# Patient Record
Sex: Female | Born: 1945 | Race: White | Hispanic: No | Smoking: Never smoker
Health system: Southern US, Community
[De-identification: ages and names within clinical notes are randomized; demographics above are authoritative.]

## PROBLEM LIST (undated history)

## (undated) DIAGNOSIS — I10 Essential (primary) hypertension: Secondary | ICD-10-CM

## (undated) DIAGNOSIS — E7800 Pure hypercholesterolemia, unspecified: Secondary | ICD-10-CM

## (undated) DIAGNOSIS — J189 Pneumonia, unspecified organism: Secondary | ICD-10-CM

## (undated) DIAGNOSIS — K219 Gastro-esophageal reflux disease without esophagitis: Secondary | ICD-10-CM

## (undated) DIAGNOSIS — R351 Nocturia: Secondary | ICD-10-CM

## (undated) DIAGNOSIS — M1990 Unspecified osteoarthritis, unspecified site: Secondary | ICD-10-CM

## (undated) DIAGNOSIS — G479 Sleep disorder, unspecified: Secondary | ICD-10-CM

## (undated) DIAGNOSIS — F419 Anxiety disorder, unspecified: Secondary | ICD-10-CM

## (undated) DIAGNOSIS — R0600 Dyspnea, unspecified: Secondary | ICD-10-CM

## (undated) DIAGNOSIS — J45909 Unspecified asthma, uncomplicated: Secondary | ICD-10-CM

## (undated) DIAGNOSIS — K649 Unspecified hemorrhoids: Secondary | ICD-10-CM

## (undated) DIAGNOSIS — E119 Type 2 diabetes mellitus without complications: Secondary | ICD-10-CM

## (undated) DIAGNOSIS — M2550 Pain in unspecified joint: Secondary | ICD-10-CM

## (undated) DIAGNOSIS — C801 Malignant (primary) neoplasm, unspecified: Secondary | ICD-10-CM

## (undated) DIAGNOSIS — D649 Anemia, unspecified: Secondary | ICD-10-CM

## (undated) DIAGNOSIS — E039 Hypothyroidism, unspecified: Secondary | ICD-10-CM

## (undated) HISTORY — PX: BACK SURGERY: SHX140

## (undated) HISTORY — PX: THYROIDECTOMY, PARTIAL: SHX18

## (undated) HISTORY — PX: ABDOMINAL HYSTERECTOMY: SHX81

## (undated) HISTORY — PX: PARS PLANA REPAIR OF RETINAL DEATACHMENT: SHX2165

## (undated) HISTORY — PX: CHOLECYSTECTOMY: SHX55

## (undated) HISTORY — PX: EYE SURGERY: SHX253

---

## 1998-11-07 ENCOUNTER — Other Ambulatory Visit: Admission: RE | Admit: 1998-11-07 | Discharge: 1998-11-07 | Payer: Self-pay | Admitting: Obstetrics & Gynecology

## 1999-10-07 ENCOUNTER — Encounter: Payer: Self-pay | Admitting: Obstetrics and Gynecology

## 1999-10-07 ENCOUNTER — Encounter: Admission: RE | Admit: 1999-10-07 | Discharge: 1999-10-07 | Payer: Self-pay | Admitting: Obstetrics and Gynecology

## 1999-10-31 ENCOUNTER — Other Ambulatory Visit: Admission: RE | Admit: 1999-10-31 | Discharge: 1999-10-31 | Payer: Self-pay | Admitting: Obstetrics and Gynecology

## 2000-10-08 ENCOUNTER — Encounter: Payer: Self-pay | Admitting: Obstetrics and Gynecology

## 2000-10-08 ENCOUNTER — Encounter: Admission: RE | Admit: 2000-10-08 | Discharge: 2000-10-08 | Payer: Self-pay | Admitting: Obstetrics and Gynecology

## 2000-11-23 ENCOUNTER — Other Ambulatory Visit: Admission: RE | Admit: 2000-11-23 | Discharge: 2000-11-23 | Payer: Self-pay | Admitting: Obstetrics and Gynecology

## 2000-12-29 ENCOUNTER — Encounter: Payer: Self-pay | Admitting: Neurosurgery

## 2000-12-29 ENCOUNTER — Encounter: Admission: RE | Admit: 2000-12-29 | Discharge: 2000-12-29 | Payer: Self-pay | Admitting: Neurosurgery

## 2001-06-13 ENCOUNTER — Emergency Department (HOSPITAL_COMMUNITY): Admission: EM | Admit: 2001-06-13 | Discharge: 2001-06-13 | Payer: Self-pay | Admitting: Emergency Medicine

## 2001-06-13 ENCOUNTER — Encounter: Payer: Self-pay | Admitting: Emergency Medicine

## 2001-09-01 ENCOUNTER — Ambulatory Visit (HOSPITAL_COMMUNITY): Admission: RE | Admit: 2001-09-01 | Discharge: 2001-09-01 | Payer: Self-pay | Admitting: *Deleted

## 2001-09-01 ENCOUNTER — Encounter (INDEPENDENT_AMBULATORY_CARE_PROVIDER_SITE_OTHER): Payer: Self-pay | Admitting: *Deleted

## 2001-10-11 ENCOUNTER — Encounter: Admission: RE | Admit: 2001-10-11 | Discharge: 2001-10-11 | Payer: Self-pay | Admitting: Obstetrics and Gynecology

## 2001-10-11 ENCOUNTER — Encounter: Payer: Self-pay | Admitting: Obstetrics and Gynecology

## 2001-11-24 ENCOUNTER — Other Ambulatory Visit: Admission: RE | Admit: 2001-11-24 | Discharge: 2001-11-24 | Payer: Self-pay | Admitting: Obstetrics and Gynecology

## 2002-07-14 HISTORY — PX: CERVICAL FUSION: SHX112

## 2002-10-18 ENCOUNTER — Encounter: Payer: Self-pay | Admitting: Obstetrics and Gynecology

## 2002-10-18 ENCOUNTER — Encounter: Admission: RE | Admit: 2002-10-18 | Discharge: 2002-10-18 | Payer: Self-pay | Admitting: Obstetrics and Gynecology

## 2002-11-04 ENCOUNTER — Encounter: Payer: Self-pay | Admitting: Neurosurgery

## 2002-11-07 ENCOUNTER — Inpatient Hospital Stay (HOSPITAL_COMMUNITY): Admission: RE | Admit: 2002-11-07 | Discharge: 2002-11-08 | Payer: Self-pay | Admitting: Neurosurgery

## 2002-11-07 ENCOUNTER — Encounter: Payer: Self-pay | Admitting: Neurosurgery

## 2003-11-13 ENCOUNTER — Encounter: Admission: RE | Admit: 2003-11-13 | Discharge: 2003-11-13 | Payer: Self-pay | Admitting: Obstetrics and Gynecology

## 2003-11-15 ENCOUNTER — Encounter: Admission: RE | Admit: 2003-11-15 | Discharge: 2003-11-15 | Payer: Self-pay | Admitting: Obstetrics and Gynecology

## 2003-11-17 ENCOUNTER — Observation Stay (HOSPITAL_COMMUNITY): Admission: RE | Admit: 2003-11-17 | Discharge: 2003-11-18 | Payer: Self-pay | Admitting: Specialist

## 2004-12-06 ENCOUNTER — Encounter: Admission: RE | Admit: 2004-12-06 | Discharge: 2004-12-06 | Payer: Self-pay | Admitting: Obstetrics and Gynecology

## 2005-04-19 ENCOUNTER — Emergency Department (HOSPITAL_COMMUNITY): Admission: EM | Admit: 2005-04-19 | Discharge: 2005-04-19 | Payer: Self-pay | Admitting: Emergency Medicine

## 2005-09-23 ENCOUNTER — Encounter: Admission: RE | Admit: 2005-09-23 | Discharge: 2005-09-23 | Payer: Self-pay | Admitting: Occupational Medicine

## 2005-10-02 ENCOUNTER — Encounter: Admission: RE | Admit: 2005-10-02 | Discharge: 2005-12-31 | Payer: Self-pay | Admitting: *Deleted

## 2005-12-19 ENCOUNTER — Encounter: Admission: RE | Admit: 2005-12-19 | Discharge: 2005-12-19 | Payer: Self-pay | Admitting: Obstetrics and Gynecology

## 2006-02-19 ENCOUNTER — Encounter: Admission: RE | Admit: 2006-02-19 | Discharge: 2006-02-19 | Payer: Self-pay | Admitting: Family Medicine

## 2006-04-23 ENCOUNTER — Encounter: Admission: RE | Admit: 2006-04-23 | Discharge: 2006-07-22 | Payer: Self-pay | Admitting: Family Medicine

## 2006-12-28 ENCOUNTER — Encounter: Admission: RE | Admit: 2006-12-28 | Discharge: 2006-12-28 | Payer: Self-pay | Admitting: Obstetrics and Gynecology

## 2007-11-19 ENCOUNTER — Encounter: Admission: RE | Admit: 2007-11-19 | Discharge: 2007-11-19 | Payer: Self-pay | Admitting: Gastroenterology

## 2007-12-30 ENCOUNTER — Encounter: Admission: RE | Admit: 2007-12-30 | Discharge: 2007-12-30 | Payer: Self-pay | Admitting: Family Medicine

## 2008-01-31 ENCOUNTER — Emergency Department (HOSPITAL_COMMUNITY): Admission: EM | Admit: 2008-01-31 | Discharge: 2008-01-31 | Payer: Self-pay | Admitting: Emergency Medicine

## 2008-05-05 ENCOUNTER — Encounter: Admission: RE | Admit: 2008-05-05 | Discharge: 2008-05-05 | Payer: Self-pay | Admitting: Cardiology

## 2008-05-12 ENCOUNTER — Ambulatory Visit (HOSPITAL_COMMUNITY): Admission: RE | Admit: 2008-05-12 | Discharge: 2008-05-12 | Payer: Self-pay | Admitting: Cardiology

## 2009-01-01 ENCOUNTER — Encounter: Admission: RE | Admit: 2009-01-01 | Discharge: 2009-01-01 | Payer: Self-pay | Admitting: Family Medicine

## 2010-01-02 ENCOUNTER — Encounter: Admission: RE | Admit: 2010-01-02 | Discharge: 2010-01-02 | Payer: Self-pay | Admitting: Family Medicine

## 2010-01-09 ENCOUNTER — Encounter: Admission: RE | Admit: 2010-01-09 | Discharge: 2010-01-09 | Payer: Self-pay | Admitting: Family Medicine

## 2010-08-25 ENCOUNTER — Emergency Department (HOSPITAL_COMMUNITY): Payer: No Typology Code available for payment source

## 2010-08-25 ENCOUNTER — Emergency Department (HOSPITAL_COMMUNITY)
Admission: EM | Admit: 2010-08-25 | Discharge: 2010-08-25 | Disposition: A | Discharge status: Home / Self Care | Payer: No Typology Code available for payment source | Attending: Emergency Medicine | Admitting: Emergency Medicine

## 2010-08-25 DIAGNOSIS — E039 Hypothyroidism, unspecified: Secondary | ICD-10-CM | POA: Insufficient documentation

## 2010-08-25 DIAGNOSIS — R071 Chest pain on breathing: Secondary | ICD-10-CM | POA: Insufficient documentation

## 2010-08-25 DIAGNOSIS — F329 Major depressive disorder, single episode, unspecified: Secondary | ICD-10-CM | POA: Insufficient documentation

## 2010-08-25 DIAGNOSIS — Z79899 Other long term (current) drug therapy: Secondary | ICD-10-CM | POA: Insufficient documentation

## 2010-08-25 DIAGNOSIS — F3289 Other specified depressive episodes: Secondary | ICD-10-CM | POA: Insufficient documentation

## 2010-08-25 DIAGNOSIS — M79609 Pain in unspecified limb: Secondary | ICD-10-CM | POA: Insufficient documentation

## 2010-08-25 DIAGNOSIS — S20219A Contusion of unspecified front wall of thorax, initial encounter: Secondary | ICD-10-CM | POA: Insufficient documentation

## 2010-08-25 DIAGNOSIS — S5010XA Contusion of unspecified forearm, initial encounter: Secondary | ICD-10-CM | POA: Insufficient documentation

## 2010-08-25 DIAGNOSIS — Z9889 Other specified postprocedural states: Secondary | ICD-10-CM | POA: Insufficient documentation

## 2010-08-25 DIAGNOSIS — Y929 Unspecified place or not applicable: Secondary | ICD-10-CM | POA: Insufficient documentation

## 2010-11-26 NOTE — Cardiovascular Report (Signed)
NAMEESSANCE, GATTI            ACCOUNT NO.:  [id number]   MEDICAL RECORD NO.:  [id number]          PATIENT TYPE:  OIB   LOCATION:  2899                         FACILITY:  [facility]   PHYSICIAN:  Jake Bathe, MD      DATE OF BIRTH:  Jun 05, 1946   DATE OF PROCEDURE:  05/12/2008  DATE OF DISCHARGE:  05/12/2008                            CARDIAC CATHETERIZATION   PROCEDURES:  1. Left heart catheterization.  2. Selective coronary angiography.  3. Left ventriculogram.  4. Aortogram.  5. Right heart catheterization with cardiac outputs and saturations.   INDICATIONS:  A 65 year old female with diabetes, hyperlipidemia, and  hypothyroidism who complained of new onset shortness of breath.  After  one flight of stairs, she was ready to drop.  No description of chest  pain.  She had a prior IV DYE allergy with the retinal angiogram.  She  also has an early family history of coronary artery disease.  Prior to  procedure, prednisone was administered 4 doses prior as well as IV  Benadryl.  She displayed no evidence of allergic reaction.   PROCEDURE IN DETAIL:  Informed consent was obtained.  Risk and benefits  were explained to the patient at length including stroke, heart attack,  death, bleeding, and renal impairment.  She was placed on the  catheterization table and prepped in a sterile fashion.  Lidocaine 1%  was used for local anesthesia.  The femoral head was visualized under  fluoroscopic guidance.  Using the modified Seldinger technique, a 7-  French sheath was placed in the right femoral vein and a 5-French sheath  was placed in the right femoral artery.  A Swan balloon-tipped catheter  was then used to perform a right heart catheterization in sequential  order of chambers in the right heart as well as out to the pulmonary  wedge position.  Saturations were drawn in the pulmonary artery as well  as femoral artery simultaneously.  She also had a saturation drawn in  the right atrium.   Cardiac outputs were obtained using both the Fick  method and the thermodilution method.  Following this procedure, the  Swan catheter was removed.  The 5-French sheath in the right femoral  artery was flushed and #4 left catheter was then used to selectively  cannulate the left main artery.  Multiple views with hand injection of  Omnipaque were obtained.  This catheter was then exchanged for a Judkins  left #4 catheter 5-French, which was used to selectively cannulate the  right coronary artery.  Multiple views with hand injection were  obtained.  This catheter was then exchanged for an angled pigtail, which  was used to cross into the left ventricle.  Hemodynamics were obtained.  A left ventriculogram utilizing 30 mL of contrast in the RAO position  was obtained.  This catheter was then brought back across the aortic  valve and hemodynamics were obtained.  This catheter was then brought to  the level of L1 and a nonselective aortogram was performed with careful  attention paid to the renal arteries.  A 30 mL of dye was used for this.  Following the procedure, both sheaths were removed.  Manual compression  was held.  The patient was hemodynamically stable and tolerated the  procedure well.  Findings were discussed with the patient at length.   FINDINGS:  1. Left main artery:  Left main artery long branch and no evidence of      any coronary artery disease.  This artery branches into the left      anterior descending artery and the circumflex artery.  2. Left anterior descending artery:  There are 3 diagonal branches.      The first two are small in caliber and the third branch is a large      caliber vessel.  The LAD then continues to wrap around the apex.      There is no angiographically significant coronary artery disease      present.  3. Left circumflex artery:  The circumflex artery has 2 obtuse      marginal branches and covers a small territory of the lateral wall.      There  is no angiographically significant coronary artery disease.  4. Right coronary artery:  This is a very large caliber vessel,      dominant giving rise to the PDA.  There are 2 large acute marginal      branches which encompass much of the lateral wall territory.  There      is no angiographically significant coronary artery disease present.   LEFT VENTRICULOGRAM:  There are no wall motion abnormalities present.  The left ventricle is hyperdynamic with an ejection fraction of  approximately 70%.  There is no mitral regurgitation noted.   AORTOGRAM:  Aortogram demonstrated no evidence of abdominal aortic  aneurysm and no evidence of renal artery stenosis.  This was  nonselective.   HEMODYNAMICS:  Left ventricular pressure 154/90 with a left ventricular  end-diastolic pressure of 21 mmHg.  Aortic pressure was 154/90 with a  mean of 120.   RIGHT HEART CATHETERIZATION:  1. Right atrial pressure was 10/6 with a mean of 6 mmHg.  2. Right ventricular pressure - 35/4 with end-diastolic pressure of 7      mmHg.  3. Pulmonary artery 32/14 with a mean pressure of 24 mmHg.  4. Pulmonary wedge pressure - 16/15 with a mean of 12 mmHg.  5. Cardiac outputs - thermodilution method was average to 7 L/min.      Fick method calculated to 8.6 L/min.  Cardiac indices were 4.0 and      4.7 respectively.   SATURATIONS:  1. Right atrial saturation 76%.  2. Pulmonary artery saturation 69%.  3. Aortic saturation 93%.  No evidence of any step-up.   IMPRESSION:  1. No angiographically significant coronary artery disease.  2. Normal left ventricular ejection fraction, hyperdynamic with      ejection fraction of 70% with no wall motion abnormalities.  3. Elevated left ventricular end-diastolic pressure of 21 mmHg.  4. Normal right-sided pressures as described above.  No evidence of      pulmonary hypertension.  5. No evidence of renal artery stenosis or abdominal aortic aneurysm.   PLAN:  With her elevated  left ventricular end-diastolic pressure, we  will treat her for diastolic dysfunction.  I will place her on low-dose  hydrochlorothiazide 12.5 mg once a day.  I may also add either  metoprolol or calcium channel blocker at clinic visit in 2 weeks.  A  possible cause for her dyspnea certainly may be diastolic dysfunction.  A further pulmonary workup may also be necessary.  Once again, no  evidence of pulmonary hypertension.      Jake Bathe, MD  Electronically Signed    MCS/MEDQ  D:  05/12/2008  T:  05/12/2008  Job:  [id number]   cc:   Juluis Rainier, M.D.

## 2010-11-29 NOTE — H&P (Signed)
NAME:  [name], [name] NO.:  [id number]   MEDICAL RECORD NO.:  [id number]                  PATIENT TYPE:   LOCATION:                                       FACILITY:   PHYSICIAN:  Jene Every, M.D.                 DATE OF BIRTH:   DATE OF ADMISSION:  11/17/2003  DATE OF DISCHARGE:                                HISTORY & PHYSICAL   CHIEF COMPLAINT:  Right lower extremity pain.   HISTORY:  The patient is a 65 year old female with a history of low back and  right lower extremity pain and numbness.  The patient was initially seen by  Dr. Idell Pickles and then sent to Dr. Shelle Iron for evaluation.  The patient notes her  pain is worse with activity and is reduced with laying and sitting.  The  patient did have a MRI done approximately a year ago which showed lateral  recess stenosis secondary to ligamentum flavum hypertrophy and bulging disk  at L4-5.  A repeat MRI obtained does show multi-factorial stenosis at L4-5,  describing this as moderate in nature with more narrowing on the right than  the left with disk material extending into the right foramen.  It is felt at  this point due to the progression of the patient's symptoms that she would  benefit from a lumbar decompression at the L4-5 level.  The risks and  benefits of surgery were discussed with the patient, she wishes to proceed.   PAST MEDICAL HISTORY:  1. Hypothyroidism.  2. Hypercholesterolemia.  3. Panic attacks.   CURRENT MEDICATIONS:  1. Levoxyl 50 mcg one p.o. daily.  2. Zocor 40 mg one p.o. daily.  3. Zoloft 50 mg one p.o. daily.  4. Alprazolam 2 mg one p.o. daily.   ALLERGIES:  MORPHINE causes nausea.   PAST SURGICAL HISTORY:  1. Hysterectomy.  2. Partial thyroidectomy.  3. Cervical fusion in 2004.  4. Laparoscopic cholecystectomy in 1985.   SOCIAL HISTORY:  The patient is married.  She does not smoke or drink  alcohol.  Her husband will be her caregiver following surgery.   PRIMARY CARE PHYSICIAN:  Dellis Anes. Idell Pickles, M.D.   FAMILY HISTORY:  Significant for coronary artery disease, hypertension,  diabetes, breast cancer.   REVIEW OF SYSTEMS:  GENERAL:  The patient denies any fever, chills, night  sweats, or bleeding tendencies.  CNS:  No blurred or double vision,  seizures, headache, or paralysis.  RESPIRATORY:  The patient does note  dyspnea on exertion, however, no shortness of breath or chest pain is noted.  GENITOURINARY:  No dysuria, hematuria, or discharge.  GASTROINTESTINAL:  No  nausea, vomiting, diarrhea, constipation, melena, or bloody stools.  MUSCULOSKELETAL:  Pertinent in HPI.   PHYSICAL EXAMINATION:  VITAL SIGNS:  Pulse is 76, respiratory rate is 12,  blood pressure 105/76.  GENERAL:  This is a well-developed, well-nourished 65 year old female in  mild distress.  HEENT:  Normocephalic, atraumatic.  Pupils equal, round, reactive to light.  EOMs intact.  NECK:  Supple, no lymphadenopathy.  CHEST:  Clear to auscultation bilaterally.  No wheezes, rhonchi, or rales.  BREASTS:  Not examined, not pertinent to HPI.  GENITOURINARY:  Not examined, not pertinent to HPI.  HEART:  Regular rate and rhythm without murmurs, rubs, or gallops.  ABDOMEN:  Soft, nontender, nondistended, bowel sounds x4.  SKIN:  No rashes or lesions are noted.  EXTREMITIES:  The patient is tender to palpation along the lumbosacral  junction.  She does have a positive straight leg raise on the right.  EHL is  5/-5 on the right.   IMPRESSION:  Spinal stenosis at L4-5.   PLAN:  The patient will be admitted to [facility] to undergo a  lumbar decompression at the L4-5 level.     Roma Schanz, P.A.                   Jene Every, M.D.    CS/MEDQ  D:  11/10/2003  T:  11/10/2003  Job:  [id number]

## 2010-11-29 NOTE — Op Note (Signed)
NAME:  [name], [name]                      ACCOUNT NO.:  [id number]   MEDICAL RECORD NO.:  [id number]                   PATIENT TYPE:  AMB   LOCATION:  DAY                                  FACILITY:  [facility]   PHYSICIAN:  Jene Every, M.D.                 DATE OF BIRTH:  Sep 15, 1945   DATE OF PROCEDURE:  11/17/2003  DATE OF DISCHARGE:                                 OPERATIVE REPORT   PREOPERATIVE DIAGNOSES:  Spinal stenosis, lateral recess stenosis L4-5  bilaterally.   POSTOPERATIVE DIAGNOSES:  Spinal stenosis, lateral recess stenosis L4-5  bilaterally.   PROCEDURE:  Bilateral recess decompression, foraminotomies at L5,  microdiskectomy L4-5.   ANESTHESIA:  General.   ASSISTANT:  Roma Schanz, P.A.   BRIEF HISTORY:  This is a 65 year old with bilateral lower extremity  radicular pain and MRI indicating lateral recess stenosis and a central disk  protrusion at L4-5. The patient had predominantly right lower extremity  radicular pain and then developed left lower extremity radicular pain. Due  to the bilateral symptomatology she was indicated for bilateral  decompression and microdiskectomy.  It was felt that for the need of  microdiskectomy and the bilateral decompression that bilateral  hemilaminotomies to preserve the intraspinous ligament would be optimal to  prevent instability. She had a mild scoliosis noted as well.  The risks and  benefits were discussed including bleeding, infection, injury to vascular  structures, CSF leakage, epidural fibrosis, adjacent segment disease, need  for fusion in the future, recurrent stenosis, etc.   TECHNIQUE:  The patient in the supine position after the induction of  adequate general anesthesia and 1 g of Kefzol, the lumbar region was prepped  and draped in the usual sterile fashion. She was placed on the [facility]  frame, all bony prominences well padded.  An 18 gauge spinal needle was  utilized to localize the L4-5 interspace  and confirmed with x-ray. An  incision was made from the spinous process of L4 to L5, subcutaneous tissue  was dissected, electrocautery utilized to achieve hemostasis. The  dorsolumbar fascia was identified and divided in line with the skin  incision, paraspinous muscle elevated from the lamina of 4 and 5.  The  McCullough retractor was placed, a second confirmatory radiograph obtained  with Penfield 4's in the intralaminar space just below the disk space.  First hemilaminotomies were performed starting on the right.  The  hemilaminotomy at the caudad edge of 4 with a 2 and a 3 mm Kerrison enlarged  with a 4 mm Kerrison, continuing cephalad to above the disk space laterally  to detach the insertion of the ligamentum flavum cephalad.  Went out  laterally to the medial border of the pedicle.  I then detached the  ligamentum flavum from the cephalad edge of 5.  Foraminotomy of L5 was  then  performed with a 2 and a 3 mm Kerrison. The ligamentum flavum was then  removed from the interspace.  Next, there was lateral recess stenosis that  was fairly significant.  The contralateral side was decompressed as well.  In a similar fashion, I  performed the hemilaminotomy in the caudad edge of  4 to above the disk space preserving the pars and we did this bilaterally.  Detached the ligamentum flavum from the cephalad edge of 5, performed  foraminotomies of 5, hemilaminotomy of the cephalad edge of 5.  I  decompressed the lateral recess, came down to the medial border of the  pedicle.  Removed the ligamentum flavum from the interspace meticulously  preserving the neural elements at all times.  I then examined just lateral  to the thecal sac, the disk bilaterally. There was a __________ right disk  protrusion that was in on the right.  We gently mobilized the thecal sac  medially with a Penfield 4.  I performed the annulotomy with a #15 blade and  a copious portion of the disk material was removed from  the disk space.  The  thecal sac was released after each pass.  Diskectomy was performed. There  was a hardened disk noted central and paracentral to the right with  osteophytes.  Following this diskectomy on the right, we decompressed fully  the nerve root. I then examined the left side and there was a disk  protrusion there noted as well, annulotomy was performed and copious portion  of disk material removed from the disk space piecemeal with a straight  pituitary and a micropituitary.  The nerve hook was utilized to mobilize the  disk and this was removed with the pituitary.  Full diskectomy of the  herniated material was performed. Bipolar electrocautery was utilized to  achieve hemostasis.  Examined again on the right side protecting the thecal  sac with a Penfield 4, I entered it with a small pituitary, there was no  residual disk herniation noted. The hockey stick edge placed cephalad to the  disk space on the right and on the left and found to be widely patent. The  foramina of 4 and 5 were widely patent as well. There was no evidence of  active bleeding. The patient was copiously irrigated, the thecal sac was  well decompressed. This was a lateral recess in the L5 nerve roots.  Next,  the wound was copiously irrigated, the disk space copiously irrigated. The  McCullough retractor was removed, paraspinous muscles inspected with no  evidence of active bleeding.  The dorsolumbar fascia  was reapproximated bilaterally with #1 Vicryl interrupted figure-of-eight  sutures.  The subcutaneous tissue was reapproximated with 2-0 Vicryl simple  sutures, the skin was reapproximated with 4-0 subcuticular Prolene, wound  was reinforced with Steri-Strips, sterile dressing applied, placed supine on  the hospital bed, extubated without difficulty and transported to the  recovery room in satisfactory condition.   The patient tolerated the procedure well with no complications. Assistant Roma Schanz, P.A.  Blood loss minimal.                                               Jene Every, M.D.    Cordelia Pen  D:  11/17/2003  T:  11/18/2003  Job:  [id number]

## 2010-11-29 NOTE — Op Note (Signed)
NAMEJANAYSIA, [name]                      ACCOUNT NO.:  [id number]   MEDICAL RECORD NO.:  [id number]                   PATIENT TYPE:  INP   LOCATION:  2899                                 FACILITY:  [facility]   PHYSICIAN:  Clydene Fake, M.D.               DATE OF BIRTH:  16-Mar-1946   DATE OF PROCEDURE:  11/07/2002  DATE OF DISCHARGE:                                 OPERATIVE REPORT   PREOPERATIVE DIAGNOSIS:  Spinal stenosis and herniated nucleus pulposus at  C4-5, C5-6, and C6-7, with left-sided radiculopathy.   POSTOPERATIVE DIAGNOSIS:  Spinal stenosis and herniated nucleus pulposus at  C4-5, C5-6, and C6-7, with left-sided radiculopathy.   PROCEDURE:  Anterior cervical decompression, diskectomy, and fusion, C4-5,  C5-6, and C6-7, with LifeNet allograft bone and Tether anterior cervical  plate.   SURGEON:  Clydene Fake, M.D.   ASSISTANT:  Danae Orleans. Venetia Maxon, M.D.   ANESTHESIA:  General endotracheal tube anesthesia.   ESTIMATED BLOOD LOSS:  Minimal.   BLOOD REPLACED:  None.   DRAINS:  None.   COMPLICATIONS:  None.   INDICATION FOR PROCEDURE:  The patient is a 65 year old woman who has had on  and off neck problems over the years but over the last couple of months she  has had more and more neck pain, radiating down the left side of the  shoulder down the left arm into the thumb and middle fingers associated with  numbness and weakness in that arm.  MRI was done showing severe spondylosis,  bone spurs, disk herniation, and foraminal narrowing at 4-5, 5-6, 6-7, worse  on the left, especially at 5-6, 6-7, and she was brought in for  decompression and fusion.   DESCRIPTION OF PROCEDURE:  The patient was brought into the operating room  and general anesthesia was induced.  The patient was placed in Holter  traction of 10 pounds and prepped and draped in a sterile fashion.  The site  of incision was then injected with 10 mL of 1% lidocaine with epinephrine.  An  incision was then made from the midline to the anterior border of the  sternocleidomastoid muscle on the left side of the neck.  The incision was  taken down to the platysma and hemostasis obtained with Bovie cauterization.  After the incision was incised with the Bovie, blunt dissection was taken  through the anterior cervical fascia to the anterior cervical spine.  A  needle was placed in the interspace and an x-ray was obtained showing that  this was the 5-6 interspace.  This disk space was incised with a 15 blade,  partial diskectomy performed with pituitary rongeurs.  The longus colli  muscle was then reflected laterally on each side from C4 through C7, and a  self-retaining retractor was then placed at 4-5 and 5-6.  The disk space was  incised with a 15 blade and diskectomy performed with pituitary rongeurs and  osteophytes removed with Leksell rongeurs.  Distraction pins were then  placed in C4 and C6 and the interspace was distracted.  A microscope was  brought in at this point for microdissection and using a high-speed drill,  the pituitary rongeurs, 1 and 2 mm Kerrison punches, we continued the  diskectomy, removing the disk, cartilaginous end plate, and posterior  osteophytes, and performing bilateral foraminotomies.  The posterior  longitudinal ligament was removed and we opened up the foramen bilaterally  at both 4-5 and 5-6.  Left-sided 5-6 had significant stenosis.  When we were  finished, we had good decompression of the roots bilaterally at each level.  We made sure we had removed the cartilaginous end plates.  The height of the  vertebral bodies was measured with LifeNet trials.  We then used the rasp to  rough up the end plates, and we tapped in a 5 mm bone into the C5-6 space  and a 6 mm bone into the 4-5 space.  We checked posterior to the bone graft,  and there was plenty of room between bone and dura on both sides at both  levels.  The wound was irrigated with  antibiotic solution.  Distraction pin  was removed from C4 and placed into C7.  We moved our retractor down to be  centered over the 6-7 level, incised the 6-7 disk space, removing  osteophytes and did a partial diskectomy with pituitary rongeurs and  distracted the interspace at 6-7.  We then removed osteophytes and performed  diskectomy with pituitary rongeurs, curettes, and 2 mm Kerrison punches and  the high-speed drill to remove the cartilaginous end plates.  We removed  posterior osteophytes and did bilateral foraminotomies, and we found one  more pressure on the left side but we got good decompression in the roots  bilaterally along with the central canal.  When we were finished, we had  good decompression there.  This was measured for a 6 mm bone graft.  A 6 mm  LifeNet bone graft was then tapped into place, countersunk a few  millimeters, but there was room between the bone graft and dura.  We checked  that with the nerve hook.  We removed all the distraction pins and removed  the weight from the distraction.  Hemostasis in bone holes from the  distraction pins was obtained with Gelfoam and thrombin.  The wound was  irrigated with antibiotic solution, and we had good hemostasis.  A Tether  anterior cervical plate was then placed over the anterior cervical spine  with two screws placed into C4, two into C7, and then one into C5 and C6.  They were final-tightened and a lateral C-spine x-ray was obtained showing  good position of all plates and screws and bone grafts, good alignment of  the spine.  Retractors were all removed and hemostasis obtained with Gelfoam  and thrombin, which was then irrigated out and with bipolar cauterization we  had great hemostasis, and the platysma was closed with 3-0 Vicryl  interrupted suture and the subcutaneous tissue was closed with the same and  the skin closed with Benzoin and Steri-Strips.  A dressing was placed.  The patient was placed into a  soft cervical collar and transferred to the  recovery room in stable condition after being awoken from anesthesia.  Clydene Fake, M.D.    JRH/MEDQ  D:  11/07/2002  T:  11/07/2002  Job:  [id number]

## 2010-12-16 ENCOUNTER — Other Ambulatory Visit: Payer: Self-pay | Admitting: Family Medicine

## 2010-12-16 DIAGNOSIS — Z1231 Encounter for screening mammogram for malignant neoplasm of breast: Secondary | ICD-10-CM

## 2011-01-07 ENCOUNTER — Ambulatory Visit
Admission: RE | Admit: 2011-01-07 | Discharge: 2011-01-07 | Disposition: A | Discharge status: Home / Self Care | Payer: Medicare Other | Source: Ambulatory Visit | Attending: Family Medicine | Admitting: Family Medicine

## 2011-01-07 DIAGNOSIS — Z1231 Encounter for screening mammogram for malignant neoplasm of breast: Secondary | ICD-10-CM

## 2011-01-28 ENCOUNTER — Emergency Department (HOSPITAL_COMMUNITY): Payer: Medicare Other

## 2011-01-28 ENCOUNTER — Emergency Department (HOSPITAL_COMMUNITY)
Admission: EM | Admit: 2011-01-28 | Discharge: 2011-01-28 | Disposition: A | Discharge status: Home / Self Care | Payer: Medicare Other | Attending: Emergency Medicine | Admitting: Emergency Medicine

## 2011-01-28 DIAGNOSIS — R0789 Other chest pain: Secondary | ICD-10-CM | POA: Insufficient documentation

## 2011-01-28 DIAGNOSIS — R51 Headache: Secondary | ICD-10-CM | POA: Insufficient documentation

## 2011-01-28 DIAGNOSIS — R110 Nausea: Secondary | ICD-10-CM | POA: Insufficient documentation

## 2011-01-28 DIAGNOSIS — I10 Essential (primary) hypertension: Secondary | ICD-10-CM | POA: Insufficient documentation

## 2011-01-28 DIAGNOSIS — R0602 Shortness of breath: Secondary | ICD-10-CM | POA: Insufficient documentation

## 2011-01-28 DIAGNOSIS — F3289 Other specified depressive episodes: Secondary | ICD-10-CM | POA: Insufficient documentation

## 2011-01-28 DIAGNOSIS — E119 Type 2 diabetes mellitus without complications: Secondary | ICD-10-CM | POA: Insufficient documentation

## 2011-01-28 DIAGNOSIS — R0989 Other specified symptoms and signs involving the circulatory and respiratory systems: Secondary | ICD-10-CM | POA: Insufficient documentation

## 2011-01-28 DIAGNOSIS — R0609 Other forms of dyspnea: Secondary | ICD-10-CM | POA: Insufficient documentation

## 2011-01-28 DIAGNOSIS — F329 Major depressive disorder, single episode, unspecified: Secondary | ICD-10-CM | POA: Insufficient documentation

## 2011-01-28 LAB — CBC
MCH: 32 pg (ref 26.0–34.0)
MCHC: 33.4 g/dL (ref 30.0–36.0)
MCV: 95.7 fL (ref 78.0–100.0)
Platelets: 208 10*3/uL (ref 150–400)
RBC: 3.94 MIL/uL (ref 3.87–5.11)

## 2011-01-28 LAB — COMPREHENSIVE METABOLIC PANEL
AST: 25 U/L (ref 0–37)
CO2: 29 mEq/L (ref 19–32)
Calcium: 9.6 mg/dL (ref 8.4–10.5)
Creatinine, Ser: 0.74 mg/dL (ref 0.50–1.10)
GFR calc Af Amer: >60 mL/min (ref >60)
GFR calc non Af Amer: >60 mL/min (ref >60)
Glucose, Bld: 100 mg/dL — ABNORMAL HIGH (ref 70–99)
Total Protein: 7.4 g/dL (ref 6.0–8.3)

## 2011-01-28 LAB — BASIC METABOLIC PANEL
BUN: 14 mg/dL (ref 6–23)
CO2: 25 mEq/L (ref 19–32)
Calcium: 9.7 mg/dL (ref 8.4–10.5)
Chloride: 101 mEq/L (ref 96–112)
Creatinine, Ser: 0.72 mg/dL (ref 0.50–1.10)
Glucose, Bld: 98 mg/dL (ref 70–99)

## 2011-01-28 LAB — URINALYSIS, ROUTINE W REFLEX MICROSCOPIC
Nitrite: NEGATIVE
Protein, ur: NEGATIVE mg/dL
Specific Gravity, Urine: 1.01 (ref 1.005–1.030)
Urobilinogen, UA: 0.2 mg/dL (ref 0.0–1.0)

## 2011-01-28 LAB — DIFFERENTIAL
Eosinophils Absolute: 0.1 10*3/uL (ref 0.0–0.7)
Lymphs Abs: 2.1 10*3/uL (ref 0.7–4.0)
Monocytes Absolute: 0.4 10*3/uL (ref 0.1–1.0)
Monocytes Relative: 5 % (ref 3–12)
Neutrophils Relative %: 68 % (ref 43–77)

## 2011-01-30 LAB — CBC

## 2011-02-01 ENCOUNTER — Emergency Department (HOSPITAL_COMMUNITY)
Admission: EM | Admit: 2011-02-01 | Discharge: 2011-02-01 | Disposition: A | Discharge status: Home / Self Care | Payer: Medicare Other | Attending: Emergency Medicine | Admitting: Emergency Medicine

## 2011-02-01 DIAGNOSIS — F29 Unspecified psychosis not due to a substance or known physiological condition: Secondary | ICD-10-CM | POA: Insufficient documentation

## 2011-02-01 DIAGNOSIS — Z79899 Other long term (current) drug therapy: Secondary | ICD-10-CM | POA: Insufficient documentation

## 2011-02-01 DIAGNOSIS — F411 Generalized anxiety disorder: Secondary | ICD-10-CM | POA: Insufficient documentation

## 2011-02-01 LAB — URINALYSIS, ROUTINE W REFLEX MICROSCOPIC
Nitrite: NEGATIVE
Specific Gravity, Urine: 1.007 (ref 1.005–1.030)
Urobilinogen, UA: 0.2 mg/dL (ref 0.0–1.0)
pH: 6 (ref 5.0–8.0)

## 2011-02-01 LAB — RAPID URINE DRUG SCREEN, HOSP PERFORMED
Cocaine: NOT DETECTED
Opiates: NOT DETECTED
Tetrahydrocannabinol: NOT DETECTED

## 2011-02-01 LAB — BASIC METABOLIC PANEL
CO2: 28 mEq/L (ref 19–32)
Calcium: 10.3 mg/dL (ref 8.4–10.5)
Chloride: 97 mEq/L (ref 96–112)
Potassium: 3.7 mEq/L (ref 3.5–5.1)
Sodium: 137 mEq/L (ref 135–145)

## 2011-02-01 LAB — DIFFERENTIAL
Basophils Absolute: 0 10*3/uL (ref 0.0–0.1)
Basophils Relative: 0 % (ref 0–1)
Neutro Abs: 8.2 10*3/uL — ABNORMAL HIGH (ref 1.7–7.7)
Neutrophils Relative %: 86 % — ABNORMAL HIGH (ref 43–77)

## 2011-02-01 LAB — CBC
Hemoglobin: 12.9 g/dL (ref 12.0–15.0)
MCHC: 33 g/dL (ref 30.0–36.0)
RBC: 4.09 MIL/uL (ref 3.87–5.11)
WBC: 9.6 10*3/uL (ref 4.0–10.5)

## 2011-02-01 LAB — ETHANOL: Alcohol, Ethyl (B): <11 mg/dL (ref 0–11)

## 2011-04-14 LAB — POCT I-STAT 3, VENOUS BLOOD GAS (G3P V)
Acid-base deficit: 4 — ABNORMAL HIGH
Bicarbonate: 17.2 — ABNORMAL LOW
O2 Saturation: 76
TCO2: 18
pCO2, Ven: 34.5 — ABNORMAL LOW
pH, Ven: 7.294

## 2011-04-14 LAB — POCT I-STAT 3, ART BLOOD GAS (G3+)
Acid-base deficit: 3 — ABNORMAL HIGH
Bicarbonate: 20.3
O2 Saturation: 93
pO2, Arterial: 65 — ABNORMAL LOW

## 2011-04-14 LAB — GLUCOSE, CAPILLARY: Glucose-Capillary: 153 — ABNORMAL HIGH

## 2011-08-22 ENCOUNTER — Other Ambulatory Visit: Payer: Self-pay | Admitting: Internal Medicine

## 2011-08-22 DIAGNOSIS — E0590 Thyrotoxicosis, unspecified without thyrotoxic crisis or storm: Secondary | ICD-10-CM

## 2011-08-22 DIAGNOSIS — E041 Nontoxic single thyroid nodule: Secondary | ICD-10-CM

## 2011-09-03 ENCOUNTER — Encounter (HOSPITAL_COMMUNITY)
Admission: RE | Admit: 2011-09-03 | Discharge: 2011-09-03 | Disposition: A | Discharge status: Home / Self Care | Payer: Medicare Other | Source: Ambulatory Visit | Attending: Internal Medicine | Admitting: Internal Medicine

## 2011-09-03 DIAGNOSIS — E0590 Thyrotoxicosis, unspecified without thyrotoxic crisis or storm: Secondary | ICD-10-CM | POA: Insufficient documentation

## 2011-09-03 DIAGNOSIS — E041 Nontoxic single thyroid nodule: Secondary | ICD-10-CM | POA: Insufficient documentation

## 2011-09-03 MED ORDER — SODIUM IODIDE I 131 CAPSULE
10.2000 | Freq: Once | INTRAVENOUS | Status: AC | PRN
  Administered 2011-09-03: 10.2 via ORAL

## 2011-09-04 ENCOUNTER — Encounter (HOSPITAL_COMMUNITY)
Admission: RE | Admit: 2011-09-04 | Discharge: 2011-09-04 | Disposition: A | Discharge status: Home / Self Care | Payer: Medicare Other | Source: Ambulatory Visit | Attending: Internal Medicine | Admitting: Internal Medicine

## 2011-09-04 DIAGNOSIS — E0500 Thyrotoxicosis with diffuse goiter without thyrotoxic crisis or storm: Secondary | ICD-10-CM | POA: Insufficient documentation

## 2011-09-04 MED ORDER — SODIUM PERTECHNETATE TC 99M INJECTION
10.0000 | Freq: Once | INTRAVENOUS | Status: AC | PRN
  Administered 2011-09-04: 10 via INTRAVENOUS

## 2011-12-23 ENCOUNTER — Other Ambulatory Visit: Payer: Self-pay | Admitting: Family Medicine

## 2011-12-23 DIAGNOSIS — Z780 Asymptomatic menopausal state: Secondary | ICD-10-CM

## 2011-12-24 ENCOUNTER — Other Ambulatory Visit: Payer: Self-pay | Admitting: Family Medicine

## 2011-12-24 DIAGNOSIS — Z1231 Encounter for screening mammogram for malignant neoplasm of breast: Secondary | ICD-10-CM

## 2011-12-24 DIAGNOSIS — Z803 Family history of malignant neoplasm of breast: Secondary | ICD-10-CM

## 2012-01-12 ENCOUNTER — Ambulatory Visit
Admission: RE | Admit: 2012-01-12 | Discharge: 2012-01-12 | Disposition: A | Discharge status: Home / Self Care | Payer: Medicare Other | Source: Ambulatory Visit | Attending: Family Medicine | Admitting: Family Medicine

## 2012-01-12 DIAGNOSIS — Z803 Family history of malignant neoplasm of breast: Secondary | ICD-10-CM

## 2012-01-12 DIAGNOSIS — Z1231 Encounter for screening mammogram for malignant neoplasm of breast: Secondary | ICD-10-CM

## 2012-01-12 DIAGNOSIS — Z780 Asymptomatic menopausal state: Secondary | ICD-10-CM

## 2012-09-16 ENCOUNTER — Other Ambulatory Visit: Payer: Self-pay | Admitting: Gastroenterology

## 2012-09-20 ENCOUNTER — Ambulatory Visit
Admission: RE | Admit: 2012-09-20 | Discharge: 2012-09-20 | Disposition: A | Discharge status: Home / Self Care | Payer: Medicare Other | Source: Ambulatory Visit | Attending: Gastroenterology | Admitting: Gastroenterology

## 2012-09-20 DIAGNOSIS — R109 Unspecified abdominal pain: Secondary | ICD-10-CM

## 2012-09-20 MED ORDER — IOHEXOL 300 MG/ML  SOLN
100.0000 mL | Freq: Once | INTRAMUSCULAR | Status: AC | PRN
  Administered 2012-09-20: 100 mL via INTRAVENOUS

## 2012-12-17 ENCOUNTER — Other Ambulatory Visit: Payer: Self-pay

## 2012-12-17 DIAGNOSIS — Z1231 Encounter for screening mammogram for malignant neoplasm of breast: Secondary | ICD-10-CM

## 2013-01-17 ENCOUNTER — Ambulatory Visit
Admission: RE | Admit: 2013-01-17 | Discharge: 2013-01-17 | Disposition: A | Discharge status: Home / Self Care | Payer: Medicare Other | Source: Ambulatory Visit

## 2013-01-17 DIAGNOSIS — Z1231 Encounter for screening mammogram for malignant neoplasm of breast: Secondary | ICD-10-CM

## 2013-12-19 ENCOUNTER — Other Ambulatory Visit (HOSPITAL_COMMUNITY): Payer: Self-pay | Admitting: Neurological Surgery

## 2013-12-19 ENCOUNTER — Other Ambulatory Visit: Payer: Self-pay | Admitting: Neurological Surgery

## 2013-12-19 DIAGNOSIS — M419 Scoliosis, unspecified: Secondary | ICD-10-CM

## 2013-12-26 ENCOUNTER — Other Ambulatory Visit: Payer: Self-pay

## 2013-12-26 DIAGNOSIS — Z1231 Encounter for screening mammogram for malignant neoplasm of breast: Secondary | ICD-10-CM

## 2014-01-06 ENCOUNTER — Encounter (HOSPITAL_COMMUNITY): Payer: Self-pay

## 2014-01-10 ENCOUNTER — Ambulatory Visit (HOSPITAL_COMMUNITY)
Admission: RE | Admit: 2014-01-10 | Discharge: 2014-01-10 | Disposition: A | Discharge status: Home / Self Care | Payer: Medicare Other | Source: Ambulatory Visit | Attending: Neurological Surgery | Admitting: Neurological Surgery

## 2014-01-10 DIAGNOSIS — M47817 Spondylosis without myelopathy or radiculopathy, lumbosacral region: Secondary | ICD-10-CM | POA: Insufficient documentation

## 2014-01-10 DIAGNOSIS — M419 Scoliosis, unspecified: Secondary | ICD-10-CM

## 2014-01-10 DIAGNOSIS — M4120 Other idiopathic scoliosis, site unspecified: Secondary | ICD-10-CM | POA: Insufficient documentation

## 2014-01-10 LAB — GLUCOSE, CAPILLARY
GLUCOSE-CAPILLARY: 98 mg/dL (ref 70–99)
GLUCOSE-CAPILLARY: 102 mg/dL — AB (ref 70–99)

## 2014-01-10 MED ORDER — HYDROCODONE-ACETAMINOPHEN 5-325 MG PO TABS
1.0000 | ORAL_TABLET | ORAL | Status: DC | PRN
  Filled 2014-01-10: qty 2

## 2014-01-10 MED ORDER — LORAZEPAM 2 MG/ML IJ SOLN
0.5000 mg | Freq: Once | INTRAMUSCULAR | Status: AC
  Administered 2014-01-10: 0.5 mg via INTRAVENOUS

## 2014-01-10 MED ORDER — ONDANSETRON HCL 4 MG/2ML IJ SOLN: 4.0000 mg | Freq: Four times a day (QID) | INTRAMUSCULAR | Status: DC | PRN

## 2014-01-10 MED ORDER — DEXTROSE-NACL 5-0.45 % IV SOLN
INTRAVENOUS | Status: DC
  Administered 2014-01-10: 13:00:00 via INTRAVENOUS

## 2014-01-10 MED ORDER — HYDROCODONE-ACETAMINOPHEN 5-325 MG PO TABS
ORAL_TABLET | ORAL | Status: AC
  Filled 2014-01-10: qty 1

## 2014-01-10 MED ORDER — ONDANSETRON HCL 4 MG/2ML IJ SOLN
4.0000 mg | Freq: Four times a day (QID) | INTRAMUSCULAR | Status: DC | PRN
  Administered 2014-01-10: 4 mg via INTRAVENOUS

## 2014-01-10 MED ORDER — LORAZEPAM 2 MG/ML IJ SOLN
INTRAMUSCULAR | Status: AC
  Filled 2014-01-10: qty 1

## 2014-01-10 MED ORDER — DIAZEPAM 5 MG PO TABS
10.0000 mg | ORAL_TABLET | Freq: Once | ORAL | Status: AC
  Administered 2014-01-10: 10 mg via ORAL

## 2014-01-10 MED ORDER — KETOROLAC TROMETHAMINE 30 MG/ML IJ SOLN
15.0000 mg | Freq: Once | INTRAMUSCULAR | Status: AC
  Administered 2014-01-10: 15 mg via INTRAVENOUS
  Filled 2014-01-10: qty 1

## 2014-01-10 MED ORDER — DIAZEPAM 5 MG PO TABS
ORAL_TABLET | ORAL | Status: AC
  Administered 2014-01-10: 10 mg via ORAL
  Filled 2014-01-10: qty 2

## 2014-01-10 MED ORDER — IOHEXOL 180 MG/ML  SOLN
20.0000 mL | Freq: Once | INTRAMUSCULAR | Status: AC | PRN
  Administered 2014-01-10: 16 mL via INTRATHECAL

## 2014-01-10 MED ORDER — ONDANSETRON HCL 4 MG/2ML IJ SOLN
INTRAMUSCULAR | Status: AC
  Filled 2014-01-10: qty 2

## 2014-01-10 NOTE — Procedures (Addendum)
CHIEF COMPLAINT:                                          Low back and left leg pain, right leg pain.    HISTORY OF PRESENT ILLNESS:                     [name] is a 68 year old individual who notes that she has had pain that has been very severe in her back and her lower extremities since 05/2013.  She has been seen and evaluated with an MRI of the lumbar spine, and this was performed in 08/2013.  She also had plain x-rays of her lumbar spine performed in 11/2013.    On evaluation of her films, she has approximately a 15-degree scoliosis, apex to the right, between L2 and L5.  She has evidence of advanced degenerative changes at L2-3, L3-4, and L4-5, each causing a moderately severe stenosis.  There is marked hypertrophy of the facets, particularly at the level of L3-4 and L4-5, and this accentuates the degree of stenosis that is present.  The patient notes that she has had pain in both her buttocks and lower extremities that has been quite severe.  She tells me that she had two epidural injections, neither of which seemed to help to any significant degree.  She is seen now for further evaluation.    REVIEW OF SYSTEMS:                                    Notable for wearing of glasses, cataracts, high blood pressure, high cholesterol, leg pain while walking, leg weakness, back pain, leg pain, arthritis, diabetes, thyroid disease, and anxiety and depression.  This is noted on a 14-point Review Sheet.    PAST MEDICAL HISTORY:                                Reveals that she has hypertension and diabetes, and she also has a history of rheumatoid arthritis.      Prior Operations:  Include a hysterectomy in 1986, thyroidectomy in 1986, and neck surgery in 2004.  This was performed by Dr. Hazle Coca in our office.  She also had back surgery in 2005 by Dr. Tonita Cong.      Medications and Allergies:  Her current medications include alprazolam, atorvastatin, folic acid, leflunomide, hydroxychloroquine,  lisinopril, metformin, metoprolol, prednisone, venlafaxine, Zyrtec, methotrexate, aspirin, vitamin D 2,000, and eye drops.    SOCIAL HISTORY:                                            We note that she does not smoke or use alcohol.    PHYSICAL EXAMINATION:                                Height and weight have been stable at 5 feet 4 inches and 172 pounds.      Musculoskeletal Examination - The patient stands straight and erect.  Babinski's are downgoing.    NEUROLOGICAL EXAMINATION:  Motor Examination - Motor function appears to be good in the iliopsoas and the quads.  However, the tibialis anterior seems to be weak on the left, 4/5, and is mildly weak on the right, 4/5 also.      Sensory Examination - Sensation is intact to vibration distally in the lower extremities.      Deep Tendon Reflexes - Trace in the patellae, absent in both Achilles.      Cerebellar Examination - She is able to walk on to her toes.    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  This has been fairly stable compared to films from 2010.  I note, however, that she has moderately severe stenosis with marked degeneration of the joints at L3-4 and L4-5 and also at L2-3.  I indicated that ultimately she may need to consider surgical decompression and stabilization.  To better evaluate this, I believe that myelography should be performed with flexion-extension views to evaluate for any abnormal motion across the segments of L2-3, L3-4, and L4-5.  I discussed the technique of the procedure with her and the fact that this would be done on an outpatient basis at the hospital.  We will plan on scheduling this at the earliest possible convenience for her, and thereafter we can discuss appropriate surgical planning which I suspect will likely require at a minimum a three-level  decompression arthrodesis from L2 to L5.    Pre op Dx: Lumbar scoliosis, lumbar stenosis Post op Dx: Lumbar scoliosis, lumbar stenosis Procedure: Lumbar myelogram Surgeon: Elsner Puncture level: L2-3 Fluid color: Clear, colorless Injection: Iohexol 180 11 cc Findings: Moderately severe spondylosis with degenerative scoliosis, stenosis worst at L2-3 L3-4 L4-5. For CT scanning

## 2014-01-10 NOTE — Discharge Instructions (Signed)
Myelography Care After These instructions give you information on caring for yourself after your procedure. Your doctor may also give you specific instructions. Call your doctor if you have any problems or questions after your procedure. HOME CARE  Rest often the first day.  When you rest, lie flat, with your head slightly raised (elevated).  Avoid heavy lifting and activity for 48 hours.  You may take the bandage (dressing) off 1 day after the test. GET HELP RIGHT AWAY IF:   You have a very bad headache.  You have a fever. MAKE SURE YOU:  Understand these instructions.  Will watch your condition.  Will get help right away if you are not doing well or get worse. Document Released: 04/08/2008 Document Revised: 06/16/2012 Document Reviewed: 03/24/2012 [facility] Patient Information 2015 [place], [place]. This information is not intended to replace advice given to you by your health care provider. Make sure you discuss any questions you have with your health care provider.

## 2014-01-10 NOTE — Progress Notes (Addendum)
Dr. Ellene Route at bedside to assess pt. Order received for IV ativan for anxiety, med given as ordered. Will monitor.

## 2014-01-10 NOTE — Progress Notes (Signed)
NO C/O NAUSEA OR PAIN

## 2014-01-10 NOTE — Progress Notes (Signed)
Dr. Ellene Route notified of pt being nauseated; orders followed for PIV and IVF with Zofran for N/V. Order received for IV pain med.  Meds given per orders. Will monitor pt.

## 2014-01-10 NOTE — Progress Notes (Signed)
Pt states allergic to some dye used for retnia surgery, developed rash with difficulty breathing. However has had procedures since then with other dyes and had no reactions.  Spoke with Ronalee Red in radiology she states she will notify radiologist.

## 2014-01-12 ENCOUNTER — Other Ambulatory Visit: Payer: Self-pay | Admitting: Neurological Surgery

## 2014-01-18 ENCOUNTER — Ambulatory Visit
Admission: RE | Admit: 2014-01-18 | Discharge: 2014-01-18 | Disposition: A | Discharge status: Home / Self Care | Payer: Medicare Other | Source: Ambulatory Visit

## 2014-01-18 DIAGNOSIS — Z1231 Encounter for screening mammogram for malignant neoplasm of breast: Secondary | ICD-10-CM

## 2014-01-20 ENCOUNTER — Other Ambulatory Visit: Payer: Self-pay | Admitting: Obstetrics & Gynecology

## 2014-01-20 DIAGNOSIS — N63 Unspecified lump in unspecified breast: Secondary | ICD-10-CM

## 2014-01-26 ENCOUNTER — Ambulatory Visit
Admission: RE | Admit: 2014-01-26 | Discharge: 2014-01-26 | Disposition: A | Discharge status: Home / Self Care | Payer: Medicare Other | Source: Ambulatory Visit | Attending: Obstetrics & Gynecology | Admitting: Obstetrics & Gynecology

## 2014-01-26 ENCOUNTER — Encounter (INDEPENDENT_AMBULATORY_CARE_PROVIDER_SITE_OTHER): Payer: Self-pay

## 2014-01-26 DIAGNOSIS — N63 Unspecified lump in unspecified breast: Secondary | ICD-10-CM

## 2014-02-03 ENCOUNTER — Encounter (HOSPITAL_COMMUNITY): Payer: Self-pay | Admitting: Pharmacy Technician

## 2014-02-07 ENCOUNTER — Encounter (HOSPITAL_COMMUNITY)
Admission: RE | Admit: 2014-02-07 | Discharge: 2014-02-07 | Disposition: A | Discharge status: Home / Self Care | Payer: Medicare Other | Source: Ambulatory Visit | Attending: Anesthesiology | Admitting: Anesthesiology

## 2014-02-07 ENCOUNTER — Ambulatory Visit (HOSPITAL_COMMUNITY)
Admission: RE | Admit: 2014-02-07 | Discharge: 2014-02-07 | Disposition: A | Discharge status: Home / Self Care | Payer: Medicare Other | Source: Ambulatory Visit | Attending: Neurological Surgery | Admitting: Neurological Surgery

## 2014-02-07 ENCOUNTER — Encounter (HOSPITAL_COMMUNITY): Payer: Self-pay

## 2014-02-07 DIAGNOSIS — Z01818 Encounter for other preprocedural examination: Secondary | ICD-10-CM | POA: Diagnosis not present

## 2014-02-07 HISTORY — DX: Unspecified osteoarthritis, unspecified site: M19.90

## 2014-02-07 HISTORY — DX: Pure hypercholesterolemia, unspecified: E78.00

## 2014-02-07 HISTORY — DX: Pain in unspecified joint: M25.50

## 2014-02-07 HISTORY — DX: Unspecified hemorrhoids: K64.9

## 2014-02-07 HISTORY — DX: Nocturia: R35.1

## 2014-02-07 HISTORY — DX: Hypothyroidism, unspecified: E03.9

## 2014-02-07 HISTORY — DX: Sleep disorder, unspecified: G47.9

## 2014-02-07 HISTORY — DX: Essential (primary) hypertension: I10

## 2014-02-07 HISTORY — DX: Type 2 diabetes mellitus without complications: E11.9

## 2014-02-07 HISTORY — DX: Anxiety disorder, unspecified: F41.9

## 2014-02-07 LAB — TYPE AND SCREEN
ABO/RH(D): O POS
ANTIBODY SCREEN: NEGATIVE

## 2014-02-07 LAB — CBC
HEMATOCRIT: 38.8 % (ref 36.0–46.0)
HEMOGLOBIN: 12.2 g/dL (ref 12.0–15.0)
MCH: 30.7 pg (ref 26.0–34.0)
MCHC: 31.4 g/dL (ref 30.0–36.0)
MCV: 97.5 fL (ref 78.0–100.0)
Platelets: 175 10*3/uL (ref 150–400)
RBC: 3.98 MIL/uL (ref 3.87–5.11)
RDW: 15.3 % (ref 11.5–15.5)
WBC: 7.5 10*3/uL (ref 4.0–10.5)

## 2014-02-07 LAB — BASIC METABOLIC PANEL
ANION GAP: 13 (ref 5–15)
BUN: 15 mg/dL (ref 6–23)
CO2: 27 meq/L (ref 19–32)
Calcium: 9.3 mg/dL (ref 8.4–10.5)
Chloride: 105 mEq/L (ref 96–112)
Creatinine, Ser: 0.71 mg/dL (ref 0.50–1.10)
GFR calc Af Amer: >90 mL/min (ref >90)
GFR calc non Af Amer: 87 mL/min — ABNORMAL LOW (ref >90)
Glucose, Bld: 80 mg/dL (ref 70–99)
POTASSIUM: 4.5 meq/L (ref 3.7–5.3)
SODIUM: 145 meq/L (ref 137–147)

## 2014-02-07 LAB — SURGICAL PCR SCREEN
MRSA, PCR: NEGATIVE
STAPHYLOCOCCUS AUREUS: NEGATIVE

## 2014-02-07 LAB — ABO/RH: ABO/RH(D): O POS

## 2014-02-07 NOTE — Pre-Procedure Instructions (Signed)
CYBELE MAULE  02/07/2014   Your procedure is scheduled on:  Monday August 3    Report to [facility] Admitting at 7:30 AM.    Call this number if you have problems the morning of surgery: [phone]   Remember:   Do not eat food or drink liquids after midnight.    Take these medicines the morning of surgery with A SIP OF WATER: alprazolam(xanax) if needed,cetirizine(zyrtec) if needed,flonase nasal spray,plaquenil),arava,metoprolol(Toprol-XL              Do not take any diabetic medication the morning of surgery              Stop all NSAIDS(Motrin,Adveil Ibuprofen,goody's powders,mobic   Do not wear jewelry, make-up or nail polish.  Do not wear lotions, powders, or perfumes. .  Do not shave 48 hours prior to surgery. .  Do not bring valuables to the hospital.  [facility] is not responsible  for any belongings or valuables.               Contacts, dentures or bridgework may not be worn into surgery.   Leave suitcase in the car. After surgery it may be brought to your room.   For patients admitted to the hospital, discharge time is determined by your treatment team.                  Special Instructions: See attached sheet for instructions on CHG showers or baths   Please read over the following fact sheets that you were given: Pain Booklet, Blood Transfusion Information and Surgical Site Infection Prevention

## 2014-02-12 MED ORDER — CEFAZOLIN SODIUM-DEXTROSE 2-3 GM-% IV SOLR
2.0000 g | INTRAVENOUS | Status: AC
  Administered 2014-02-13: 2 g via INTRAVENOUS
  Filled 2014-02-12: qty 50

## 2014-02-13 ENCOUNTER — Encounter (HOSPITAL_COMMUNITY): Payer: Medicare Other | Admitting: Critical Care Medicine

## 2014-02-13 ENCOUNTER — Inpatient Hospital Stay (HOSPITAL_COMMUNITY): Payer: Medicare Other | Admitting: Critical Care Medicine

## 2014-02-13 ENCOUNTER — Encounter (HOSPITAL_COMMUNITY): Payer: Self-pay

## 2014-02-13 ENCOUNTER — Inpatient Hospital Stay (HOSPITAL_COMMUNITY): Payer: Medicare Other

## 2014-02-13 ENCOUNTER — Encounter (HOSPITAL_COMMUNITY)
Admission: RE | Disposition: A | Discharge status: Skilled Nursing Facility | Payer: Medicare Other | Source: Ambulatory Visit | Attending: Neurological Surgery

## 2014-02-13 ENCOUNTER — Inpatient Hospital Stay (HOSPITAL_COMMUNITY)
Admission: RE | Admit: 2014-02-13 | Discharge: 2014-02-17 | DRG: 460 | Disposition: A | Discharge status: Skilled Nursing Facility | Payer: Medicare Other | Source: Ambulatory Visit | Attending: Neurological Surgery | Admitting: Neurological Surgery

## 2014-02-13 DIAGNOSIS — E119 Type 2 diabetes mellitus without complications: Secondary | ICD-10-CM | POA: Diagnosis present

## 2014-02-13 DIAGNOSIS — I10 Essential (primary) hypertension: Secondary | ICD-10-CM | POA: Diagnosis present

## 2014-02-13 DIAGNOSIS — M419 Scoliosis, unspecified: Secondary | ICD-10-CM | POA: Diagnosis present

## 2014-02-13 DIAGNOSIS — M48061 Spinal stenosis, lumbar region without neurogenic claudication: Principal | ICD-10-CM | POA: Diagnosis present

## 2014-02-13 DIAGNOSIS — M069 Rheumatoid arthritis, unspecified: Secondary | ICD-10-CM | POA: Diagnosis present

## 2014-02-13 DIAGNOSIS — D62 Acute posthemorrhagic anemia: Secondary | ICD-10-CM | POA: Diagnosis not present

## 2014-02-13 DIAGNOSIS — M549 Dorsalgia, unspecified: Secondary | ICD-10-CM | POA: Diagnosis present

## 2014-02-13 DIAGNOSIS — M4180 Other forms of scoliosis, site unspecified: Secondary | ICD-10-CM | POA: Diagnosis present

## 2014-02-13 LAB — GLUCOSE, CAPILLARY
GLUCOSE-CAPILLARY: 181 mg/dL — AB (ref 70–99)
Glucose-Capillary: 87 mg/dL (ref 70–99)

## 2014-02-13 LAB — MRSA PCR SCREENING: MRSA by PCR: NEGATIVE

## 2014-02-13 SURGERY — POSTERIOR LUMBAR FUSION 3 LEVEL
Anesthesia: General | Site: Spine Lumbar

## 2014-02-13 MED ORDER — ALPRAZOLAM 0.5 MG PO TABS
1.0000 mg | ORAL_TABLET | Freq: Two times a day (BID) | ORAL | Status: DC | PRN
  Administered 2014-02-15 – 2014-02-17 (×5): 1 mg via ORAL
  Filled 2014-02-13 (×6): qty 2

## 2014-02-13 MED ORDER — BUPIVACAINE HCL (PF) 0.5 % IJ SOLN
INTRAMUSCULAR | Status: DC | PRN
  Administered 2014-02-13: 7 mL
  Administered 2014-02-13: 23 mL

## 2014-02-13 MED ORDER — ARTIFICIAL TEARS OP OINT
TOPICAL_OINTMENT | OPHTHALMIC | Status: DC | PRN
  Administered 2014-02-13: 1 via OPHTHALMIC

## 2014-02-13 MED ORDER — KETOROLAC TROMETHAMINE 30 MG/ML IJ SOLN
INTRAMUSCULAR | Status: AC
  Administered 2014-02-13: 30 mg
  Filled 2014-02-13: qty 1

## 2014-02-13 MED ORDER — SERTRALINE HCL 50 MG PO TABS
50.0000 mg | ORAL_TABLET | Freq: Every day | ORAL | Status: DC
  Administered 2014-02-14 – 2014-02-17 (×4): 50 mg via ORAL
  Filled 2014-02-13 (×5): qty 1

## 2014-02-13 MED ORDER — LIDOCAINE HCL (CARDIAC) 20 MG/ML IV SOLN
INTRAVENOUS | Status: AC
  Filled 2014-02-13: qty 5

## 2014-02-13 MED ORDER — ROCURONIUM BROMIDE 100 MG/10ML IV SOLN
INTRAVENOUS | Status: DC | PRN
  Administered 2014-02-13: 50 mg via INTRAVENOUS

## 2014-02-13 MED ORDER — ONDANSETRON HCL 4 MG/2ML IJ SOLN
4.0000 mg | INTRAMUSCULAR | Status: DC | PRN
  Administered 2014-02-14 (×2): 4 mg via INTRAVENOUS
  Filled 2014-02-13 (×2): qty 2

## 2014-02-13 MED ORDER — METHOCARBAMOL 1000 MG/10ML IJ SOLN
500.0000 mg | Freq: Four times a day (QID) | INTRAVENOUS | Status: DC | PRN
  Filled 2014-02-13: qty 5

## 2014-02-13 MED ORDER — OXYCODONE HCL 5 MG/5ML PO SOLN: 5.0000 mg | Freq: Once | ORAL | Status: DC | PRN

## 2014-02-13 MED ORDER — THROMBIN 20000 UNITS EX SOLR
CUTANEOUS | Status: DC | PRN
  Administered 2014-02-13: 12:00:00 via TOPICAL

## 2014-02-13 MED ORDER — LEFLUNOMIDE 20 MG PO TABS
20.0000 mg | ORAL_TABLET | Freq: Every day | ORAL | Status: DC
  Administered 2014-02-17: 20 mg via ORAL
  Filled 2014-02-13 (×4): qty 1

## 2014-02-13 MED ORDER — KETOROLAC TROMETHAMINE 15 MG/ML IJ SOLN
15.0000 mg | Freq: Four times a day (QID) | INTRAMUSCULAR | Status: AC
  Administered 2014-02-14 (×4): 15 mg via INTRAVENOUS
  Filled 2014-02-13 (×6): qty 1

## 2014-02-13 MED ORDER — PHENYLEPHRINE HCL 10 MG/ML IJ SOLN
INTRAMUSCULAR | Status: AC
  Filled 2014-02-13: qty 1

## 2014-02-13 MED ORDER — ACETAMINOPHEN 325 MG PO TABS: 650.0000 mg | ORAL_TABLET | ORAL | Status: DC | PRN

## 2014-02-13 MED ORDER — NEOSTIGMINE METHYLSULFATE 10 MG/10ML IV SOLN
INTRAVENOUS | Status: AC
  Filled 2014-02-13: qty 1

## 2014-02-13 MED ORDER — FLUTICASONE PROPIONATE 50 MCG/ACT NA SUSP: 1.0000 | Freq: Every day | NASAL | Status: DC | PRN

## 2014-02-13 MED ORDER — DEXAMETHASONE SODIUM PHOSPHATE 4 MG/ML IJ SOLN
INTRAMUSCULAR | Status: DC | PRN
  Administered 2014-02-13: 4 mg via INTRAVENOUS

## 2014-02-13 MED ORDER — METFORMIN HCL 500 MG PO TABS
500.0000 mg | ORAL_TABLET | Freq: Every day | ORAL | Status: DC
  Administered 2014-02-16 – 2014-02-17 (×2): 500 mg via ORAL
  Filled 2014-02-13 (×5): qty 1

## 2014-02-13 MED ORDER — SODIUM CHLORIDE 0.9 % IJ SOLN
3.0000 mL | Freq: Two times a day (BID) | INTRAMUSCULAR | Status: DC
  Administered 2014-02-13 – 2014-02-17 (×7): 3 mL via INTRAVENOUS

## 2014-02-13 MED ORDER — ATORVASTATIN CALCIUM 40 MG PO TABS
40.0000 mg | ORAL_TABLET | Freq: Every day | ORAL | Status: DC
  Administered 2014-02-14 – 2014-02-17 (×4): 40 mg via ORAL
  Filled 2014-02-13 (×5): qty 1

## 2014-02-13 MED ORDER — SODIUM CHLORIDE 0.9 % IV SOLN
INTRAVENOUS | Status: DC
  Administered 2014-02-14: 19:00:00 via INTRAVENOUS

## 2014-02-13 MED ORDER — HYDROMORPHONE HCL PF 1 MG/ML IJ SOLN
0.2500 mg | INTRAMUSCULAR | Status: DC | PRN
  Administered 2014-02-13 (×2): 0.25 mg via INTRAVENOUS
  Administered 2014-02-13: 0.5 mg via INTRAVENOUS

## 2014-02-13 MED ORDER — SODIUM CHLORIDE 0.9 % IJ SOLN: 3.0000 mL | INTRAMUSCULAR | Status: DC | PRN

## 2014-02-13 MED ORDER — ONDANSETRON HCL 4 MG/2ML IJ SOLN
4.0000 mg | Freq: Four times a day (QID) | INTRAMUSCULAR | Status: AC | PRN
  Administered 2014-02-13: 4 mg via INTRAVENOUS

## 2014-02-13 MED ORDER — OXYCODONE-ACETAMINOPHEN 5-325 MG PO TABS
1.0000 | ORAL_TABLET | ORAL | Status: DC | PRN
  Administered 2014-02-15 – 2014-02-17 (×5): 1 via ORAL
  Filled 2014-02-13 (×5): qty 1

## 2014-02-13 MED ORDER — GLYCOPYRROLATE 0.2 MG/ML IJ SOLN
INTRAMUSCULAR | Status: DC | PRN
  Administered 2014-02-13: 0.3 mg via INTRAVENOUS

## 2014-02-13 MED ORDER — CEFAZOLIN SODIUM-DEXTROSE 2-3 GM-% IV SOLR
INTRAVENOUS | Status: AC
  Filled 2014-02-13: qty 50

## 2014-02-13 MED ORDER — LIDOCAINE HCL (CARDIAC) 20 MG/ML IV SOLN
INTRAVENOUS | Status: DC | PRN
  Administered 2014-02-13: 80 mg via INTRAVENOUS

## 2014-02-13 MED ORDER — PREDNISONE 5 MG PO TABS
5.0000 mg | ORAL_TABLET | Freq: Every day | ORAL | Status: DC
Start: 2014-02-14 — End: 2014-02-17
  Administered 2014-02-14 – 2014-02-17 (×4): 5 mg via ORAL
  Filled 2014-02-13 (×5): qty 1

## 2014-02-13 MED ORDER — ROCURONIUM BROMIDE 50 MG/5ML IV SOLN
INTRAVENOUS | Status: AC
  Filled 2014-02-13: qty 1

## 2014-02-13 MED ORDER — BISACODYL 10 MG RE SUPP
10.0000 mg | Freq: Every day | RECTAL | Status: DC | PRN
Start: 2014-02-13 — End: 2014-02-17

## 2014-02-13 MED ORDER — FENTANYL CITRATE 0.05 MG/ML IJ SOLN
INTRAMUSCULAR | Status: DC | PRN
  Administered 2014-02-13: 50 ug via INTRAVENOUS
  Administered 2014-02-13: 100 ug via INTRAVENOUS
  Administered 2014-02-13 (×4): 50 ug via INTRAVENOUS

## 2014-02-13 MED ORDER — METOPROLOL SUCCINATE ER 25 MG PO TB24
25.0000 mg | ORAL_TABLET | Freq: Every day | ORAL | Status: DC
  Filled 2014-02-13 (×4): qty 1

## 2014-02-13 MED ORDER — PHENYLEPHRINE 40 MCG/ML (10ML) SYRINGE FOR IV PUSH (FOR BLOOD PRESSURE SUPPORT)
PREFILLED_SYRINGE | INTRAVENOUS | Status: AC
  Filled 2014-02-13: qty 10

## 2014-02-13 MED ORDER — HYDROMORPHONE HCL PF 1 MG/ML IJ SOLN
INTRAMUSCULAR | Status: AC
  Filled 2014-02-13: qty 1

## 2014-02-13 MED ORDER — FENTANYL CITRATE 0.05 MG/ML IJ SOLN
INTRAMUSCULAR | Status: AC
  Filled 2014-02-13: qty 5

## 2014-02-13 MED ORDER — ARTIFICIAL TEARS OP OINT
TOPICAL_OINTMENT | OPHTHALMIC | Status: AC
  Filled 2014-02-13: qty 3.5

## 2014-02-13 MED ORDER — MORPHINE SULFATE 2 MG/ML IJ SOLN
1.0000 mg | INTRAMUSCULAR | Status: DC | PRN
  Filled 2014-02-13: qty 1

## 2014-02-13 MED ORDER — ALBUMIN HUMAN 5 % IV SOLN
INTRAVENOUS | Status: AC
  Administered 2014-02-13: 12.5 g
  Filled 2014-02-13: qty 250

## 2014-02-13 MED ORDER — CEFAZOLIN SODIUM 1-5 GM-% IV SOLN
1.0000 g | Freq: Three times a day (TID) | INTRAVENOUS | Status: AC
  Administered 2014-02-14: 1 g via INTRAVENOUS
  Filled 2014-02-13 (×3): qty 50

## 2014-02-13 MED ORDER — GLYCOPYRROLATE 0.2 MG/ML IJ SOLN
INTRAMUSCULAR | Status: AC
  Filled 2014-02-13: qty 1

## 2014-02-13 MED ORDER — OXYCODONE HCL 5 MG PO TABS: 5.0000 mg | ORAL_TABLET | Freq: Once | ORAL | Status: DC | PRN

## 2014-02-13 MED ORDER — HYPROMELLOSE (GONIOSCOPIC) 2.5 % OP SOLN: 1.0000 [drp] | Freq: Four times a day (QID) | OPHTHALMIC | Status: DC | PRN

## 2014-02-13 MED ORDER — SENNA 8.6 MG PO TABS
1.0000 | ORAL_TABLET | Freq: Two times a day (BID) | ORAL | Status: DC
  Administered 2014-02-14 – 2014-02-17 (×6): 8.6 mg via ORAL
  Filled 2014-02-13 (×10): qty 1

## 2014-02-13 MED ORDER — PHENOL 1.4 % MT LIQD
1.0000 | OROMUCOSAL | Status: DC | PRN
  Filled 2014-02-13: qty 177

## 2014-02-13 MED ORDER — MIDAZOLAM HCL 2 MG/2ML IJ SOLN
INTRAMUSCULAR | Status: AC
  Filled 2014-02-13: qty 2

## 2014-02-13 MED ORDER — ALUM & MAG HYDROXIDE-SIMETH 200-200-20 MG/5ML PO SUSP: 30.0000 mL | Freq: Four times a day (QID) | ORAL | Status: DC | PRN

## 2014-02-13 MED ORDER — 0.9 % SODIUM CHLORIDE (POUR BTL) OPTIME
TOPICAL | Status: DC | PRN
  Administered 2014-02-13: 1000 mL

## 2014-02-13 MED ORDER — PROPOFOL 10 MG/ML IV BOLUS
INTRAVENOUS | Status: DC | PRN
  Administered 2014-02-13: 150 mg via INTRAVENOUS

## 2014-02-13 MED ORDER — FLEET ENEMA 7-19 GM/118ML RE ENEM
1.0000 | ENEMA | Freq: Once | RECTAL | Status: AC | PRN
  Filled 2014-02-13: qty 1

## 2014-02-13 MED ORDER — LACTATED RINGERS IV SOLN
INTRAVENOUS | Status: DC | PRN
  Administered 2014-02-13 (×3): via INTRAVENOUS

## 2014-02-13 MED ORDER — PROCHLORPERAZINE 25 MG RE SUPP
25.0000 mg | Freq: Two times a day (BID) | RECTAL | Status: DC | PRN
  Administered 2014-02-13: 25 mg via RECTAL
  Filled 2014-02-13: qty 1

## 2014-02-13 MED ORDER — LISINOPRIL 10 MG PO TABS
10.0000 mg | ORAL_TABLET | Freq: Every day | ORAL | Status: DC
  Filled 2014-02-13 (×5): qty 1

## 2014-02-13 MED ORDER — LORATADINE 10 MG PO TABS
10.0000 mg | ORAL_TABLET | Freq: Every day | ORAL | Status: DC
Start: 2014-02-13 — End: 2014-02-17
  Filled 2014-02-13 (×5): qty 1

## 2014-02-13 MED ORDER — SODIUM CHLORIDE 0.9 % IV SOLN
250.0000 mL | INTRAVENOUS | Status: DC
  Administered 2014-02-13: 250 mL via INTRAVENOUS

## 2014-02-13 MED ORDER — METHOCARBAMOL 500 MG PO TABS
500.0000 mg | ORAL_TABLET | Freq: Four times a day (QID) | ORAL | Status: DC | PRN
  Administered 2014-02-16: 500 mg via ORAL
  Filled 2014-02-13 (×2): qty 1

## 2014-02-13 MED ORDER — NEOSTIGMINE METHYLSULFATE 10 MG/10ML IV SOLN
INTRAVENOUS | Status: DC | PRN
  Administered 2014-02-13: 2 mg via INTRAVENOUS

## 2014-02-13 MED ORDER — PHENYLEPHRINE HCL 10 MG/ML IJ SOLN
10.0000 mg | INTRAMUSCULAR | Status: DC | PRN
  Administered 2014-02-13: 15:00:00 via INTRAVENOUS
  Administered 2014-02-13: 20 ug/min via INTRAVENOUS

## 2014-02-13 MED ORDER — HYDROXYCHLOROQUINE SULFATE 200 MG PO TABS
400.0000 mg | ORAL_TABLET | Freq: Every day | ORAL | Status: DC
  Administered 2014-02-14 – 2014-02-17 (×4): 400 mg via ORAL
  Filled 2014-02-13 (×4): qty 2

## 2014-02-13 MED ORDER — DOCUSATE SODIUM 100 MG PO CAPS
100.0000 mg | ORAL_CAPSULE | Freq: Two times a day (BID) | ORAL | Status: DC
  Administered 2014-02-14 – 2014-02-17 (×6): 100 mg via ORAL
  Filled 2014-02-13 (×9): qty 1

## 2014-02-13 MED ORDER — ONDANSETRON HCL 4 MG/2ML IJ SOLN
INTRAMUSCULAR | Status: AC
  Filled 2014-02-13: qty 2

## 2014-02-13 MED ORDER — SODIUM CHLORIDE 0.9 % IR SOLN
Status: DC | PRN
  Administered 2014-02-13: 12:00:00

## 2014-02-13 MED ORDER — MIDAZOLAM HCL 5 MG/5ML IJ SOLN
INTRAMUSCULAR | Status: DC | PRN
  Administered 2014-02-13: 2 mg via INTRAVENOUS

## 2014-02-13 MED ORDER — LIDOCAINE-EPINEPHRINE 1 %-1:100000 IJ SOLN
INTRAMUSCULAR | Status: DC | PRN
  Administered 2014-02-13: 7 mL

## 2014-02-13 MED ORDER — ACETAMINOPHEN 650 MG RE SUPP: 650.0000 mg | RECTAL | Status: DC | PRN

## 2014-02-13 MED ORDER — CEFAZOLIN SODIUM-DEXTROSE 2-3 GM-% IV SOLR
INTRAVENOUS | Status: DC | PRN
  Administered 2014-02-13: 2 g via INTRAVENOUS

## 2014-02-13 MED ORDER — POLYETHYLENE GLYCOL 3350 17 G PO PACK
17.0000 g | PACK | Freq: Every day | ORAL | Status: DC | PRN
  Filled 2014-02-13: qty 1

## 2014-02-13 MED ORDER — PHENYLEPHRINE HCL 10 MG/ML IJ SOLN
INTRAMUSCULAR | Status: DC | PRN
  Administered 2014-02-13: 120 ug via INTRAVENOUS
  Administered 2014-02-13: 40 ug via INTRAVENOUS
  Administered 2014-02-13: 80 ug via INTRAVENOUS
  Administered 2014-02-13 (×2): 40 ug via INTRAVENOUS

## 2014-02-13 MED ORDER — VENLAFAXINE HCL ER 75 MG PO CP24
112.5000 mg | ORAL_CAPSULE | Freq: Every day | ORAL | Status: DC
  Administered 2014-02-15 – 2014-02-16 (×2): 75 mg via ORAL
  Administered 2014-02-17: 112.5 mg via ORAL
  Filled 2014-02-13 (×7): qty 1

## 2014-02-13 MED ORDER — MENTHOL 3 MG MT LOZG: 1.0000 | LOZENGE | OROMUCOSAL | Status: DC | PRN

## 2014-02-13 MED ORDER — ONDANSETRON HCL 4 MG/2ML IJ SOLN
INTRAMUSCULAR | Status: DC | PRN
  Administered 2014-02-13: 4 mg via INTRAVENOUS

## 2014-02-13 SURGICAL SUPPLY — 76 items
ADH SKN CLS APL DERMABOND .7 (GAUZE/BANDAGES/DRESSINGS)
ADH SKN CLS LQ APL DERMABOND (GAUZE/BANDAGES/DRESSINGS) ×1
BAG DECANTER FOR FLEXI CONT (MISCELLANEOUS) ×3 IMPLANT
BLADE 10 SAFETY STRL DISP (BLADE) ×1 IMPLANT
BLADE SURG ROTATE 9660 (MISCELLANEOUS) IMPLANT
BONE MATRIX OSTEOCEL PRO MED (Bone Implant) ×4 IMPLANT
BUR MATCHSTICK NEURO 3.0 LAGG (BURR) ×3 IMPLANT
CAGE COROENT LG 10X9X23-12 (Cage) ×2 IMPLANT
CAGE PLIF 8X9X23-12 LUMBAR (Cage) ×8 IMPLANT
CANISTER SUCT 3000ML (MISCELLANEOUS) ×3 IMPLANT
CONT SPEC 4OZ CLIKSEAL STRL BL (MISCELLANEOUS) ×6 IMPLANT
COVER BACK TABLE 24X17X13 BIG (DRAPES) IMPLANT
COVER TABLE BACK 60X90 (DRAPES) ×3 IMPLANT
DECANTER SPIKE VIAL GLASS SM (MISCELLANEOUS) ×3 IMPLANT
DERMABOND ADHESIVE PROPEN (GAUZE/BANDAGES/DRESSINGS) ×2
DERMABOND ADVANCED (GAUZE/BANDAGES/DRESSINGS)
DERMABOND ADVANCED .7 DNX12 (GAUZE/BANDAGES/DRESSINGS) ×1 IMPLANT
DERMABOND ADVANCED .7 DNX6 (GAUZE/BANDAGES/DRESSINGS) IMPLANT
DRAPE C-ARM 42X72 X-RAY (DRAPES) ×6 IMPLANT
DRAPE LAPAROTOMY 100X72X124 (DRAPES) ×3 IMPLANT
DRAPE POUCH INSTRU U-SHP 10X18 (DRAPES) ×3 IMPLANT
DRAPE PROXIMA HALF (DRAPES) IMPLANT
DRSG OPSITE POSTOP 4X10 (GAUZE/BANDAGES/DRESSINGS) ×2 IMPLANT
DURAPREP 26ML APPLICATOR (WOUND CARE) ×3 IMPLANT
ELECT REM PT RETURN 9FT ADLT (ELECTROSURGICAL) ×3
ELECTRODE REM PT RTRN 9FT ADLT (ELECTROSURGICAL) ×1 IMPLANT
GAUZE SPONGE 4X4 16PLY XRAY LF (GAUZE/BANDAGES/DRESSINGS) ×2 IMPLANT
GLOVE BIOGEL PI IND STRL 7.0 (GLOVE) IMPLANT
GLOVE BIOGEL PI IND STRL 7.5 (GLOVE) IMPLANT
GLOVE BIOGEL PI IND STRL 8.5 (GLOVE) ×2 IMPLANT
GLOVE BIOGEL PI INDICATOR 7.0 (GLOVE) ×6
GLOVE BIOGEL PI INDICATOR 7.5 (GLOVE) ×2
GLOVE BIOGEL PI INDICATOR 8.5 (GLOVE) ×4
GLOVE ECLIPSE 6.5 STRL STRAW (GLOVE) ×2 IMPLANT
GLOVE ECLIPSE 8.5 STRL (GLOVE) ×8 IMPLANT
GLOVE EXAM NITRILE LRG STRL (GLOVE) IMPLANT
GLOVE EXAM NITRILE MD LF STRL (GLOVE) IMPLANT
GLOVE EXAM NITRILE XL STR (GLOVE) IMPLANT
GLOVE EXAM NITRILE XS STR PU (GLOVE) IMPLANT
GLOVE SS BIOGEL STRL SZ 7 (GLOVE) IMPLANT
GLOVE SUPERSENSE BIOGEL SZ 7 (GLOVE) ×4
GLOVE SURG SS PI 7.0 STRL IVOR (GLOVE) ×8 IMPLANT
GOWN STRL REUS W/ TWL LRG LVL3 (GOWN DISPOSABLE) IMPLANT
GOWN STRL REUS W/ TWL XL LVL3 (GOWN DISPOSABLE) IMPLANT
GOWN STRL REUS W/TWL 2XL LVL3 (GOWN DISPOSABLE) ×6 IMPLANT
GOWN STRL REUS W/TWL LRG LVL3 (GOWN DISPOSABLE) ×6
GOWN STRL REUS W/TWL XL LVL3 (GOWN DISPOSABLE) ×6
HEMOSTAT POWDER KIT SURGIFOAM (HEMOSTASIS) IMPLANT
KIT BASIN OR (CUSTOM PROCEDURE TRAY) ×3 IMPLANT
KIT ROOM TURNOVER OR (KITS) ×3 IMPLANT
NEEDLE HYPO 22GX1.5 SAFETY (NEEDLE) ×3 IMPLANT
NS IRRIG 1000ML POUR BTL (IV SOLUTION) ×3 IMPLANT
PACK LAMINECTOMY NEURO (CUSTOM PROCEDURE TRAY) ×3 IMPLANT
PAD ARMBOARD 7.5X6 YLW CONV (MISCELLANEOUS) ×13 IMPLANT
PATTIES SURGICAL .5 X1 (DISPOSABLE) ×3 IMPLANT
ROD PREBENT ARMADA 90MM SPINE (Rod) ×4 IMPLANT
SCREW ARMADA 6.5X50 (Screw) ×4 IMPLANT
SCREW LOCK (Screw) ×24 IMPLANT
SCREW LOCK 100X5.5X OPN (Screw) IMPLANT
SCREW POLY 45X6.5 (Screw) IMPLANT
SCREW POLY 6.5X45MM (Screw) ×18 IMPLANT
SPONGE GAUZE 4X4 12PLY (GAUZE/BANDAGES/DRESSINGS) ×1 IMPLANT
SPONGE LAP 4X18 X RAY DECT (DISPOSABLE) ×2 IMPLANT
SPONGE NEURO XRAY DETECT 1X3 (DISPOSABLE) ×2 IMPLANT
SPONGE SURGIFOAM ABS GEL 100 (HEMOSTASIS) ×3 IMPLANT
SUT VIC AB 1 CT1 18XBRD ANBCTR (SUTURE) ×1 IMPLANT
SUT VIC AB 1 CT1 8-18 (SUTURE) ×6
SUT VIC AB 2-0 CP2 18 (SUTURE) ×5 IMPLANT
SUT VIC AB 3-0 SH 8-18 (SUTURE) ×9 IMPLANT
SYR 20ML ECCENTRIC (SYRINGE) ×3 IMPLANT
SYR 3ML LL SCALE MARK (SYRINGE) ×12 IMPLANT
TOWEL OR 17X24 6PK STRL BLUE (TOWEL DISPOSABLE) ×3 IMPLANT
TOWEL OR 17X26 10 PK STRL BLUE (TOWEL DISPOSABLE) ×3 IMPLANT
TRAP SPECIMEN MUCOUS 40CC (MISCELLANEOUS) ×3 IMPLANT
TRAY FOLEY CATH 14FRSI W/METER (CATHETERS) ×3 IMPLANT
WATER STERILE IRR 1000ML POUR (IV SOLUTION) ×3 IMPLANT

## 2014-02-13 NOTE — Anesthesia Preprocedure Evaluation (Addendum)
Anesthesia Evaluation  Patient identified by MRN, date of birth, ID band Patient awake    Reviewed: Allergy & Precautions, H&P , NPO status , Patient's Chart, lab work & pertinent test results  Airway Mallampati: II  Neck ROM: full    Dental  (+) Teeth Intact, Dental Advisory Given, Caps   Pulmonary neg pulmonary ROS,          Cardiovascular hypertension,     Neuro/Psych Anxiety    GI/Hepatic   Endo/Other  diabetes, Type 2Hypothyroidism   Renal/GU      Musculoskeletal  (+) Arthritis -,   Abdominal   Peds  Hematology   Anesthesia Other Findings   Reproductive/Obstetrics                          Anesthesia Physical Anesthesia Plan  ASA: II  Anesthesia Plan: General   Post-op Pain Management:    Induction: Intravenous  Airway Management Planned: Oral ETT  Additional Equipment:   Intra-op Plan:   Post-operative Plan: Extubation in OR  Informed Consent: I have reviewed the patients History and Physical, chart, labs and discussed the procedure including the risks, benefits and alternatives for the proposed anesthesia with the patient or authorized representative who has indicated his/her understanding and acceptance.     Plan Discussed with: CRNA, Anesthesiologist and Surgeon  Anesthesia Plan Comments:         Anesthesia Quick Evaluation

## 2014-02-13 NOTE — Anesthesia Postprocedure Evaluation (Signed)
Anesthesia Post Note  Patient: [name]  Procedure(s) Performed: Procedure(s) (LRB): lUMBAR TWO-THREE,LUMBAR THREE-FOUR,LUMBAR FOUR-FIVE LAMINECTOMY /FUSION WITH POSTERIOR LUMBAR INTERBODY FUSION/SEGMENTAL FIXATION/WITH  RIGHT LUMBAR FIVE-SACRAL ONE LAMINOTOMY (N/A)  Anesthesia type: General  Patient location: PACU  Post pain: Pain level controlled and Adequate analgesia  Post assessment: Post-op Vital signs reviewed, Patient's Cardiovascular Status Stable, Respiratory Function Stable, Patent Airway and Pain level controlled  Last Vitals:  Filed Vitals:   02/13/14 1715  BP: 101/44  Pulse: 60  Temp:   Resp: 25    Post vital signs: Reviewed and stable  Level of consciousness: awake, alert  and oriented  Complications: No apparent anesthesia complications

## 2014-02-13 NOTE — Transfer of Care (Signed)
Immediate Anesthesia Transfer of Care Note  Patient: [name]  Procedure(s) Performed: Procedure(s): lUMBAR TWO-THREE,LUMBAR THREE-FOUR,LUMBAR FOUR-FIVE LAMINECTOMY /FUSION WITH POSTERIOR LUMBAR INTERBODY FUSION/SEGMENTAL FIXATION/WITH  RIGHT LUMBAR FIVE-SACRAL ONE LAMINOTOMY (N/A)  Patient Location: PACU  Anesthesia Type:General  Level of Consciousness: awake, alert  and oriented  Airway & Oxygen Therapy: Patient Spontanous Breathing and Patient connected to nasal cannula oxygen  Post-op Assessment: Report given to PACU RN, Post -op Vital signs reviewed and stable and Patient moving all extremities  Post vital signs: Reviewed and stable  Complications: No apparent anesthesia complications

## 2014-02-13 NOTE — Op Note (Signed)
Date of surgery: 02/13/2014 Preoperative diagnosis: Lumbar scoliosis, lumbar stenosis, L2-3 L3-4 L4-5. L5 radiculopathy. Postoperative diagnosis: Lumbar scoliosis, lumbar stenosis, L2-3 L3-4 L4-5. L5 radiculopathy Procedure: Lumbar laminectomy L2-L3 and L4 with decompression of L2 L3-L4 and L5 nerve roots with more work than require for simple interbody arthrodesis. Posterior lumbar interbody arthrodesis L2-3 L3-4 L4-5 peek spacers local autograft and allograft placement. Posterior lateral arthrodesis L2-L5 with morcellized autograft and allograft. Pedicular fixation L2-L3 L4-L5 bilaterally with nuvasive pedicle screws and rods. Surgeon: Kristeen Miss First assistant: Dayton Bailiff Anesthesia: Gen. Endotracheal Indications: [name] is a 68 year old individual who's had significant problems with back and bilateral lower extremity pain she has evidence of severe spondylitic stenosis L2-3 L3-4 and L4-5 with a degenerative scoliosis that has been accelerating. She has a right and left lumbar radiculopathy. She is to undergo surgical decompression. Stabilization will also be performed.  Procedure: The patient was brought to the operating room supine on a stretcher. After the smooth induction of general endotracheal anesthesia, she was turned prone. The back was prepped with alcohol and DuraPrep and draped in a sterile fashion. A midline incision was created and carried down to the lumbar dorsal fascia which was opened on either side of the midline. Subperiosteal dissection was performed to expose several spinous processes. These were identified positively on the radiograph as L2 and L3. The dissection was then carried out to the lateral aspects of L2 over the facet joints at L2-L3 L3-L4 and L4-L5. These areas were packed off and decorticated for later use in grafting. Laminectomy was in performed removing the entire laminar arch and facet joints at L4 and L3 L2 had bilateral laminectomies performed  removing the entirety of the facet joint but leaving a small piece of the laminar arch and spinous process for its articulation with the L1. The common dural tube was then carefully decompressed and each of the individual nerve roots were then inspected. There was substantial stenosis for the L2 nerve roots in the lateral recesses and the foramen bilaterally which was decompressed with a 2 and 3 mm Kerrison punch same process was performed at L3 for the L3 nerve roots at L4 for the L4 nerve roots at L5 for the L5 nerve roots on the right side there was noted be foraminal stenosis at L5-S1 primarily involving the L5 nerve root. A laminotomy was created at L5-S1 on the right side and the superior portion of the laminotomy defect was decompressed to allow for passage of the L5 nerve root. Once the decompression of the nerve roots was completed, the interspaces were prepared for posterior lumbar interbody arthrodesis using peek spacers.  Complete discectomy was performed first at L2-3. An 8 mm 12 lordotic spacer could be placed into the interspace once the endplates were completely decorticated and decompressed and the interspace was placed under some tension with an interbody spreader. 28 mm 12 23 mm long spacers were placed into the interspace along with a combination of autograft and allograft which was mixed with osseous cell. Once this place was packed with bone graft in the spacers were placed attention was turned at L3-L4 where the right side was noted to be more collapsed. Here a singular 10 mm 12 23 mm long spacer was placed on the left side which was distracted using an interspace spreader. Accommodation of curettes and rongeurs was used to distract and prepare the endplates for grafting. Autograft was packed into this interspace with 2  3 cc syringe of autograft. Next L4-5  was prepared and here a 10 mm 12 23 mm long spacer could be placed in each side once the interspace was prepared by decorticating  the endplates and distracting the interspace adequately. The grafts were placed well ventrally. Another 6 cc of autograft and allograft was placed into the interspace. Attention was then turned to pedicle screw placement at L. to 6.5 x 50 mm screws were placed after confirming the position of the pedicle screw placement using fluoroscopy and predrilling and tapping checking for cut out and placing 250 mm screws at L3 6.5 x 45 mm screws were placed in a similar fashion at L4 6.5 x 45 mm screws were placed in L5 6.5 x 45 mm screws were placed all using a similar technique. 90 mm precontoured rods were then used to connect the screw heads together and also to advance a little bit of lordosis and some compression of the posterior elements. This was verified radiographically and a good decompression was obtained along with a good alignment of the vertebrae. AP imaging confirms some reduction of the scoliosis.  Lateral gutters were then packed with the remainder of the bone graft and autograft. Hemostasis was then checked and the pads of the L2 the L3 the L4 and L5 nerve roots were each checked to make her an adequate decompression had been completed once this was verified the lumbar dorsal fascia was closed with #1 Vicryl 2-0 Vicryl was used in the subcutaneous tissues and 3-0 Vicryl is used to close subcuticular skin blood loss was estimated at 400 cc, no Cell Saver blood was returned to the patient. 20 cc of half percent Marcaine was injected into the paraspinous fascia during the closure.

## 2014-02-13 NOTE — H&P (Signed)
CHIEF COMPLAINT:                                          Low back and left leg pain, right leg pain.    HISTORY OF PRESENT ILLNESS:                     [name] is a 68 year old individual who notes that she has had pain that has been very severe in her back and her lower extremities since 05/2013.  She has been seen and evaluated with an MRI of the lumbar spine, and this was performed in 08/2013.  She also had plain x-rays of her lumbar spine performed in 11/2013.    On evaluation of her films, she has approximately a 15-degree scoliosis, apex to the right, between L2 and L5.  She has evidence of advanced degenerative changes at L2-3, L3-4, and L4-5, each causing a moderately severe stenosis.  There is marked hypertrophy of the facets, particularly at the level of L3-4 and L4-5, and this accentuates the degree of stenosis that is present.  The patient notes that she has had pain in both her buttocks and lower extremities that has been quite severe.  She tells me that she had two epidural injections, neither of which seemed to help to any significant degree.  She is seen now for further evaluation.    REVIEW OF SYSTEMS:                                    Notable for wearing of glasses, cataracts, high blood pressure, high cholesterol, leg pain while walking, leg weakness, back pain, leg pain, arthritis, diabetes, thyroid disease, and anxiety and depression.  This is noted on a 14-point Review Sheet.    PAST MEDICAL HISTORY:                                Reveals that she has hypertension and diabetes, and she also has a history of rheumatoid arthritis.      Prior Operations:  Include a hysterectomy in 1986, thyroidectomy in 1986, and neck surgery in 2004.  This was performed by Dr. Hazle Coca in our office.  She also had back surgery in 2005 by Dr. Tonita Cong.      Medications and Allergies:  Her current medications include alprazolam, atorvastatin, folic acid, leflunomide, hydroxychloroquine,  lisinopril, metformin, metoprolol, prednisone, venlafaxine, Zyrtec, methotrexate, aspirin, vitamin D 2,000, and eye drops.    SOCIAL HISTORY:                                            We note that she does not smoke or use alcohol.    PHYSICAL EXAMINATION:                                Height and weight have been stable at 5 feet 4 inches and 172 pounds.      Musculoskeletal Examination - The patient stands straight and erect.  Babinski's are downgoing.    NEUROLOGICAL EXAMINATION:  Motor Examination - Motor function appears to be good in the iliopsoas and the quads.  However, the tibialis anterior seems to be weak on the left, 4/5, and is mildly weak on the right, 4/5 also.      Sensory Examination - Sensation is intact to vibration distally in the lower extremities.      Deep Tendon Reflexes - Trace in the patellae, absent in both Achilles.      Cerebellar Examination - She is able to walk on to her toes.    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  This has been fairly stable compared to films from 2010.  I note, however, that she has moderately severe stenosis with marked degeneration of the joints at L3-4 and L4-5 and also at L2-3.  I indicated that ultimately she may need to consider surgical decompression and stabilization.  To better evaluate this, I believe that myelography should be performed with flexion-extension views to evaluate for any abnormal motion across the segments of L2-3, L3-4, and L4-5.  I discussed the technique of the procedure with her and the fact that this would be done on an outpatient basis at the hospital.  We will plan on scheduling this at the earliest possible convenience for her, and thereafter we can discuss appropriate surgical planning which I suspect will likely require at a minimum a three-level  decompression arthrodesis from L2 to L5.    Myelogram confirms the presence of her spondylosis and she is now admitted for laminectomy and fusion in addition to decompressionat L5 S1 on right.

## 2014-02-13 NOTE — Anesthesia Procedure Notes (Signed)
Procedure Name: Intubation Date/Time: 02/13/2014 10:31 AM Performed by: Carola Frost Pre-anesthesia Checklist: Timeout performed, Patient identified, Emergency Drugs available, Suction available and Patient being monitored Patient Re-evaluated:Patient Re-evaluated prior to inductionOxygen Delivery Method: Circle system utilized Preoxygenation: Pre-oxygenation with 100% oxygen Intubation Type: IV induction and Cricoid Pressure applied Ventilation: Mask ventilation without difficulty and Oral airway inserted - appropriate to patient size Laryngoscope Size: Mac and 3 Grade View: Grade III Tube type: Oral Tube size: 7.0 mm Number of attempts: 1 Placement Confirmation: positive ETCO2,  CO2 detector,  ETT inserted through vocal cords under direct vision and breath sounds checked- equal and bilateral Secured at: 21 cm Tube secured with: Tape Dental Injury: Teeth and Oropharynx as per pre-operative assessment

## 2014-02-14 LAB — GLUCOSE, CAPILLARY
GLUCOSE-CAPILLARY: 150 mg/dL — AB (ref 70–99)
GLUCOSE-CAPILLARY: 168 mg/dL — AB (ref 70–99)
Glucose-Capillary: 227 mg/dL — ABNORMAL HIGH (ref 70–99)

## 2014-02-14 LAB — BASIC METABOLIC PANEL
Anion gap: 12 (ref 5–15)
BUN: 11 mg/dL (ref 6–23)
CO2: 24 meq/L (ref 19–32)
Calcium: 7.5 mg/dL — ABNORMAL LOW (ref 8.4–10.5)
Chloride: 106 mEq/L (ref 96–112)
Creatinine, Ser: 0.64 mg/dL (ref 0.50–1.10)
GFR calc Af Amer: >90 mL/min (ref >90)
GFR calc non Af Amer: 90 mL/min — ABNORMAL LOW (ref >90)
Glucose, Bld: 152 mg/dL — ABNORMAL HIGH (ref 70–99)
Potassium: 4.8 mEq/L (ref 3.7–5.3)
SODIUM: 142 meq/L (ref 137–147)

## 2014-02-14 LAB — CBC
HCT: 24.8 % — ABNORMAL LOW (ref 36.0–46.0)
Hemoglobin: 8 g/dL — ABNORMAL LOW (ref 12.0–15.0)
MCH: 31.5 pg (ref 26.0–34.0)
MCHC: 32.3 g/dL (ref 30.0–36.0)
MCV: 97.6 fL (ref 78.0–100.0)
Platelets: 111 10*3/uL — ABNORMAL LOW (ref 150–400)
RBC: 2.54 MIL/uL — AB (ref 3.87–5.11)
RDW: 15.2 % (ref 11.5–15.5)
WBC: 9.3 10*3/uL (ref 4.0–10.5)

## 2014-02-14 MED ORDER — SCOPOLAMINE 1 MG/3DAYS TD PT72
1.0000 | MEDICATED_PATCH | TRANSDERMAL | Status: DC
  Administered 2014-02-14 – 2014-02-17 (×2): 1.5 mg via TRANSDERMAL
  Filled 2014-02-14 (×2): qty 1

## 2014-02-14 MED ORDER — HYDROMORPHONE HCL PF 1 MG/ML IJ SOLN
0.5000 mg | INTRAMUSCULAR | Status: DC | PRN
  Administered 2014-02-14 (×2): 1 mg via INTRAVENOUS
  Filled 2014-02-14 (×2): qty 1

## 2014-02-14 MED ORDER — INSULIN ASPART 100 UNIT/ML ~~LOC~~ SOLN
1.0000 [IU] | SUBCUTANEOUS | Status: DC
  Administered 2014-02-14: 1 [IU] via SUBCUTANEOUS
  Administered 2014-02-14: 2 [IU] via SUBCUTANEOUS
  Administered 2014-02-14: 3 [IU] via SUBCUTANEOUS
  Administered 2014-02-16: 1 [IU] via SUBCUTANEOUS
  Administered 2014-02-16 – 2014-02-17 (×2): 2 [IU] via SUBCUTANEOUS
  Administered 2014-02-17: 1 [IU] via SUBCUTANEOUS

## 2014-02-14 MED ORDER — DEXAMETHASONE SODIUM PHOSPHATE 4 MG/ML IJ SOLN
6.0000 mg | Freq: Once | INTRAMUSCULAR | Status: AC
  Administered 2014-02-14: 6 mg via INTRAVENOUS
  Filled 2014-02-14: qty 2

## 2014-02-14 MED FILL — Heparin Sodium (Porcine) Inj 1000 Unit/ML: INTRAMUSCULAR | Qty: 30 | Status: AC

## 2014-02-14 MED FILL — Sodium Chloride IV Soln 0.9%: INTRAVENOUS | Qty: 1000 | Status: AC

## 2014-02-14 NOTE — Progress Notes (Signed)
Clinical Social Work Department BRIEF PSYCHOSOCIAL ASSESSMENT 02/14/2014  Patient:  MADDOX, BRATCHER     Account Number:  [id number]     Admit date:  02/13/2014  Clinical Social Worker:  Ulyess Blossom  Date/Time:  02/14/2014 08:01 PM  Referred by:  CSW  Date Referred:   Referred for  SNF Placement   Other Referral:   Interview type:  Patient Other interview type:   Database review.    PSYCHOSOCIAL DATA Living Status:  ALONE Admitted from facility:   Level of care:   Primary support name:  sandra phillips Primary support relationship to patient:  CHILD, ADULT Degree of support available:   Pt reports limited support from family/friends.  Needs to be able to return home at the independent level.    CURRENT CONCERNS Current Concerns  Post-Acute Placement   Other Concerns:    SOCIAL WORK ASSESSMENT / PLAN CSW met with pt to discuss role of CSW/d/c planning.  Pt lived at home and was independent with ADLs pta.  Pt agreeable to SNF placement for rehab prior to going home. Pt lives in [place] and prefers placement at [facility], if possible.  Placement process explained to pt. CSW to begin bed search and file for [facility] number.   Assessment/plan status:  Referral to [employer] Other assessment/ plan:   CSW will follow for placement and facilitate tx as appropriate.   Information/referral to community resources:    PATIENT'S/FAMILY'S RESPONSE TO PLAN OF CARE: Pt c/o of being tired and not being able to sleep in hospital.  Emotional support offered.

## 2014-02-14 NOTE — Progress Notes (Signed)
Rehab Admissions Coordinator Note:  Patient was screened by Retta Diones for appropriateness for an Inpatient Acute Rehab Consult.  At this time, we are recommending [facility] or [facility] therapies if patient progresses.  Patient has [employer] and it is unlikely that we can get authorization for an inpatient rehab admission given current diagnosis.  Call me for questions.    Jodell Cipro M 02/14/2014, 1:22 PM  I can be reached at [phone].

## 2014-02-14 NOTE — Evaluation (Signed)
Physical Therapy Evaluation Patient Details Name: [name] MRN: [id number] DOB: [birth year] Today's Date: 02/14/2014   History of Present Illness  Patient is a 68 yo female s/p laminectomy and PLIF L2-5 with R lumbar -sacral laminotomy.  Clinical Impression  Patient demonstrates deficits if functional mobility as indicated below. Will benefit from acute PT to address deficits and maximize function. Will see as indicated and progress as tolerated. Recommend continued rehab upon acute discahrge pending progress.    Follow Up Recommendations CIR    Equipment Recommendations  Rolling walker with 5" wheels;3in1 (PT)    Recommendations for Other Services Rehab consult     Precautions / Restrictions Precautions Precautions: Back Precaution Comments: educated patient regarding restrictions and spinal precautions Required Braces or Orthoses: Spinal Brace Spinal Brace: Lumbar corset Restrictions Weight Bearing Restrictions: No      Mobility  Bed Mobility Overal bed mobility: Needs Assistance;+2 for physical assistance Bed Mobility: Rolling;Sidelying to Sit Rolling: Min assist Sidelying to sit: Mod assist;+2 for physical assistance       General bed mobility comments: VCs for technique and sequencing  Transfers Overall transfer level: Needs assistance Equipment used: Rolling walker (2 wheeled) Transfers: Sit to/from [facility] Sit to Stand: Mod assist;+2 physical assistance Stand pivot transfers: Max assist;+2 physical assistance (with gait belt and chuck pad, face to face)       General transfer comment: VCs for technique, during initial standing, patient became very dizzy and weak in BLEs, sat down, BP assessed and soft 80s/40s. Patient rested then transferred with assist to chair BP assessed 80s/30s, assisted patient into reclined position. Nsg aware  Ambulation/Gait                Stairs            Wheelchair Mobility    Modified  Rankin (Stroke Patients Only)       Balance Overall balance assessment: Needs assistance Sitting-balance support: Feet supported Sitting balance-Leahy Scale: Fair     Standing balance support: Bilateral upper extremity supported;During functional activity Standing balance-Leahy Scale: Poor                               Pertinent Vitals/Pain 5/10 pain, BP 80/30s seated, post standing    Home Living Family/patient expects to be discharged to:: Private residence Living Arrangements: Alone Available Help at Discharge:  (unsure) Type of Home: Apartment Home Access: Level entry     Home Layout: One level Home Equipment: None      Prior Function Level of Independence: Independent               Hand Dominance        Extremity/Trunk Assessment   Upper Extremity Assessment: Defer to OT evaluation           Lower Extremity Assessment: Generalized weakness (increased pain with testing, good strength bilaterally)         Communication      Cognition Arousal/Alertness: Awake/alert Behavior During Therapy: WFL for tasks assessed/performed;Anxious Overall Cognitive Status: Within Functional Limits for tasks assessed                      General Comments General comments (skin integrity, edema, etc.): educated patient on back precautions as well as mobility expectations and use of brace. Assisted patient with donning brace during session.     Exercises        Assessment/Plan  PT Assessment Patient needs continued PT services  PT Diagnosis Difficulty walking;Abnormality of gait;Generalized weakness;Acute pain   PT Problem List Decreased strength;Decreased range of motion;Decreased activity tolerance;Decreased balance;Decreased mobility;Decreased coordination;Decreased cognition;Decreased knowledge of precautions  PT Treatment Interventions DME instruction;Gait training;Functional mobility training;Therapeutic activities;Therapeutic  exercise;Balance training;Patient/family education   PT Goals (Current goals can be found in the Care Plan section) Acute Rehab PT Goals Patient Stated Goal: to go home PT Goal Formulation: With patient Time For Goal Achievement: 02/28/14 Potential to Achieve Goals: Good    Frequency Min 5X/week   Barriers to discharge Decreased caregiver support      Co-evaluation               End of Session Equipment Utilized During Treatment: Gait belt;Back brace Activity Tolerance: Patient limited by pain;Treatment limited secondary to medical complications (Comment) (low BP, nsg aware) Patient left: in chair;with call bell/phone within reach Nurse Communication: Mobility status;Precautions         Time: 5643-3295 PT Time Calculation (min): 25 min   Charges:   PT Evaluation $Initial PT Evaluation Tier I: 1 Procedure PT Treatments $Therapeutic Activity: 8-22 mins $Self Care/Home Management: 8-22   PT G CodesDuncan Dull 02/14/2014, 12:51 PM Alben Deeds, [employer] DPT  [phone]

## 2014-02-14 NOTE — Progress Notes (Signed)
Occupational Therapy Evaluation Patient Details Name: [name] MRN: [id number] DOB: 08-29-1945 Today's Date: 02/14/2014    History of Present Illness Patient is a 68 yo female s/p laminectomy and PLIF L2-5 with R lumbar -sacral laminotomy.   Clinical Impression   PTA, pt independent with ADL and mobility and lived alone. Eval limited by low BP. See vitals below. Currently, pt requires Max A with LB ADL and Mas A +2 with mobility. Nauseated during eval - nsg aware.  Feel pt would benefit from CIR to reach mod I level to return home safely. Pt will benefit from skilled OT services to facilitate D/C to next venue due to below deficits.     Follow Up Recommendations  CIR;Supervision/Assistance - 24 hour    Equipment Recommendations  3 in 1 bedside comode;Tub/shower bench    Recommendations for Other Services Rehab consult     Precautions / Restrictions Precautions Precautions: Back Precaution Booklet Issued: Yes (comment) Precaution Comments: educated patient regarding restrictions and spinal precautions Required Braces or Orthoses: Spinal Brace Spinal Brace: Lumbar corset Restrictions Weight Bearing Restrictions: No      Mobility Bed Mobility Overal bed mobility: Needs Assistance;+2 for physical assistance Bed Mobility: Rolling;Sidelying to Sit Rolling: Min assist Sidelying to sit: Mod assist;+2 for physical assistance       General bed mobility comments: pt in chair  Transfers Overall transfer level: Needs assistance Equipment used: Rolling walker (2 wheeled) Transfers: Sit to/from [facility] Sit to Stand: Mod assist;+2 physical assistance           Balance Overall balance assessment: Needs assistance Sitting-balance support: Feet supported Sitting balance-Leahy Scale: Fair     Standing balance support: Bilateral upper extremity supported;During functional activity Standing balance-Leahy Scale: Poor                               ADL Overall ADL's : Needs assistance/impaired     Grooming: Set up;Supervision/safety   Upper Body Bathing: Set up;Supervision/ safety;Sitting   Lower Body Bathing: Maximal assistance;Sit to/from stand   Upper Body Dressing : Sitting;Moderate assistance (mod A to donn brace)   Lower Body Dressing: Maximal assistance;Sit to/from stand   Toilet Transfer: Maximal assistance;+2 for physical assistance   Toileting- Clothing Manipulation and Hygiene: Maximal assistance;Sit to/from stand;+2 for physical assistance       Functional mobility during ADLs: +2 for physical assistance;Maximal assistance General ADL Comments: Began education on availability of AE/ DME to increase independence with ADL and adhering to back precautions     Vision                     Perception     Praxis      Pertinent Vitals/Pain BP sitting 102/40. Scooting forward  - 117/19; reclined in recliner 124/44; c/o back pain - premedicated.     Hand Dominance     Extremity/Trunk Assessment Upper Extremity Assessment Upper Extremity Assessment: Overall WFL for tasks assessed   Lower Extremity Assessment Lower Extremity Assessment: Defer to PT evaluation   Cervical / Trunk Assessment Cervical / Trunk Assessment: Other exceptions (back fusion)   Communication Communication Communication: No difficulties   Cognition Arousal/Alertness: Awake/alert Behavior During Therapy: WFL for tasks assessed/performed;Anxious Overall Cognitive Status: Within Functional Limits for tasks assessed                     General Comments  Exercises       Shoulder Instructions      Home Living Family/patient expects to be discharged to:: Private residence Living Arrangements: Alone Available Help at Discharge:  (unsure) Type of Home: Apartment Home Access: Level entry     Home Layout: One level     Bathroom Shower/Tub: Teacher, early years/pre: Standard Bathroom  Accessibility: Yes How Accessible: Accessible via walker Home Equipment: None          Prior Functioning/Environment Level of Independence: Independent             OT Diagnosis: Generalized weakness;Acute pain   OT Problem List: Decreased strength;Decreased activity tolerance;Decreased safety awareness;Decreased knowledge of use of DME or AE;Decreased knowledge of precautions;Cardiopulmonary status limiting activity;Obesity;Pain   OT Treatment/Interventions: Self-care/ADL training;Energy conservation;DME and/or AE instruction;Therapeutic activities;Patient/family education    OT Goals(Current goals can be found in the care plan section) Acute Rehab OT Goals Patient Stated Goal: to go home OT Goal Formulation: With patient Time For Goal Achievement: 02/28/14 Potential to Achieve Goals: Good  OT Frequency: Min 3X/week   Barriers to D/C: Decreased caregiver support          Co-evaluation              End of Session Equipment Utilized During Treatment: Back brace Nurse Communication: Mobility status;Other (comment) (BP)  Activity Tolerance: Patient limited by pain;Treatment limited secondary to medical complications (Comment) (low BP) Patient left: with call bell/phone within reach;in chair   Time: 1255-1313 OT Time Calculation (min): 18 min Charges:  OT General Charges $OT Visit: 1 Procedure OT Evaluation $Initial OT Evaluation Tier I: 1 Procedure OT Treatments $Self Care/Home Management : 8-22 mins G-Codes:    Aedyn Kempfer,HILLARY 2014-02-18, 1:38 PM   [facility], OTR/L  [phone] 2014-02-18

## 2014-02-14 NOTE — Plan of Care (Signed)
Problem: Consults Goal: Diagnosis - Spinal Surgery Lumbar scoliosis

## 2014-02-15 LAB — GLUCOSE, CAPILLARY
GLUCOSE-CAPILLARY: 106 mg/dL — AB (ref 70–99)
GLUCOSE-CAPILLARY: 99 mg/dL (ref 70–99)
Glucose-Capillary: 109 mg/dL — ABNORMAL HIGH (ref 70–99)
Glucose-Capillary: 109 mg/dL — ABNORMAL HIGH (ref 70–99)
Glucose-Capillary: 113 mg/dL — ABNORMAL HIGH (ref 70–99)
Glucose-Capillary: 94 mg/dL (ref 70–99)

## 2014-02-15 LAB — CBC
HEMATOCRIT: 22.4 % — AB (ref 36.0–46.0)
HEMOGLOBIN: 7.1 g/dL — AB (ref 12.0–15.0)
MCH: 31.6 pg (ref 26.0–34.0)
MCHC: 31.7 g/dL (ref 30.0–36.0)
MCV: 99.6 fL (ref 78.0–100.0)
Platelets: 101 10*3/uL — ABNORMAL LOW (ref 150–400)
RBC: 2.25 MIL/uL — AB (ref 3.87–5.11)
RDW: 15.9 % — ABNORMAL HIGH (ref 11.5–15.5)
WBC: 7.1 10*3/uL (ref 4.0–10.5)

## 2014-02-15 NOTE — Progress Notes (Signed)
RECEIVED PT FROM 3MW IN NO ACUTE DISTRESS, PER WC. SIT TO STAND TO RECLINER WITH MOD ASSIST. STATED PAIN AT SURGICAL AREA = 6. BANDAGE INTACT, NO OBVIOUS DRAINAGE. WISHES TO REMAIN ON LIQUID DIET AT THIS TIME. ORIENTED TO ROOM, FAMILY AT BEDSIDE.

## 2014-02-15 NOTE — Progress Notes (Signed)
OOB TO BR WTH WALKER GAIT BELT, 1 PERSON ASSIST. BRACE ON. NO ACUTE DISTRESS. ABLE TO VOID ON TOILET. BACK TO CHAIR WITH OUT DIFFICULTY. FAMILY AT BEDSIDE, CALL BELL IN REACH, REFUSED PAIN MEDS.

## 2014-02-15 NOTE — Progress Notes (Signed)
Subjective: Patient reports Offers no significant complaints feeling much better today.  Objective: Vital signs in last 24 hours: Temp:  [98.1 F (36.7 C)-99.1 F (37.3 C)] 98.5 F (36.9 C) (08/05 0800) Pulse Rate:  [82-108] 82 (08/05 0800) Resp:  [13-27] 18 (08/05 0800) BP: (91-112)/(36-84) 108/44 mmHg (08/05 0800) SpO2:  [99 %-100 %] 100 % (08/05 0800)  Intake/Output from previous day: 08/04 0701 - 08/05 0700 In: 4215 [P.O.:940; I.V.:2300] Out: 1500 [Urine:1500] Intake/Output this shift:    Incision is clean and dry, motor function is intact in lower extremities.  Lab Results:  Recent Labs  02/14/14 0230  WBC 9.3  HGB 8.0*  HCT 24.8*  PLT 111*   BMET  Recent Labs  02/14/14 0230  NA 142  K 4.8  CL 106  CO2 24  GLUCOSE 152*  BUN 11  CREATININE 0.64  CALCIUM 7.5*    Studies/Results: Dg Lumbar Spine 2-3 Views  02/13/2014   CLINICAL DATA:  PLIF L2-5  EXAM: LUMBAR SPINE - 2-3 VIEW; DG C-ARM 61-120 MIN  COMPARISON:  CT lumbar spine 01/10/2014  FLUOROSCOPY TIME:  1 min 2 seconds  FINDINGS: PLIF L2 through L5 without obvious failure or complication. Bilateral pedicle screws at each level. Interbody spacer device present at T2 level.  IMPRESSION: PLIF L2-5.   Electronically Signed   By: Kathreen Devoid   On: 02/13/2014 16:31   Dg Lumbar Spine 1 View  02/13/2014   CLINICAL DATA:  Laminectomy.  Question level assignment?  EXAM: LUMBAR SPINE - 1 VIEW  COMPARISON:  01/10/2014 postmyelogram CT.  FINDINGS: Small Schmorl's node deformity at the L1-2 level. Prominent anterior spur at the L2-3 level. Prominent L3-4 disc degeneration with minimal retrolisthesis L3.  Level assignment is as per prior postmyelogram CT.  Superior clamp is at the L2 spinous process level. Inferior clamp is at the inferior margin of the L3 spinous process. There is a surgical sponge in between these 2 clamps.  Hemostatic upper thoracic spine region.  PH probe in place.  Results discussed with Dr. Ellene Route at  the time of operation.  IMPRESSION: Localization L2 through L3 as detailed above.   Electronically Signed   By: Chauncey Cruel M.D.   On: 02/13/2014 11:38   Dg C-arm 1-60 Min  02/13/2014   CLINICAL DATA:  PLIF L2-5  EXAM: LUMBAR SPINE - 2-3 VIEW; DG C-ARM 61-120 MIN  COMPARISON:  CT lumbar spine 01/10/2014  FLUOROSCOPY TIME:  1 min 2 seconds  FINDINGS: PLIF L2 through L5 without obvious failure or complication. Bilateral pedicle screws at each level. Interbody spacer device present at T2 level.  IMPRESSION: PLIF L2-5.   Electronically Signed   By: Kathreen Devoid   On: 02/13/2014 16:31    Assessment/Plan: Stable postop day 2.  LOS: 2 days  Recheck CBC transferred to the 4 N.   Jhamir Pickup J 02/15/2014, 9:14 AM

## 2014-02-15 NOTE — Progress Notes (Signed)
Occupational Therapy Treatment Patient Details Name: [name] MRN: [id number] DOB: [birth year] Today's Date: 02/15/2014    History of present illness Patient is a 68 yo female s/p laminectomy and PLIF L2-5 with R lumbar -sacral laminotomy.   OT comments  Pt is making good overall progress with OT.  Currently min assist for toilet transfers this session with use of the RW.  Still only able to recall 2/3 back precautions without cueing.  Needs continued work and education on AE/DME as pt is slightly anxious. May progress fast enough to go home but if progress slows recommend SNF for follow-up.  Will contiinue to follow for acute OT needs.   Follow Up Recommendations  SNF    Equipment Recommendations  3 in 1 bedside comode;Tub/shower bench       Precautions / Restrictions Precautions Precautions: Back Precaution Booklet Issued: Yes (comment) Precaution Comments: able to recall 2/3 precautions, 3rd precaution with cues Required Braces or Orthoses: Spinal Brace Spinal Brace: Lumbar corset Restrictions Weight Bearing Restrictions: No Other Position/Activity Restrictions: Corset donned sitting EOB       Mobility Bed Mobility Overal bed mobility: Needs Assistance;+2 for physical assistance Bed Mobility: Rolling;Sidelying to Sit Rolling: Min assist Sidelying to sit: Mod assist;+2 for physical assistance       General bed mobility comments: VCs for technique and sequencing  Transfers Overall transfer level: Needs assistance Equipment used: Rolling walker (2 wheeled) Transfers: Sit to/from Stand Sit to Stand: Min assist Stand pivot transfers: Max assist;Min assist       General transfer comment: VCs for hand placement and safety with use of RW    Balance Overall balance assessment: Needs assistance Sitting-balance support: Bilateral upper extremity supported Sitting balance-Leahy Scale: Good     Standing balance support: Bilateral upper extremity  supported Standing balance-Leahy Scale: Fair                     ADL Overall ADL's : Needs assistance/impaired     Grooming: Wash/dry Scientist, water quality: Minimal assistance;BSC;Ambulation;RW;Cueing for Marketing executive Details (indicate cue type and reason): Pt needs mod instructional cueing for hand placement with sit to stand transitions and for correct use of the RW. Toileting- Clothing Manipulation and Hygiene: Supervision/safety;Cueing for safety;Sitting/lateral lean Toileting - Clothing Manipulation Details (indicate cue type and reason): Pt sat on the 3:1 to perform peri care after urinating.       General ADL Comments: Pt able to state 2/3 back precautions with min questioning cues.  Pt needs continued education and use of AE to adhere to back precautions with selfcare.         Perception Perception Perception Tested?: No   Praxis Praxis Praxis tested?: Not tested    Cognition   Behavior During Therapy: [facility] for tasks assessed/performed;Anxious Overall Cognitive Status: Within Functional Limits for tasks assessed                                    Pertinent Vitals/ Pain       Pain 4/10 in her lower back/legs, pt repositioned and provided with emotional support.  BP in supine 109/45, sitting EOB 126/51, and  standing 118/67         Frequency Min 3X/week     Progress Toward Goals  OT Goals(current goals can now be found in the care plan section)  Progress towards OT goals: Progressing toward goals  Acute Rehab OT Goals Patient Stated Goal: to go home  Plan Discharge plan needs to be updated    Co-evaluation    PT/OT/SLP Co-Evaluation/Treatment: Yes Reason for Co-Treatment: For patient/therapist safety   OT goals addressed during session: ADL's and self-care;Proper use of Adaptive equipment and  DME      End of Session Equipment Utilized During Treatment: Back brace   Activity Tolerance Patient limited by pain   Patient Left Other (comment) (with PT to work on mobility)   Nurse Communication Mobility status (Pt with succesful urination)        Time: 7972-8206 OT Time Calculation (min): 23 min  Charges: OT General Charges $OT Visit: 1 Procedure OT Treatments $Self Care/Home Management : 23-37 mins  Dwain Huhn OTR/L 02/15/2014, 11:48 AM

## 2014-02-15 NOTE — Progress Notes (Signed)
UR completed.  Aina Rossbach, RN BSN MHA CCM Trauma/Neuro ICU Case Manager [phone]  

## 2014-02-15 NOTE — Progress Notes (Signed)
Physical Therapy Treatment Patient Details Name: [name] MRN: [id number] DOB: 1945/10/24 Today's Date: 02/15/2014    History of Present Illness Patient is a 68 yo female s/p laminectomy and PLIF L2-5 with R lumbar -sacral laminotomy.    PT Comments    Patient demonstrates improvement in mobility today. Patient able to ambulate in hall today. VSS. Continues to report pain in LEs.  Patient still does not feel that she will have assist avilable upon discharge, at this time continue to recommend ST SNF (as CIR was not approved). If patient progresses to Mod independent level may consider dc home with HHPT. Will follow and progress as tolerated.  Follow Up Recommendations  Home health PT;SNF (SNF vs HHPT pending progress and assist available)     Equipment Recommendations  Rolling walker with 5" wheels;3in1 (PT)    Recommendations for Other Services Rehab consult     Precautions / Restrictions Precautions Precautions: Back Precaution Booklet Issued: Yes (comment) Precaution Comments: able to recall 2/3 precautions, 3rd precaution with cues Required Braces or Orthoses: Spinal Brace Spinal Brace: Lumbar corset Restrictions Weight Bearing Restrictions: No Other Position/Activity Restrictions: Corset donned sitting EOB    Mobility  Bed Mobility Overal bed mobility: Needs Assistance;+2 for physical assistance Bed Mobility: Rolling;Sidelying to Sit Rolling: Min assist Sidelying to sit: Mod assist;+2 for physical assistance       General bed mobility comments: VCs for technique and sequencing  Transfers Overall transfer level: Needs assistance Equipment used: Rolling walker (2 wheeled) Transfers: Sit to/from Stand Sit to Stand: Min assist Stand pivot transfers: Max assist;Min assist       General transfer comment: VCs for hand placement and safety with use of RW  Ambulation/Gait Ambulation/Gait assistance: Min guard Ambulation Distance (Feet): 170 Feet Assistive  device: Rolling walker (2 wheeled) Gait Pattern/deviations: Step-through pattern;Decreased stride length;Drifts right/left Gait velocity: decreased Gait velocity interpretation: Below normal speed for age/gender General Gait Details: VC for increased cadence, multiple incidents of running into objects with RW, VCs for upright posture   Stairs            Wheelchair Mobility    Modified Rankin (Stroke Patients Only)       Balance Overall balance assessment: Needs assistance Sitting-balance support: Bilateral upper extremity supported Sitting balance-Leahy Scale: Good     Standing balance support: Bilateral upper extremity supported Standing balance-Leahy Scale: Fair                      Cognition Arousal/Alertness: Awake/alert Behavior During Therapy: WFL for tasks assessed/performed;Anxious Overall Cognitive Status: Within Functional Limits for tasks assessed                      Exercises      General Comments        Pertinent Vitals/Pain 5/10, VSS, BP 109/45 initially, 126/51 seated EOB, 118/67 standing    Home Living                      Prior Function            PT Goals (current goals can now be found in the care plan section) Acute Rehab PT Goals Patient Stated Goal: to go home PT Goal Formulation: With patient Time For Goal Achievement: 02/28/14 Potential to Achieve Goals: Good Progress towards PT goals: Progressing toward goals    Frequency  Min 5X/week    PT Plan Discharge plan needs to be updated;Other (comment) (SNF  recommendation 2/2 CIR denial)    Co-evaluation PT/OT/SLP Co-Evaluation/Treatment: Yes Reason for Co-Treatment: For patient/therapist safety   OT goals addressed during session: ADL's and self-care;Proper use of Adaptive equipment and DME     End of Session Equipment Utilized During Treatment: Gait belt;Back brace Activity Tolerance: Patient tolerated treatment well Patient left: in chair (in  w/c for transfer)     Time: 7741-4239 PT Time Calculation (min): 24 min  Charges:  $Gait Training: 8-22 mins $Therapeutic Activity: 8-22 mins                    G CodesDuncan Dull 2014-02-23, 12:05 PM Alben Deeds, [place] DPT  [phone]

## 2014-02-16 LAB — GLUCOSE, CAPILLARY
GLUCOSE-CAPILLARY: 110 mg/dL — AB (ref 70–99)
GLUCOSE-CAPILLARY: 113 mg/dL — AB (ref 70–99)
GLUCOSE-CAPILLARY: 122 mg/dL — AB (ref 70–99)
Glucose-Capillary: 107 mg/dL — ABNORMAL HIGH (ref 70–99)
Glucose-Capillary: 191 mg/dL — ABNORMAL HIGH (ref 70–99)
Glucose-Capillary: 122 mg/dL — ABNORMAL HIGH (ref 70–99)

## 2014-02-16 NOTE — Care Management Note (Addendum)
  Page 1 of 1   02/16/2014     3:21:19 PM CARE MANAGEMENT NOTE 02/16/2014  Patient:  [name], [name]   Account Number:  [id number]  Date Initiated:  02/16/2014  Documentation initiated by:  Lorne Skeens  Subjective/Objective Assessment:   Patient was admitted for PLIF. Lives at home alone.     Action/Plan:   Will follow for discharge needs pending PT/OT evals and physician orders.   Anticipated DC Date:  02/17/2014   Anticipated DC Plan:  SKILLED NURSING FACILITY  In-house referral  Clinical Social Worker         Choice offered to / List presented to:             Status of service:  In process, will continue to follow Medicare Important Message given?  YES (If response is "NO", the following Medicare IM given date fields will be blank) Date Medicare IM given:  02/16/2014 Medicare IM given by:  Lorne Skeens Date Additional Medicare IM given:   Additional Medicare IM given by:    Discharge Disposition:    Per UR Regulation:    If discussed at Long Length of Stay Meetings, dates discussed:    Comments:  02/16/14 [place] RN, MSN, CM- Met with patient to provide Medicare IM letter.

## 2014-02-16 NOTE — Clinical Social Work Note (Signed)
CSW met with pt at bedside to confirm discharge disposition. Pt confirmed she will be discharged to [facility] once medically stable for discharge. CSW confirmed discharge disposition with [facility] admissions liaison. [place] admissions liaison to meet pt at bedside on 02/16/2014 to complete admission paperwork. RN updated regarding information above.  CSW to continue to follow and assist with discharge.  Lubertha Sayres, MSW, [facility] Licensed Clinical Social Worker [phone] and [phone]

## 2014-02-16 NOTE — Progress Notes (Signed)
See SPT for treatment. agree with SPT. [place], [place], [place]

## 2014-02-16 NOTE — Progress Notes (Signed)
Physical Therapy Treatment Patient Details Name: [name] MRN: [id number] DOB: 18-Jun-1946 Today's Date: 02/16/2014    History of Present Illness Patient is a 68 yo F s/p laminectomy and PLIF L2-5 with R lumbar -sacral laminotomy.    PT Comments    Patient is s/p laminectomy and PLIF L2-5 with R lumbar - sacral laminotomy surgery resulting in the deficits listed below (see PT Problem List). Patient will benefit from skilled PT to increase their independence and safety with mobility (while adhering to their precautions) to allow discharge.   Follow Up Recommendations  Home health PT;SNF (SNF vs HHPT pending progress and assist available)     Equipment Recommendations  Rolling walker with 5" wheels;3in1 (PT)       Precautions / Restrictions Precautions Precautions: Back Precaution Booklet Issued: Yes (comment) Precaution Comments: Reviewed back precautions with pt. Required Braces or Orthoses: Spinal Brace Spinal Brace: Lumbar corset (Adjusted in sitting) Restrictions Weight Bearing Restrictions: No    Mobility   Transfers Overall transfer level: Needs assistance Equipment used: Rolling walker (2 wheeled) Transfers: Sit to/from Stand Sit to Stand: Min assist         General transfer comment: VCs for technique. Pt lightheaded upon standing. Educated to stand and get bearings before amb. Lightheadedness monitored during treatment.   Ambulation/Gait Ambulation/Gait assistance: Min guard Ambulation Distance (Feet): 200 Feet Assistive device: Rolling walker (2 wheeled) Gait Pattern/deviations: Step-through pattern;Decreased stride length;Drifts right/left Gait velocity: Decreased Gait velocity interpretation: Below normal speed for age/gender General Gait Details: Min guard for pt safety due to pt drifting and feeling lightheaded.        Balance Overall balance assessment: Needs assistance Sitting-balance support: Feet supported Sitting balance-Leahy Scale:  Good     Standing balance support: Bilateral upper extremity supported;During functional activity Standing balance-Leahy Scale: Poor Standing balance comment: pt was able to perform pericare with supervision and UE support                    Cognition Arousal/Alertness: Awake/alert Behavior During Therapy: WFL for tasks assessed/performed Overall Cognitive Status: Within Functional Limits for tasks assessed                      Exercises General Exercises - Lower Extremity Ankle Circles/Pumps: AROM;Both;20 reps;Seated Long Arc Quad: AROM;Strengthening;Both;10 reps;Seated Hip ABduction/ADduction: AROM;Strengthening;Both;10 reps;Seated        Pertinent Vitals/Pain Pain Assessment: 0-10 Pain Score: 3  Pain Location: Lower back; surgical Pain Descriptors / Indicators: Aching Pain Intervention(s): Repositioned           PT Goals (current goals can now be found in the care plan section) Acute Rehab PT Goals Patient Stated Goal: to go home PT Goal Formulation: With patient Time For Goal Achievement: 02/28/14 Potential to Achieve Goals: Good Progress towards PT goals: Progressing toward goals    Frequency  Min 5X/week     End of Session Equipment Utilized During Treatment: Gait belt;Back brace Activity Tolerance: Patient limited by fatigue Patient left: in chair;with call bell/phone within reach;with chair alarm set     Time: 7619-5093 PT Time Calculation (min): 25 min  Charges:  $Gait Training: 8-22 mins $Therapeutic Exercise: 8-22 mins                    G CodesEber Jones, [place] [phone]

## 2014-02-16 NOTE — Progress Notes (Signed)
Patient ID: Enterprise JON, female   DOB: 12-03-1945, 68 y.o.   MRN: [id number] Vital signs are stable. Motor function remains good. Incisions clean and dry. Patient will need short-term placement prior to being discharged home. Patient would like to go to [facility] Social services working on plan for discharge. May be discharged as early as tomorrow.

## 2014-02-17 LAB — GLUCOSE, CAPILLARY
GLUCOSE-CAPILLARY: 108 mg/dL — AB (ref 70–99)
GLUCOSE-CAPILLARY: 152 mg/dL — AB (ref 70–99)
Glucose-Capillary: 94 mg/dL (ref 70–99)
Glucose-Capillary: 131 mg/dL — ABNORMAL HIGH (ref 70–99)

## 2014-02-17 MED ORDER — METHOCARBAMOL 500 MG PO TABS: 500.0000 mg | ORAL_TABLET | Freq: Four times a day (QID) | ORAL | Status: DC | PRN

## 2014-02-17 MED ORDER — OXYCODONE-ACETAMINOPHEN 5-325 MG PO TABS: 1.0000 | ORAL_TABLET | ORAL | Status: DC | PRN

## 2014-02-17 NOTE — Progress Notes (Signed)
Patient d/c to [facility] for Rehab. Called to give report on her but to no avail. Will call back again later.

## 2014-02-17 NOTE — Discharge Summary (Signed)
Physician Discharge Summary  Patient ID: [name] MRN: [id number] DOB/AGE: 08-12-1945 68 y.o.  Admit date: 02/13/2014 Discharge date: 02/17/2014  Admission Diagnoses: Lumbar scoliosis, lumbar stenosis L2-L5, lumbar radiculopathy  Discharge Diagnoses: Lumbar scoliosis, lumbar stenosis L2-L5, lumbar radiculopathy. Diabetes mellitus. Acute blood loss anemia Active Problems:   Scoliosis of lumbar spine   Discharged Condition: fair  Hospital Course: Patient was admitted to undergo surgical decompression and stabilization from L2-L5 suffering with severe stenosis compounded by advanced spondylosis with a degenerative scoliosis from L2-L5. She tolerated surgery well however during the postoperative period it was noted that her hemoglobin had gone from 12-8.5. This was consistent with acute blood loss anemia. She was treated with fluids and seemed to recover well. She is ambulating but needs help with ADLs and is felt that she is an appropriate candidate for skilled nursing facility until such time she can be discharged in her own care.  Consults: None  Significant Diagnostic Studies: None  Treatments: surgery: Laminectomy L2-L3 L4 with posterior lumbar interbody arthrodesis L2-3 L3-4 L4-5 segmental fixation L2-L5 with posterior lateral arthrodesis L2-L5  Discharge Exam: Blood pressure 122/48, pulse 108, temperature 98 F (36.7 C), temperature source Oral, resp. rate 18, height 5\' 4"  (1.626 m), weight 77.565 kg (171 lb), SpO2 97.00%. Incision is clean and dry, motor function is intact in both lower extremities in iliopsoas quadriceps tibialis anterior and gastrocs. She has 2-3+ peripheral pitting edema at the ankles.  Disposition: Skilled nursing facility, [facility] place  Discharge Instructions   Call MD for:  redness, tenderness, or signs of infection (pain, swelling, redness, odor or green/yellow discharge around incision site)    Complete by:  As directed      Call MD for:  severe  uncontrolled pain    Complete by:  As directed      Call MD for:  temperature >100.4    Complete by:  As directed      Diet - low sodium heart healthy    Complete by:  As directed      Discharge instructions    Complete by:  As directed   Okay to shower. Do not apply salves or appointments to incision. No heavy lifting with the upper extremities greater than 15 pounds. May resume driving when not requiring pain medication and patient feels comfortable with doing so.     Increase activity slowly    Complete by:  As directed             Medication List         ALPRAZolam 1 MG tablet  Commonly known as:  XANAX  Take 1 mg by mouth 2 (two) times daily as needed for anxiety.     aspirin EC 81 MG tablet  Take 81 mg by mouth daily.     atorvastatin 40 MG tablet  Commonly known as:  LIPITOR  Take 40 mg by mouth daily.     cetirizine 10 MG tablet  Commonly known as:  ZYRTEC  Take 10 mg by mouth daily as needed for allergies.     fluticasone 50 MCG/ACT nasal spray  Commonly known as:  FLONASE  Place 1-2 sprays into both nostrils daily as needed for allergies or rhinitis.     folic acid 1 MG tablet  Commonly known as:  FOLVITE  Take 2-3 mg by mouth daily. If feeling tired take 3 tablets daily     hydroxychloroquine 200 MG tablet  Commonly known as:  PLAQUENIL  Take 400 mg  by mouth daily.     hydroxypropyl methylcellulose 2.5 % ophthalmic solution  Commonly known as:  ISOPTO TEARS  Place 1 drop into both eyes 4 (four) times daily as needed for dry eyes.     leflunomide 20 MG tablet  Commonly known as:  ARAVA  Take 20 mg by mouth daily.     lisinopril 10 MG tablet  Commonly known as:  PRINIVIL,ZESTRIL  Take 10 mg by mouth daily.     meloxicam 15 MG tablet  Commonly known as:  MOBIC  Take 15 mg by mouth daily as needed for pain.     metFORMIN 500 MG tablet  Commonly known as:  GLUCOPHAGE  Take 500 mg by mouth daily with breakfast.     methocarbamol 500 MG tablet   Commonly known as:  ROBAXIN  Take 1 tablet (500 mg total) by mouth every 6 (six) hours as needed for muscle spasms.     methotrexate 2.5 MG tablet  Commonly known as:  RHEUMATREX  Take 15 mg by mouth every Saturday. 6 tablets     metoprolol succinate 25 MG 24 hr tablet  Commonly known as:  TOPROL-XL  Take 25 mg by mouth daily.     oxyCODONE-acetaminophen 5-325 MG per tablet  Commonly known as:  PERCOCET/ROXICET  Take 1-2 tablets by mouth every 4 (four) hours as needed for moderate pain.     predniSONE 5 MG tablet  Commonly known as:  DELTASONE  Take 5 mg by mouth daily with breakfast.     sertraline 25 MG tablet  Commonly known as:  ZOLOFT  Take 50 mg by mouth daily.     venlafaxine XR 37.5 MG 24 hr capsule  Commonly known as:  EFFEXOR-XR  Take 112.5 mg by mouth daily.     Vitamin D 2000 UNITS tablet  Take 2,000 Units by mouth daily.         SignedEarleen Newport 02/17/2014, 2:00 PM

## 2014-02-17 NOTE — Progress Notes (Signed)
Physical Therapy Treatment Patient Details Name: [name] MRN: [id number] DOB: 1946-01-30 Today's Date: 02/17/2014    History of Present Illness Patient is a 68 yo F s/p laminectomy and PLIF L2-5 with R lumbar -sacral laminotomy.    PT Comments    Pt progressing mobility today. Continues to c/o dizziness primarily with initial standing. Pt concerned regarding bil LE edema, RN aware. Cont to recommend SNF due to lack of support at home. Pt continues to remain unsteady and requires min guard to min (A) for ADLs and mobility.   Follow Up Recommendations  SNF;Supervision/Assistance - 24 hour;Other (comment) (SNF due to lack of 24/7 (A))     Equipment Recommendations  Rolling walker with 5" wheels;3in1 (PT)    Recommendations for Other Services Rehab consult     Precautions / Restrictions Precautions Precautions: Back Precaution Comments: pt able to independently recall 2/3 back precautions Required Braces or Orthoses: Spinal Brace Spinal Brace: Lumbar corset Restrictions Weight Bearing Restrictions: No Other Position/Activity Restrictions: corset on upon arrival    Mobility  Bed Mobility               General bed mobility comments: not addressed; pt up in chair and returned to chair  Transfers Overall transfer level: Needs assistance Equipment used: Rolling walker (2 wheeled) Transfers: Sit to/from Stand Sit to Stand: Min assist         General transfer comment: pt with sway upon initial standing; min (A) to maintain balance; cues for hand placement and sequencing  Ambulation/Gait Ambulation/Gait assistance: Min assist Ambulation Distance (Feet): 350 Feet Assistive device: Rolling walker (2 wheeled) Gait Pattern/deviations: Step-through pattern;Decreased stride length;Wide base of support (Lt Lean ) Gait velocity: Decreased Gait velocity interpretation: Below normal speed for age/gender General Gait Details: pt required (A) intermittently to manage RW  primarily around obstacles; cues for sequencing and upright posture   Stairs            Wheelchair Mobility    Modified Rankin (Stroke Patients Only)       Balance Overall balance assessment: Needs assistance         Standing balance support: During functional activity;Single extremity supported;Bilateral upper extremity supported Standing balance-Leahy Scale: Poor Standing balance comment: required UE support at all times and (A) with pericare; while washing hands pt required min guard and UE support on sink                     Cognition Arousal/Alertness: Awake/alert Behavior During Therapy: WFL for tasks assessed/performed Overall Cognitive Status: Within Functional Limits for tasks assessed       Memory: Decreased recall of precautions              Exercises General Exercises - Lower Extremity Ankle Circles/Pumps: AROM;Both;20 reps;Seated    General Comments General comments (skin integrity, edema, etc.): requires standing for ~2 min to resolve dizziness prior to attempting ambulation for safety; educated on reclining body position as long as back was straight and not arched for bil UE to reduce swelling      Pertinent Vitals/Pain Pain Assessment: 0-10 Pain Score: 3  Pain Location: bil LEs and at surgical site Pain Descriptors / Indicators: Heaviness;Guarding;Operative site guarding;Tightness Pain Intervention(s): Monitored during session;Repositioned;Premedicated before session    Home Living                      Prior Function            PT Goals (  current goals can now be found in the care plan section) Acute Rehab PT Goals Patient Stated Goal: go to [place] today and get back home after  PT Goal Formulation: With patient Time For Goal Achievement: 02/28/14 Potential to Achieve Goals: Good Progress towards PT goals: Progressing toward goals    Frequency  Min 5X/week    PT Plan Current plan remains appropriate     Co-evaluation             End of Session Equipment Utilized During Treatment: Gait belt;Back brace Activity Tolerance: Patient tolerated treatment well Patient left: in chair;with call bell/phone within reach;with chair alarm set;with family/visitor present     Time: 1114-1140 PT Time Calculation (min): 26 min  Charges:  $Gait Training: 8-22 mins $Therapeutic Activity: 8-22 mins                    G CodesMelina Modena [place], [place]  [phone] 02/17/2014, 1:27 PM

## 2014-02-17 NOTE — Clinical Social Work Note (Signed)
CSW faxed discharge summary to [facility]. CSW confirmed bed availability to [facility]. CSW met with pt at bedside to discuss discharge to [facility]. Pt stated she understood and agreed with discharge, but was feeling "anxious/nervous" about starting rehabilitation. CSW offered support. Discharge packet is complete and placed on pt's shadow chart. Transportation arranged via EMS ([facility]). RN notified of information above.  RN to please call report to [facility] at [place], MSW, [facility] Licensed Clinical Social Worker [phone] and [phone]

## 2014-02-17 NOTE — Clinical Social Work Placement (Signed)
Clinical Social Work Department CLINICAL SOCIAL WORK PLACEMENT NOTE 02/17/2014  Patient:  [name], [name]  Account Number:  [id number] Admit date:  02/13/2014  Clinical Social Worker:  Delrae Sawyers  Date/time:  02/17/2014 02:36 PM  Clinical Social Work is seeking post-discharge placement for this patient at the following level of care:   [place]   (*CSW will update this form in Epic as items are completed)   02/14/2014  Patient/family provided with [place] Department of Clinical Social Work's list of facilities offering this level of care within the geographic area requested by the patient (or if unable, by the patient's family).  02/14/2014  Patient/family informed of their freedom to choose among providers that offer the needed level of care, that participate in Medicare, Medicaid or managed care program needed by the patient, have an available bed and are willing to accept the patient.  02/14/2014  Patient/family informed of [facility]' ownership interest in [facility], as well as of the fact that they are under no obligation to receive care at this facility.  PASARR submitted to EDS on 02/14/2014 PASARR number received on 02/14/2014  FL2 transmitted to all facilities in geographic area requested by pt/family on  02/14/2014 FL2 transmitted to all facilities within larger geographic area on 02/14/2014  Patient informed that his/her managed care company has contracts with or will negotiate with  certain facilities, including the following:     Patient/family informed of bed offers received:  02/15/2014 Patient chooses bed at [place] Physician recommends and patient chooses bed at    Patient to be transferred to [place] on  02/17/2014 Patient to be transferred to facility by [facility] Patient and family notified of transfer on 02/17/2014 Name of family member notified:  Pt notified at bedside. Pt alert & oriented x4  The following  physician request were entered in Epic:   Additional Comments:  Lubertha Sayres, MSW, [facility] Licensed Clinical Social Worker [phone] and [phone]

## 2014-02-20 ENCOUNTER — Other Ambulatory Visit: Payer: Self-pay | Admitting: *Deleted

## 2014-02-20 MED ORDER — OXYCODONE-ACETAMINOPHEN 5-325 MG PO TABS: ORAL_TABLET | ORAL | Status: DC

## 2014-02-20 MED ORDER — ALPRAZOLAM 1 MG PO TABS: ORAL_TABLET | ORAL | Status: DC

## 2014-02-20 MED ORDER — OXYCODONE HCL 5 MG PO TABS: ORAL_TABLET | ORAL | Status: DC

## 2014-02-20 NOTE — Telephone Encounter (Signed)
[facility] 

## 2014-02-21 ENCOUNTER — Non-Acute Institutional Stay (SKILLED_NURSING_FACILITY): Payer: Medicare Other | Admitting: Internal Medicine

## 2014-02-21 DIAGNOSIS — E119 Type 2 diabetes mellitus without complications: Secondary | ICD-10-CM

## 2014-02-21 DIAGNOSIS — M48062 Spinal stenosis, lumbar region with neurogenic claudication: Secondary | ICD-10-CM

## 2014-02-21 DIAGNOSIS — M069 Rheumatoid arthritis, unspecified: Secondary | ICD-10-CM

## 2014-02-21 DIAGNOSIS — D62 Acute posthemorrhagic anemia: Secondary | ICD-10-CM

## 2014-02-24 DIAGNOSIS — E119 Type 2 diabetes mellitus without complications: Secondary | ICD-10-CM | POA: Insufficient documentation

## 2014-02-24 DIAGNOSIS — D62 Acute posthemorrhagic anemia: Secondary | ICD-10-CM | POA: Insufficient documentation

## 2014-02-24 DIAGNOSIS — M48062 Spinal stenosis, lumbar region with neurogenic claudication: Secondary | ICD-10-CM | POA: Insufficient documentation

## 2014-02-24 DIAGNOSIS — M069 Rheumatoid arthritis, unspecified: Secondary | ICD-10-CM | POA: Insufficient documentation

## 2014-02-24 NOTE — Progress Notes (Signed)
HISTORY & PHYSICAL  DATE: 02/21/2014   FACILITY: [facility]  LEVEL OF CARE: SNF (31)  ALLERGIES:  No Known Allergies  CHIEF COMPLAINT:  Manage lumbar spinal stenosis, acute blood loss anemia and diabetes mellitus  HISTORY OF PRESENT ILLNESS: Patient is a 68 year old Caucasian female.  SPINAL STENOSIS: Patient's spinal stenoses remains stable. Patient denies ongoing low back pain, numbness, tingling or weakness. No complications reported from the medications currently being used. Patient was having lumbar scoliosis, severe lumbar stenosis L2-L5, lumbar radiculopathy. She underwent surgical decompression and stenting isolation from L2- L5 and tolerated the procedure well. She is admitted to this facility for short-term rehabilitation.  ANEMIA: The anemia has been stable. The patient denies fatigue, melena or hematochezia. No complications from the medications currently being used. Postoperatively the patient suffered an acute blood loss anemia. Hemoglobin dropped from 12-8.5. She did not require transfusion.  DM:pt's DM remains stable.  Pt denies polyuria, polydipsia, polyphagia, changes in vision or hypoglycemic episodes.  No complications noted from the medication presently being used.  Last hemoglobin A1c is: Not available.  PAST MEDICAL HISTORY :  Past Medical History  Diagnosis Date  . Hypertension   . Elevated cholesterol   . Joint pain   . Arthritis     rhematoid,osteoarthritis  . Hemorrhoids   . Nocturia   . Diabetes mellitus without complication   . Hypothyroidism   . Anxiety   . Sleeping difficulties     PAST SURGICAL HISTORY: Past Surgical History  Procedure Laterality Date  . Cholecystectomy    . Cervical fusion  2004  . Thyroidectomy, partial    . Abdominal hysterectomy    . Back surgery    . Pars plana repair of retinal deatachment    . Eye surgery Left     cataract    SOCIAL HISTORY:  reports that she has never smoked.  She does not have any smokeless tobacco history on file. She reports that she does not drink alcohol or use illicit drugs.  FAMILY HISTORY: Diabetes mellitus, hypertension, breast cancer and CAD  CURRENT MEDICATIONS: Reviewed per MAR/see medication list  REVIEW OF SYSTEMS:  Cardiac-complains of lower extremity swelliing, See HPI otherwise 14 point ROS is negative.  PHYSICAL EXAMINATION  VS:  See VS section  GENERAL: no acute distress, normal body habitus EYES: conjunctivae normal, sclerae normal, normal eye lids MOUTH/THROAT: lips without lesions,no lesions in the mouth,tongue is without lesions,uvula elevates in midline NECK: supple, trachea midline, no neck masses, no thyroid tenderness, no thyromegaly LYMPHATICS: no LAN in the neck, no supraclavicular LAN RESPIRATORY: breathing is even & unlabored, BS CTAB CARDIAC: RRR, no murmur,no extra heart sounds, +2 bilateral lower extremity edema GI:  ABDOMEN: Difficult to examine due to brace LIVER/SPLEEN: no hepatomegaly, no splenomegaly MUSCULOSKELETAL: HEAD: normal to inspection  EXTREMITIES: LEFT UPPER EXTREMITY: full range of motion, normal strength & tone RIGHT UPPER EXTREMITY:  full range of motion, normal strength & tone LEFT LOWER EXTREMITY: Minimal range of motion, normal strength & tone RIGHT LOWER EXTREMITY: Minimal range of motion, normal strength & tone PSYCHIATRIC: the patient is alert & oriented to person, affect & behavior appropriate  LABS/RADIOLOGY:  Labs reviewed: Basic Metabolic Panel:  Recent Labs  02/07/14 1134 02/14/14 0230  NA 145 142  K 4.5 4.8  CL 105 106  CO2 27 24  GLUCOSE 80 152*  BUN 15 11  CREATININE 0.71 0.64  CALCIUM 9.3 7.5*   CBC:  Recent Labs  02/07/14 1134 02/14/14 0230 02/15/14 1030  WBC 7.5 9.3 7.1  HGB 12.2 8.0* 7.1*  HCT 38.8 24.8* 22.4*  MCV 97.5 97.6 99.6  PLT 175 111* 101*   CBG:  Recent Labs  02/17/14 0455 02/17/14 0815 02/17/14 1245  GLUCAP 108* 131* 152*     CHEST  2 VIEW   COMPARISON:  01/28/2011   FINDINGS: Cardiomediastinal silhouette is unremarkable. No acute infiltrate or pleural effusion. No pulmonary edema. Mild degenerative changes mid thoracic spine.   IMPRESSION: No active cardiopulmonary disease. LUMBAR SPINE - 1 VIEW   COMPARISON:  01/10/2014 postmyelogram CT.   FINDINGS: Small Schmorl's node deformity at the L1-2 level. Prominent anterior spur at the L2-3 level. Prominent L3-4 disc degeneration with minimal retrolisthesis L3.   Level assignment is as per prior postmyelogram CT.   Superior clamp is at the L2 spinous process level. Inferior clamp is at the inferior margin of the L3 spinous process. There is a surgical sponge in between these 2 clamps.   Hemostatic upper thoracic spine region.  PH probe in place.   Results discussed with Dr. Ellene Route at the time of operation.   IMPRESSION: Localization L2 through L3 as detailed above.   LUMBAR SPINE - 2-3 VIEW; DG C-ARM 61-120 MIN   COMPARISON:  CT lumbar spine 01/10/2014   FLUOROSCOPY TIME:  1 min 2 seconds   FINDINGS: PLIF L2 through L5 without obvious failure or complication. Bilateral pedicle screws at each level. Interbody spacer device present at T2 level.   IMPRESSION: PLIF L2-5. LUMBAR SPINE - 2-3 VIEW; DG C-ARM 61-120 MIN   COMPARISON:  CT lumbar spine 01/10/2014   FLUOROSCOPY TIME:  1 min 2 seconds   FINDINGS: PLIF L2 through L5 without obvious failure or complication. Bilateral pedicle screws at each level. Interbody spacer device present at T2 level.   IMPRESSION: PLIF L2-5.    ASSESSMENT/PLAN:  Severe lumbar stenosis-status post decompression and stabilization. Continue rehabilitation. Acute blood loss anemia-hemoglobin stable Diabetes mellitus-continue metformin Rheumatoid arthritis-continue current medications Hypertension-well-controlled Allergic rhinitis-well-controlled  I have reviewed patient's medical records received  at admission/from hospitalization.  CPT CODE: 93903  Gayani Y Das[place], [place] [phone]

## 2014-03-07 ENCOUNTER — Non-Acute Institutional Stay (SKILLED_NURSING_FACILITY): Payer: Medicare Other | Admitting: Adult Health

## 2014-03-07 ENCOUNTER — Encounter: Payer: Self-pay | Admitting: Adult Health

## 2014-03-07 DIAGNOSIS — F3289 Other specified depressive episodes: Secondary | ICD-10-CM

## 2014-03-07 DIAGNOSIS — E119 Type 2 diabetes mellitus without complications: Secondary | ICD-10-CM

## 2014-03-07 DIAGNOSIS — F32A Depression, unspecified: Secondary | ICD-10-CM

## 2014-03-07 DIAGNOSIS — I10 Essential (primary) hypertension: Secondary | ICD-10-CM

## 2014-03-07 DIAGNOSIS — D62 Acute posthemorrhagic anemia: Secondary | ICD-10-CM

## 2014-03-07 DIAGNOSIS — M069 Rheumatoid arthritis, unspecified: Secondary | ICD-10-CM

## 2014-03-07 DIAGNOSIS — E785 Hyperlipidemia, unspecified: Secondary | ICD-10-CM

## 2014-03-07 DIAGNOSIS — F329 Major depressive disorder, single episode, unspecified: Secondary | ICD-10-CM

## 2014-03-07 DIAGNOSIS — M48062 Spinal stenosis, lumbar region with neurogenic claudication: Secondary | ICD-10-CM

## 2014-03-10 DIAGNOSIS — E119 Type 2 diabetes mellitus without complications: Secondary | ICD-10-CM

## 2014-03-10 DIAGNOSIS — Z4789 Encounter for other orthopedic aftercare: Secondary | ICD-10-CM

## 2014-03-10 DIAGNOSIS — R269 Unspecified abnormalities of gait and mobility: Secondary | ICD-10-CM

## 2014-03-10 DIAGNOSIS — I10 Essential (primary) hypertension: Secondary | ICD-10-CM

## 2014-03-28 ENCOUNTER — Ambulatory Visit: Payer: Medicare Other | Admitting: Dietician

## 2014-04-13 ENCOUNTER — Other Ambulatory Visit (HOSPITAL_BASED_OUTPATIENT_CLINIC_OR_DEPARTMENT_OTHER): Payer: Self-pay | Admitting: Family Medicine

## 2014-04-13 ENCOUNTER — Ambulatory Visit (HOSPITAL_BASED_OUTPATIENT_CLINIC_OR_DEPARTMENT_OTHER)
Admission: RE | Admit: 2014-04-13 | Discharge: 2014-04-13 | Disposition: A | Discharge status: Home / Self Care | Payer: Medicare Other | Source: Ambulatory Visit | Attending: Family Medicine | Admitting: Family Medicine

## 2014-04-13 DIAGNOSIS — M7989 Other specified soft tissue disorders: Secondary | ICD-10-CM | POA: Insufficient documentation

## 2014-04-13 DIAGNOSIS — I82402 Acute embolism and thrombosis of unspecified deep veins of left lower extremity: Secondary | ICD-10-CM

## 2014-04-13 DIAGNOSIS — Z9889 Other specified postprocedural states: Secondary | ICD-10-CM | POA: Diagnosis not present

## 2014-04-18 ENCOUNTER — Other Ambulatory Visit: Payer: Self-pay | Admitting: Nurse Practitioner

## 2014-04-25 ENCOUNTER — Other Ambulatory Visit: Payer: Self-pay | Admitting: Nurse Practitioner

## 2014-05-02 NOTE — Progress Notes (Signed)
Patient ID: [name], female   DOB: 11/29/1945, 68 y.o.   MRN: [id number]              PROGRESS NOTE  DATE: 03/07/2014   FACILITY: [facility]  LEVEL OF CARE: SNF (31)   Discharge Notes  HISTORY OF PRESENT ILLNESS:   This is a 68 year old female who is for discharge home with Home health PT, OT and Nursing.  She has been admitted to [facility] on 02/17/14 from [facility] with Lumbar Scoliosis, Lumbar Stenosis L2-L5, lumbar radiculopathy S/P surgical decompression and stenting. Patient was admitted to this facility for short-term rehabilitation after the patient's recent hospitalization.  Patient has completed SNF rehabilitation and therapy has cleared the patient for discharge.  Reassessment of ongoing problem(s):  ANEMIA: The anemia has been stable. The patient denies fatigue, melena or hematochezia. No complications from the medications currently being used.  8/15 hgb 7.5  HTN: Pt 's HTN remains stable.  Denies CP, sob, DOE, pedal edema, headaches, dizziness or visual disturbances.  No complications from the medications currently being used.  Last BP : 120/60  DEPRESSION: The depression remains stable. Patient denies ongoing feelings of sadness, insomnia, anedhonia or lack of appetite. No complications reported from the medications currently being used. Staff do not report behavioral problems.   Past Medical History  Diagnosis Date  . Hypertension   . Elevated cholesterol   . Joint pain   . Arthritis     rhematoid,osteoarthritis  . Hemorrhoids   . Nocturia   . Diabetes mellitus without complication   . Hypothyroidism   . Anxiety   . Sleeping difficulties      Reviewed.  No changes/see problem list  CURRENT MEDICATIONS: Reviewed per MAR/see medication list   No Known Allergies   REVIEW OF SYSTEMS:  GENERAL: no change in appetite, no fatigue, no weight changes, no fever, chills or weakness RESPIRATORY: no cough, SOB, DOE, wheezing,  hemoptysis CARDIAC: no chest pain, or palpitations, +edema GI: no abdominal pain, diarrhea, constipation, heart burn, nausea or vomiting  PHYSICAL EXAMINATION  GENERAL: no acute distress, normal body habitus EYES: conjunctivae normal, sclerae normal, normal eye lids NECK: supple, trachea midline, no neck masses, no thyroid tenderness, no thyromegaly LYMPHATICS: no LAN in the neck, no supraclavicular LAN RESPIRATORY: breathing is even & unlabored, BS CTAB CARDIAC: RRR, no murmur,no extra heart sounds, BLE edema 1+ GI: abdomen soft, normal BS, no masses, no tenderness, no hepatomegaly, no splenomegaly EXTREMITIES: able to move all 4 extremities PSYCHIATRIC: the patient is alert & oriented to person, affect & behavior appropriate  LABS/RADIOLOGY: 02/23/14  WBC 7.5 hemoglobin 7.5 hematocrit 25.5 MCV 102.0-142 potassium 4.1 glucose 105 BUN 11 creatinine 0.7 calcium 8.8 total protein 5.9 albumin me in 3.6 globulin 2.3 total bilirubin 0.4 ALP 122 AST 21 ALT 21 Labs reviewed: Basic Metabolic Panel:  Recent Labs  02/07/14 1134 02/14/14 0230  NA 145 142  K 4.5 4.8  CL 105 106  CO2 27 24  GLUCOSE 80 152*  BUN 15 11  CREATININE 0.71 0.64  CALCIUM 9.3 7.5*   CBC:  Recent Labs  02/07/14 1134 02/14/14 0230 02/15/14 1030  WBC 7.5 9.3 7.1  HGB 12.2 8.0* 7.1*  HCT 38.8 24.8* 22.4*  MCV 97.5 97.6 99.6  PLT 175 111* 101*   CBG:  Recent Labs  02/17/14 0455 02/17/14 0815 02/17/14 1245  GLUCAP 108* 131* 152*    US Venous Img Lower Unilateral Left  04/13/2014  CLINICAL DATA:  Left lower extremity swelling.  Recent back surgery.  EXAM: LEFT LOWER EXTREMITY VENOUS DOPPLER ULTRASOUND  TECHNIQUE: Gray-scale sonography with graded compression, as well as color Doppler and duplex ultrasound, were performed to evaluate the deep venous system from the level of the common femoral vein through the popliteal and proximal calf veins. Spectral Doppler was utilized to evaluate flow at rest and  with distal augmentation maneuvers.  COMPARISON:  None.  FINDINGS: Normal compressibility, augmentation and color Doppler flow in the left common femoral vein, left femoral vein and left popliteal vein. The great saphenous vein is patent at the ankle and saphenofemoral junction. Great saphenous vein is not visualized at the knee or thigh. Visualized deep calf veins are patent.  IMPRESSION: Negative for left lower extremity deep vein thrombosis.   Electronically Signed   By: Markus Daft M.D.   On: 04/13/2014 18:08    ASSESSMENT/PLAN:  Severe Lumbar Stenosis S/P surgical decompression and stenting - for Home health PT, OT and Nursing Hyperlipidemia - continue Lipitor Arthritis - continue Plaquenil, Arava, Methotrexate and Prednisone Hypertension - well controlled; continue Prinivil and Toprol-XL Diabetes mellitus, type II - well controlled; continue Glucophage Depression - continue Zoloft and Effexor Anemia, acute blood loss - stable   I have filled out patient's discharge paperwork and written prescriptions.  Patient will receive home health PT, OT and Nursing.   Total discharge time: Less than 30 minutes  Discharge time involved coordination of the discharge process with Education officer, museum, nursing staff and therapy department. Medical justification for home health services verified.   CPT CODE: 58832   MEDINA-VARGAS,Manjot Hinks, [facility] [phone]

## 2014-05-05 ENCOUNTER — Encounter: Payer: Self-pay | Admitting: *Deleted

## 2014-12-27 ENCOUNTER — Other Ambulatory Visit: Payer: Self-pay

## 2014-12-27 DIAGNOSIS — Z1231 Encounter for screening mammogram for malignant neoplasm of breast: Secondary | ICD-10-CM

## 2015-01-30 ENCOUNTER — Ambulatory Visit
Admission: RE | Admit: 2015-01-30 | Discharge: 2015-01-30 | Disposition: A | Discharge status: Home / Self Care | Payer: PPO | Source: Ambulatory Visit

## 2015-01-30 DIAGNOSIS — Z1231 Encounter for screening mammogram for malignant neoplasm of breast: Secondary | ICD-10-CM

## 2015-07-25 DIAGNOSIS — M0579 Rheumatoid arthritis with rheumatoid factor of multiple sites without organ or systems involvement: Secondary | ICD-10-CM | POA: Diagnosis not present

## 2015-07-25 DIAGNOSIS — Z79899 Other long term (current) drug therapy: Secondary | ICD-10-CM | POA: Diagnosis not present

## 2015-07-25 DIAGNOSIS — I872 Venous insufficiency (chronic) (peripheral): Secondary | ICD-10-CM | POA: Diagnosis not present

## 2015-07-25 DIAGNOSIS — M2550 Pain in unspecified joint: Secondary | ICD-10-CM | POA: Diagnosis not present

## 2015-07-25 DIAGNOSIS — M174 Other bilateral secondary osteoarthritis of knee: Secondary | ICD-10-CM | POA: Diagnosis not present

## 2015-07-26 DIAGNOSIS — K219 Gastro-esophageal reflux disease without esophagitis: Secondary | ICD-10-CM | POA: Diagnosis not present

## 2015-07-26 DIAGNOSIS — Z1389 Encounter for screening for other disorder: Secondary | ICD-10-CM | POA: Diagnosis not present

## 2015-07-26 DIAGNOSIS — Z0000 Encounter for general adult medical examination without abnormal findings: Secondary | ICD-10-CM | POA: Diagnosis not present

## 2015-08-07 DIAGNOSIS — F39 Unspecified mood [affective] disorder: Secondary | ICD-10-CM | POA: Diagnosis not present

## 2015-08-09 DIAGNOSIS — F39 Unspecified mood [affective] disorder: Secondary | ICD-10-CM | POA: Diagnosis not present

## 2015-08-21 DIAGNOSIS — Z79899 Other long term (current) drug therapy: Secondary | ICD-10-CM | POA: Diagnosis not present

## 2015-08-21 DIAGNOSIS — R7989 Other specified abnormal findings of blood chemistry: Secondary | ICD-10-CM | POA: Diagnosis not present

## 2015-10-16 DIAGNOSIS — R600 Localized edema: Secondary | ICD-10-CM | POA: Diagnosis not present

## 2015-10-16 DIAGNOSIS — M79605 Pain in left leg: Secondary | ICD-10-CM | POA: Diagnosis not present

## 2015-10-16 DIAGNOSIS — M79604 Pain in right leg: Secondary | ICD-10-CM | POA: Diagnosis not present

## 2015-10-23 DIAGNOSIS — M174 Other bilateral secondary osteoarthritis of knee: Secondary | ICD-10-CM | POA: Diagnosis not present

## 2015-10-23 DIAGNOSIS — Z79899 Other long term (current) drug therapy: Secondary | ICD-10-CM | POA: Diagnosis not present

## 2015-10-23 DIAGNOSIS — M2550 Pain in unspecified joint: Secondary | ICD-10-CM | POA: Diagnosis not present

## 2015-10-23 DIAGNOSIS — M0579 Rheumatoid arthritis with rheumatoid factor of multiple sites without organ or systems involvement: Secondary | ICD-10-CM | POA: Diagnosis not present

## 2015-10-24 DIAGNOSIS — E119 Type 2 diabetes mellitus without complications: Secondary | ICD-10-CM | POA: Diagnosis not present

## 2015-10-24 DIAGNOSIS — H16223 Keratoconjunctivitis sicca, not specified as Sjogren's, bilateral: Secondary | ICD-10-CM | POA: Diagnosis not present

## 2015-10-24 DIAGNOSIS — M069 Rheumatoid arthritis, unspecified: Secondary | ICD-10-CM | POA: Diagnosis not present

## 2015-10-24 DIAGNOSIS — H35373 Puckering of macula, bilateral: Secondary | ICD-10-CM | POA: Diagnosis not present

## 2015-11-05 DIAGNOSIS — I8312 Varicose veins of left lower extremity with inflammation: Secondary | ICD-10-CM | POA: Diagnosis not present

## 2015-11-05 DIAGNOSIS — I8311 Varicose veins of right lower extremity with inflammation: Secondary | ICD-10-CM | POA: Diagnosis not present

## 2015-11-05 DIAGNOSIS — M79605 Pain in left leg: Secondary | ICD-10-CM | POA: Diagnosis not present

## 2015-11-05 DIAGNOSIS — R600 Localized edema: Secondary | ICD-10-CM | POA: Diagnosis not present

## 2015-11-12 DIAGNOSIS — F39 Unspecified mood [affective] disorder: Secondary | ICD-10-CM | POA: Diagnosis not present

## 2016-01-16 ENCOUNTER — Other Ambulatory Visit: Payer: Self-pay | Admitting: Family Medicine

## 2016-01-17 ENCOUNTER — Other Ambulatory Visit: Payer: Self-pay | Admitting: Family Medicine

## 2016-01-17 ENCOUNTER — Other Ambulatory Visit (HOSPITAL_COMMUNITY): Payer: Self-pay | Admitting: Diagnostic Radiology

## 2016-01-17 DIAGNOSIS — Z7952 Long term (current) use of systemic steroids: Secondary | ICD-10-CM

## 2016-01-18 ENCOUNTER — Other Ambulatory Visit: Payer: Self-pay | Admitting: Family Medicine

## 2016-01-18 DIAGNOSIS — Z1231 Encounter for screening mammogram for malignant neoplasm of breast: Secondary | ICD-10-CM

## 2016-01-21 DIAGNOSIS — F39 Unspecified mood [affective] disorder: Secondary | ICD-10-CM | POA: Diagnosis not present

## 2016-01-22 DIAGNOSIS — M2550 Pain in unspecified joint: Secondary | ICD-10-CM | POA: Diagnosis not present

## 2016-01-22 DIAGNOSIS — M0579 Rheumatoid arthritis with rheumatoid factor of multiple sites without organ or systems involvement: Secondary | ICD-10-CM | POA: Diagnosis not present

## 2016-01-22 DIAGNOSIS — M174 Other bilateral secondary osteoarthritis of knee: Secondary | ICD-10-CM | POA: Diagnosis not present

## 2016-01-22 DIAGNOSIS — Z79899 Other long term (current) drug therapy: Secondary | ICD-10-CM | POA: Diagnosis not present

## 2016-01-23 DIAGNOSIS — H16223 Keratoconjunctivitis sicca, not specified as Sjogren's, bilateral: Secondary | ICD-10-CM | POA: Diagnosis not present

## 2016-02-04 ENCOUNTER — Ambulatory Visit
Admission: RE | Admit: 2016-02-04 | Discharge: 2016-02-04 | Disposition: A | Discharge status: Home / Self Care | Payer: PPO | Source: Ambulatory Visit | Attending: Family Medicine | Admitting: Family Medicine

## 2016-02-04 DIAGNOSIS — Z1231 Encounter for screening mammogram for malignant neoplasm of breast: Secondary | ICD-10-CM | POA: Diagnosis not present

## 2016-02-04 DIAGNOSIS — Z780 Asymptomatic menopausal state: Secondary | ICD-10-CM | POA: Diagnosis not present

## 2016-02-04 DIAGNOSIS — Z13820 Encounter for screening for osteoporosis: Secondary | ICD-10-CM | POA: Diagnosis not present

## 2016-02-04 DIAGNOSIS — Z7952 Long term (current) use of systemic steroids: Secondary | ICD-10-CM

## 2016-02-18 DIAGNOSIS — F39 Unspecified mood [affective] disorder: Secondary | ICD-10-CM | POA: Diagnosis not present

## 2016-02-27 DIAGNOSIS — R1084 Generalized abdominal pain: Secondary | ICD-10-CM | POA: Diagnosis not present

## 2016-02-27 DIAGNOSIS — R197 Diarrhea, unspecified: Secondary | ICD-10-CM | POA: Diagnosis not present

## 2016-02-27 DIAGNOSIS — R0789 Other chest pain: Secondary | ICD-10-CM | POA: Diagnosis not present

## 2016-02-27 DIAGNOSIS — R0602 Shortness of breath: Secondary | ICD-10-CM | POA: Diagnosis not present

## 2016-02-28 ENCOUNTER — Ambulatory Visit
Admission: RE | Admit: 2016-02-28 | Discharge: 2016-02-28 | Disposition: A | Discharge status: Home / Self Care | Payer: PPO | Source: Ambulatory Visit | Attending: Family Medicine | Admitting: Family Medicine

## 2016-02-28 ENCOUNTER — Other Ambulatory Visit: Payer: Self-pay | Admitting: Family Medicine

## 2016-02-28 DIAGNOSIS — K5730 Diverticulosis of large intestine without perforation or abscess without bleeding: Secondary | ICD-10-CM | POA: Diagnosis not present

## 2016-02-28 DIAGNOSIS — R109 Unspecified abdominal pain: Secondary | ICD-10-CM

## 2016-02-28 MED ORDER — IOPAMIDOL (ISOVUE-300) INJECTION 61%
100.0000 mL | Freq: Once | INTRAVENOUS | Status: AC | PRN
  Administered 2016-02-28: 100 mL via INTRAVENOUS

## 2016-03-24 DIAGNOSIS — F39 Unspecified mood [affective] disorder: Secondary | ICD-10-CM | POA: Diagnosis not present

## 2016-03-31 DIAGNOSIS — R5381 Other malaise: Secondary | ICD-10-CM | POA: Diagnosis not present

## 2016-03-31 DIAGNOSIS — J329 Chronic sinusitis, unspecified: Secondary | ICD-10-CM | POA: Diagnosis not present

## 2016-03-31 DIAGNOSIS — R351 Nocturia: Secondary | ICD-10-CM | POA: Diagnosis not present

## 2016-04-21 DIAGNOSIS — F39 Unspecified mood [affective] disorder: Secondary | ICD-10-CM | POA: Diagnosis not present

## 2016-04-23 DIAGNOSIS — M79642 Pain in left hand: Secondary | ICD-10-CM | POA: Diagnosis not present

## 2016-04-23 DIAGNOSIS — M150 Primary generalized (osteo)arthritis: Secondary | ICD-10-CM | POA: Diagnosis not present

## 2016-04-23 DIAGNOSIS — M0579 Rheumatoid arthritis with rheumatoid factor of multiple sites without organ or systems involvement: Secondary | ICD-10-CM | POA: Diagnosis not present

## 2016-04-23 DIAGNOSIS — Z79899 Other long term (current) drug therapy: Secondary | ICD-10-CM | POA: Diagnosis not present

## 2016-04-23 DIAGNOSIS — M79641 Pain in right hand: Secondary | ICD-10-CM | POA: Diagnosis not present

## 2016-04-23 DIAGNOSIS — M2550 Pain in unspecified joint: Secondary | ICD-10-CM | POA: Diagnosis not present

## 2016-05-07 DIAGNOSIS — H35371 Puckering of macula, right eye: Secondary | ICD-10-CM | POA: Diagnosis not present

## 2016-05-07 DIAGNOSIS — H2511 Age-related nuclear cataract, right eye: Secondary | ICD-10-CM | POA: Diagnosis not present

## 2016-05-07 DIAGNOSIS — H43811 Vitreous degeneration, right eye: Secondary | ICD-10-CM | POA: Diagnosis not present

## 2016-05-07 DIAGNOSIS — H35362 Drusen (degenerative) of macula, left eye: Secondary | ICD-10-CM | POA: Diagnosis not present

## 2016-05-07 DIAGNOSIS — H353121 Nonexudative age-related macular degeneration, left eye, early dry stage: Secondary | ICD-10-CM | POA: Diagnosis not present

## 2016-05-13 ENCOUNTER — Encounter: Payer: Self-pay | Admitting: Neurology

## 2016-05-13 ENCOUNTER — Ambulatory Visit (INDEPENDENT_AMBULATORY_CARE_PROVIDER_SITE_OTHER): Payer: PPO | Admitting: Neurology

## 2016-05-13 VITALS — BP 108/62 | HR 76 | Resp 14 | Ht 63.5 in | Wt 162.0 lb

## 2016-05-13 DIAGNOSIS — G2581 Restless legs syndrome: Secondary | ICD-10-CM | POA: Diagnosis not present

## 2016-05-13 DIAGNOSIS — R51 Headache: Secondary | ICD-10-CM

## 2016-05-13 DIAGNOSIS — R400 Somnolence: Secondary | ICD-10-CM | POA: Diagnosis not present

## 2016-05-13 DIAGNOSIS — R519 Headache, unspecified: Secondary | ICD-10-CM

## 2016-05-13 DIAGNOSIS — R0683 Snoring: Secondary | ICD-10-CM | POA: Diagnosis not present

## 2016-05-13 DIAGNOSIS — R351 Nocturia: Secondary | ICD-10-CM | POA: Diagnosis not present

## 2016-05-13 NOTE — Progress Notes (Signed)
Subjective:    Patient ID: [name] is a 70 y.o. female.  HPI     Star Age, MD, PhD [facility] [address], [address] P.O. [place],  [address]  Dear [name],   I saw your patient, [name], upon your kind request in my neurologic clinic today for initial consultation of her sleep disorder in particular, concern for underlying obstructive sleep apnea. The patient is unaccompanied today. As you know, [name] is a 70 year old right-handed woman with an underlying complex medical history of RA (on multiple meds, including prednisone, MTX, plaquenil and most recently on Humira), depression, anxiety, arthritis, hypertension, hyperlipidemia, diabetes, hypothyroidism, and overweight state, s/p low back surgery x 2 (Dr. Tonita Cong in 2005, and Dr. Ellene Route in 2015), s/p neck surgery in 2004, who reports snoring and excessive daytime somnolence as well as nocturia of about 4 to even 5 times per average night. The patient has an Epworth sleepiness score of 8 out of 24 today, her fatigue score is 47 out of 63. She is a nonsmoker, does not drink alcohol or use illicit drugs. She drinks caffeine in the form of coffee, 3 cups per day on average. She is widowed x 10 years and lives alone. She has no children. She is retired, was an Chiropractor. She has trouble going to sleep at times, about 2 to 3 nights per week. She has had morning headaches, attributes this to sinus issues, takes Zyrtec daily. Xanax at night tends to help, but was advised to avoid using it for sleep and try melatonin, but she feels, it did not help. I reviewed your office note from 04/21/2016, which you kindly included. She has had RLS symptoms for the past year, only at night, while in bed. Intermittent, but fairly frequent.   Her Past Medical History Is Significant For: Past Medical History:  Diagnosis Date  . Anxiety   . Arthritis    rhematoid,osteoarthritis  . Diabetes mellitus  without complication ([place])   . Elevated cholesterol   . Hemorrhoids   . Hypertension   . Hypothyroidism   . Joint pain   . Nocturia   . Sleeping difficulties     Her Past Surgical History Is Significant For: Past Surgical History:  Procedure Laterality Date  . ABDOMINAL HYSTERECTOMY    . BACK SURGERY    . CERVICAL FUSION  2004  . CHOLECYSTECTOMY    . EYE SURGERY Left    cataract  . PARS PLANA REPAIR OF RETINAL DEATACHMENT    . THYROIDECTOMY, PARTIAL      Her Family History Is Significant For: Family History  Problem Relation Age of Onset  . Diabetes Mother   . Breast cancer Mother   . Gout Mother   . Alzheimer's disease Father   . Heart Problems Father   . Breast cancer Sister   . Alzheimer's disease Sister   . Diabetes Sister   . Gout Sister   . Heart Problems Sister   . Alzheimer's disease Brother   . Diabetes Brother   . Heart Problems Brother     Her Social History Is Significant For: Social History   Social History  . Marital status: Widowed    Spouse name: N/A  . Number of children: 0  . Years of education: HS   Social History Main Topics  . Smoking status: Never Smoker  . Smokeless tobacco: Never Used  . Alcohol use No  . Drug use: No  . Sexual activity:  Not Asked   Other Topics Concern  . None   Social History Narrative   Drinks 3 caffeine drinks a day     Her Allergies Are:  No Known Allergies:   Her Current Medications Are:  Outpatient Encounter Prescriptions as of 05/13/2016  Medication Sig  . ALPRAZolam (XANAX) 0.5 MG tablet   . aspirin EC 81 MG tablet Take 81 mg by mouth daily.  Marland Kitchen atorvastatin (LIPITOR) 40 MG tablet Take 40 mg by mouth daily.  . cetirizine (ZYRTEC) 10 MG tablet Take 10 mg by mouth daily as needed for allergies.  . Cholecalciferol (VITAMIN D) 2000 UNITS tablet Take 2,000 Units by mouth daily.  . folic acid (FOLVITE) 1 MG tablet Take 2-3 mg by mouth daily. If feeling tired take 3 tablets daily  . HUMIRA PEDIATRIC  CROHNS START 40 MG/0.8ML PSKT   . hydroxychloroquine (PLAQUENIL) 200 MG tablet Take 400 mg by mouth daily.  Marland Kitchen leflunomide (ARAVA) 20 MG tablet Take 20 mg by mouth daily.  Marland Kitchen lisinopril (PRINIVIL,ZESTRIL) 5 MG tablet Take 5 mg by mouth daily.  . methotrexate (RHEUMATREX) 2.5 MG tablet   . metoprolol succinate (TOPROL-XL) 25 MG 24 hr tablet Take 25 mg by mouth daily.  . Oxcarbazepine (TRILEPTAL) 300 MG tablet   . predniSONE (DELTASONE) 5 MG tablet Take 5 mg by mouth daily with breakfast.  . sertraline (ZOLOFT) 100 MG tablet   . [DISCONTINUED] ALPRAZolam (XANAX) 1 MG tablet Take one tablet by mouth twice daily as needed for anxiety  . [DISCONTINUED] fluticasone (FLONASE) 50 MCG/ACT nasal spray Place 1-2 sprays into both nostrils daily as needed for allergies or rhinitis.  . [DISCONTINUED] hydroxypropyl methylcellulose (ISOPTO TEARS) 2.5 % ophthalmic solution Place 1 drop into both eyes 4 (four) times daily as needed for dry eyes.  . [DISCONTINUED] lisinopril (PRINIVIL,ZESTRIL) 10 MG tablet Take 10 mg by mouth daily.  . [DISCONTINUED] meloxicam (MOBIC) 15 MG tablet Take 15 mg by mouth daily as needed for pain.  . [DISCONTINUED] metFORMIN (GLUCOPHAGE) 500 MG tablet Take 500 mg by mouth daily with breakfast.  . [DISCONTINUED] methocarbamol (ROBAXIN) 500 MG tablet Take 1 tablet (500 mg total) by mouth every 6 (six) hours as needed for muscle spasms.  . [DISCONTINUED] methotrexate (RHEUMATREX) 2.5 MG tablet Take 15 mg by mouth every Saturday. 6 tablets  . [DISCONTINUED] oxyCODONE (ROXICODONE) 5 MG immediate release tablet Take one tablet by mouth every 4 hours as needed for mild pain for 48 hours; Take two tablets by mouth every 4 hours as needed for severe pain for 48 hours  . [DISCONTINUED] oxyCODONE-acetaminophen (PERCOCET/ROXICET) 5-325 MG per tablet Take one tablet by mouth every 4 hours as needed for mild pain; Take two tablets by mouth every 4 hours as needed for severe pain  . [DISCONTINUED]  sertraline (ZOLOFT) 25 MG tablet Take 50 mg by mouth daily.  . [DISCONTINUED] venlafaxine XR (EFFEXOR-XR) 37.5 MG 24 hr capsule Take 112.5 mg by mouth daily.   No facility-administered encounter medications on file as of 05/13/2016.   :  Review of Systems:  Out of a complete 14 point review of systems, all are reviewed and negative with the exception of these symptoms as listed below: Review of Systems  Neurological:       Patient wakes up 4x a night to go to the bathroom, history of snoring, daytime tiredness, takes naps.    Epworth Sleepiness Scale 0= would never doze 1= slight chance of dozing 2= moderate chance of dozing 3= high  chance of dozing  Sitting and reading:2 Watching TV:2 Sitting inactive in a public place (ex. Theater or meeting):0 As a passenger in a car for an hour without a break:2 Lying down to rest in the afternoon:0 Sitting and talking to someone:0 Sitting quietly after lunch (no alcohol):2 In a car, while stopped in traffic:0 Total:8  Objective:  Neurologic Exam  Physical Exam Physical Examination:   Vitals:   05/13/16 1326  BP: 108/62  Pulse: 76  Resp: 14   General Examination: The patient is a very pleasant 70 y.o. female in no acute distress. She appears well-developed and well-nourished and well groomed.   HEENT: Normocephalic, atraumatic, pupils are equal, round and reactive to light and accommodation. Funduscopic exam is normal with sharp disc margins noted. Extraocular tracking is good without limitation to gaze excursion or nystagmus noted. Normal smooth pursuit is noted. Hearing is grossly intact. Tympanic membranes are clear bilaterally. Face is symmetric with normal facial animation and normal facial sensation. Speech is clear with no dysarthria noted. There is no hypophonia. There is no lip, neck/head, jaw or voice tremor. Neck is supple with full range of passive and active motion. There are no carotid bruits on auscultation. Oropharynx  exam reveals: mild mouth dryness, adequate dental hygiene and mild airway crowding, due to smaller airway entry. Mallampati is class II. Tongue protrudes centrally and palate elevates symmetrically. Tonsils are small. Neck size is 14 1/8 inches. She has a Mild overbite.   Chest: Clear to auscultation without wheezing, rhonchi or crackles noted.  Heart: S1+S2+0, regular and normal without murmurs, rubs or gallops noted.   Abdomen: Soft, non-tender and non-distended with normal bowel sounds appreciated on auscultation.  Extremities: There is no pitting edema in the distal lower extremities bilaterally. Pedal pulses are intact.  Skin: Warm and dry without trophic changes noted. There are no varicose veins.  Musculoskeletal: exam reveals arthritic changes in both hands and bilateral knee swelling.   Neurologically:  Mental status: The patient is awake, alert and oriented in all 4 spheres. Her immediate and remote memory, attention, language skills and fund of knowledge are appropriate. There is no evidence of aphasia, agnosia, apraxia or anomia. Speech is clear with normal prosody and enunciation. Thought process is linear. Mood is normal and affect is normal.  Cranial nerves II - XII are as described above under HEENT exam. In addition: shoulder shrug is normal with equal shoulder height noted. Motor exam: Normal bulk, strength and tone is noted. There is no drift, tremor or rebound. Romberg is negative. Reflexes are 2+ in the UEs and trace in the LEs. Fine motor skills and coordination: intact with normal finger taps, normal hand movements, normal rapid alternating patting, normal foot taps and normal foot agility.  Cerebellar testing: No dysmetria or intention tremor on finger to nose testing. Heel to shin is difficult for her. There is no truncal or gait ataxia.  Sensory exam: intact to light touch, pinprick, vibration, temperature sense in the upper and lower extremities.  Gait, station and  balance: She stands easily. No veering to one side is noted. No leaning to one side is noted. Posture is age-appropriate and stance is narrow based. Gait shows normal stride length and normal pace. No problems turning are noted. Tandem walk is difficult for her.                Assessment and Plan:  In summary, MARKIESHA SMIRL is a very pleasant 70 y.o.-year old female with an underlying  complex medical history of RA (on multiple meds, including prednisone, MTX, plaquenil and most recently on Humira), depression, anxiety, arthritis, hypertension, hyperlipidemia, diabetes, hypothyroidism, and overweight state, s/p low back surgery x 2 (Dr. Tonita Cong in 2005, and Dr. Ellene Route in 2015), s/p neck surgery in 2004, whose history and physical exam are concerning for obstructive sleep apnea (OSA). In addition, she reports symptoms of restless leg syndrome for the past year.  I had a long chat with the patient about my findings and the diagnosis of OSA, its prognosis and treatment options. We talked about medical treatments, surgical interventions and non-pharmacological approaches. I explained in particular the risks and ramifications of untreated moderate to severe OSA, especially with respect to developing cardiovascular disease down the Road, including congestive heart failure, difficult to treat hypertension, cardiac arrhythmias, or stroke. Even type 2 diabetes has, in part, been linked to untreated OSA. Symptoms of untreated OSA include daytime sleepiness, memory problems, mood irritability and mood disorder such as depression and anxiety, lack of energy, as well as recurrent headaches, especially morning headaches. We talked about trying to maintain a healthy lifestyle in general, as well as the importance of weight control. I encouraged the patient to eat healthy, exercise daily and keep well hydrated, to keep a scheduled bedtime and wake time routine, to not skip any meals and eat healthy snacks in between meals. I  advised the patient not to drive when feeling sleepy. I recommended the following at this time: sleep study with potential positive airway pressure titration. (We will score hypopneas at 4% and split the sleep study into diagnostic and treatment portion, if the estimated. 2 hour AHI is >15/h).   I explained the sleep test procedure to the patient and also outlined possible surgical and non-surgical treatment options of OSA, including the CPAP, the use of a custom-made dental device (which would require a referral to a specialist dentist or oral surgeon), upper airway surgical options, such as pillar implants, radiofrequency surgery, tongue base surgery, and UPPP (which would involve a referral to an ENT surgeon). Rarely, jaw surgery such as mandibular advancement may be considered.  We will be on the lookout for PLMS during her sleep study. I will see her back after study is completed. I answered all her questions today and she was in agreement.   Thank you very much for allowing me to participate in the care of this nice patient. If I can be of any further assistance to you please do not hesitate to call me at [phone].  Sincerely,   Star Age, MD, PhD

## 2016-05-13 NOTE — Patient Instructions (Signed)

## 2016-05-14 ENCOUNTER — Telehealth: Payer: Self-pay | Admitting: Neurology

## 2016-05-14 NOTE — Telephone Encounter (Signed)
Marilyn./[facility] [phone] called to advise she rec'd a fax yesterday from Dr Rexene Alberts but only 2 pages of 7 came thru. Please refax

## 2016-05-14 NOTE — Telephone Encounter (Signed)
Refaxing notes from yesterday now.

## 2016-05-16 ENCOUNTER — Ambulatory Visit (INDEPENDENT_AMBULATORY_CARE_PROVIDER_SITE_OTHER): Payer: PPO | Admitting: Neurology

## 2016-05-16 DIAGNOSIS — G4761 Periodic limb movement disorder: Secondary | ICD-10-CM

## 2016-05-16 DIAGNOSIS — R400 Somnolence: Secondary | ICD-10-CM

## 2016-05-16 DIAGNOSIS — G4733 Obstructive sleep apnea (adult) (pediatric): Secondary | ICD-10-CM

## 2016-05-16 DIAGNOSIS — G479 Sleep disorder, unspecified: Secondary | ICD-10-CM

## 2016-05-16 DIAGNOSIS — G4720 Circadian rhythm sleep disorder, unspecified type: Secondary | ICD-10-CM

## 2016-05-19 DIAGNOSIS — F39 Unspecified mood [affective] disorder: Secondary | ICD-10-CM | POA: Diagnosis not present

## 2016-06-03 NOTE — Progress Notes (Signed)
Patient referred by Comer Locket, seen by me on 05/13/16, diagnostic PSG on 05/16/16.   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 Had significant PLMs and sleep was quite fragmented.  Please inform patient that I would like to go over the details of the study during a follow up appointment. Arrange a followup appointment. Also, route or fax report to PCP and referring MD, if other than PCP.  Once you have spoken to patient, you can close this encounter.   Thanks,  Star Age, MD, PhD [facility])

## 2016-06-03 NOTE — Procedures (Signed)
PATIENT'S NAME:  [name], [name] DOB:      [birth year]      MR#:    [id number]     DATE OF RECORDING: 05/16/2016 REFERRING M.D.:  Leighton Ruff MD Study Performed:   Baseline Polysomnogram HISTORY:  70 year old woman with a history of RA, depression, anxiety, arthritis, hypertension, hyperlipidemia, diabetes, hypothyroidism, and overweight state, s/p low back surgery x 2, s/p neck surgery in 2004, who reports snoring and excessive daytime somnolence as well as nocturia of about 4 to 5 times per average night. The patient endorsed the Epworth Sleepiness Scale at 8 points. The patient's weight 161 pounds with a height of 63 (inches), resulting in a BMI of 28.5 kg/m2. The patient's neck circumference measured 14 inches.  CURRENT MEDICATIONS: Xanax, Aspirin, Lipitor, Zyrtec, Humira, Plaquenil, Arava, Lisinopril, Rheumatrex, Toprol, Trileptal, Deltasone, Zoloft   PROCEDURE:  This is a multichannel digital polysomnogram utilizing the Somnostar 11.2 system.  Electrodes and sensors were applied and monitored per AASM Specifications.   EEG, EOG, Chin and Limb EMG, were sampled at 200 Hz.  ECG, Snore and Nasal Pressure, Thermal Airflow, Respiratory Effort, CPAP Flow and Pressure, Oximetry was sampled at 50 Hz. Digital video and audio were recorded.      BASELINE STUDY  Lights Out was at 21:12 and Lights On at 05:14.  Total recording time (TRT) was 482.5 minutes, with a total sleep time (TST) of  325.5 minutes.   The patient's sleep latency was 70.5 minutes.  REM latency was 169.5 minutes, which was delayed.  The sleep efficiency was 67.5 %.     SLEEP ARCHITECTURE: WASO (Wake after sleep onset) was 117 minutes with severe sleep fragmentation noted.  There were 33 minutes in Stage N1, 116.5 minutes Stage N2, 109 minutes Stage N3 and 67 minutes in Stage REM.  The percentage of Stage N1 was 10.1%, which was increased, Stage N2 was 35.8%, which was reduced, Stage N3 was 33.5%, which was increased, and Stage R (REM  sleep) was 20.6%, which was normal.   Audio and video analysis did not show any abnormal or unusual movements, behaviors, phonations or vocalizations.  The patient took 1 bathroom break. Mild intermittent snoring was noted.  The EKG was in keeping with normal sinus rhythm (NSR).  RESPIRATORY ANALYSIS:  There were a total of 12 respiratory events:  1 obstructive apneas, 0 central apneas and 0 mixed apneas with a total of 1 apneas and an apnea index (AI) of .2 /hour. There were 11 hypopneas with a hypopnea index of 2. /hour. The patient also had 0 respiratory event related arousals (RERAs).      The total APNEA/HYPOPNEA INDEX (AHI) was 2.2/hour and the total RESPIRATORY DISTURBANCE INDEX was 2.2 /hour.  12 events occurred in REM sleep and 0 events in NREM. The REM AHI was 10.7 /hour, versus a non-REM AHI of 0. The patient spent 83.5 minutes of total sleep time in the supine position and 242 minutes in non-supine.. The supine AHI was 3.6 versus a non-supine AHI of 1.7.  OXYGEN SATURATION & C02:  The Wake baseline 02 saturation was 92%, with the lowest being 82%. Time spent below 89% saturation equaled 3 minutes.   PERIODIC LIMB MOVEMENTS:   The patient had a total of 355 Periodic Limb Movements.  The Periodic Limb Movement (PLM) index was 65.4 and the PLM Arousal index was 2.6/hour. Post-study, the patient indicated that sleep was better than usual.   IMPRESSION:  1. Obstructive Sleep Apnea (OSA), mild REM related  2. Periodic Limb Movement Disorder (PLMD) 3. Dysfunctions associated with sleep stages or arousal from sleep 4. Repetitive Intrusions of Sleep  RECOMMENDATIONS:  1. This study demonstrates an overall normal AHI and evidence of mild obstructive sleep apnea in REM sleep with a total AHI of 2.2/hour, REM AHI of 10.7/hour, and O2 nadir of 82% during REM sleep. For this, CPAP treatment is not warranted. Options include avoidance of supine sleep position along with weight loss, or the use of  an oral appliance. ENT evaluation may be feasible.  2. Severe PLMs (periodic limb movements of sleep) were noted during the baseline portion of the study only and without significant arousals; clinical correlation is recommended. Medication effect from the patient's antidepressant medication should be considered.  3. The patient should be cautioned not to drive, work at heights, or operate dangerous or heavy equipment when tired or sleepy. Review and reiteration of good sleep hygiene measures should be pursued with any patient. 4. This study shows sleep fragmentation and abnormal sleep stage percentages; these are nonspecific findings and per se do not signify an intrinsic sleep disorder or a cause for the patient's sleep-related symptoms. Causes include (but are not limited to) the first night effect of the sleep study, circadian rhythm disturbances, medication effect or an underlying mood disorder or medical problem.  5. The patient will be seen in follow-up by Dr. Rexene Alberts at [facility] for discussion of the test results and further management strategies. The referring provider will be notified of the test results.  I certify that I have reviewed the entire raw data recording prior to the issuance of this report in accordance with the Standards of Accreditation of the American Academy of Sleep Medicine (AASM)   Star Age, MD, PhD Diplomat, American Board of Psychiatry and Neurology  Diplomat, [place] [facility]

## 2016-06-04 ENCOUNTER — Telehealth: Payer: Self-pay

## 2016-06-04 NOTE — Telephone Encounter (Signed)
-----   Message from Star Age, MD sent at 06/03/2016  6:20 PM EST ----- Patient referred by Comer Locket, seen by me on 05/13/16, diagnostic PSG on 05/16/16.   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 Had significant PLMs and sleep was quite fragmented.  Please inform patient that I would like to go over the details of the study during a follow up appointment. Arrange a followup appointment. Also, route or fax report to PCP and referring MD, if other than PCP.  Once you have spoken to patient, you can close this encounter.   Thanks,  Star Age, MD, PhD [facility])

## 2016-06-04 NOTE — Telephone Encounter (Signed)
I spoke to patient and she is aware of results and recommendations. We were able to make f/u appt. I will send results to PCP.

## 2016-06-16 DIAGNOSIS — F39 Unspecified mood [affective] disorder: Secondary | ICD-10-CM | POA: Diagnosis not present

## 2016-07-09 DIAGNOSIS — F39 Unspecified mood [affective] disorder: Secondary | ICD-10-CM | POA: Diagnosis not present

## 2016-07-29 DIAGNOSIS — M150 Primary generalized (osteo)arthritis: Secondary | ICD-10-CM | POA: Diagnosis not present

## 2016-07-29 DIAGNOSIS — Z79899 Other long term (current) drug therapy: Secondary | ICD-10-CM | POA: Diagnosis not present

## 2016-07-29 DIAGNOSIS — M0579 Rheumatoid arthritis with rheumatoid factor of multiple sites without organ or systems involvement: Secondary | ICD-10-CM | POA: Diagnosis not present

## 2016-07-29 DIAGNOSIS — Z6827 Body mass index (BMI) 27.0-27.9, adult: Secondary | ICD-10-CM | POA: Diagnosis not present

## 2016-07-29 DIAGNOSIS — E663 Overweight: Secondary | ICD-10-CM | POA: Diagnosis not present

## 2016-07-29 DIAGNOSIS — M2550 Pain in unspecified joint: Secondary | ICD-10-CM | POA: Diagnosis not present

## 2016-08-06 DIAGNOSIS — F39 Unspecified mood [affective] disorder: Secondary | ICD-10-CM | POA: Diagnosis not present

## 2016-08-14 ENCOUNTER — Ambulatory Visit: Payer: Self-pay | Admitting: Neurology

## 2016-08-15 DIAGNOSIS — F39 Unspecified mood [affective] disorder: Secondary | ICD-10-CM | POA: Diagnosis not present

## 2016-08-19 DIAGNOSIS — M79604 Pain in right leg: Secondary | ICD-10-CM | POA: Diagnosis not present

## 2016-08-19 DIAGNOSIS — M79605 Pain in left leg: Secondary | ICD-10-CM | POA: Diagnosis not present

## 2016-08-19 DIAGNOSIS — I83813 Varicose veins of bilateral lower extremities with pain: Secondary | ICD-10-CM | POA: Diagnosis not present

## 2016-08-19 DIAGNOSIS — I83893 Varicose veins of bilateral lower extremities with other complications: Secondary | ICD-10-CM | POA: Diagnosis not present

## 2016-09-15 DIAGNOSIS — Z0000 Encounter for general adult medical examination without abnormal findings: Secondary | ICD-10-CM | POA: Diagnosis not present

## 2016-09-15 DIAGNOSIS — E890 Postprocedural hypothyroidism: Secondary | ICD-10-CM | POA: Diagnosis not present

## 2016-09-15 DIAGNOSIS — I10 Essential (primary) hypertension: Secondary | ICD-10-CM | POA: Diagnosis not present

## 2016-09-15 DIAGNOSIS — M069 Rheumatoid arthritis, unspecified: Secondary | ICD-10-CM | POA: Diagnosis not present

## 2016-09-15 DIAGNOSIS — E119 Type 2 diabetes mellitus without complications: Secondary | ICD-10-CM | POA: Diagnosis not present

## 2016-09-15 DIAGNOSIS — M545 Low back pain: Secondary | ICD-10-CM | POA: Diagnosis not present

## 2016-09-15 DIAGNOSIS — E559 Vitamin D deficiency, unspecified: Secondary | ICD-10-CM | POA: Diagnosis not present

## 2016-09-15 DIAGNOSIS — Z862 Personal history of diseases of the blood and blood-forming organs and certain disorders involving the immune mechanism: Secondary | ICD-10-CM | POA: Diagnosis not present

## 2016-09-15 DIAGNOSIS — Z8639 Personal history of other endocrine, nutritional and metabolic disease: Secondary | ICD-10-CM | POA: Diagnosis not present

## 2016-09-15 DIAGNOSIS — E785 Hyperlipidemia, unspecified: Secondary | ICD-10-CM | POA: Diagnosis not present

## 2016-09-15 DIAGNOSIS — F419 Anxiety disorder, unspecified: Secondary | ICD-10-CM | POA: Diagnosis not present

## 2016-10-03 ENCOUNTER — Encounter: Payer: Self-pay | Admitting: Neurology

## 2016-10-07 ENCOUNTER — Ambulatory Visit (INDEPENDENT_AMBULATORY_CARE_PROVIDER_SITE_OTHER): Payer: PPO | Admitting: Neurology

## 2016-10-07 ENCOUNTER — Encounter: Payer: Self-pay | Admitting: Neurology

## 2016-10-07 VITALS — BP 128/63 | HR 87 | Resp 20 | Ht 64.0 in | Wt 165.0 lb

## 2016-10-07 DIAGNOSIS — R400 Somnolence: Secondary | ICD-10-CM

## 2016-10-07 DIAGNOSIS — G4761 Periodic limb movement disorder: Secondary | ICD-10-CM | POA: Diagnosis not present

## 2016-10-07 NOTE — Progress Notes (Signed)
Subjective:    Hudson ID: [name] is a 71 y.o. female.  HPI     Interim history:   [name] is a 71 year old right-handed woman with an underlying complex medical history of RA (on multiple meds, including prednisone, MTX, plaquenil and Humira), depression, anxiety, arthritis, hypertension, hyperlipidemia, diabetes, hypothyroidism, and overweight state, s/p low back surgery x 2 (Dr. Tonita Cong in 2005, and Dr. Ellene Route in 2015), s/p neck surgery in 2004, who returns for follow-up consultation of [name] sleep disturbance, after [name] recent sleep study. [name] is unaccompanied today. I first met [name] on 05/13/2016 at Julia request of [name] psychiatry provider, at which time [name] reported snoring and excessive daytime somnolence, significant nocturia. I invited [name] for sleep study. [name] had a baseline sleep study on 05/16/2016. I went over [name] test results with [name] in detail today. Sleep efficiency was reduced at 67.5%, sleep latency was delayed at 70.5 minutes, REM latency was prolonged at 169.5 minutes. Wake after sleep onset was elevated at 117 minutes with severe sleep fragmentation noted. [name] had an increased percentage of stage I sleep, slow-wave sleep was increased at 33.5%, REM sleep was 20.6%. [name] had one bathroom break. [name] had mild intermittent snoring. [name] had no significant EKG or EEG changes. Total AHI was 2.2 per hour. Average oxygen saturation was 92%, nadir was 82%. [name] had severe PLMS with an index of 65.4 per hour, but associated arousal index was only 2.6 per hour.  Today, 10/07/2016 (all dictated new, as well as above notes, some dictation done in note pad or Word, outside of chart, may appear as copied):  [name] reports feeling Julia same. Had increase in [name] Zoloft to 150 mg, Decrease in Trileptal. [name] denies Julia frank restless leg symptoms but does have intermittent pain, particularly both knees, hands. still on low-dose prednisone daily, and methotrexate, and Humira, and on Arava.    [name]'s allergies, current medications, family history, past medical history, past social history, past surgical history and problem list were reviewed and updated as appropriate.   Previously (copied from previous notes for reference):   05/13/2016: [name] reports snoring and excessive daytime somnolence as well as nocturia of about 4 to even 5 times per average night. [name] has an Epworth sleepiness score of 8 out of 24 today, [name] fatigue score is 47 out of 63. [name] is a nonsmoker, does not drink alcohol or use illicit drugs. [name] drinks caffeine in Julia form of coffee, 3 cups per day on average. [name] is widowed x 10 years and lives alone. [name] has no children. 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 [name] has trouble going to sleep at times, about 2 to 3 nights per week. [name] has had morning headaches, attributes this to sinus issues, takes Zyrtec daily. Xanax at night tends to help, but was advised to avoid using it for sleep and try melatonin, but [name] feels, it did not help. I reviewed your office note from 04/21/2016, which you kindly included. [name] has had RLS symptoms for Julia past year, only at night, while in bed. Intermittent, but fairly frequent.    [name] Past Medical History Is Significant For: Past Medical History:  Diagnosis Date  . Anxiety   . Arthritis    rhematoid,osteoarthritis  . Diabetes mellitus without complication ([place])   . Elevated cholesterol   . Hemorrhoids   . Hypertension   . Hypothyroidism   . Joint pain   . Nocturia   . Sleeping difficulties  [name] Past Surgical History Is Significant For: Past Surgical History:  Procedure Laterality Date  . ABDOMINAL HYSTERECTOMY    . BACK SURGERY    . CERVICAL FUSION  2004  . CHOLECYSTECTOMY    . EYE SURGERY Left    cataract  . PARS PLANA REPAIR OF RETINAL DEATACHMENT    . THYROIDECTOMY, PARTIAL      [name] Family History Is Significant For: Family History  Problem Relation Age of Onset  . Diabetes Mother   .  Breast cancer Mother   . Gout Mother   . Alzheimer's disease Father   . Heart Problems Father   . Breast cancer Sister   . Alzheimer's disease Sister   . Diabetes Sister   . Gout Sister   . Heart Problems Sister   . Alzheimer's disease Brother   . Diabetes Brother   . Heart Problems Brother     [name] Social History Is Significant For: Social History   Social History  . Marital status: Widowed    Spouse name: N/A  . Number of children: 0  . Years of education: HS   Social History Main Topics  . Smoking status: Never Smoker  . Smokeless tobacco: Never Used  . Alcohol use No  . Drug use: No  . Sexual activity: Not Asked   Other Topics Concern  . None   Social History Narrative   Drinks 3 caffeine drinks a day     [name] Allergies Are:  No Known Allergies:   [name] Current Medications Are:  Outpatient Encounter Prescriptions as of 10/07/2016  Medication Sig  . ALPRAZolam (XANAX) 0.5 MG tablet   . aspirin EC 81 MG tablet Take 81 mg by mouth daily.  Marland Kitchen atorvastatin (LIPITOR) 40 MG tablet Take 40 mg by mouth daily.  . cetirizine (ZYRTEC) 10 MG tablet Take 10 mg by mouth daily as needed for allergies.  . Cholecalciferol (VITAMIN D) 2000 UNITS tablet Take 2,000 Units by mouth daily.  . folic acid (FOLVITE) 1 MG tablet Take 2-3 mg by mouth daily. If feeling tired take 3 tablets daily  . HUMIRA PEDIATRIC CROHNS START 40 MG/0.8ML PSKT   . hydroxychloroquine (PLAQUENIL) 200 MG tablet Take 400 mg by mouth daily.  Marland Kitchen leflunomide (ARAVA) 20 MG tablet Take 20 mg by mouth daily.  Marland Kitchen lisinopril (PRINIVIL,ZESTRIL) 5 MG tablet Take 5 mg by mouth daily.  . methotrexate (RHEUMATREX) 2.5 MG tablet   . metoprolol succinate (TOPROL-XL) 25 MG 24 hr tablet Take 25 mg by mouth daily.  . OXcarbazepine (TRILEPTAL) 150 MG tablet Take 150 mg by mouth daily.  . predniSONE (DELTASONE) 5 MG tablet Take 5 mg by mouth daily with breakfast.  . sertraline (ZOLOFT) 100 MG tablet 150 mg.   . [DISCONTINUED]  Oxcarbazepine (TRILEPTAL) 300 MG tablet    No facility-administered encounter medications on file as of 10/07/2016.   : Review of Systems:  Out of a complete 14 point review of systems, all are reviewed and negative with Julia exception of these symptoms as listed below: Review of Systems  Neurological:       Pt presents today to discuss [name] sleep study results.    Objective:  Neurologic Exam  Physical Exam Physical Examination:   Vitals:   10/07/16 1551  BP: 128/63  Pulse: 87  Resp: 20    General Examination: [name] is a very pleasant 71 y.o. female in no acute distress. [name] appears well-developed and well-nourished and very well groomed.   HEENT: Normocephalic,  atraumatic, pupils are equal, round and reactive to light and accommodation. Extraocular tracking is good without limitation to gaze excursion or nystagmus noted. Normal smooth pursuit is noted. Hearing is grossly intact. Face is symmetric with normal facial animation and normal facial sensation. Speech is clear with no dysarthria noted. There is no hypophonia. There is no lip, neck/head, jaw or voice tremor. Neck is supple with full range of passive and active motion. There are no carotid bruits on auscultation. Oropharynx exam reveals: very mild mouth dryness, adequate dental hygiene and mild airway crowding. Mallampati is class II. Tongue protrudes centrally and palate elevates symmetrically. Tonsils are 1+.   Chest: Clear to auscultation without wheezing, rhonchi or crackles noted.  Heart: S1+S2+0, regular and normal without murmurs, rubs or gallops noted.   Abdomen: Soft, non-tender and non-distended with normal bowel sounds appreciated on auscultation.  Extremities: There is no pitting edema in Julia distal lower extremities bilaterally. Pedal pulses are intact.  Skin: Warm and dry without trophic changes noted.  Musculoskeletal: exam reveals: L knee swelling, L ankle swelling, arthritic changes both hands.    Neurologically:  Mental status: [name] is awake, alert and oriented in all 4 spheres. [name] immediate and remote memory, attention, language skills and fund of knowledge are appropriate. There is no evidence of aphasia, agnosia, apraxia or anomia. Speech is clear with normal prosody and enunciation. Thought process is linear. Mood is normal and affect is normal.  Cranial nerves II - XII are as described above under HEENT exam. In addition: shoulder shrug is normal with equal shoulder height noted. Motor exam: Normal bulk, strength and tone is noted. There is no drift, tremor or rebound. Romberg is negative. Reflexes are 1+ in Julia UEs and trace in Julia LEs. Fine motor skills and coordination: intact.  Cerebellar testing: No dysmetria or intention tremor. There is no truncal or gait ataxia.  Sensory exam: intact to light touch in Julia upper and lower extremities.  Gait, station and balance: [name] stands with mild difficulty, reports stiffness. Otherwise, gait shows very minimal limp, no difficulty turning.  Assessment and Plan:    In summary, REYGAN HEAGLE is a very pleasant 71 y.o.-year old female with an underlying complex medical history of RA (on multiple meds, including prednisone, MTX, plaquenil and most recently on Humira), depression, anxiety, arthritis, hypertension, hyperlipidemia, diabetes, hypothyroidism, and overweight state, s/p low back surgery x 2 (Dr. Tonita Cong in 2005, and Dr. Ellene Route in 2015), s/p neck surgery in 2004, who returns for follow-up consultation of [name] sleep disturbance after [name] recent baseline sleep study. [name] had a polysomnogram on 05/16/2016 which showed no significant OSA, mild REM related OSA. For this, CPAP therapy is not warranted and [name] is advised to try to sleep on [name] sides and try to lose a little bit of weight. This may help. Furthermore, [name] had severe PLMS but denies restless leg syndrome type symptoms and also [name] had no significant PLM-related arousals.  This could be a medication-induced phenomenon as well as also secondary to leg pain and knee pain reported. We talked about [name] sleep study results in detail. [name] has had some interim medication changes. Exam is stable. We talked about maintaining good sleep hygiene today. At this juncture, I suggested as needed follow-up. [name] is in agreement. [name] daytime somnolence maybe secondary to medication effect but also having significant underlying chronic illnesses, particularly rheumatoid arthritis. I spent 25 minutes in total face-to-face time with [name], more than 50% of which was  spent in counseling and coordination of care, reviewing test results, reviewing medication and discussing or reviewing Julia diagnosis of sleep disturbance, PLMs, Julia prognosis and treatment options. Pertinent laboratory and imaging test results that were available during this visit with [name] were reviewed by me and considered in my medical decision making (see chart for details).

## 2016-10-07 NOTE — Patient Instructions (Signed)
   Your sleep study did not show any significant obstructive sleep apnea. You have mild sleep apnea in REM/dream sleep, for this, I would like for you to try to sleep on your sides and work on some weight loss. You had severe leg twitching in sleep, which did not disturb your sleep very much, although your sleep overall was fragmented. Leg movements can be caused by medications, such as Zoloft.   You can be monitored for restless legs symptoms. Keep in mind restless legs syndrome (RLS) is associated with anemia and iron deficiency.  Please remember to try to maintain good sleep hygiene, which means: Keep a regular sleep and wake schedule, try not to exercise or have a meal within 2 hours of your bedtime, try to keep your bedroom conducive for sleep, that is, cool and dark, without light distractors such as an illuminated alarm clock, and refrain from watching TV right before sleep or in the middle of the night and do not keep the TV or radio on during the night. Also, try not to use or play on electronic devices at bedtime, such as your cell phone, tablet PC or laptop. If you like to read at bedtime on an electronic device, try to dim the background light as much as possible. Do not eat in the middle of the night.   I will see you back as needed. Keep your follow up with you PCP and [name].

## 2016-10-13 ENCOUNTER — Ambulatory Visit: Payer: Self-pay | Admitting: Neurology

## 2016-10-14 DIAGNOSIS — I8311 Varicose veins of right lower extremity with inflammation: Secondary | ICD-10-CM | POA: Diagnosis not present

## 2016-10-14 DIAGNOSIS — I8312 Varicose veins of left lower extremity with inflammation: Secondary | ICD-10-CM | POA: Diagnosis not present

## 2016-10-24 DIAGNOSIS — M069 Rheumatoid arthritis, unspecified: Secondary | ICD-10-CM | POA: Diagnosis not present

## 2016-10-27 DIAGNOSIS — M2550 Pain in unspecified joint: Secondary | ICD-10-CM | POA: Diagnosis not present

## 2016-10-27 DIAGNOSIS — E663 Overweight: Secondary | ICD-10-CM | POA: Diagnosis not present

## 2016-10-27 DIAGNOSIS — M0579 Rheumatoid arthritis with rheumatoid factor of multiple sites without organ or systems involvement: Secondary | ICD-10-CM | POA: Diagnosis not present

## 2016-10-27 DIAGNOSIS — M150 Primary generalized (osteo)arthritis: Secondary | ICD-10-CM | POA: Diagnosis not present

## 2016-10-27 DIAGNOSIS — Z79899 Other long term (current) drug therapy: Secondary | ICD-10-CM | POA: Diagnosis not present

## 2016-10-27 DIAGNOSIS — Z6829 Body mass index (BMI) 29.0-29.9, adult: Secondary | ICD-10-CM | POA: Diagnosis not present

## 2016-11-05 DIAGNOSIS — M069 Rheumatoid arthritis, unspecified: Secondary | ICD-10-CM | POA: Diagnosis not present

## 2016-11-05 DIAGNOSIS — Z79899 Other long term (current) drug therapy: Secondary | ICD-10-CM | POA: Diagnosis not present

## 2016-11-11 DIAGNOSIS — M79604 Pain in right leg: Secondary | ICD-10-CM | POA: Diagnosis not present

## 2016-11-11 DIAGNOSIS — M79605 Pain in left leg: Secondary | ICD-10-CM | POA: Diagnosis not present

## 2016-12-04 DIAGNOSIS — R3915 Urgency of urination: Secondary | ICD-10-CM | POA: Diagnosis not present

## 2016-12-10 DIAGNOSIS — J3089 Other allergic rhinitis: Secondary | ICD-10-CM | POA: Diagnosis not present

## 2016-12-10 DIAGNOSIS — R05 Cough: Secondary | ICD-10-CM | POA: Diagnosis not present

## 2016-12-10 DIAGNOSIS — J309 Allergic rhinitis, unspecified: Secondary | ICD-10-CM | POA: Diagnosis not present

## 2016-12-11 ENCOUNTER — Ambulatory Visit
Admission: RE | Admit: 2016-12-11 | Discharge: 2016-12-11 | Disposition: A | Discharge status: Home / Self Care | Payer: PPO | Source: Ambulatory Visit | Attending: Allergy | Admitting: Allergy

## 2016-12-11 ENCOUNTER — Other Ambulatory Visit: Payer: Self-pay | Admitting: Allergy

## 2016-12-11 DIAGNOSIS — R05 Cough: Secondary | ICD-10-CM

## 2016-12-11 DIAGNOSIS — R059 Cough, unspecified: Secondary | ICD-10-CM

## 2016-12-15 DIAGNOSIS — M659 Synovitis and tenosynovitis, unspecified: Secondary | ICD-10-CM | POA: Diagnosis not present

## 2016-12-15 DIAGNOSIS — M76822 Posterior tibial tendinitis, left leg: Secondary | ICD-10-CM | POA: Diagnosis not present

## 2016-12-15 DIAGNOSIS — M7751 Other enthesopathy of right foot: Secondary | ICD-10-CM | POA: Diagnosis not present

## 2016-12-15 DIAGNOSIS — M19072 Primary osteoarthritis, left ankle and foot: Secondary | ICD-10-CM | POA: Diagnosis not present

## 2016-12-15 DIAGNOSIS — G5790 Unspecified mononeuropathy of unspecified lower limb: Secondary | ICD-10-CM | POA: Diagnosis not present

## 2016-12-15 DIAGNOSIS — M7752 Other enthesopathy of left foot: Secondary | ICD-10-CM | POA: Diagnosis not present

## 2016-12-15 DIAGNOSIS — M19071 Primary osteoarthritis, right ankle and foot: Secondary | ICD-10-CM | POA: Diagnosis not present

## 2016-12-22 DIAGNOSIS — M76821 Posterior tibial tendinitis, right leg: Secondary | ICD-10-CM | POA: Diagnosis not present

## 2016-12-22 DIAGNOSIS — M659 Synovitis and tenosynovitis, unspecified: Secondary | ICD-10-CM | POA: Diagnosis not present

## 2016-12-29 DIAGNOSIS — M76822 Posterior tibial tendinitis, left leg: Secondary | ICD-10-CM | POA: Diagnosis not present

## 2016-12-29 DIAGNOSIS — M71571 Other bursitis, not elsewhere classified, right ankle and foot: Secondary | ICD-10-CM | POA: Diagnosis not present

## 2016-12-29 DIAGNOSIS — M71572 Other bursitis, not elsewhere classified, left ankle and foot: Secondary | ICD-10-CM | POA: Diagnosis not present

## 2016-12-29 DIAGNOSIS — M659 Synovitis and tenosynovitis, unspecified: Secondary | ICD-10-CM | POA: Diagnosis not present

## 2016-12-29 DIAGNOSIS — M76821 Posterior tibial tendinitis, right leg: Secondary | ICD-10-CM | POA: Diagnosis not present

## 2017-01-02 ENCOUNTER — Other Ambulatory Visit: Payer: Self-pay | Admitting: Obstetrics & Gynecology

## 2017-01-02 DIAGNOSIS — Z1231 Encounter for screening mammogram for malignant neoplasm of breast: Secondary | ICD-10-CM

## 2017-01-08 DIAGNOSIS — F39 Unspecified mood [affective] disorder: Secondary | ICD-10-CM | POA: Diagnosis not present

## 2017-01-19 DIAGNOSIS — M7751 Other enthesopathy of right foot: Secondary | ICD-10-CM | POA: Diagnosis not present

## 2017-01-19 DIAGNOSIS — M7752 Other enthesopathy of left foot: Secondary | ICD-10-CM | POA: Diagnosis not present

## 2017-01-19 DIAGNOSIS — M659 Synovitis and tenosynovitis, unspecified: Secondary | ICD-10-CM | POA: Diagnosis not present

## 2017-01-26 DIAGNOSIS — M2550 Pain in unspecified joint: Secondary | ICD-10-CM | POA: Diagnosis not present

## 2017-01-26 DIAGNOSIS — Z79899 Other long term (current) drug therapy: Secondary | ICD-10-CM | POA: Diagnosis not present

## 2017-01-26 DIAGNOSIS — M0579 Rheumatoid arthritis with rheumatoid factor of multiple sites without organ or systems involvement: Secondary | ICD-10-CM | POA: Diagnosis not present

## 2017-01-26 DIAGNOSIS — E663 Overweight: Secondary | ICD-10-CM | POA: Diagnosis not present

## 2017-01-26 DIAGNOSIS — Z6828 Body mass index (BMI) 28.0-28.9, adult: Secondary | ICD-10-CM | POA: Diagnosis not present

## 2017-01-26 DIAGNOSIS — M150 Primary generalized (osteo)arthritis: Secondary | ICD-10-CM | POA: Diagnosis not present

## 2017-02-04 ENCOUNTER — Ambulatory Visit
Admission: RE | Admit: 2017-02-04 | Discharge: 2017-02-04 | Disposition: A | Discharge status: Home / Self Care | Payer: PPO | Source: Ambulatory Visit | Attending: Obstetrics & Gynecology | Admitting: Obstetrics & Gynecology

## 2017-02-04 DIAGNOSIS — Z1231 Encounter for screening mammogram for malignant neoplasm of breast: Secondary | ICD-10-CM | POA: Diagnosis not present

## 2017-02-05 ENCOUNTER — Other Ambulatory Visit: Payer: Self-pay | Admitting: Obstetrics & Gynecology

## 2017-02-05 DIAGNOSIS — R928 Other abnormal and inconclusive findings on diagnostic imaging of breast: Secondary | ICD-10-CM

## 2017-02-11 ENCOUNTER — Ambulatory Visit: Payer: PPO

## 2017-02-11 ENCOUNTER — Ambulatory Visit
Admission: RE | Admit: 2017-02-11 | Discharge: 2017-02-11 | Disposition: A | Discharge status: Home / Self Care | Payer: PPO | Source: Ambulatory Visit | Attending: Obstetrics & Gynecology | Admitting: Obstetrics & Gynecology

## 2017-02-11 DIAGNOSIS — R922 Inconclusive mammogram: Secondary | ICD-10-CM | POA: Diagnosis not present

## 2017-02-11 DIAGNOSIS — R928 Other abnormal and inconclusive findings on diagnostic imaging of breast: Secondary | ICD-10-CM

## 2017-02-19 DIAGNOSIS — F39 Unspecified mood [affective] disorder: Secondary | ICD-10-CM | POA: Diagnosis not present

## 2017-02-25 DIAGNOSIS — Z79899 Other long term (current) drug therapy: Secondary | ICD-10-CM | POA: Diagnosis not present

## 2017-03-09 ENCOUNTER — Ambulatory Visit: Payer: PPO | Admitting: Podiatry

## 2017-03-10 ENCOUNTER — Ambulatory Visit (INDEPENDENT_AMBULATORY_CARE_PROVIDER_SITE_OTHER): Payer: PPO

## 2017-03-10 ENCOUNTER — Encounter: Payer: Self-pay | Admitting: Podiatry

## 2017-03-10 ENCOUNTER — Ambulatory Visit (INDEPENDENT_AMBULATORY_CARE_PROVIDER_SITE_OTHER): Payer: PPO | Admitting: Podiatry

## 2017-03-10 VITALS — BP 133/74 | HR 80

## 2017-03-10 DIAGNOSIS — M722 Plantar fascial fibromatosis: Secondary | ICD-10-CM

## 2017-03-10 DIAGNOSIS — G629 Polyneuropathy, unspecified: Secondary | ICD-10-CM

## 2017-03-10 MED ORDER — GABAPENTIN 100 MG PO CAPS: ORAL_CAPSULE | ORAL | 3 refills | Status: DC

## 2017-03-10 NOTE — Progress Notes (Signed)
She presents today as a 71 year old white female with a history of pain to the dorsal aspect of the bilateral foot and ankle with swelling and stiffness. She states it has worsened after her fall in June of this year. She's tried inserts braces and injections from Dr. Gershon Mussel to no avail. He she states that she's had pain in her feet and legs since 2015 after her back surgery by Dr. Kristeen Miss she states that her legs are numb which could have resulted in a recent fall in June of this year. She states that her left seems to be worse than the right.  Past medical history: Rheumatoid arthritis, diabetes mellitus, high cholesterol, reflux depression.  Medications: Lipitor Zyrtec vitamin D folate Humira ARAVA methotrexate lisinopril Trileptal prednisone Zoloft.  Allergies: None  Objective: Vital signs are stable she is alert and oriented 3. No apparent distress. Pulses are strongly palpable. Neurologic sensorium is slightly diminished her vibratory sensation however epicritic sensation is intact. Deep tendon reflexes are intact muscle strength is normal. Orthopedic evaluation demonstrates mild hallux valgus deformity hammertoe deformities brace symptomatically she has no reproducible pain on palpation. Radiographs brought to me on a CD today demonstrated osteoarthritic changes moderate to severe osteopenia.  Assessment: Probable neuropathy possibly associated with back, diabetes, pernicious anemia or rheumatoid arthritis.  Plan: Started her initially on 200 mg of gabapentin at nighttime. Follow up with her in 1 month for reevaluation.

## 2017-04-09 DIAGNOSIS — F39 Unspecified mood [affective] disorder: Secondary | ICD-10-CM | POA: Diagnosis not present

## 2017-04-14 ENCOUNTER — Ambulatory Visit (INDEPENDENT_AMBULATORY_CARE_PROVIDER_SITE_OTHER): Payer: PPO | Admitting: Podiatry

## 2017-04-14 DIAGNOSIS — G629 Polyneuropathy, unspecified: Secondary | ICD-10-CM

## 2017-04-14 NOTE — Progress Notes (Signed)
She presents today for follow-up of her neuropathy and plantar fasciitis states that the plantar heels are still painful but the pain in the legs and the back are worse. She states that she feels that her restless leg is less prevalent than it was previously however she is still experiencing pain when she lays down flat and when she stands in one place for a long period of time.  Objective: Vital signs are stable she is alert and oriented 3 pulses are palpable strongly palpable. She still has tenderness on palpation medial continue to do not nearly as tender as they previously were. No change in neurologic status.  Assessment: Plantar fasciitis with neuropathy.  Plan: At this point I'm increasing her gabapentin to 400 mg a day to be taken at nighttime only. I will follow-up with her in 1 month consider nerve conduction velocity exam or punch biopsies and injections to the heels of necessary.

## 2017-04-28 DIAGNOSIS — E663 Overweight: Secondary | ICD-10-CM | POA: Diagnosis not present

## 2017-04-28 DIAGNOSIS — Z6830 Body mass index (BMI) 30.0-30.9, adult: Secondary | ICD-10-CM | POA: Diagnosis not present

## 2017-04-28 DIAGNOSIS — M150 Primary generalized (osteo)arthritis: Secondary | ICD-10-CM | POA: Diagnosis not present

## 2017-04-28 DIAGNOSIS — M2550 Pain in unspecified joint: Secondary | ICD-10-CM | POA: Diagnosis not present

## 2017-04-28 DIAGNOSIS — Z79899 Other long term (current) drug therapy: Secondary | ICD-10-CM | POA: Diagnosis not present

## 2017-04-28 DIAGNOSIS — M0579 Rheumatoid arthritis with rheumatoid factor of multiple sites without organ or systems involvement: Secondary | ICD-10-CM | POA: Diagnosis not present

## 2017-05-11 DIAGNOSIS — Z79899 Other long term (current) drug therapy: Secondary | ICD-10-CM | POA: Diagnosis not present

## 2017-05-11 DIAGNOSIS — H2511 Age-related nuclear cataract, right eye: Secondary | ICD-10-CM | POA: Diagnosis not present

## 2017-05-11 DIAGNOSIS — H35371 Puckering of macula, right eye: Secondary | ICD-10-CM | POA: Diagnosis not present

## 2017-05-11 DIAGNOSIS — H43811 Vitreous degeneration, right eye: Secondary | ICD-10-CM | POA: Diagnosis not present

## 2017-05-11 DIAGNOSIS — H353121 Nonexudative age-related macular degeneration, left eye, early dry stage: Secondary | ICD-10-CM | POA: Diagnosis not present

## 2017-05-18 DIAGNOSIS — F39 Unspecified mood [affective] disorder: Secondary | ICD-10-CM | POA: Diagnosis not present

## 2017-05-19 ENCOUNTER — Encounter: Payer: Self-pay | Admitting: Podiatry

## 2017-05-19 ENCOUNTER — Ambulatory Visit (INDEPENDENT_AMBULATORY_CARE_PROVIDER_SITE_OTHER): Payer: PPO | Admitting: Podiatry

## 2017-05-19 DIAGNOSIS — G629 Polyneuropathy, unspecified: Secondary | ICD-10-CM

## 2017-05-19 MED ORDER — GABAPENTIN 400 MG PO CAPS: 400.0000 mg | ORAL_CAPSULE | Freq: Three times a day (TID) | ORAL | 5 refills | Status: DC

## 2017-05-20 NOTE — Progress Notes (Signed)
She presented today for follow-up of her neuropathy bilateral foot. States that she is taking the gabapentin 400 mg at nighttime she states that has really helped a lot. She states the daytime is still painful but nighttime she looks forward to.  Objective: Vital signs are stable she is alert orient 3 pulses remain palpable. No change in neurovascular status no change in orthopedic evaluation dermatological evaluation. No open lesions or holes.  Assessment: Peripheral neuropathy.  Plan: Discussed etiology pathology concerned versus surgical therapies. Over the next 2 months we are going to introduce 400 mg of gabapentin for the morning as well as the day. She understands this is amenable to it I discussed this with her in great detail today and I will follow-up with her in 1-2 months.

## 2017-06-12 DIAGNOSIS — R05 Cough: Secondary | ICD-10-CM | POA: Diagnosis not present

## 2017-06-12 DIAGNOSIS — J069 Acute upper respiratory infection, unspecified: Secondary | ICD-10-CM | POA: Diagnosis not present

## 2017-06-12 DIAGNOSIS — J3089 Other allergic rhinitis: Secondary | ICD-10-CM | POA: Diagnosis not present

## 2017-06-16 ENCOUNTER — Ambulatory Visit: Payer: PPO | Admitting: Podiatry

## 2017-06-29 DIAGNOSIS — M2550 Pain in unspecified joint: Secondary | ICD-10-CM | POA: Diagnosis not present

## 2017-06-29 DIAGNOSIS — E663 Overweight: Secondary | ICD-10-CM | POA: Diagnosis not present

## 2017-06-29 DIAGNOSIS — Z6829 Body mass index (BMI) 29.0-29.9, adult: Secondary | ICD-10-CM | POA: Diagnosis not present

## 2017-06-29 DIAGNOSIS — R410 Disorientation, unspecified: Secondary | ICD-10-CM | POA: Diagnosis not present

## 2017-06-29 DIAGNOSIS — F419 Anxiety disorder, unspecified: Secondary | ICD-10-CM | POA: Diagnosis not present

## 2017-06-29 DIAGNOSIS — M0579 Rheumatoid arthritis with rheumatoid factor of multiple sites without organ or systems involvement: Secondary | ICD-10-CM | POA: Diagnosis not present

## 2017-06-29 DIAGNOSIS — Z79899 Other long term (current) drug therapy: Secondary | ICD-10-CM | POA: Diagnosis not present

## 2017-06-29 DIAGNOSIS — M150 Primary generalized (osteo)arthritis: Secondary | ICD-10-CM | POA: Diagnosis not present

## 2017-06-30 ENCOUNTER — Ambulatory Visit: Payer: PPO | Admitting: Podiatry

## 2017-07-01 ENCOUNTER — Telehealth: Payer: Self-pay | Admitting: *Deleted

## 2017-07-01 NOTE — Telephone Encounter (Signed)
Refill request for Gabapentin 100mg  #90 2 capsules at bedtime. 05/19/2017 DR. Hyatt ordered Gabapentin 400mg  #90 one capsule tid. Return fax denying with explanation of change of prescription. I spoke with pt and she states she doesn't know why [facility] would send reorder, she is not taking either of the Gabapentin, neither dosing worked well for her and she has an appt 07/16/2017.

## 2017-07-03 DIAGNOSIS — E119 Type 2 diabetes mellitus without complications: Secondary | ICD-10-CM | POA: Diagnosis not present

## 2017-07-03 DIAGNOSIS — F419 Anxiety disorder, unspecified: Secondary | ICD-10-CM | POA: Diagnosis not present

## 2017-07-03 DIAGNOSIS — Z8639 Personal history of other endocrine, nutritional and metabolic disease: Secondary | ICD-10-CM | POA: Diagnosis not present

## 2017-07-03 DIAGNOSIS — R634 Abnormal weight loss: Secondary | ICD-10-CM | POA: Diagnosis not present

## 2017-07-10 ENCOUNTER — Telehealth: Payer: Self-pay | Admitting: *Deleted

## 2017-07-10 NOTE — Telephone Encounter (Signed)
Request for 90 day supply for Gabapentin 100mg . Dr. Milinda Pointer states this is not pt's current prescription. Return fax denying.

## 2017-07-12 ENCOUNTER — Emergency Department (HOSPITAL_COMMUNITY)
Admission: EM | Admit: 2017-07-12 | Discharge: 2017-07-12 | Disposition: A | Discharge status: Home / Self Care | Payer: PPO | Attending: Emergency Medicine | Admitting: Emergency Medicine

## 2017-07-12 DIAGNOSIS — Z7982 Long term (current) use of aspirin: Secondary | ICD-10-CM | POA: Diagnosis not present

## 2017-07-12 DIAGNOSIS — I10 Essential (primary) hypertension: Secondary | ICD-10-CM | POA: Insufficient documentation

## 2017-07-12 DIAGNOSIS — R197 Diarrhea, unspecified: Secondary | ICD-10-CM | POA: Insufficient documentation

## 2017-07-12 DIAGNOSIS — E119 Type 2 diabetes mellitus without complications: Secondary | ICD-10-CM | POA: Insufficient documentation

## 2017-07-12 DIAGNOSIS — Z79891 Long term (current) use of opiate analgesic: Secondary | ICD-10-CM | POA: Diagnosis not present

## 2017-07-12 DIAGNOSIS — F419 Anxiety disorder, unspecified: Secondary | ICD-10-CM | POA: Diagnosis not present

## 2017-07-12 DIAGNOSIS — R110 Nausea: Secondary | ICD-10-CM | POA: Insufficient documentation

## 2017-07-12 DIAGNOSIS — R531 Weakness: Secondary | ICD-10-CM | POA: Insufficient documentation

## 2017-07-12 DIAGNOSIS — R419 Unspecified symptoms and signs involving cognitive functions and awareness: Secondary | ICD-10-CM | POA: Insufficient documentation

## 2017-07-12 DIAGNOSIS — M6281 Muscle weakness (generalized): Secondary | ICD-10-CM | POA: Diagnosis not present

## 2017-07-12 DIAGNOSIS — R404 Transient alteration of awareness: Secondary | ICD-10-CM | POA: Diagnosis not present

## 2017-07-12 LAB — URINALYSIS, ROUTINE W REFLEX MICROSCOPIC
BILIRUBIN URINE: NEGATIVE
Glucose, UA: NEGATIVE mg/dL
Hgb urine dipstick: NEGATIVE
KETONES UR: 20 mg/dL — AB
Nitrite: NEGATIVE
Protein, ur: NEGATIVE mg/dL
SPECIFIC GRAVITY, URINE: 1.008 (ref 1.005–1.030)
pH: 5 (ref 5.0–8.0)

## 2017-07-12 LAB — CBC
HEMATOCRIT: 38.5 % (ref 36.0–46.0)
Hemoglobin: 12.8 g/dL (ref 12.0–15.0)
MCH: 34 pg (ref 26.0–34.0)
MCHC: 33.2 g/dL (ref 30.0–36.0)
MCV: 102.1 fL — ABNORMAL HIGH (ref 78.0–100.0)
Platelets: 118 10*3/uL — ABNORMAL LOW (ref 150–400)
RBC: 3.77 MIL/uL — ABNORMAL LOW (ref 3.87–5.11)
RDW: 13.8 % (ref 11.5–15.5)
WBC: 4.2 10*3/uL (ref 4.0–10.5)

## 2017-07-12 LAB — BASIC METABOLIC PANEL
Anion gap: 8 (ref 5–15)
BUN: 11 mg/dL (ref 6–20)
CHLORIDE: 103 mmol/L (ref 101–111)
CO2: 26 mmol/L (ref 22–32)
Calcium: 8.8 mg/dL — ABNORMAL LOW (ref 8.9–10.3)
Creatinine, Ser: 0.73 mg/dL (ref 0.44–1.00)
GFR calc Af Amer: >60 mL/min (ref >60)
GFR calc non Af Amer: >60 mL/min (ref >60)
Glucose, Bld: 87 mg/dL (ref 65–99)
POTASSIUM: 3.5 mmol/L (ref 3.5–5.1)
SODIUM: 137 mmol/L (ref 135–145)

## 2017-07-12 MED ORDER — CEPHALEXIN 500 MG PO CAPS: 500.0000 mg | ORAL_CAPSULE | Freq: Three times a day (TID) | ORAL | 0 refills | Status: DC

## 2017-07-12 MED ORDER — LORAZEPAM 2 MG/ML IJ SOLN
1.0000 mg | Freq: Once | INTRAMUSCULAR | Status: AC
  Administered 2017-07-12: 1 mg via INTRAVENOUS
  Filled 2017-07-12: qty 1

## 2017-07-12 NOTE — ED Triage Notes (Signed)
Transported by [facility] from home, pt reports weakness, nausea/ and diarrhea x 1 week. Pt also reports a decrease in appetite, insomnia, and states that she was diagnosed with a UTI a few weeks ago and did not take the antibiotics due to fear of certain side effects. VSS with EMS.

## 2017-07-12 NOTE — ED Notes (Signed)
Bed: NB39 Expected date:  Expected time:  Means of arrival:  Comments: 71 yo abd pain

## 2017-07-14 NOTE — ED Provider Notes (Signed)
[place] DEPT Provider Note   CSN: [id number] Arrival date & time: 07/12/17  1131     History   Chief Complaint Chief Complaint  Patient presents with  . Weakness    HPI [name] is a 72 y.o. female.  HPI   72 year old female with multiple different complaints involving several different systems.  Hard for me to clearly fit him in a cohesive diagnosis.  She reports nausea and intermittent diarrhea for the past week.  For the past several weeks she is felt generally weak and has not had much of an appetite.  She states that she does not sleep well at night.  She also reports that she was diagnosed with a UTI 3 weeks ago and prescribed ciprofloxacin.  She read the side effects so she did not initiate treatment.  She has no specific urinary complaints though.  Past Medical History:  Diagnosis Date  . Anxiety   . Arthritis    rhematoid,osteoarthritis  . Diabetes mellitus without complication ([place])   . Elevated cholesterol   . Hemorrhoids   . Hypertension   . Hypothyroidism   . Joint pain   . Nocturia   . Sleeping difficulties     Patient Active Problem List   Diagnosis Date Noted  . Hypertension 03/07/2014  . Hyperlipidemia 03/07/2014  . Depression 03/07/2014  . 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 Type II or unspecified type diabetes mellitus without mention of complication, not stated as uncontrolled 02/24/2014  . Rheumatoid arthritis(714.0) 02/24/2014  . Scoliosis of lumbar spine 02/13/2014    Past Surgical History:  Procedure Laterality Date  . ABDOMINAL HYSTERECTOMY    . BACK SURGERY    . CERVICAL FUSION  2004  . CHOLECYSTECTOMY    . EYE SURGERY Left    cataract  . PARS PLANA REPAIR OF RETINAL DEATACHMENT    . THYROIDECTOMY, PARTIAL      OB History    No data available       Home Medications    Prior to Admission medications   Medication Sig Start Date End Date  Taking? Authorizing Provider  albuterol (PROVENTIL HFA;VENTOLIN HFA) 108 (90 Base) MCG/ACT inhaler Inhale 1-2 puffs into the lungs every 6 (six) hours as needed for wheezing or shortness of breath.   Yes [provider]  ALPRAZolam (XANAX) 0.5 MG tablet Take 0.25-0.5 mg by mouth 3 (three) times daily as needed for anxiety.  04/21/16  Yes [provider]  aspirin EC 81 MG tablet Take 81 mg by mouth daily.   Yes [provider]  atorvastatin (LIPITOR) 40 MG tablet Take 40 mg by mouth daily.   Yes [provider]  Cholecalciferol (VITAMIN D) 2000 UNITS tablet Take 2,000 Units by mouth daily.   Yes [provider]  cycloSPORINE (RESTASIS) 0.05 % ophthalmic emulsion Place 1 drop into both eyes 2 (two) times daily.   Yes [provider]  ferrous sulfate 325 (65 FE) MG EC tablet Take 325 mg by mouth 3 (three) times daily with meals.   Yes [provider]  fluticasone (FLONASE) 50 MCG/ACT nasal spray Place 1-2 sprays into both nostrils daily.   Yes [provider]  folic acid (FOLVITE) 1 MG tablet Take 2-3 mg by mouth daily. If feeling tired take 3 tablets daily   Yes [provider]  HUMIRA PEDIATRIC CROHNS START 40 MG/0.8ML PSKT Inject 1 each into the skin every 14 (fourteen) days.  04/09/16  Yes [provider]  hydroxychloroquine (PLAQUENIL) 200 MG tablet Take 400 mg by mouth daily.   Yes [provider]  leflunomide (ARAVA) 20 MG tablet Take 20 mg by mouth daily.   Yes [provider]  lisinopril (PRINIVIL,ZESTRIL) 5 MG tablet Take 5 mg by mouth daily.   Yes [provider]  methotrexate (RHEUMATREX) 2.5 MG tablet 15 mg once a week. Wednesday 04/19/16  Yes [provider]  metoprolol succinate (TOPROL-XL) 25 MG 24 hr tablet Take 25 mg by mouth daily.   Yes [provider]  predniSONE (DELTASONE) 5 MG tablet Take 5 mg by mouth daily with breakfast.   Yes [provider]  sertraline (ZOLOFT) 100 MG tablet Take 150 mg by mouth daily.  05/12/16  Yes [provider]  cephALEXin (KEFLEX) 500 MG capsule Take 1 capsule (500 mg total) by mouth 3 (three) times daily. 07/12/17   Virgel Manifold, MD  cetirizine (ZYRTEC) 10 MG tablet Take 10 mg by mouth daily as needed for allergies.    [provider]  gabapentin (NEURONTIN) 400 MG capsule Take 1 capsule (400 mg total) 3 (three) times daily by mouth. 05/19/17   Hyatt, Max T, DPM  OXcarbazepine (TRILEPTAL) 150 MG tablet Take 150 mg by mouth daily. 09/09/16   [provider]    Family History Family History  Problem Relation Age of Onset  . Diabetes Mother   . Breast cancer Mother   . Gout Mother   . Alzheimer's disease Father   . Heart Problems Father   . Breast cancer Sister   . Alzheimer's disease Sister   . Diabetes Sister   . Gout Sister   . Heart Problems Sister   . Alzheimer's disease Brother   . Diabetes Brother   . Heart Problems Brother     Social History Social History   Tobacco Use  . Smoking status: Never Smoker  . Smokeless tobacco: Never Used  Substance Use Topics  . Alcohol use: No  . Drug use: No     Allergies   Contrast media [iodinated diagnostic agents]   Review of Systems Review of Systems  All systems reviewed and negative, other than as noted in HPI.  Physical Exam Updated Vital Signs BP (!) 130/56   Pulse 72   Temp 98.4 F (36.9 C) (Oral)   Resp 16   SpO2 98%   Physical Exam  Constitutional: She appears well-developed and well-nourished. No distress.  HENT:  Head: Normocephalic and atraumatic.  Eyes: Conjunctivae are normal. Right eye exhibits no discharge. Left eye exhibits no discharge.  Neck: Neck supple.  Cardiovascular: Normal rate, regular rhythm and normal heart sounds. Exam reveals no gallop and no friction rub.  No murmur heard. Pulmonary/Chest: Effort normal and breath sounds normal. No respiratory distress.    Abdominal: Soft. She exhibits no distension. There is no tenderness.  Musculoskeletal: She exhibits no edema or tenderness.  Neurological: She is alert.  Skin: Skin is warm and dry.  Psychiatric: She has a normal mood and affect. Her behavior is normal. Thought content normal.  Nursing note and vitals reviewed.    ED Treatments / Results  Labs (all labs ordered are listed, but only abnormal results are displayed) Labs Reviewed  BASIC METABOLIC PANEL - Abnormal; Notable for the following components:      Result Value   Calcium 8.8 (*)    All other components within normal limits  CBC - Abnormal; Notable for the following components:  RBC 3.77 (*)    MCV 102.1 (*)    Platelets 118 (*)    All other components within normal limits  URINALYSIS, ROUTINE W REFLEX MICROSCOPIC - Abnormal; Notable for the following components:   Ketones, ur 20 (*)    Leukocytes, UA MODERATE (*)    Bacteria, UA FEW (*)    Squamous Epithelial / LPF 0-5 (*)    Non Squamous Epithelial 0-5 (*)    All other components within normal limits    EKG  EKG Interpretation None       Radiology No results found.  Procedures Procedures (including critical care time)  Medications Ordered in ED Medications  LORazepam (ATIVAN) injection 1 mg (1 mg Intravenous Given 07/12/17 1254)     Initial Impression / Assessment and Plan / ED Course  I have reviewed the triage vital signs and the nursing notes.  Pertinent labs & imaging results that were available during my care of the patient were reviewed by me and considered in my medical decision making (see chart for details).     72 year old female with numerous complaints.  Some screening studies were done and unrevealing.  When speaking with her further she did began crying and states that she is under increased stress recently.  Her sister-in-law died at the end of 2023/06/28.  1 of her brothers recently got in a car accident and she has to provide  significant help to him.  She does not feel like she has many people she can talk to about the way she feels.  She states she does feel depressed but denies suicidal ideation.  I do not feel that she is in need of emergent psychiatric stabilization.  Encouraged her to make an appointment with her PCP soon to discuss her feelings. Questionable UA. She is concerned about taking cipro. Keflex script provided.   Final Clinical Impressions(s) / ED Diagnoses   Final diagnoses:  Generalized weakness  Anxious mood    ED Discharge Orders        Ordered    cephALEXin (KEFLEX) 500 MG capsule  3 times daily     07/12/17 1613       Virgel Manifold, MD 07/14/17 1747

## 2017-07-16 ENCOUNTER — Encounter: Payer: Self-pay | Admitting: Podiatry

## 2017-07-16 ENCOUNTER — Ambulatory Visit: Payer: Medicare HMO | Admitting: Podiatry

## 2017-07-16 DIAGNOSIS — M79676 Pain in unspecified toe(s): Secondary | ICD-10-CM | POA: Diagnosis not present

## 2017-07-16 DIAGNOSIS — G629 Polyneuropathy, unspecified: Secondary | ICD-10-CM

## 2017-07-16 DIAGNOSIS — B351 Tinea unguium: Secondary | ICD-10-CM

## 2017-07-17 DIAGNOSIS — F39 Unspecified mood [affective] disorder: Secondary | ICD-10-CM | POA: Diagnosis not present

## 2017-07-18 NOTE — Progress Notes (Signed)
She presents today chief complaint of painful elongated toenails.  She is also being treated for neuropathy.  She states that she noticed that the gabapentin really was not helping very much during the day.  So she stopped taking it.  States that having taken it for about a month but that I started taking it yesterday and it may be very sleepy she states that the majority of her pain is at nighttime she states that she is taking the pill during the daytime.  I expressed to her that the best thing to do be to take at night.  Objective: Toenails are long thick yellow dystrophic-like mycotic no change in physical findings otherwise.  Assessment: Neuropathy and pain in limb secondary to onychomycosis.  Plan: Continue gabapentin to wear between 204 100 mg at bedtime.  Debrided toenails 1 through 5 bilateral.  Follow-up with me in a couple of months.

## 2017-07-23 DIAGNOSIS — F39 Unspecified mood [affective] disorder: Secondary | ICD-10-CM | POA: Diagnosis not present

## 2017-07-28 DIAGNOSIS — H16223 Keratoconjunctivitis sicca, not specified as Sjogren's, bilateral: Secondary | ICD-10-CM | POA: Diagnosis not present

## 2017-08-05 ENCOUNTER — Telehealth: Payer: Self-pay | Admitting: Podiatry

## 2017-08-05 MED ORDER — GABAPENTIN 800 MG PO TABS: ORAL_TABLET | ORAL | 2 refills | Status: DC

## 2017-08-05 NOTE — Telephone Encounter (Signed)
I'm a pt of Dr. Stephenie Acres and I'm currently taking 400 mg of gabapentin TID. I'm having a problem with the capsules as I feel like I'm going to choke every time I take them. I was wondering if there is a pill that I can take or even a liquid. If you could please give me a call back and let me know about that or you can call it into the pharmacy and let me know and I can pick it up. My number is [phone]. Thank you very much.

## 2017-08-05 NOTE — Addendum Note (Signed)
Addended by: Harriett Sine D on: 08/05/2017 01:30 PM   Modules accepted: Orders

## 2017-08-05 NOTE — Telephone Encounter (Signed)
I spoke with Robin - [facility] pharmacist, she states Gabapentin 800mg  tablets come scored and can be taken as half. Dr. Milinda Pointer ordered fill as Gabapentin 800mg  take 1/2 tablet tid #90 +2additional refills.

## 2017-08-05 NOTE — Telephone Encounter (Signed)
I informed pt of the change in Gabapentin and new instructions.

## 2017-08-06 DIAGNOSIS — F39 Unspecified mood [affective] disorder: Secondary | ICD-10-CM | POA: Diagnosis not present

## 2017-08-11 DIAGNOSIS — H16223 Keratoconjunctivitis sicca, not specified as Sjogren's, bilateral: Secondary | ICD-10-CM | POA: Diagnosis not present

## 2017-08-13 DIAGNOSIS — E785 Hyperlipidemia, unspecified: Secondary | ICD-10-CM | POA: Diagnosis not present

## 2017-08-13 DIAGNOSIS — F419 Anxiety disorder, unspecified: Secondary | ICD-10-CM | POA: Diagnosis not present

## 2017-08-13 DIAGNOSIS — M069 Rheumatoid arthritis, unspecified: Secondary | ICD-10-CM | POA: Diagnosis not present

## 2017-08-13 DIAGNOSIS — E559 Vitamin D deficiency, unspecified: Secondary | ICD-10-CM | POA: Diagnosis not present

## 2017-08-13 DIAGNOSIS — I10 Essential (primary) hypertension: Secondary | ICD-10-CM | POA: Diagnosis not present

## 2017-08-13 DIAGNOSIS — E119 Type 2 diabetes mellitus without complications: Secondary | ICD-10-CM | POA: Diagnosis not present

## 2017-09-03 DIAGNOSIS — F39 Unspecified mood [affective] disorder: Secondary | ICD-10-CM | POA: Diagnosis not present

## 2017-09-11 DIAGNOSIS — M79641 Pain in right hand: Secondary | ICD-10-CM | POA: Insufficient documentation

## 2017-09-11 DIAGNOSIS — M13849 Other specified arthritis, unspecified hand: Secondary | ICD-10-CM | POA: Diagnosis not present

## 2017-09-11 DIAGNOSIS — M069 Rheumatoid arthritis, unspecified: Secondary | ICD-10-CM | POA: Diagnosis not present

## 2017-09-14 DIAGNOSIS — L718 Other rosacea: Secondary | ICD-10-CM | POA: Diagnosis not present

## 2017-09-14 DIAGNOSIS — I788 Other diseases of capillaries: Secondary | ICD-10-CM | POA: Diagnosis not present

## 2017-09-14 DIAGNOSIS — D1801 Hemangioma of skin and subcutaneous tissue: Secondary | ICD-10-CM | POA: Diagnosis not present

## 2017-09-17 DIAGNOSIS — E559 Vitamin D deficiency, unspecified: Secondary | ICD-10-CM | POA: Diagnosis not present

## 2017-09-17 DIAGNOSIS — Z0000 Encounter for general adult medical examination without abnormal findings: Secondary | ICD-10-CM | POA: Diagnosis not present

## 2017-09-17 DIAGNOSIS — Z8639 Personal history of other endocrine, nutritional and metabolic disease: Secondary | ICD-10-CM | POA: Diagnosis not present

## 2017-09-17 DIAGNOSIS — K219 Gastro-esophageal reflux disease without esophagitis: Secondary | ICD-10-CM | POA: Diagnosis not present

## 2017-09-17 DIAGNOSIS — E119 Type 2 diabetes mellitus without complications: Secondary | ICD-10-CM | POA: Diagnosis not present

## 2017-09-17 DIAGNOSIS — Z862 Personal history of diseases of the blood and blood-forming organs and certain disorders involving the immune mechanism: Secondary | ICD-10-CM | POA: Diagnosis not present

## 2017-09-17 DIAGNOSIS — I10 Essential (primary) hypertension: Secondary | ICD-10-CM | POA: Diagnosis not present

## 2017-09-17 DIAGNOSIS — E785 Hyperlipidemia, unspecified: Secondary | ICD-10-CM | POA: Diagnosis not present

## 2017-09-17 DIAGNOSIS — Z1389 Encounter for screening for other disorder: Secondary | ICD-10-CM | POA: Diagnosis not present

## 2017-09-18 DIAGNOSIS — E559 Vitamin D deficiency, unspecified: Secondary | ICD-10-CM | POA: Diagnosis not present

## 2017-09-18 DIAGNOSIS — E785 Hyperlipidemia, unspecified: Secondary | ICD-10-CM | POA: Diagnosis not present

## 2017-09-18 DIAGNOSIS — E119 Type 2 diabetes mellitus without complications: Secondary | ICD-10-CM | POA: Diagnosis not present

## 2017-09-24 ENCOUNTER — Ambulatory Visit: Payer: Medicare HMO | Admitting: Podiatry

## 2017-09-30 DIAGNOSIS — F39 Unspecified mood [affective] disorder: Secondary | ICD-10-CM | POA: Diagnosis not present

## 2017-10-06 ENCOUNTER — Ambulatory Visit: Payer: Medicare HMO | Admitting: Podiatry

## 2017-10-06 DIAGNOSIS — F39 Unspecified mood [affective] disorder: Secondary | ICD-10-CM | POA: Diagnosis not present

## 2017-10-09 DIAGNOSIS — F39 Unspecified mood [affective] disorder: Secondary | ICD-10-CM | POA: Diagnosis not present

## 2017-10-13 DIAGNOSIS — Z6828 Body mass index (BMI) 28.0-28.9, adult: Secondary | ICD-10-CM | POA: Diagnosis not present

## 2017-10-13 DIAGNOSIS — M0579 Rheumatoid arthritis with rheumatoid factor of multiple sites without organ or systems involvement: Secondary | ICD-10-CM | POA: Diagnosis not present

## 2017-10-13 DIAGNOSIS — R410 Disorientation, unspecified: Secondary | ICD-10-CM | POA: Diagnosis not present

## 2017-10-13 DIAGNOSIS — Z79899 Other long term (current) drug therapy: Secondary | ICD-10-CM | POA: Diagnosis not present

## 2017-10-13 DIAGNOSIS — R300 Dysuria: Secondary | ICD-10-CM | POA: Diagnosis not present

## 2017-10-13 DIAGNOSIS — M150 Primary generalized (osteo)arthritis: Secondary | ICD-10-CM | POA: Diagnosis not present

## 2017-10-13 DIAGNOSIS — F419 Anxiety disorder, unspecified: Secondary | ICD-10-CM | POA: Diagnosis not present

## 2017-10-13 DIAGNOSIS — M2550 Pain in unspecified joint: Secondary | ICD-10-CM | POA: Diagnosis not present

## 2017-10-13 DIAGNOSIS — E663 Overweight: Secondary | ICD-10-CM | POA: Diagnosis not present

## 2017-10-15 DIAGNOSIS — F39 Unspecified mood [affective] disorder: Secondary | ICD-10-CM | POA: Diagnosis not present

## 2017-10-28 DIAGNOSIS — F39 Unspecified mood [affective] disorder: Secondary | ICD-10-CM | POA: Diagnosis not present

## 2017-11-12 DIAGNOSIS — F39 Unspecified mood [affective] disorder: Secondary | ICD-10-CM | POA: Diagnosis not present

## 2017-11-13 DIAGNOSIS — M069 Rheumatoid arthritis, unspecified: Secondary | ICD-10-CM | POA: Diagnosis not present

## 2017-11-13 DIAGNOSIS — M79606 Pain in leg, unspecified: Secondary | ICD-10-CM | POA: Diagnosis not present

## 2017-11-17 DIAGNOSIS — M5431 Sciatica, right side: Secondary | ICD-10-CM | POA: Diagnosis not present

## 2017-11-17 DIAGNOSIS — M25561 Pain in right knee: Secondary | ICD-10-CM | POA: Diagnosis not present

## 2017-11-17 DIAGNOSIS — M0579 Rheumatoid arthritis with rheumatoid factor of multiple sites without organ or systems involvement: Secondary | ICD-10-CM | POA: Diagnosis not present

## 2017-11-17 DIAGNOSIS — E663 Overweight: Secondary | ICD-10-CM | POA: Diagnosis not present

## 2017-11-17 DIAGNOSIS — Z6827 Body mass index (BMI) 27.0-27.9, adult: Secondary | ICD-10-CM | POA: Diagnosis not present

## 2017-11-17 DIAGNOSIS — M150 Primary generalized (osteo)arthritis: Secondary | ICD-10-CM | POA: Diagnosis not present

## 2017-11-17 DIAGNOSIS — M2550 Pain in unspecified joint: Secondary | ICD-10-CM | POA: Diagnosis not present

## 2017-11-20 ENCOUNTER — Telehealth: Payer: Self-pay | Admitting: *Deleted

## 2017-11-20 NOTE — Telephone Encounter (Signed)
Received form requesting Pre-fab - Heated Molded Inserts [phone]) 3 pairs or Custom Molded Inserts [phone]) and diabetics shoes non-custom (A5500). Dr. Milinda Pointer states offer pt our diabetic shoes and inserts.

## 2017-11-20 NOTE — Telephone Encounter (Signed)
I informed pt of Dr. Stephenie Acres recommendation that she see our pedorthist to be fitted for diabetic shoes and inserts. Pt agreed and asked if she could have this performed 12/08/2017 appt. I told her I would make a note to do this at the next appt.

## 2017-12-08 ENCOUNTER — Ambulatory Visit: Payer: Medicare HMO | Admitting: Podiatry

## 2017-12-08 ENCOUNTER — Encounter: Payer: Self-pay | Admitting: Podiatry

## 2017-12-08 ENCOUNTER — Other Ambulatory Visit: Payer: Self-pay

## 2017-12-08 DIAGNOSIS — H16223 Keratoconjunctivitis sicca, not specified as Sjogren's, bilateral: Secondary | ICD-10-CM | POA: Diagnosis not present

## 2017-12-08 DIAGNOSIS — B351 Tinea unguium: Secondary | ICD-10-CM | POA: Diagnosis not present

## 2017-12-08 DIAGNOSIS — M79676 Pain in unspecified toe(s): Secondary | ICD-10-CM

## 2017-12-08 DIAGNOSIS — G629 Polyneuropathy, unspecified: Secondary | ICD-10-CM

## 2017-12-08 NOTE — Progress Notes (Signed)
She presents today for follow-up of her diabetic peripheral neuropathy and a chief complaint of painful elongated toenails.  She states that she stopped taking the gabapentin because it was making her so drowsy and she had heard bad things about it.  I really do not think that I have neuropathy.  Objective: Vital signs are stable alert and oriented x3.  No change in neurovascular status toenails are long thick yellow dystrophic-like mycotic no open lesions or wounds.  Muscle strength is normal symmetrical bilateral.  Deep tendon reflexes are symmetrical bilateral.  Tendons are long thick yellow dystrophic-like mycotic.  Assessment: Pain in limb secondary to diabetic peripheral neuropathy and pain in limb secondary to onychomycosis.  Plan: Discussed etiology pathology conservative versus surgical therapies.  At this point we are going to get her started with diabetic shoes and I went ahead and debrided her nails bilaterally.  I recommended that she follow-up with her primary for further options for neuropathy.

## 2017-12-10 DIAGNOSIS — F39 Unspecified mood [affective] disorder: Secondary | ICD-10-CM | POA: Diagnosis not present

## 2017-12-29 ENCOUNTER — Other Ambulatory Visit: Payer: Medicare HMO | Admitting: Orthotics

## 2018-01-04 ENCOUNTER — Other Ambulatory Visit: Payer: Self-pay | Admitting: Family Medicine

## 2018-01-04 DIAGNOSIS — Z1231 Encounter for screening mammogram for malignant neoplasm of breast: Secondary | ICD-10-CM

## 2018-01-07 DIAGNOSIS — F39 Unspecified mood [affective] disorder: Secondary | ICD-10-CM | POA: Diagnosis not present

## 2018-01-13 DIAGNOSIS — E663 Overweight: Secondary | ICD-10-CM | POA: Diagnosis not present

## 2018-01-13 DIAGNOSIS — M150 Primary generalized (osteo)arthritis: Secondary | ICD-10-CM | POA: Diagnosis not present

## 2018-01-13 DIAGNOSIS — Z79899 Other long term (current) drug therapy: Secondary | ICD-10-CM | POA: Diagnosis not present

## 2018-01-13 DIAGNOSIS — M2550 Pain in unspecified joint: Secondary | ICD-10-CM | POA: Diagnosis not present

## 2018-01-13 DIAGNOSIS — M0579 Rheumatoid arthritis with rheumatoid factor of multiple sites without organ or systems involvement: Secondary | ICD-10-CM | POA: Diagnosis not present

## 2018-01-13 DIAGNOSIS — Z6827 Body mass index (BMI) 27.0-27.9, adult: Secondary | ICD-10-CM | POA: Diagnosis not present

## 2018-01-20 DIAGNOSIS — F332 Major depressive disorder, recurrent severe without psychotic features: Secondary | ICD-10-CM | POA: Diagnosis not present

## 2018-01-21 DIAGNOSIS — F332 Major depressive disorder, recurrent severe without psychotic features: Secondary | ICD-10-CM | POA: Diagnosis not present

## 2018-01-22 DIAGNOSIS — F332 Major depressive disorder, recurrent severe without psychotic features: Secondary | ICD-10-CM | POA: Diagnosis not present

## 2018-01-25 DIAGNOSIS — F332 Major depressive disorder, recurrent severe without psychotic features: Secondary | ICD-10-CM | POA: Diagnosis not present

## 2018-01-26 DIAGNOSIS — F332 Major depressive disorder, recurrent severe without psychotic features: Secondary | ICD-10-CM | POA: Diagnosis not present

## 2018-01-27 DIAGNOSIS — F332 Major depressive disorder, recurrent severe without psychotic features: Secondary | ICD-10-CM | POA: Diagnosis not present

## 2018-01-28 DIAGNOSIS — F332 Major depressive disorder, recurrent severe without psychotic features: Secondary | ICD-10-CM | POA: Diagnosis not present

## 2018-01-29 DIAGNOSIS — F332 Major depressive disorder, recurrent severe without psychotic features: Secondary | ICD-10-CM | POA: Diagnosis not present

## 2018-02-01 DIAGNOSIS — F332 Major depressive disorder, recurrent severe without psychotic features: Secondary | ICD-10-CM | POA: Diagnosis not present

## 2018-02-02 DIAGNOSIS — F332 Major depressive disorder, recurrent severe without psychotic features: Secondary | ICD-10-CM | POA: Diagnosis not present

## 2018-02-03 DIAGNOSIS — F332 Major depressive disorder, recurrent severe without psychotic features: Secondary | ICD-10-CM | POA: Diagnosis not present

## 2018-02-08 DIAGNOSIS — F332 Major depressive disorder, recurrent severe without psychotic features: Secondary | ICD-10-CM | POA: Diagnosis not present

## 2018-02-09 ENCOUNTER — Ambulatory Visit: Payer: Self-pay

## 2018-02-09 DIAGNOSIS — F332 Major depressive disorder, recurrent severe without psychotic features: Secondary | ICD-10-CM | POA: Diagnosis not present

## 2018-02-10 DIAGNOSIS — F332 Major depressive disorder, recurrent severe without psychotic features: Secondary | ICD-10-CM | POA: Diagnosis not present

## 2018-02-10 DIAGNOSIS — F39 Unspecified mood [affective] disorder: Secondary | ICD-10-CM | POA: Diagnosis not present

## 2018-02-11 DIAGNOSIS — F332 Major depressive disorder, recurrent severe without psychotic features: Secondary | ICD-10-CM | POA: Diagnosis not present

## 2018-02-12 DIAGNOSIS — F332 Major depressive disorder, recurrent severe without psychotic features: Secondary | ICD-10-CM | POA: Diagnosis not present

## 2018-02-15 DIAGNOSIS — F332 Major depressive disorder, recurrent severe without psychotic features: Secondary | ICD-10-CM | POA: Diagnosis not present

## 2018-02-16 DIAGNOSIS — F332 Major depressive disorder, recurrent severe without psychotic features: Secondary | ICD-10-CM | POA: Diagnosis not present

## 2018-02-17 DIAGNOSIS — F332 Major depressive disorder, recurrent severe without psychotic features: Secondary | ICD-10-CM | POA: Diagnosis not present

## 2018-02-18 DIAGNOSIS — F332 Major depressive disorder, recurrent severe without psychotic features: Secondary | ICD-10-CM | POA: Diagnosis not present

## 2018-02-19 DIAGNOSIS — F332 Major depressive disorder, recurrent severe without psychotic features: Secondary | ICD-10-CM | POA: Diagnosis not present

## 2018-02-22 DIAGNOSIS — F332 Major depressive disorder, recurrent severe without psychotic features: Secondary | ICD-10-CM | POA: Diagnosis not present

## 2018-02-23 DIAGNOSIS — F332 Major depressive disorder, recurrent severe without psychotic features: Secondary | ICD-10-CM | POA: Diagnosis not present

## 2018-02-24 DIAGNOSIS — F332 Major depressive disorder, recurrent severe without psychotic features: Secondary | ICD-10-CM | POA: Diagnosis not present

## 2018-02-25 DIAGNOSIS — F332 Major depressive disorder, recurrent severe without psychotic features: Secondary | ICD-10-CM | POA: Diagnosis not present

## 2018-02-26 DIAGNOSIS — F332 Major depressive disorder, recurrent severe without psychotic features: Secondary | ICD-10-CM | POA: Diagnosis not present

## 2018-03-01 DIAGNOSIS — F332 Major depressive disorder, recurrent severe without psychotic features: Secondary | ICD-10-CM | POA: Diagnosis not present

## 2018-03-02 DIAGNOSIS — F332 Major depressive disorder, recurrent severe without psychotic features: Secondary | ICD-10-CM | POA: Diagnosis not present

## 2018-03-03 ENCOUNTER — Ambulatory Visit
Admission: RE | Admit: 2018-03-03 | Discharge: 2018-03-03 | Disposition: A | Discharge status: Home / Self Care | Payer: Medicare HMO | Source: Ambulatory Visit | Attending: Family Medicine | Admitting: Family Medicine

## 2018-03-03 DIAGNOSIS — F332 Major depressive disorder, recurrent severe without psychotic features: Secondary | ICD-10-CM | POA: Diagnosis not present

## 2018-03-03 DIAGNOSIS — Z1231 Encounter for screening mammogram for malignant neoplasm of breast: Secondary | ICD-10-CM

## 2018-03-04 DIAGNOSIS — H6121 Impacted cerumen, right ear: Secondary | ICD-10-CM | POA: Diagnosis not present

## 2018-03-04 DIAGNOSIS — I10 Essential (primary) hypertension: Secondary | ICD-10-CM | POA: Diagnosis not present

## 2018-03-04 DIAGNOSIS — F332 Major depressive disorder, recurrent severe without psychotic features: Secondary | ICD-10-CM | POA: Diagnosis not present

## 2018-03-04 DIAGNOSIS — R42 Dizziness and giddiness: Secondary | ICD-10-CM | POA: Diagnosis not present

## 2018-03-08 DIAGNOSIS — F332 Major depressive disorder, recurrent severe without psychotic features: Secondary | ICD-10-CM | POA: Diagnosis not present

## 2018-03-10 DIAGNOSIS — F332 Major depressive disorder, recurrent severe without psychotic features: Secondary | ICD-10-CM | POA: Diagnosis not present

## 2018-03-11 DIAGNOSIS — F39 Unspecified mood [affective] disorder: Secondary | ICD-10-CM | POA: Diagnosis not present

## 2018-03-11 DIAGNOSIS — F332 Major depressive disorder, recurrent severe without psychotic features: Secondary | ICD-10-CM | POA: Diagnosis not present

## 2018-03-16 DIAGNOSIS — F332 Major depressive disorder, recurrent severe without psychotic features: Secondary | ICD-10-CM | POA: Diagnosis not present

## 2018-03-18 DIAGNOSIS — F332 Major depressive disorder, recurrent severe without psychotic features: Secondary | ICD-10-CM | POA: Diagnosis not present

## 2018-03-19 DIAGNOSIS — F332 Major depressive disorder, recurrent severe without psychotic features: Secondary | ICD-10-CM | POA: Diagnosis not present

## 2018-04-15 DIAGNOSIS — Z6825 Body mass index (BMI) 25.0-25.9, adult: Secondary | ICD-10-CM | POA: Diagnosis not present

## 2018-04-15 DIAGNOSIS — M0579 Rheumatoid arthritis with rheumatoid factor of multiple sites without organ or systems involvement: Secondary | ICD-10-CM | POA: Diagnosis not present

## 2018-04-15 DIAGNOSIS — D539 Nutritional anemia, unspecified: Secondary | ICD-10-CM | POA: Diagnosis not present

## 2018-04-15 DIAGNOSIS — E559 Vitamin D deficiency, unspecified: Secondary | ICD-10-CM | POA: Diagnosis not present

## 2018-04-15 DIAGNOSIS — Z79899 Other long term (current) drug therapy: Secondary | ICD-10-CM | POA: Diagnosis not present

## 2018-04-15 DIAGNOSIS — M150 Primary generalized (osteo)arthritis: Secondary | ICD-10-CM | POA: Diagnosis not present

## 2018-04-15 DIAGNOSIS — L659 Nonscarring hair loss, unspecified: Secondary | ICD-10-CM | POA: Diagnosis not present

## 2018-04-15 DIAGNOSIS — F418 Other specified anxiety disorders: Secondary | ICD-10-CM | POA: Diagnosis not present

## 2018-04-15 DIAGNOSIS — R5383 Other fatigue: Secondary | ICD-10-CM | POA: Diagnosis not present

## 2018-04-15 DIAGNOSIS — E663 Overweight: Secondary | ICD-10-CM | POA: Diagnosis not present

## 2018-04-15 DIAGNOSIS — M2550 Pain in unspecified joint: Secondary | ICD-10-CM | POA: Diagnosis not present

## 2018-04-21 DIAGNOSIS — F332 Major depressive disorder, recurrent severe without psychotic features: Secondary | ICD-10-CM | POA: Diagnosis not present

## 2018-04-22 ENCOUNTER — Ambulatory Visit: Payer: Medicare HMO | Admitting: Psychiatry

## 2018-04-22 DIAGNOSIS — F411 Generalized anxiety disorder: Secondary | ICD-10-CM

## 2018-04-22 DIAGNOSIS — F429 Obsessive-compulsive disorder, unspecified: Secondary | ICD-10-CM | POA: Diagnosis not present

## 2018-04-22 DIAGNOSIS — F0630 Mood disorder due to known physiological condition, unspecified: Secondary | ICD-10-CM | POA: Diagnosis not present

## 2018-04-22 MED ORDER — ALPRAZOLAM 0.5 MG PO TABS: 0.5000 mg | ORAL_TABLET | Freq: Three times a day (TID) | ORAL | 0 refills | Status: DC

## 2018-04-22 MED ORDER — PAROXETINE HCL 40 MG PO TABS: 60.0000 mg | ORAL_TABLET | Freq: Every day | ORAL | 0 refills | Status: DC

## 2018-04-22 NOTE — Patient Instructions (Signed)
Increase paxil to 60 mg /day

## 2018-04-22 NOTE — Progress Notes (Addendum)
Will refer patient for booster ofo [place]  Patient ID: [name],  MRN: [id number]  PCP: Leighton Ruff, MD  Date of Evaluation: 04/22/2018 Time spent:20 minutes   HISTORY/CURRENT STATUS: HPI patient is a 72 year old white female.  She is being treated for mood disorder OCD and anxiety.  She was last seen 03/11/2018 and was doing well.  She was undergoing [place]. Patient did well for several weeks after her [place]. She has been through with tms for 6-7 weeks. Recent testing with worsening of depression. The last month she has not been doing as well depression and decreased energy and motivation never happy not crying but having increased sleep and has anhedonia.  She has found that she has an abnormal thyroid this will be worked up by her endocrinologist.  She does state that she has had suicidal thoughts several times over the last month and this is new for her.  Suicidal plan pills.  Individual Medical History/ Review of Systems: Changes? :No  Allergies: Contrast media [iodinated diagnostic agents]  Current Medications:  Current Outpatient Medications:  .  ACCU-CHEK FASTCLIX LANCETS MISC, CHECK BLOOD SUGAR ONCE A DAY, Disp: , Rfl: 3 .  ACCU-CHEK GUIDE test strip, CHECK BLOOD SUGAR ONCE A DAY, Disp: , Rfl: 3 .  albuterol (PROVENTIL HFA;VENTOLIN HFA) 108 (90 Base) MCG/ACT inhaler, Inhale 1-2 puffs into the lungs every 6 (six) hours as needed for wheezing or shortness of breath., Disp: , Rfl:  .  ALPRAZolam (XANAX) 0.5 MG tablet, Take 0.25-0.5 mg by mouth 3 (three) times daily as needed for anxiety. , Disp: , Rfl:  .  ARIPiprazole (ABILIFY) 10 MG tablet, Take 10 mg by mouth daily., Disp: , Rfl: 1 .  aspirin EC 81 MG tablet, Take 81 mg by mouth daily., Disp: , Rfl:  .  atorvastatin (LIPITOR) 40 MG tablet, Take 40 mg by mouth daily., Disp: , Rfl:  .  cephALEXin (KEFLEX) 500 MG capsule, Take 1 capsule (500 mg total) by mouth 3 (three) times daily., Disp: 15 capsule,  Rfl: 0 .  cetirizine (ZYRTEC) 10 MG tablet, Take 10 mg by mouth daily as needed for allergies., Disp: , Rfl:  .  Cholecalciferol (VITAMIN D) 2000 UNITS tablet, Take 2,000 Units by mouth daily., Disp: , Rfl:  .  cycloSPORINE (RESTASIS) 0.05 % ophthalmic emulsion, Place 1 drop into both eyes 2 (two) times daily., Disp: , Rfl:  .  Ergocalciferol (VITAMIN D2 PO), Vitamin D2, Disp: , Rfl:  .  ferrous sulfate 325 (65 FE) MG EC tablet, Take 325 mg by mouth 3 (three) times daily with meals., Disp: , Rfl:  .  fluticasone (FLONASE) 50 MCG/ACT nasal spray, Place 1-2 sprays into both nostrils daily., Disp: , Rfl:  .  folic acid (FOLVITE) 1 MG tablet, Take 2-3 mg by mouth daily. If feeling tired take 3 tablets daily, Disp: , Rfl:  .  gabapentin (NEURONTIN) 800 MG tablet, Take half a tablet 3 times a day. (Patient not taking: Reported on 12/08/2017), Disp: 90 tablet, Rfl: 2 .  HUMIRA PEDIATRIC CROHNS START 40 MG/0.8ML PSKT, Inject 1 each into the skin every 14 (fourteen) days. , Disp: , Rfl:  .  hydroxychloroquine (PLAQUENIL) 200 MG tablet, Take 400 mg by mouth daily., Disp: , Rfl:  .  IRON-FOLIC ACID-VIT M08 PO, iron 65 QP-61 mg-folic acid 9,509 mcg (32)-IZTI with C#12-succ. tablet, Disp: , Rfl:  .  leflunomide (ARAVA) 20 MG tablet, Take 20 mg by mouth  daily., Disp: , Rfl:  .  lisinopril (PRINIVIL,ZESTRIL) 10 MG tablet, Take 10 mg by mouth daily., Disp: , Rfl: 1 .  methotrexate (RHEUMATREX) 2.5 MG tablet, 15 mg once a week. Wednesday, Disp: , Rfl:  .  metoprolol succinate (TOPROL-XL) 25 MG 24 hr tablet, Take 25 mg by mouth daily., Disp: , Rfl:  .  metroNIDAZOLE (METROGEL) 0.75 % gel, APPLY TO AFFECTED AREA EVERY DAY, Disp: , Rfl: 2 .  OXcarbazepine (TRILEPTAL) 150 MG tablet, Take 150 mg by mouth daily., Disp: , Rfl:  .  PARoxetine (PAXIL) 30 MG tablet, TAKE 1 TABLET DAILY FOR 1 WEEK, 2 TABS DAILY FOR 1 WEEK, 3 TABS DAILY THEREAFTER, Disp: , Rfl: 1 .  predniSONE (DELTASONE) 5 MG tablet, Take 5 mg by mouth daily  with breakfast., Disp: , Rfl:  .  sertraline (ZOLOFT) 100 MG tablet, Take 150 mg by mouth daily. , Disp: , Rfl:  Medication Side Effects: none  Family Medical/ Social History: Changes? No  MENTAL HEALTH EXAM:  There were no vitals taken for this visit.There is no height or weight on file to calculate BMI.  General Appearance: Casual  Eye Contact:  Good  Speech:  Normal Rate  Volume:  Normal  Mood:  Depressed  Affect:  Appropriate  Thought Process:  Linear  Orientation:  Full (Time, Place, and Person)  Thought Content: Logical   Suicidal Thoughts:  Yes.  with intent/plan  Homicidal Thoughts:  No  Memory:  normal  Judgement:  Good  Insight:  Good  Psychomotor Activity:  Normal  Concentration:  Concentration: Good  Recall:  Good  Fund of Knowledge: Good  Language: Good  Akathisia:  NA  AIMS (if indicated): na  Assets:  Desire for Improvement  ADL's:  Intact  Cognition: WNL  Prognosis:  Fair    DIAGNOSES: mood disorder, ocd, anxiety.Suicidal thoughts with plan to take pills. This is new for her  RECOMMENDATIONS: increase paxil Commits to safety    Comer Locket, PA-C

## 2018-04-27 DIAGNOSIS — R946 Abnormal results of thyroid function studies: Secondary | ICD-10-CM | POA: Diagnosis not present

## 2018-04-27 DIAGNOSIS — Z8639 Personal history of other endocrine, nutritional and metabolic disease: Secondary | ICD-10-CM | POA: Diagnosis not present

## 2018-04-27 DIAGNOSIS — E119 Type 2 diabetes mellitus without complications: Secondary | ICD-10-CM | POA: Diagnosis not present

## 2018-05-11 DIAGNOSIS — H35371 Puckering of macula, right eye: Secondary | ICD-10-CM | POA: Diagnosis not present

## 2018-05-11 DIAGNOSIS — H2511 Age-related nuclear cataract, right eye: Secondary | ICD-10-CM | POA: Diagnosis not present

## 2018-05-11 DIAGNOSIS — H353121 Nonexudative age-related macular degeneration, left eye, early dry stage: Secondary | ICD-10-CM | POA: Diagnosis not present

## 2018-05-11 DIAGNOSIS — H43811 Vitreous degeneration, right eye: Secondary | ICD-10-CM | POA: Diagnosis not present

## 2018-05-17 ENCOUNTER — Ambulatory Visit: Payer: Medicare HMO | Admitting: Psychiatry

## 2018-05-17 DIAGNOSIS — F429 Obsessive-compulsive disorder, unspecified: Secondary | ICD-10-CM

## 2018-05-17 DIAGNOSIS — F0630 Mood disorder due to known physiological condition, unspecified: Secondary | ICD-10-CM | POA: Diagnosis not present

## 2018-05-17 DIAGNOSIS — E119 Type 2 diabetes mellitus without complications: Secondary | ICD-10-CM | POA: Diagnosis not present

## 2018-05-17 DIAGNOSIS — F331 Major depressive disorder, recurrent, moderate: Secondary | ICD-10-CM | POA: Diagnosis not present

## 2018-05-17 DIAGNOSIS — Z8639 Personal history of other endocrine, nutritional and metabolic disease: Secondary | ICD-10-CM | POA: Diagnosis not present

## 2018-05-17 DIAGNOSIS — F411 Generalized anxiety disorder: Secondary | ICD-10-CM

## 2018-05-17 DIAGNOSIS — R946 Abnormal results of thyroid function studies: Secondary | ICD-10-CM | POA: Diagnosis not present

## 2018-05-17 MED ORDER — PAROXETINE HCL 40 MG PO TABS: 60.0000 mg | ORAL_TABLET | Freq: Every day | ORAL | 0 refills | Status: DC

## 2018-05-17 MED ORDER — ALPRAZOLAM 0.5 MG PO TABS: 0.5000 mg | ORAL_TABLET | Freq: Three times a day (TID) | ORAL | 0 refills | Status: DC

## 2018-05-17 NOTE — Progress Notes (Signed)
[facility]  Patient ID: [name],  MRN: [id number]  PCP: Leighton Ruff, MD  Date of Evaluation: 05/17/2018 Time spent:20 minutes  Chief Complaint:   HISTORY/CURRENT STATUS: HPI patient is a 72 year old white female who last seen 04/22/2018.  He carries a diagnosis of mood disorder, OCD, anxiety, depression.  At her last visit her depression was worse and she had suicidal thoughts and plans for the first time ever.  At that time we increased her Paxil continue her on the Xanax 0.5 3 times daily. She did well for 3 weeks on the increased dose of Paxil.  Over the last 4 days her depression is worse and anxiety is worse  Denies suicidal thoughts    Individual Medical History/ Review of Systems: Changes? :No   Allergies: Contrast media [iodinated diagnostic agents]; Abilify [aripiprazole]; Ultram [tramadol hcl]; and Zithromax [azithromycin] abilify, ultram  Current Medications:  Current Outpatient Medications:  .  ALPRAZolam (XANAX) 0.5 MG tablet, Take 1 tablet (0.5 mg total) by mouth 3 (three) times daily., Disp: 90 tablet, Rfl: 0 .  atorvastatin (LIPITOR) 40 MG tablet, Take 40 mg by mouth daily., Disp: , Rfl:  .  Cholecalciferol (VITAMIN D) 2000 UNITS tablet, Take 2,000 Units by mouth daily., Disp: , Rfl:  .  cycloSPORINE (RESTASIS) 0.05 % ophthalmic emulsion, Place 1 drop into both eyes 2 (two) times daily., Disp: , Rfl:  .  ferrous sulfate 325 (65 FE) MG EC tablet, Take 325 mg by mouth 3 (three) times daily with meals., Disp: , Rfl:  .  fluticasone (FLONASE) 50 MCG/ACT nasal spray, Place 1-2 sprays into both nostrils daily., Disp: , Rfl:  .  folic acid (FOLVITE) 1 MG tablet, Take 2-3 mg by mouth daily. If feeling tired take 3 tablets daily, Disp: , Rfl:  .  HUMIRA PEDIATRIC CROHNS START 40 MG/0.8ML PSKT, Inject 1 each into the skin every 14 (fourteen) days. , Disp: , Rfl:  .  hydroxychloroquine (PLAQUENIL) 200 MG tablet, Take 400 mg by mouth daily., Disp: ,  Rfl:  .  leflunomide (ARAVA) 20 MG tablet, Take 20 mg by mouth daily., Disp: , Rfl:  .  lisinopril (PRINIVIL,ZESTRIL) 10 MG tablet, Take 10 mg by mouth daily., Disp: , Rfl: 1 .  methotrexate (RHEUMATREX) 2.5 MG tablet, 15 mg once a week. Wednesday, Disp: , Rfl:  .  metoprolol succinate (TOPROL-XL) 25 MG 24 hr tablet, Take 25 mg by mouth daily., Disp: , Rfl:  .  PARoxetine (PAXIL) 40 MG tablet, Take 1.5 tablets (60 mg total) by mouth daily., Disp: 45 tablet, Rfl: 0 .  predniSONE (DELTASONE) 5 MG tablet, Take 5 mg by mouth daily with breakfast., Disp: , Rfl:  .  ACCU-CHEK FASTCLIX LANCETS MISC, CHECK BLOOD SUGAR ONCE A DAY, Disp: , Rfl: 3 .  ACCU-CHEK GUIDE test strip, CHECK BLOOD SUGAR ONCE A DAY, Disp: , Rfl: 3 .  albuterol (PROVENTIL HFA;VENTOLIN HFA) 108 (90 Base) MCG/ACT inhaler, Inhale 1-2 puffs into the lungs every 6 (six) hours as needed for wheezing or shortness of breath., Disp: , Rfl:  .  ARIPiprazole (ABILIFY) 10 MG tablet, Take 10 mg by mouth daily., Disp: , Rfl: 1 .  aspirin EC 81 MG tablet, Take 81 mg by mouth daily., Disp: , Rfl:  .  cephALEXin (KEFLEX) 500 MG capsule, Take 1 capsule (500 mg total) by mouth 3 (three) times daily. (Patient not taking: Reported on 04/22/2018), Disp: 15 capsule, Rfl: 0 .  cetirizine (ZYRTEC) 10 MG tablet, Take 10  mg by mouth daily as needed for allergies., Disp: , Rfl:  .  Ergocalciferol (VITAMIN D2 PO), Vitamin D2, Disp: , Rfl:  .  IRON-FOLIC ACID-VIT Y09 PO, iron 65 XI-33 mg-folic acid 8,250 mcg (53)-ZJQB with C#12-succ. tablet, Disp: , Rfl:  .  metroNIDAZOLE (METROGEL) 0.75 % gel, APPLY TO AFFECTED AREA EVERY DAY, Disp: , Rfl: 2 Medication Side Effects: none  Family Medical/ Social History: Changes? No  MENTAL HEALTH EXAM:  There were no vitals taken for this visit.There is no height or weight on file to calculate BMI.  General Appearance: Casual  Eye Contact:  Good  Speech:  Normal Rate  Volume:  Normal  Mood:  Depressed anxious  Affect:   Appropriate  Thought Process:  Linear  Orientation:  Full (Time, Place, and Person)  Thought Content: Logical   Suicidal Thoughts:  No  Homicidal Thoughts:  No  Memory:  WNL  Judgement:  Fair  Insight:  Fair  Psychomotor Activity:  Normal  Concentration:  Concentration: Fair  Recall:  fair  Fund of Knowledge: Good  Language: Good  Assets:  Desire for Improvement  ADL's:  Intact  Cognition: WNL  Prognosis:  Good    DIAGNOSES:    ICD-10-CM   1. Mood disorder in conditions classified elsewhere F06.30   2. Obsessive-compulsive disorder, unspecified type F42.9   3. Anxiety state F41.1     Receiving Psychotherapy: No    RECOMMENDATIONS: She gets very frustrated during the exam.  Apologizes for her lack of concentration.  Several medications were mentioned include Mirapex and Remeron.  She wants to wait and think about it and let me know.  As mentioned Remeron is an option so will use Mirapex.  Consider lithium but she is on a ACE inhibitor. Will make sure new med doesn't interfere with her current meds Recheck 3 weeks. Ok to call if she wants to.   Comer Locket, PA-C

## 2018-05-24 ENCOUNTER — Emergency Department (HOSPITAL_COMMUNITY): Payer: Medicare HMO

## 2018-05-24 ENCOUNTER — Other Ambulatory Visit: Payer: Self-pay

## 2018-05-24 ENCOUNTER — Observation Stay (HOSPITAL_COMMUNITY)
Admission: EM | Admit: 2018-05-24 | Discharge: 2018-06-02 | Disposition: A | Discharge status: Other Acute Inpatient Hospital | Payer: Medicare HMO | Attending: Family Medicine | Admitting: Family Medicine

## 2018-05-24 ENCOUNTER — Encounter (HOSPITAL_COMMUNITY): Payer: Self-pay | Admitting: Emergency Medicine

## 2018-05-24 DIAGNOSIS — R630 Anorexia: Secondary | ICD-10-CM | POA: Diagnosis not present

## 2018-05-24 DIAGNOSIS — Z7982 Long term (current) use of aspirin: Secondary | ICD-10-CM | POA: Insufficient documentation

## 2018-05-24 DIAGNOSIS — E119 Type 2 diabetes mellitus without complications: Secondary | ICD-10-CM | POA: Diagnosis not present

## 2018-05-24 DIAGNOSIS — E785 Hyperlipidemia, unspecified: Secondary | ICD-10-CM | POA: Diagnosis present

## 2018-05-24 DIAGNOSIS — Z833 Family history of diabetes mellitus: Secondary | ICD-10-CM | POA: Diagnosis not present

## 2018-05-24 DIAGNOSIS — F329 Major depressive disorder, single episode, unspecified: Secondary | ICD-10-CM | POA: Diagnosis not present

## 2018-05-24 DIAGNOSIS — M069 Rheumatoid arthritis, unspecified: Secondary | ICD-10-CM | POA: Diagnosis present

## 2018-05-24 DIAGNOSIS — R000 Tachycardia, unspecified: Secondary | ICD-10-CM | POA: Diagnosis not present

## 2018-05-24 DIAGNOSIS — E1165 Type 2 diabetes mellitus with hyperglycemia: Secondary | ICD-10-CM | POA: Insufficient documentation

## 2018-05-24 DIAGNOSIS — Z981 Arthrodesis status: Secondary | ICD-10-CM | POA: Insufficient documentation

## 2018-05-24 DIAGNOSIS — R509 Fever, unspecified: Secondary | ICD-10-CM | POA: Diagnosis not present

## 2018-05-24 DIAGNOSIS — Z881 Allergy status to other antibiotic agents status: Secondary | ICD-10-CM | POA: Diagnosis not present

## 2018-05-24 DIAGNOSIS — R0602 Shortness of breath: Secondary | ICD-10-CM

## 2018-05-24 DIAGNOSIS — F419 Anxiety disorder, unspecified: Secondary | ICD-10-CM | POA: Insufficient documentation

## 2018-05-24 DIAGNOSIS — R0902 Hypoxemia: Secondary | ICD-10-CM | POA: Diagnosis not present

## 2018-05-24 DIAGNOSIS — Z8249 Family history of ischemic heart disease and other diseases of the circulatory system: Secondary | ICD-10-CM | POA: Insufficient documentation

## 2018-05-24 DIAGNOSIS — G4700 Insomnia, unspecified: Secondary | ICD-10-CM | POA: Insufficient documentation

## 2018-05-24 DIAGNOSIS — M48062 Spinal stenosis, lumbar region with neurogenic claudication: Secondary | ICD-10-CM | POA: Insufficient documentation

## 2018-05-24 DIAGNOSIS — R4182 Altered mental status, unspecified: Secondary | ICD-10-CM | POA: Diagnosis present

## 2018-05-24 DIAGNOSIS — R262 Difficulty in walking, not elsewhere classified: Secondary | ICD-10-CM | POA: Insufficient documentation

## 2018-05-24 DIAGNOSIS — Z820 Family history of epilepsy and other diseases of the nervous system: Secondary | ICD-10-CM | POA: Insufficient documentation

## 2018-05-24 DIAGNOSIS — Z9049 Acquired absence of other specified parts of digestive tract: Secondary | ICD-10-CM | POA: Diagnosis not present

## 2018-05-24 DIAGNOSIS — R824 Acetonuria: Secondary | ICD-10-CM | POA: Diagnosis not present

## 2018-05-24 DIAGNOSIS — F429 Obsessive-compulsive disorder, unspecified: Secondary | ICD-10-CM | POA: Diagnosis not present

## 2018-05-24 DIAGNOSIS — E890 Postprocedural hypothyroidism: Secondary | ICD-10-CM | POA: Insufficient documentation

## 2018-05-24 DIAGNOSIS — E7800 Pure hypercholesterolemia, unspecified: Secondary | ICD-10-CM | POA: Diagnosis not present

## 2018-05-24 DIAGNOSIS — M6281 Muscle weakness (generalized): Secondary | ICD-10-CM | POA: Diagnosis not present

## 2018-05-24 DIAGNOSIS — F333 Major depressive disorder, recurrent, severe with psychotic symptoms: Principal | ICD-10-CM

## 2018-05-24 DIAGNOSIS — E876 Hypokalemia: Secondary | ICD-10-CM | POA: Insufficient documentation

## 2018-05-24 DIAGNOSIS — I10 Essential (primary) hypertension: Secondary | ICD-10-CM | POA: Diagnosis not present

## 2018-05-24 DIAGNOSIS — Z79899 Other long term (current) drug therapy: Secondary | ICD-10-CM | POA: Diagnosis not present

## 2018-05-24 DIAGNOSIS — Z90710 Acquired absence of both cervix and uterus: Secondary | ICD-10-CM | POA: Diagnosis not present

## 2018-05-24 DIAGNOSIS — Z8349 Family history of other endocrine, nutritional and metabolic diseases: Secondary | ICD-10-CM | POA: Insufficient documentation

## 2018-05-24 DIAGNOSIS — R40241 Glasgow coma scale score 13-15, unspecified time: Secondary | ICD-10-CM | POA: Diagnosis not present

## 2018-05-24 DIAGNOSIS — R4020 Unspecified coma: Secondary | ICD-10-CM | POA: Diagnosis not present

## 2018-05-24 DIAGNOSIS — Z91041 Radiographic dye allergy status: Secondary | ICD-10-CM | POA: Insufficient documentation

## 2018-05-24 DIAGNOSIS — R2681 Unsteadiness on feet: Secondary | ICD-10-CM | POA: Diagnosis not present

## 2018-05-24 DIAGNOSIS — R404 Transient alteration of awareness: Secondary | ICD-10-CM | POA: Diagnosis not present

## 2018-05-24 DIAGNOSIS — F32A Depression, unspecified: Secondary | ICD-10-CM | POA: Diagnosis present

## 2018-05-24 DIAGNOSIS — Z803 Family history of malignant neoplasm of breast: Secondary | ICD-10-CM | POA: Insufficient documentation

## 2018-05-24 DIAGNOSIS — J9811 Atelectasis: Secondary | ICD-10-CM | POA: Diagnosis not present

## 2018-05-24 LAB — COMPREHENSIVE METABOLIC PANEL
ALK PHOS: 70 U/L (ref 38–126)
ALT: 42 U/L (ref 0–44)
AST: 28 U/L (ref 15–41)
Albumin: 3.8 g/dL (ref 3.5–5.0)
Anion gap: 9 (ref 5–15)
BILIRUBIN TOTAL: 1 mg/dL (ref 0.3–1.2)
BUN: 16 mg/dL (ref 8–23)
CALCIUM: 8.5 mg/dL — AB (ref 8.9–10.3)
CO2: 24 mmol/L (ref 22–32)
Chloride: 108 mmol/L (ref 98–111)
Creatinine, Ser: 0.68 mg/dL (ref 0.44–1.00)
GFR calc Af Amer: >60 mL/min (ref >60)
GFR calc non Af Amer: >60 mL/min (ref >60)
GLUCOSE: 103 mg/dL — AB (ref 70–99)
Potassium: 3.4 mmol/L — ABNORMAL LOW (ref 3.5–5.1)
Sodium: 141 mmol/L (ref 135–145)
TOTAL PROTEIN: 6.1 g/dL — AB (ref 6.5–8.1)

## 2018-05-24 LAB — I-STAT VENOUS BLOOD GAS, ED
Bicarbonate: 26.3 mmol/L (ref 20.0–28.0)
O2 SAT: 59 %
TCO2: 28 mmol/L (ref 22–32)
pCO2, Ven: 49.6 mmHg (ref 44.0–60.0)
pH, Ven: 7.332 (ref 7.250–7.430)
pO2, Ven: 33 mmHg (ref 32.0–45.0)

## 2018-05-24 LAB — URINALYSIS, ROUTINE W REFLEX MICROSCOPIC
BILIRUBIN URINE: NEGATIVE
Glucose, UA: NEGATIVE mg/dL
HGB URINE DIPSTICK: NEGATIVE
KETONES UR: 20 mg/dL — AB
LEUKOCYTES UA: NEGATIVE
Nitrite: NEGATIVE
PROTEIN: 30 mg/dL — AB
Specific Gravity, Urine: 1.024 (ref 1.005–1.030)
pH: 5 (ref 5.0–8.0)

## 2018-05-24 LAB — I-STAT CHEM 8, ED
BUN: 22 mg/dL (ref 8–23)
CALCIUM ION: 0.95 mmol/L — AB (ref 1.15–1.40)
CREATININE: 0.6 mg/dL (ref 0.44–1.00)
Chloride: 108 mmol/L (ref 98–111)
GLUCOSE: 97 mg/dL (ref 70–99)
HCT: 37 % (ref 36.0–46.0)
Hemoglobin: 12.6 g/dL (ref 12.0–15.0)
Potassium: 3.4 mmol/L — ABNORMAL LOW (ref 3.5–5.1)
Sodium: 141 mmol/L (ref 135–145)
TCO2: 28 mmol/L (ref 22–32)

## 2018-05-24 LAB — CBC WITH DIFFERENTIAL/PLATELET
ABS IMMATURE GRANULOCYTES: 0.01 10*3/uL (ref 0.00–0.07)
Basophils Absolute: 0 10*3/uL (ref 0.0–0.1)
Basophils Relative: 1 %
Eosinophils Absolute: 0.1 10*3/uL (ref 0.0–0.5)
Eosinophils Relative: 2 %
HEMATOCRIT: 39.8 % (ref 36.0–46.0)
HEMOGLOBIN: 12 g/dL (ref 12.0–15.0)
Immature Granulocytes: 0 %
LYMPHS ABS: 0.6 10*3/uL — AB (ref 0.7–4.0)
Lymphocytes Relative: 13 %
MCH: 32.3 pg (ref 26.0–34.0)
MCHC: 30.2 g/dL (ref 30.0–36.0)
MCV: 107 fL — ABNORMAL HIGH (ref 80.0–100.0)
MONO ABS: 0.7 10*3/uL (ref 0.1–1.0)
Monocytes Relative: 16 %
NEUTROS ABS: 2.8 10*3/uL (ref 1.7–7.7)
Neutrophils Relative %: 68 %
Platelets: 138 10*3/uL — ABNORMAL LOW (ref 150–400)
RBC: 3.72 MIL/uL — ABNORMAL LOW (ref 3.87–5.11)
RDW: 14.2 % (ref 11.5–15.5)
WBC: 4.2 10*3/uL (ref 4.0–10.5)
nRBC: 0 % (ref 0.0–0.2)

## 2018-05-24 LAB — CBG MONITORING, ED: Glucose-Capillary: 100 mg/dL — ABNORMAL HIGH (ref 70–99)

## 2018-05-24 LAB — I-STAT CG4 LACTIC ACID, ED
LACTIC ACID, VENOUS: 1.17 mmol/L (ref 0.5–1.9)
Lactic Acid, Venous: 0.93 mmol/L (ref 0.5–1.9)

## 2018-05-24 LAB — SALICYLATE LEVEL: Salicylate Lvl: <7 mg/dL (ref 2.8–30.0)

## 2018-05-24 LAB — I-STAT TROPONIN, ED: TROPONIN I, POC: 0.01 ng/mL (ref 0.00–0.08)

## 2018-05-24 LAB — MRSA PCR SCREENING: MRSA BY PCR: NEGATIVE

## 2018-05-24 LAB — AMMONIA: Ammonia: 13 umol/L (ref 9–35)

## 2018-05-24 LAB — ETHANOL: Alcohol, Ethyl (B): <10 mg/dL (ref <10)

## 2018-05-24 LAB — ACETAMINOPHEN LEVEL: Acetaminophen (Tylenol), Serum: <10 ug/mL — ABNORMAL LOW (ref 10–30)

## 2018-05-24 MED ORDER — HYDROXYCHLOROQUINE SULFATE 200 MG PO TABS
400.0000 mg | ORAL_TABLET | Freq: Every day | ORAL | Status: DC
  Administered 2018-05-25 – 2018-06-02 (×8): 400 mg via ORAL
  Filled 2018-05-24 (×8): qty 2

## 2018-05-24 MED ORDER — ORAL CARE MOUTH RINSE
15.0000 mL | Freq: Two times a day (BID) | OROMUCOSAL | Status: DC
  Administered 2018-05-25 – 2018-06-02 (×9): 15 mL via OROMUCOSAL

## 2018-05-24 MED ORDER — LACTATED RINGERS IV SOLN
INTRAVENOUS | Status: DC
  Administered 2018-05-24 – 2018-05-25 (×2): via INTRAVENOUS

## 2018-05-24 MED ORDER — METOPROLOL SUCCINATE ER 25 MG PO TB24
25.0000 mg | ORAL_TABLET | Freq: Every day | ORAL | Status: DC
  Administered 2018-05-25: 25 mg via ORAL
  Filled 2018-05-24: qty 1

## 2018-05-24 MED ORDER — ENOXAPARIN SODIUM 40 MG/0.4ML ~~LOC~~ SOLN
40.0000 mg | SUBCUTANEOUS | Status: DC
  Administered 2018-05-24 – 2018-06-02 (×8): 40 mg via SUBCUTANEOUS
  Filled 2018-05-24 (×10): qty 0.4

## 2018-05-24 MED ORDER — LISINOPRIL 10 MG PO TABS
10.0000 mg | ORAL_TABLET | Freq: Every day | ORAL | Status: DC
  Administered 2018-05-25 – 2018-06-02 (×8): 10 mg via ORAL
  Filled 2018-05-24 (×8): qty 1

## 2018-05-24 MED ORDER — ONDANSETRON HCL 4 MG/2ML IJ SOLN: 4.0000 mg | Freq: Four times a day (QID) | INTRAMUSCULAR | Status: DC | PRN

## 2018-05-24 MED ORDER — LEFLUNOMIDE 20 MG PO TABS
20.0000 mg | ORAL_TABLET | Freq: Every day | ORAL | Status: DC
  Administered 2018-05-25 – 2018-06-02 (×8): 20 mg via ORAL
  Filled 2018-05-24 (×9): qty 1

## 2018-05-24 MED ORDER — CYCLOSPORINE 0.05 % OP EMUL
1.0000 [drp] | Freq: Two times a day (BID) | OPHTHALMIC | Status: DC
  Administered 2018-05-24 – 2018-06-02 (×15): 1 [drp] via OPHTHALMIC
  Filled 2018-05-24 (×19): qty 1

## 2018-05-24 MED ORDER — SODIUM CHLORIDE 0.9 % IV BOLUS
1000.0000 mL | Freq: Once | INTRAVENOUS | Status: AC
  Administered 2018-05-24: 1000 mL via INTRAVENOUS

## 2018-05-24 MED ORDER — HYDROCORTISONE NA SUCCINATE PF 100 MG IJ SOLR
100.0000 mg | Freq: Once | INTRAMUSCULAR | Status: AC
  Administered 2018-05-24: 100 mg via INTRAVENOUS
  Filled 2018-05-24: qty 2

## 2018-05-24 MED ORDER — ACETAMINOPHEN 650 MG RE SUPP: 650.0000 mg | Freq: Four times a day (QID) | RECTAL | Status: DC | PRN

## 2018-05-24 MED ORDER — SODIUM CHLORIDE 0.9 % IV SOLN
2.0000 g | Freq: Once | INTRAVENOUS | Status: AC
  Administered 2018-05-24: 2 g via INTRAVENOUS
  Filled 2018-05-24: qty 2

## 2018-05-24 MED ORDER — ONDANSETRON HCL 4 MG PO TABS: 4.0000 mg | ORAL_TABLET | Freq: Four times a day (QID) | ORAL | Status: DC | PRN

## 2018-05-24 MED ORDER — FLUTICASONE PROPIONATE 50 MCG/ACT NA SUSP
1.0000 | Freq: Every day | NASAL | Status: DC
  Administered 2018-05-26 – 2018-05-27 (×2): 2 via NASAL
  Administered 2018-05-28: 1 via NASAL
  Administered 2018-05-30: 2 via NASAL
  Administered 2018-05-31 – 2018-06-02 (×2): 1 via NASAL
  Filled 2018-05-24: qty 16

## 2018-05-24 MED ORDER — VANCOMYCIN HCL IN DEXTROSE 1-5 GM/200ML-% IV SOLN
1000.0000 mg | Freq: Once | INTRAVENOUS | Status: AC
  Administered 2018-05-24: 1000 mg via INTRAVENOUS
  Filled 2018-05-24: qty 200

## 2018-05-24 MED ORDER — METRONIDAZOLE IN NACL 5-0.79 MG/ML-% IV SOLN
500.0000 mg | Freq: Three times a day (TID) | INTRAVENOUS | Status: DC
  Administered 2018-05-24: 500 mg via INTRAVENOUS
  Filled 2018-05-24: qty 100

## 2018-05-24 MED ORDER — NALOXONE HCL 2 MG/2ML IJ SOSY
PREFILLED_SYRINGE | INTRAMUSCULAR | Status: AC | PRN
  Administered 2018-05-24: 0.4 mg via INTRAVENOUS

## 2018-05-24 MED ORDER — CHLORHEXIDINE GLUCONATE 0.12 % MT SOLN
15.0000 mL | Freq: Two times a day (BID) | OROMUCOSAL | Status: DC
  Administered 2018-05-25 – 2018-06-02 (×9): 15 mL via OROMUCOSAL
  Filled 2018-05-24 (×13): qty 15

## 2018-05-24 MED ORDER — ATORVASTATIN CALCIUM 40 MG PO TABS
40.0000 mg | ORAL_TABLET | Freq: Every day | ORAL | Status: DC
  Administered 2018-05-25 – 2018-06-02 (×8): 40 mg via ORAL
  Filled 2018-05-24 (×8): qty 1

## 2018-05-24 MED ORDER — ASPIRIN EC 81 MG PO TBEC
81.0000 mg | DELAYED_RELEASE_TABLET | Freq: Every day | ORAL | Status: DC
  Administered 2018-05-25 – 2018-06-02 (×8): 81 mg via ORAL
  Filled 2018-05-24 (×8): qty 1

## 2018-05-24 MED ORDER — VANCOMYCIN HCL IN DEXTROSE 750-5 MG/150ML-% IV SOLN: 750.0000 mg | Freq: Two times a day (BID) | INTRAVENOUS | Status: DC

## 2018-05-24 MED ORDER — PAROXETINE HCL 30 MG PO TABS
60.0000 mg | ORAL_TABLET | Freq: Every day | ORAL | Status: DC
  Administered 2018-05-25: 60 mg via ORAL
  Filled 2018-05-24: qty 2

## 2018-05-24 MED ORDER — ACETAMINOPHEN 325 MG PO TABS: 650.0000 mg | ORAL_TABLET | Freq: Four times a day (QID) | ORAL | Status: DC | PRN

## 2018-05-24 MED ORDER — SODIUM CHLORIDE 0.9 % IV SOLN: 2.0000 g | Freq: Two times a day (BID) | INTRAVENOUS | Status: DC

## 2018-05-24 MED ORDER — PREDNISONE 5 MG PO TABS
5.0000 mg | ORAL_TABLET | Freq: Every day | ORAL | Status: DC
  Administered 2018-05-25 – 2018-06-02 (×8): 5 mg via ORAL
  Filled 2018-05-24 (×8): qty 1

## 2018-05-24 NOTE — ED Notes (Signed)
Family at bedside. 

## 2018-05-24 NOTE — Progress Notes (Signed)
Patient admit to room 35 with AMS. Patient is lethargic, family at bedside. Patient awaked at one point and answers some questions and went back to sleep. Patient in bed al low position, bed alarm on, bedside table, call light and telephone at patient's reach. Will continue to monitor patient.

## 2018-05-24 NOTE — ED Notes (Signed)
Patient transported to CT with primary RN. 

## 2018-05-24 NOTE — ED Triage Notes (Signed)
Per [facility] pt coming from [facility] assisted [facility] where she lives alone. EMS states patients friend took her to [facility] yesterday saying the patient seemed confused and altered. Friend checked on patient today and called EMS when could not get patient to door. EMS states pt has been only responsive to pain. NPA placed PTA.

## 2018-05-24 NOTE — H&P (Signed)
History and Physical    [name] ZWC:[id number] DOB: 10/30/1945 DOA: 05/24/2018  PCP: Leighton Ruff, MD Consultants:  None Patient coming from: [facility] [place]; NOK: Sister, [phone]  Chief Complaint: AMS  HPI: [name] is a 72 y.o. female with medical history significant of hypothyroidism; HTN; HLD; and DM presenting with AMS. Her sister provided the history, as the patient is obtunded.  She reports that a friend was visiting the patient and that afterwards the friend reported to the nursing staff that she was "not acting quite right."  The nursing staff went to check on the patient and found her unresponsive.  The facility called the sister about 11AM to report that the patient was being transported to [facility].  Since the sister and her husband's arrival, the patient has been sleeping and unresponsive; she "doesn't usually sleep well" so this is unusual for her.  The sister last spoke to her about a week ago.  The facility did report that she apparently didn't feel well yesterday.  She moved into the facility about 3 weeks ago and had significant anxiety leading up to the move.  However, she has seemed to be happy there recently.  Her family does not believe that she would have intentionally taken her medications wrong, but this could have been an inadvertent medication misadventure.  The patient was started on a new medication for anxiety about 3-4 weeks ago, around the time she moved into the facility.   ED Course:   AMS - looks dry, no apparent source.  ?medication misadventure vs. Intentional self-harm.  Sister is present, hasn't seen her for 4 days.  Treated for possible infection due to RA, immunosuppression.  Awakens to stimuli with mumbling speech.  Review of Systems: Unable to perform  Past Medical History:  Diagnosis Date  . Anxiety   . Arthritis    rhematoid,osteoarthritis  . Diabetes mellitus without complication ([place])   . Elevated  cholesterol   . Hemorrhoids   . Hypertension   . Hypothyroidism   . Joint pain   . Nocturia   . Sleeping difficulties     Past Surgical History:  Procedure Laterality Date  . ABDOMINAL HYSTERECTOMY    . BACK SURGERY    . CERVICAL FUSION  2004  . CHOLECYSTECTOMY    . EYE SURGERY Left    cataract  . PARS PLANA REPAIR OF RETINAL DEATACHMENT    . THYROIDECTOMY, PARTIAL      Social History   Socioeconomic History  . Marital status: Widowed    Spouse name: Not on file  . Number of children: 0  . Years of education: HS  . Highest education level: Not on file  Occupational History  . Not on file  Social Needs  . Financial resource strain: Not on file  . Food insecurity:    Worry: Not on file    Inability: Not on file  . Transportation needs:    Medical: Not on file    Non-medical: Not on file  Tobacco Use  . Smoking status: Never Smoker  . Smokeless tobacco: Never Used  Substance and Sexual Activity  . Alcohol use: No  . Drug use: No  . Sexual activity: Not on file  Lifestyle  . Physical activity:    Days per week: Not on file    Minutes per session: Not on file  . Stress: Not on file  Relationships  . Social connections:    Talks on phone: Not on  file    Gets together: Not on file    Attends religious service: Not on file    Active member of club or organization: Not on file    Attends meetings of clubs or organizations: Not on file    Relationship status: Not on file  . Intimate partner violence:    Fear of current or ex partner: Not on file    Emotionally abused: Not on file    Physically abused: Not on file    Forced sexual activity: Not on file  Other Topics Concern  . Not on file  Social History Narrative   Drinks 3 caffeine drinks a day     Allergies  Allergen Reactions  . Contrast Media [Iodinated Diagnostic Agents] Anaphylaxis and Hives    Only the retina dye;   . Abilify [Aripiprazole]   . Ultram [Tramadol Hcl]   . Zithromax [Azithromycin]      Family History  Problem Relation Age of Onset  . Diabetes Mother   . Breast cancer Mother   . Gout Mother   . Alzheimer's disease Father   . Heart Problems Father   . Breast cancer Sister   . Alzheimer's disease Sister   . Diabetes Sister   . Gout Sister   . Heart Problems Sister   . Alzheimer's disease Brother   . Diabetes Brother   . Heart Problems Brother     Prior to Admission medications   Medication Sig Start Date End Date Taking? Authorizing Provider  ACCU-CHEK FASTCLIX LANCETS MISC CHECK BLOOD SUGAR ONCE A DAY 10/31/17   [provider]  ACCU-CHEK GUIDE test strip CHECK BLOOD SUGAR ONCE A DAY 10/29/17   [provider]  albuterol (PROVENTIL HFA;VENTOLIN HFA) 108 (90 Base) MCG/ACT inhaler Inhale 1-2 puffs into the lungs every 6 (six) hours as needed for wheezing or shortness of breath.    [provider]  ALPRAZolam Duanne Moron) 0.5 MG tablet Take 1 tablet (0.5 mg total) by mouth 3 (three) times daily. 05/17/18   Shugart, [name], PA-C  ARIPiprazole (ABILIFY) 10 MG tablet Take 10 mg by mouth daily. 10/06/17   [provider]  aspirin EC 81 MG tablet Take 81 mg by mouth daily.    [provider]  atorvastatin (LIPITOR) 40 MG tablet Take 40 mg by mouth daily.    [provider]  cephALEXin (KEFLEX) 500 MG capsule Take 1 capsule (500 mg total) by mouth 3 (three) times daily. Patient not taking: Reported on 04/22/2018 07/12/17   Virgel Manifold, MD  cetirizine (ZYRTEC) 10 MG tablet Take 10 mg by mouth daily as needed for allergies.    [provider]  Cholecalciferol (VITAMIN D) 2000 UNITS tablet Take 2,000 Units by mouth daily.    [provider]  cycloSPORINE (RESTASIS) 0.05 % ophthalmic emulsion Place 1 drop into both eyes 2 (two) times daily.    [provider]  Ergocalciferol (VITAMIN D2 PO) Vitamin D2    [provider]  ferrous sulfate 325 (65 FE) MG EC tablet Take 325 mg by mouth 3 (three)  times daily with meals.    [provider]  fluticasone (FLONASE) 50 MCG/ACT nasal spray Place 1-2 sprays into both nostrils daily.    [provider]  folic acid (FOLVITE) 1 MG tablet Take 2-3 mg by mouth daily. If feeling tired take 3 tablets daily    [provider]  HUMIRA PEDIATRIC CROHNS START 40 MG/0.8ML PSKT Inject 1 each into the skin every 14 (  fourteen) days.  04/09/16   [provider]  hydroxychloroquine (PLAQUENIL) 200 MG tablet Take 400 mg by mouth daily.    [provider]  IRON-FOLIC ACID-VIT A07 PO iron 65 MA-26 mg-folic acid 3,335 mcg (45)-GYBW with C#12-succ. tablet    [provider]  leflunomide (ARAVA) 20 MG tablet Take 20 mg by mouth daily.    [provider]  lisinopril (PRINIVIL,ZESTRIL) 10 MG tablet Take 10 mg by mouth daily. 10/20/17   [provider]  methotrexate (RHEUMATREX) 2.5 MG tablet 15 mg once a week. Wednesday 04/19/16   [provider]  metoprolol succinate (TOPROL-XL) 25 MG 24 hr tablet Take 25 mg by mouth daily.    [provider]  metroNIDAZOLE (METROGEL) 0.75 % gel APPLY TO AFFECTED AREA EVERY DAY 09/17/17   [provider]  PARoxetine (PAXIL) 40 MG tablet Take 1.5 tablets (60 mg total) by mouth daily. 05/17/18   Shugart, [name], PA-C  predniSONE (DELTASONE) 5 MG tablet Take 5 mg by mouth daily with breakfast.    [provider]    Physical Exam: Vitals:   05/24/18 1109 05/24/18 1119 05/24/18 1200  BP:  (!) 118/57   Pulse: (!) 102    Resp: 17    Temp: 99.1 F (37.3 C)    TempSrc: Rectal    Weight:   70.3 kg  Height:   5\' 4"  (1.626 m)     General: Obtunded/unresponsive other than mild grimaces to painful stimuli Eyes:  PERRL, normal lids, iris ENT:  grossly normal hearing, lips & tongue, mildly dry mm Neck:  no LAD, masses or thyromegaly; no carotid bruits Cardiovascular:  RRR, no m/r/g. No LE edema.  Respiratory:   CTA bilaterally with no  wheezes/rales/rhonchi.  Normal respiratory effort. Abdomen:  soft, NT, ND, NABS Skin:  no rash or induration seen on limited exam Musculoskeletal:  grossly normal tone BUE/BLE, no bony abnormality Psychiatric: obtunded, unresponsive except to deep painful stimuli Neurologic: unable to perform    Radiological Exams on Admission: Ct Head Wo Contrast  Result Date: 05/24/2018 CLINICAL DATA:  72 year old female found down at home. Unresponsive. Initial encounter. EXAM: CT HEAD WITHOUT CONTRAST TECHNIQUE: Contiguous axial images were obtained from the base of the skull through the vertex without intravenous contrast. COMPARISON:  01/31/2008 head CT. FINDINGS: Brain: Significant streak artifact through posterior fossa. Taking this limitation into account, no intracranial hemorrhage or CT evidence of large acute infarct noted. Mild global atrophy most notable parietal lobes. No intracranial mass lesion detected on this unenhanced exam. Vascular: No acute hyperdense vessel. Atherosclerotic changes cavernous segment carotid arteries. Skull: No skull fracture. Sinuses/Orbits: No acute orbital abnormality. Visualized paranasal sinuses, mastoid air cells and middle ear cavities are clear. Other: Nasal trumpet is in place. IMPRESSION: 1. Significant streak artifact through posterior fossa. Taking this limitation into account, no intracranial hemorrhage or CT evidence of large acute infarct noted. 2. Mild global atrophy most notable parietal lobes. Electronically Signed   By: Genia Del M.D.   On: 05/24/2018 11:45   Dg Chest Port 1 View  Result Date: 05/24/2018 CLINICAL DATA:  Altered mental status EXAM: PORTABLE CHEST 1 VIEW COMPARISON:  12/11/2016 FINDINGS: Low lung volumes with bibasilar atelectasis. Heart is upper limits normal in size. No effusions or acute bony abnormality. IMPRESSION: Low lung volumes, bibasilar atelectasis. Electronically Signed   By: Rolm Baptise M.D.   On: 05/24/2018 11:52    EKG:  Independently reviewed.  Sinus tachycardia  with rate 101; IVCD; nonspecific  ST changes with no evidence of acute ischemia   Labs on Admission: I have personally reviewed the available labs and imaging studies at the time of the admission.  Pertinent labs:   VBG: 7.332/49.6/33 CMP unremarkable CBC unremarkable ASA, APAP negative UA: 20 ketones, 30 protein  Assessment/Plan Principal Problem:   Altered mental status Active Problems:   Type 2 diabetes mellitus without complication (HCC)   Rheumatoid arthritis (HCC)   Hypertension   Hyperlipidemia   Depression   Ketonuria   AMS -Patient presenting with encephalopathy as evidenced by her obtunded/unresponsiveness -Uncertain when was her last normal, since family hasn't spoken to her in about a week and she lives in independent living -Evaluation thus far unremarkable -While initial concern was for infection in the setting of chronic immunosuppression and so she was given broad-spectrum antibiotics empirically, there is no other evidence of infection at this time -Suspect medication misadventure, likely associated with Xanax and/or Abilify -She does appear to be mildly dehydrated, as evidenced by her ketonuria, but she does not have renal failure at this time -Based on unremarkable evaluation with current ability to protect her airway, will observe for now on telemetry with IVF hydration and telemetry monitoring  Depression -She was last seen at [facility] on 11/4 -Prior to the last clinic visit, on 10/10, she was having worsening depression with "suicidal thoughts and plans for the first time ever."   -Her Paxil was increased and she was continued on Xanax -On 11/4, she reported initial improvement for 3 weeks but then the 4 days prior she had worsening depression and anxiety - but no SI -Medication changes were not made at that time -Given this history, there is clear concern that she had an intentional or unintentional overdose -  most likely associated with Xanax and/or Abilify (although it is not clear that she is still taking this medication) -Will try to do a washout period -Will use telesitter -Consider BH consult once alert -Resume Prozac in the AM (if alert) but hold other PO medications for psych reasons  HTN -Resume Lisinopril, Toprol XL in the AM  HLD -Continue Lipitor  DM -Marginal hyperglycemia -She does not appear to be taking medications for this at this time -Will monitor with fasting labs  RA -She is on a large number of DMARD medications - causing her to be chronically immunosuppressed -Resume these medications in the AM - Prednisone, Plaquenil, Arava -Also on weekly methotrexate and biweekly Humira -While these immunosuppressants increase her risk for infection, she does not show obvious s/sx infection at this time and so antibiotics will be held  DVT prophylaxis:  Lovenox Code Status:  DNR - confirmed with family Family Communication: Sister and brother-in-law were present throughout evaluation Disposition Plan:  To be determined Consults called: None Admission status: It is my clinical opinion that referral for OBSERVATION is reasonable and necessary in this patient based on the above information provided. The aforementioned taken together are felt to place the patient at high risk for further clinical deterioration. However it is anticipated that the patient may be medically stable for discharge from the hospital within 24 to 48 hours.    Karmen Bongo MD [facility]  If note is complete, please contact covering daytime or nighttime physician. [email or web address] Password [facility]  05/24/2018, 2:38 PM

## 2018-05-24 NOTE — Progress Notes (Signed)
Pharmacy Antibiotic Note  [name] is a 72 y.o. female admitted on 05/24/2018 unresponsive - covering for possible unknown infection  Plan: Cefepime 2 g q12h Vanc 1 g x 1 then 750 mg q12h Flagyl 500 mg q8 Monitor renal fx cx vt prn  Height: 5\' 4"  (162.6 cm) Weight: 155 lb (70.3 kg) IBW/kg (Calculated) : 54.7  Temp (24hrs), Avg:99.1 F (37.3 C), Min:99.1 F (37.3 C), Max:99.1 F (37.3 C)  Recent Labs  Lab 05/24/18 1111 05/24/18 1134 05/24/18 1135  WBC 4.2  --   --   CREATININE 0.68 0.60  --   LATICACIDVEN  --   --  1.17    Estimated Creatinine Clearance: 61.1 mL/min (by C-G formula based on SCr of 0.6 mg/dL).    Allergies  Allergen Reactions  . Contrast Media [Iodinated Diagnostic Agents] Anaphylaxis and Hives    Only the retina dye;   . Abilify [Aripiprazole]   . Ultram [Tramadol Hcl]   . Zithromax [Azithromycin]    Levester Fresh, PharmD, BCPS, BCCCP Clinical Pharmacist [phone]  Please check AMION for all [facility] numbers  05/24/2018 1:03 PM

## 2018-05-24 NOTE — ED Provider Notes (Signed)
[place] EMERGENCY DEPARTMENT Provider Note   CSN: [id number] Arrival date & time: 05/24/18  1106     History   Chief Complaint No chief complaint on file.   HPI [name] is a 72 y.o. female.  The history is provided by the EMS personnel and medical records. No language interpreter was used.   [name] is a 72 y.o. female who presents to the Emergency Department complaining of AMS. She presents to the emergency department complaining of altered mental status. Level V caveat due to altered mental status. History is provided by EMS. Per EMS a friend came to visit her yesterday and they ate at [facility] and she appeared confused and drowsy at that time. Today when someone came to check on her she did not come to the door. She is been minimally responsive since that time. Past Medical History:  Diagnosis Date  . Anxiety   . Arthritis    rhematoid,osteoarthritis  . Diabetes mellitus without complication ([place])   . Elevated cholesterol   . Hemorrhoids   . Hypertension   . Hypothyroidism   . Joint pain   . Nocturia   . Sleeping difficulties     Patient Active Problem List   Diagnosis Date Noted  . Altered mental status 05/24/2018  . Ketonuria 05/24/2018  . Pain in right hand 09/11/2017  . Hypertension 03/07/2014  . Hyperlipidemia 03/07/2014  . Depression 03/07/2014  . 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 Type 2 diabetes mellitus without complication ([place]) 38/75/6433  . Rheumatoid arthritis ([place]) 02/24/2014  . Scoliosis of lumbar spine 02/13/2014    Past Surgical History:  Procedure Laterality Date  . ABDOMINAL HYSTERECTOMY    . BACK SURGERY    . CERVICAL FUSION  2004  . CHOLECYSTECTOMY    . EYE SURGERY Left    cataract  . PARS PLANA REPAIR OF RETINAL DEATACHMENT    . THYROIDECTOMY, PARTIAL       OB History   None      Home Medications    Prior to Admission medications     Medication Sig Start Date End Date Taking? Authorizing Provider  ALPRAZolam Duanne Moron) 0.5 MG tablet Take 1 tablet (0.5 mg total) by mouth 3 (three) times daily. 05/17/18  Yes Shugart, [name], PA-C  aspirin EC 81 MG tablet Take 81 mg by mouth daily.   Yes [provider]  atorvastatin (LIPITOR) 40 MG tablet Take 40 mg by mouth daily.   Yes [provider]  Cholecalciferol (VITAMIN D) 2000 UNITS tablet Take 2,000 Units by mouth daily.   Yes [provider]  Ergocalciferol (VITAMIN D2 PO) Take 2,000 Units by mouth daily.    Yes [provider]  ferrous sulfate 325 (65 FE) MG EC tablet Take 325 mg by mouth 3 (three) times daily with meals.   Yes [provider]  folic acid (FOLVITE) 1 MG tablet Take 1 mg by mouth daily.    Yes [provider]  hydroxychloroquine (PLAQUENIL) 200 MG tablet Take 400 mg by mouth daily.   Yes [provider]  leflunomide (ARAVA) 20 MG tablet Take 20 mg by mouth daily.   Yes [provider]  lisinopril (PRINIVIL,ZESTRIL) 10 MG tablet Take 10 mg by mouth daily. 10/20/17  Yes [provider]  methotrexate (RHEUMATREX) 2.5 MG tablet 15 mg once a week. Wednesday 04/19/16  Yes [provider]  metoprolol succinate (TOPROL-XL) 25 MG 24 hr tablet Take 25  mg by mouth daily.   Yes [provider]  PARoxetine (PAXIL) 40 MG tablet Take 1.5 tablets (60 mg total) by mouth daily. 05/17/18  Yes Shugart, [name], PA-C  predniSONE (DELTASONE) 5 MG tablet Take 5 mg by mouth daily with breakfast.   Yes [provider]  vitamin B-12 (CYANOCOBALAMIN) 1000 MCG tablet Take 1,000 mcg by mouth daily.   Yes [provider]  ACCU-CHEK FASTCLIX LANCETS MISC CHECK BLOOD SUGAR ONCE A DAY 10/31/17   [provider]  ACCU-CHEK GUIDE test strip CHECK BLOOD SUGAR ONCE A DAY 10/29/17   [provider]  albuterol (PROVENTIL HFA;VENTOLIN HFA) 108 (90 Base) MCG/ACT inhaler Inhale 1-2 puffs  into the lungs every 6 (six) hours as needed for wheezing or shortness of breath.    [provider]  ARIPiprazole (ABILIFY) 10 MG tablet Take 10 mg by mouth daily. 10/06/17   [provider]  cetirizine (ZYRTEC) 10 MG tablet Take 10 mg by mouth daily as needed for allergies.    [provider]  cycloSPORINE (RESTASIS) 0.05 % ophthalmic emulsion Place 1 drop into both eyes 2 (two) times daily.    [provider]  fluticasone (FLONASE) 50 MCG/ACT nasal spray Place 1-2 sprays into both nostrils daily.    [provider]  HUMIRA PEDIATRIC CROHNS START 40 MG/0.8ML PSKT Inject 1 each into the skin every 14 (fourteen) days.  04/09/16   [provider]    Family History Family History  Problem Relation Age of Onset  . Diabetes Mother   . Breast cancer Mother   . Gout Mother   . Alzheimer's disease Father   . Heart Problems Father   . Breast cancer Sister   . Alzheimer's disease Sister   . Diabetes Sister   . Gout Sister   . Heart Problems Sister   . Alzheimer's disease Brother   . Diabetes Brother   . Heart Problems Brother     Social History Social History   Tobacco Use  . Smoking status: Never Smoker  . Smokeless tobacco: Never Used  Substance Use Topics  . Alcohol use: No  . Drug use: No     Allergies   Contrast media [iodinated diagnostic agents]; Abilify [aripiprazole]; Ultram [tramadol hcl]; and Zithromax [azithromycin]   Review of Systems Review of Systems  Unable to perform ROS: Mental status change     Physical Exam Updated Vital Signs BP 140/74 (BP Location: Left Arm)   Pulse 95   Temp 99.1 F (37.3 C) (Rectal)   Resp 18   Ht 5\' 4"  (1.626 m)   Wt 70.3 kg   SpO2 100%   BMI 26.61 kg/m   Physical Exam  Constitutional: She appears well-developed and well-nourished.  HENT:  Head: Normocephalic and atraumatic.  Dry mucous membranes  Eyes:  Pupils pinpoint and reactive bilaterally  Cardiovascular:  Regular rhythm.  No murmur heard. Tachycardic  Pulmonary/Chest: Effort normal and breath sounds normal. No respiratory distress.  Abdominal: Soft. There is no tenderness. There is no rebound and no guarding.  Musculoskeletal: She exhibits no edema or tenderness.  Neurological:  Lethargic, arousable to deep painful stimuli in all four extremities. Mumbling speech.  Skin: Skin is warm and dry.  Psychiatric:  Unable to assess  Nursing note and vitals reviewed.    ED Treatments / Results  Labs (all labs ordered are listed, but only abnormal results are displayed) Labs Reviewed  COMPREHENSIVE METABOLIC PANEL - Abnormal; Notable for the following components:  Result Value   Potassium 3.4 (*)    Glucose, Bld 103 (*)    Calcium 8.5 (*)    Total Protein 6.1 (*)    All other components within normal limits  ACETAMINOPHEN LEVEL - Abnormal; Notable for the following components:   Acetaminophen (Tylenol), Serum <10 (*)    All other components within normal limits  CBC WITH DIFFERENTIAL/PLATELET - Abnormal; Notable for the following components:   RBC 3.72 (*)    MCV 107.0 (*)    Platelets 138 (*)    Lymphs Abs 0.6 (*)    All other components within normal limits  URINALYSIS, ROUTINE W REFLEX MICROSCOPIC - Abnormal; Notable for the following components:   Ketones, ur 20 (*)    Protein, ur 30 (*)    Bacteria, UA RARE (*)    All other components within normal limits  I-STAT CHEM 8, ED - Abnormal; Notable for the following components:   Potassium 3.4 (*)    Calcium, Ion 0.95 (*)    All other components within normal limits  CBG MONITORING, ED - Abnormal; Notable for the following components:   Glucose-Capillary 100 (*)    All other components within normal limits  URINE CULTURE  ETHANOL  SALICYLATE LEVEL  AMMONIA  I-STAT VENOUS BLOOD GAS, ED  I-STAT TROPONIN, ED  I-STAT CG4 LACTIC ACID, ED  I-STAT CG4 LACTIC ACID, ED    EKG EKG Interpretation  Date/Time:  Monday May 24 2018 11:08:44 EST Ventricular Rate:  101 PR Interval:    QRS Duration: 124 QT Interval:  377 QTC Calculation: 489 R Axis:   30 Text Interpretation:  Sinus tachycardia Nonspecific intraventricular conduction delay Minimal ST depression, inferior leads Confirmed by Quintella Reichert [phone]) on 05/24/2018 11:43:53 AM Also confirmed by Quintella Reichert [phone]), editor Lynder Parents [phone])  on 05/24/2018 2:10:34 PM   Radiology Ct Head Wo Contrast  Result Date: 05/24/2018 CLINICAL DATA:  72 year old female found down at home. Unresponsive. Initial encounter. EXAM: CT HEAD WITHOUT CONTRAST TECHNIQUE: Contiguous axial images were obtained from the base of the skull through the vertex without intravenous contrast. COMPARISON:  01/31/2008 head CT. FINDINGS: Brain: Significant streak artifact through posterior fossa. Taking this limitation into account, no intracranial hemorrhage or CT evidence of large acute infarct noted. Mild global atrophy most notable parietal lobes. No intracranial mass lesion detected on this unenhanced exam. Vascular: No acute hyperdense vessel. Atherosclerotic changes cavernous segment carotid arteries. Skull: No skull fracture. Sinuses/Orbits: No acute orbital abnormality. Visualized paranasal sinuses, mastoid air cells and middle ear cavities are clear. Other: Nasal trumpet is in place. IMPRESSION: 1. Significant streak artifact through posterior fossa. Taking this limitation into account, no intracranial hemorrhage or CT evidence of large acute infarct noted. 2. Mild global atrophy most notable parietal lobes. Electronically Signed   By: Genia Del M.D.   On: 05/24/2018 11:45   Dg Chest Port 1 View  Result Date: 05/24/2018 CLINICAL DATA:  Altered mental status EXAM: PORTABLE CHEST 1 VIEW COMPARISON:  12/11/2016 FINDINGS: Low lung volumes with bibasilar atelectasis. Heart is upper limits normal in size. No effusions or acute bony abnormality. IMPRESSION: Low lung volumes,  bibasilar atelectasis. Electronically Signed   By: Rolm Baptise M.D.   On: 05/24/2018 11:52    Procedures Procedures (including critical care time) CRITICAL CARE Performed by: Quintella Reichert   Total critical care time: 35 minutes  Critical care time was exclusive of separately billable procedures and treating other patients.  Critical care was necessary to  treat or prevent imminent or life-threatening deterioration.  Critical care was time spent personally by me on the following activities: development of treatment plan with patient and/or surrogate as well as nursing, discussions with consultants, evaluation of patient's response to treatment, examination of patient, obtaining history from patient or surrogate, ordering and performing treatments and interventions, ordering and review of laboratory studies, ordering and review of radiographic studies, pulse oximetry and re-evaluation of patient's condition.  Medications Ordered in ED Medications  aspirin EC tablet 81 mg (has no administration in time range)  leflunomide (ARAVA) tablet 20 mg (has no administration in time range)  hydroxychloroquine (PLAQUENIL) tablet 400 mg (has no administration in time range)  atorvastatin (LIPITOR) tablet 40 mg (has no administration in time range)  metoprolol succinate (TOPROL-XL) 24 hr tablet 25 mg (has no administration in time range)  lisinopril (PRINIVIL,ZESTRIL) tablet 10 mg (has no administration in time range)  PARoxetine (PAXIL) tablet 60 mg (has no administration in time range)  predniSONE (DELTASONE) tablet 5 mg (has no administration in time range)  fluticasone (FLONASE) 50 MCG/ACT nasal spray 1-2 spray (has no administration in time range)  cycloSPORINE (RESTASIS) 0.05 % ophthalmic emulsion 1 drop (has no administration in time range)  enoxaparin (LOVENOX) injection 40 mg (has no administration in time range)  acetaminophen (TYLENOL) tablet 650 mg (has no administration in time range)     Or  acetaminophen (TYLENOL) suppository 650 mg (has no administration in time range)  ondansetron (ZOFRAN) tablet 4 mg (has no administration in time range)    Or  ondansetron (ZOFRAN) injection 4 mg (has no administration in time range)  lactated ringers infusion ( Intravenous New Bag/Given 05/24/18 1622)  sodium chloride 0.9 % bolus 1,000 mL (0 mLs Intravenous Stopped 05/24/18 1334)  hydrocortisone sodium succinate (SOLU-CORTEF) 100 MG injection 100 mg (100 mg Intravenous Given 05/24/18 1154)  naloxone (NARCAN) injection (0.4 mg Intravenous Given 05/24/18 1110)  ceFEPIme (MAXIPIME) 2 g in sodium chloride 0.9 % 100 mL IVPB (0 g Intravenous Stopped 05/24/18 1221)  vancomycin (VANCOCIN) IVPB 1000 mg/200 mL premix (0 mg Intravenous Stopped 05/24/18 1531)     Initial Impression / Assessment and Plan / ED Course  I have reviewed the triage vital signs and the nursing notes.  Pertinent labs & imaging results that were available during my care of the patient were reviewed by me and considered in my medical decision making (see chart for details).     Patient brought into the emergency department for altered mental status and unresponsiveness. She is lethargic with dry mucous membranes, only arousable to painful stimuli in the department. She was treated with broad-spectrum antibiotics given her relative immunocompromised status on her chronic steroids. She was treated with stress dose steroids as well. She was given Narcan with no change in her mental status. On record review she has recently seen behavioral health for depression, question if there was a possible overdose. Family available for discussion after initial patient evaluation. Family has last seen her four days ago and states that she is in good spirits and does not have any ongoing SI or history of self harm. He denies any recent illness. Medicine consulted for admission for further evaluation and treatment.  Final Clinical  Impressions(s) / ED Diagnoses   Final diagnoses:  Altered mental status, unspecified altered mental status type    ED Discharge Orders    None       Quintella Reichert, MD 05/24/18 [phone]

## 2018-05-25 ENCOUNTER — Observation Stay (HOSPITAL_COMMUNITY): Payer: Medicare HMO

## 2018-05-25 DIAGNOSIS — M069 Rheumatoid arthritis, unspecified: Secondary | ICD-10-CM | POA: Diagnosis not present

## 2018-05-25 DIAGNOSIS — R40241 Glasgow coma scale score 13-15, unspecified time: Secondary | ICD-10-CM | POA: Diagnosis not present

## 2018-05-25 DIAGNOSIS — E119 Type 2 diabetes mellitus without complications: Secondary | ICD-10-CM | POA: Diagnosis not present

## 2018-05-25 DIAGNOSIS — R4182 Altered mental status, unspecified: Secondary | ICD-10-CM | POA: Diagnosis not present

## 2018-05-25 DIAGNOSIS — I10 Essential (primary) hypertension: Secondary | ICD-10-CM | POA: Diagnosis not present

## 2018-05-25 DIAGNOSIS — E876 Hypokalemia: Secondary | ICD-10-CM | POA: Diagnosis not present

## 2018-05-25 DIAGNOSIS — F329 Major depressive disorder, single episode, unspecified: Secondary | ICD-10-CM | POA: Diagnosis not present

## 2018-05-25 DIAGNOSIS — F333 Major depressive disorder, recurrent, severe with psychotic symptoms: Secondary | ICD-10-CM | POA: Diagnosis not present

## 2018-05-25 DIAGNOSIS — E785 Hyperlipidemia, unspecified: Secondary | ICD-10-CM | POA: Diagnosis not present

## 2018-05-25 LAB — BASIC METABOLIC PANEL
Anion gap: 6 (ref 5–15)
BUN: 13 mg/dL (ref 8–23)
CHLORIDE: 111 mmol/L (ref 98–111)
CO2: 24 mmol/L (ref 22–32)
CREATININE: 0.63 mg/dL (ref 0.44–1.00)
Calcium: 8.1 mg/dL — ABNORMAL LOW (ref 8.9–10.3)
GFR calc non Af Amer: >60 mL/min (ref >60)
Glucose, Bld: 71 mg/dL (ref 70–99)
Potassium: 3.3 mmol/L — ABNORMAL LOW (ref 3.5–5.1)
SODIUM: 141 mmol/L (ref 135–145)

## 2018-05-25 LAB — URINE CULTURE: CULTURE: NO GROWTH

## 2018-05-25 LAB — CBC
HCT: 35.1 % — ABNORMAL LOW (ref 36.0–46.0)
Hemoglobin: 10.7 g/dL — ABNORMAL LOW (ref 12.0–15.0)
MCH: 31.8 pg (ref 26.0–34.0)
MCHC: 30.5 g/dL (ref 30.0–36.0)
MCV: 104.5 fL — ABNORMAL HIGH (ref 80.0–100.0)
NRBC: 0 % (ref 0.0–0.2)
PLATELETS: 107 10*3/uL — AB (ref 150–400)
RBC: 3.36 MIL/uL — ABNORMAL LOW (ref 3.87–5.11)
RDW: 13.9 % (ref 11.5–15.5)
WBC: 4.6 10*3/uL (ref 4.0–10.5)

## 2018-05-25 LAB — T4, FREE: Free T4: 1.05 ng/dL (ref 0.82–1.77)

## 2018-05-25 LAB — HEMOGLOBIN A1C
HEMOGLOBIN A1C: 5.3 % (ref 4.8–5.6)
MEAN PLASMA GLUCOSE: 105.41 mg/dL

## 2018-05-25 LAB — TSH: TSH: 0.571 u[IU]/mL (ref 0.350–4.500)

## 2018-05-25 LAB — VITAMIN B12: Vitamin B-12: 935 pg/mL — ABNORMAL HIGH (ref 180–914)

## 2018-05-25 MED ORDER — ENSURE ENLIVE PO LIQD
237.0000 mL | Freq: Two times a day (BID) | ORAL | Status: DC
  Administered 2018-05-25 – 2018-05-31 (×8): 237 mL via ORAL

## 2018-05-25 MED ORDER — METOPROLOL TARTRATE 50 MG PO TABS
50.0000 mg | ORAL_TABLET | Freq: Two times a day (BID) | ORAL | Status: DC
  Administered 2018-05-26 – 2018-05-28 (×5): 50 mg via ORAL
  Filled 2018-05-25 (×6): qty 1

## 2018-05-25 MED ORDER — VITAMIN D 50 MCG (2000 UT) PO TABS: 2000.0000 [IU] | ORAL_TABLET | Freq: Every day | ORAL | Status: DC

## 2018-05-25 MED ORDER — ACETAMINOPHEN 325 MG PO TABS: 650.0000 mg | ORAL_TABLET | Freq: Four times a day (QID) | ORAL | Status: DC | PRN

## 2018-05-25 MED ORDER — FOLIC ACID 1 MG PO TABS
1.0000 mg | ORAL_TABLET | Freq: Every day | ORAL | Status: DC
  Administered 2018-05-25 – 2018-06-02 (×8): 1 mg via ORAL
  Filled 2018-05-25 (×8): qty 1

## 2018-05-25 MED ORDER — VITAMIN D 25 MCG (1000 UNIT) PO TABS
2000.0000 [IU] | ORAL_TABLET | Freq: Every day | ORAL | Status: DC
  Administered 2018-05-25 – 2018-06-02 (×8): 2000 [IU] via ORAL
  Filled 2018-05-25 (×6): qty 2

## 2018-05-25 MED ORDER — VITAMIN B-12 1000 MCG PO TABS
1000.0000 ug | ORAL_TABLET | Freq: Every day | ORAL | Status: DC
  Administered 2018-05-25 – 2018-06-02 (×8): 1000 ug via ORAL
  Filled 2018-05-25 (×8): qty 1

## 2018-05-25 MED ORDER — ADULT MULTIVITAMIN W/MINERALS CH
1.0000 | ORAL_TABLET | Freq: Every day | ORAL | Status: DC
  Administered 2018-05-25 – 2018-06-02 (×8): 1 via ORAL
  Filled 2018-05-25 (×8): qty 1

## 2018-05-25 MED ORDER — METOPROLOL TARTRATE 50 MG PO TABS
50.0000 mg | ORAL_TABLET | Freq: Two times a day (BID) | ORAL | Status: DC
  Filled 2018-05-25: qty 1

## 2018-05-25 MED ORDER — ALPRAZOLAM 0.5 MG PO TABS
0.5000 mg | ORAL_TABLET | Freq: Every day | ORAL | Status: DC
  Administered 2018-05-25: 0.5 mg via ORAL
  Filled 2018-05-25: qty 1

## 2018-05-25 MED ORDER — POTASSIUM CHLORIDE 10 MEQ/100ML IV SOLN
10.0000 meq | INTRAVENOUS | Status: AC
  Administered 2018-05-25 (×4): 10 meq via INTRAVENOUS
  Filled 2018-05-25 (×4): qty 100

## 2018-05-25 MED ORDER — VITAMIN D2 50 MCG (2000 UT) PO TABS: 2000.0000 [IU] | ORAL_TABLET | Freq: Every day | ORAL | Status: DC

## 2018-05-25 MED ORDER — METOPROLOL TARTRATE 25 MG PO TABS
25.0000 mg | ORAL_TABLET | Freq: Once | ORAL | Status: AC
  Administered 2018-05-25: 25 mg via ORAL

## 2018-05-25 MED ORDER — LORAZEPAM 2 MG/ML IJ SOLN: 1.0000 mg | Freq: Once | INTRAMUSCULAR | Status: DC | PRN

## 2018-05-25 MED ORDER — LACTATED RINGERS IV SOLN
INTRAVENOUS | Status: DC
  Administered 2018-05-25 (×2): via INTRAVENOUS

## 2018-05-25 NOTE — Progress Notes (Signed)
Spoke to physical therapist regarding conversation overheard between patient and her sister. Per therapist patient said to her sister she intentionally took too much of her medications. Notified Dr. Candiss Norse and charge RN Ihor Dow. Suicide precautions enacted sitter at bedside. Will continue to monitor.

## 2018-05-25 NOTE — Progress Notes (Signed)
Initial Nutrition Assessment  DOCUMENTATION CODES:   Not applicable  INTERVENTION:  Once diet advanced:  -Ensure Enlive BID. Each supplement provides 350 kcal and 20 grams protein  -MVI daily  NUTRITION DIAGNOSIS:   Inadequate oral intake related to decreased appetite, social / environmental circumstances (depression) as evidenced by per patient/family report.  GOAL:   Patient will meet greater than or equal to 90% of their needs  MONITOR:   Diet advancement, PO intake, Supplement acceptance, Weight trends, Labs  REASON FOR ASSESSMENT:   Malnutrition Screening Tool    ASSESSMENT:  [name] is a 72 yo female with PMH of hypothyroidism, HTN, HLD, T2DM, who is admitted from independent living facility for evaluation of altered mental status (AMS).   Visited pt in room. She is sitting in recliner next to window.  Pt reports ongoing decreased appetite for 1 year along with wt loss of 35#. Pt unable to state UBW.  She has recently moved into retirement community and lives alone.  Pt says she doesn't cook and "hasn't in a long time".   Diet recall: Breakfast: oatmeal and coffee from [employer] Lunch: snacks and cookies ("junk") or chicken salad in packet from [employer] and crackers  Dinner: [employer] goes out with her "more than a friend" and eats at [employer], [employer], [employer], or otherwise eats more "junk" consisting of chips and cookies  Beverages: water, diet coke, coffee Supplements: Vitamin D   Pt says she "eats a lot of stuff she shouldn't" due to diabetes and that she used to take metformin but now doesn't. Explained the importance of compliance with medications as well as eating a diet higher in nutrient density.   Pt's demeanor is depressed and at times irritable. Pt says that she has felt depressed with lack of motivation for "a long time"; is currently receiving treatment but that it no longer helps her. Reviewing chart she recently had outpatient visit for depression.    Near end of visit pt becomes irritable saying she wants something to eat. Pt was previously NPO due to lethargy. After conclusion of visit MD advanced diet to heart healthy/carb modified.   Meds: prednisone Labs: CBGs 94-131, potassium 3.3  NUTRITION - FOCUSED PHYSICAL EXAM:    Most Recent Value  Orbital Region  No depletion  Upper Arm Region  No depletion  Thoracic and Lumbar Region  No depletion  Buccal Region  No depletion  Temple Region  No depletion  Clavicle Bone Region  No depletion  Clavicle and Acromion Bone Region  No depletion  Scapular Bone Region  No depletion  Dorsal Hand  No depletion  Patellar Region  No depletion  Anterior Thigh Region  No depletion  Posterior Calf Region  No depletion  Edema (RD Assessment)  None  Hair  Reviewed  Eyes  Reviewed  Mouth  Reviewed  Skin  Reviewed  Nails  Reviewed      Diet Order:   Diet Order            Diet heart healthy/carb modified Room service appropriate? Yes; Fluid consistency: Thin  Diet effective now              EDUCATION NEEDS:   Education needs have been addressed  Skin:  Skin Assessment: Reviewed RN Assessment  Last BM:  11/11  Height:   Ht Readings from Last 1 Encounters:  05/24/18 5\' 4"  (1.626 m)    Weight:   Wt Readings from Last 1 Encounters:  05/24/18 70.3 kg  Ideal Body Weight:  54.5 kg  BMI:  Body mass index is 26.61 kg/m.  Estimated Nutritional Needs:   Kcal:  1500-1700  Protein:  75-85 gm  Fluid:  >/=1.5 L    [name], Dietetic Intern [phone]

## 2018-05-25 NOTE — Progress Notes (Addendum)
Patient pulled out her nasopharyngeal airway out.  No bleeding noted.

## 2018-05-25 NOTE — Evaluation (Signed)
Physical Therapy Evaluation Patient Details Name: [name] MRN: [id number] DOB: Nov 03, 1945 Today's Date: 05/25/2018   History of Present Illness  [name] is a 72 y.o. female with medical history significant of hypothyroidism; HTN; HLD; and DM presenting with AMS.  Clinical Impression  Pt admitted with above diagnosis. Pt currently with functional limitations due to the deficits listed below (see PT Problem List). Upon entering room, pt was telling her sister that she took most of a bottle of Xanax because she was just so sad and unhappy. Pt with very flat affect and did not participate much in conversation but when she did speak comments were paranoid and suspicious and she seemed confused about where she was. Needed mod A to stand from chair. Ambulated 160' but fatigued significantly after 100' and needed min A as well as use of rail along wall to get back to room safely. Not safe to be home alone from a mobility standpoint or a cognitive/ emotional standpoint. If she goes to inpt behavioral health, recommend PT there, if not, recommend SNF for rehab.  Pt will benefit from skilled PT to increase their independence and safety with mobility to allow discharge to the venue listed below.       Follow Up Recommendations SNF;Supervision/Assistance - 24 hour    Equipment Recommendations  Rolling walker with 5" wheels    Recommendations for Other Services OT consult     Precautions / Restrictions Precautions Precautions: Fall;Other (comment)(suicide) Precaution Comments: upon PT arrival pt telling her sister that she took a bunch of her Xanax because she is just so unhappy Restrictions Weight Bearing Restrictions: No      Mobility  Bed Mobility               General bed mobility comments: pt received in recliner  Transfers Overall transfer level: Needs assistance Equipment used: 1 person hand held assist Transfers: Sit to/from Stand Sit to Stand: Mod assist         General transfer comment: mod A for power up due to LE weakness  Ambulation/Gait Ambulation/Gait assistance: Min assist Gait Distance (Feet): 160 Feet Assistive device: 1 person hand held assist Gait Pattern/deviations: Step-through pattern;Shuffle Gait velocity: decreased Gait velocity interpretation: <1.8 ft/sec, indicate of risk for recurrent falls General Gait Details: pt does usually ambulate with a RW so did not use one but after 100' pt was very faigued and was reaching for handrail on wall with more support given pt therapist on other side as well.   Stairs            Wheelchair Mobility    Modified Rankin (Stroke Patients Only)       Balance Overall balance assessment: Needs assistance Sitting-balance support: No upper extremity supported Sitting balance-Leahy Scale: Good     Standing balance support: Single extremity supported Standing balance-Leahy Scale: Fair Standing balance comment: poor when fatigued                             Pertinent Vitals/Pain Pain Assessment: No/denies pain    Home Living Family/patient expects to be discharged to:: Private residence Living Arrangements: Alone Available Help at Discharge: Family;Available PRN/intermittently Type of Home: Independent living facility Home Access: Level entry     Home Layout: One level Home Equipment: None Additional Comments: pt has recently moved to the [facility] [place]. She lives alone. Her niece and sister check on her regularly.  Prior Function Level of Independence: Independent         Comments: drives, doesn't cook much though.      Hand Dominance        Extremity/Trunk Assessment   Upper Extremity Assessment Upper Extremity Assessment: Generalized weakness    Lower Extremity Assessment Lower Extremity Assessment: Generalized weakness    Cervical / Trunk Assessment Cervical / Trunk Assessment: Kyphotic  Communication   Communication:  No difficulties  Cognition Arousal/Alertness: Awake/alert Behavior During Therapy: Flat affect Overall Cognitive Status: Impaired/Different from baseline Area of Impairment: Orientation;Memory;Safety/judgement;Problem solving                 Orientation Level: Situation;Time;Disoriented to   Memory: Decreased short-term memory   Safety/Judgement: Decreased awareness of safety;Decreased awareness of deficits   Problem Solving: Slow processing;Decreased initiation;Requires verbal cues;Requires tactile cues General Comments: pt very flat and demonstrates limited insight into deficits and current situation. Sister reports that this is not her baseline.       General Comments General comments (skin integrity, edema, etc.): pt very paranoid that someone is out to get her. Insisted that her sister remain with her at all times, ie in the bathroom, while walking in hall.     Exercises     Assessment/Plan    PT Assessment Patient needs continued PT services  PT Problem List Decreased strength;Decreased activity tolerance;Decreased balance;Decreased cognition;Decreased mobility       PT Treatment Interventions DME instruction;Gait training;Functional mobility training;Therapeutic activities;Therapeutic exercise;Balance training;Cognitive remediation;Patient/family education    PT Goals (Current goals can be found in the Care Plan section)  Acute Rehab PT Goals Patient Stated Goal: none stated PT Goal Formulation: With patient/family Time For Goal Achievement: 06/08/18 Potential to Achieve Goals: Fair    Frequency Min 2X/week   Barriers to discharge Decreased caregiver support lives alone, family not available consistently and several of them having health issues as well    Co-evaluation               AM-PAC PT "6 Clicks" Daily Activity  Outcome Measure Difficulty turning over in bed (including adjusting bedclothes, sheets and blankets)?: A Lot Difficulty moving from  lying on back to sitting on the side of the bed? : A Lot Difficulty sitting down on and standing up from a chair with arms (e.g., wheelchair, bedside commode, etc,.)?: Unable Help needed moving to and from a bed to chair (including a wheelchair)?: A Little Help needed walking in hospital room?: A Little Help needed climbing 3-5 steps with a railing? : A Lot 6 Click Score: 13    End of Session Equipment Utilized During Treatment: Gait belt Activity Tolerance: Patient limited by fatigue Patient left: in chair;with call bell/phone within reach;with chair alarm set;with family/visitor present Nurse Communication: Mobility status PT Visit Diagnosis: Unsteadiness on feet (R26.81);Muscle weakness (generalized) (M62.81);Difficulty in walking, not elsewhere classified (R26.2)    Time: 4818-5631 PT Time Calculation (min) (ACUTE ONLY): 33 min   Charges:   PT Evaluation $PT Eval Moderate Complexity: 1 Mod PT Treatments $Gait Training: 8-22 mins        Leighton Roach, PT  Acute Rehab Services  Pager [phone] Office [place] 05/25/2018, 2:40 PM

## 2018-05-25 NOTE — Progress Notes (Addendum)
PROGRESS NOTE                                                                                                                                                                                                             Patient Demographics:    [name], is a 72 y.o. female, DOB - 1946-01-31, KWI:[id number]  Admit date - 05/24/2018   Admitting Physician Julia Bongo, MD  Outpatient Primary MD for the patient is Julia Ruff, MD  LOS - 0      Brief Narrative  [name] is a 72 y.o. female with medical history significant of hypothyroidism; HTN; HLD; and DM presenting with AMS. Her sister provided the history, as the patient is obtunded.  She reports that a friend was visiting the patient and that afterwards the friend reported to the nursing staff that she was "not acting quite right."  The nursing staff went to check on the patient and found her unresponsive.  The facility called the sister about 11AM to report that the patient was being transported to [facility].  Since the sister and her husband's arrival, the patient has been sleeping and unresponsive; she "doesn't usually sleep well" so this is unusual for her.  The sister last spoke to her about a week ago.  The facility did report that she apparently didn't feel well yesterday.  She moved into the facility about 3 weeks ago and had significant anxiety leading up to the move.  However, she has seemed to be happy there recently.     Her family does not believe that she would have intentionally taken her medications wrong, but this could have been an inadvertent medication misadventure.  The patient was started on a new medication for anxiety about 3-4 weeks ago, around the time she moved into the facility.    Subjective:    [name] today has, No headache, No chest pain, No abdominal pain - No Nausea, No new weakness tingling or numbness, No Cough - SOB.     Assessment  & Plan :     AMS/ ?  Underlying undiagnosed Dementia with Pilar Plate delusions -   Patient was recently moved to assisted living facility 3 weeks ago, for the last 24 hours she was acting bizarrely at the assisted living facility and was brought in here by her sister, there was question that she might have accidentally overdosed on her Xanax and other psych (depression) medications, CT was unremarkable, she has no focal deficits, no signs  of infections, currently having delusions that she has been brought in here under a conspiracy.  Will check MRI to rule out a stroke, will check B12-TSH and RPR and also request psych to evaluate her.   Staff overheard patient stating to her sister - " I wanted to take more pills", have added safety sitter, Psych repaged x 2.   Depression - She was last seen at [facility] on 11/4, she was placed on Paxil, Abilify and Xanax.  She apparently had some suicidal ideation at that point.  Currently none.  Will continue Xanax only at nighttime and hold other medications.  Again psych consulted.   HTN - continue home dose ACE inhibitor, have increased beta-blocker dose for better control will continue to monitor.  HLD - Continue Lipitor  RA  - she is on a large number of DMARD medications - causing her to be chronically immunosuppressed. Resumed - Prednisone, Plaquenil, Arava,. Also on weekly methotrexate and biweekly Humira may resume them on discharge.   Hypokalemia.  Replaced.    DM2 vs stress related hyperglycemia.  Check A1c and monitor CBGs.  No results found for: HGBA1C CBG (last 3)  Recent Labs    05/24/18 1106  GLUCAP 100*     Family Communication  :  none  Code Status :  Full  Disposition Plan  :  TBD  Consults  :  Psych  Procedures  :    CT head - Non acute  MRI  DVT Prophylaxis  :  Lovenox   Lab Results  Component Value Date   PLT 107 (L) 05/25/2018    Diet :  Diet Order            Diet heart healthy/carb modified Room service appropriate? Yes; Fluid  consistency: Thin  Diet effective now               Inpatient Medications Scheduled Meds: . ALPRAZolam  0.5 mg Oral QHS  . aspirin EC  81 mg Oral Daily  . atorvastatin  40 mg Oral Daily  . chlorhexidine  15 mL Mouth Rinse BID  . cycloSPORINE  1 drop Both Eyes BID  . enoxaparin (LOVENOX) injection  40 mg Subcutaneous Q24H  . fluticasone  1-2 spray Each Nare Daily  . folic acid  1 mg Oral Daily  . hydroxychloroquine  400 mg Oral Daily  . leflunomide  20 mg Oral Daily  . lisinopril  10 mg Oral Daily  . mouth rinse  15 mL Mouth Rinse q12n4p  . metoprolol succinate  25 mg Oral Daily  . PARoxetine  60 mg Oral Daily  . predniSONE  5 mg Oral Q breakfast  . vitamin B-12  1,000 mcg Oral Daily  . Vitamin D  2,000 Units Oral Daily  . Vitamin D2  2,000 Units Oral Daily   Continuous Infusions: . lactated ringers 100 mL/hr at 05/25/18 0140   PRN Meds:.acetaminophen, LORazepam, [DISCONTINUED] ondansetron **OR** ondansetron (ZOFRAN) IV  Antibiotics  :   Anti-infectives (From admission, onward)   Start     Dose/Rate Route Frequency Ordered Stop   05/25/18 1000  hydroxychloroquine (PLAQUENIL) tablet 400 mg     400 mg Oral Daily 05/24/18 1405     05/25/18 0000  ceFEPIme (MAXIPIME) 2 g in sodium chloride 0.9 % 100 mL IVPB  Status:  Discontinued     2 g 200 mL/hr over 30 Minutes Intravenous Every 12 hours 05/24/18 1302 05/24/18 1405   05/25/18 0000  vancomycin (VANCOCIN) IVPB 750  mg/150 ml premix  Status:  Discontinued     750 mg 150 mL/hr over 60 Minutes Intravenous Every 12 hours 05/24/18 1302 05/24/18 1405   05/24/18 1145  ceFEPIme (MAXIPIME) 2 g in sodium chloride 0.9 % 100 mL IVPB     2 g 200 mL/hr over 30 Minutes Intravenous  Once 05/24/18 1138 05/24/18 1221   05/24/18 1145  metroNIDAZOLE (FLAGYL) IVPB 500 mg  Status:  Discontinued     500 mg 100 mL/hr over 60 Minutes Intravenous Every 8 hours 05/24/18 1138 05/24/18 1405   05/24/18 1145  vancomycin (VANCOCIN) IVPB 1000 mg/200 mL  premix     1,000 mg 200 mL/hr over 60 Minutes Intravenous  Once 05/24/18 1138 05/24/18 1531          Objective:   Vitals:   05/24/18 1558 05/24/18 2117 05/25/18 0440 05/25/18 0926  BP: 140/74 121/69 (!) 141/69 (!) 157/86  Pulse: 95 92 96 (!) 111  Resp: 18 20 16    Temp: 98.7 F (37.1 C) 97.7 F (36.5 C) 98.8 F (37.1 C)   TempSrc: Oral     SpO2: 100% 99% 99%   Weight:      Height:        Wt Readings from Last 3 Encounters:  05/24/18 70.3 kg  10/07/16 74.8 kg  05/13/16 73.5 kg     Intake/Output Summary (Last 24 hours) at 05/25/2018 1055 Last data filed at 05/25/2018 0522 Gross per 24 hour  Intake 400 ml  Output 1260 ml  Net -860 ml     Physical Exam  Awake Alert, No new F.N deficits, she has some delusions and has bizarre ideation, wants to talk in the presence of an attorney, she thinks she has been placed here as passed off and conspiracy [email or web address],PERRAL Supple Neck,No JVD, No cervical lymphadenopathy appriciated.  Symmetrical Chest wall movement, Good air movement bilaterally, CTAB RRR,No Gallops,Rubs or new Murmurs, No Parasternal Heave +ve B.Sounds, Abd Soft, No tenderness, No organomegaly appriciated, No rebound - guarding or rigidity. No Cyanosis, Clubbing or edema, No new Rash or bruise       Data Review:    CBC Recent Labs  Lab 05/24/18 1111 05/24/18 1134 05/25/18 0312  WBC 4.2  --  4.6  HGB 12.0 12.6 10.7*  HCT 39.8 37.0 35.1*  PLT 138*  --  107*  MCV 107.0*  --  104.5*  MCH 32.3  --  31.8  MCHC 30.2  --  30.5  RDW 14.2  --  13.9  LYMPHSABS 0.6*  --   --   MONOABS 0.7  --   --   EOSABS 0.1  --   --   BASOSABS 0.0  --   --     Chemistries  Recent Labs  Lab 05/24/18 1111 05/24/18 1134 05/25/18 0312  NA 141 141 141  K 3.4* 3.4* 3.3*  CL 108 108 111  CO2 24  --  24  GLUCOSE 103* 97 71  BUN 16 22 13   CREATININE 0.68 0.60 0.63  CALCIUM 8.5*  --  8.1*  AST 28  --   --   ALT 42  --   --   ALKPHOS 70  --   --   BILITOT 1.0  --    --    ------------------------------------------------------------------------------------------------------------------ No results for input(s): CHOL, HDL, LDLCALC, TRIG, CHOLHDL, LDLDIRECT in the last 72 hours.  No results found for: HGBA1C ------------------------------------------------------------------------------------------------------------------ No results for input(s): TSH, T4TOTAL, T3FREE, THYROIDAB in the last 72 hours.  Invalid input(s): FREET3 ------------------------------------------------------------------------------------------------------------------ No results for input(s): VITAMINB12, FOLATE, FERRITIN, TIBC, IRON, RETICCTPCT in the last 72 hours.  Coagulation profile No results for input(s): INR, PROTIME in the last 168 hours.  No results for input(s): DDIMER in the last 72 hours.  Cardiac Enzymes No results for input(s): CKMB, TROPONINI, MYOGLOBIN in the last 168 hours.  Invalid input(s): CK ------------------------------------------------------------------------------------------------------------------ No results found for: BNP  Micro Results Recent Results (from the past 240 hour(s))  Urine culture     Status: None   Collection Time: 05/24/18 11:17 AM  Result Value Ref Range Status   Specimen Description IN/OUT CATH URINE  Final   Special Requests NONE  Final   Culture   Final    NO GROWTH Performed at [facility], [address], [place], [place] [address]    Report Status 05/25/2018 FINAL  Final  MRSA PCR Screening     Status: None   Collection Time: 05/24/18  4:50 PM  Result Value Ref Range Status   MRSA by PCR NEGATIVE NEGATIVE Final    Comment:        The GeneXpert MRSA Assay (FDA approved for NASAL specimens only), is one component of a comprehensive MRSA colonization surveillance program. It is not intended to diagnose MRSA infection nor to guide or monitor treatment for MRSA infections. Performed at [facility],  [place] [address] [place], [place] [address]     Radiology Reports Ct Head Wo Contrast  Result Date: 05/24/2018 CLINICAL DATA:  72 year old female found down at home. Unresponsive. Initial encounter. EXAM: CT HEAD WITHOUT CONTRAST TECHNIQUE: Contiguous axial images were obtained from the base of the skull through the vertex without intravenous contrast. COMPARISON:  01/31/2008 head CT. FINDINGS: Brain: Significant streak artifact through posterior fossa. Taking this limitation into account, no intracranial hemorrhage or CT evidence of large acute infarct noted. Mild global atrophy most notable parietal lobes. No intracranial mass lesion detected on this unenhanced exam. Vascular: No acute hyperdense vessel. Atherosclerotic changes cavernous segment carotid arteries. Skull: No skull fracture. Sinuses/Orbits: No acute orbital abnormality. Visualized paranasal sinuses, mastoid air cells and middle ear cavities are clear. Other: Nasal trumpet is in place. IMPRESSION: 1. Significant streak artifact through posterior fossa. Taking this limitation into account, no intracranial hemorrhage or CT evidence of large acute infarct noted. 2. Mild global atrophy most notable parietal lobes. Electronically Signed   By: Genia Del M.D.   On: 05/24/2018 11:45   Dg Chest Port 1 View  Result Date: 05/24/2018 CLINICAL DATA:  Altered mental status EXAM: PORTABLE CHEST 1 VIEW COMPARISON:  12/11/2016 FINDINGS: Low lung volumes with bibasilar atelectasis. Heart is upper limits normal in size. No effusions or acute bony abnormality. IMPRESSION: Low lung volumes, bibasilar atelectasis. Electronically Signed   By: Rolm Baptise M.D.   On: 05/24/2018 11:52    Time Spent in minutes  30   Lala Lund M.D on 05/25/2018 at 10:55 AM  To page go to [email or web address] - password [facility]

## 2018-05-25 NOTE — Progress Notes (Signed)
Phone consult with Dr. Candiss Norse, patient initially via EMS with encephalopathy, medical work up unremarkable. Family has concerns about possible intentional/unintentional overdose with medications. She was recently seen at [facility] 10/10 and 11/4, where she was started on Paxil due to worsening depression and suicidal thoughts. The worsening depression and anxiety was thought to be related to an upcoming move in an assisted living facility. Paxil was increased on 11/4, and has been held since admission to the hospital. Will continue with current medications recommendations to hold Paxil due to multiple possible drug to drug interactions to include BZD and anticoagulants. Paxil can also worsen alzheimer and other cognitive disorders, in about to increasing hypersomnia. Patient with TSH of.571, she does have a history of hypothyroidism. Will add free -t4, unable to determine if thyroid has been managed and if she would have better results with higher TSH. WIll recommend inpatient gero-psych once medically cleared.

## 2018-05-25 NOTE — Progress Notes (Signed)
Patient retaining urine of greater than 806 ml . Text paged Keturah Shavers . Orders received for in and out cath

## 2018-05-25 NOTE — Evaluation (Signed)
Clinical/Bedside Swallow Evaluation Patient Details  Name: [name] MRN: [id number] Date of Birth: 12-Jul-1946  Today's Date: 05/25/2018 Time: SLP Start Time (ACUTE ONLY): 0901 SLP Stop Time (ACUTE ONLY): 0932 SLP Time Calculation (min) (ACUTE ONLY): 31 min  Past Medical History:  Past Medical History:  Diagnosis Date  . Anxiety   . Arthritis    rhematoid,osteoarthritis  . Diabetes mellitus without complication ([place])   . Elevated cholesterol   . Hemorrhoids   . Hypertension   . Hypothyroidism   . Joint pain   . Nocturia   . Sleeping difficulties    Past Surgical History:  Past Surgical History:  Procedure Laterality Date  . ABDOMINAL HYSTERECTOMY    . BACK SURGERY    . CERVICAL FUSION  2004  . CHOLECYSTECTOMY    . EYE SURGERY Left    cataract  . PARS PLANA REPAIR OF RETINAL DEATACHMENT    . THYROIDECTOMY, PARTIAL     HPI:  Pt is a 72 y.o. female with medical history significant of hypothyroidism; HTN; HLD; anxiety; and DM, presenting with AMS with question of possible overdose.   Assessment / Plan / Recommendation Clinical Impression  Pt is wary of trying POs offered by SLP, primarily taking very small bites/sips at a time, and needing Mod cues to initiate and then continue intake. However, what she does swallow, she consumes without overt signs of difficulty. Recommend starting regular diet textures and thin liquids. Pt took meds whole in thin liquid during eval without difficulty as well, although she does use a head toss. No acute SLP f/u indicated.  SLP Visit Diagnosis: Dysphagia, unspecified (R13.10)    Aspiration Risk  Mild aspiration risk    Diet Recommendation Regular;Thin liquid   Liquid Administration via: Cup;Straw Medication Administration: Whole meds with liquid Supervision: Patient able to self feed;Intermittent supervision to cue for compensatory strategies(?if pt would consume more if not being directly observed) Compensations: Slow  rate;Small sips/bites Postural Changes: Seated upright at 90 degrees    Other  Recommendations Oral Care Recommendations: Oral care BID   Follow up Recommendations 24 hour supervision/assistance      Frequency and Duration            Prognosis        Swallow Study   General HPI: Pt is a 72 y.o. female with medical history significant of hypothyroidism; HTN; HLD; anxiety; and DM, presenting with AMS with question of possible overdose. Type of Study: Bedside Swallow Evaluation Previous Swallow Assessment: none in chart Diet Prior to this Study: NPO Temperature Spikes Noted: No Respiratory Status: Room air History of Recent Intubation: No Behavior/Cognition: Alert;Requires cueing Oral Cavity Assessment: Within Functional Limits Oral Care Completed by SLP: No Oral Cavity - Dentition: Dentures, top;Other (Comment)(adequate dentition on bottom) Vision: Functional for self-feeding Self-Feeding Abilities: Able to feed self Patient Positioning: Upright in bed Baseline Vocal Quality: Normal Volitional Cough: Strong Volitional Swallow: Able to elicit    Oral/Motor/Sensory Function Overall Oral Motor/Sensory Function: Within functional limits(as pt will participate in testing)   Ice Chips Ice chips: Not tested   Thin Liquid Thin Liquid: Within functional limits Presentation: Cup;Self Fed;Straw    Nectar Thick Nectar Thick Liquid: Not tested   Honey Thick Honey Thick Liquid: Not tested   Puree Puree: Within functional limits Presentation: Self Fed;Spoon   Solid     Solid: Within functional limits Presentation: Self Ennis Forts 05/25/2018,9:45 AM  Germain Osgood, M.A. [facility]  Pager [phone] Office [phone]

## 2018-05-26 DIAGNOSIS — F333 Major depressive disorder, recurrent, severe with psychotic symptoms: Secondary | ICD-10-CM | POA: Diagnosis not present

## 2018-05-26 DIAGNOSIS — E119 Type 2 diabetes mellitus without complications: Secondary | ICD-10-CM | POA: Diagnosis not present

## 2018-05-26 DIAGNOSIS — R40241 Glasgow coma scale score 13-15, unspecified time: Secondary | ICD-10-CM | POA: Diagnosis not present

## 2018-05-26 DIAGNOSIS — E785 Hyperlipidemia, unspecified: Secondary | ICD-10-CM

## 2018-05-26 DIAGNOSIS — I10 Essential (primary) hypertension: Secondary | ICD-10-CM

## 2018-05-26 DIAGNOSIS — R4182 Altered mental status, unspecified: Secondary | ICD-10-CM | POA: Diagnosis not present

## 2018-05-26 DIAGNOSIS — M069 Rheumatoid arthritis, unspecified: Secondary | ICD-10-CM | POA: Diagnosis not present

## 2018-05-26 DIAGNOSIS — E876 Hypokalemia: Secondary | ICD-10-CM | POA: Diagnosis not present

## 2018-05-26 DIAGNOSIS — F329 Major depressive disorder, single episode, unspecified: Secondary | ICD-10-CM | POA: Diagnosis not present

## 2018-05-26 LAB — GLUCOSE, CAPILLARY: GLUCOSE-CAPILLARY: 83 mg/dL (ref 70–99)

## 2018-05-26 LAB — CBC
HCT: 35.2 % — ABNORMAL LOW (ref 36.0–46.0)
Hemoglobin: 10.8 g/dL — ABNORMAL LOW (ref 12.0–15.0)
MCH: 31.9 pg (ref 26.0–34.0)
MCHC: 30.7 g/dL (ref 30.0–36.0)
MCV: 103.8 fL — AB (ref 80.0–100.0)
NRBC: 0 % (ref 0.0–0.2)
PLATELETS: 124 10*3/uL — AB (ref 150–400)
RBC: 3.39 MIL/uL — AB (ref 3.87–5.11)
RDW: 14 % (ref 11.5–15.5)
WBC: 3.5 10*3/uL — ABNORMAL LOW (ref 4.0–10.5)

## 2018-05-26 LAB — BASIC METABOLIC PANEL
ANION GAP: 6 (ref 5–15)
BUN: 15 mg/dL (ref 8–23)
CALCIUM: 8.6 mg/dL — AB (ref 8.9–10.3)
CO2: 25 mmol/L (ref 22–32)
Chloride: 108 mmol/L (ref 98–111)
Creatinine, Ser: 0.71 mg/dL (ref 0.44–1.00)
GFR calc non Af Amer: >60 mL/min (ref >60)
Glucose, Bld: 82 mg/dL (ref 70–99)
Potassium: 3.4 mmol/L — ABNORMAL LOW (ref 3.5–5.1)
Sodium: 139 mmol/L (ref 135–145)

## 2018-05-26 LAB — FOLATE RBC
FOLATE, HEMOLYSATE: 340.4 ng/mL
FOLATE, RBC: 981 ng/mL (ref >498)
HEMATOCRIT: 34.7 % (ref 34.0–46.6)

## 2018-05-26 LAB — RPR: RPR Ser Ql: NONREACTIVE

## 2018-05-26 LAB — MAGNESIUM: Magnesium: 2 mg/dL (ref 1.7–2.4)

## 2018-05-26 MED ORDER — CLONAZEPAM 0.5 MG PO TABS
0.5000 mg | ORAL_TABLET | Freq: Every day | ORAL | Status: DC
  Administered 2018-05-27 – 2018-05-31 (×5): 0.5 mg via ORAL
  Filled 2018-05-26 (×6): qty 1

## 2018-05-26 MED ORDER — RISPERIDONE 0.5 MG PO TABS
0.5000 mg | ORAL_TABLET | Freq: Every day | ORAL | Status: DC
  Administered 2018-05-27 – 2018-05-31 (×4): 0.5 mg via ORAL
  Filled 2018-05-26 (×6): qty 1

## 2018-05-26 MED ORDER — ESCITALOPRAM OXALATE 10 MG PO TABS
5.0000 mg | ORAL_TABLET | Freq: Every day | ORAL | Status: DC
  Administered 2018-05-26 – 2018-06-01 (×6): 5 mg via ORAL
  Filled 2018-05-26 (×6): qty 1

## 2018-05-26 NOTE — Consult Note (Addendum)
[facility] Consult   Reason for Consult:  Delusions  Referring Physician:  Dr. Candiss Norse Patient Identification: [name] MRN:  [id number] Principal Diagnosis: MDD (major depressive disorder), recurrent, severe, with psychosis ([place]) Diagnosis:   Patient Active Problem List   Diagnosis Date Noted  . Altered mental status [R41.82] 05/24/2018  . Ketonuria [R82.4] 05/24/2018  . Pain in right hand [M79.641] 09/11/2017  . Hypertension [I10] 03/07/2014  . Hyperlipidemia [E78.5] 03/07/2014  . Depression [F32.9] 03/07/2014  . Spinal stenosis, lumbar region, with neurogenic claudication [M48.062] 02/24/2014  . Type 2 diabetes mellitus without complication ([place]) [K09.3] 02/24/2014  . Rheumatoid arthritis ([place]) [M06.9] 02/24/2014  . Scoliosis of lumbar spine [M41.9] 02/13/2014    Total Time spent with patient: 1 hour  Subjective:   [name] is a 72 y.o. female patient admitted with AMS.  HPI:   Per chart review, patient was admitted with AMS. She was found unresponsive at her nursing home. She moved to the facility 3 weeks ago and has had significant anxiety leading up to the move but she seemed to be happy there recently. She was started on a new medication for anxiety 3-4 weeks ago. Home medications include Abilify 10 mg daily, Xanax 0.5 mg TID and Paxil 60 mg daily. Xanax was last filled on 10/10 (#90). She was last seen at the [facility] on 11/4 for depression (denied SI at this time) and 10/10 for SI with plan to overdose. There is concern that she intentionally overdosed on her medications. PT overhead her speaking to her sister. She told her her sister that she intentionally took too many of her medications. She has a depressed affect.   On interview, [name] has her family (sister and brother-in-law) and family friend by her bedside.  She provides verbal consent for them to remain present for the interview.  She endorses delusional and paranoid beliefs.   She reports that she was set up and points to her friend.  She reports taking her medication because she felt like "they were going to ruin her life."  She reports taking an unknown amount of Xanax.  She denies ingesting other medications.  She believes that "someone stepped in and tried to do her in."  She reports a history of depression and anxiety since her 33s.  She reports that her current episode started a year ago.  She started Paxil in April.  She feels like it was initially helpful.  She additionally reports AH.  She can hear people speaking to her.  She reports that this has been occurring for the past few months.  She denies AVH in the past.  She reports poor sleep with multiple nighttime awakenings.  She denies problems with appetite.  She reports that she continues to enjoy participating in activities such as bingo at her living facility.  She denies current SI, HI or VH.  She is oriented to person, place and time.  Her sister reports that she informed her that she was feeling better when she saw her a week ago.    Past Psychiatric History: Mood disorder, OCD, anxiety and depression.   Risk to Self:  Yes with recent suicide attempt.  Risk to Others:  None. Denies HI. Prior Inpatient Therapy:  Denies  Prior Outpatient Therapy:  She is followed by [facility]. She sees [facility], [place].   Past Medical History:  Past Medical History:  Diagnosis Date  . Anxiety   . Arthritis    rhematoid,osteoarthritis  .  Diabetes mellitus without complication ([place])   . Elevated cholesterol   . Hemorrhoids   . Hypertension   . Hypothyroidism   . Joint pain   . Nocturia   . Sleeping difficulties     Past Surgical History:  Procedure Laterality Date  . ABDOMINAL HYSTERECTOMY    . BACK SURGERY    . CERVICAL FUSION  2004  . CHOLECYSTECTOMY    . EYE SURGERY Left    cataract  . PARS PLANA REPAIR OF RETINAL DEATACHMENT    . THYROIDECTOMY, PARTIAL     Family History:  Family History  Problem  Relation Age of Onset  . Diabetes Mother   . Breast cancer Mother   . Gout Mother   . Alzheimer's disease Father   . Heart Problems Father   . Breast cancer Sister   . Alzheimer's disease Sister   . Diabetes Sister   . Gout Sister   . Heart Problems Sister   . Alzheimer's disease Brother   . Diabetes Brother   . Heart Problems Brother    Family Psychiatric  History: Father, brother and sister-Alzheimer's disease.   Social History:  Social History   Substance and Sexual Activity  Alcohol Use No     Social History   Substance and Sexual Activity  Drug Use No    Social History   Socioeconomic History  . Marital status: Widowed    Spouse name: Not on file  . Number of children: 0  . Years of education: HS  . Highest education level: Not on file  Occupational History  . Not on file  Social Needs  . Financial resource strain: Not on file  . Food insecurity:    Worry: Not on file    Inability: Not on file  . Transportation needs:    Medical: Not on file    Non-medical: Not on file  Tobacco Use  . Smoking status: Never Smoker  . Smokeless tobacco: Never Used  Substance and Sexual Activity  . Alcohol use: No  . Drug use: No  . Sexual activity: Not on file  Lifestyle  . Physical activity:    Days per week: Not on file    Minutes per session: Not on file  . Stress: Not on file  Relationships  . Social connections:    Talks on phone: Not on file    Gets together: Not on file    Attends religious service: Not on file    Active member of club or organization: Not on file    Attends meetings of clubs or organizations: Not on file    Relationship status: Not on file  Other Topics Concern  . Not on file  Social History Narrative   Drinks 3 caffeine drinks a day    Additional Social History: She lives in an senior living facility since September. She denies alcohol or illicit drug use.     Allergies:   Allergies  Allergen Reactions  . Contrast Media  [Iodinated Diagnostic Agents] Anaphylaxis and Hives    Only the retina dye;   . Abilify [Aripiprazole]   . Ultram [Tramadol Hcl]   . Zithromax [Azithromycin]     Labs:  Results for orders placed or performed during the hospital encounter of 05/24/18 (from the past 48 hour(s))  I-Stat CG4 Lactic Acid, ED     Status: None   Collection Time: 05/24/18  1:42 PM  Result Value Ref Range   Lactic Acid, Venous 0.93 0.5 - 1.9 mmol/L  MRSA PCR Screening     Status: None   Collection Time: 05/24/18  4:50 PM  Result Value Ref Range   MRSA by PCR NEGATIVE NEGATIVE    Comment:        The GeneXpert MRSA Assay (FDA approved for NASAL specimens only), is one component of a comprehensive MRSA colonization surveillance program. It is not intended to diagnose MRSA infection nor to guide or monitor treatment for MRSA infections. Performed at [facility], [place] [address], [place], [place] [address]   Basic metabolic panel     Status: Abnormal   Collection Time: 05/25/18  3:12 AM  Result Value Ref Range   Sodium 141 135 - 145 mmol/L   Potassium 3.3 (L) 3.5 - 5.1 mmol/L   Chloride 111 98 - 111 mmol/L   CO2 24 22 - 32 mmol/L   Glucose, Bld 71 70 - 99 mg/dL   BUN 13 8 - 23 mg/dL   Creatinine, Ser 0.63 0.44 - 1.00 mg/dL   Calcium 8.1 (L) 8.9 - 10.3 mg/dL   GFR calc non Af Amer >60 >60 mL/min   GFR calc Af Amer >60 >60 mL/min    Comment: (NOTE) The eGFR has been calculated using the CKD EPI equation. This calculation has not been validated in all clinical situations. eGFR's persistently <60 mL/min signify possible Chronic Kidney Disease.    Anion gap 6 5 - 15    Comment: Performed at [facility] [address] [place], [place] [address]  CBC     Status: Abnormal   Collection Time: 05/25/18  3:12 AM  Result Value Ref Range   WBC 4.6 4.0 - 10.5 K/uL   RBC 3.36 (L) 3.87 - 5.11 MIL/uL   Hemoglobin 10.7 (L) 12.0 - 15.0 g/dL   HCT 35.1 (L) 36.0 - 46.0 %   MCV 104.5 (H) 80.0 -  100.0 fL   MCH 31.8 26.0 - 34.0 pg   MCHC 30.5 30.0 - 36.0 g/dL   RDW 13.9 11.5 - 15.5 %   Platelets 107 (L) 150 - 400 K/uL    Comment: REPEATED TO VERIFY PLATELET COUNT CONFIRMED BY SMEAR Immature Platelet Fraction may be clinically indicated, consider ordering this additional test [id number]    nRBC 0.0 0.0 - 0.2 %    Comment: Performed at [facility], [place] [address] [place], [place] [address]  T4, free     Status: None   Collection Time: 05/25/18 11:07 AM  Result Value Ref Range   Free T4 1.05 0.82 - 1.77 ng/dL    Comment: (NOTE) Biotin ingestion may interfere with free T4 tests. If the results are inconsistent with the TSH level, previous test results, or the clinical presentation, then consider biotin interference. If needed, order repeat testing after stopping biotin. Performed at [facility], [place] [address], [place], [place] [address]   TSH     Status: None   Collection Time: 05/25/18 11:25 AM  Result Value Ref Range   TSH 0.571 0.350 - 4.500 uIU/mL    Comment: Performed by a 3rd Generation assay with a functional sensitivity of <=0.01 uIU/mL. Performed at [facility], [place] [address] [place], [place] [address]   Vitamin B12     Status: Abnormal   Collection Time: 05/25/18 11:25 AM  Result Value Ref Range   Vitamin B-12 935 (H) 180 - 914 pg/mL    Comment: (NOTE) This assay is not validated for testing neonatal or  myeloproliferative syndrome specimens for Vitamin B12 levels. Performed at [facility], [place] [address] [place], [place] [address]   RPR     Status: None   Collection Time: 05/25/18 11:25 AM  Result Value Ref Range   RPR Ser Ql Non Reactive Non Reactive    Comment: (NOTE) Performed At: [facility] [place], [place] [id number] Rush Farmer MD [id number]   Hemoglobin A1c     Status: None   Collection Time: 05/25/18 11:38 AM  Result Value Ref Range   Hgb A1c MFr Bld 5.3 4.8 - 5.6 %    Comment:  (NOTE) Pre diabetes:          5.7%-6.4% Diabetes:              >6.4% Glycemic control for   <7.0% adults with diabetes    Mean Plasma Glucose 105.41 mg/dL    Comment: Performed at [facility] [address], [place], [place] [address]  CBC     Status: Abnormal   Collection Time: 05/26/18  3:25 AM  Result Value Ref Range   WBC 3.5 (L) 4.0 - 10.5 K/uL   RBC 3.39 (L) 3.87 - 5.11 MIL/uL   Hemoglobin 10.8 (L) 12.0 - 15.0 g/dL   HCT 35.2 (L) 36.0 - 46.0 %   MCV 103.8 (H) 80.0 - 100.0 fL   MCH 31.9 26.0 - 34.0 pg   MCHC 30.7 30.0 - 36.0 g/dL   RDW 14.0 11.5 - 15.5 %   Platelets 124 (L) 150 - 400 K/uL   nRBC 0.0 0.0 - 0.2 %    Comment: Performed at [facility], [place] [address], [place], [place] [address]  Magnesium     Status: None   Collection Time: 05/26/18  3:25 AM  Result Value Ref Range   Magnesium 2.0 1.7 - 2.4 mg/dL    Comment: Performed at [facility], [place] [address] [place], [place] [address]  Basic metabolic panel     Status: Abnormal   Collection Time: 05/26/18  3:25 AM  Result Value Ref Range   Sodium 139 135 - 145 mmol/L   Potassium 3.4 (L) 3.5 - 5.1 mmol/L   Chloride 108 98 - 111 mmol/L   CO2 25 22 - 32 mmol/L   Glucose, Bld 82 70 - 99 mg/dL   BUN 15 8 - 23 mg/dL   Creatinine, Ser 0.71 0.44 - 1.00 mg/dL   Calcium 8.6 (L) 8.9 - 10.3 mg/dL   GFR calc non Af Amer >60 >60 mL/min   GFR calc Af Amer >60 >60 mL/min    Comment: (NOTE) The eGFR has been calculated using the CKD EPI equation. This calculation has not been validated in all clinical situations. eGFR's persistently <60 mL/min signify possible Chronic Kidney Disease.    Anion gap 6 5 - 15    Comment: Performed at [facility] [address], [place], [place] [address]    Current Facility-Administered Medications  Medication Dose Route Frequency Provider Last Rate Last Dose  . acetaminophen (TYLENOL) tablet 650 mg  650 mg Oral Q6H PRN Thurnell Lose, MD      . ALPRAZolam  Duanne Moron) tablet 0.5 mg  0.5 mg Oral QHS Thurnell Lose, MD   0.5 mg at 05/25/18 2131  . aspirin EC tablet 81 mg  81 mg Oral Daily Karmen Bongo, MD   81 mg at 05/26/18 1032  . atorvastatin (LIPITOR) tablet 40 mg  40 mg Oral Daily Karmen Bongo, MD   40 mg at 05/26/18 1033  . chlorhexidine (PERIDEX) 0.12 % solution 15 mL  15 mL Mouth Rinse BID Karmen Bongo, MD   15 mL at 05/25/18 2131  . cholecalciferol (VITAMIN D3) tablet 2,000 Units  2,000 Units Oral Daily Thurnell Lose, MD   2,000 Units at 05/26/18 1031  . cycloSPORINE (RESTASIS) 0.05 % ophthalmic emulsion 1 drop  1 drop Both Eyes BID Karmen Bongo, MD   1 drop at 05/26/18 1055  . enoxaparin (LOVENOX) injection 40 mg  40 mg Subcutaneous Q24H Karmen Bongo, MD   40 mg at 05/25/18 1738  . feeding supplement (ENSURE ENLIVE) (ENSURE ENLIVE) liquid 237 mL  237 mL Oral BID BM Thurnell Lose, MD   237 mL at 05/25/18 1257  . fluticasone (FLONASE) 50 MCG/ACT nasal spray 1-2 spray  1-2 spray Each Nare Daily Karmen Bongo, MD   2 spray at 05/26/18 1119  . folic acid (FOLVITE) tablet 1 mg  1 mg Oral Daily Thurnell Lose, MD   1 mg at 05/26/18 1032  . hydroxychloroquine (PLAQUENIL) tablet 400 mg  400 mg Oral Daily Karmen Bongo, MD   400 mg at 05/26/18 1034  . leflunomide (ARAVA) tablet 20 mg  20 mg Oral Daily Karmen Bongo, MD   20 mg at 05/26/18 1034  . lisinopril (PRINIVIL,ZESTRIL) tablet 10 mg  10 mg Oral Daily Karmen Bongo, MD   10 mg at 05/26/18 1033  . LORazepam (ATIVAN) injection 1 mg  1 mg Intravenous Once PRN Thurnell Lose, MD      . MEDLINE mouth rinse  15 mL Mouth Rinse q12n4p Karmen Bongo, MD   15 mL at 05/25/18 1257  . metoprolol tartrate (LOPRESSOR) tablet 50 mg  50 mg Oral BID Thurnell Lose, MD   50 mg at 05/26/18 1032  . multivitamin with minerals tablet 1 tablet  1 tablet Oral Daily Thurnell Lose, MD   1 tablet at 05/25/18 1307  . ondansetron (ZOFRAN) injection 4 mg  4 mg Intravenous Q6H PRN  Karmen Bongo, MD      . predniSONE (DELTASONE) tablet 5 mg  5 mg Oral Q breakfast Karmen Bongo, MD   5 mg at 05/26/18 1033  . vitamin B-12 (CYANOCOBALAMIN) tablet 1,000 mcg  1,000 mcg Oral Daily Thurnell Lose, MD   1,000 mcg at 05/26/18 1033    Musculoskeletal: Strength & Muscle Tone: within normal limits Gait & Station: UTA since patient is sitting in a chair. Patient leans: N/A  Psychiatric Specialty Exam: Physical Exam  Nursing note and vitals reviewed. Constitutional: She is oriented to person, place, and time. She appears well-developed and well-nourished.  HENT:  Head: Normocephalic and atraumatic.  Neck: Normal range of motion.  Respiratory: Effort normal.  Musculoskeletal: Normal range of motion.  Neurological: She is alert and oriented to person, place, and time.  Psychiatric: Her speech is normal and behavior is normal. Judgment normal. Thought content is paranoid and delusional. Cognition and memory are impaired. She exhibits a depressed mood.    Review of Systems  Constitutional: Negative for chills and fever.  Cardiovascular: Negative for chest pain.  Gastrointestinal: Positive for diarrhea. Negative for abdominal pain, constipation, nausea and vomiting.  Psychiatric/Behavioral: Positive for depression and hallucinations (AH). Negative for substance abuse and suicidal ideas. The patient is nervous/anxious and has insomnia.   All other systems reviewed and are negative.   Blood pressure 140/71, pulse 85, temperature 98.2 F (  36.8 C), resp. rate 16, height 5' 4" (1.626 m), weight 70.3 kg, SpO2 97 %.Body mass index is 26.61 kg/m.  General Appearance: Fairly Groomed, elderly, Caucasian female, wearing a hospital gown with short hair who is sitting in a chair. NAD.   Eye Contact:  Good  Speech:  Clear and Coherent and Normal Rate  Volume:  Normal  Mood:  Depressed  Affect:  Congruent  Thought Process:  Linear and Descriptions of Associations: Intact   Orientation:  Full (Time, Place, and Person)  Thought Content:  Delusions and Paranoid Ideation  Suicidal Thoughts:  No  Homicidal Thoughts:  No  Memory:  Immediate;   Good Recent;   Good Remote;   Good  Judgement:  Impaired  Insight:  Fair  Psychomotor Activity:  Normal  Concentration:  Concentration: Good and Attention Span: Good  Recall:  Good  Fund of Knowledge:  Good  Language:  Good  Akathisia:  No  Handed:  Right  AIMS (if indicated):   N/A  Assets:  Communication Skills Housing Social Support  ADL's:  Intact  Cognition: Impaired due to psychiatric condition.   Sleep:   Poor   Assessment:  BROOKLEY SPITLER is a 72 y.o. female who was admitted with AMS. She was found unresponsive at her nursing home secondary to intentional Xanax overdose in a suicide attempt. She reports worsening depressive symptoms for the past few weeks with onset of paranoia and AH several months ago. She also reports problems with sleep. She denies current SI or HI. She warrants inpatient psychiatric hospitalization for stabilization and treatment.   Treatment Plan Summary: -Patient warrants inpatient psychiatric hospitalization given high risk of harm to self. -Continue bedside sitter.  -Start Risperdal 0.5 mg qhs for psychosis.  -Start Lexapro 5 mg daily for depression.  -Switch Xanax to Klonopin 0.25 mg qhs for insomnia/anxiety. Will provide better coverage than Xanax and less risk for rebound anxiety. Medication should be tapered to discontinuation when admitted to inpatient psychiatric hospital given high risk of falls and dementia in the elderly population as well as risk for death/respiratory depression with overdose.   -EKG reviewed and QTc 489 on 11/11. Please closely monitor when starting or increasing QTc prolonging agents.  -Please pursue involuntary commitment if patient refuses voluntary psychiatric hospitalization or attempts to leave the hospital.  -Will sign off on patient at this  time. Please consult psychiatry again as needed.    Disposition: Recommend psychiatric Inpatient admission when medically cleared.  Faythe Dingwall, DO 05/26/2018 1:20 PM

## 2018-05-26 NOTE — Plan of Care (Signed)
Calm and cooperative no new complain will continue to monitor

## 2018-05-26 NOTE — Evaluation (Signed)
Occupational Therapy Evaluation Patient Details Name: [name] MRN: [id number] DOB: 12-12-1945 Today's Date: 05/26/2018    History of Present Illness [name] is a 72 y.o. female with medical history significant of hypothyroidism; HTN; HLD; and DM presenting with AMS.   Clinical Impression   Pt admitted with the above diagnoses and presents with below problem list. Pt will benefit from continued acute OT to address the below listed deficits and maximize independence with basic ADLs. PTA pt was independent with ADLs. Pt is currently supervision to min A with ADLs and functional transfers/mobility. Flat affect, increased time needed to complete basic ADL tasks. Some paranoia when family stepped out during session but able to redirect. Tearful at times. With encouragement, pt sitting up in recliner at end of session with lunch tray present. If she goes to inpt behavioral health, recommend OT there, if not, recommend SNF for rehab.      Follow Up Recommendations  SNF;Other (comment)(vs inpatient psych)    Equipment Recommendations  Other (comment)(defer to next venue)    Recommendations for Other Services       Precautions / Restrictions Precautions Precautions: Fall;Other (comment)(suicide) Restrictions Weight Bearing Restrictions: No      Mobility Bed Mobility Overal bed mobility: Needs Assistance Bed Mobility: Supine to Sit     Supine to sit: Min guard     General bed mobility comments: min guard for safety. some extra time and effort. no physical assist.  Transfers Overall transfer level: Needs assistance Equipment used: 1 person hand held assist Transfers: Sit to/from Stand Sit to Stand: Min assist         General transfer comment: min steadying assist. seeks single UE external support.     Balance Overall balance assessment: Needs assistance Sitting-balance support: No upper extremity supported Sitting balance-Leahy Scale: Good      Standing balance support: Single extremity supported Standing balance-Leahy Scale: Fair Standing balance comment: poor when fatigued                           ADL either performed or assessed with clinical judgement   ADL Overall ADL's : Needs assistance/impaired     Grooming: Wash/dry face;Wash/dry hands;Standing;Min guard                   Armed forces technical officer: Minimal assistance;Comfort height toilet;Grab bars   Toileting- Clothing Manipulation and Hygiene: Minimal assistance;Sit to/from stand       Functional mobility during ADLs: Minimal assistance;Min guard General ADL Comments: Pt completed toilet transfer, pericare, grooming tasks in standing and sitting. Min steadying assist for dynamic tasks in standing     Vision         Perception     Praxis      Pertinent Vitals/Pain Pain Assessment: Faces Faces Pain Scale: Hurts a little bit Pain Location: "all over" Pain Intervention(s): Monitored during session     Hand Dominance     Extremity/Trunk Assessment Upper Extremity Assessment Upper Extremity Assessment: Generalized weakness   Lower Extremity Assessment Lower Extremity Assessment: Defer to PT evaluation   Cervical / Trunk Assessment Cervical / Trunk Assessment: Kyphotic   Communication Communication Communication: No difficulties   Cognition Arousal/Alertness: Awake/alert Behavior During Therapy: Flat affect Overall Cognitive Status: Impaired/Different from baseline Area of Impairment: Orientation;Memory;Safety/judgement;Problem solving                     Memory: Decreased short-term memory   Safety/Judgement: Decreased  awareness of safety;Decreased awareness of deficits   Problem Solving: Slow processing;Decreased initiation;Requires verbal cues;Requires tactile cues General Comments: pt very flat and demonstrates limited insight into deficits and current situation. Sister reports that this is not her baseline.     General Comments  Pt demonstrating some paranoid behavior when family stepped out during session. Able to reassure and redirect. Quickly tearful at times during session.    Exercises     Shoulder Instructions      Home Living Family/patient expects to be discharged to:: Private residence Living Arrangements: Alone Available Help at Discharge: Family;Available PRN/intermittently Type of Home: Independent living facility Home Access: Level entry     Home Layout: One level     Bathroom Shower/Tub: Teacher, early years/pre: Standard Bathroom Accessibility: Yes   Home Equipment: None   Additional Comments: pt has recently moved to the [facility] on [address]. She lives alone. Her niece and sister check on her regularly.       Prior Functioning/Environment Level of Independence: Independent        Comments: drives, doesn't cook much though.         OT Problem List: Decreased strength;Decreased activity tolerance;Impaired balance (sitting and/or standing);Decreased cognition;Decreased safety awareness;Decreased knowledge of use of DME or AE;Decreased knowledge of precautions;Pain      OT Treatment/Interventions: Self-care/ADL training;Therapeutic exercise;Energy conservation;DME and/or AE instruction;Therapeutic activities;Cognitive remediation/compensation;Patient/family education;Balance training    OT Goals(Current goals can be found in the care plan section) Acute Rehab OT Goals Patient Stated Goal: none stated Time For Goal Achievement: 06/09/18 Potential to Achieve Goals: Good ADL Goals Pt Will Perform Grooming: with modified independence;standing Pt Will Perform Lower Body Bathing: with modified independence;sit to/from stand Pt Will Perform Lower Body Dressing: with modified independence;sit to/from stand Pt Will Transfer to Toilet: with supervision;ambulating Pt Will Perform Toileting - Clothing Manipulation and hygiene: with supervision;sit to/from stand   OT Frequency: Min 2X/week   Barriers to D/C:            Co-evaluation              AM-PAC PT "6 Clicks" Daily Activity     Outcome Measure Help from another person eating meals?: None Help from another person taking care of personal grooming?: None Help from another person toileting, which includes using toliet, bedpan, or urinal?: A Little Help from another person bathing (including washing, rinsing, drying)?: A Little Help from another person to put on and taking off regular upper body clothing?: None Help from another person to put on and taking off regular lower body clothing?: A Little 6 Click Score: 21   End of Session Nurse Communication: (sitter present during session)  Activity Tolerance: Patient tolerated treatment well Patient left: in chair;with call bell/phone within reach;with nursing/sitter in room;with family/visitor present  OT Visit Diagnosis: Unsteadiness on feet (R26.81);Muscle weakness (generalized) (M62.81);Pain;Other symptoms and signs involving cognitive function                Time: 1610-9604 OT Time Calculation (min): 17 min Charges:  OT General Charges $OT Visit: 1 Visit OT Evaluation $OT Eval Low Complexity: Anna, OT Acute Rehabilitation Services Pager: [phone] Office: [phone]   Hortencia Pilar 05/26/2018, 12:55 PM

## 2018-05-26 NOTE — Progress Notes (Signed)
PROGRESS NOTE                                                                                                                                                                                                             Patient Demographics:    [name], is a 72 y.o. female, DOB - 08-May-1946, UXN:[id number]  Admit date - 05/24/2018   Admitting Physician Karmen Bongo, MD  Outpatient Primary MD for the patient is Leighton Ruff, MD  LOS - 0      Brief Narrative  [name] is a 72 y.o. female with medical history significant of hypothyroidism; HTN; HLD; and DM presenting with AMS. Her sister provided the history, as the patient is obtunded.  She reports that a friend was visiting the patient and that afterwards the friend reported to the nursing staff that she was "not acting quite right."  The nursing staff went to check on the patient and found her unresponsive.  The facility called the sister about 11AM to report that the patient was being transported to [facility].  Since the sister and her husband's arrival, the patient has been sleeping and unresponsive; she "doesn't usually sleep well" so this is unusual for her.  The sister last spoke to her about a week ago.  The facility did report that she apparently didn't feel well yesterday.  She moved into the facility about 3 weeks ago and had significant anxiety leading up to the move.  However, she has seemed to be happy there recently.     Her family does not believe that she would have intentionally taken her medications wrong, but this could have been an inadvertent medication misadventure.  The patient was started on a new medication for anxiety about 3-4 weeks ago, around the time she moved into the facility.    Subjective:   Patient in bed, appears comfortable, denies any headache, no fever, no chest pain or pressure, no shortness of breath , no abdominal pain. No focal weakness.  Does feel sad.   Assessment  &  Plan :     AMS/ ? Underlying undiagnosed Dementia with Pilar Plate delusions - intentional polypharmacy overdose with depressed affect, medically stable, psych consulted and likely will require inpatient psych admission.  Continue Air cabin crew.  Psych to adjust depression medications if needed.  Unremarkable CT head, stable MRI brain, stable B12 TSH and RPR.  Medically clear for disposition per psych.   Depression - She was last seen at [facility] on 11/4,  she was placed on Paxil, Abilify and Xanax.  She apparently had some suicidal ideation at that point.  Currently none.  Will continue Xanax only at nighttime and hold other medications.  Again psych consulted see #1 above.   HTN - continue home dose ACE inhibitor, have increased beta-blocker dose for better control will continue to monitor.  HLD - Continue Lipitor  RA  - she is on a large number of DMARD medications - causing her to be chronically immunosuppressed. Resumed - Prednisone, Plaquenil, Arava,. Also on weekly methotrexate and biweekly Humira may resume them on discharge.   Hypokalemia.  Replaced will recheck tomorrow.    Stress related hyperglycemia.  Stable A1c.  Lab Results  Component Value Date   HGBA1C 5.3 05/25/2018     Family Communication  :  none  Code Status :  Full  Disposition Plan  : Psych medically stable for discharge  Consults  :  Psych  Procedures  :    CT head - Non acute  MRI nonacute.  DVT Prophylaxis  :  Lovenox   Lab Results  Component Value Date   PLT 124 (L) 05/26/2018    Diet :  Diet Order            Diet heart healthy/carb modified Room service appropriate? Yes; Fluid consistency: Thin  Diet effective now               Inpatient Medications Scheduled Meds: . ALPRAZolam  0.5 mg Oral QHS  . aspirin EC  81 mg Oral Daily  . atorvastatin  40 mg Oral Daily  . chlorhexidine  15 mL Mouth Rinse BID  . cholecalciferol  2,000 Units Oral Daily  . cycloSPORINE  1 drop Both Eyes BID   . enoxaparin (LOVENOX) injection  40 mg Subcutaneous Q24H  . feeding supplement (ENSURE ENLIVE)  237 mL Oral BID BM  . fluticasone  1-2 spray Each Nare Daily  . folic acid  1 mg Oral Daily  . hydroxychloroquine  400 mg Oral Daily  . leflunomide  20 mg Oral Daily  . lisinopril  10 mg Oral Daily  . mouth rinse  15 mL Mouth Rinse q12n4p  . metoprolol tartrate  50 mg Oral BID  . multivitamin with minerals  1 tablet Oral Daily  . predniSONE  5 mg Oral Q breakfast  . vitamin B-12  1,000 mcg Oral Daily   Continuous Infusions: . lactated ringers Stopped (05/26/18 0834)   PRN Meds:.acetaminophen, LORazepam, [DISCONTINUED] ondansetron **OR** ondansetron (ZOFRAN) IV  Antibiotics  :   Anti-infectives (From admission, onward)   Start     Dose/Rate Route Frequency Ordered Stop   05/25/18 1000  hydroxychloroquine (PLAQUENIL) tablet 400 mg     400 mg Oral Daily 05/24/18 1405     05/25/18 0000  ceFEPIme (MAXIPIME) 2 g in sodium chloride 0.9 % 100 mL IVPB  Status:  Discontinued     2 g 200 mL/hr over 30 Minutes Intravenous Every 12 hours 05/24/18 1302 05/24/18 1405   05/25/18 0000  vancomycin (VANCOCIN) IVPB 750 mg/150 ml premix  Status:  Discontinued     750 mg 150 mL/hr over 60 Minutes Intravenous Every 12 hours 05/24/18 1302 05/24/18 1405   05/24/18 1145  ceFEPIme (MAXIPIME) 2 g in sodium chloride 0.9 % 100 mL IVPB     2 g 200 mL/hr over 30 Minutes Intravenous  Once 05/24/18 1138 05/24/18 1221   05/24/18 1145  metroNIDAZOLE (FLAGYL) IVPB 500 mg  Status:  Discontinued     500 mg 100 mL/hr over 60 Minutes Intravenous Every 8 hours 05/24/18 1138 05/24/18 1405   05/24/18 1145  vancomycin (VANCOCIN) IVPB 1000 mg/200 mL premix     1,000 mg 200 mL/hr over 60 Minutes Intravenous  Once 05/24/18 1138 05/24/18 1531          Objective:   Vitals:   05/25/18 1425 05/25/18 2052 05/26/18 0526 05/26/18 1029  BP: 105/83 (!) 107/41 (!) 151/79 140/71  Pulse: 66 82 87 85  Resp: 18 16 16    Temp: 98.9  F (37.2 C) 98.3 F (36.8 C) 98.2 F (36.8 C)   TempSrc:      SpO2:  96% 97%   Weight:      Height:        Wt Readings from Last 3 Encounters:  05/24/18 70.3 kg  10/07/16 74.8 kg  05/13/16 73.5 kg     Intake/Output Summary (Last 24 hours) at 05/26/2018 1033 Last data filed at 05/25/2018 2142 Gross per 24 hour  Intake 1315 ml  Output -  Net 1315 ml     Physical Exam  Awake Alert, Oriented X 3, No new F.N deficits, depressed affect [place].AT,PERRAL Supple Neck,No JVD, No cervical lymphadenopathy appriciated.  Symmetrical Chest wall movement, Good air movement bilaterally, CTAB RRR,No Gallops, Rubs or new Murmurs, No Parasternal Heave +ve B.Sounds, Abd Soft, No tenderness, No organomegaly appriciated, No rebound - guarding or rigidity. No Cyanosis, Clubbing or edema, No new Rash or bruise      Data Review:    CBC Recent Labs  Lab 05/24/18 1111 05/24/18 1134 05/25/18 0312 05/26/18 0325  WBC 4.2  --  4.6 3.5*  HGB 12.0 12.6 10.7* 10.8*  HCT 39.8 37.0 35.1* 35.2*  PLT 138*  --  107* 124*  MCV 107.0*  --  104.5* 103.8*  MCH 32.3  --  31.8 31.9  MCHC 30.2  --  30.5 30.7  RDW 14.2  --  13.9 14.0  LYMPHSABS 0.6*  --   --   --   MONOABS 0.7  --   --   --   EOSABS 0.1  --   --   --   BASOSABS 0.0  --   --   --     Chemistries  Recent Labs  Lab 05/24/18 1111 05/24/18 1134 05/25/18 0312 05/26/18 0325  NA 141 141 141 139  K 3.4* 3.4* 3.3* 3.4*  CL 108 108 111 108  CO2 24  --  24 25  GLUCOSE 103* 97 71 82  BUN 16 22 13 15   CREATININE 0.68 0.60 0.63 0.71  CALCIUM 8.5*  --  8.1* 8.6*  MG  --   --   --  2.0  AST 28  --   --   --   ALT 42  --   --   --   ALKPHOS 70  --   --   --   BILITOT 1.0  --   --   --    ------------------------------------------------------------------------------------------------------------------ No results for input(s): CHOL, HDL, LDLCALC, TRIG, CHOLHDL, LDLDIRECT in the last 72 hours.  Lab Results  Component Value Date    HGBA1C 5.3 05/25/2018   ------------------------------------------------------------------------------------------------------------------ Recent Labs    05/25/18 1125  TSH 0.571   ------------------------------------------------------------------------------------------------------------------ Recent Labs    05/25/18 1125  VITAMINB12 935*    Coagulation profile No results for input(s): INR, PROTIME in the last 168 hours.  No results for input(s): DDIMER in the last 72  hours.  Cardiac Enzymes No results for input(s): CKMB, TROPONINI, MYOGLOBIN in the last 168 hours.  Invalid input(s): CK ------------------------------------------------------------------------------------------------------------------ No results found for: BNP  Micro Results Recent Results (from the past 240 hour(s))  Urine culture     Status: None   Collection Time: 05/24/18 11:17 AM  Result Value Ref Range Status   Specimen Description IN/OUT CATH URINE  Final   Special Requests NONE  Final   Culture   Final    NO GROWTH Performed at [facility], [address], [place], [place] [address]    Report Status 05/25/2018 FINAL  Final  MRSA PCR Screening     Status: None   Collection Time: 05/24/18  4:50 PM  Result Value Ref Range Status   MRSA by PCR NEGATIVE NEGATIVE Final    Comment:        The GeneXpert MRSA Assay (FDA approved for NASAL specimens only), is one component of a comprehensive MRSA colonization surveillance program. It is not intended to diagnose MRSA infection nor to guide or monitor treatment for MRSA infections. Performed at [facility], [place] [address], [place], [place] [address]     Radiology Reports Ct Head Wo Contrast  Result Date: 05/24/2018 CLINICAL DATA:  72 year old female found down at home. Unresponsive. Initial encounter. EXAM: CT HEAD WITHOUT CONTRAST TECHNIQUE: Contiguous axial images were obtained from the base of the skull through the vertex  without intravenous contrast. COMPARISON:  01/31/2008 head CT. FINDINGS: Brain: Significant streak artifact through posterior fossa. Taking this limitation into account, no intracranial hemorrhage or CT evidence of large acute infarct noted. Mild global atrophy most notable parietal lobes. No intracranial mass lesion detected on this unenhanced exam. Vascular: No acute hyperdense vessel. Atherosclerotic changes cavernous segment carotid arteries. Skull: No skull fracture. Sinuses/Orbits: No acute orbital abnormality. Visualized paranasal sinuses, mastoid air cells and middle ear cavities are clear. Other: Nasal trumpet is in place. IMPRESSION: 1. Significant streak artifact through posterior fossa. Taking this limitation into account, no intracranial hemorrhage or CT evidence of large acute infarct noted. 2. Mild global atrophy most notable parietal lobes. Electronically Signed   By: Genia Del M.D.   On: 05/24/2018 11:45   Mr Brain Wo Contrast  Result Date: 05/25/2018 CLINICAL DATA:  Altered mental status EXAM: MRI HEAD WITHOUT CONTRAST TECHNIQUE: Multiplanar, multiecho pulse sequences of the brain and surrounding structures were obtained without intravenous contrast. COMPARISON:  None. FINDINGS: BRAIN: There is no acute infarct, acute hemorrhage, hydrocephalus or extra-axial collection. The midline structures are normal. No midline shift or other mass effect. There are no old infarcts. Multifocal white matter hyperintensity, most commonly due to chronic ischemic microangiopathy. The cerebral and cerebellar volume are age-appropriate. Susceptibility-sensitive sequences show no chronic microhemorrhage or superficial siderosis. VASCULAR: Major intracranial arterial and venous sinus flow voids are normal. SKULL AND UPPER CERVICAL SPINE: Calvarial bone marrow signal is normal. There is no skull base mass. Visualized upper cervical spine and soft tissues are normal. SINUSES/ORBITS: No fluid levels or advanced  mucosal thickening. No mastoid or middle ear effusion. The orbits are normal. IMPRESSION: Chronic microvascular disease without acute intracranial abnormality. Electronically Signed   By: Ulyses Jarred M.D.   On: 05/25/2018 19:02   Dg Chest Port 1 View  Result Date: 05/24/2018 CLINICAL DATA:  Altered mental status EXAM: PORTABLE CHEST 1 VIEW COMPARISON:  12/11/2016 FINDINGS: Low lung volumes with bibasilar atelectasis. Heart is upper limits normal in size. No effusions or acute bony abnormality. IMPRESSION:  Low lung volumes, bibasilar atelectasis. Electronically Signed   By: Rolm Baptise M.D.   On: 05/24/2018 11:52    Time Spent in minutes  30   Lala Lund M.D on 05/26/2018 at 10:33 AM  To page go to [email or web address] - password [facility]

## 2018-05-27 DIAGNOSIS — M069 Rheumatoid arthritis, unspecified: Secondary | ICD-10-CM | POA: Diagnosis not present

## 2018-05-27 DIAGNOSIS — E876 Hypokalemia: Secondary | ICD-10-CM | POA: Diagnosis not present

## 2018-05-27 DIAGNOSIS — R4182 Altered mental status, unspecified: Secondary | ICD-10-CM | POA: Diagnosis not present

## 2018-05-27 DIAGNOSIS — R40241 Glasgow coma scale score 13-15, unspecified time: Secondary | ICD-10-CM | POA: Diagnosis not present

## 2018-05-27 DIAGNOSIS — F329 Major depressive disorder, single episode, unspecified: Secondary | ICD-10-CM | POA: Diagnosis not present

## 2018-05-27 DIAGNOSIS — F333 Major depressive disorder, recurrent, severe with psychotic symptoms: Secondary | ICD-10-CM | POA: Diagnosis not present

## 2018-05-27 DIAGNOSIS — E119 Type 2 diabetes mellitus without complications: Secondary | ICD-10-CM | POA: Diagnosis not present

## 2018-05-27 DIAGNOSIS — E785 Hyperlipidemia, unspecified: Secondary | ICD-10-CM | POA: Diagnosis not present

## 2018-05-27 DIAGNOSIS — I10 Essential (primary) hypertension: Secondary | ICD-10-CM | POA: Diagnosis not present

## 2018-05-27 LAB — GLUCOSE, CAPILLARY
Glucose-Capillary: 97 mg/dL (ref 70–99)
Glucose-Capillary: 104 mg/dL — ABNORMAL HIGH (ref 70–99)
Glucose-Capillary: 195 mg/dL — ABNORMAL HIGH (ref 70–99)

## 2018-05-27 NOTE — Progress Notes (Signed)
[facility] unable to accept patient due to being more appropriate for [facility] facility. [facility] and [facility] are reviewing referral.   Cedric Fishman LCSW [phone]

## 2018-05-27 NOTE — Progress Notes (Signed)
Entered patient room to give morning medications and perform an assessment. Patient refused all medications. Patient was educated on importance of medications and encouraged to take her routine medications. She still refused. Patient refused physical assessment. Patient would not let me auscultate lung or heart sound or palpate pulses. Each attempt to approach the patient she stated "no" with hand raised and for reason why stated she "just don't want to".

## 2018-05-27 NOTE — Progress Notes (Signed)
CSW received referral for inpatient psych placement. CSW sent referrals to [facility], [facility], [facility], and [facility].   Percell Locus Tieisha Darden LCSW [phone]

## 2018-05-27 NOTE — Progress Notes (Signed)
PROGRESS NOTE                                                                                                                                                                                                             Patient Demographics:    [name], is a 72 y.o. female, DOB - 08-12-1945, URK:[id number]  Admit date - 05/24/2018   Admitting Physician Karmen Bongo, MD  Outpatient Primary MD for the patient is Leighton Ruff, MD  LOS - 0      Brief Narrative  [name] is a 72 y.o. female with medical history significant of hypothyroidism; HTN; HLD; and DM presenting with AMS. Her sister provided the history, as the patient is obtunded.  She reports that a friend was visiting the patient and that afterwards the friend reported to the nursing staff that she was "not acting quite right."  The nursing staff went to check on the patient and found her unresponsive.  The facility called the sister about 11AM to report that the patient was being transported to [facility].  Since the sister and her husband's arrival, the patient has been sleeping and unresponsive; she "doesn't usually sleep well" so this is unusual for her.  The sister last spoke to her about a week ago.  The facility did report that she apparently didn't feel well yesterday.  She moved into the facility about 3 weeks ago and had significant anxiety leading up to the move.  However, she has seemed to be happy there recently.     Her family does not believe that she would have intentionally taken her medications wrong, but this could have been an inadvertent medication misadventure.  The patient was started on a new medication for anxiety about 3-4 weeks ago, around the time she moved into the facility.    Subjective:   She is sitting up in recliner denies any headache chest or abdominal pain.  Feels sad but currently not suicidal.   Assessment  & Plan :     AMS/ ? Underlying undiagnosed Dementia with  Pilar Plate delusions - intentional polypharmacy overdose with depressed affect, with attempted suicide.  Her case was discussed with the psychiatrist on 05/26/2018 medication adjustments made she is currently on combination of Lexapro, Klonopin and Risperdal, continue Air cabin crew.  Unremarkable CT head, stable MRI brain, stable B12 TSH and RPR.  Likely cleared await inpatient psych bed as requested by psychiatrist.   Depression - She was last seen at [facility] on 11/4,  see above.   HTN - continue home dose ACE inhibitor, have increased beta-blocker dose for better control will continue to monitor.  HLD - Continue Lipitor  RA  - she is on a large number of DMARD medications - causing her to be chronically immunosuppressed. Resumed - Prednisone, Plaquenil, Arava,. Also on weekly methotrexate and biweekly Humira may resume them on discharge.   Hypokalemia.  Replaced will recheck tomorrow.    Stress related hyperglycemia.  Stable A1c.  Lab Results  Component Value Date   HGBA1C 5.3 05/25/2018     Family Communication  :  none  Code Status :  Full  Disposition Plan  : Psych medically stable for discharge  Consults  :  Psych  Procedures  :    CT head - Non acute  MRI nonacute.  DVT Prophylaxis  :  Lovenox   Lab Results  Component Value Date   PLT 124 (L) 05/26/2018    Diet :  Diet Order            Diet heart healthy/carb modified Room service appropriate? Yes; Fluid consistency: Thin  Diet effective now               Inpatient Medications Scheduled Meds: . aspirin EC  81 mg Oral Daily  . atorvastatin  40 mg Oral Daily  . chlorhexidine  15 mL Mouth Rinse BID  . cholecalciferol  2,000 Units Oral Daily  . clonazePAM  0.5 mg Oral QHS  . cycloSPORINE  1 drop Both Eyes BID  . enoxaparin (LOVENOX) injection  40 mg Subcutaneous Q24H  . escitalopram  5 mg Oral Daily  . feeding supplement (ENSURE ENLIVE)  237 mL Oral BID BM  . fluticasone  1-2 spray Each Nare Daily    . folic acid  1 mg Oral Daily  . hydroxychloroquine  400 mg Oral Daily  . leflunomide  20 mg Oral Daily  . lisinopril  10 mg Oral Daily  . mouth rinse  15 mL Mouth Rinse q12n4p  . metoprolol tartrate  50 mg Oral BID  . multivitamin with minerals  1 tablet Oral Daily  . predniSONE  5 mg Oral Q breakfast  . risperiDONE  0.5 mg Oral QHS  . vitamin B-12  1,000 mcg Oral Daily   Continuous Infusions:  PRN Meds:.acetaminophen, LORazepam, [DISCONTINUED] ondansetron **OR** ondansetron (ZOFRAN) IV  Antibiotics  :   Anti-infectives (From admission, onward)   Start     Dose/Rate Route Frequency Ordered Stop   05/25/18 1000  hydroxychloroquine (PLAQUENIL) tablet 400 mg     400 mg Oral Daily 05/24/18 1405     05/25/18 0000  ceFEPIme (MAXIPIME) 2 g in sodium chloride 0.9 % 100 mL IVPB  Status:  Discontinued     2 g 200 mL/hr over 30 Minutes Intravenous Every 12 hours 05/24/18 1302 05/24/18 1405   05/25/18 0000  vancomycin (VANCOCIN) IVPB 750 mg/150 ml premix  Status:  Discontinued     750 mg 150 mL/hr over 60 Minutes Intravenous Every 12 hours 05/24/18 1302 05/24/18 1405   05/24/18 1145  ceFEPIme (MAXIPIME) 2 g in sodium chloride 0.9 % 100 mL IVPB     2 g 200 mL/hr over 30 Minutes Intravenous  Once 05/24/18 1138 05/24/18 1221   05/24/18 1145  metroNIDAZOLE (FLAGYL) IVPB 500 mg  Status:  Discontinued     500 mg 100 mL/hr over 60 Minutes Intravenous Every 8 hours 05/24/18 1138 05/24/18 1405   05/24/18 1145  vancomycin (VANCOCIN)  IVPB 1000 mg/200 mL premix     1,000 mg 200 mL/hr over 60 Minutes Intravenous  Once 05/24/18 1138 05/24/18 1531          Objective:   Vitals:   05/26/18 1029 05/26/18 1354 05/26/18 2134 05/27/18 0543  BP: 140/71 (!) 112/42 (!) 149/92 (!) 147/78  Pulse: 85 83 83 81  Resp:  18 16 18   Temp:  98.8 F (37.1 C) 98 F (36.7 C) 97.9 F (36.6 C)  TempSrc:  Oral    SpO2:  94% 97% 99%  Weight:      Height:        Wt Readings from Last 3 Encounters:  05/24/18  70.3 kg  10/07/16 74.8 kg  05/13/16 73.5 kg     Intake/Output Summary (Last 24 hours) at 05/27/2018 1042 Last data filed at 05/26/2018 2033 Gross per 24 hour  Intake 560 ml  Output -  Net 560 ml     Physical Exam  Awake Alert, Oriented X 3, No new F.N deficits, flat affect [place].AT,PERRAL Supple Neck,No JVD, No cervical lymphadenopathy appriciated.  Symmetrical Chest wall movement, Good air movement bilaterally, CTAB RRR,No Gallops, Rubs or new Murmurs, No Parasternal Heave +ve B.Sounds, Abd Soft, No tenderness, No organomegaly appriciated, No rebound - guarding or rigidity. No Cyanosis, Clubbing or edema, No new Rash or bruise    Data Review:    CBC Recent Labs  Lab 05/24/18 1111 05/24/18 1134 05/25/18 0312 05/25/18 1125 05/26/18 0325  WBC 4.2  --  4.6  --  3.5*  HGB 12.0 12.6 10.7*  --  10.8*  HCT 39.8 37.0 35.1* 34.7 35.2*  PLT 138*  --  107*  --  124*  MCV 107.0*  --  104.5*  --  103.8*  MCH 32.3  --  31.8  --  31.9  MCHC 30.2  --  30.5  --  30.7  RDW 14.2  --  13.9  --  14.0  LYMPHSABS 0.6*  --   --   --   --   MONOABS 0.7  --   --   --   --   EOSABS 0.1  --   --   --   --   BASOSABS 0.0  --   --   --   --     Chemistries  Recent Labs  Lab 05/24/18 1111 05/24/18 1134 05/25/18 0312 05/26/18 0325  NA 141 141 141 139  K 3.4* 3.4* 3.3* 3.4*  CL 108 108 111 108  CO2 24  --  24 25  GLUCOSE 103* 97 71 82  BUN 16 22 13 15   CREATININE 0.68 0.60 0.63 0.71  CALCIUM 8.5*  --  8.1* 8.6*  MG  --   --   --  2.0  AST 28  --   --   --   ALT 42  --   --   --   ALKPHOS 70  --   --   --   BILITOT 1.0  --   --   --    ------------------------------------------------------------------------------------------------------------------ No results for input(s): CHOL, HDL, LDLCALC, TRIG, CHOLHDL, LDLDIRECT in the last 72 hours.  Lab Results  Component Value Date   HGBA1C 5.3 05/25/2018    ------------------------------------------------------------------------------------------------------------------ Recent Labs    05/25/18 1125  TSH 0.571   ------------------------------------------------------------------------------------------------------------------ Recent Labs    05/25/18 1125  VITAMINB12 935*    Coagulation profile No results for input(s): INR, PROTIME in the last 168 hours.  No  results for input(s): DDIMER in the last 72 hours.  Cardiac Enzymes No results for input(s): CKMB, TROPONINI, MYOGLOBIN in the last 168 hours.  Invalid input(s): CK ------------------------------------------------------------------------------------------------------------------ No results found for: BNP  Micro Results Recent Results (from the past 240 hour(s))  Urine culture     Status: None   Collection Time: 05/24/18 11:17 AM  Result Value Ref Range Status   Specimen Description IN/OUT CATH URINE  Final   Special Requests NONE  Final   Culture   Final    NO GROWTH Performed at [facility], [address] [place], [place] [address]    Report Status 05/25/2018 FINAL  Final  MRSA PCR Screening     Status: None   Collection Time: 05/24/18  4:50 PM  Result Value Ref Range Status   MRSA by PCR NEGATIVE NEGATIVE Final    Comment:        The GeneXpert MRSA Assay (FDA approved for NASAL specimens only), is one component of a comprehensive MRSA colonization surveillance program. It is not intended to diagnose MRSA infection nor to guide or monitor treatment for MRSA infections. Performed at [facility], [place] [address], [place], [place] [address]     Radiology Reports Ct Head Wo Contrast  Result Date: 05/24/2018 CLINICAL DATA:  72 year old female found down at home. Unresponsive. Initial encounter. EXAM: CT HEAD WITHOUT CONTRAST TECHNIQUE: Contiguous axial images were obtained from the base of the skull through the vertex without intravenous  contrast. COMPARISON:  01/31/2008 head CT. FINDINGS: Brain: Significant streak artifact through posterior fossa. Taking this limitation into account, no intracranial hemorrhage or CT evidence of large acute infarct noted. Mild global atrophy most notable parietal lobes. No intracranial mass lesion detected on this unenhanced exam. Vascular: No acute hyperdense vessel. Atherosclerotic changes cavernous segment carotid arteries. Skull: No skull fracture. Sinuses/Orbits: No acute orbital abnormality. Visualized paranasal sinuses, mastoid air cells and middle ear cavities are clear. Other: Nasal trumpet is in place. IMPRESSION: 1. Significant streak artifact through posterior fossa. Taking this limitation into account, no intracranial hemorrhage or CT evidence of large acute infarct noted. 2. Mild global atrophy most notable parietal lobes. Electronically Signed   By: Genia Del M.D.   On: 05/24/2018 11:45   Mr Brain Wo Contrast  Result Date: 05/25/2018 CLINICAL DATA:  Altered mental status EXAM: MRI HEAD WITHOUT CONTRAST TECHNIQUE: Multiplanar, multiecho pulse sequences of the brain and surrounding structures were obtained without intravenous contrast. COMPARISON:  None. FINDINGS: BRAIN: There is no acute infarct, acute hemorrhage, hydrocephalus or extra-axial collection. The midline structures are normal. No midline shift or other mass effect. There are no old infarcts. Multifocal white matter hyperintensity, most commonly due to chronic ischemic microangiopathy. The cerebral and cerebellar volume are age-appropriate. Susceptibility-sensitive sequences show no chronic microhemorrhage or superficial siderosis. VASCULAR: Major intracranial arterial and venous sinus flow voids are normal. SKULL AND UPPER CERVICAL SPINE: Calvarial bone marrow signal is normal. There is no skull base mass. Visualized upper cervical spine and soft tissues are normal. SINUSES/ORBITS: No fluid levels or advanced mucosal thickening. No  mastoid or middle ear effusion. The orbits are normal. IMPRESSION: Chronic microvascular disease without acute intracranial abnormality. Electronically Signed   By: Ulyses Jarred M.D.   On: 05/25/2018 19:02   Dg Chest Port 1 View  Result Date: 05/24/2018 CLINICAL DATA:  Altered mental status EXAM: PORTABLE CHEST 1 VIEW COMPARISON:  12/11/2016 FINDINGS: Low lung volumes with bibasilar atelectasis. Heart is upper limits normal in  size. No effusions or acute bony abnormality. IMPRESSION: Low lung volumes, bibasilar atelectasis. Electronically Signed   By: Rolm Baptise M.D.   On: 05/24/2018 11:52    Time Spent in minutes  30   Lala Lund M.D on 05/27/2018 at 10:41 AM  To page go to [email or web address] - password [facility]

## 2018-05-27 NOTE — Progress Notes (Signed)
Physical Therapy Treatment Patient Details Name: [name] MRN: [id number] DOB: 04-16-1946 Today's Date: 05/27/2018    History of Present Illness [name] is a 72 y.o. female with medical history significant of hypothyroidism; HTN; HLD; and DM presenting with AMS.    PT Comments    Patient progressing with stability & endurance for mobility this session.  She remains high fall risk due to decreased safety awareness and decreased deficit awareness.  She also continues to need monitoring due to psychiatric concerns and agree with inpatient psych admission.  PT to follow acutely to maximize mobility and safety.  Follow Up Recommendations  Other (comment)(inpatient psychiatric hospital)     Equipment Recommendations  Rolling walker with 5" wheels    Recommendations for Other Services       Precautions / Restrictions Precautions Precautions: Fall;Other (comment)    Mobility  Bed Mobility               General bed mobility comments: up in chair  Transfers Overall transfer level: Needs assistance Equipment used: None Transfers: Sit to/from Stand Sit to Stand: Min assist         General transfer comment: with armrests and S for safety, no armrests, needs min A due to posterior LOB  Ambulation/Gait Ambulation/Gait assistance: Min guard;Supervision Gait Distance (Feet): 200 Feet Assistive device: None Gait Pattern/deviations: Step-to pattern;Step-through pattern;Decreased stride length;Shuffle     General Gait Details: ambulated no device with min guard at times and pt utilizing wall rail at times, but more limited and slow due to lack of direction despite cues, hesitant and anxious   Stairs             Wheelchair Mobility    Modified Rankin (Stroke Patients Only)       Balance Overall balance assessment: Needs assistance Sitting-balance support: No upper extremity supported Sitting balance-Leahy Scale: Good       Standing  balance-Leahy Scale: Good Standing balance comment: static balance good, with function needs minguard to S for safety due to imbalance               High Level Balance Comments: attempted to engage pt in side stepping and other balance activities, she declined despite encouragement            Cognition Arousal/Alertness: Awake/alert Behavior During Therapy: Anxious;Restless Overall Cognitive Status: Impaired/Different from baseline Area of Impairment: Orientation;Memory;Safety/judgement;Problem solving                 Orientation Level: Situation;Time;Disoriented to   Memory: Decreased short-term memory   Safety/Judgement: Decreased awareness of safety;Decreased awareness of deficits   Problem Solving: Slow processing;Decreased initiation;Requires verbal cues;Requires tactile cues General Comments: Paranoid and anxious, mumbling about "that wasn't me in those pictures".        Exercises Other Exercises Other Exercises: standing heel raises x 10 with minguard A, sit<>stand x 4 with min A due to no UE support    General Comments        Pertinent Vitals/Pain Pain Assessment: No/denies pain    Home Living                      Prior Function            PT Goals (current goals can now be found in the care plan section) Progress towards PT goals: Progressing toward goals    Frequency    Min 3X/week      PT Plan Discharge plan needs  to be updated;Frequency needs to be updated    Co-evaluation              AM-PAC PT "6 Clicks" Daily Activity  Outcome Measure  Difficulty turning over in bed (including adjusting bedclothes, sheets and blankets)?: A Little Difficulty moving from lying on back to sitting on the side of the bed? : A Little Difficulty sitting down on and standing up from a chair with arms (e.g., wheelchair, bedside commode, etc,.)?: Unable Help needed moving to and from a bed to chair (including a wheelchair)?: A  Little Help needed walking in hospital room?: A Little Help needed climbing 3-5 steps with a railing? : A Little 6 Click Score: 16    End of Session Equipment Utilized During Treatment: Gait belt Activity Tolerance: Patient tolerated treatment well Patient left: with call bell/phone within reach;in chair;with nursing/sitter in room   PT Visit Diagnosis: Unsteadiness on feet (R26.81);Muscle weakness (generalized) (M62.81);Difficulty in walking, not elsewhere classified (R26.2)     Time: 7322-0254 PT Time Calculation (min) (ACUTE ONLY): 19 min  Charges:  $Gait Training: 8-22 mins                     Magda Kiel, [place] [phone] 05/27/2018    Reginia Naas 05/27/2018, 12:59 PM

## 2018-05-28 DIAGNOSIS — E785 Hyperlipidemia, unspecified: Secondary | ICD-10-CM | POA: Diagnosis not present

## 2018-05-28 DIAGNOSIS — F329 Major depressive disorder, single episode, unspecified: Secondary | ICD-10-CM | POA: Diagnosis not present

## 2018-05-28 DIAGNOSIS — R4182 Altered mental status, unspecified: Secondary | ICD-10-CM | POA: Diagnosis not present

## 2018-05-28 DIAGNOSIS — M069 Rheumatoid arthritis, unspecified: Secondary | ICD-10-CM | POA: Diagnosis not present

## 2018-05-28 DIAGNOSIS — R40241 Glasgow coma scale score 13-15, unspecified time: Secondary | ICD-10-CM | POA: Diagnosis not present

## 2018-05-28 DIAGNOSIS — E876 Hypokalemia: Secondary | ICD-10-CM | POA: Diagnosis not present

## 2018-05-28 DIAGNOSIS — F333 Major depressive disorder, recurrent, severe with psychotic symptoms: Secondary | ICD-10-CM | POA: Diagnosis not present

## 2018-05-28 DIAGNOSIS — E119 Type 2 diabetes mellitus without complications: Secondary | ICD-10-CM | POA: Diagnosis not present

## 2018-05-28 DIAGNOSIS — I10 Essential (primary) hypertension: Secondary | ICD-10-CM | POA: Diagnosis not present

## 2018-05-28 MED ORDER — METOPROLOL TARTRATE 100 MG PO TABS
100.0000 mg | ORAL_TABLET | Freq: Two times a day (BID) | ORAL | Status: DC
  Administered 2018-05-30 – 2018-06-02 (×8): 100 mg via ORAL
  Filled 2018-05-28 (×9): qty 1

## 2018-05-28 MED ORDER — LORAZEPAM 2 MG/ML IJ SOLN
1.0000 mg | Freq: Once | INTRAMUSCULAR | Status: AC
  Administered 2018-05-28: 1 mg via INTRAVENOUS
  Filled 2018-05-28: qty 1

## 2018-05-28 NOTE — Progress Notes (Addendum)
Patient refused  scheduled klonopin and requip but took Metoprolol 50 mg . She reported that they aren't the same meds she had been taking.  Pt encouraged  by this  Nurse and  charge Nurse,  Minette Headland but pt remained adamant. Pt pulled IV access and noted to be agitated  later in the shift. Ativan 0.5mg  one time order given with relief

## 2018-05-28 NOTE — Progress Notes (Signed)
CSW faxed clinicals to [facility], they report no beds avail at this time but will contact CSW when one does become available.   [place], [place]

## 2018-05-28 NOTE — Progress Notes (Signed)
CSW followed up with [facility] who reports patient can be put on wait list however they have no current beds avail or anticipated discharges.   [place], [place]

## 2018-05-28 NOTE — Care Management Note (Signed)
Case Management Note  Patient Details  Name: [name] MRN: [id number] Date of Birth: August 22, 1945  Subjective/Objective:  AMS/  polypharmacy overdose   , hx of hypothyroidism; HTN; HLD; and DM. PTA lived alone.  Telford Nab (Sister) Freida Busman Apple (Niece)    [phone]     PCP: Leighton Ruff  Action/Plan: Transition to inpatient psych bed when bed available... CSW managing disposition to facility.  Expected Discharge Date:  05/26/18               Expected Discharge Plan:  IP Rehab Facility  In-House Referral:  Clinical Social Work  Discharge planning Services  CM Consult  Post Acute Care Choice:  NA Choice offered to:  NA  DME Arranged:  N/A(OWNS CANE) DME Agency:  NA  HH Arranged:  NA HH Agency:  NA  Status of Service:  Completed, signed off  If discussed at [employer], dates discussed:    Additional Comments:  Sharin Mons, RN 05/28/2018, 2:01 PM

## 2018-05-28 NOTE — Care Management Obs Status (Signed)
MEDICARE OBSERVATION STATUS NOTIFICATION   Patient Details  Name: [name] MRN: [id number] Date of Birth: 1946/04/22   Medicare Observation Status Notification Given:  Yes    Sharin Mons, RN 05/28/2018, 2:00 PM

## 2018-05-28 NOTE — Progress Notes (Addendum)
PROGRESS NOTE                                                                                                                                                                                                             Patient Demographics:    [name], is a 72 y.o. female, DOB - [birth year], UTM:[id number]  Admit date - 05/24/2018   Admitting Physician Karmen Bongo, MD  Outpatient Primary MD for the patient is Leighton Ruff, MD  LOS - 0      Brief Narrative  [name] is a 72 y.o. female with medical history significant of hypothyroidism; HTN; HLD; and DM presenting with AMS. Her sister provided the history, as the patient is obtunded.  She reports that a friend was visiting the patient and that afterwards the friend reported to the nursing staff that she was "not acting quite right."  The nursing staff went to check on the patient and found her unresponsive.  The facility called the sister about 11AM to report that the patient was being transported to [facility].  Since the sister and her husband's arrival, the patient has been sleeping and unresponsive; she "doesn't usually sleep well" so this is unusual for her.  The sister last spoke to her about a week ago.  The facility did report that she apparently didn't feel well yesterday.  She moved into the facility about 3 weeks ago and had significant anxiety leading up to the move.  However, she has seemed to be happy there recently.     Her family does not believe that she would have intentionally taken her medications wrong, but this could have been an inadvertent medication misadventure.  The patient was started on a new medication for anxiety about 3-4 weeks ago, around the time she moved into the facility.    Subjective:   Patient in bed, appears comfortable, denies any headache, no fever, no chest pain or pressure, no shortness of breath , no abdominal pain. No focal weakness.    Assessment  & Plan :       AMS/ ? Underlying undiagnosed Dementia with Pilar Plate delusions - intentional polypharmacy overdose with depressed affect, with attempted suicide.  Her case was discussed with the psychiatrist on 05/26/2018 medication adjustments made she is currently on combination of Lexapro, Klonopin and Risperdal, continue Air cabin crew.  Unremarkable CT head, stable MRI brain, stable B12 TSH and RPR.  Medically cleared await inpatient psych bed as requested by psychiatrist.   Depression -  She was last seen at [facility] on 11/4, see above.   HTN - continue home dose ACE inhibitor, increase beta-blocker for better control.  HLD - Continue Lipitor  RA  - she is on a large number of DMARD medications - causing her to be chronically immunosuppressed. Resumed - Prednisone, Plaquenil, Arava,. Also on weekly methotrexate and biweekly Humira may resume them on discharge.   Hypokalemia.  Replaced will recheck tomorrow.    Stress related hyperglycemia.  Stable A1c.  Lab Results  Component Value Date   HGBA1C 5.3 05/25/2018     Family Communication  :  none  Code Status :  Full  Disposition Plan  : Psych medically stable for discharge  Consults  :  Psych  Procedures  :    CT head - Non acute  MRI nonacute.  DVT Prophylaxis  :  Lovenox   Lab Results  Component Value Date   PLT 124 (L) 05/26/2018    Diet :  Diet Order            Diet heart healthy/carb modified Room service appropriate? Yes; Fluid consistency: Thin  Diet effective now               Inpatient Medications Scheduled Meds: . aspirin EC  81 mg Oral Daily  . atorvastatin  40 mg Oral Daily  . chlorhexidine  15 mL Mouth Rinse BID  . cholecalciferol  2,000 Units Oral Daily  . clonazePAM  0.5 mg Oral QHS  . cycloSPORINE  1 drop Both Eyes BID  . enoxaparin (LOVENOX) injection  40 mg Subcutaneous Q24H  . escitalopram  5 mg Oral Daily  . feeding supplement (ENSURE ENLIVE)  237 mL Oral BID BM  . fluticasone  1-2 spray Each  Nare Daily  . folic acid  1 mg Oral Daily  . hydroxychloroquine  400 mg Oral Daily  . leflunomide  20 mg Oral Daily  . lisinopril  10 mg Oral Daily  . mouth rinse  15 mL Mouth Rinse q12n4p  . metoprolol tartrate  50 mg Oral BID  . multivitamin with minerals  1 tablet Oral Daily  . predniSONE  5 mg Oral Q breakfast  . risperiDONE  0.5 mg Oral QHS  . vitamin B-12  1,000 mcg Oral Daily   Continuous Infusions:  PRN Meds:.acetaminophen, LORazepam, [DISCONTINUED] ondansetron **OR** ondansetron (ZOFRAN) IV  Antibiotics  :   Anti-infectives (From admission, onward)   Start     Dose/Rate Route Frequency Ordered Stop   05/25/18 1000  hydroxychloroquine (PLAQUENIL) tablet 400 mg     400 mg Oral Daily 05/24/18 1405     05/25/18 0000  ceFEPIme (MAXIPIME) 2 g in sodium chloride 0.9 % 100 mL IVPB  Status:  Discontinued     2 g 200 mL/hr over 30 Minutes Intravenous Every 12 hours 05/24/18 1302 05/24/18 1405   05/25/18 0000  vancomycin (VANCOCIN) IVPB 750 mg/150 ml premix  Status:  Discontinued     750 mg 150 mL/hr over 60 Minutes Intravenous Every 12 hours 05/24/18 1302 05/24/18 1405   05/24/18 1145  ceFEPIme (MAXIPIME) 2 g in sodium chloride 0.9 % 100 mL IVPB     2 g 200 mL/hr over 30 Minutes Intravenous  Once 05/24/18 1138 05/24/18 1221   05/24/18 1145  metroNIDAZOLE (FLAGYL) IVPB 500 mg  Status:  Discontinued     500 mg 100 mL/hr over 60 Minutes Intravenous Every 8 hours 05/24/18 1138 05/24/18 1405   05/24/18 1145  vancomycin (  VANCOCIN) IVPB 1000 mg/200 mL premix     1,000 mg 200 mL/hr over 60 Minutes Intravenous  Once 05/24/18 1138 05/24/18 1531          Objective:   Vitals:   05/27/18 1111 05/27/18 1306 05/27/18 2134 05/28/18 0529  BP: (!) 156/75 (!) 147/68 (!) 150/71 (!) 155/84  Pulse: 92 79 89 (!) 113  Resp: 18 19 18 17   Temp: 98.7 F (37.1 C) 98.9 F (37.2 C) 98.3 F (36.8 C) 98.6 F (37 C)  TempSrc: Oral Oral Oral Oral  SpO2: 98% 97% 98% 98%  Weight:      Height:          Wt Readings from Last 3 Encounters:  05/24/18 70.3 kg  10/07/16 74.8 kg  05/13/16 73.5 kg     Intake/Output Summary (Last 24 hours) at 05/28/2018 1104 Last data filed at 05/28/2018 0900 Gross per 24 hour  Intake 350 ml  Output -  Net 350 ml     Physical Exam  Awake Alert, Oriented X 3, No new F.N deficits, flat affect [place].AT,PERRAL Supple Neck,No JVD, No cervical lymphadenopathy appriciated.  Symmetrical Chest wall movement, Good air movement bilaterally, CTAB RRR,No Gallops, Rubs or new Murmurs, No Parasternal Heave +ve B.Sounds, Abd Soft, No tenderness, No organomegaly appriciated, No rebound - guarding or rigidity. No Cyanosis, Clubbing or edema, No new Rash or bruise    Data Review:    CBC Recent Labs  Lab 05/24/18 1111 05/24/18 1134 05/25/18 0312 05/25/18 1125 05/26/18 0325  WBC 4.2  --  4.6  --  3.5*  HGB 12.0 12.6 10.7*  --  10.8*  HCT 39.8 37.0 35.1* 34.7 35.2*  PLT 138*  --  107*  --  124*  MCV 107.0*  --  104.5*  --  103.8*  MCH 32.3  --  31.8  --  31.9  MCHC 30.2  --  30.5  --  30.7  RDW 14.2  --  13.9  --  14.0  LYMPHSABS 0.6*  --   --   --   --   MONOABS 0.7  --   --   --   --   EOSABS 0.1  --   --   --   --   BASOSABS 0.0  --   --   --   --     Chemistries  Recent Labs  Lab 05/24/18 1111 05/24/18 1134 05/25/18 0312 05/26/18 0325  NA 141 141 141 139  K 3.4* 3.4* 3.3* 3.4*  CL 108 108 111 108  CO2 24  --  24 25  GLUCOSE 103* 97 71 82  BUN 16 22 13 15   CREATININE 0.68 0.60 0.63 0.71  CALCIUM 8.5*  --  8.1* 8.6*  MG  --   --   --  2.0  AST 28  --   --   --   ALT 42  --   --   --   ALKPHOS 70  --   --   --   BILITOT 1.0  --   --   --    ------------------------------------------------------------------------------------------------------------------ No results for input(s): CHOL, HDL, LDLCALC, TRIG, CHOLHDL, LDLDIRECT in the last 72 hours.  Lab Results  Component Value Date   HGBA1C 5.3 05/25/2018    ------------------------------------------------------------------------------------------------------------------ Recent Labs    05/25/18 1125  TSH 0.571   ------------------------------------------------------------------------------------------------------------------ Recent Labs    05/25/18 1125  VITAMINB12 935*    Coagulation profile No results for input(s): INR, PROTIME  in the last 168 hours.  No results for input(s): DDIMER in the last 72 hours.  Cardiac Enzymes No results for input(s): CKMB, TROPONINI, MYOGLOBIN in the last 168 hours.  Invalid input(s): CK ------------------------------------------------------------------------------------------------------------------ No results found for: BNP  Micro Results Recent Results (from the past 240 hour(s))  Urine culture     Status: None   Collection Time: 05/24/18 11:17 AM  Result Value Ref Range Status   Specimen Description IN/OUT CATH URINE  Final   Special Requests NONE  Final   Culture   Final    NO GROWTH Performed at [facility], [address] [place], [place] [address]    Report Status 05/25/2018 FINAL  Final  MRSA PCR Screening     Status: None   Collection Time: 05/24/18  4:50 PM  Result Value Ref Range Status   MRSA by PCR NEGATIVE NEGATIVE Final    Comment:        The GeneXpert MRSA Assay (FDA approved for NASAL specimens only), is one component of a comprehensive MRSA colonization surveillance program. It is not intended to diagnose MRSA infection nor to guide or monitor treatment for MRSA infections. Performed at [facility], [place] [address], [place], [place] [address]     Radiology Reports Ct Head Wo Contrast  Result Date: 05/24/2018 CLINICAL DATA:  72 year old female found down at home. Unresponsive. Initial encounter. EXAM: CT HEAD WITHOUT CONTRAST TECHNIQUE: Contiguous axial images were obtained from the base of the skull through the vertex without intravenous  contrast. COMPARISON:  01/31/2008 head CT. FINDINGS: Brain: Significant streak artifact through posterior fossa. Taking this limitation into account, no intracranial hemorrhage or CT evidence of large acute infarct noted. Mild global atrophy most notable parietal lobes. No intracranial mass lesion detected on this unenhanced exam. Vascular: No acute hyperdense vessel. Atherosclerotic changes cavernous segment carotid arteries. Skull: No skull fracture. Sinuses/Orbits: No acute orbital abnormality. Visualized paranasal sinuses, mastoid air cells and middle ear cavities are clear. Other: Nasal trumpet is in place. IMPRESSION: 1. Significant streak artifact through posterior fossa. Taking this limitation into account, no intracranial hemorrhage or CT evidence of large acute infarct noted. 2. Mild global atrophy most notable parietal lobes. Electronically Signed   By: Genia Del M.D.   On: 05/24/2018 11:45   Mr Brain Wo Contrast  Result Date: 05/25/2018 CLINICAL DATA:  Altered mental status EXAM: MRI HEAD WITHOUT CONTRAST TECHNIQUE: Multiplanar, multiecho pulse sequences of the brain and surrounding structures were obtained without intravenous contrast. COMPARISON:  None. FINDINGS: BRAIN: There is no acute infarct, acute hemorrhage, hydrocephalus or extra-axial collection. The midline structures are normal. No midline shift or other mass effect. There are no old infarcts. Multifocal white matter hyperintensity, most commonly due to chronic ischemic microangiopathy. The cerebral and cerebellar volume are age-appropriate. Susceptibility-sensitive sequences show no chronic microhemorrhage or superficial siderosis. VASCULAR: Major intracranial arterial and venous sinus flow voids are normal. SKULL AND UPPER CERVICAL SPINE: Calvarial bone marrow signal is normal. There is no skull base mass. Visualized upper cervical spine and soft tissues are normal. SINUSES/ORBITS: No fluid levels or advanced mucosal thickening. No  mastoid or middle ear effusion. The orbits are normal. IMPRESSION: Chronic microvascular disease without acute intracranial abnormality. Electronically Signed   By: Ulyses Jarred M.D.   On: 05/25/2018 19:02   Dg Chest Port 1 View  Result Date: 05/24/2018 CLINICAL DATA:  Altered mental status EXAM: PORTABLE CHEST 1 VIEW COMPARISON:  12/11/2016 FINDINGS: Low lung volumes with bibasilar  atelectasis. Heart is upper limits normal in size. No effusions or acute bony abnormality. IMPRESSION: Low lung volumes, bibasilar atelectasis. Electronically Signed   By: Rolm Baptise M.D.   On: 05/24/2018 11:52    Time Spent in minutes  30   Lala Lund M.D on 05/28/2018 at 11:04 AM  To page go to [email or web address] - password [facility]

## 2018-05-28 NOTE — Progress Notes (Signed)
CSW reached out to [facility] and [facility] to follow up on referrals sent yesterday by floor CSW.   [facility] was unable to be reached and no voicemail available to leave message.   [facility] reports they did not receive a referral yesterday and requested CSW fax clinicals. CSW faxing clinicals and will continue to follow up.   [place], [place]

## 2018-05-29 DIAGNOSIS — M069 Rheumatoid arthritis, unspecified: Secondary | ICD-10-CM | POA: Diagnosis not present

## 2018-05-29 DIAGNOSIS — F329 Major depressive disorder, single episode, unspecified: Secondary | ICD-10-CM | POA: Diagnosis not present

## 2018-05-29 DIAGNOSIS — R4182 Altered mental status, unspecified: Secondary | ICD-10-CM | POA: Diagnosis not present

## 2018-05-29 DIAGNOSIS — E785 Hyperlipidemia, unspecified: Secondary | ICD-10-CM | POA: Diagnosis not present

## 2018-05-29 DIAGNOSIS — R40241 Glasgow coma scale score 13-15, unspecified time: Secondary | ICD-10-CM | POA: Diagnosis not present

## 2018-05-29 DIAGNOSIS — I10 Essential (primary) hypertension: Secondary | ICD-10-CM | POA: Diagnosis not present

## 2018-05-29 DIAGNOSIS — E119 Type 2 diabetes mellitus without complications: Secondary | ICD-10-CM | POA: Diagnosis not present

## 2018-05-29 DIAGNOSIS — E876 Hypokalemia: Secondary | ICD-10-CM | POA: Diagnosis not present

## 2018-05-29 DIAGNOSIS — F333 Major depressive disorder, recurrent, severe with psychotic symptoms: Secondary | ICD-10-CM | POA: Diagnosis not present

## 2018-05-29 LAB — POTASSIUM: Potassium: 3.9 mmol/L (ref 3.5–5.1)

## 2018-05-29 MED ORDER — FUROSEMIDE 40 MG PO TABS
40.0000 mg | ORAL_TABLET | Freq: Once | ORAL | Status: AC
  Administered 2018-05-29: 40 mg via ORAL
  Filled 2018-05-29: qty 1

## 2018-05-29 MED ORDER — PRO-STAT SUGAR FREE PO LIQD
30.0000 mL | Freq: Three times a day (TID) | ORAL | Status: DC
  Administered 2018-05-29 – 2018-06-02 (×7): 30 mL via ORAL
  Filled 2018-05-29 (×8): qty 30

## 2018-05-29 MED ORDER — POTASSIUM CHLORIDE CRYS ER 20 MEQ PO TBCR
20.0000 meq | EXTENDED_RELEASE_TABLET | Freq: Once | ORAL | Status: AC
  Administered 2018-05-29: 20 meq via ORAL
  Filled 2018-05-29: qty 1

## 2018-05-29 NOTE — Progress Notes (Signed)
PROGRESS NOTE                                                                                                                                                                                                             Patient Demographics:    [name], is a 72 y.o. female, DOB - 1945/10/20, JXB:[id number]  Admit date - 05/24/2018   Admitting Physician Karmen Bongo, MD  Outpatient Primary MD for the patient is Leighton Ruff, MD  LOS - 0      Brief Narrative  [name] is a 72 y.o. female with medical history significant of hypothyroidism; HTN; HLD; and DM presenting with AMS. Her sister provided the history, as the patient is obtunded.  She reports that a friend was visiting the patient and that afterwards the friend reported to the nursing staff that she was "not acting quite right."  The nursing staff went to check on the patient and found her unresponsive.  The facility called the sister about 11AM to report that the patient was being transported to [facility].  Since the sister and her husband's arrival, the patient has been sleeping and unresponsive; she "doesn't usually sleep well" so this is unusual for her.  The sister last spoke to her about a week ago.  The facility did report that she apparently didn't feel well yesterday.  She moved into the facility about 3 weeks ago and had significant anxiety leading up to the move.  However, she has seemed to be happy there recently.     Her family does not believe that she would have intentionally taken her medications wrong, but this could have been an inadvertent medication misadventure.  The patient was started on a new medication for anxiety about 3-4 weeks ago, around the time she moved into the facility.    Subjective:   Patient in bed, appears comfortable, denies any headache, no fever, no chest pain or pressure, no shortness of breath , no abdominal pain. No focal weakness.    Assessment  & Plan :       AMS/ ? Underlying undiagnosed Dementia with Pilar Plate delusions - intentional polypharmacy overdose with depressed affect, with attempted suicide.  Her case was discussed with the psychiatrist on 05/26/2018 medication adjustments made she is currently on combination of Lexapro, Klonopin and Risperdal, continue Air cabin crew.  Unremarkable CT head, stable MRI brain, stable B12 TSH and RPR.  Medically cleared await inpatient psych bed as requested by psychiatrist.   Depression -  She was last seen at [facility] on 11/4, see above.   HTN - continue home dose ACE inhibitor, increase beta-blocker for better control.  HLD - Continue Lipitor  RA  - she is on a large number of DMARD medications - causing her to be chronically immunosuppressed. Resumed - Prednisone, Plaquenil, Arava,. Also on weekly methotrexate and biweekly Humira may resume them on discharge.   Hypokalemia.  Replaced will recheck tomorrow.    Mild edema.  Placed on TED stockings and Lasix, frequently refusing medications.    Stress related hyperglycemia.  Stable A1c.  Lab Results  Component Value Date   HGBA1C 5.3 05/25/2018     Family Communication  :  none  Code Status :  Full  Disposition Plan  : Psych medically stable for discharge  Consults  :  Psych  Procedures  :    CT head - Non acute  MRI nonacute.  DVT Prophylaxis  :  Lovenox   Lab Results  Component Value Date   PLT 124 (L) 05/26/2018    Diet :  Diet Order            Diet heart healthy/carb modified Room service appropriate? Yes; Fluid consistency: Thin  Diet effective now               Inpatient Medications Scheduled Meds: . aspirin EC  81 mg Oral Daily  . atorvastatin  40 mg Oral Daily  . chlorhexidine  15 mL Mouth Rinse BID  . cholecalciferol  2,000 Units Oral Daily  . clonazePAM  0.5 mg Oral QHS  . cycloSPORINE  1 drop Both Eyes BID  . enoxaparin (LOVENOX) injection  40 mg Subcutaneous Q24H  . escitalopram  5 mg Oral Daily  .  feeding supplement (ENSURE ENLIVE)  237 mL Oral BID BM  . feeding supplement (PRO-STAT SUGAR FREE 64)  30 mL Oral TID WC  . fluticasone  1-2 spray Each Nare Daily  . folic acid  1 mg Oral Daily  . furosemide  40 mg Oral Once  . hydroxychloroquine  400 mg Oral Daily  . leflunomide  20 mg Oral Daily  . lisinopril  10 mg Oral Daily  . mouth rinse  15 mL Mouth Rinse q12n4p  . metoprolol tartrate  100 mg Oral BID  . multivitamin with minerals  1 tablet Oral Daily  . potassium chloride  20 mEq Oral Once  . predniSONE  5 mg Oral Q breakfast  . risperiDONE  0.5 mg Oral QHS  . vitamin B-12  1,000 mcg Oral Daily   Continuous Infusions:  PRN Meds:.acetaminophen, LORazepam, [DISCONTINUED] ondansetron **OR** ondansetron (ZOFRAN) IV  Antibiotics  :   Anti-infectives (From admission, onward)   Start     Dose/Rate Route Frequency Ordered Stop   05/25/18 1000  hydroxychloroquine (PLAQUENIL) tablet 400 mg     400 mg Oral Daily 05/24/18 1405     05/25/18 0000  ceFEPIme (MAXIPIME) 2 g in sodium chloride 0.9 % 100 mL IVPB  Status:  Discontinued     2 g 200 mL/hr over 30 Minutes Intravenous Every 12 hours 05/24/18 1302 05/24/18 1405   05/25/18 0000  vancomycin (VANCOCIN) IVPB 750 mg/150 ml premix  Status:  Discontinued     750 mg 150 mL/hr over 60 Minutes Intravenous Every 12 hours 05/24/18 1302 05/24/18 1405   05/24/18 1145  ceFEPIme (MAXIPIME) 2 g in sodium chloride 0.9 % 100 mL IVPB     2 g 200 mL/hr over 30 Minutes  Intravenous  Once 05/24/18 1138 05/24/18 1221   05/24/18 1145  metroNIDAZOLE (FLAGYL) IVPB 500 mg  Status:  Discontinued     500 mg 100 mL/hr over 60 Minutes Intravenous Every 8 hours 05/24/18 1138 05/24/18 1405   05/24/18 1145  vancomycin (VANCOCIN) IVPB 1000 mg/200 mL premix     1,000 mg 200 mL/hr over 60 Minutes Intravenous  Once 05/24/18 1138 05/24/18 1531          Objective:   Vitals:   05/28/18 0529 05/28/18 1508 05/28/18 2104 05/29/18 0602  BP: (!) 155/84 (!) 133/50  (!) 115/48 (!) 141/77  Pulse: (!) 113 97 95 100  Resp: 17 20 20 19   Temp: 98.6 F (37 C) 98.8 F (37.1 C) 99.2 F (37.3 C) 98.1 F (36.7 C)  TempSrc: Oral Oral Oral Oral  SpO2: 98% 99% 95% 96%  Weight:      Height:        Wt Readings from Last 3 Encounters:  05/24/18 70.3 kg  10/07/16 74.8 kg  05/13/16 73.5 kg     Intake/Output Summary (Last 24 hours) at 05/29/2018 1042 Last data filed at 05/28/2018 1311 Gross per 24 hour  Intake 237 ml  Output -  Net 237 ml     Physical Exam  Awake Alert, Oriented X 3, No new F.N deficits, flat affect but not suicidal [place].AT,PERRAL Supple Neck,No JVD, No cervical lymphadenopathy appriciated.  Symmetrical Chest wall movement, Good air movement bilaterally, CTAB RRR,No Gallops, Rubs or new Murmurs, No Parasternal Heave +ve B.Sounds, Abd Soft, No tenderness, No organomegaly appriciated, No rebound - guarding or rigidity. No Cyanosis, Clubbing or edema, No new Rash or bruise     Data Review:    CBC Recent Labs  Lab 05/24/18 1111 05/24/18 1134 05/25/18 0312 05/25/18 1125 05/26/18 0325  WBC 4.2  --  4.6  --  3.5*  HGB 12.0 12.6 10.7*  --  10.8*  HCT 39.8 37.0 35.1* 34.7 35.2*  PLT 138*  --  107*  --  124*  MCV 107.0*  --  104.5*  --  103.8*  MCH 32.3  --  31.8  --  31.9  MCHC 30.2  --  30.5  --  30.7  RDW 14.2  --  13.9  --  14.0  LYMPHSABS 0.6*  --   --   --   --   MONOABS 0.7  --   --   --   --   EOSABS 0.1  --   --   --   --   BASOSABS 0.0  --   --   --   --     Chemistries  Recent Labs  Lab 05/24/18 1111 05/24/18 1134 05/25/18 0312 05/26/18 0325 05/29/18 0310  NA 141 141 141 139  --   K 3.4* 3.4* 3.3* 3.4* 3.9  CL 108 108 111 108  --   CO2 24  --  24 25  --   GLUCOSE 103* 97 71 82  --   BUN 16 22 13 15   --   CREATININE 0.68 0.60 0.63 0.71  --   CALCIUM 8.5*  --  8.1* 8.6*  --   MG  --   --   --  2.0  --   AST 28  --   --   --   --   ALT 42  --   --   --   --   ALKPHOS 70  --   --   --   --  BILITOT  1.0  --   --   --   --    ------------------------------------------------------------------------------------------------------------------ No results for input(s): CHOL, HDL, LDLCALC, TRIG, CHOLHDL, LDLDIRECT in the last 72 hours.  Lab Results  Component Value Date   HGBA1C 5.3 05/25/2018   ------------------------------------------------------------------------------------------------------------------ No results for input(s): TSH, T4TOTAL, T3FREE, THYROIDAB in the last 72 hours.  Invalid input(s): FREET3 ------------------------------------------------------------------------------------------------------------------ No results for input(s): VITAMINB12, FOLATE, FERRITIN, TIBC, IRON, RETICCTPCT in the last 72 hours.  Coagulation profile No results for input(s): INR, PROTIME in the last 168 hours.  No results for input(s): DDIMER in the last 72 hours.  Cardiac Enzymes No results for input(s): CKMB, TROPONINI, MYOGLOBIN in the last 168 hours.  Invalid input(s): CK ------------------------------------------------------------------------------------------------------------------ No results found for: BNP  Micro Results Recent Results (from the past 240 hour(s))  Urine culture     Status: None   Collection Time: 05/24/18 11:17 AM  Result Value Ref Range Status   Specimen Description IN/OUT CATH URINE  Final   Special Requests NONE  Final   Culture   Final    NO GROWTH Performed at [facility], [address] [place], [place] [address]    Report Status 05/25/2018 FINAL  Final  MRSA PCR Screening     Status: None   Collection Time: 05/24/18  4:50 PM  Result Value Ref Range Status   MRSA by PCR NEGATIVE NEGATIVE Final    Comment:        The GeneXpert MRSA Assay (FDA approved for NASAL specimens only), is one component of a comprehensive MRSA colonization surveillance program. It is not intended to diagnose MRSA infection nor to guide or monitor treatment  for MRSA infections. Performed at [facility], [place] [address], [place], [place] [address]     Radiology Reports Ct Head Wo Contrast  Result Date: 05/24/2018 CLINICAL DATA:  72 year old female found down at home. Unresponsive. Initial encounter. EXAM: CT HEAD WITHOUT CONTRAST TECHNIQUE: Contiguous axial images were obtained from the base of the skull through the vertex without intravenous contrast. COMPARISON:  01/31/2008 head CT. FINDINGS: Brain: Significant streak artifact through posterior fossa. Taking this limitation into account, no intracranial hemorrhage or CT evidence of large acute infarct noted. Mild global atrophy most notable parietal lobes. No intracranial mass lesion detected on this unenhanced exam. Vascular: No acute hyperdense vessel. Atherosclerotic changes cavernous segment carotid arteries. Skull: No skull fracture. Sinuses/Orbits: No acute orbital abnormality. Visualized paranasal sinuses, mastoid air cells and middle ear cavities are clear. Other: Nasal trumpet is in place. IMPRESSION: 1. Significant streak artifact through posterior fossa. Taking this limitation into account, no intracranial hemorrhage or CT evidence of large acute infarct noted. 2. Mild global atrophy most notable parietal lobes. Electronically Signed   By: Genia Del M.D.   On: 05/24/2018 11:45   Mr Brain Wo Contrast  Result Date: 05/25/2018 CLINICAL DATA:  Altered mental status EXAM: MRI HEAD WITHOUT CONTRAST TECHNIQUE: Multiplanar, multiecho pulse sequences of the brain and surrounding structures were obtained without intravenous contrast. COMPARISON:  None. FINDINGS: BRAIN: There is no acute infarct, acute hemorrhage, hydrocephalus or extra-axial collection. The midline structures are normal. No midline shift or other mass effect. There are no old infarcts. Multifocal white matter hyperintensity, most commonly due to chronic ischemic microangiopathy. The cerebral and cerebellar volume are  age-appropriate. Susceptibility-sensitive sequences show no chronic microhemorrhage or superficial siderosis. VASCULAR: Major intracranial arterial and venous sinus flow voids are normal. SKULL AND UPPER CERVICAL SPINE: Calvarial bone  marrow signal is normal. There is no skull base mass. Visualized upper cervical spine and soft tissues are normal. SINUSES/ORBITS: No fluid levels or advanced mucosal thickening. No mastoid or middle ear effusion. The orbits are normal. IMPRESSION: Chronic microvascular disease without acute intracranial abnormality. Electronically Signed   By: Ulyses Jarred M.D.   On: 05/25/2018 19:02   Dg Chest Port 1 View  Result Date: 05/24/2018 CLINICAL DATA:  Altered mental status EXAM: PORTABLE CHEST 1 VIEW COMPARISON:  12/11/2016 FINDINGS: Low lung volumes with bibasilar atelectasis. Heart is upper limits normal in size. No effusions or acute bony abnormality. IMPRESSION: Low lung volumes, bibasilar atelectasis. Electronically Signed   By: Rolm Baptise M.D.   On: 05/24/2018 11:52    Time Spent in minutes  30   Lala Lund M.D on 05/29/2018 at 10:42 AM  To page go to [email or web address] - password [facility]

## 2018-05-29 NOTE — Progress Notes (Signed)
Patient still refusing morning medications at this time.

## 2018-05-29 NOTE — Progress Notes (Signed)
Patient extremely paranoid and saying multiple times over again "why does it matter?" and "it's not like we care anyways." Patient continues to refuse medications including Lovenox. Dr. Candiss Norse made aware.

## 2018-05-29 NOTE — Progress Notes (Signed)
Pt continues to refuse medications. Pt states that these are not the medications that she normally takes. Tried to educate pt that the meds have not changed, but pt refused to take them. Will try again later.

## 2018-05-29 NOTE — Progress Notes (Signed)
Patient refusing all medications this morning. Per patient, "I just don't feel like I need them." Patient also found to have +2 edema in bilateral lower extremities this morning and is not cooperating with elevation despite education. Dr. Candiss Norse paged regarding refusal of medications.

## 2018-05-30 DIAGNOSIS — E785 Hyperlipidemia, unspecified: Secondary | ICD-10-CM | POA: Diagnosis not present

## 2018-05-30 DIAGNOSIS — R4182 Altered mental status, unspecified: Secondary | ICD-10-CM | POA: Diagnosis not present

## 2018-05-30 DIAGNOSIS — R40241 Glasgow coma scale score 13-15, unspecified time: Secondary | ICD-10-CM | POA: Diagnosis not present

## 2018-05-30 DIAGNOSIS — M069 Rheumatoid arthritis, unspecified: Secondary | ICD-10-CM | POA: Diagnosis not present

## 2018-05-30 DIAGNOSIS — E876 Hypokalemia: Secondary | ICD-10-CM | POA: Diagnosis not present

## 2018-05-30 DIAGNOSIS — E119 Type 2 diabetes mellitus without complications: Secondary | ICD-10-CM | POA: Diagnosis not present

## 2018-05-30 DIAGNOSIS — F333 Major depressive disorder, recurrent, severe with psychotic symptoms: Secondary | ICD-10-CM | POA: Diagnosis not present

## 2018-05-30 DIAGNOSIS — I10 Essential (primary) hypertension: Secondary | ICD-10-CM | POA: Diagnosis not present

## 2018-05-30 DIAGNOSIS — F329 Major depressive disorder, single episode, unspecified: Secondary | ICD-10-CM | POA: Diagnosis not present

## 2018-05-30 LAB — BASIC METABOLIC PANEL
ANION GAP: 9 (ref 5–15)
BUN: 17 mg/dL (ref 8–23)
CALCIUM: 9.1 mg/dL (ref 8.9–10.3)
CO2: 28 mmol/L (ref 22–32)
Chloride: 103 mmol/L (ref 98–111)
Creatinine, Ser: 0.86 mg/dL (ref 0.44–1.00)
Glucose, Bld: 102 mg/dL — ABNORMAL HIGH (ref 70–99)
Potassium: 3.8 mmol/L (ref 3.5–5.1)
SODIUM: 140 mmol/L (ref 135–145)

## 2018-05-30 MED ORDER — POTASSIUM CHLORIDE CRYS ER 20 MEQ PO TBCR
20.0000 meq | EXTENDED_RELEASE_TABLET | Freq: Once | ORAL | Status: AC
  Administered 2018-05-30: 20 meq via ORAL
  Filled 2018-05-30: qty 1

## 2018-05-30 MED ORDER — FUROSEMIDE 40 MG PO TABS
40.0000 mg | ORAL_TABLET | Freq: Once | ORAL | Status: AC
  Administered 2018-05-30: 40 mg via ORAL
  Filled 2018-05-30: qty 1

## 2018-05-30 NOTE — Progress Notes (Signed)
PROGRESS NOTE                                                                                                                                                                                                             Patient Demographics:    [name], is a 72 y.o. female, DOB - 29-Apr-1946, OEV:[id number]  Admit date - 05/24/2018   Admitting Physician Karmen Bongo, MD  Outpatient Primary MD for the patient is Leighton Ruff, MD  LOS - 0      Brief Narrative  [name] is a 72 y.o. female with medical history significant of hypothyroidism; HTN; HLD; and DM presenting with AMS. Her sister provided the history, as the patient is obtunded.  She reports that a friend was visiting the patient and that afterwards the friend reported to the nursing staff that she was "not acting quite right."  The nursing staff went to check on the patient and found her unresponsive.  The facility called the sister about 11AM to report that the patient was being transported to [facility].  Since the sister and her husband's arrival, the patient has been sleeping and unresponsive; she "doesn't usually sleep well" so this is unusual for her.  The sister last spoke to her about a week ago.  The facility did report that she apparently didn't feel well yesterday.  She moved into the facility about 3 weeks ago and had significant anxiety leading up to the move.  However, she has seemed to be happy there recently.     Her family does not believe that she would have intentionally taken her medications wrong, but this could have been an inadvertent medication misadventure.  The patient was started on a new medication for anxiety about 3-4 weeks ago, around the time she moved into the facility.    Subjective:    Patient in chair, appears comfortable, denies any headache, no fever, no chest pain or pressure, no shortness of breath , no abdominal pain. No focal weakness.  Having delusions that the  hospital staff are stealing her identity and information.    Assessment  & Plan :     AMS/ ? Underlying undiagnosed Dementia with Pilar Plate delusions - intentional polypharmacy overdose with depressed affect, with attempted suicide.  Her case was discussed with the psychiatrist on 05/26/2018 medication adjustments made she is currently on combination of Lexapro, Klonopin and Risperdal, continue Air cabin crew.  Unremarkable CT head, stable MRI brain, stable B12 TSH and RPR.  Medically  cleared await inpatient psych bed as requested by psychiatrist.   Depression - She was last seen at [facility] on 11/4, see above.   HTN - continue home dose ACE inhibitor, increased beta-blocker for better control.  HLD - Continue Lipitor  RA  - she is on a large number of DMARD medications - causing her to be chronically immunosuppressed. Resumed - Prednisone, Plaquenil, Arava,. Also on weekly methotrexate and biweekly Humira may resume them on discharge.   Hypokalemia.  Replaced will recheck tomorrow.    Mild edema.  Placed on TED stockings and Lasix, frequently refusing medications.    Stress related hyperglycemia.  Stable A1c.  Lab Results  Component Value Date   HGBA1C 5.3 05/25/2018     Family Communication  :  none  Code Status :  Full  Disposition Plan  : Psych medically stable for discharge  Consults  :  Psych  Procedures  :    CT head - Non acute  MRI nonacute.  DVT Prophylaxis  :  Lovenox   Lab Results  Component Value Date   PLT 124 (L) 05/26/2018    Diet :  Diet Order            Diet heart healthy/carb modified Room service appropriate? Yes; Fluid consistency: Thin  Diet effective now               Inpatient Medications Scheduled Meds: . aspirin EC  81 mg Oral Daily  . atorvastatin  40 mg Oral Daily  . chlorhexidine  15 mL Mouth Rinse BID  . cholecalciferol  2,000 Units Oral Daily  . clonazePAM  0.5 mg Oral QHS  . cycloSPORINE  1 drop Both Eyes BID  .  enoxaparin (LOVENOX) injection  40 mg Subcutaneous Q24H  . escitalopram  5 mg Oral Daily  . feeding supplement (ENSURE ENLIVE)  237 mL Oral BID BM  . feeding supplement (PRO-STAT SUGAR FREE 64)  30 mL Oral TID WC  . fluticasone  1-2 spray Each Nare Daily  . folic acid  1 mg Oral Daily  . hydroxychloroquine  400 mg Oral Daily  . leflunomide  20 mg Oral Daily  . lisinopril  10 mg Oral Daily  . mouth rinse  15 mL Mouth Rinse q12n4p  . metoprolol tartrate  100 mg Oral BID  . multivitamin with minerals  1 tablet Oral Daily  . potassium chloride  20 mEq Oral Once  . predniSONE  5 mg Oral Q breakfast  . risperiDONE  0.5 mg Oral QHS  . vitamin B-12  1,000 mcg Oral Daily   Continuous Infusions:  PRN Meds:.acetaminophen, LORazepam, [DISCONTINUED] ondansetron **OR** ondansetron (ZOFRAN) IV  Antibiotics  :   Anti-infectives (From admission, onward)   Start     Dose/Rate Route Frequency Ordered Stop   05/25/18 1000  hydroxychloroquine (PLAQUENIL) tablet 400 mg     400 mg Oral Daily 05/24/18 1405     05/25/18 0000  ceFEPIme (MAXIPIME) 2 g in sodium chloride 0.9 % 100 mL IVPB  Status:  Discontinued     2 g 200 mL/hr over 30 Minutes Intravenous Every 12 hours 05/24/18 1302 05/24/18 1405   05/25/18 0000  vancomycin (VANCOCIN) IVPB 750 mg/150 ml premix  Status:  Discontinued     750 mg 150 mL/hr over 60 Minutes Intravenous Every 12 hours 05/24/18 1302 05/24/18 1405   05/24/18 1145  ceFEPIme (MAXIPIME) 2 g in sodium chloride 0.9 % 100 mL IVPB     2 g  200 mL/hr over 30 Minutes Intravenous  Once 05/24/18 1138 05/24/18 1221   05/24/18 1145  metroNIDAZOLE (FLAGYL) IVPB 500 mg  Status:  Discontinued     500 mg 100 mL/hr over 60 Minutes Intravenous Every 8 hours 05/24/18 1138 05/24/18 1405   05/24/18 1145  vancomycin (VANCOCIN) IVPB 1000 mg/200 mL premix     1,000 mg 200 mL/hr over 60 Minutes Intravenous  Once 05/24/18 1138 05/24/18 1531          Objective:   Vitals:   05/29/18 2122 05/30/18  0500 05/30/18 0546 05/30/18 0835  BP: 138/77 (!) 125/45 (!) 125/45 (!) 124/48  Pulse: (!) 110 87 87   Resp: 18 18    Temp: 99.1 F (37.3 C) 98.8 F (37.1 C) 98.8 F (37.1 C)   TempSrc: Oral Oral Oral   SpO2: 96% 98% 98%   Weight:      Height:        Wt Readings from Last 3 Encounters:  05/24/18 70.3 kg  10/07/16 74.8 kg  05/13/16 73.5 kg     Intake/Output Summary (Last 24 hours) at 05/30/2018 0938 Last data filed at 05/29/2018 1859 Gross per 24 hour  Intake 530 ml  Output -  Net 530 ml     Physical Exam  Awake Alert, Oriented X 3, No new F.N deficits, flat affect but not suicidal [place].AT,PERRAL Supple Neck,No JVD, No cervical lymphadenopathy appriciated.  Symmetrical Chest wall movement, Good air movement bilaterally, CTAB RRR,No Gallops, Rubs or new Murmurs, No Parasternal Heave +ve B.Sounds, Abd Soft, No tenderness, No organomegaly appriciated, No rebound - guarding or rigidity. No Cyanosis, Clubbing or edema, No new Rash or bruise     Data Review:    CBC Recent Labs  Lab 05/24/18 1111 05/24/18 1134 05/25/18 0312 05/25/18 1125 05/26/18 0325  WBC 4.2  --  4.6  --  3.5*  HGB 12.0 12.6 10.7*  --  10.8*  HCT 39.8 37.0 35.1* 34.7 35.2*  PLT 138*  --  107*  --  124*  MCV 107.0*  --  104.5*  --  103.8*  MCH 32.3  --  31.8  --  31.9  MCHC 30.2  --  30.5  --  30.7  RDW 14.2  --  13.9  --  14.0  LYMPHSABS 0.6*  --   --   --   --   MONOABS 0.7  --   --   --   --   EOSABS 0.1  --   --   --   --   BASOSABS 0.0  --   --   --   --     Chemistries  Recent Labs  Lab 05/24/18 1111 05/24/18 1134 05/25/18 0312 05/26/18 0325 05/29/18 0310 05/30/18 0420  NA 141 141 141 139  --  140  K 3.4* 3.4* 3.3* 3.4* 3.9 3.8  CL 108 108 111 108  --  103  CO2 24  --  24 25  --  28  GLUCOSE 103* 97 71 82  --  102*  BUN 16 22 13 15   --  17  CREATININE 0.68 0.60 0.63 0.71  --  0.86  CALCIUM 8.5*  --  8.1* 8.6*  --  9.1  MG  --   --   --  2.0  --   --   AST 28  --   --    --   --   --   ALT 42  --   --   --   --   --  ALKPHOS 70  --   --   --   --   --   BILITOT 1.0  --   --   --   --   --    ------------------------------------------------------------------------------------------------------------------ No results for input(s): CHOL, HDL, LDLCALC, TRIG, CHOLHDL, LDLDIRECT in the last 72 hours.  Lab Results  Component Value Date   HGBA1C 5.3 05/25/2018   ------------------------------------------------------------------------------------------------------------------ No results for input(s): TSH, T4TOTAL, T3FREE, THYROIDAB in the last 72 hours.  Invalid input(s): FREET3 ------------------------------------------------------------------------------------------------------------------ No results for input(s): VITAMINB12, FOLATE, FERRITIN, TIBC, IRON, RETICCTPCT in the last 72 hours.  Coagulation profile No results for input(s): INR, PROTIME in the last 168 hours.  No results for input(s): DDIMER in the last 72 hours.  Cardiac Enzymes No results for input(s): CKMB, TROPONINI, MYOGLOBIN in the last 168 hours.  Invalid input(s): CK ------------------------------------------------------------------------------------------------------------------ No results found for: BNP  Micro Results Recent Results (from the past 240 hour(s))  Urine culture     Status: None   Collection Time: 05/24/18 11:17 AM  Result Value Ref Range Status   Specimen Description IN/OUT CATH URINE  Final   Special Requests NONE  Final   Culture   Final    NO GROWTH Performed at [facility], [address] [place], [place] [address]    Report Status 05/25/2018 FINAL  Final  MRSA PCR Screening     Status: None   Collection Time: 05/24/18  4:50 PM  Result Value Ref Range Status   MRSA by PCR NEGATIVE NEGATIVE Final    Comment:        The GeneXpert MRSA Assay (FDA approved for NASAL specimens only), is one component of a comprehensive MRSA  colonization surveillance program. It is not intended to diagnose MRSA infection nor to guide or monitor treatment for MRSA infections. Performed at [facility], [address] [place], [place] [address]     Radiology Reports Ct Head Wo Contrast  Result Date: 05/24/2018 CLINICAL DATA:  72 year old female found down at home. Unresponsive. Initial encounter. EXAM: CT HEAD WITHOUT CONTRAST TECHNIQUE: Contiguous axial images were obtained from the base of the skull through the vertex without intravenous contrast. COMPARISON:  01/31/2008 head CT. FINDINGS: Brain: Significant streak artifact through posterior fossa. Taking this limitation into account, no intracranial hemorrhage or CT evidence of large acute infarct noted. Mild global atrophy most notable parietal lobes. No intracranial mass lesion detected on this unenhanced exam. Vascular: No acute hyperdense vessel. Atherosclerotic changes cavernous segment carotid arteries. Skull: No skull fracture. Sinuses/Orbits: No acute orbital abnormality. Visualized paranasal sinuses, mastoid air cells and middle ear cavities are clear. Other: Nasal trumpet is in place. IMPRESSION: 1. Significant streak artifact through posterior fossa. Taking this limitation into account, no intracranial hemorrhage or CT evidence of large acute infarct noted. 2. Mild global atrophy most notable parietal lobes. Electronically Signed   By: Genia Del M.D.   On: 05/24/2018 11:45   Mr Brain Wo Contrast  Result Date: 05/25/2018 CLINICAL DATA:  Altered mental status EXAM: MRI HEAD WITHOUT CONTRAST TECHNIQUE: Multiplanar, multiecho pulse sequences of the brain and surrounding structures were obtained without intravenous contrast. COMPARISON:  None. FINDINGS: BRAIN: There is no acute infarct, acute hemorrhage, hydrocephalus or extra-axial collection. The midline structures are normal. No midline shift or other mass effect. There are no old infarcts. Multifocal white matter  hyperintensity, most commonly due to chronic ischemic microangiopathy. The cerebral and cerebellar volume are age-appropriate. Susceptibility-sensitive sequences show no chronic microhemorrhage or  superficial siderosis. VASCULAR: Major intracranial arterial and venous sinus flow voids are normal. SKULL AND UPPER CERVICAL SPINE: Calvarial bone marrow signal is normal. There is no skull base mass. Visualized upper cervical spine and soft tissues are normal. SINUSES/ORBITS: No fluid levels or advanced mucosal thickening. No mastoid or middle ear effusion. The orbits are normal. IMPRESSION: Chronic microvascular disease without acute intracranial abnormality. Electronically Signed   By: Ulyses Jarred M.D.   On: 05/25/2018 19:02   Dg Chest Port 1 View  Result Date: 05/24/2018 CLINICAL DATA:  Altered mental status EXAM: PORTABLE CHEST 1 VIEW COMPARISON:  12/11/2016 FINDINGS: Low lung volumes with bibasilar atelectasis. Heart is upper limits normal in size. No effusions or acute bony abnormality. IMPRESSION: Low lung volumes, bibasilar atelectasis. Electronically Signed   By: Rolm Baptise M.D.   On: 05/24/2018 11:52    Time Spent in minutes  30   Lala Lund M.D on 05/30/2018 at 9:38 AM  To page go to [email or web address] - password [email or web address]

## 2018-05-31 DIAGNOSIS — E876 Hypokalemia: Secondary | ICD-10-CM | POA: Diagnosis not present

## 2018-05-31 DIAGNOSIS — R40241 Glasgow coma scale score 13-15, unspecified time: Secondary | ICD-10-CM | POA: Diagnosis not present

## 2018-05-31 DIAGNOSIS — F329 Major depressive disorder, single episode, unspecified: Secondary | ICD-10-CM | POA: Diagnosis not present

## 2018-05-31 DIAGNOSIS — I10 Essential (primary) hypertension: Secondary | ICD-10-CM | POA: Diagnosis not present

## 2018-05-31 DIAGNOSIS — R4182 Altered mental status, unspecified: Secondary | ICD-10-CM | POA: Diagnosis not present

## 2018-05-31 DIAGNOSIS — M069 Rheumatoid arthritis, unspecified: Secondary | ICD-10-CM | POA: Diagnosis not present

## 2018-05-31 DIAGNOSIS — E119 Type 2 diabetes mellitus without complications: Secondary | ICD-10-CM | POA: Diagnosis not present

## 2018-05-31 DIAGNOSIS — F333 Major depressive disorder, recurrent, severe with psychotic symptoms: Secondary | ICD-10-CM | POA: Diagnosis not present

## 2018-05-31 DIAGNOSIS — E785 Hyperlipidemia, unspecified: Secondary | ICD-10-CM | POA: Diagnosis not present

## 2018-05-31 MED ORDER — LORAZEPAM 2 MG/ML IJ SOLN
1.0000 mg | Freq: Once | INTRAMUSCULAR | Status: AC
  Administered 2018-05-31: 1 mg via INTRAVENOUS
  Filled 2018-05-31: qty 1

## 2018-05-31 NOTE — Progress Notes (Signed)
2215 Patient trying to leave nursing department. Patient states,' I don't believe this is a hospital.I know you keep telling me that this is [facility] but I don't believe it. I need to get out of here." Patient walking towards the elevator with staff present security called to assist.Text page sent to NP Bodenheimer. Patient talked with security officers reinforced that patient is is hospital patient allowed security personnel to escort her back to her room.Patient asking to make phone call and telephone provided for her.One time order for ativan ordered by NP Bodenheimer and medication given.

## 2018-05-31 NOTE — Progress Notes (Signed)
[place] stated that patient was denied due to being medically unstable. CW sent referral again that clearly states "medically stable".   [name] [name] LCSW [phone]

## 2018-05-31 NOTE — Progress Notes (Signed)
PROGRESS NOTE                                                                                                                                                                                                             Patient Demographics:    [name], is a 72 y.o. female, DOB - [birth year], OZD:[id number]  Admit date - 05/24/2018   Admitting Physician Karmen Bongo, MD  Outpatient Primary MD for the patient is Leighton Ruff, MD  LOS - 0      Brief Narrative  [name] is a 72 y.o. female with medical history significant of hypothyroidism; HTN; HLD; and DM presenting with AMS. Her sister provided the history, as the patient is obtunded.  She reports that a friend was visiting the patient and that afterwards the friend reported to the nursing staff that she was "not acting quite right."  The nursing staff went to check on the patient and found her unresponsive.  The facility called the sister about 11AM to report that the patient was being transported to [facility].  Since the sister and her husband's arrival, the patient has been sleeping and unresponsive; she "doesn't usually sleep well" so this is unusual for her.  The sister last spoke to her about a week ago.  The facility did report that she apparently didn't feel well yesterday.  She moved into the facility about 3 weeks ago and had significant anxiety leading up to the move.  However, she has seemed to be happy there recently.     Her family does not believe that she would have intentionally taken her medications wrong, but this could have been an inadvertent medication misadventure.  The patient was started on a new medication for anxiety about 3-4 weeks ago, around the time she moved into the facility.    Subjective:    Patient sitting in recliner in no distress denies any headache chest or abdominal pain, thinks that he is not being seen by physicians or hospital staff but there are people who are trying to  steal her information    Assessment  & Plan :     AMS/ ? Underlying undiagnosed Dementia with Pilar Plate delusions - intentional polypharmacy overdose with depressed affect, with attempted suicide.  Her case was discussed with the psychiatrist on 05/26/2018 medication adjustments made she is currently on combination of Lexapro, Klonopin and Risperdal, continue Air cabin crew.  Unremarkable CT head, stable MRI brain, stable B12 TSH and RPR.  Medically cleared await  inpatient psych bed as requested by psychiatrist.   Depression - She was last seen at [facility] on 11/4, see above.   HTN - continue home dose ACE inhibitor, increased beta-blocker for better control.  HLD - Continue Lipitor  RA  - she is on a large number of DMARD medications - causing her to be chronically immunosuppressed. Resumed - Prednisone, Plaquenil, Arava,. Also on weekly methotrexate and biweekly Humira may resume them on discharge.   Hypokalemia.  Replaced will recheck tomorrow.    Mild edema.  Placed on TED stockings and Lasix, frequently refusing medications.    Stress related hyperglycemia.  Stable A1c.  Lab Results  Component Value Date   HGBA1C 5.3 05/25/2018     Family Communication  :  none  Code Status :  Full  Disposition Plan  : Psych medically stable for discharge  Consults  :  Psych  Procedures  :    CT head - Non acute  MRI nonacute.  DVT Prophylaxis  :  Lovenox   Lab Results  Component Value Date   PLT 124 (L) 05/26/2018    Diet :  Diet Order            Diet heart healthy/carb modified Room service appropriate? Yes; Fluid consistency: Thin  Diet effective now               Inpatient Medications Scheduled Meds: . aspirin EC  81 mg Oral Daily  . atorvastatin  40 mg Oral Daily  . chlorhexidine  15 mL Mouth Rinse BID  . cholecalciferol  2,000 Units Oral Daily  . clonazePAM  0.5 mg Oral QHS  . cycloSPORINE  1 drop Both Eyes BID  . enoxaparin (LOVENOX) injection  40 mg  Subcutaneous Q24H  . escitalopram  5 mg Oral Daily  . feeding supplement (ENSURE ENLIVE)  237 mL Oral BID BM  . feeding supplement (PRO-STAT SUGAR FREE 64)  30 mL Oral TID WC  . fluticasone  1-2 spray Each Nare Daily  . folic acid  1 mg Oral Daily  . hydroxychloroquine  400 mg Oral Daily  . leflunomide  20 mg Oral Daily  . lisinopril  10 mg Oral Daily  . mouth rinse  15 mL Mouth Rinse q12n4p  . metoprolol tartrate  100 mg Oral BID  . multivitamin with minerals  1 tablet Oral Daily  . predniSONE  5 mg Oral Q breakfast  . risperiDONE  0.5 mg Oral QHS  . vitamin B-12  1,000 mcg Oral Daily   Continuous Infusions:  PRN Meds:.acetaminophen, LORazepam, [DISCONTINUED] ondansetron **OR** ondansetron (ZOFRAN) IV  Antibiotics  :   Anti-infectives (From admission, onward)   Start     Dose/Rate Route Frequency Ordered Stop   05/25/18 1000  hydroxychloroquine (PLAQUENIL) tablet 400 mg     400 mg Oral Daily 05/24/18 1405     05/25/18 0000  ceFEPIme (MAXIPIME) 2 g in sodium chloride 0.9 % 100 mL IVPB  Status:  Discontinued     2 g 200 mL/hr over 30 Minutes Intravenous Every 12 hours 05/24/18 1302 05/24/18 1405   05/25/18 0000  vancomycin (VANCOCIN) IVPB 750 mg/150 ml premix  Status:  Discontinued     750 mg 150 mL/hr over 60 Minutes Intravenous Every 12 hours 05/24/18 1302 05/24/18 1405   05/24/18 1145  ceFEPIme (MAXIPIME) 2 g in sodium chloride 0.9 % 100 mL IVPB     2 g 200 mL/hr over 30 Minutes Intravenous  Once 05/24/18 1138 05/24/18  1221   05/24/18 1145  metroNIDAZOLE (FLAGYL) IVPB 500 mg  Status:  Discontinued     500 mg 100 mL/hr over 60 Minutes Intravenous Every 8 hours 05/24/18 1138 05/24/18 1405   05/24/18 1145  vancomycin (VANCOCIN) IVPB 1000 mg/200 mL premix     1,000 mg 200 mL/hr over 60 Minutes Intravenous  Once 05/24/18 1138 05/24/18 1531          Objective:   Vitals:   05/30/18 0835 05/30/18 1514 05/30/18 2205 05/31/18 0547  BP: (!) 124/48 133/61 (!) 122/56 (!) 116/56    Pulse:  96 99 85  Resp:  16 20 20   Temp:  98.9 F (37.2 C) 97.9 F (36.6 C) 98.7 F (37.1 C)  TempSrc:  Oral Oral Oral  SpO2:  97% 97% 95%  Weight:      Height:        Wt Readings from Last 3 Encounters:  05/24/18 70.3 kg  10/07/16 74.8 kg  05/13/16 73.5 kg     Intake/Output Summary (Last 24 hours) at 05/31/2018 0845 Last data filed at 05/31/2018 0752 Gross per 24 hour  Intake 1197 ml  Output 1100 ml  Net 97 ml     Physical Exam  Awake Alert, Oriented X 3, No new F.N deficits, flat affect with continued bizarre delusions [place].AT,PERRAL Supple Neck,No JVD, No cervical lymphadenopathy appriciated.  Symmetrical Chest wall movement, Good air movement bilaterally, CTAB RRR,No Gallops, Rubs or new Murmurs, No Parasternal Heave +ve B.Sounds, Abd Soft, No tenderness, No organomegaly appriciated, No rebound - guarding or rigidity. No Cyanosis, Clubbing or edema, No new Rash or bruise     Data Review:    CBC Recent Labs  Lab 05/24/18 1111 05/24/18 1134 05/25/18 0312 05/25/18 1125 05/26/18 0325  WBC 4.2  --  4.6  --  3.5*  HGB 12.0 12.6 10.7*  --  10.8*  HCT 39.8 37.0 35.1* 34.7 35.2*  PLT 138*  --  107*  --  124*  MCV 107.0*  --  104.5*  --  103.8*  MCH 32.3  --  31.8  --  31.9  MCHC 30.2  --  30.5  --  30.7  RDW 14.2  --  13.9  --  14.0  LYMPHSABS 0.6*  --   --   --   --   MONOABS 0.7  --   --   --   --   EOSABS 0.1  --   --   --   --   BASOSABS 0.0  --   --   --   --     Chemistries  Recent Labs  Lab 05/24/18 1111 05/24/18 1134 05/25/18 0312 05/26/18 0325 05/29/18 0310 05/30/18 0420  NA 141 141 141 139  --  140  K 3.4* 3.4* 3.3* 3.4* 3.9 3.8  CL 108 108 111 108  --  103  CO2 24  --  24 25  --  28  GLUCOSE 103* 97 71 82  --  102*  BUN 16 22 13 15   --  17  CREATININE 0.68 0.60 0.63 0.71  --  0.86  CALCIUM 8.5*  --  8.1* 8.6*  --  9.1  MG  --   --   --  2.0  --   --   AST 28  --   --   --   --   --   ALT 42  --   --   --   --   --  ALKPHOS 70   --   --   --   --   --   BILITOT 1.0  --   --   --   --   --    ------------------------------------------------------------------------------------------------------------------ No results for input(s): CHOL, HDL, LDLCALC, TRIG, CHOLHDL, LDLDIRECT in the last 72 hours.  Lab Results  Component Value Date   HGBA1C 5.3 05/25/2018   ------------------------------------------------------------------------------------------------------------------ No results for input(s): TSH, T4TOTAL, T3FREE, THYROIDAB in the last 72 hours.  Invalid input(s): FREET3 ------------------------------------------------------------------------------------------------------------------ No results for input(s): VITAMINB12, FOLATE, FERRITIN, TIBC, IRON, RETICCTPCT in the last 72 hours.  Coagulation profile No results for input(s): INR, PROTIME in the last 168 hours.  No results for input(s): DDIMER in the last 72 hours.  Cardiac Enzymes No results for input(s): CKMB, TROPONINI, MYOGLOBIN in the last 168 hours.  Invalid input(s): CK ------------------------------------------------------------------------------------------------------------------ No results found for: BNP  Micro Results Recent Results (from the past 240 hour(s))  Urine culture     Status: None   Collection Time: 05/24/18 11:17 AM  Result Value Ref Range Status   Specimen Description IN/OUT CATH URINE  Final   Special Requests NONE  Final   Culture   Final    NO GROWTH Performed at [facility], [address] [place], [place] [address]    Report Status 05/25/2018 FINAL  Final  MRSA PCR Screening     Status: None   Collection Time: 05/24/18  4:50 PM  Result Value Ref Range Status   MRSA by PCR NEGATIVE NEGATIVE Final    Comment:        The GeneXpert MRSA Assay (FDA approved for NASAL specimens only), is one component of a comprehensive MRSA colonization surveillance program. It is not intended to diagnose MRSA infection  nor to guide or monitor treatment for MRSA infections. Performed at [facility], [place] [address] [place], [place] [address]     Radiology Reports Ct Head Wo Contrast  Result Date: 05/24/2018 CLINICAL DATA:  72 year old female found down at home. Unresponsive. Initial encounter. EXAM: CT HEAD WITHOUT CONTRAST TECHNIQUE: Contiguous axial images were obtained from the base of the skull through the vertex without intravenous contrast. COMPARISON:  01/31/2008 head CT. FINDINGS: Brain: Significant streak artifact through posterior fossa. Taking this limitation into account, no intracranial hemorrhage or CT evidence of large acute infarct noted. Mild global atrophy most notable parietal lobes. No intracranial mass lesion detected on this unenhanced exam. Vascular: No acute hyperdense vessel. Atherosclerotic changes cavernous segment carotid arteries. Skull: No skull fracture. Sinuses/Orbits: No acute orbital abnormality. Visualized paranasal sinuses, mastoid air cells and middle ear cavities are clear. Other: Nasal trumpet is in place. IMPRESSION: 1. Significant streak artifact through posterior fossa. Taking this limitation into account, no intracranial hemorrhage or CT evidence of large acute infarct noted. 2. Mild global atrophy most notable parietal lobes. Electronically Signed   By: Genia Del M.D.   On: 05/24/2018 11:45   Mr Brain Wo Contrast  Result Date: 05/25/2018 CLINICAL DATA:  Altered mental status EXAM: MRI HEAD WITHOUT CONTRAST TECHNIQUE: Multiplanar, multiecho pulse sequences of the brain and surrounding structures were obtained without intravenous contrast. COMPARISON:  None. FINDINGS: BRAIN: There is no acute infarct, acute hemorrhage, hydrocephalus or extra-axial collection. The midline structures are normal. No midline shift or other mass effect. There are no old infarcts. Multifocal white matter hyperintensity, most commonly due to chronic ischemic microangiopathy. The cerebral  and cerebellar volume are age-appropriate. Susceptibility-sensitive sequences show no chronic microhemorrhage  or superficial siderosis. VASCULAR: Major intracranial arterial and venous sinus flow voids are normal. SKULL AND UPPER CERVICAL SPINE: Calvarial bone marrow signal is normal. There is no skull base mass. Visualized upper cervical spine and soft tissues are normal. SINUSES/ORBITS: No fluid levels or advanced mucosal thickening. No mastoid or middle ear effusion. The orbits are normal. IMPRESSION: Chronic microvascular disease without acute intracranial abnormality. Electronically Signed   By: Ulyses Jarred M.D.   On: 05/25/2018 19:02   Dg Chest Port 1 View  Result Date: 05/24/2018 CLINICAL DATA:  Altered mental status EXAM: PORTABLE CHEST 1 VIEW COMPARISON:  12/11/2016 FINDINGS: Low lung volumes with bibasilar atelectasis. Heart is upper limits normal in size. No effusions or acute bony abnormality. IMPRESSION: Low lung volumes, bibasilar atelectasis. Electronically Signed   By: Rolm Baptise M.D.   On: 05/24/2018 11:52    Time Spent in minutes  30   Lala Lund M.D on 05/31/2018 at 8:45 AM  To page go to [email or web address] - password [email or web address]

## 2018-05-31 NOTE — Progress Notes (Addendum)
Physical Therapy Treatment Patient Details Name: [name] MRN: [id number] DOB: 12/19/1945 Today's Date: 05/31/2018    History of Present Illness [name] is a 72 y.o. female with medical history significant of hypothyroidism; HTN; HLD; and DM presenting with AMS.    PT Comments    Pt admitted with above diagnosis. Pt currently with functional limitations due to balance and endurance deficits. Pt was able to ambulate in hallway with min guard to supervision assist.  Pt very paranoid and anxious which limits therapist in treatment.  Will continue acute PT.   Pt will benefit from skilled PT to increase their independence and safety with mobility to allow discharge to the venue listed below.     Follow Up Recommendations  Other (comment)(inpatient psychiatric hospital)     Equipment Recommendations  Rolling walker with 5" wheels    Recommendations for Other Services OT consult     Precautions / Restrictions Precautions Precautions: Fall;Other (comment) Restrictions Weight Bearing Restrictions: No    Mobility  Bed Mobility Overal bed mobility: Needs Assistance             General bed mobility comments: sittng EOB on arrival  Transfers Overall transfer level: Needs assistance Equipment used: None Transfers: Sit to/from Stand Sit to Stand: Supervision;Min guard         General transfer comment: guard assist due to pt appears unsure on her feet  Ambulation/Gait Ambulation/Gait assistance: Min guard;Supervision Gait Distance (Feet): 200 Feet Assistive device: None Gait Pattern/deviations: Step-to pattern;Step-through pattern;Decreased stride length;Shuffle;Drifts right/left Gait velocity: decreased Gait velocity interpretation: <1.8 ft/sec, indicate of risk for recurrent falls General Gait Details: ambulated no device with min guard at times and pt utilizing wall rail at times, but more limited and slow due to lack of direction despite cues,  hesitant and anxious.  Pt drifts right but feel she was just trying to stay away from PT as she is paranoid and anxious.  Pt did not significantly lose balance while ambulating.     Stairs             Wheelchair Mobility    Modified Rankin (Stroke Patients Only)       Balance Overall balance assessment: Needs assistance Sitting-balance support: No upper extremity supported Sitting balance-Leahy Scale: Good     Standing balance support: Single extremity supported Standing balance-Leahy Scale: Good Standing balance comment: static balance good, with function needs minguard to S for safety due to imbalance               High Level Balance Comments: attempted to engage pt in side stepping and other balance activities, she declined despite encouragement            Cognition Arousal/Alertness: Awake/alert Behavior During Therapy: Anxious;Restless Overall Cognitive Status: Impaired/Different from baseline Area of Impairment: Orientation;Memory;Safety/judgement;Problem solving                 Orientation Level: Situation;Time;Disoriented to   Memory: Decreased short-term memory   Safety/Judgement: Decreased awareness of safety;Decreased awareness of deficits   Problem Solving: Slow processing;Decreased initiation;Requires verbal cues;Requires tactile cues General Comments: Paranoid and anxious      Exercises General Exercises - Lower Extremity Toe Raises: AROM;Both;5 reps;Standing(refused to do any more after performing  5 reps)    General Comments        Pertinent Vitals/Pain Pain Assessment: No/denies pain    Home Living  Prior Function            PT Goals (current goals can now be found in the care plan section) Acute Rehab PT Goals Patient Stated Goal: none stated Progress towards PT goals: Progressing toward goals    Frequency    Min 3X/week      PT Plan Current plan remains appropriate     Co-evaluation              AM-PAC PT "6 Clicks" Daily Activity  Outcome Measure  Difficulty turning over in bed (including adjusting bedclothes, sheets and blankets)?: A Little Difficulty moving from lying on back to sitting on the side of the bed? : A Little Difficulty sitting down on and standing up from a chair with arms (e.g., wheelchair, bedside commode, etc,.)?: Unable Help needed moving to and from a bed to chair (including a wheelchair)?: A Little Help needed walking in hospital room?: A Little Help needed climbing 3-5 steps with a railing? : A Little 6 Click Score: 16    End of Session Equipment Utilized During Treatment: Gait belt Activity Tolerance: Patient tolerated treatment well(self limiting) Patient left: with call bell/phone within reach;in chair;with nursing/sitter in room Nurse Communication: Mobility status PT Visit Diagnosis: Unsteadiness on feet (R26.81);Muscle weakness (generalized) (M62.81);Difficulty in walking, not elsewhere classified (R26.2)     Time: 6789-3810 PT Time Calculation (min) (ACUTE ONLY): 11 min  Charges:  $Gait Training: 8-22 mins                     [place] Pager:  [phone]  Office:  [place] 05/31/2018, 11:34 AM

## 2018-06-01 ENCOUNTER — Observation Stay (HOSPITAL_COMMUNITY): Payer: Medicare HMO

## 2018-06-01 ENCOUNTER — Other Ambulatory Visit: Payer: Self-pay

## 2018-06-01 DIAGNOSIS — E119 Type 2 diabetes mellitus without complications: Secondary | ICD-10-CM | POA: Diagnosis not present

## 2018-06-01 DIAGNOSIS — R40241 Glasgow coma scale score 13-15, unspecified time: Secondary | ICD-10-CM | POA: Diagnosis not present

## 2018-06-01 DIAGNOSIS — I10 Essential (primary) hypertension: Secondary | ICD-10-CM | POA: Diagnosis not present

## 2018-06-01 DIAGNOSIS — Z9049 Acquired absence of other specified parts of digestive tract: Secondary | ICD-10-CM | POA: Diagnosis not present

## 2018-06-01 DIAGNOSIS — Z90710 Acquired absence of both cervix and uterus: Secondary | ICD-10-CM | POA: Diagnosis not present

## 2018-06-01 DIAGNOSIS — R824 Acetonuria: Secondary | ICD-10-CM | POA: Diagnosis not present

## 2018-06-01 DIAGNOSIS — F419 Anxiety disorder, unspecified: Secondary | ICD-10-CM | POA: Diagnosis not present

## 2018-06-01 DIAGNOSIS — F329 Major depressive disorder, single episode, unspecified: Secondary | ICD-10-CM | POA: Diagnosis not present

## 2018-06-01 DIAGNOSIS — R4182 Altered mental status, unspecified: Secondary | ICD-10-CM | POA: Diagnosis not present

## 2018-06-01 DIAGNOSIS — E785 Hyperlipidemia, unspecified: Secondary | ICD-10-CM | POA: Diagnosis not present

## 2018-06-01 DIAGNOSIS — M069 Rheumatoid arthritis, unspecified: Secondary | ICD-10-CM | POA: Diagnosis not present

## 2018-06-01 DIAGNOSIS — F333 Major depressive disorder, recurrent, severe with psychotic symptoms: Secondary | ICD-10-CM | POA: Diagnosis not present

## 2018-06-01 DIAGNOSIS — E876 Hypokalemia: Secondary | ICD-10-CM | POA: Diagnosis not present

## 2018-06-01 DIAGNOSIS — R0602 Shortness of breath: Secondary | ICD-10-CM | POA: Diagnosis not present

## 2018-06-01 LAB — URINALYSIS, ROUTINE W REFLEX MICROSCOPIC
BILIRUBIN URINE: NEGATIVE
Bacteria, UA: NONE SEEN
GLUCOSE, UA: NEGATIVE mg/dL
HGB URINE DIPSTICK: NEGATIVE
KETONES UR: NEGATIVE mg/dL
Nitrite: NEGATIVE
PH: 7 (ref 5.0–8.0)
Protein, ur: NEGATIVE mg/dL
Specific Gravity, Urine: 1.017 (ref 1.005–1.030)

## 2018-06-01 MED ORDER — CLONAZEPAM 1 MG PO TABS
1.0000 mg | ORAL_TABLET | Freq: Every day | ORAL | Status: DC
  Administered 2018-06-01: 1 mg via ORAL
  Filled 2018-06-01: qty 1

## 2018-06-01 MED ORDER — ENSURE ENLIVE PO LIQD
237.0000 mL | ORAL | Status: DC
  Administered 2018-06-01 – 2018-06-02 (×2): 237 mL via ORAL

## 2018-06-01 MED ORDER — OLANZAPINE 5 MG PO TBDP
2.5000 mg | ORAL_TABLET | Freq: Every day | ORAL | Status: DC
  Administered 2018-06-01: 2.5 mg via ORAL
  Filled 2018-06-01 (×2): qty 0.5

## 2018-06-01 MED ORDER — PAROXETINE HCL 40 MG PO TABS: 60.0000 mg | ORAL_TABLET | Freq: Every day | ORAL | 0 refills | Status: DC

## 2018-06-01 MED ORDER — ESCITALOPRAM OXALATE 10 MG PO TABS
10.0000 mg | ORAL_TABLET | Freq: Every day | ORAL | Status: DC
  Administered 2018-06-02: 10 mg via ORAL
  Filled 2018-06-01: qty 1

## 2018-06-01 MED ORDER — HALOPERIDOL LACTATE 5 MG/ML IJ SOLN: 2.0000 mg | Freq: Four times a day (QID) | INTRAMUSCULAR | Status: DC | PRN

## 2018-06-01 NOTE — Consult Note (Signed)
[facility] Psych Consult Progress Note  06/01/2018 1:43 PM [name]  MRN:  [id number] Subjective:   [name] was last seen on 11/13 suicide attempt by Xanax overdose at her living facility. She was started on Lexapro and Risperdal and recommended for inpatient psychiatric hospitalization.   On interview, [name] is oriented to person, place and time. She reports poor appetite and poor sleep. Her sister is at bedside with her verbal consent and reports that she is paranoid. The patient reports, "I can't talk about it" when asked about her paranoia. Her sister reports that she believes that her food is poisoned. She also reports hearing voices in the hall but she is unable to make out what they are saying and they appear to be "on another level." She reports intermittent SI. She denies HI. Her family reports that she has been minimally talkative.   Principal Problem: MDD (major depressive disorder), recurrent, severe, with psychosis ([place]) Diagnosis:  Principal Problem:   MDD (major depressive disorder), recurrent, severe, with psychosis ([place]) Active Problems:   Type 2 diabetes mellitus without complication ([place])   Rheumatoid arthritis ([place])   Hypertension   Hyperlipidemia   Depression   Altered mental status   Ketonuria  Total Time spent with patient: 15 minutes  Past Psychiatric History: Mood disorder, OCD, anxiety and depression.   Past Medical History:  Past Medical History:  Diagnosis Date  . Anxiety   . Arthritis    rhematoid,osteoarthritis  . Diabetes mellitus without complication ([place])   . Elevated cholesterol   . Hemorrhoids   . Hypertension   . Hypothyroidism   . Joint pain   . Nocturia   . Sleeping difficulties     Past Surgical History:  Procedure Laterality Date  . ABDOMINAL HYSTERECTOMY    . BACK SURGERY    . CERVICAL FUSION  2004  . CHOLECYSTECTOMY    . EYE SURGERY Left    cataract  . PARS PLANA REPAIR OF RETINAL DEATACHMENT    . THYROIDECTOMY,  PARTIAL     Family History:  Family History  Problem Relation Age of Onset  . Diabetes Mother   . Breast cancer Mother   . Gout Mother   . Alzheimer's disease Father   . Heart Problems Father   . Breast cancer Sister   . Alzheimer's disease Sister   . Diabetes Sister   . Gout Sister   . Heart Problems Sister   . Alzheimer's disease Brother   . Diabetes Brother   . Heart Problems Brother    Family Psychiatric  History: Father, brother and sister-Alzheimer's disease.   Social History:  Social History   Substance and Sexual Activity  Alcohol Use No     Social History   Substance and Sexual Activity  Drug Use No    Social History   Socioeconomic History  . Marital status: Widowed    Spouse name: Not on file  . Number of children: 0  . Years of education: HS  . Highest education level: Not on file  Occupational History  . Not on file  Social Needs  . Financial resource strain: Not on file  . Food insecurity:    Worry: Not on file    Inability: Not on file  . Transportation needs:    Medical: Not on file    Non-medical: Not on file  Tobacco Use  . Smoking status: Never Smoker  . Smokeless tobacco: Never Used  Substance and Sexual Activity  . Alcohol  use: No  . Drug use: No  . Sexual activity: Not on file  Lifestyle  . Physical activity:    Days per week: Not on file    Minutes per session: Not on file  . Stress: Not on file  Relationships  . Social connections:    Talks on phone: Not on file    Gets together: Not on file    Attends religious service: Not on file    Active member of club or organization: Not on file    Attends meetings of clubs or organizations: Not on file    Relationship status: Not on file  Other Topics Concern  . Not on file  Social History Narrative   Drinks 3 caffeine drinks a day     Sleep: Poor  Appetite:  Poor  Current Medications: Current Facility-Administered Medications  Medication Dose Route Frequency Provider  Last Rate Last Dose  . acetaminophen (TYLENOL) tablet 650 mg  650 mg Oral Q6H PRN Thurnell Lose, MD      . aspirin EC tablet 81 mg  81 mg Oral Daily Karmen Bongo, MD   81 mg at 06/01/18 1009  . atorvastatin (LIPITOR) tablet 40 mg  40 mg Oral Daily Karmen Bongo, MD   40 mg at 06/01/18 1010  . chlorhexidine (PERIDEX) 0.12 % solution 15 mL  15 mL Mouth Rinse BID Karmen Bongo, MD   15 mL at 05/31/18 0908  . cholecalciferol (VITAMIN D3) tablet 2,000 Units  2,000 Units Oral Daily Thurnell Lose, MD   2,000 Units at 06/01/18 1009  . clonazePAM (KLONOPIN) tablet 1 mg  1 mg Oral QHS Thurnell Lose, MD      . cycloSPORINE (RESTASIS) 0.05 % ophthalmic emulsion 1 drop  1 drop Both Eyes BID Karmen Bongo, MD   1 drop at 06/01/18 1009  . enoxaparin (LOVENOX) injection 40 mg  40 mg Subcutaneous Q24H Karmen Bongo, MD   40 mg at 05/31/18 1810  . escitalopram (LEXAPRO) tablet 5 mg  5 mg Oral Daily Thurnell Lose, MD   5 mg at 06/01/18 1010  . feeding supplement (ENSURE ENLIVE) (ENSURE ENLIVE) liquid 237 mL  237 mL Oral Q24H Lala Lund K, MD      . feeding supplement (PRO-STAT SUGAR FREE 64) liquid 30 mL  30 mL Oral TID WC Thurnell Lose, MD   30 mL at 05/31/18 0750  . fluticasone (FLONASE) 50 MCG/ACT nasal spray 1-2 spray  1-2 spray Each Nare Daily Karmen Bongo, MD   1 spray at 05/31/18 0906  . folic acid (FOLVITE) tablet 1 mg  1 mg Oral Daily Lala Lund K, MD   1 mg at 06/01/18 1010  . haloperidol lactate (HALDOL) injection 2 mg  2 mg Intravenous Q6H PRN Thurnell Lose, MD      . hydroxychloroquine (PLAQUENIL) tablet 400 mg  400 mg Oral Daily Karmen Bongo, MD   400 mg at 06/01/18 1010  . leflunomide (ARAVA) tablet 20 mg  20 mg Oral Daily Karmen Bongo, MD   20 mg at 06/01/18 1009  . lisinopril (PRINIVIL,ZESTRIL) tablet 10 mg  10 mg Oral Daily Karmen Bongo, MD   10 mg at 06/01/18 1010  . MEDLINE mouth rinse  15 mL Mouth Rinse q12n4p Karmen Bongo, MD   15 mL at  05/31/18 1245  . metoprolol tartrate (LOPRESSOR) tablet 100 mg  100 mg Oral BID Thurnell Lose, MD   100 mg at 06/01/18 1009  . multivitamin  with minerals tablet 1 tablet  1 tablet Oral Daily Thurnell Lose, MD   1 tablet at 06/01/18 1009  . ondansetron (ZOFRAN) injection 4 mg  4 mg Intravenous Q6H PRN Karmen Bongo, MD      . predniSONE (DELTASONE) tablet 5 mg  5 mg Oral Q breakfast Karmen Bongo, MD   5 mg at 06/01/18 1009  . risperiDONE (RISPERDAL) tablet 0.5 mg  0.5 mg Oral QHS Thurnell Lose, MD   0.5 mg at 05/31/18 2205  . vitamin B-12 (CYANOCOBALAMIN) tablet 1,000 mcg  1,000 mcg Oral Daily Thurnell Lose, MD   1,000 mcg at 06/01/18 1010    Lab Results: No results found for this or any previous visit (from the past 48 hour(s)).  Blood Alcohol level:  Lab Results  Component Value Date   [facility] <10 05/24/2018   ETH <11 02/01/2011    Musculoskeletal: Strength & Muscle Tone: within normal limits Gait & Station: UTA since patient is sitting in a chair.  Patient leans: N/A  Psychiatric Specialty Exam: Physical Exam  Nursing note and vitals reviewed. Constitutional: She is oriented to person, place, and time. She appears well-developed and well-nourished.  HENT:  Head: Normocephalic and atraumatic.  Neck: Normal range of motion.  Respiratory: Effort normal.  Musculoskeletal: Normal range of motion.  Neurological: She is alert and oriented to person, place, and time.  Psychiatric: Her speech is normal and behavior is normal. Judgment normal. Thought content is paranoid and delusional. Cognition and memory are impaired. She exhibits a depressed mood.    Review of Systems  Cardiovascular: Negative for chest pain.  Gastrointestinal: Negative for abdominal pain, diarrhea, nausea and vomiting.  Psychiatric/Behavioral: Positive for depression, hallucinations (AH) and suicidal ideas. Negative for substance abuse. The patient is nervous/anxious and has insomnia.   All other  systems reviewed and are negative.   Blood pressure 134/67, pulse 67, temperature 97.8 F (36.6 C), temperature source Oral, resp. rate 18, height 5\' 4"  (1.626 m), weight 70.3 kg, SpO2 97 %.Body mass index is 26.61 kg/m.  General Appearance: Fairly Groomed, elderly, Caucasian female, wearing a hospital gown with short hair who is sitting in a chair. NAD.   Eye Contact:  Good  Speech:  Clear and Coherent and Normal Rate  Volume:  Normal  Mood:  Depressed  Affect:  Congruent and Constricted  Thought Process:  Goal Directed, Linear and Descriptions of Associations: Intact  Orientation:  Full (Time, Place, and Person)  Thought Content:  Delusions and Paranoid Ideation  Suicidal Thoughts:  Yes.  without intent/plan  Homicidal Thoughts:  No  Memory:  Immediate;   Fair Recent;   Fair Remote;   Fair  Judgement:  Impaired  Insight:  Shallow  Psychomotor Activity:  Normal  Concentration:  Concentration: Good and Attention Span: Good  Recall:  [place] of Knowledge:  Good  Language:  Good  Akathisia:  No  Handed:  Right  AIMS (if indicated):   N/A  Assets:  Communication Skills Housing Social Support  ADL's:  Intact  Cognition: Impaired due to psychiatric condition.   Sleep:   Poor   Assessment:  [name] is a 71 y.o. female who was admitted with AMS. She was found unresponsive at her nursing home secondary to intentional Xanax overdose in a suicide attempt. She reports worsening depressive symptoms for the past few weeks with onset of paranoia and AH several months ago. She also reports problems with sleep. She continues to endorse paranoia  and delusional ideas although she is not forthcoming with this information and it is provided by family. She reports intermittent SI. She continues to warrant inpatient psychiatric hospitalization for stabilization and treatment.    Treatment Plan Summary: -Patient warrants inpatient psychiatric hospitalization given high risk of harm to  self. -Continue bedside sitter.  -Stop Risperdal and start Zyprexa 2.5 qhs for psychosis, poor appetite and insomnia. May increase to 5 mg qhs if tolerated and only partially effective.   -Increase Lexapro 5 mg daily to 10 mg daily for depression.  -Continue Klonopin 1 mg qhs for insomnia/anxiety.  -EKG reviewed and QTc 446. Please closely monitor when starting or increasing QTc prolonging agents.  -Please pursue involuntary commitment if patient refuses voluntary psychiatric hospitalization or attempts to leave the hospital.  -Will sign off on patient at this time. Please consult psychiatry again as needed.    Faythe Dingwall, DO 06/01/2018, 1:43 PM

## 2018-06-01 NOTE — Progress Notes (Signed)
PROGRESS NOTE                                                                                                                                                                                                             Patient Demographics:    [name], is a 72 y.o. female, DOB - 1946/02/06, ZOX:[id number]  Admit date - 05/24/2018   Admitting Physician Karmen Bongo, MD  Outpatient Primary MD for the patient is Leighton Ruff, MD  LOS - 0      Brief Narrative  [name] is a 72 y.o. female with medical history significant of hypothyroidism; HTN; HLD; and DM presenting with AMS. Her sister provided the history, as the patient is obtunded.  She reports that a friend was visiting the patient and that afterwards the friend reported to the nursing staff that she was "not acting quite right."  The nursing staff went to check on the patient and found her unresponsive.  The facility called the sister about 11AM to report that the patient was being transported to [facility].  Since the sister and her husband's arrival, the patient has been sleeping and unresponsive; she "doesn't usually sleep well" so this is unusual for her.  The sister last spoke to her about a week ago.  The facility did report that she apparently didn't feel well yesterday.  She moved into the facility about 3 weeks ago and had significant anxiety leading up to the move.  However, she has seemed to be happy there recently.     Her family does not believe that she would have intentionally taken her medications wrong, but this could have been an inadvertent medication misadventure.  The patient was started on a new medication for anxiety about 3-4 weeks ago, around the time she moved into the facility.    Subjective:    Patient in bed, appears comfortable, denies any headache, no fever, no chest pain or pressure, no shortness of breath , no abdominal pain. No focal weakness.    Assessment  & Plan :       AMS/ ? Underlying undiagnosed Dementia with Pilar Plate delusions - intentional polypharmacy overdose with depressed affect, with attempted suicide.  Her case was discussed with the psychiatrist on 05/26/2018 medication adjustments made she is currently on combination of Lexapro, Klonopin and Risperdal, continue Air cabin crew.  Unremarkable CT head, stable MRI brain, stable B12 TSH and RPR.  Medically cleared await inpatient psych bed as requested by psychiatrist.   Depression -  She was last seen at [facility] on 11/4, see above.   HTN - continue home dose ACE inhibitor, increased beta-blocker for better control.  HLD - Continue Lipitor  RA  - she is on a large number of DMARD medications - causing her to be chronically immunosuppressed. Resumed - Prednisone, Plaquenil, Arava,. Also on weekly methotrexate and biweekly Humira may resume them on discharge.   Hypokalemia.  Replaced will recheck tomorrow.    Mild edema.  Placed on TED stockings and Lasix, frequently refusing medications.    Stress related hyperglycemia.  Stable A1c.  Lab Results  Component Value Date   HGBA1C 5.3 05/25/2018     Family Communication  :  none  Code Status :  Full  Disposition Plan  : Psych medically stable for discharge  Consults  :  Psych  Procedures  :    CT head - Non acute  MRI nonacute.  DVT Prophylaxis  :  Lovenox   Lab Results  Component Value Date   PLT 124 (L) 05/26/2018    Diet :  Diet Order            Diet heart healthy/carb modified Room service appropriate? Yes; Fluid consistency: Thin  Diet effective now               Inpatient Medications Scheduled Meds: . aspirin EC  81 mg Oral Daily  . atorvastatin  40 mg Oral Daily  . chlorhexidine  15 mL Mouth Rinse BID  . cholecalciferol  2,000 Units Oral Daily  . clonazePAM  1 mg Oral QHS  . cycloSPORINE  1 drop Both Eyes BID  . enoxaparin (LOVENOX) injection  40 mg Subcutaneous Q24H  . escitalopram  5 mg Oral Daily  .  feeding supplement (ENSURE ENLIVE)  237 mL Oral BID BM  . feeding supplement (PRO-STAT SUGAR FREE 64)  30 mL Oral TID WC  . fluticasone  1-2 spray Each Nare Daily  . folic acid  1 mg Oral Daily  . hydroxychloroquine  400 mg Oral Daily  . leflunomide  20 mg Oral Daily  . lisinopril  10 mg Oral Daily  . mouth rinse  15 mL Mouth Rinse q12n4p  . metoprolol tartrate  100 mg Oral BID  . multivitamin with minerals  1 tablet Oral Daily  . predniSONE  5 mg Oral Q breakfast  . risperiDONE  0.5 mg Oral QHS  . vitamin B-12  1,000 mcg Oral Daily   Continuous Infusions:  PRN Meds:.acetaminophen, haloperidol lactate, [DISCONTINUED] ondansetron **OR** ondansetron (ZOFRAN) IV  Antibiotics  :   Anti-infectives (From admission, onward)   Start     Dose/Rate Route Frequency Ordered Stop   05/25/18 1000  hydroxychloroquine (PLAQUENIL) tablet 400 mg     400 mg Oral Daily 05/24/18 1405     05/25/18 0000  ceFEPIme (MAXIPIME) 2 g in sodium chloride 0.9 % 100 mL IVPB  Status:  Discontinued     2 g 200 mL/hr over 30 Minutes Intravenous Every 12 hours 05/24/18 1302 05/24/18 1405   05/25/18 0000  vancomycin (VANCOCIN) IVPB 750 mg/150 ml premix  Status:  Discontinued     750 mg 150 mL/hr over 60 Minutes Intravenous Every 12 hours 05/24/18 1302 05/24/18 1405   05/24/18 1145  ceFEPIme (MAXIPIME) 2 g in sodium chloride 0.9 % 100 mL IVPB     2 g 200 mL/hr over 30 Minutes Intravenous  Once 05/24/18 1138 05/24/18 1221   05/24/18 1145  metroNIDAZOLE (FLAGYL) IVPB 500  mg  Status:  Discontinued     500 mg 100 mL/hr over 60 Minutes Intravenous Every 8 hours 05/24/18 1138 05/24/18 1405   05/24/18 1145  vancomycin (VANCOCIN) IVPB 1000 mg/200 mL premix     1,000 mg 200 mL/hr over 60 Minutes Intravenous  Once 05/24/18 1138 05/24/18 1531          Objective:   Vitals:   05/31/18 0547 05/31/18 1508 05/31/18 2103 06/01/18 0521  BP: (!) 116/56 105/66 (!) 116/55 134/67  Pulse: 85 77 78 67  Resp: 20 18 18    Temp:  98.7 F (37.1 C) 97.8 F (36.6 C) 98.6 F (37 C) 97.8 F (36.6 C)  TempSrc: Oral  Oral Oral  SpO2: 95% 100% 97% 97%  Weight:      Height:        Wt Readings from Last 3 Encounters:  05/24/18 70.3 kg  10/07/16 74.8 kg  05/13/16 73.5 kg     Intake/Output Summary (Last 24 hours) at 06/01/2018 0944 Last data filed at 05/31/2018 1900 Gross per 24 hour  Intake 0 ml  Output -  Net 0 ml     Physical Exam  Awake Alert, anxious affect but no focal deficits, does have some delusions that people are stealing her information [place].AT,PERRAL Supple Neck,No JVD, No cervical lymphadenopathy appriciated.  Symmetrical Chest wall movement, Good air movement bilaterally, CTAB RRR,No Gallops, Rubs or new Murmurs, No Parasternal Heave +ve B.Sounds, Abd Soft, No tenderness, No organomegaly appriciated, No rebound - guarding or rigidity. No Cyanosis, Clubbing or edema, No new Rash or bruise    Data Review:    CBC Recent Labs  Lab 05/25/18 1125 05/26/18 0325  WBC  --  3.5*  HGB  --  10.8*  HCT 34.7 35.2*  PLT  --  124*  MCV  --  103.8*  MCH  --  31.9  MCHC  --  30.7  RDW  --  14.0    Chemistries  Recent Labs  Lab 05/26/18 0325 05/29/18 0310 05/30/18 0420  NA 139  --  140  K 3.4* 3.9 3.8  CL 108  --  103  CO2 25  --  28  GLUCOSE 82  --  102*  BUN 15  --  17  CREATININE 0.71  --  0.86  CALCIUM 8.6*  --  9.1  MG 2.0  --   --    ------------------------------------------------------------------------------------------------------------------ No results for input(s): CHOL, HDL, LDLCALC, TRIG, CHOLHDL, LDLDIRECT in the last 72 hours.  Lab Results  Component Value Date   HGBA1C 5.3 05/25/2018   ------------------------------------------------------------------------------------------------------------------ No results for input(s): TSH, T4TOTAL, T3FREE, THYROIDAB in the last 72 hours.  Invalid input(s):  FREET3 ------------------------------------------------------------------------------------------------------------------ No results for input(s): VITAMINB12, FOLATE, FERRITIN, TIBC, IRON, RETICCTPCT in the last 72 hours.  Coagulation profile No results for input(s): INR, PROTIME in the last 168 hours.  No results for input(s): DDIMER in the last 72 hours.  Cardiac Enzymes No results for input(s): CKMB, TROPONINI, MYOGLOBIN in the last 168 hours.  Invalid input(s): CK ------------------------------------------------------------------------------------------------------------------ No results found for: BNP  Micro Results Recent Results (from the past 240 hour(s))  Urine culture     Status: None   Collection Time: 05/24/18 11:17 AM  Result Value Ref Range Status   Specimen Description IN/OUT CATH URINE  Final   Special Requests NONE  Final   Culture   Final    NO GROWTH Performed at [facility], [address] [place], [place]  [address]    Report Status 05/25/2018 FINAL  Final  MRSA PCR Screening     Status: None   Collection Time: 05/24/18  4:50 PM  Result Value Ref Range Status   MRSA by PCR NEGATIVE NEGATIVE Final    Comment:        The GeneXpert MRSA Assay (FDA approved for NASAL specimens only), is one component of a comprehensive MRSA colonization surveillance program. It is not intended to diagnose MRSA infection nor to guide or monitor treatment for MRSA infections. Performed at [facility], [place] [address] [place], [place] [address]     Radiology Reports Ct Head Wo Contrast  Result Date: 05/24/2018 CLINICAL DATA:  72 year old female found down at home. Unresponsive. Initial encounter. EXAM: CT HEAD WITHOUT CONTRAST TECHNIQUE: Contiguous axial images were obtained from the base of the skull through the vertex without intravenous contrast. COMPARISON:  01/31/2008 head CT. FINDINGS: Brain: Significant streak artifact through posterior fossa.  Taking this limitation into account, no intracranial hemorrhage or CT evidence of large acute infarct noted. Mild global atrophy most notable parietal lobes. No intracranial mass lesion detected on this unenhanced exam. Vascular: No acute hyperdense vessel. Atherosclerotic changes cavernous segment carotid arteries. Skull: No skull fracture. Sinuses/Orbits: No acute orbital abnormality. Visualized paranasal sinuses, mastoid air cells and middle ear cavities are clear. Other: Nasal trumpet is in place. IMPRESSION: 1. Significant streak artifact through posterior fossa. Taking this limitation into account, no intracranial hemorrhage or CT evidence of large acute infarct noted. 2. Mild global atrophy most notable parietal lobes. Electronically Signed   By: Genia Del M.D.   On: 05/24/2018 11:45   Mr Brain Wo Contrast  Result Date: 05/25/2018 CLINICAL DATA:  Altered mental status EXAM: MRI HEAD WITHOUT CONTRAST TECHNIQUE: Multiplanar, multiecho pulse sequences of the brain and surrounding structures were obtained without intravenous contrast. COMPARISON:  None. FINDINGS: BRAIN: There is no acute infarct, acute hemorrhage, hydrocephalus or extra-axial collection. The midline structures are normal. No midline shift or other mass effect. There are no old infarcts. Multifocal white matter hyperintensity, most commonly due to chronic ischemic microangiopathy. The cerebral and cerebellar volume are age-appropriate. Susceptibility-sensitive sequences show no chronic microhemorrhage or superficial siderosis. VASCULAR: Major intracranial arterial and venous sinus flow voids are normal. SKULL AND UPPER CERVICAL SPINE: Calvarial bone marrow signal is normal. There is no skull base mass. Visualized upper cervical spine and soft tissues are normal. SINUSES/ORBITS: No fluid levels or advanced mucosal thickening. No mastoid or middle ear effusion. The orbits are normal. IMPRESSION: Chronic microvascular disease without acute  intracranial abnormality. Electronically Signed   By: Ulyses Jarred M.D.   On: 05/25/2018 19:02   Dg Chest Port 1 View  Result Date: 05/24/2018 CLINICAL DATA:  Altered mental status EXAM: PORTABLE CHEST 1 VIEW COMPARISON:  12/11/2016 FINDINGS: Low lung volumes with bibasilar atelectasis. Heart is upper limits normal in size. No effusions or acute bony abnormality. IMPRESSION: Low lung volumes, bibasilar atelectasis. Electronically Signed   By: Rolm Baptise M.D.   On: 05/24/2018 11:52    Time Spent in minutes  30   Lala Lund M.D on 06/01/2018 at 9:44 AM  To page go to [email or web address] - password [facility]

## 2018-06-01 NOTE — Progress Notes (Signed)
Pt alert this am, vss, pt states that "the staff is trying to keep me in the room and watch me die." pt took 75% of her medications then placed the remaining meds on her table and said" I'm done taking these, they are not even my meds to begin with" this nurse picked up the medication and placed in the sharps container. Pt has a flat affect and does not interact in conversation. Will ctm

## 2018-06-01 NOTE — Progress Notes (Signed)
Nutrition Follow-up  INTERVENTION:   - Ensure Enlive po daily, each supplement provides 350 kcal and 20 grams of protein  - Continue 30 ml Prostat TID, each supplement provides 100 kcals and 15 grams protein  - Magic cup BID with meals, each supplement provides 290 kcal and 9 grams of protein  - Continue MVI with minerals daily  NUTRITION DIAGNOSIS:   Inadequate oral intake related to decreased appetite, social / environmental circumstances (pt has depression) as evidenced by per patient/family report.  Ongoing  GOAL:   Patient will meet greater than or equal to 90% of their needs  Progressing  MONITOR:   Diet advancement, PO intake, Supplement acceptance, Weight trends, Labs  REASON FOR ASSESSMENT:   Malnutrition Screening Tool    ASSESSMENT:   [name] is a 72 yo female with PMH of hypothyroidism, HTN, HLD, T2DM, who is admitted from independent living facility for evaluation of altered mental status (AMS).   Pt is awaiting inpatient psych bed at this time and is medically stable for d/c. Noted pt has recently been refusing medications and having delusions.  Spoke with pt at bedside. Pt with very flat affect and did not participate much in conversations.  Pt states that she is not eating much "because I do not want to eat in here." Pt endorses a decreased appetite. Pt states that she does not like the Ensure Enlive oral nutrition supplements but is willing to drink them. RD to decrease Ensure Enlive to once daily and order Magic Cup BID with meal trays.  Pt states she ate "bites" of [place] toast and oatmeal with raisins this morning but is not sure what else she ate. Pt states that she is only drinking coffee and that she is not drinking water. Suspect continued poor PO intake and disinterest in eating are related to pt's depression.  Meal Completion: 0-75% x last 8 meals  Medications reviewed and include: cholecalciferol, Ensure Enlive BID (pt refusing ~50% of  the times offered), Pro-stat TID (pt refusing ~50% of the times offered), folic acid, MVI, vitamin B-12  Labs reviewed.  Diet Order:   Diet Order            Diet heart healthy/carb modified Room service appropriate? Yes; Fluid consistency: Thin  Diet effective now              EDUCATION NEEDS:   Education needs have been addressed  Skin:  Skin Assessment: Reviewed RN Assessment  Last BM:  11/18  Height:   Ht Readings from Last 1 Encounters:  05/24/18 5\' 4"  (1.626 m)    Weight:   Wt Readings from Last 1 Encounters:  05/24/18 70.3 kg    Ideal Body Weight:  54.5 kg  BMI:  Body mass index is 26.61 kg/m.  Estimated Nutritional Needs:   Kcal:  1500-1700  Protein:  75-85 gm  Fluid:  >/=1.5 L    Gaynell Face, MS, RD, LDN Inpatient Clinical Dietitian Pager: [phone] Weekend/After Hours: [phone]

## 2018-06-01 NOTE — Progress Notes (Signed)
[facility] contacted CSW regarding referral. They are requesting updated EKG, Chest x ray, CMC/CMP/UA, and psych eval. Will alert MD.  Cedric Fishman LCSW [phone]

## 2018-06-02 DIAGNOSIS — Z7982 Long term (current) use of aspirin: Secondary | ICD-10-CM | POA: Diagnosis not present

## 2018-06-02 DIAGNOSIS — E785 Hyperlipidemia, unspecified: Secondary | ICD-10-CM | POA: Diagnosis not present

## 2018-06-02 DIAGNOSIS — E1159 Type 2 diabetes mellitus with other circulatory complications: Secondary | ICD-10-CM | POA: Diagnosis not present

## 2018-06-02 DIAGNOSIS — R4182 Altered mental status, unspecified: Secondary | ICD-10-CM | POA: Diagnosis not present

## 2018-06-02 DIAGNOSIS — F323 Major depressive disorder, single episode, severe with psychotic features: Secondary | ICD-10-CM | POA: Diagnosis not present

## 2018-06-02 DIAGNOSIS — E1169 Type 2 diabetes mellitus with other specified complication: Secondary | ICD-10-CM | POA: Diagnosis not present

## 2018-06-02 DIAGNOSIS — E039 Hypothyroidism, unspecified: Secondary | ICD-10-CM | POA: Diagnosis not present

## 2018-06-02 DIAGNOSIS — Z9049 Acquired absence of other specified parts of digestive tract: Secondary | ICD-10-CM | POA: Diagnosis not present

## 2018-06-02 DIAGNOSIS — Z888 Allergy status to other drugs, medicaments and biological substances status: Secondary | ICD-10-CM | POA: Diagnosis not present

## 2018-06-02 DIAGNOSIS — F419 Anxiety disorder, unspecified: Secondary | ICD-10-CM | POA: Diagnosis not present

## 2018-06-02 DIAGNOSIS — R262 Difficulty in walking, not elsewhere classified: Secondary | ICD-10-CM | POA: Diagnosis not present

## 2018-06-02 DIAGNOSIS — G4700 Insomnia, unspecified: Secondary | ICD-10-CM | POA: Diagnosis not present

## 2018-06-02 DIAGNOSIS — M069 Rheumatoid arthritis, unspecified: Secondary | ICD-10-CM | POA: Diagnosis not present

## 2018-06-02 DIAGNOSIS — M6281 Muscle weakness (generalized): Secondary | ICD-10-CM | POA: Diagnosis not present

## 2018-06-02 DIAGNOSIS — Z79899 Other long term (current) drug therapy: Secondary | ICD-10-CM | POA: Diagnosis not present

## 2018-06-02 DIAGNOSIS — Z90710 Acquired absence of both cervix and uterus: Secondary | ICD-10-CM | POA: Diagnosis not present

## 2018-06-02 DIAGNOSIS — F29 Unspecified psychosis not due to a substance or known physiological condition: Secondary | ICD-10-CM | POA: Diagnosis not present

## 2018-06-02 DIAGNOSIS — I10 Essential (primary) hypertension: Secondary | ICD-10-CM | POA: Diagnosis not present

## 2018-06-02 DIAGNOSIS — R2681 Unsteadiness on feet: Secondary | ICD-10-CM | POA: Diagnosis not present

## 2018-06-02 DIAGNOSIS — F333 Major depressive disorder, recurrent, severe with psychotic symptoms: Secondary | ICD-10-CM | POA: Diagnosis not present

## 2018-06-02 DIAGNOSIS — R824 Acetonuria: Secondary | ICD-10-CM | POA: Diagnosis not present

## 2018-06-02 DIAGNOSIS — E119 Type 2 diabetes mellitus without complications: Secondary | ICD-10-CM | POA: Diagnosis not present

## 2018-06-02 DIAGNOSIS — F339 Major depressive disorder, recurrent, unspecified: Secondary | ICD-10-CM | POA: Diagnosis not present

## 2018-06-02 LAB — BASIC METABOLIC PANEL
ANION GAP: 11 (ref 5–15)
BUN: 27 mg/dL — ABNORMAL HIGH (ref 8–23)
CALCIUM: 9 mg/dL (ref 8.9–10.3)
CO2: 25 mmol/L (ref 22–32)
CREATININE: 0.97 mg/dL (ref 0.44–1.00)
Chloride: 101 mmol/L (ref 98–111)
GFR calc Af Amer: >60 mL/min (ref >60)
GFR calc non Af Amer: 57 mL/min — ABNORMAL LOW (ref >60)
Glucose, Bld: 93 mg/dL (ref 70–99)
Potassium: 3.9 mmol/L (ref 3.5–5.1)
SODIUM: 137 mmol/L (ref 135–145)

## 2018-06-02 LAB — CBC
HCT: 37.5 % (ref 36.0–46.0)
Hemoglobin: 11.8 g/dL — ABNORMAL LOW (ref 12.0–15.0)
MCH: 32.2 pg (ref 26.0–34.0)
MCHC: 31.5 g/dL (ref 30.0–36.0)
MCV: 102.2 fL — ABNORMAL HIGH (ref 80.0–100.0)
PLATELETS: 120 10*3/uL — AB (ref 150–400)
RBC: 3.67 MIL/uL — ABNORMAL LOW (ref 3.87–5.11)
RDW: 14 % (ref 11.5–15.5)
WBC: 7.8 10*3/uL (ref 4.0–10.5)
nRBC: 0 % (ref 0.0–0.2)

## 2018-06-02 LAB — MAGNESIUM: MAGNESIUM: 2.4 mg/dL (ref 1.7–2.4)

## 2018-06-02 MED ORDER — ACETAMINOPHEN 325 MG PO TABS: 650.0000 mg | ORAL_TABLET | Freq: Four times a day (QID) | ORAL | 1 refills | Status: DC | PRN

## 2018-06-02 MED ORDER — OLANZAPINE 5 MG PO TBDP: 2.5000 mg | ORAL_TABLET | Freq: Every day | ORAL | 2 refills | Status: DC

## 2018-06-02 MED ORDER — CLONAZEPAM 1 MG PO TABS: 1.0000 mg | ORAL_TABLET | Freq: Every day | ORAL | 0 refills | Status: DC

## 2018-06-02 MED ORDER — ADULT MULTIVITAMIN W/MINERALS CH: 1.0000 | ORAL_TABLET | Freq: Every day | ORAL | 3 refills | Status: DC

## 2018-06-02 MED ORDER — ENSURE ENLIVE PO LIQD: 237.0000 mL | ORAL | 12 refills | Status: DC

## 2018-06-02 MED ORDER — ESCITALOPRAM OXALATE 10 MG PO TABS: 10.0000 mg | ORAL_TABLET | Freq: Every day | ORAL | 0 refills | Status: DC

## 2018-06-02 NOTE — Progress Notes (Signed)
Physical Therapy Treatment Patient Details Name: [name] MRN: [id number] DOB: [birth year] Today's Date: 06/02/2018    History of Present Illness [name] is a 72 y.o. female with medical history significant of hypothyroidism; HTN; HLD; and DM presenting with AMS.    PT Comments    Pt admitted with above diagnosis. Pt currently with functional limitations due to balance deficits as well as cognitive issues. Pt was able to ambulate in hallways without physical assist and steady gait.  Did engage in conversation about places she likes to eat.  Pt frequency decr to 2xweek as pt is making progress and does not need a lot of physical assist but does not allow challenges to balance and it is difficult to perform skilled PT with this self limiting pt.  Will continue to attempt treatment at least 2 more x and if pt continues to not allow challenges to balance, may be at baseline status for now.   Pt will benefit from skilled PT to increase their independence and safety with mobility to allow discharge to the venue listed below.     Follow Up Recommendations  Other (comment)(inpatient psychiatric hospital)     Equipment Recommendations  Rolling walker with 5" wheels    Recommendations for Other Services OT consult     Precautions / Restrictions Precautions Precautions: Fall;Other (comment) Restrictions Weight Bearing Restrictions: No    Mobility  Bed Mobility Overal bed mobility: Needs Assistance Bed Mobility: Supine to Sit     Supine to sit: Supervision     General bed mobility comments: sittng EOB on arrival  Transfers Overall transfer level: Needs assistance Equipment used: None Transfers: Sit to/from Stand Sit to Stand: Supervision            Ambulation/Gait Ambulation/Gait assistance: Min guard;Supervision Gait Distance (Feet): 450 Feet Assistive device: None Gait Pattern/deviations: Step-through pattern;Decreased stride length;Drifts  right/left Gait velocity: decreased Gait velocity interpretation: <1.8 ft/sec, indicate of risk for recurrent falls General Gait Details: ambulated no device with min guard at times and pt utilizing wall rail less than last visit, but more limited and slow due to lack of direction despite cues, hesitant and anxious.  Pt did not significantly lose balance while ambulating but did not challenge due to pt cognition and paranoia.     Stairs             Wheelchair Mobility    Modified Rankin (Stroke Patients Only)       Balance Overall balance assessment: Needs assistance Sitting-balance support: No upper extremity supported Sitting balance-Leahy Scale: Good     Standing balance support: No upper extremity supported;During functional activity Standing balance-Leahy Scale: Good Standing balance comment: static balance good               High Level Balance Comments: attempted to engage pt in side stepping and other balance activities, she declined despite encouragement            Cognition Arousal/Alertness: Awake/alert Behavior During Therapy: Anxious;Restless Overall Cognitive Status: Impaired/Different from baseline Area of Impairment: Orientation;Memory;Safety/judgement;Problem solving                 Orientation Level: Situation;Time;Disoriented to   Memory: Decreased short-term memory   Safety/Judgement: Decreased awareness of safety;Decreased awareness of deficits   Problem Solving: Slow processing;Decreased initiation;Requires verbal cues;Requires tactile cues General Comments: Paranoid and anxious      Exercises      General Comments General comments (skin integrity, edema, etc.): Pt asked to wash  her hair, PT obtained shower cap however pt refused to use it once PT brought it back to room.       Pertinent Vitals/Pain Pain Assessment: No/denies pain    Home Living                      Prior Function            PT Goals  (current goals can now be found in the care plan section) Acute Rehab PT Goals Patient Stated Goal: none stated Progress towards PT goals: Progressing toward goals    Frequency    Min 2X/week      PT Plan Frequency needs to be updated    Co-evaluation              AM-PAC PT "6 Clicks" Daily Activity  Outcome Measure  Difficulty turning over in bed (including adjusting bedclothes, sheets and blankets)?: A Little Difficulty moving from lying on back to sitting on the side of the bed? : A Little Difficulty sitting down on and standing up from a chair with arms (e.g., wheelchair, bedside commode, etc,.)?: A Little Help needed moving to and from a bed to chair (including a wheelchair)?: A Little Help needed walking in hospital room?: A Little Help needed climbing 3-5 steps with a railing? : A Little 6 Click Score: 18    End of Session Equipment Utilized During Treatment: Gait belt Activity Tolerance: Patient tolerated treatment well(self limiting) Patient left: with call bell/phone within reach;with nursing/sitter in room;in bed Nurse Communication: Mobility status PT Visit Diagnosis: Unsteadiness on feet (R26.81);Muscle weakness (generalized) (M62.81);Difficulty in walking, not elsewhere classified (R26.2)     Time: 1212-1223 PT Time Calculation (min) (ACUTE ONLY): 11 min  Charges:  $Gait Training: 8-22 mins                     Paul Smiths Pager:  [phone]  Office:  [place] 06/02/2018, 2:17 PM

## 2018-06-02 NOTE — Discharge Summary (Signed)
[name], is a 72 y.o. female  DOB 05-Aug-1945  MRN [id number].  Admission date:  05/24/2018  Admitting Physician  Karmen Bongo, MD  Discharge Date:  06/02/2018   Primary MD  Leighton Ruff, MD  Recommendations for primary care physician for things to follow:   Transfer to [facility]-   Admission Diagnosis  Altered mental status, unspecified altered mental status type [R41.82]   Discharge Diagnosis  Altered mental status, unspecified altered mental status type [R41.82]   Principal Problem:   MDD (major depressive disorder), recurrent, severe, with psychosis ([place]) Active Problems:   Type 2 diabetes mellitus without complication ([place])   Rheumatoid arthritis ([place])   Hypertension   Hyperlipidemia   Depression   Altered mental status   Ketonuria      Past Medical History:  Diagnosis Date  . Anxiety   . Arthritis    rhematoid,osteoarthritis  . Diabetes mellitus without complication ([place])   . Elevated cholesterol   . Hemorrhoids   . Hypertension   . Hypothyroidism   . Joint pain   . Nocturia   . Sleeping difficulties     Past Surgical History:  Procedure Laterality Date  . ABDOMINAL HYSTERECTOMY    . BACK SURGERY    . CERVICAL FUSION  2004  . CHOLECYSTECTOMY    . EYE SURGERY Left    cataract  . PARS PLANA REPAIR OF RETINAL DEATACHMENT    . THYROIDECTOMY, PARTIAL         HPI  from the history and physical done on the day of admission:    Patient coming from: [facility] [address]; NOK: Sister, [phone]  Chief Complaint: AMS  HPI: [name] is a 72 y.o. female with medical history significant of hypothyroidism; HTN; HLD; and DM presenting with AMS. Her sister provided the history, as the patient is obtunded.  She reports that a friend was visiting the patient and that afterwards the friend reported to the nursing  staff that she was "not acting quite right."  The nursing staff went to check on the patient and found her unresponsive.  The facility called the sister about 11AM to report that the patient was being transported to [facility].  Since the sister and her husband's arrival, the patient has been sleeping and unresponsive; she "doesn't usually sleep well" so this is unusual for her.  The sister last spoke to her about a week ago.  The facility did report that she apparently didn't feel well yesterday.  She moved into the facility about 3 weeks ago and had significant anxiety leading up to the move.  However, she has seemed to be happy there recently.  Her family does not believe that she would have intentionally taken her medications wrong, but this could have been an inadvertent medication misadventure.  The patient was started on a new medication for anxiety about 3-4 weeks ago, around the time she moved into the facility.   ED Course:   AMS - looks dry, no apparent  source.  ?medication misadventure vs. Intentional self-harm.  Sister is present, hasn't seen her for 4 days.  Treated for possible infection due to RA, immunosuppression.  Awakens to stimuli with mumbling speech.      Hospital Course:     Brief Summary 72 y.o.femalewith medical history significant ofhypothyroidism; HTN; HLD; and DM presenting with AMS, was apparently found obtunded/unresponsive by staff at their facility, psychiatrist recommends inpatient psychiatric management due to concerns about intentional polypharmacy/overdose consistent with possible suicide attempt with depressed affect   Plan:: 1)RA---on DMARD- PTA was on weekly methotrexate , biweekly Humira, continue prednisone, Plaquenil and Arava, continue folic acid  2)Depression/Anxiety--- intentional polypharmacy/overdose consistent with possible suicide attempt with depressed affect, psychiatric consult appreciated, psychiatrist recommends inpatient psychiatric  evaluation and  Treatment, continue clonazepam 1 mg nightly,, Haldol as needed agitation, Psychiatrist  recommendszyprexa 2.5 mg nightly and Lexapro 10 mg daily, continue one-to-one suicide watch  3)HTN--stable, continue lisinopril 10 mg daily, and metoprolol XL 25 mg twice daily,  due to strong association between depression and beta-blockers avoid high-dose beta-blockers  4)HLD--okay to continue Lipitor and aspirin  Discharge Condition: stable  Consults obtained - Psych  Diet and Activity recommendation:  As advised  Discharge Instructions    Discharge Instructions    Call MD for:  persistant dizziness or light-headedness   Complete by:  As directed    Call MD for:  persistant nausea and vomiting   Complete by:  As directed    Call MD for:  temperature >100.4   Complete by:  As directed    Diet - low sodium heart healthy   Complete by:  As directed    Discharge instructions   Complete by:  As directed    Transfer to [facility]   Increase activity slowly   Complete by:  As directed         Discharge Medications     Allergies as of 06/02/2018      Reactions   Contrast Media [iodinated Diagnostic Agents] Anaphylaxis, Hives   Only the retina dye;    Abilify [aripiprazole]    Ultram [tramadol Hcl]    Zithromax [azithromycin]       Medication List    STOP taking these medications   ALPRAZolam 0.5 MG tablet Commonly known as:  XANAX   ARIPiprazole 10 MG tablet Commonly known as:  ABILIFY   PARoxetine 40 MG tablet Commonly known as:  PAXIL     TAKE these medications   ACCU-CHEK FASTCLIX LANCETS Misc CHECK BLOOD SUGAR ONCE A DAY   ACCU-CHEK GUIDE test strip Generic drug:  glucose blood CHECK BLOOD SUGAR ONCE A DAY   acetaminophen 325 MG tablet Commonly known as:  TYLENOL Take 2 tablets (650 mg total) by mouth every 6 (six) hours as needed for moderate pain (headache).   albuterol 108 (90 Base) MCG/ACT inhaler Commonly known as:  PROVENTIL  HFA;VENTOLIN HFA Inhale 1-2 puffs into the lungs every 6 (six) hours as needed for wheezing or shortness of breath.   aspirin EC 81 MG tablet Take 81 mg by mouth daily.   atorvastatin 40 MG tablet Commonly known as:  LIPITOR Take 40 mg by mouth daily.   cetirizine 10 MG tablet Commonly known as:  ZYRTEC Take 10 mg by mouth daily as needed for allergies.   clonazePAM 1 MG tablet Commonly known as:  KLONOPIN Take 1 tablet (1 mg total) by mouth at bedtime.   cycloSPORINE 0.05 % ophthalmic emulsion Commonly known as:  RESTASIS  Place 1 drop into both eyes 2 (two) times daily.   escitalopram 10 MG tablet Commonly known as:  LEXAPRO Take 1 tablet (10 mg total) by mouth daily. Start taking on:  06/03/2018   feeding supplement (ENSURE ENLIVE) Liqd Take 237 mLs by mouth daily. Start taking on:  06/03/2018   ferrous sulfate 325 (65 FE) MG EC tablet Take 325 mg by mouth 3 (three) times daily with meals.   fluticasone 50 MCG/ACT nasal spray Commonly known as:  FLONASE Place 1-2 sprays into both nostrils daily.   folic acid 1 MG tablet Commonly known as:  FOLVITE Take 1 mg by mouth daily.   HUMIRA PEDIATRIC CROHNS START 40 MG/0.8ML Pskt Generic drug:  Adalimumab Inject 1 each into the skin every 14 (fourteen) days.   hydroxychloroquine 200 MG tablet Commonly known as:  PLAQUENIL Take 400 mg by mouth daily.   leflunomide 20 MG tablet Commonly known as:  ARAVA Take 20 mg by mouth daily.   lisinopril 10 MG tablet Commonly known as:  PRINIVIL,ZESTRIL Take 10 mg by mouth daily.   methotrexate 2.5 MG tablet Commonly known as:  RHEUMATREX 15 mg once a week. Wednesday   metoprolol succinate 25 MG 24 hr tablet Commonly known as:  TOPROL-XL Take 25 mg by mouth daily.   multivitamin with minerals Tabs tablet Take 1 tablet by mouth daily. Start taking on:  06/03/2018   OLANZapine zydis 5 MG disintegrating tablet Commonly known as:  ZYPREXA Take 0.5 tablets (2.5 mg total)  by mouth at bedtime.   predniSONE 5 MG tablet Commonly known as:  DELTASONE Take 5 mg by mouth daily with breakfast.   vitamin B-12 1000 MCG tablet Commonly known as:  CYANOCOBALAMIN Take 1,000 mcg by mouth daily.   Vitamin D 50 MCG (2000 UT) tablet Take 2,000 Units by mouth daily.   VITAMIN D2 PO Take 2,000 Units by mouth daily.       Major procedures and Radiology Reports - PLEASE review detailed and final reports for all details, in brief -    Ct Head Wo Contrast  Result Date: 05/24/2018 CLINICAL DATA:  72 year old female found down at home. Unresponsive. Initial encounter. EXAM: CT HEAD WITHOUT CONTRAST TECHNIQUE: Contiguous axial images were obtained from the base of the skull through the vertex without intravenous contrast. COMPARISON:  01/31/2008 head CT. FINDINGS: Brain: Significant streak artifact through posterior fossa. Taking this limitation into account, no intracranial hemorrhage or CT evidence of large acute infarct noted. Mild global atrophy most notable parietal lobes. No intracranial mass lesion detected on this unenhanced exam. Vascular: No acute hyperdense vessel. Atherosclerotic changes cavernous segment carotid arteries. Skull: No skull fracture. Sinuses/Orbits: No acute orbital abnormality. Visualized paranasal sinuses, mastoid air cells and middle ear cavities are clear. Other: Nasal trumpet is in place. IMPRESSION: 1. Significant streak artifact through posterior fossa. Taking this limitation into account, no intracranial hemorrhage or CT evidence of large acute infarct noted. 2. Mild global atrophy most notable parietal lobes. Electronically Signed   By: Genia Del M.D.   On: 05/24/2018 11:45   Mr Brain Wo Contrast  Result Date: 05/25/2018 CLINICAL DATA:  Altered mental status EXAM: MRI HEAD WITHOUT CONTRAST TECHNIQUE: Multiplanar, multiecho pulse sequences of the brain and surrounding structures were obtained without intravenous contrast. COMPARISON:  None.  FINDINGS: BRAIN: There is no acute infarct, acute hemorrhage, hydrocephalus or extra-axial collection. The midline structures are normal. No midline shift or other mass effect. There are no old infarcts. Multifocal white matter  hyperintensity, most commonly due to chronic ischemic microangiopathy. The cerebral and cerebellar volume are age-appropriate. Susceptibility-sensitive sequences show no chronic microhemorrhage or superficial siderosis. VASCULAR: Major intracranial arterial and venous sinus flow voids are normal. SKULL AND UPPER CERVICAL SPINE: Calvarial bone marrow signal is normal. There is no skull base mass. Visualized upper cervical spine and soft tissues are normal. SINUSES/ORBITS: No fluid levels or advanced mucosal thickening. No mastoid or middle ear effusion. The orbits are normal. IMPRESSION: Chronic microvascular disease without acute intracranial abnormality. Electronically Signed   By: Ulyses Jarred M.D.   On: 05/25/2018 19:02   Dg Chest Port 1 View  Result Date: 06/01/2018 CLINICAL DATA:  Shortness of breath. EXAM: PORTABLE CHEST 1 VIEW COMPARISON:  05/24/2018. FINDINGS: Mediastinum and hilar structures normal. Low lung volumes with mild bibasilar atelectasis. No pleural effusion or pneumothorax. Surgical clip noted over the neck. Prior cervical spine fusion. Degenerative change thoracic spine. IMPRESSION: Low lung volumes with mild bibasilar atelectasis. Electronically Signed   By: Marcello Moores  Register   On: 06/01/2018 11:52   Dg Chest Port 1 View  Result Date: 05/24/2018 CLINICAL DATA:  Altered mental status EXAM: PORTABLE CHEST 1 VIEW COMPARISON:  12/11/2016 FINDINGS: Low lung volumes with bibasilar atelectasis. Heart is upper limits normal in size. No effusions or acute bony abnormality. IMPRESSION: Low lung volumes, bibasilar atelectasis. Electronically Signed   By: Rolm Baptise M.D.   On: 05/24/2018 11:52    Micro Results   Recent Results (from the past 240 hour(s))  Urine  culture     Status: None   Collection Time: 05/24/18 11:17 AM  Result Value Ref Range Status   Specimen Description IN/OUT CATH URINE  Final   Special Requests NONE  Final   Culture   Final    NO GROWTH Performed at [facility], [address], [place], [place] [address]    Report Status 05/25/2018 FINAL  Final  MRSA PCR Screening     Status: None   Collection Time: 05/24/18  4:50 PM  Result Value Ref Range Status   MRSA by PCR NEGATIVE NEGATIVE Final    Comment:        The GeneXpert MRSA Assay (FDA approved for NASAL specimens only), is one component of a comprehensive MRSA colonization surveillance program. It is not intended to diagnose MRSA infection nor to guide or monitor treatment for MRSA infections. Performed at [facility], [place] [address] [place], [place] [address]      Today   Subjective    [name] today has no new complaints,        Patient has been seen and examined prior to discharge   Objective   Blood pressure (!) 103/44, pulse 91, temperature 98.4 F (36.9 C), temperature source Oral, resp. rate 17, height 5\' 4"  (1.626 m), weight 70.3 kg, SpO2 97 %.   Intake/Output Summary (Last 24 hours) at 06/02/2018 1632 Last data filed at 06/02/2018 0200 Gross per 24 hour  Intake -  Output 400 ml  Net -400 ml    Exam Gen:- Awake Alert, no acute distress , pt is not talkactive  HEENT:- .AT, No sclera icterus Neck-Supple Neck,No JVD,.  Lungs-  CTAB , good air movement bilaterally  CV- S1, S2 normal, regular Abd-  +ve B.Sounds, Abd Soft, No tenderness,    Extremity/Skin:- No  edema,   good pulses Psych-affect is depressed, oriented x3 Neuro-no new focal deficits, no tremors    Data Review   CBC w  Diff:  Lab Results  Component Value Date   WBC 7.8 06/02/2018   HGB 11.8 (L) 06/02/2018   HCT 37.5 06/02/2018   HCT 34.7 05/25/2018   PLT 120 (L) 06/02/2018   LYMPHOPCT 13 05/24/2018   MONOPCT 16 05/24/2018   EOSPCT 2  05/24/2018   BASOPCT 1 05/24/2018    CMP:  Lab Results  Component Value Date   NA 137 06/02/2018   K 3.9 06/02/2018   CL 101 06/02/2018   CO2 25 06/02/2018   BUN 27 (H) 06/02/2018   CREATININE 0.97 06/02/2018   PROT 6.1 (L) 05/24/2018   ALBUMIN 3.8 05/24/2018   BILITOT 1.0 05/24/2018   ALKPHOS 70 05/24/2018   AST 28 05/24/2018   ALT 42 05/24/2018  .  Total Discharge time is about 33 minutes  Roxan Hockey M.D on 06/02/2018 at 4:32 PM  Pager---[phone]  Go to [email or web address] - password [email or web address] for contact info  [facility] - Office  [phone]

## 2018-06-02 NOTE — Progress Notes (Signed)
Patient will DC to: [facility] Anticipated DC date: 06/02/18 Family notified: Sister Transport by: Sheriff (IVC)  If Sheriff unable to pick patient up tonight, they will transport her in the morning. [facility]) aware.  Per MD patient ready for DC to [facility]. RN, patient, patient's family, and facility notified of DC. IVC paperwork on the chart to be served.    Cedric Fishman, LCSW Clinical Social Worker [phone]

## 2018-06-02 NOTE — Progress Notes (Signed)
Pt given discharge instructions, prescriptions, and care notes. Pt verbalized understanding AEB no further questions or concerns at this time. IV was discontinued, no redness, pain, or swelling noted at this time. Telemetry discontinued and Centralized Telemetry was notified. Pt left the floor via wheelchair with sheriff department transport by wheelchair in stable condition.

## 2018-06-02 NOTE — Progress Notes (Signed)
  Pt is medically stable for Transfer  to inpatient psychiatric unit  Roxan Hockey, MD

## 2018-06-02 NOTE — Progress Notes (Addendum)
CSW faxed IVC paperwork to [facility]. CSW scheduled transportation with sheriff's department. [place] on site at hospital now. Sheriff's department is here to pick pt up now.   Report number [phone] Accepting Doctor is Dr. Randon Goldsmith  CSW updated RN.   Wendelyn Breslow, [facility]  [phone]

## 2018-06-02 NOTE — Progress Notes (Signed)
Occupational Therapy Treatment Patient Details Name: [name] MRN: [id number] DOB: May 22, 1946 Today's Date: 06/02/2018    History of present illness PRINCES FINGER is a 72 y.o. female with medical history significant of hypothyroidism; HTN; HLD; and DM presenting with AMS.   OT comments  Pt continues to requiring increased encouragement for participate in session. Pt performing shower and UB/LB dressing with Supervision-Mod I. Continue to recommend dc to Inpatient Psych and will continue to follow acutely as admitted.    Follow Up Recommendations  Other (comment);Supervision/Assistance - 24 hour(inpatient psych)    Equipment Recommendations  Other (comment)(Defer to next venue)    Recommendations for Other Services      Precautions / Restrictions Precautions Precautions: Fall;Other (comment) Precaution Comments: suicide Restrictions Weight Bearing Restrictions: No       Mobility Bed Mobility Overal bed mobility: Needs Assistance Bed Mobility: Supine to Sit     Supine to sit: Supervision     General bed mobility comments: sitting at EOB  Transfers Overall transfer level: Needs assistance Equipment used: None Transfers: Sit to/from Stand Sit to Stand: Supervision              Balance Overall balance assessment: Needs assistance Sitting-balance support: No upper extremity supported Sitting balance-Leahy Scale: Good     Standing balance support: No upper extremity supported;During functional activity Standing balance-Leahy Scale: Good Standing balance comment: static balance good               High Level Balance Comments: attempted to engage pt in side stepping and other balance activities, she declined despite encouragement           ADL either performed or assessed with clinical judgement   ADL Overall ADL's : Needs assistance/impaired     Grooming: Brushing hair;Supervision/safety;Sitting   Upper Body Bathing: Modified  independent;Standing Upper Body Bathing Details (indicate cue type and reason): Pt requiring increased time and encouragement for performing bathing in the shower. Providing supervision for safety and suicide precautions. Lower Body Bathing: Modified independent;Sit to/from stand Lower Body Bathing Details (indicate cue type and reason): Pt requiring increased time and encouragement for performing bathing in the shower. Providing supervision for safety and suicide precautions. Upper Body Dressing : Supervision/safety;Sitting Upper Body Dressing Details (indicate cue type and reason): donned new gown Lower Body Dressing: Supervision/safety;Sit to/from stand Lower Body Dressing Details (indicate cue type and reason): donning socks by bringing ankles to knees         Tub/ Shower Transfer: Walk-in shower;Supervision/safety;Ambulation   Functional mobility during ADLs: Supervision/safety General ADL Comments: Pt performing bathing at shower and getting dressed with supervision-Mod I.      Vision       Perception     Praxis      Cognition Arousal/Alertness: Awake/alert Behavior During Therapy: Anxious;Restless Overall Cognitive Status: Impaired/Different from baseline Area of Impairment: Orientation;Memory;Safety/judgement;Problem solving                 Orientation Level: Situation;Time;Disoriented to   Memory: Decreased short-term memory   Safety/Judgement: Decreased awareness of safety;Decreased awareness of deficits   Problem Solving: Slow processing;Decreased initiation;Requires verbal cues;Requires tactile cues General Comments: Paranoid and anxious        Exercises Exercises: General Lower Extremity   Shoulder Instructions       General Comments Pt asked to wash her hair, PT obtained shower cap however pt refused to use it once PT brought it back to room.     Pertinent Vitals/ Pain  Pain Assessment: No/denies pain  Home Living                                           Prior Functioning/Environment              Frequency  Min 2X/week        Progress Toward Goals  OT Goals(current goals can now be found in the care plan section)  Progress towards OT goals: Progressing toward goals  Acute Rehab OT Goals Patient Stated Goal: none stated Time For Goal Achievement: 06/09/18 Potential to Achieve Goals: Good ADL Goals Pt Will Perform Grooming: with modified independence;standing Pt Will Perform Lower Body Bathing: with modified independence;sit to/from stand Pt Will Perform Lower Body Dressing: with modified independence;sit to/from stand Pt Will Transfer to Toilet: with supervision;ambulating Pt Will Perform Toileting - Clothing Manipulation and hygiene: with supervision;sit to/from stand  Plan Discharge plan remains appropriate    Co-evaluation                 AM-PAC PT "6 Clicks" Daily Activity     Outcome Measure   Help from another person eating meals?: None Help from another person taking care of personal grooming?: None Help from another person toileting, which includes using toliet, bedpan, or urinal?: A Little Help from another person bathing (including washing, rinsing, drying)?: A Little Help from another person to put on and taking off regular upper body clothing?: None Help from another person to put on and taking off regular lower body clothing?: A Little 6 Click Score: 21    End of Session    OT Visit Diagnosis: Unsteadiness on feet (R26.81);Muscle weakness (generalized) (M62.81);Pain;Other symptoms and signs involving cognitive function   Activity Tolerance Patient tolerated treatment well   Patient Left in bed;with call bell/phone within reach;with nursing/sitter in room   Nurse Communication Mobility status        Time: 6144-3154 OT Time Calculation (min): 33 min  Charges: OT General Charges $OT Visit: 1 Visit OT Treatments $Self Care/Home Management : 23-37  mins  [place], OTR/L Acute Rehab Pager: [phone] Office: [place] 06/02/2018, 5:01 PM

## 2018-06-02 NOTE — Progress Notes (Signed)
[facility] is able to accept patient tonight if patient is IVC'd. CSW updated patient's sister and paged MD.   Cedric Fishman LCSW [phone]

## 2018-06-02 NOTE — Progress Notes (Signed)
Pt prepared for d/c to [facility]. IV d/c'd. Skin intact except as charted in most recent assessments. Vitals are stable. Report called to receiving facility. Pt to be transported by [employer] to facility.

## 2018-06-02 NOTE — Progress Notes (Signed)
IVC sent to magistrate. Once patient has been served, please contact [facility] CSW at [phone]. She will contact the [facility] for transport [phone]) and fax the IVC to [facility] as requested at [phone].  Percell Locus Tesean Stump LCSW [phone]

## 2018-06-02 NOTE — Progress Notes (Signed)
CSW faxed requested updates to [facility]. They reported that they received the fax, but do not have the original referral. CSW faxed that again as well.   Julia Locus Becka Lagasse LCSW [phone]

## 2018-06-07 ENCOUNTER — Ambulatory Visit: Payer: Medicare HMO | Admitting: Psychiatry

## 2018-06-17 ENCOUNTER — Ambulatory Visit: Payer: Medicare HMO | Admitting: Psychiatry

## 2018-06-17 ENCOUNTER — Telehealth: Payer: Self-pay | Admitting: Psychiatry

## 2018-06-17 NOTE — Telephone Encounter (Signed)
This note is on [name] a 72 year old white female.  Last seen by me 05/17/2018 with recent worsening of depression and anxiety.  No suicidal ideation or plan.  At her previous visit on 04/22/2018 he had suicidal thoughts and suicidal plans (first time ever). At her 45 /4 visit we did not change medications as she was not sure what she wanted to change to.  She was hospitalized from 11/20 to 06/16/2018 at [facility] in [place].  It was involuntary commitment.  Major depressive disorder with psychosis. Patient was found unresponsive.  She admitted taking overdose on pills intentionally.  Continued to have anxiety and depression.  Been having paranoid thoughts for 4 months.  Like people were after her, friend setting up things for her, people stealing from her, people putting a camera into her home to monitor her. she has become increasingly paranoid anticipating her move.  She was also having auditory hallucinations hearing voices for 4 months. Patient has been paranoid for a while but increasing over the last 4 months. As mentioned she was hospitalized from 06/02/2018 to 06/16/2018.  She was discharged on Risperdal 2 mg at bedtime Lexapro 20 mg a day, and Klonopin 1 mg at bedtime.  There is some confusion with the meds as in her discharge summary had her on Paxil. At the time of discharge she remained quiet and minimally socially interactive but she had denied hallucinations and denied suicidal thoughts.   She missed her appointment today.  I will have the nurse call her to check on her.   I will have the nurse call to check on her.

## 2018-07-21 ENCOUNTER — Other Ambulatory Visit: Payer: Self-pay | Admitting: Family Medicine

## 2018-07-22 ENCOUNTER — Other Ambulatory Visit (HOSPITAL_COMMUNITY): Payer: Self-pay | Admitting: Family Medicine

## 2018-07-22 DIAGNOSIS — R609 Edema, unspecified: Secondary | ICD-10-CM

## 2018-07-23 ENCOUNTER — Ambulatory Visit (HOSPITAL_COMMUNITY)
Admission: RE | Admit: 2018-07-23 | Discharge: 2018-07-23 | Disposition: A | Discharge status: Home / Self Care | Payer: Medicare Other | Source: Ambulatory Visit | Attending: Cardiology | Admitting: Cardiology

## 2018-07-23 DIAGNOSIS — R609 Edema, unspecified: Secondary | ICD-10-CM | POA: Diagnosis present

## 2018-07-23 NOTE — Progress Notes (Signed)
Bilateral lower extremities venous duplex exam completed.  Details please see preliminary notes on CV PROC under chart review. Result called ordering physician's office spoke with Dr. Lucia Estelle RN Volney Presser, she will contact patient as soon as possible for the further instruction. Cozetta Seif H Daleyza Gadomski(RDMS RVT) 07/23/18 12:10 PM

## 2018-07-29 ENCOUNTER — Ambulatory Visit: Payer: Medicare Other | Admitting: Psychiatry

## 2018-07-29 DIAGNOSIS — F333 Major depressive disorder, recurrent, severe with psychotic symptoms: Secondary | ICD-10-CM | POA: Diagnosis not present

## 2018-07-29 MED ORDER — LITHIUM CARBONATE 150 MG PO CAPS: 150.0000 mg | ORAL_CAPSULE | Freq: Every day | ORAL | 0 refills | Status: DC

## 2018-07-29 MED ORDER — RISPERIDONE 1 MG PO TABS: ORAL_TABLET | ORAL | 0 refills | Status: DC

## 2018-07-29 MED ORDER — ESCITALOPRAM OXALATE 20 MG PO TABS: ORAL_TABLET | ORAL | 0 refills | Status: DC

## 2018-07-29 MED ORDER — PAROXETINE HCL 40 MG PO TABS: ORAL_TABLET | ORAL | 0 refills | Status: DC

## 2018-07-29 NOTE — Progress Notes (Signed)
[facility] Check  Patient ID: [name],  MRN: [id number]  PCP: Leighton Ruff, MD  Date of Evaluation: 07/29/2018 Time spent:20 minutes  Chief Complaint:   HISTORY/CURRENT STATUS: HPI patient last seen 05/17/2018.  Patient hospitalized 11/20 through 12/04  for involuntary commitment.  Patient had a major depressive disorder with psychosis including paranoid and auditory hallucinations.  Pt OD on pills   At discharge she was put on Risperdal Lexapro and Klonopin.  Patient went back to taking Paxil and Xanax because that is what I had her on prior. She continues not to do well she feels hopeless she has problems existing.  Decreased sleep, suicidal thoughts daily and is thought about pills but no suicidal gesture. She still paranoid feel people are messing with her food she is being followed throughout together.  She thinks that someone think she did something intentionally. Before her 06/02/2018 admission patient states she was at [facility] for 2 weeks.  I do not have the records.  Individual Medical History/ Review of Systems: Changes? :recent hospitalization  Allergies: Contrast media [iodinated diagnostic agents]; Abilify [aripiprazole]; Ultram [tramadol hcl]; and Zithromax [azithromycin]  Current Medications:  Current Outpatient Medications:  .  ACCU-CHEK FASTCLIX LANCETS MISC, CHECK BLOOD SUGAR ONCE A DAY, Disp: , Rfl: 3 .  ACCU-CHEK GUIDE test strip, CHECK BLOOD SUGAR ONCE A DAY, Disp: , Rfl: 3 .  acetaminophen (TYLENOL) 325 MG tablet, Take 2 tablets (650 mg total) by mouth every 6 (six) hours as needed for moderate pain (headache)., Disp: 20 tablet, Rfl: 1 .  albuterol (PROVENTIL HFA;VENTOLIN HFA) 108 (90 Base) MCG/ACT inhaler, Inhale 1-2 puffs into the lungs every 6 (six) hours as needed for wheezing or shortness of breath., Disp: , Rfl:  .  aspirin EC 81 MG tablet, Take 81 mg by mouth daily., Disp: , Rfl:  .  atorvastatin (LIPITOR) 40 MG tablet, Take 40 mg  by mouth daily., Disp: , Rfl:  .  cetirizine (ZYRTEC) 10 MG tablet, Take 10 mg by mouth daily as needed for allergies., Disp: , Rfl:  .  Cholecalciferol (VITAMIN D) 2000 UNITS tablet, Take 2,000 Units by mouth daily., Disp: , Rfl:  .  clonazePAM (KLONOPIN) 1 MG tablet, Take 1 tablet (1 mg total) by mouth at bedtime., Disp: 30 tablet, Rfl: 0 .  cycloSPORINE (RESTASIS) 0.05 % ophthalmic emulsion, Place 1 drop into both eyes 2 (two) times daily., Disp: , Rfl:  .  Ergocalciferol (VITAMIN D2 PO), Take 2,000 Units by mouth daily. , Disp: , Rfl:  .  escitalopram (LEXAPRO) 10 MG tablet, Take 1 tablet (10 mg total) by mouth daily., Disp: 30 tablet, Rfl: 0 .  feeding supplement, ENSURE ENLIVE, (ENSURE ENLIVE) LIQD, Take 237 mLs by mouth daily., Disp: 237 mL, Rfl: 12 .  ferrous sulfate 325 (65 FE) MG EC tablet, Take 325 mg by mouth 3 (three) times daily with meals., Disp: , Rfl:  .  fluticasone (FLONASE) 50 MCG/ACT nasal spray, Place 1-2 sprays into both nostrils daily., Disp: , Rfl:  .  folic acid (FOLVITE) 1 MG tablet, Take 1 mg by mouth daily. , Disp: , Rfl:  .  HUMIRA PEDIATRIC CROHNS START 40 MG/0.8ML PSKT, Inject 1 each into the skin every 14 (fourteen) days. , Disp: , Rfl:  .  hydroxychloroquine (PLAQUENIL) 200 MG tablet, Take 400 mg by mouth daily., Disp: , Rfl:  .  leflunomide (ARAVA) 20 MG tablet, Take 20 mg by mouth daily., Disp: , Rfl:  .  lisinopril (PRINIVIL,ZESTRIL)  10 MG tablet, Take 10 mg by mouth daily., Disp: , Rfl: 1 .  methotrexate (RHEUMATREX) 2.5 MG tablet, 15 mg once a week. Wednesday, Disp: , Rfl:  .  metoprolol succinate (TOPROL-XL) 25 MG 24 hr tablet, Take 25 mg by mouth daily., Disp: , Rfl:  .  Multiple Vitamin (MULTIVITAMIN WITH MINERALS) TABS tablet, Take 1 tablet by mouth daily., Disp: 30 tablet, Rfl: 3 .  OLANZapine zydis (ZYPREXA) 5 MG disintegrating tablet, Take 0.5 tablets (2.5 mg total) by mouth at bedtime., Disp: 30 tablet, Rfl: 2 .  predniSONE (DELTASONE) 5 MG tablet,  Take 5 mg by mouth daily with breakfast., Disp: , Rfl:  .  vitamin B-12 (CYANOCOBALAMIN) 1000 MCG tablet, Take 1,000 mcg by mouth daily., Disp: , Rfl:  Medication Side Effects: none  Family Medical/ Social History: Changes? No  MENTAL HEALTH EXAM:  There were no vitals taken for this visit.There is no height or weight on file to calculate BMI.  General Appearance: Casual  Eye Contact:  Fair  Speech:  Clear and Coherent  Volume:  Normal  Mood:  Depressed and Hopeless  Affect:  Appropriate  Thought Process:  Linear  Orientation:  Full (Time, Place, and Person)  Thought Content: Paranoid Ideation   Suicidal Thoughts:  Yes.  with intent/plan  Homicidal Thoughts:  No  Memory:  WNL  Judgement:  Fair  Insight:  Fair  Psychomotor Activity:  Normal  Concentration:  Concentration: Fair  Recall:  Good  Fund of Knowledge: Good  Language: Good  Assets:  Desire for Improvement  ADL's:  Intact  Cognition: WNL  Prognosis:  Fair    DIAGNOSES: No diagnosis found.  Receiving Psychotherapy: No    RECOMMENDATIONS: Patient was discussed with Dr. Charlott Holler.  She is to wean off Paxil 40 mg she is to take 1/2 pill a day for a week and then stop.  She will start on Lexapro 20 mg one half a day for a week and then 1 whole/day.   she is to start lithium 150 mg a day.  she is to start Risperdal 1 mg a day for 3 days and then 2 mg a day. Patient does have Xanax at home.   At this time she is not able to commit to safety.    Comer Locket, PA-C

## 2018-08-02 ENCOUNTER — Telehealth: Payer: Self-pay | Admitting: Psychiatry

## 2018-08-02 NOTE — Telephone Encounter (Signed)
Pt seen 1/16 . She only took Risperal and Lexapro Sunday am to start, and 1/2 tab of Paxil. About 15-20 mins later started throwing up. She felt awful, so she did not take any today and never took the lithium Sun night to start. Wants to know what to do- has appt for follow up 1/23? Should she keep?

## 2018-08-03 NOTE — Telephone Encounter (Signed)
  Spoke with patient over the phone last night. I saw last Thursday. Sunday pt. Started lexapro, continued Paxil and started Risperdal. Nausea and vomiting. Didn't take meds yesterday. Yesterday pt felt depression slightly better. No S1 at the time. Still can't commit to safety. No hallucinations. Paranoia better. Some delusional thinking. Has recheck 08/05/18.  Go ahead and continue Paxil at 20mg /day and start lexapro 10mg /day. Hold on risperdal and lithium. Consider Vraylar, latuda, seroquel. Zyprexa. Also consider remeron, trintellix, mirapex, and Depakote.

## 2018-08-05 ENCOUNTER — Ambulatory Visit: Payer: Medicare Other | Admitting: Psychiatry

## 2018-08-09 ENCOUNTER — Emergency Department (HOSPITAL_COMMUNITY): Payer: Medicare Other

## 2018-08-09 ENCOUNTER — Telehealth: Payer: Self-pay | Admitting: Psychiatry

## 2018-08-09 ENCOUNTER — Emergency Department (EMERGENCY_DEPARTMENT_HOSPITAL)
Admission: EM | Admit: 2018-08-09 | Discharge: 2018-08-10 | Disposition: A | Discharge status: Home / Self Care | Payer: Medicare Other | Source: Home / Self Care | Attending: Emergency Medicine | Admitting: Emergency Medicine

## 2018-08-09 ENCOUNTER — Ambulatory Visit: Payer: Medicare Other | Admitting: Psychiatry

## 2018-08-09 ENCOUNTER — Other Ambulatory Visit: Payer: Self-pay

## 2018-08-09 DIAGNOSIS — E119 Type 2 diabetes mellitus without complications: Secondary | ICD-10-CM

## 2018-08-09 DIAGNOSIS — Z008 Encounter for other general examination: Secondary | ICD-10-CM | POA: Insufficient documentation

## 2018-08-09 DIAGNOSIS — I10 Essential (primary) hypertension: Secondary | ICD-10-CM

## 2018-08-09 DIAGNOSIS — Z79899 Other long term (current) drug therapy: Secondary | ICD-10-CM

## 2018-08-09 DIAGNOSIS — F333 Major depressive disorder, recurrent, severe with psychotic symptoms: Secondary | ICD-10-CM

## 2018-08-09 DIAGNOSIS — F332 Major depressive disorder, recurrent severe without psychotic features: Secondary | ICD-10-CM | POA: Diagnosis not present

## 2018-08-09 DIAGNOSIS — Z7982 Long term (current) use of aspirin: Secondary | ICD-10-CM | POA: Insufficient documentation

## 2018-08-09 DIAGNOSIS — R45851 Suicidal ideations: Secondary | ICD-10-CM

## 2018-08-09 DIAGNOSIS — E039 Hypothyroidism, unspecified: Secondary | ICD-10-CM | POA: Insufficient documentation

## 2018-08-09 DIAGNOSIS — Z049 Encounter for examination and observation for unspecified reason: Secondary | ICD-10-CM

## 2018-08-09 LAB — COMPREHENSIVE METABOLIC PANEL
ALT: 47 U/L — ABNORMAL HIGH (ref 0–44)
ANION GAP: 13 (ref 5–15)
AST: 47 U/L — ABNORMAL HIGH (ref 15–41)
Albumin: 4.4 g/dL (ref 3.5–5.0)
Alkaline Phosphatase: 61 U/L (ref 38–126)
BILIRUBIN TOTAL: 0.6 mg/dL (ref 0.3–1.2)
BUN: 29 mg/dL — ABNORMAL HIGH (ref 8–23)
CO2: 23 mmol/L (ref 22–32)
Calcium: 9 mg/dL (ref 8.9–10.3)
Chloride: 102 mmol/L (ref 98–111)
Creatinine, Ser: 0.92 mg/dL (ref 0.44–1.00)
GFR calc Af Amer: >60 mL/min (ref >60)
GFR calc non Af Amer: >60 mL/min (ref >60)
Glucose, Bld: 111 mg/dL — ABNORMAL HIGH (ref 70–99)
POTASSIUM: 3.2 mmol/L — AB (ref 3.5–5.1)
SODIUM: 138 mmol/L (ref 135–145)
TOTAL PROTEIN: 6.8 g/dL (ref 6.5–8.1)

## 2018-08-09 LAB — RAPID URINE DRUG SCREEN, HOSP PERFORMED
Amphetamines: NOT DETECTED
BARBITURATES: NOT DETECTED
BENZODIAZEPINES: POSITIVE — AB
COCAINE: NOT DETECTED
OPIATES: NOT DETECTED
Tetrahydrocannabinol: NOT DETECTED

## 2018-08-09 LAB — URINALYSIS, ROUTINE W REFLEX MICROSCOPIC
Bilirubin Urine: NEGATIVE
GLUCOSE, UA: NEGATIVE mg/dL
Ketones, ur: 20 mg/dL — AB
Nitrite: NEGATIVE
PH: 5 (ref 5.0–8.0)
Protein, ur: 100 mg/dL — AB
RBC / HPF: >50 RBC/hpf — ABNORMAL HIGH (ref 0–5)
Specific Gravity, Urine: 1.025 (ref 1.005–1.030)

## 2018-08-09 LAB — CBC WITH DIFFERENTIAL/PLATELET
Abs Immature Granulocytes: 0.02 10*3/uL (ref 0.00–0.07)
BASOS ABS: 0.1 10*3/uL (ref 0.0–0.1)
Basophils Relative: 1 %
EOS ABS: 0.1 10*3/uL (ref 0.0–0.5)
EOS PCT: 2 %
HEMATOCRIT: 39.8 % (ref 36.0–46.0)
Hemoglobin: 12.7 g/dL (ref 12.0–15.0)
Immature Granulocytes: 0 %
Lymphocytes Relative: 28 %
Lymphs Abs: 2.1 10*3/uL (ref 0.7–4.0)
MCH: 32.8 pg (ref 26.0–34.0)
MCHC: 31.9 g/dL (ref 30.0–36.0)
MCV: 102.8 fL — ABNORMAL HIGH (ref 80.0–100.0)
Monocytes Absolute: 0.7 10*3/uL (ref 0.1–1.0)
Monocytes Relative: 9 %
NRBC: 0 % (ref 0.0–0.2)
Neutro Abs: 4.5 10*3/uL (ref 1.7–7.7)
Neutrophils Relative %: 60 %
Platelets: 154 10*3/uL (ref 150–400)
RBC: 3.87 MIL/uL (ref 3.87–5.11)
RDW: 14 % (ref 11.5–15.5)
WBC: 7.5 10*3/uL (ref 4.0–10.5)

## 2018-08-09 LAB — ACETAMINOPHEN LEVEL: Acetaminophen (Tylenol), Serum: <10 ug/mL — ABNORMAL LOW (ref 10–30)

## 2018-08-09 LAB — SALICYLATE LEVEL: Salicylate Lvl: <7 mg/dL (ref 2.8–30.0)

## 2018-08-09 LAB — ETHANOL

## 2018-08-09 MED ORDER — ATORVASTATIN CALCIUM 40 MG PO TABS
40.0000 mg | ORAL_TABLET | Freq: Every day | ORAL | Status: DC
  Administered 2018-08-10: 40 mg via ORAL
  Filled 2018-08-09: qty 1

## 2018-08-09 MED ORDER — POTASSIUM CHLORIDE CRYS ER 20 MEQ PO TBCR
40.0000 meq | EXTENDED_RELEASE_TABLET | Freq: Once | ORAL | Status: DC
  Filled 2018-08-09: qty 2

## 2018-08-09 MED ORDER — HYDROXYCHLOROQUINE SULFATE 200 MG PO TABS
400.0000 mg | ORAL_TABLET | Freq: Every day | ORAL | Status: DC
  Administered 2018-08-10: 400 mg via ORAL
  Filled 2018-08-09: qty 2

## 2018-08-09 MED ORDER — LORATADINE 10 MG PO TABS
10.0000 mg | ORAL_TABLET | Freq: Every day | ORAL | Status: DC
  Administered 2018-08-10: 10 mg via ORAL
  Filled 2018-08-09: qty 1

## 2018-08-09 MED ORDER — HYDROCHLOROTHIAZIDE 12.5 MG PO CAPS
12.5000 mg | ORAL_CAPSULE | Freq: Every morning | ORAL | Status: DC
  Administered 2018-08-10: 12.5 mg via ORAL
  Filled 2018-08-09: qty 1

## 2018-08-09 MED ORDER — PREDNISONE 5 MG PO TABS
5.0000 mg | ORAL_TABLET | Freq: Every day | ORAL | Status: DC
  Administered 2018-08-10: 5 mg via ORAL
  Filled 2018-08-09: qty 1

## 2018-08-09 MED ORDER — ESCITALOPRAM OXALATE 10 MG PO TABS
20.0000 mg | ORAL_TABLET | Freq: Every day | ORAL | Status: DC
  Administered 2018-08-09 – 2018-08-10 (×2): 20 mg via ORAL
  Filled 2018-08-09 (×2): qty 2

## 2018-08-09 MED ORDER — ACETAMINOPHEN 325 MG PO TABS: 650.0000 mg | ORAL_TABLET | Freq: Four times a day (QID) | ORAL | Status: DC | PRN

## 2018-08-09 MED ORDER — LISINOPRIL 10 MG PO TABS
10.0000 mg | ORAL_TABLET | Freq: Every day | ORAL | Status: DC
  Administered 2018-08-10: 10 mg via ORAL
  Filled 2018-08-09: qty 1

## 2018-08-09 MED ORDER — LEFLUNOMIDE 20 MG PO TABS
20.0000 mg | ORAL_TABLET | Freq: Every day | ORAL | Status: DC
  Administered 2018-08-10: 20 mg via ORAL
  Filled 2018-08-09: qty 1

## 2018-08-09 MED ORDER — METOPROLOL SUCCINATE ER 25 MG PO TB24
25.0000 mg | ORAL_TABLET | Freq: Every day | ORAL | Status: DC
  Administered 2018-08-10: 25 mg via ORAL
  Filled 2018-08-09: qty 1

## 2018-08-09 MED ORDER — ASPIRIN EC 81 MG PO TBEC
81.0000 mg | DELAYED_RELEASE_TABLET | Freq: Every day | ORAL | Status: DC
  Administered 2018-08-10: 81 mg via ORAL
  Filled 2018-08-09: qty 1

## 2018-08-09 NOTE — ED Triage Notes (Signed)
Pt called her PA at [facility], told him, " I feel like taking a hand full of pills. I would not have did. I told him, I would be alright, but he insisted to come to the emergency room. Presently, she denies SI/AVH/pain.

## 2018-08-09 NOTE — BH Assessment (Signed)
[facility] Assessment Progress Note Case was staffed with Reita Cliche DNP who recommended a inpatient admission to stabilize.

## 2018-08-09 NOTE — Telephone Encounter (Signed)
Cancelled appt. Today. This provider called pt at home. Bad anxiety and worsening depression to point of wanting to commit suicide.Hopeless.  Unable to contract for safely. Discussed with Dr. Clovis Pu. Called pt back and told her she needs to go to hosp or we would get IVC Finally agreed to go to hosp. Later Nurse called hospital  And pt was in ED.  She was IVC 11/20-12/04. For depression with suicidal attempt and psychosis.Current Sp is pills. Never started Lithium and only tried Risperdal 1 night.  Called Telford Nab, sister, to check on pt. [phone]. Pt was on way to hospital when sister called her. Pt lives [address] [place]  [id number]

## 2018-08-09 NOTE — ED Notes (Signed)
Pt A&O x 3, no distress noted, calm & cooperative, depressed.  Comfort measures given.  Monitoring for safety, Q 15 min checks in effect.

## 2018-08-09 NOTE — ED Provider Notes (Signed)
[facility] DEPT Provider Note   CSN: [id number] Arrival date & time: 08/09/18  1440     History   Chief Complaint Chief Complaint  Patient presents with  . Psychiatric Evaluation    HPI [name] is a 73 y.o. female.  HPI Patient states that she was feeling lonely and depressed this morning.  Began having suicidal ideations.  States she was thinking about taking some pills.  Denies any ingestion.  States she is feeling better currently.  Denies current suicidal ideation.  Denies homicidal ideation, auditory or visual hallucinations. Past Medical History:  Diagnosis Date  . Anxiety   . Arthritis    rhematoid,osteoarthritis  . Diabetes mellitus without complication ([place])   . Elevated cholesterol   . Hemorrhoids   . Hypertension   . Hypothyroidism   . Joint pain   . Nocturia   . Sleeping difficulties     Patient Active Problem List   Diagnosis Date Noted  . MDD (major depressive disorder), recurrent, severe, with psychosis ([place])   . Altered mental status 05/24/2018  . Ketonuria 05/24/2018  . Pain in right hand 09/11/2017  . Hypertension 03/07/2014  . Hyperlipidemia 03/07/2014  . Depression 03/07/2014  . 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 Type 2 diabetes mellitus without complication ([place]) 40/98/1191  . Rheumatoid arthritis ([place]) 02/24/2014  . Scoliosis of lumbar spine 02/13/2014    Past Surgical History:  Procedure Laterality Date  . ABDOMINAL HYSTERECTOMY    . BACK SURGERY    . CERVICAL FUSION  2004  . CHOLECYSTECTOMY    . EYE SURGERY Left    cataract  . PARS PLANA REPAIR OF RETINAL DEATACHMENT    . THYROIDECTOMY, PARTIAL       OB History   No obstetric history on file.      Home Medications    Prior to Admission medications   Medication Sig Start Date End Date Taking? Authorizing Provider  acetaminophen (TYLENOL) 325 MG tablet Take 2 tablets (650 mg total) by mouth  every 6 (six) hours as needed for moderate pain (headache). 06/02/18  Yes Emokpae, Courage, MD  albuterol (PROVENTIL HFA;VENTOLIN HFA) 108 (90 Base) MCG/ACT inhaler Inhale 1-2 puffs into the lungs every 6 (six) hours as needed for wheezing or shortness of breath.   Yes [provider]  aspirin EC 81 MG tablet Take 81 mg by mouth daily.   Yes [provider]  atorvastatin (LIPITOR) 40 MG tablet Take 40 mg by mouth daily.   Yes [provider]  cetirizine (ZYRTEC) 10 MG tablet Take 10 mg by mouth daily as needed for allergies.   Yes [provider]  Cholecalciferol (VITAMIN D) 2000 UNITS tablet Take 2,000 Units by mouth daily.   Yes [provider]  cycloSPORINE (RESTASIS) 0.05 % ophthalmic emulsion Place 1 drop into both eyes 2 (two) times daily.   Yes [provider]  escitalopram (LEXAPRO) 20 MG tablet 1/2 tab a day for 1 week, then 1 per day Patient taking differently: Take 20 mg by mouth daily. 1/2 tab a day for 1 week, then 1 per day 07/29/18  Yes Shugart, [name], PA-C  fluticasone (FLONASE) 50 MCG/ACT nasal spray Place 1-2 sprays into both nostrils daily as needed for allergies.    Yes [provider]  folic acid (FOLVITE) 1 MG tablet Take 1 mg by mouth daily.    Yes [provider]  hydrochlorothiazide (HYDRODIURIL) 12.5 MG tablet Take 12.5 mg  by mouth every morning. 07/23/18  Yes [provider]  hydroxychloroquine (PLAQUENIL) 200 MG tablet Take 400 mg by mouth daily.   Yes [provider]  leflunomide (ARAVA) 20 MG tablet Take 20 mg by mouth daily.   Yes [provider]  lisinopril (PRINIVIL,ZESTRIL) 10 MG tablet Take 10 mg by mouth daily. 10/20/17  Yes [provider]  methotrexate (RHEUMATREX) 2.5 MG tablet 15 mg once a week. Wednesday 04/19/16  Yes [provider]  metoprolol succinate (TOPROL-XL) 25 MG 24 hr tablet Take 25 mg by mouth daily.   Yes [provider]   predniSONE (DELTASONE) 5 MG tablet Take 5 mg by mouth daily with breakfast.   Yes [provider]  ACCU-CHEK GUIDE test strip CHECK BLOOD SUGAR ONCE A DAY 10/29/17   [provider]  feeding supplement, ENSURE ENLIVE, (ENSURE ENLIVE) LIQD Take 237 mLs by mouth daily. Patient not taking: Reported on 08/09/2018 06/03/18   Roxan Hockey, MD  HUMIRA PEDIATRIC CROHNS START 40 MG/0.8ML PSKT Inject 1 each into the skin every 14 (fourteen) days.  04/09/16   [provider]  lithium carbonate 150 MG capsule Take 1 capsule (150 mg total) by mouth daily. Patient not taking: Reported on 08/09/2018 07/29/18   Comer Locket, PA-C  Multiple Vitamin (MULTIVITAMIN WITH MINERALS) TABS tablet Take 1 tablet by mouth daily. Patient not taking: Reported on 08/09/2018 06/03/18   Roxan Hockey, MD  OLANZapine zydis (ZYPREXA) 5 MG disintegrating tablet Take 0.5 tablets (2.5 mg total) by mouth at bedtime. Patient not taking: Reported on 08/09/2018 06/02/18   Roxan Hockey, MD  PARoxetine (PAXIL) 40 MG tablet 1/2 tab for a week, then stop Patient not taking: Reported on 08/09/2018 07/29/18   Comer Locket, PA-C  risperiDONE (RISPERDAL) 1 MG tablet 1 pill a day for 3 days, then 2 a day Patient not taking: Reported on 08/09/2018 07/29/18   Comer Locket, PA-C    Family History Family History  Problem Relation Age of Onset  . Diabetes Mother   . Breast cancer Mother   . Gout Mother   . Alzheimer's disease Father   . Heart Problems Father   . Breast cancer Sister   . Alzheimer's disease Sister   . Diabetes Sister   . Gout Sister   . Heart Problems Sister   . Alzheimer's disease Brother   . Diabetes Brother   . Heart Problems Brother     Social History Social History   Tobacco Use  . Smoking status: Never Smoker  . Smokeless tobacco: Never Used  Substance Use Topics  . Alcohol use: No  . Drug use: No     Allergies   Contrast media [iodinated diagnostic agents]; Abilify  [aripiprazole]; Ultram [tramadol hcl]; and Zithromax [azithromycin]   Review of Systems Review of Systems  Constitutional: Negative for chills and fever.  Respiratory: Negative for shortness of breath.   Cardiovascular: Negative for chest pain.  Gastrointestinal: Negative for abdominal pain, nausea and vomiting.  Genitourinary: Negative for dysuria and frequency.  Musculoskeletal: Negative for back pain.  Skin: Negative for rash and wound.  Neurological: Negative for dizziness, weakness, light-headedness, numbness and headaches.  All other systems reviewed and are negative.    Physical Exam Updated Vital Signs BP (!) 153/84 (BP Location: Right Arm)   Pulse (!) 105   Temp 98.7 F (37.1 C) (Oral)   Resp 16   SpO2 100%   Physical Exam Vitals signs and nursing note reviewed.  Constitutional:  Appearance: Normal appearance. She is well-developed.  HENT:     Head: Normocephalic and atraumatic.     Nose: Nose normal.     Mouth/Throat:     Mouth: Mucous membranes are moist.  Eyes:     Pupils: Pupils are equal, round, and reactive to light.  Neck:     Musculoskeletal: Normal range of motion and neck supple.  Cardiovascular:     Rate and Rhythm: Normal rate and regular rhythm.  Pulmonary:     Effort: Pulmonary effort is normal.     Breath sounds: Normal breath sounds.  Abdominal:     General: Bowel sounds are normal.     Palpations: Abdomen is soft.     Tenderness: There is no abdominal tenderness. There is no guarding or rebound.  Musculoskeletal: Normal range of motion.        General: No tenderness.  Skin:    General: Skin is warm and dry.     Capillary Refill: Capillary refill takes less than 2 seconds.     Findings: No erythema or rash.  Neurological:     General: No focal deficit present.     Mental Status: She is alert and oriented to person, place, and time.  Psychiatric:        Behavior: Behavior normal.     Comments: Mildly dysphoric mood.  Does not  appear to be responding to internal stimuli.  Denies SI.      ED Treatments / Results  Labs (all labs ordered are listed, but only abnormal results are displayed) Labs Reviewed  CBC WITH DIFFERENTIAL/PLATELET - Abnormal; Notable for the following components:      Result Value   MCV 102.8 (*)    All other components within normal limits  COMPREHENSIVE METABOLIC PANEL - Abnormal; Notable for the following components:   Potassium 3.2 (*)    Glucose, Bld 111 (*)    BUN 29 (*)    AST 47 (*)    ALT 47 (*)    All other components within normal limits  RAPID URINE DRUG SCREEN, HOSP PERFORMED - Abnormal; Notable for the following components:   Benzodiazepines POSITIVE (*)    All other components within normal limits  URINALYSIS, ROUTINE W REFLEX MICROSCOPIC - Abnormal; Notable for the following components:   Color, Urine AMBER (*)    APPearance CLOUDY (*)    Hgb urine dipstick LARGE (*)    Ketones, ur 20 (*)    Protein, ur 100 (*)    Leukocytes, UA TRACE (*)    RBC / HPF >50 (*)    Bacteria, UA RARE (*)    All other components within normal limits  ACETAMINOPHEN LEVEL - Abnormal; Notable for the following components:   Acetaminophen (Tylenol), Serum <10 (*)    All other components within normal limits  ETHANOL  SALICYLATE LEVEL    EKG None  Radiology Dg Chest 2 View  Result Date: 08/09/2018 CLINICAL DATA:  73 year old female with hypertension EXAM: CHEST - 2 VIEW COMPARISON:  06/01/2018, 05/24/2018 FINDINGS: Cardiomediastinal silhouette unchanged in size and contour. No evidence of central vascular congestion. No interlobular septal thickening. No pneumothorax or pleural effusion. No confluent airspace disease. Surgical changes of the cervical region incompletely imaged. Surgical changes of the lumbar spine incompletely imaged. No displaced fracture IMPRESSION: Negative for acute cardiopulmonary disease Electronically Signed   By: Corrie Mckusick D.O.   On: 08/09/2018 16:49     Procedures Procedures (including critical care time)  Medications Ordered in ED  Medications  escitalopram (LEXAPRO) tablet 20 mg (20 mg Oral Given 08/09/18 1734)  potassium chloride SA (K-DUR,KLOR-CON) CR tablet 40 mEq (40 mEq Oral Refused 08/09/18 2230)  acetaminophen (TYLENOL) tablet 650 mg (has no administration in time range)  aspirin EC tablet 81 mg (has no administration in time range)  atorvastatin (LIPITOR) tablet 40 mg (has no administration in time range)  loratadine (CLARITIN) tablet 10 mg (has no administration in time range)  hydrochlorothiazide (HYDRODIURIL) tablet 12.5 mg (has no administration in time range)  hydroxychloroquine (PLAQUENIL) tablet 400 mg (has no administration in time range)  leflunomide (ARAVA) tablet 20 mg (has no administration in time range)  lisinopril (PRINIVIL,ZESTRIL) tablet 10 mg (has no administration in time range)  metoprolol succinate (TOPROL-XL) 24 hr tablet 25 mg (has no administration in time range)  predniSONE (DELTASONE) tablet 5 mg (has no administration in time range)     Initial Impression / Assessment and Plan / ED Course  I have reviewed the triage vital signs and the nursing notes.  Pertinent labs & imaging results that were available during my care of the patient were reviewed by me and considered in my medical decision making (see chart for details).    Given oral potassium replacement.  Medically cleared for psych evaluation. Will have TTS evaluate.  Final Clinical Impressions(s) / ED Diagnoses   Final diagnoses:  Suicidal ideation    ED Discharge Orders    None       Julianne Rice, MD 08/09/18 2241

## 2018-08-09 NOTE — BH Assessment (Addendum)
Assessment Note  [name] is an 73 y.o. female who presents voluntarily to be evaluated after her therapist at [facility] told her to present to [facility] for a evaluation after stating "she was going to take a handful of pills to end her life" earlier this date. Patient denies H/I or AVH. Patient is oriented x 4 and speaks in a low soft voice. Patient is observed to be disorganized at times as evidenced by struggling to answer questions. Patient will respond with prompting and redirection. Patient states "unsure if she would have done it but is feeling more hopeless". Patient is uncertain what is making her feel this way, states "its a variety of things." Patient reports having a hard time focusing and concentrating feeling she is "forgetting everything." She is feeling very anxious and states she has limited support. Patient is a widow and has been residing in an independent living facility. She has lived there for six months and feels like "people are out to get her to do bad things or hurt her." Patient will not elaborate on content of statement. Patient states she has a decreased appetite and has lost 10 lbs in the last month. Patient is currently switching medications from Paxil to Lexapro. Yesterday was her first day of only Lexapro and did not take today. Patient is also prescribed Xanax .5 mg daily. Patient is currently being prescribed medications from her provider at [facility]). Patient has a past psychiatric history of a overdose in November of 2019. She was in the hospital for 1 month inpatient at  [facility] in [place] being discharged on 06/16/18 . Since patient has been discharged she reports her depression has gotten worse with symptoms to include: feeling hopeless, deceased appetite, weight loss and trouble concentrating. She was diagnosed with MDD in 10/14/14 and has previously received OP services from [facility]. Patient has no children and her husband died in Oct 13, 2005. She has a  living sister and two brothers but does not see them very often.  Patient denies any alcohol or drug use. UDS is pending. Patient does not smoke. Case was staffed with Reita Cliche DNP who recommended a inpatient admission to stabilize.     Diagnosis: F33.2 MDD recurrent without psychotic features, severe    Past Medical History:  Past Medical History:  Diagnosis Date  . Anxiety   . Arthritis    rhematoid,osteoarthritis  . Diabetes mellitus without complication ([place])   . Elevated cholesterol   . Hemorrhoids   . Hypertension   . Hypothyroidism   . Joint pain   . Nocturia   . Sleeping difficulties     Past Surgical History:  Procedure Laterality Date  . ABDOMINAL HYSTERECTOMY    . BACK SURGERY    . CERVICAL FUSION  10/14/2002  . CHOLECYSTECTOMY    . EYE SURGERY Left    cataract  . PARS PLANA REPAIR OF RETINAL DEATACHMENT    . THYROIDECTOMY, PARTIAL      Family History:  Family History  Problem Relation Age of Onset  . Diabetes Mother   . Breast cancer Mother   . Gout Mother   . Alzheimer's disease Father   . Heart Problems Father   . Breast cancer Sister   . Alzheimer's disease Sister   . Diabetes Sister   . Gout Sister   . Heart Problems Sister   . Alzheimer's disease Brother   . Diabetes Brother   . Heart Problems Brother     Social History:  reports that she has never smoked. She has never used smokeless tobacco. She reports that she does not drink alcohol or use drugs.  Additional Social History:  Alcohol / Drug Use Pain Medications: See MAR Prescriptions: See MAR Over the Counter: See MAR History of alcohol / drug use?: No history of alcohol / drug abuse Longest period of sobriety (when/how long): NA Negative Consequences of Use: (NA) Withdrawal Symptoms: (NA)  CIWA:   COWS:    Allergies:  Allergies  Allergen Reactions  . Contrast Media [Iodinated Diagnostic Agents] Anaphylaxis and Hives    Only the retina dye;   . Abilify [Aripiprazole]   . Ultram  [Tramadol Hcl]   . Zithromax [Azithromycin]     Home Medications: (Not in a hospital admission)   OB/GYN Status:  No LMP recorded. Patient is postmenopausal.  General Assessment Data Location of Assessment: [facility] ED TTS Assessment: In system Is this a Tele or Face-to-Face Assessment?: Face-to-Face Is this an Initial Assessment or a Re-assessment for this encounter?: Initial Assessment Patient Accompanied by:: (NA) Language Other than English: No Living Arrangements: Other (Comment)(Assisted living) What gender do you identify as?: Female Marital status: Widowed Keener name: Flitton Pregnancy Status: No Living Arrangements: Other (Comment)(Assisted living) Can pt return to current living arrangement?: Yes Admission Status: Voluntary Is patient capable of signing voluntary admission?: Yes Referral Source: Self/Family/Friend Insurance type: Medicare     Crisis Care Plan Living Arrangements: Other (Comment)(Assisted living) Name of Psychiatrist: [facility] Name of Therapist: None  Education Status Is patient currently in school?: No Is the patient employed, unemployed or receiving disability?: Unemployed  Risk to self with the past 6 months Suicidal Ideation: Yes-Currently Present Has patient been a risk to self within the past 6 months prior to admission? : Yes Suicidal Intent: Yes-Currently Present Has patient had any suicidal intent within the past 6 months prior to admission? : Yes Is patient at risk for suicide?: Yes Suicidal Plan?: No Has patient had any suicidal plan within the past 6 months prior to admission? : Yes Specify Current Suicidal Plan: NA Access to Means: No(Pt does have medications but denies any plan) What has been your use of drugs/alcohol within the last 12 months?: Denies Previous Attempts/Gestures: Yes How many times?: 1 Other Self Harm Risks: NA Triggers for Past Attempts: Unknown Intentional Self Injurious Behavior: None Family Suicide  History: No Recent stressful life event(s): Other (Comment)(Recent medication changes) Persecutory voices/beliefs?: No Depression: Yes Depression Symptoms: Fatigue, Guilt, Feeling worthless/self pity Substance abuse history and/or treatment for substance abuse?: No Suicide prevention information given to non-admitted patients: Not applicable  Risk to Others within the past 6 months Homicidal Ideation: No Does patient have any lifetime risk of violence toward others beyond the six months prior to admission? : No Thoughts of Harm to Others: No Current Homicidal Intent: No Current Homicidal Plan: No Access to Homicidal Means: No Identified Victim: NA History of harm to others?: No Assessment of Violence: None Noted Violent Behavior Description: NA Does patient have access to weapons?: No Criminal Charges Pending?: No Does patient have a court date: No Is patient on probation?: No  Psychosis Hallucinations: None noted Delusions: None noted  Mental Status Report Appearance/Hygiene: Unremarkable Eye Contact: Fair Motor Activity: Freedom of movement Speech: Soft, Slow Level of Consciousness: Quiet/awake Mood: Depressed Affect: Appropriate to circumstance Anxiety Level: Minimal Thought Processes: Coherent, Relevant Judgement: Partial Orientation: Person, Place, Time Obsessive Compulsive Thoughts/Behaviors: None  Cognitive Functioning Concentration: Normal Memory: Recent Intact, Remote Intact Is patient IDD:  No Insight: Good Impulse Control: Fair Appetite: Good Have you had any weight changes? : No Change Sleep: No Change Total Hours of Sleep: 7 Vegetative Symptoms: None  ADLScreening [facility] Assessment Services) Patient's cognitive ability adequate to safely complete daily activities?: Yes Patient able to express need for assistance with ADLs?: Yes Independently performs ADLs?: Yes (appropriate for developmental age)  Prior Inpatient Therapy Prior Inpatient Therapy:  Yes Prior Therapy Dates: 2019 Prior Therapy Facilty/Provider(s): [facility] Reason for Treatment: MH issues  Prior Outpatient Therapy Prior Outpatient Therapy: Yes Prior Therapy Dates: Ongoing Prior Therapy Facilty/Provider(s): [facility] Reason for Treatment: Med mang Does patient have an ACCT team?: No Does patient have Intensive In-House Services?  : No Does patient have [facility] services? : No Does patient have P4CC services?: No  ADL Screening (condition at time of admission) Patient's cognitive ability adequate to safely complete daily activities?: Yes Is the patient deaf or have difficulty hearing?: No Does the patient have difficulty seeing, even when wearing glasses/contacts?: No Does the patient have difficulty concentrating, remembering, or making decisions?: No Patient able to express need for assistance with ADLs?: Yes Does the patient have difficulty dressing or bathing?: No Independently performs ADLs?: Yes (appropriate for developmental age) Does the patient have difficulty walking or climbing stairs?: No Weakness of Legs: None Weakness of Arms/Hands: None  Home Assistive Devices/Equipment Home Assistive Devices/Equipment: None  Therapy Consults (therapy consults require a physician order) PT Evaluation Needed: No OT Evalulation Needed: No SLP Evaluation Needed: No Abuse/Neglect Assessment (Assessment to be complete while patient is alone) Physical Abuse: Denies Verbal Abuse: Denies Sexual Abuse: Denies Exploitation of patient/patient's resources: Denies Self-Neglect: Denies Values / Beliefs Cultural Requests During Hospitalization: None Spiritual Requests During Hospitalization: None Consults Spiritual Care Consult Needed: No Social Work Consult Needed: No Regulatory affairs officer (For Healthcare) Does Patient Have a Medical Advance Directive?: No Would patient like information on creating a medical advance directive?: No - Patient declined           Disposition: Case was staffed with Reita Cliche DNP who recommended a inpatient admission to stabilize.    Disposition Initial Assessment Completed for this Encounter: Yes Disposition of Patient: Admit Type of inpatient treatment program: Adult Patient refused recommended treatment: No Mode of transportation if patient is discharged/movement?: (Unk)  On Site Evaluation by:   Reviewed with Physician:    Mamie Nick 08/09/2018 4:18 PM

## 2018-08-09 NOTE — ED Notes (Signed)
Bed: [facility] Hights Expected date:  Expected time:  Means of arrival:  Comments:

## 2018-08-10 ENCOUNTER — Telehealth: Payer: Self-pay | Admitting: Psychiatry

## 2018-08-10 ENCOUNTER — Encounter (HOSPITAL_COMMUNITY): Payer: Self-pay | Admitting: *Deleted

## 2018-08-10 ENCOUNTER — Other Ambulatory Visit: Payer: Self-pay

## 2018-08-10 ENCOUNTER — Inpatient Hospital Stay (HOSPITAL_COMMUNITY)
Admission: AD | Admit: 2018-08-10 | Discharge: 2018-08-16 | DRG: 885 | Disposition: A | Discharge status: Home / Self Care | Payer: Medicare Other | Source: Intra-hospital | Attending: Psychiatry | Admitting: Psychiatry

## 2018-08-10 ENCOUNTER — Encounter: Payer: Self-pay | Admitting: Psychiatry

## 2018-08-10 DIAGNOSIS — I10 Essential (primary) hypertension: Secondary | ICD-10-CM | POA: Diagnosis present

## 2018-08-10 DIAGNOSIS — Z818 Family history of other mental and behavioral disorders: Secondary | ICD-10-CM

## 2018-08-10 DIAGNOSIS — Z79899 Other long term (current) drug therapy: Secondary | ICD-10-CM

## 2018-08-10 DIAGNOSIS — Z981 Arthrodesis status: Secondary | ICD-10-CM

## 2018-08-10 DIAGNOSIS — Z9119 Patient's noncompliance with other medical treatment and regimen: Secondary | ICD-10-CM

## 2018-08-10 DIAGNOSIS — Z7952 Long term (current) use of systemic steroids: Secondary | ICD-10-CM | POA: Diagnosis not present

## 2018-08-10 DIAGNOSIS — E119 Type 2 diabetes mellitus without complications: Secondary | ICD-10-CM | POA: Diagnosis present

## 2018-08-10 DIAGNOSIS — E785 Hyperlipidemia, unspecified: Secondary | ICD-10-CM | POA: Diagnosis present

## 2018-08-10 DIAGNOSIS — M069 Rheumatoid arthritis, unspecified: Secondary | ICD-10-CM | POA: Diagnosis present

## 2018-08-10 DIAGNOSIS — R45851 Suicidal ideations: Secondary | ICD-10-CM

## 2018-08-10 DIAGNOSIS — F419 Anxiety disorder, unspecified: Secondary | ICD-10-CM | POA: Diagnosis present

## 2018-08-10 DIAGNOSIS — E039 Hypothyroidism, unspecified: Secondary | ICD-10-CM | POA: Diagnosis present

## 2018-08-10 DIAGNOSIS — G4700 Insomnia, unspecified: Secondary | ICD-10-CM | POA: Diagnosis present

## 2018-08-10 DIAGNOSIS — F332 Major depressive disorder, recurrent severe without psychotic features: Principal | ICD-10-CM | POA: Diagnosis present

## 2018-08-10 DIAGNOSIS — E7800 Pure hypercholesterolemia, unspecified: Secondary | ICD-10-CM | POA: Diagnosis present

## 2018-08-10 DIAGNOSIS — R413 Other amnesia: Secondary | ICD-10-CM | POA: Diagnosis not present

## 2018-08-10 DIAGNOSIS — F333 Major depressive disorder, recurrent, severe with psychotic symptoms: Secondary | ICD-10-CM | POA: Diagnosis not present

## 2018-08-10 DIAGNOSIS — Z90710 Acquired absence of both cervix and uterus: Secondary | ICD-10-CM

## 2018-08-10 LAB — TSH: TSH: 0.297 u[IU]/mL — AB (ref 0.350–4.500)

## 2018-08-10 LAB — LITHIUM LEVEL: Lithium Lvl: <0.06 mmol/L — ABNORMAL LOW (ref 0.60–1.20)

## 2018-08-10 MED ORDER — LISINOPRIL 10 MG PO TABS
10.0000 mg | ORAL_TABLET | Freq: Every day | ORAL | Status: DC
  Filled 2018-08-10 (×2): qty 1

## 2018-08-10 MED ORDER — MAGNESIUM HYDROXIDE 400 MG/5ML PO SUSP: 30.0000 mL | Freq: Every day | ORAL | Status: DC | PRN

## 2018-08-10 MED ORDER — LITHIUM CARBONATE 150 MG PO CAPS
150.0000 mg | ORAL_CAPSULE | Freq: Every day | ORAL | Status: DC
  Filled 2018-08-10 (×2): qty 1

## 2018-08-10 MED ORDER — HYDROCHLOROTHIAZIDE 12.5 MG PO CAPS
12.5000 mg | ORAL_CAPSULE | Freq: Every morning | ORAL | Status: DC
  Filled 2018-08-10 (×2): qty 1

## 2018-08-10 MED ORDER — ENSURE ENLIVE PO LIQD
237.0000 mL | Freq: Two times a day (BID) | ORAL | Status: DC
  Administered 2018-08-10 – 2018-08-11 (×2): 237 mL via ORAL

## 2018-08-10 MED ORDER — LISINOPRIL 10 MG PO TABS
10.0000 mg | ORAL_TABLET | Freq: Every day | ORAL | Status: DC
  Filled 2018-08-10: qty 1

## 2018-08-10 MED ORDER — ESCITALOPRAM OXALATE 20 MG PO TABS
20.0000 mg | ORAL_TABLET | Freq: Every day | ORAL | Status: DC
  Administered 2018-08-11 – 2018-08-16 (×6): 20 mg via ORAL
  Filled 2018-08-10 (×6): qty 1
  Filled 2018-08-10: qty 2
  Filled 2018-08-10 (×2): qty 1

## 2018-08-10 MED ORDER — LORATADINE 10 MG PO TABS
10.0000 mg | ORAL_TABLET | Freq: Every day | ORAL | Status: DC
  Administered 2018-08-11 – 2018-08-16 (×6): 10 mg via ORAL
  Filled 2018-08-10 (×9): qty 1

## 2018-08-10 MED ORDER — FOLIC ACID 1 MG PO TABS
1.0000 mg | ORAL_TABLET | Freq: Every day | ORAL | Status: DC
  Administered 2018-08-10 – 2018-08-16 (×7): 1 mg via ORAL
  Filled 2018-08-10 (×10): qty 1

## 2018-08-10 MED ORDER — OLANZAPINE 5 MG PO TBDP
2.5000 mg | ORAL_TABLET | Freq: Every day | ORAL | Status: DC
  Filled 2018-08-10 (×2): qty 0.5

## 2018-08-10 MED ORDER — ALUM & MAG HYDROXIDE-SIMETH 200-200-20 MG/5ML PO SUSP: 30.0000 mL | ORAL | Status: DC | PRN

## 2018-08-10 MED ORDER — HYDROXYCHLOROQUINE SULFATE 200 MG PO TABS
400.0000 mg | ORAL_TABLET | Freq: Every day | ORAL | Status: DC
  Administered 2018-08-11 – 2018-08-16 (×6): 400 mg via ORAL
  Filled 2018-08-10 (×8): qty 2

## 2018-08-10 MED ORDER — ACETAMINOPHEN 325 MG PO TABS
650.0000 mg | ORAL_TABLET | Freq: Four times a day (QID) | ORAL | Status: DC | PRN
  Administered 2018-08-11 – 2018-08-15 (×6): 650 mg via ORAL
  Filled 2018-08-10 (×6): qty 2

## 2018-08-10 MED ORDER — ATORVASTATIN CALCIUM 40 MG PO TABS
40.0000 mg | ORAL_TABLET | Freq: Every day | ORAL | Status: DC
  Administered 2018-08-11 – 2018-08-16 (×6): 40 mg via ORAL
  Filled 2018-08-10 (×9): qty 1

## 2018-08-10 MED ORDER — PREDNISONE 5 MG PO TABS
5.0000 mg | ORAL_TABLET | Freq: Every day | ORAL | Status: DC
  Administered 2018-08-11 – 2018-08-16 (×6): 5 mg via ORAL
  Filled 2018-08-10 (×9): qty 1

## 2018-08-10 MED ORDER — LEFLUNOMIDE 20 MG PO TABS
20.0000 mg | ORAL_TABLET | Freq: Every day | ORAL | Status: DC
  Administered 2018-08-11 – 2018-08-16 (×6): 20 mg via ORAL
  Filled 2018-08-10 (×8): qty 1

## 2018-08-10 MED ORDER — METOPROLOL SUCCINATE ER 25 MG PO TB24
25.0000 mg | ORAL_TABLET | Freq: Every day | ORAL | Status: DC
  Filled 2018-08-10 (×2): qty 1

## 2018-08-10 MED ORDER — PNEUMOCOCCAL VAC POLYVALENT 25 MCG/0.5ML IJ INJ: 0.5000 mL | INJECTION | INTRAMUSCULAR | Status: DC

## 2018-08-10 MED ORDER — POTASSIUM CHLORIDE CRYS ER 20 MEQ PO TBCR
40.0000 meq | EXTENDED_RELEASE_TABLET | Freq: Once | ORAL | Status: AC
  Administered 2018-08-10: 40 meq via ORAL
  Filled 2018-08-10 (×2): qty 2

## 2018-08-10 MED ORDER — METHOTREXATE 2.5 MG PO TABS
15.0000 mg | ORAL_TABLET | ORAL | Status: DC
  Administered 2018-08-14: 15 mg via ORAL
  Filled 2018-08-10 (×2): qty 6

## 2018-08-10 MED ORDER — RISPERIDONE 2 MG PO TBDP
2.0000 mg | ORAL_TABLET | Freq: Every day | ORAL | Status: DC
  Administered 2018-08-10 – 2018-08-12 (×3): 2 mg via ORAL
  Filled 2018-08-10 (×7): qty 1

## 2018-08-10 MED ORDER — ASPIRIN EC 81 MG PO TBEC
81.0000 mg | DELAYED_RELEASE_TABLET | Freq: Every day | ORAL | Status: DC
  Administered 2018-08-11 – 2018-08-16 (×6): 81 mg via ORAL
  Filled 2018-08-10 (×9): qty 1

## 2018-08-10 NOTE — [facility] Suicide Risk Assessment (Signed)
[facility] Admission Suicide Risk Assessment   Nursing information obtained from:  Patient Demographic factors:  Age 73 or older, Divorced or widowed, Caucasian, Living alone, Unemployed Current Mental Status:  NA Loss Factors:  Loss of significant relationship, Decline in physical health Historical Factors:  Prior suicide attempts, Family history of mental illness or substance abuse Risk Reduction Factors:  Positive coping skills or problem solving skills  Total Time spent with patient: 1 hour Principal Problem: <principal problem not specified> Diagnosis:  Active Problems:   MDD (major depressive disorder), recurrent episode, severe ([place])  Subjective Data: Patient is seen and examined.  Patient is a 73 year old female with a reported past psychiatric history significant for major depression with psychotic features who presented to the [facility] emergency department on 1/28 with depression and suicidal ideation.  The patient was recently seen by her primary psychiatric provider.  She had apparently told her therapist that she wanted take a handful of pills and end her life.  She stated she felt hopeless.  She endorsed symptoms of paranoia including being frightened by someone who lives down from her in her assisted care facility.  She also stated that she felt like there were listening devices in her apartment.  She felt as though her sister-in-law may have been somewhat involved with this.  She had a recent hospitalization secondary to an overdose in November 2019.  At that time she was hospitalized in the medical hospital for approximately 11 days, then transferred to the [facility]- psychiatric unit.  She was hospitalized there from 05/2211/4 2019.  She was discharged on Risperdal 2 mg p.o. nightly and Lexapro 20 mg p.o. daily.  She is also on multiple medications for rheumatoid arthritis, hypertension, hyperlipidemia and hypertension.  Review of the electronic  medical record revealed that she is been on multiple medication changes over the last 6 months.  She has been on Seroquel, Risperdal, Zyprexa, Abilify, Lexapro, Celexa, Paxil, Xanax, lithium and Depakote.  She stated that she is noncompliant with these medications because of her fear of them.  Notes in the chart were reviewed that showed that she had had problems psychiatrically since first grade where her sister once stated that she did not speak the first year of school.  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  She has not worked in approximately 10 years.  She is significantly anxious, and significantly paranoid.  She currently denied suicidal ideation at this time.  She was admitted to the hospital for evaluation and stabilization.  Continued Clinical Symptoms:  Alcohol Use Disorder Identification Test Final Score (AUDIT): 0 The "Alcohol Use Disorders Identification Test", Guidelines for Use in Primary Care, Second Edition.  World Pharmacologist [facility]). Score between 0-7:  no or low risk or alcohol related problems. Score between 8-15:  moderate risk of alcohol related problems. Score between 16-19:  high risk of alcohol related problems. Score 20 or above:  warrants further diagnostic evaluation for alcohol dependence and treatment.   CLINICAL FACTORS:   Severe Anxiety and/or Agitation Depression:   Anhedonia Delusional Hopelessness Impulsivity Insomnia Schizophrenia:   Paranoid or undifferentiated type   Musculoskeletal: Strength & Muscle Tone: within normal limits Gait & Station: normal Patient leans: N/A  Psychiatric Specialty Exam: Physical Exam  Constitutional: She is oriented to person, place, and time. She appears well-developed and well-nourished.  HENT:  Head: Normocephalic and atraumatic.  Respiratory: Effort normal.  Neurological: She is alert and oriented to  person, place, and time.    ROS  Blood pressure 96/75, pulse (!) 115, temperature 99.2 F (37.3 C), temperature source Oral, resp. rate 18, height 5\' 3"  (1.6 m), weight 60.3 kg.Body mass index is 23.56 kg/m.  General Appearance: Disheveled  Eye Contact:  Fair  Speech:  Normal Rate  Volume:  Decreased  Mood:  Anxious and Depressed  Affect:  Congruent  Thought Process:  Coherent and Descriptions of Associations: Circumstantial  Orientation:  Full (Time, Place, and Person)  Thought Content:  Delusions  Suicidal Thoughts:  Yes.  without intent/plan  Homicidal Thoughts:  No  Memory:  Immediate;   Fair Recent;   Fair Remote;   Fair  Judgement:  Impaired  Insight:  Lacking  Psychomotor Activity:  Increased  Concentration:  Concentration: Fair and Attention Span: Fair  Recall:  [employer] of Knowledge:  Fair  Language:  Fair  Akathisia:  Negative  Handed:  Right  AIMS (if indicated):     Assets:  Desire for Improvement Housing Resilience Social Support  ADL's:  Intact  Cognition:  WNL  Sleep:         COGNITIVE FEATURES THAT CONTRIBUTE TO RISK:  Thought constriction (tunnel vision)    SUICIDE RISK:   Mild:  Suicidal ideation of limited frequency, intensity, duration, and specificity.  There are no identifiable plans, no associated intent, mild dysphoria and related symptoms, good self-control (both objective and subjective assessment), few other risk factors, and identifiable protective factors, including available and accessible social support.  PLAN OF CARE: Patient is seen and examined.  Patient is a 73 year old female with the above-stated past psychiatric history who was admitted secondary to worsening paranoia, depression, anxiety and suicidal ideation.  She will be admitted to the hospital.  Should be integrated into the milieu.  I am not sure about her diagnosis of major depression with psychotic features.  She is been on multiple antidepressants, and although  noncompliant it does not appear as though any of those have really been beneficial.  She does appear to be in the thought disorder category.  She talks about paranoia for at least 10 to 12 years.  She has been on at least 3-5 antipsychotics, but she is not been compliant really with anything most recently.  Given the current situation the best thing I think we can do is return to what she was discharged on at [facility].  We will restart her Risperdal 2 mg p.o. nightly, and we will continue her Lexapro 20 mg p.o. daily.  She does state that she takes Xanax on an infrequent basis at home, and I will put in Xanax 0.5 mg p.o. twice daily as needed anxiety.  We will attempt to collect collateral information from her sister.  We will continue her medications as previously written for her rheumatoid arthritis, diabetes etc.  She apparently was supposed be taking lithium, but I am going to stop that currently.  Additionally she is on lisinopril but her pressure is actually fairly low.  We will monitor this.  She takes her methotrexate on Saturday, and I will write the order for that.  I will also write for her folic acid to be given daily.  She has a history of thyroid issues and she stated that sometimes is high, sometimes it is low.  This could be Hashimoto's given her immune systems.  I have ordered a TSH to assess whether that might  be part of the problem.  She does seem to have some degree of cognitive dysfunction.  She has had a CT scan of the brain and an MRI of the brain within the last 12 months, and basically showed microvascular disease.  Her blood sugars been stable, and her hemoglobin A1c was only 5.7 at the outside institution.  Hopefully we can help her do better.  I certify that inpatient services furnished can reasonably be expected to improve the patient's condition.   Sharma Covert, MD 08/10/2018, 4:29 PM

## 2018-08-10 NOTE — Progress Notes (Signed)
Adult Psychoeducational Group Note  Date:  08/10/2018 Time:  9:20 PM  Group Topic/Focus:  Wrap-Up Group:   The focus of this group is to help patients review their daily goal of treatment and discuss progress on daily workbooks.  Participation Level:  Active  Participation Quality:  Appropriate  Affect:  Appropriate  Cognitive:  Appropriate  Insight: Appropriate  Engagement in Group:  Engaged  Modes of Intervention:  Discussion  Additional Comments:  Patient attended group and participated.   [name] [name] 02/10/8568, 9:20 PM

## 2018-08-10 NOTE — Telephone Encounter (Signed)
See note

## 2018-08-10 NOTE — [facility] Group Notes (Signed)
Adult Psychoeducational Group Note  Date:  08/10/2018  Time:  4:15 PM  Group Topic/Focus: Coping Skills Patients are provided a list of 99 coping skills and discuss current/new activities to help manage stress. Patients discuss behaviors they can incorporate to help improve compliance with healthy coping skills and identify areas of strengths/weaknesses.  Participation Level:  Did Not Attend  Additional Comments:  Patient was invited but did not attend.  Estill Bamberg A Neeti Knudtson 08/10/2018 5:00 PM

## 2018-08-10 NOTE — ED Notes (Signed)
Patient to go to room 406-1 at 1pm.  Patient signed voluntary paperwork to go.

## 2018-08-10 NOTE — Telephone Encounter (Signed)
Tom from [facility] ER. called to inform you [name] was seen in the ER today. He stated reluctantly she signed herself into [employer]. For further info the contact #[phone]

## 2018-08-10 NOTE — Tx Team (Signed)
Initial Treatment Plan 08/10/2018 2:32 PM [name] [id number]    PATIENT STRESSORS: Health problems Medication change or noncompliance   PATIENT STRENGTHS: Ability for insight Average or above average intelligence Capable of independent living General fund of knowledge   PATIENT IDENTIFIED PROBLEMS: Depression Anxiety Suicidal thoughts "I don't think I would have taken the pills, I really don't"                     DISCHARGE CRITERIA:  Ability to meet basic life and health needs Improved stabilization in mood, thinking, and/or behavior Reduction of life-threatening or endangering symptoms to within safe limits Verbal commitment to aftercare and medication compliance  PRELIMINARY DISCHARGE PLAN: Attend aftercare/continuing care group Return to previous living arrangement  PATIENT/FAMILY INVOLVEMENT: This treatment plan has been presented to and reviewed with the patient, [name], and/or family member, .  The patient and family have been given the opportunity to ask questions and make suggestions.  [place], [place], [place] 08/10/2018, 2:32 PM

## 2018-08-10 NOTE — H&P (Signed)
Psychiatric Admission Assessment Adult  Patient Identification: [name] MRN:  [id number] Date of Evaluation:  08/10/2018 Chief Complaint:  MDD RECURRENT SEVERE Principal Diagnosis: <principal problem not specified> Diagnosis:  Active Problems:   MDD (major depressive disorder), recurrent episode, severe ([place])  History of Present Illness: Patient is seen and examined.  Patient is a 73 year old female with a reported past psychiatric history significant for major depression with psychotic features who presented to the [facility] emergency department on 1/28 with depression and suicidal ideation.  The patient was recently seen by her primary psychiatric provider.  She had apparently told her therapist that she wanted take a handful of pills and end her life.  She stated she felt hopeless.  She endorsed symptoms of paranoia including being frightened by someone who lives down from her in her assisted care facility.  She also stated that she felt like there were listening devices in her apartment.  She felt as though her sister-in-law may have been somewhat involved with this.  She had a recent hospitalization secondary to an overdose in November 2019.  At that time she was hospitalized in the medical hospital for approximately 11 days, then transferred to the [facility]- psychiatric unit.  She was hospitalized there from 05/2211/4 2019.  She was discharged on Risperdal 2 mg p.o. nightly and Lexapro 20 mg p.o. daily.  She is also on multiple medications for rheumatoid arthritis, hypertension, hyperlipidemia and hypertension.  Review of the electronic medical record revealed that she is been on multiple medication changes over the last 6 months.  She has been on Seroquel, Risperdal, Zyprexa, Abilify, Lexapro, Celexa, Paxil, Xanax, lithium and Depakote.  She stated that she is noncompliant with these medications because of her fear of them.  Notes in the chart  were reviewed that showed that she had had problems psychiatrically since first grade where her sister once stated that she did not speak the first year of school.  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  She has not worked in approximately 10 years.  She is significantly anxious, and significantly paranoid.  She currently denied suicidal ideation at this time.  She was admitted to the hospital for evaluation and stabilization.  Associated Signs/Symptoms: Depression Symptoms:  depressed mood, anhedonia, insomnia, psychomotor agitation, fatigue, feelings of worthlessness/guilt, difficulty concentrating, hopelessness, suicidal thoughts without plan, suicidal attempt, anxiety, loss of energy/fatigue, disturbed sleep, weight loss, (Hypo) Manic Symptoms:  Delusions, Impulsivity, Anxiety Symptoms:  Excessive Worry, Psychotic Symptoms:  Paranoia, PTSD Symptoms: Negative Total Time spent with patient: 1 hour  Past Psychiatric History: Patient has been followed as an outpatient psychiatrically for many years.  She has been treated for anxiety and depression.  It sounds as though the only psychiatric hospitalization that she had was the one in November 2019.  She was at [place]- psychiatric unit.  She has been on multiple medications in the past.  Please see the HPI for a fairly extensive list.  Is the patient at risk to self? Yes.    Has the patient been a risk to self in the past 6 months? Yes.    Has the patient been a risk to self within the distant past? No.  Is the patient a risk to others? No.  Has the patient been a risk to others in the past 6 months? No.  Has the patient  been a risk to others within the distant past? No.   Prior Inpatient Therapy:   Prior Outpatient Therapy:    Alcohol Screening: 1. How often do you have a drink containing alcohol?: Never 2.  How many drinks containing alcohol do you have on a typical day when you are drinking?: 1 or 2 3. How often do you have six or more drinks on one occasion?: Never AUDIT-C Score: 0 4. How often during the last year have you found that you were not able to stop drinking once you had started?: Never 5. How often during the last year have you failed to do what was normally expected from you becasue of drinking?: Never 6. How often during the last year have you needed a first drink in the morning to get yourself going after a heavy drinking session?: Never 7. How often during the last year have you had a feeling of guilt of remorse after drinking?: Never 8. How often during the last year have you been unable to remember what happened the night before because you had been drinking?: Never 9. Have you or someone else been injured as a result of your drinking?: No 10. Has a relative or friend or a doctor or another health worker been concerned about your drinking or suggested you cut down?: No Alcohol Use Disorder Identification Test Final Score (AUDIT): 0 Alcohol Brief Interventions/Follow-up: AUDIT Score <7 follow-up not indicated Substance Abuse History in the last 12 months:  No. Consequences of Substance Abuse: Negative Previous Psychotropic Medications: Yes  Psychological Evaluations: Yes  Past Medical History:  Past Medical History:  Diagnosis Date  . Anxiety   . Arthritis    rhematoid,osteoarthritis  . Diabetes mellitus without complication ([place])   . Elevated cholesterol   . Hemorrhoids   . Hypertension   . Hypothyroidism   . Joint pain   . Nocturia   . Sleeping difficulties     Past Surgical History:  Procedure Laterality Date  . ABDOMINAL HYSTERECTOMY    . BACK SURGERY    . CERVICAL FUSION  2004  . CHOLECYSTECTOMY    . EYE SURGERY Left    cataract  . PARS PLANA REPAIR OF RETINAL DEATACHMENT    . THYROIDECTOMY, PARTIAL     Family History:  Family History  Problem  Relation Age of Onset  . Diabetes Mother   . Breast cancer Mother   . Gout Mother   . Alzheimer's disease Father   . Heart Problems Father   . Breast cancer Sister   . Alzheimer's disease Sister   . Diabetes Sister   . Gout Sister   . Heart Problems Sister   . Alzheimer's disease Brother   . Diabetes Brother   . Heart Problems Brother    Family Psychiatric  History: Reportedly her brother has schizophrenia. Tobacco Screening: Have you used any form of tobacco in the last 30 days? (Cigarettes, Smokeless Tobacco, Cigars, and/or Pipes): No Social History:  Social History   Substance and Sexual Activity  Alcohol Use No     Social History   Substance and Sexual Activity  Drug Use No    Additional Social History:                           Allergies:   Allergies  Allergen Reactions  . Contrast Media [Iodinated Diagnostic Agents] Anaphylaxis and Hives    Only the retina dye;   . Abilify [Aripiprazole]  Pt dos not recall reaction  . Ultram [Tramadol Hcl]   . Zithromax [Azithromycin]     Anxiety heightened   Lab Results:  Results for orders placed or performed during the hospital encounter of 08/09/18 (from the past 48 hour(s))  CBC with Differential/Platelet     Status: Abnormal   Collection Time: 08/09/18  7:48 PM  Result Value Ref Range   WBC 7.5 4.0 - 10.5 K/uL   RBC 3.87 3.87 - 5.11 MIL/uL   Hemoglobin 12.7 12.0 - 15.0 g/dL   HCT 39.8 36.0 - 46.0 %   MCV 102.8 (H) 80.0 - 100.0 fL   MCH 32.8 26.0 - 34.0 pg   MCHC 31.9 30.0 - 36.0 g/dL   RDW 14.0 11.5 - 15.5 %   Platelets 154 150 - 400 K/uL   nRBC 0.0 0.0 - 0.2 %   Neutrophils Relative % 60 %   Neutro Abs 4.5 1.7 - 7.7 K/uL   Lymphocytes Relative 28 %   Lymphs Abs 2.1 0.7 - 4.0 K/uL   Monocytes Relative 9 %   Monocytes Absolute 0.7 0.1 - 1.0 K/uL   Eosinophils Relative 2 %   Eosinophils Absolute 0.1 0.0 - 0.5 K/uL   Basophils Relative 1 %   Basophils Absolute 0.1 0.0 - 0.1 K/uL   Immature  Granulocytes 0 %   Abs Immature Granulocytes 0.02 0.00 - 0.07 K/uL    Comment: Performed at [facility], [place] [address], [place], [place] [address]  Comprehensive metabolic panel     Status: Abnormal   Collection Time: 08/09/18  7:48 PM  Result Value Ref Range   Sodium 138 135 - 145 mmol/L   Potassium 3.2 (L) 3.5 - 5.1 mmol/L   Chloride 102 98 - 111 mmol/L   CO2 23 22 - 32 mmol/L   Glucose, Bld 111 (H) 70 - 99 mg/dL   BUN 29 (H) 8 - 23 mg/dL   Creatinine, Ser 0.92 0.44 - 1.00 mg/dL   Calcium 9.0 8.9 - 10.3 mg/dL   Total Protein 6.8 6.5 - 8.1 g/dL   Albumin 4.4 3.5 - 5.0 g/dL   AST 47 (H) 15 - 41 U/L   ALT 47 (H) 0 - 44 U/L   Alkaline Phosphatase 61 38 - 126 U/L   Total Bilirubin 0.6 0.3 - 1.2 mg/dL   GFR calc non Af Amer >60 >60 mL/min   GFR calc Af Amer >60 >60 mL/min   Anion gap 13 5 - 15    Comment: Performed at [facility], [place] [address], [place], [place] [address]  Ethanol     Status: None   Collection Time: 08/09/18  7:48 PM  Result Value Ref Range   Alcohol, Ethyl (B) <10 <10 mg/dL    Comment: (NOTE) Lowest detectable limit for serum alcohol is 10 mg/dL. For medical purposes only. Performed at [facility], [place] [address] [place], [place] [address]   Rapid urine drug screen (hospital performed)     Status: Abnormal   Collection Time: 08/09/18  7:48 PM  Result Value Ref Range   Opiates NONE DETECTED NONE DETECTED   Cocaine NONE DETECTED NONE DETECTED   Benzodiazepines POSITIVE (A) NONE DETECTED   Amphetamines NONE DETECTED NONE DETECTED   Tetrahydrocannabinol NONE DETECTED NONE DETECTED   Barbiturates NONE DETECTED NONE DETECTED    Comment: (NOTE) DRUG SCREEN FOR MEDICAL PURPOSES ONLY.  IF CONFIRMATION IS NEEDED FOR ANY PURPOSE, NOTIFY LAB WITHIN 5 DAYS. LOWEST  DETECTABLE LIMITS FOR URINE DRUG SCREEN Drug Class                     Cutoff (ng/mL) Amphetamine and metabolites    1000 Barbiturate and  metabolites    200 Benzodiazepine                 440 Tricyclics and metabolites     300 Opiates and metabolites        300 Cocaine and metabolites        300 THC                            50 Performed at [facility], [place] [address], [place], [place] [address]   Urinalysis, Routine w reflex microscopic     Status: Abnormal   Collection Time: 08/09/18  7:48 PM  Result Value Ref Range   Color, Urine AMBER (A) YELLOW    Comment: BIOCHEMICALS MAY BE AFFECTED BY COLOR   APPearance CLOUDY (A) CLEAR   Specific Gravity, Urine 1.025 1.005 - 1.030   pH 5.0 5.0 - 8.0   Glucose, UA NEGATIVE NEGATIVE mg/dL   Hgb urine dipstick LARGE (A) NEGATIVE   Bilirubin Urine NEGATIVE NEGATIVE   Ketones, ur 20 (A) NEGATIVE mg/dL   Protein, ur 100 (A) NEGATIVE mg/dL   Nitrite NEGATIVE NEGATIVE   Leukocytes, UA TRACE (A) NEGATIVE   RBC / HPF >50 (H) 0 - 5 RBC/hpf   WBC, UA 6-10 0 - 5 WBC/hpf   Bacteria, UA RARE (A) NONE SEEN   Squamous Epithelial / LPF 0-5 0 - 5   Mucus PRESENT     Comment: Performed at [facility], [place] [address] [place], [place] [address]  Acetaminophen level     Status: Abnormal   Collection Time: 08/09/18  7:48 PM  Result Value Ref Range   Acetaminophen (Tylenol), Serum <10 (L) 10 - 30 ug/mL    Comment: (NOTE) Therapeutic concentrations vary significantly. A range of 10-30 ug/mL  may be an effective concentration for many patients. However, some  are best treated at concentrations outside of this range. Acetaminophen concentrations >150 ug/mL at 4 hours after ingestion  and >50 ug/mL at 12 hours after ingestion are often associated with  toxic reactions. Performed at [facility], [place] [address], [place], [place] [address]   Salicylate level     Status: None   Collection Time: 08/09/18  7:48 PM  Result Value Ref Range   Salicylate Lvl <3.3 2.8 - 30.0 mg/dL    Comment: Performed at [facility],  [place] [address] [place], [place] [address]    Blood Alcohol level:  Lab Results  Component Value Date   ETH <10 08/09/2018   ETH <10 84/16/6063    Metabolic Disorder Labs:  Lab Results  Component Value Date   HGBA1C 5.3 05/25/2018   MPG 105.41 05/25/2018   No results found for: PROLACTIN No results found for: CHOL, TRIG, HDL, CHOLHDL, VLDL, LDLCALC  Current Medications: Current Facility-Administered Medications  Medication Dose Route Frequency Provider Last Rate Last Dose  . acetaminophen (TYLENOL) tablet 650 mg  650 mg Oral Q6H PRN Suella Broad, FNP      . alum & mag hydroxide-simeth (MAALOX/MYLANTA) 200-200-20 MG/5ML suspension 30 mL  30 mL Oral Q4H PRN Burt Ek, Gayland Curry, FNP      . [START ON 08/11/2018] aspirin EC tablet 81  mg  81 mg Oral Daily Suella Broad, FNP      . [START ON 08/11/2018] atorvastatin (LIPITOR) tablet 40 mg  40 mg Oral Daily Suella Broad, FNP      . [START ON 08/11/2018] escitalopram (LEXAPRO) tablet 20 mg  20 mg Oral Daily Suella Broad, FNP      . feeding supplement (ENSURE ENLIVE) (ENSURE ENLIVE) liquid 237 mL  237 mL Oral BID BM Sharma Covert, MD      . folic acid (FOLVITE) tablet 1 mg  1 mg Oral Daily Starkes-Perry, Gayland Curry, FNP      . [START ON 08/11/2018] hydrochlorothiazide (MICROZIDE) capsule 12.5 mg  12.5 mg Oral q morning - 10a Starkes-Perry, Gayland Curry, FNP      . [START ON 08/11/2018] hydroxychloroquine (PLAQUENIL) tablet 400 mg  400 mg Oral Daily Suella Broad, FNP      . [START ON 08/11/2018] leflunomide (ARAVA) tablet 20 mg  20 mg Oral Daily Suella Broad, FNP      . [START ON 08/11/2018] loratadine (CLARITIN) tablet 10 mg  10 mg Oral Daily Starkes-Perry, Takia S, FNP      . magnesium hydroxide (MILK OF MAGNESIA) suspension 30 mL  30 mL Oral Daily PRN Starkes-Perry, Gayland Curry, FNP      . methotrexate (RHEUMATREX) tablet 15 mg  15 mg Oral Weekly Sharma Covert, MD      . Derrill Memo ON  08/11/2018] metoprolol succinate (TOPROL-XL) 24 hr tablet 25 mg  25 mg Oral Daily Suella Broad, FNP      . [START ON 08/11/2018] pneumococcal 23 valent vaccine (PNU-IMMUNE) injection 0.5 mL  0.5 mL Intramuscular Tomorrow-1000 Sharma Covert, MD      . potassium chloride SA (K-DUR,KLOR-CON) CR tablet 40 mEq  40 mEq Oral Once Suella Broad, FNP      . [START ON 08/11/2018] predniSONE (DELTASONE) tablet 5 mg  5 mg Oral Q breakfast Starkes-Perry, Gayland Curry, FNP      . risperiDONE (RISPERDAL M-TABS) disintegrating tablet 2 mg  2 mg Oral QHS Mallie Darting Cordie Grice, MD       PTA Medications: Medications Prior to Admission  Medication Sig Dispense Refill Last Dose  . ACCU-CHEK GUIDE test strip CHECK BLOOD SUGAR ONCE A DAY  3 Taking  . folic acid (FOLVITE) 1 MG tablet Take 1 mg by mouth daily.    08/08/2018 at Unknown time  . hydrochlorothiazide (HYDRODIURIL) 12.5 MG tablet Take 12.5 mg by mouth every morning.   08/08/2018 at Unknown time  . hydroxychloroquine (PLAQUENIL) 200 MG tablet Take 400 mg by mouth daily.   08/08/2018 at Unknown time  . leflunomide (ARAVA) 20 MG tablet Take 20 mg by mouth daily.   08/08/2018 at Unknown time  . lisinopril (PRINIVIL,ZESTRIL) 10 MG tablet Take 10 mg by mouth daily.  1 08/08/2018 at Unknown time  . lithium carbonate 150 MG capsule Take 1 capsule (150 mg total) by mouth daily. (Patient not taking: Reported on 08/09/2018) 30 capsule 0 Not Taking at Unknown time  . OLANZapine zydis (ZYPREXA) 5 MG disintegrating tablet Take 0.5 tablets (2.5 mg total) by mouth at bedtime. (Patient not taking: Reported on 08/09/2018) 30 tablet 2 Not Taking at Unknown time  . PARoxetine (PAXIL) 40 MG tablet 1/2 tab for a week, then stop (Patient not taking: Reported on 08/09/2018) 7 tablet 0 Not Taking at Unknown time  . predniSONE (DELTASONE) 5 MG tablet Take 5 mg by mouth daily with  breakfast.   08/08/2018 at Unknown time  . risperiDONE (RISPERDAL) 1 MG tablet 1 pill a day for 3 days,  then 2 a day (Patient not taking: Reported on 08/09/2018) 60 tablet 0 Not Taking at Unknown time    Musculoskeletal: Strength & Muscle Tone: within normal limits Gait & Station: normal Patient leans: N/A  Psychiatric Specialty Exam: Physical Exam  Nursing note and vitals reviewed. Constitutional: She is oriented to person, place, and time. She appears well-developed and well-nourished.  HENT:  Head: Normocephalic and atraumatic.  Respiratory: Effort normal.  Neurological: She is alert and oriented to person, place, and time.    ROS  Blood pressure 96/75, pulse (!) 115, temperature 99.2 F (37.3 C), temperature source Oral, resp. rate 18, height 5\' 3"  (1.6 m), weight 60.3 kg.Body mass index is 23.56 kg/m.  General Appearance: Disheveled  Eye Contact:  Fair  Speech:  Normal Rate  Volume:  Decreased  Mood:  Anxious and Depressed  Affect:  Congruent  Thought Process:  Coherent and Descriptions of Associations: Circumstantial  Orientation:  Full (Time, Place, and Person)  Thought Content:  Delusions and Paranoid Ideation  Suicidal Thoughts:  Yes.  without intent/plan  Homicidal Thoughts:  No  Memory:  Immediate;   Fair Recent;   Fair Remote;   Fair  Judgement:  Impaired  Insight:  Lacking  Psychomotor Activity:  Increased  Concentration:  Concentration: Fair and Attention Span: Fair  Recall:  [employer] of Knowledge:  Fair  Language:  Good  Akathisia:  Negative  Handed:  Right  AIMS (if indicated):     Assets:  Desire for Improvement Financial Resources/Insurance Housing Resilience Social Support  ADL's:  Intact  Cognition:  WNL  Sleep:       Treatment Plan Summary: Daily contact with patient to assess and evaluate symptoms and progress in treatment, Medication management and Plan : Patient is seen and examined.  Patient is a 73 year old female with the above-stated past psychiatric history who was admitted secondary to worsening paranoia, depression, anxiety and  suicidal ideation.  She will be admitted to the hospital.  Should be integrated into the milieu.  I am not sure about her diagnosis of major depression with psychotic features.  She is been on multiple antidepressants, and although noncompliant it does not appear as though any of those have really been beneficial.  She does appear to be in the thought disorder category.  She talks about paranoia for at least 10 to 12 years.  She has been on at least 3-5 antipsychotics, but she is not been compliant really with anything most recently.  Given the current situation the best thing I think we can do is return to what she was discharged on at [facility].  We will restart her Risperdal 2 mg p.o. nightly, and we will continue her Lexapro 20 mg p.o. daily.  She does state that she takes Xanax on an infrequent basis at home, and I will put in Xanax 0.5 mg p.o. twice daily as needed anxiety.  We will attempt to collect collateral information from her sister.  We will continue her medications as previously written for her rheumatoid arthritis, diabetes etc.  She apparently was supposed be taking lithium, but I am going to stop that currently.  Additionally she is on lisinopril but her pressure is actually fairly low.  We will monitor this.  She takes her methotrexate on Saturday, and I will write the order for that.  I will also write  for her folic acid to be given daily.  She has a history of thyroid issues and she stated that sometimes is high, sometimes it is low.  This could be Hashimoto's given her immune systems.  I have ordered a TSH to assess whether that might be part of the problem.  She does seem to have some degree of cognitive dysfunction.  She has had a CT scan of the brain and an MRI of the brain within the last 12 months, and basically showed microvascular disease.  Her blood sugars been stable, and her hemoglobin A1c was only 5.7 at the outside institution.  Hopefully we can help her do better.  Observation  Level/Precautions:  15 minute checks  Laboratory:  Chemistry Profile  Psychotherapy:    Medications:    Consultations:    Discharge Concerns:    Estimated LOS:  Other:     Physician Treatment Plan for Primary Diagnosis: <principal problem not specified> Long Term Goal(s): Improvement in symptoms so as ready for discharge  Short Term Goals: Ability to identify changes in lifestyle to reduce recurrence of condition will improve, Ability to verbalize feelings will improve, Ability to disclose and discuss suicidal ideas, Ability to demonstrate self-control will improve, Ability to identify and develop effective coping behaviors will improve, Ability to maintain clinical measurements within normal limits will improve and Compliance with prescribed medications will improve  Physician Treatment Plan for Secondary Diagnosis: Active Problems:   MDD (major depressive disorder), recurrent episode, severe ([place])  Long Term Goal(s): Improvement in symptoms so as ready for discharge  Short Term Goals: Ability to identify changes in lifestyle to reduce recurrence of condition will improve, Ability to verbalize feelings will improve, Ability to disclose and discuss suicidal ideas, Ability to demonstrate self-control will improve, Ability to identify and develop effective coping behaviors will improve, Ability to maintain clinical measurements within normal limits will improve and Compliance with prescribed medications will improve  I certify that inpatient services furnished can reasonably be expected to improve the patient's condition.    Sharma Covert, MD 1/28/20204:41 PM

## 2018-08-10 NOTE — BH Assessment (Addendum)
[facility] Assessment Progress Note  Per Buford Dresser, DO, this pt requires psychiatric hospitalization at this time.  Drake Leach, RN, [facility] has assigned pt to [facility] Rm [id number]; [facility] will be ready to receive pt at 13:00.  Pt has reluctantly signed Voluntary Admission and Consent for Treatment, as well as Consent to Release Information to her provider at [facility], and a notification call has been placed.  Signed forms have been faxed to [facility].  Pt's nurse, Eimi, has been notified, and agrees to send original paperwork along with pt via [facility], and to call report to [phone].  Jalene Mullet, [place] Coordinator [phone]

## 2018-08-10 NOTE — ED Notes (Signed)
[facility] called for transport. 

## 2018-08-10 NOTE — Progress Notes (Signed)
Recreation Therapy Notes  Animal-Assisted Activity (AAA) Program Checklist/Progress Notes Patient Eligibility Criteria Checklist & Daily Group note for Rec Tx Intervention  Date: 1.28.20 Time: 1430 Location: [address] Dayroom   AAA/T Program Assumption of Risk Form signed by Patient/ or Parent Legal Guardian  YES   Patient is free of allergies or sever asthma  YES   Patient reports no fear of animals  YES   Patient reports no history of cruelty to animals  YES   Patient understands his/her participation is voluntary YES   Patient washes hands before animal contact  YES   Patient washes hands after animal contact  YES   Behavioral Response:  Engaged  Education: Hand Washing, Appropriate Animal Interaction   Education Outcome: Acknowledges understanding/In group clarification offered/Needs additional education.   Clinical Observations/Feedback: Pt attended and participated.    Salah Nakamura, LRT/CTRS         Nemiah Bubar A 08/10/2018 3:37 PM 

## 2018-08-10 NOTE — Consult Note (Signed)
[facility] Consult   Reason for Consult:  Depression and SI Referring Physician:   Patient Identification: [name] MRN:  [id number] Principal Diagnosis: MDD (major depressive disorder), recurrent, severe, with psychosis ([place]) Diagnosis:  Principal Problem:   MDD (major depressive disorder), recurrent, severe, with psychosis ([place])   Total Time spent with patient: 30 minutes  Subjective:   [name] is a 73 y.o. female patient admitted with depression and SI.  HPI:   Per chart review, patient was admitted to the hospital with depression and SI. She told her therapist that she wanted to take a handful of pills to end her life. She reports feeling hopeless. She endorsed symptoms of paranoia. On interview, she reports that she briefly felt better after discharge from [facility]. She has been feeling suicidal for a couple days. She has thoughts of overdosing on her medications although she does not have any intention. She reports fluctuations in her sleep and appetite. She reports initially losing 40 pounds over several months. She believes that she has gained some of her weight back.    Past Psychiatric History: Mood disorder, OCD, anxiety and depression.   Risk to Self: Suicidal Ideation: Yes-Currently Present Suicidal Intent: Yes-Currently Present Is patient at risk for suicide?: Yes Suicidal Plan?: No Specify Current Suicidal Plan: NA Access to Means: No(Pt does have medications but denies any plan) What has been your use of drugs/alcohol within the last 12 months?: Denies How many times?: 1 Other Self Harm Risks: NA Triggers for Past Attempts: Unknown Intentional Self Injurious Behavior: None Risk to Others: Homicidal Ideation: No Thoughts of Harm to Others: No Current Homicidal Intent: No Current Homicidal Plan: No Access to Homicidal Means: No Identified Victim: NA History of harm to others?: No Assessment of Violence: None Noted Violent  Behavior Description: NA Does patient have access to weapons?: No Criminal Charges Pending?: No Does patient have a court date: No Prior Inpatient Therapy: Prior Inpatient Therapy: Yes Prior Therapy Dates: 2019 Prior Therapy Facilty/Provider(s): [facility] Reason for Treatment: MH issues Prior Outpatient Therapy: Prior Outpatient Therapy: Yes Prior Therapy Dates: Ongoing Prior Therapy Facilty/Provider(s): [facility] Reason for Treatment: Med mang Does patient have an ACCT team?: No Does patient have Intensive In-House Services?  : No Does patient have [facility] services? : No Does patient have P4CC services?: No  Past Medical History:  Past Medical History:  Diagnosis Date  . Anxiety   . Arthritis    rhematoid,osteoarthritis  . Diabetes mellitus without complication ([place])   . Elevated cholesterol   . Hemorrhoids   . Hypertension   . Hypothyroidism   . Joint pain   . Nocturia   . Sleeping difficulties     Past Surgical History:  Procedure Laterality Date  . ABDOMINAL HYSTERECTOMY    . BACK SURGERY    . CERVICAL FUSION  2004  . CHOLECYSTECTOMY    . EYE SURGERY Left    cataract  . PARS PLANA REPAIR OF RETINAL DEATACHMENT    . THYROIDECTOMY, PARTIAL     Family History:  Family History  Problem Relation Age of Onset  . Diabetes Mother   . Breast cancer Mother   . Gout Mother   . Alzheimer's disease Father   . Heart Problems Father   . Breast cancer Sister   . Alzheimer's disease Sister   . Diabetes Sister   . Gout Sister   . Heart Problems Sister   . Alzheimer's disease Brother   . Diabetes Brother   .  Heart Problems Brother    Family Psychiatric  History: As listed above.  Social History:  Social History   Substance and Sexual Activity  Alcohol Use No     Social History   Substance and Sexual Activity  Drug Use No    Social History   Socioeconomic History  . Marital status: Widowed    Spouse name: Not on file  . Number of children: 0  . Years  of education: HS  . Highest education level: Not on file  Occupational History  . Not on file  Social Needs  . Financial resource strain: Not on file  . Food insecurity:    Worry: Not on file    Inability: Not on file  . Transportation needs:    Medical: Not on file    Non-medical: Not on file  Tobacco Use  . Smoking status: Never Smoker  . Smokeless tobacco: Never Used  Substance and Sexual Activity  . Alcohol use: No  . Drug use: No  . Sexual activity: Not on file  Lifestyle  . Physical activity:    Days per week: Not on file    Minutes per session: Not on file  . Stress: Not on file  Relationships  . Social connections:    Talks on phone: Not on file    Gets together: Not on file    Attends religious service: Not on file    Active member of club or organization: Not on file    Attends meetings of clubs or organizations: Not on file    Relationship status: Not on file  Other Topics Concern  . Not on file  Social History Narrative   Drinks 3 caffeine drinks a day    Additional Social History: She lives in an independent living facility.     Allergies:   Allergies  Allergen Reactions  . Contrast Media [Iodinated Diagnostic Agents] Anaphylaxis and Hives    Only the retina dye;   . Abilify [Aripiprazole]     Pt dos not recall reaction  . Ultram [Tramadol Hcl]   . Zithromax [Azithromycin]     Anxiety heightened    Labs:  Results for orders placed or performed during the hospital encounter of 08/09/18 (from the past 48 hour(s))  CBC with Differential/Platelet     Status: Abnormal   Collection Time: 08/09/18  7:48 PM  Result Value Ref Range   WBC 7.5 4.0 - 10.5 K/uL   RBC 3.87 3.87 - 5.11 MIL/uL   Hemoglobin 12.7 12.0 - 15.0 g/dL   HCT 39.8 36.0 - 46.0 %   MCV 102.8 (H) 80.0 - 100.0 fL   MCH 32.8 26.0 - 34.0 pg   MCHC 31.9 30.0 - 36.0 g/dL   RDW 14.0 11.5 - 15.5 %   Platelets 154 150 - 400 K/uL   nRBC 0.0 0.0 - 0.2 %   Neutrophils Relative % 60 %    Neutro Abs 4.5 1.7 - 7.7 K/uL   Lymphocytes Relative 28 %   Lymphs Abs 2.1 0.7 - 4.0 K/uL   Monocytes Relative 9 %   Monocytes Absolute 0.7 0.1 - 1.0 K/uL   Eosinophils Relative 2 %   Eosinophils Absolute 0.1 0.0 - 0.5 K/uL   Basophils Relative 1 %   Basophils Absolute 0.1 0.0 - 0.1 K/uL   Immature Granulocytes 0 %   Abs Immature Granulocytes 0.02 0.00 - 0.07 K/uL    Comment: Performed at [facility], [place] [address] [place],  [place] [address]  Comprehensive metabolic panel     Status: Abnormal   Collection Time: 08/09/18  7:48 PM  Result Value Ref Range   Sodium 138 135 - 145 mmol/L   Potassium 3.2 (L) 3.5 - 5.1 mmol/L   Chloride 102 98 - 111 mmol/L   CO2 23 22 - 32 mmol/L   Glucose, Bld 111 (H) 70 - 99 mg/dL   BUN 29 (H) 8 - 23 mg/dL   Creatinine, Ser 0.92 0.44 - 1.00 mg/dL   Calcium 9.0 8.9 - 10.3 mg/dL   Total Protein 6.8 6.5 - 8.1 g/dL   Albumin 4.4 3.5 - 5.0 g/dL   AST 47 (H) 15 - 41 U/L   ALT 47 (H) 0 - 44 U/L   Alkaline Phosphatase 61 38 - 126 U/L   Total Bilirubin 0.6 0.3 - 1.2 mg/dL   GFR calc non Af Amer >60 >60 mL/min   GFR calc Af Amer >60 >60 mL/min   Anion gap 13 5 - 15    Comment: Performed at [facility], [place] [address] [place], [place] [address]  Ethanol     Status: None   Collection Time: 08/09/18  7:48 PM  Result Value Ref Range   Alcohol, Ethyl (B) <10 <10 mg/dL    Comment: (NOTE) Lowest detectable limit for serum alcohol is 10 mg/dL. For medical purposes only. Performed at [facility], [place] [address], [place], [place] [address]   Rapid urine drug screen (hospital performed)     Status: Abnormal   Collection Time: 08/09/18  7:48 PM  Result Value Ref Range   Opiates NONE DETECTED NONE DETECTED   Cocaine NONE DETECTED NONE DETECTED   Benzodiazepines POSITIVE (A) NONE DETECTED   Amphetamines NONE DETECTED NONE DETECTED   Tetrahydrocannabinol NONE DETECTED NONE DETECTED   Barbiturates NONE  DETECTED NONE DETECTED    Comment: (NOTE) DRUG SCREEN FOR MEDICAL PURPOSES ONLY.  IF CONFIRMATION IS NEEDED FOR ANY PURPOSE, NOTIFY LAB WITHIN 5 DAYS. LOWEST DETECTABLE LIMITS FOR URINE DRUG SCREEN Drug Class                     Cutoff (ng/mL) Amphetamine and metabolites    1000 Barbiturate and metabolites    200 Benzodiazepine                 428 Tricyclics and metabolites     300 Opiates and metabolites        300 Cocaine and metabolites        300 THC                            50 Performed at [facility], [place] [address] [place], [place] [address]   Urinalysis, Routine w reflex microscopic     Status: Abnormal   Collection Time: 08/09/18  7:48 PM  Result Value Ref Range   Color, Urine AMBER (A) YELLOW    Comment: BIOCHEMICALS MAY BE AFFECTED BY COLOR   APPearance CLOUDY (A) CLEAR   Specific Gravity, Urine 1.025 1.005 - 1.030   pH 5.0 5.0 - 8.0   Glucose, UA NEGATIVE NEGATIVE mg/dL   Hgb urine dipstick LARGE (A) NEGATIVE   Bilirubin Urine NEGATIVE NEGATIVE   Ketones, ur 20 (A) NEGATIVE mg/dL   Protein, ur 100 (A) NEGATIVE mg/dL   Nitrite NEGATIVE NEGATIVE   Leukocytes, UA TRACE (A) NEGATIVE   RBC / HPF >50 (  H) 0 - 5 RBC/hpf   WBC, UA 6-10 0 - 5 WBC/hpf   Bacteria, UA RARE (A) NONE SEEN   Squamous Epithelial / LPF 0-5 0 - 5   Mucus PRESENT     Comment: Performed at [facility], [place] [address], [place], [place] [address]  Acetaminophen level     Status: Abnormal   Collection Time: 08/09/18  7:48 PM  Result Value Ref Range   Acetaminophen (Tylenol), Serum <10 (L) 10 - 30 ug/mL    Comment: (NOTE) Therapeutic concentrations vary significantly. A range of 10-30 ug/mL  may be an effective concentration for many patients. However, some  are best treated at concentrations outside of this range. Acetaminophen concentrations >150 ug/mL at 4 hours after ingestion  and >50 ug/mL at 12 hours after ingestion are often associated with   toxic reactions. Performed at [facility], [place] [address], [place], [place] [address]   Salicylate level     Status: None   Collection Time: 08/09/18  7:48 PM  Result Value Ref Range   Salicylate Lvl <1.1 2.8 - 30.0 mg/dL    Comment: Performed at [facility], [place] [address] [place], [place] [address]    Current Facility-Administered Medications  Medication Dose Route Frequency Provider Last Rate Last Dose  . acetaminophen (TYLENOL) tablet 650 mg  650 mg Oral Q6H PRN Julianne Rice, MD      . aspirin EC tablet 81 mg  81 mg Oral Daily Julianne Rice, MD   81 mg at 08/10/18 1049  . atorvastatin (LIPITOR) tablet 40 mg  40 mg Oral Daily Julianne Rice, MD   40 mg at 08/10/18 1050  . escitalopram (LEXAPRO) tablet 20 mg  20 mg Oral Daily Patrecia Pour, NP   20 mg at 08/10/18 1049  . hydrochlorothiazide (MICROZIDE) capsule 12.5 mg  12.5 mg Oral q morning - 10a Julianne Rice, MD   12.5 mg at 08/10/18 1050  . hydroxychloroquine (PLAQUENIL) tablet 400 mg  400 mg Oral Daily Julianne Rice, MD   400 mg at 08/10/18 1050  . leflunomide (ARAVA) tablet 20 mg  20 mg Oral Daily Julianne Rice, MD   20 mg at 08/10/18 1050  . lisinopril (PRINIVIL,ZESTRIL) tablet 10 mg  10 mg Oral Daily Julianne Rice, MD   10 mg at 08/10/18 1049  . loratadine (CLARITIN) tablet 10 mg  10 mg Oral Daily Julianne Rice, MD   10 mg at 08/10/18 1050  . metoprolol succinate (TOPROL-XL) 24 hr tablet 25 mg  25 mg Oral Daily Julianne Rice, MD   25 mg at 08/10/18 1050  . potassium chloride SA (K-DUR,KLOR-CON) CR tablet 40 mEq  40 mEq Oral Once Julianne Rice, MD      . predniSONE (DELTASONE) tablet 5 mg  5 mg Oral Q breakfast Julianne Rice, MD   5 mg at 08/10/18 2956   Current Outpatient Medications  Medication Sig Dispense Refill  . acetaminophen (TYLENOL) 325 MG tablet Take 2 tablets (650 mg total) by mouth every 6 (six) hours as needed for moderate pain (headache).  20 tablet 1  . albuterol (PROVENTIL HFA;VENTOLIN HFA) 108 (90 Base) MCG/ACT inhaler Inhale 1-2 puffs into the lungs every 6 (six) hours as needed for wheezing or shortness of breath.    Marland Kitchen aspirin EC 81 MG tablet Take 81 mg by mouth daily.    Marland Kitchen atorvastatin (LIPITOR) 40 MG tablet Take 40 mg by mouth daily.    . cetirizine (ZYRTEC)  10 MG tablet Take 10 mg by mouth daily as needed for allergies.    . Cholecalciferol (VITAMIN D) 2000 UNITS tablet Take 2,000 Units by mouth daily.    . cycloSPORINE (RESTASIS) 0.05 % ophthalmic emulsion Place 1 drop into both eyes 2 (two) times daily.    Marland Kitchen escitalopram (LEXAPRO) 20 MG tablet 1/2 tab a day for 1 week, then 1 per day (Patient taking differently: Take 20 mg by mouth daily. 1/2 tab a day for 1 week, then 1 per day) 30 tablet 0  . fluticasone (FLONASE) 50 MCG/ACT nasal spray Place 1-2 sprays into both nostrils daily as needed for allergies.     . folic acid (FOLVITE) 1 MG tablet Take 1 mg by mouth daily.     . hydrochlorothiazide (HYDRODIURIL) 12.5 MG tablet Take 12.5 mg by mouth every morning.    . hydroxychloroquine (PLAQUENIL) 200 MG tablet Take 400 mg by mouth daily.    Marland Kitchen leflunomide (ARAVA) 20 MG tablet Take 20 mg by mouth daily.    Marland Kitchen lisinopril (PRINIVIL,ZESTRIL) 10 MG tablet Take 10 mg by mouth daily.  1  . methotrexate (RHEUMATREX) 2.5 MG tablet 15 mg once a week. Wednesday    . metoprolol succinate (TOPROL-XL) 25 MG 24 hr tablet Take 25 mg by mouth daily.    . predniSONE (DELTASONE) 5 MG tablet Take 5 mg by mouth daily with breakfast.    . ACCU-CHEK GUIDE test strip CHECK BLOOD SUGAR ONCE A DAY  3  . feeding supplement, ENSURE ENLIVE, (ENSURE ENLIVE) LIQD Take 237 mLs by mouth daily. (Patient not taking: Reported on 08/09/2018) 237 mL 12  . HUMIRA PEDIATRIC CROHNS START 40 MG/0.8ML PSKT Inject 1 each into the skin every 14 (fourteen) days.     Marland Kitchen lithium carbonate 150 MG capsule Take 1 capsule (150 mg total) by mouth daily. (Patient not taking:  Reported on 08/09/2018) 30 capsule 0  . Multiple Vitamin (MULTIVITAMIN WITH MINERALS) TABS tablet Take 1 tablet by mouth daily. (Patient not taking: Reported on 08/09/2018) 30 tablet 3  . OLANZapine zydis (ZYPREXA) 5 MG disintegrating tablet Take 0.5 tablets (2.5 mg total) by mouth at bedtime. (Patient not taking: Reported on 08/09/2018) 30 tablet 2  . PARoxetine (PAXIL) 40 MG tablet 1/2 tab for a week, then stop (Patient not taking: Reported on 08/09/2018) 7 tablet 0  . risperiDONE (RISPERDAL) 1 MG tablet 1 pill a day for 3 days, then 2 a day (Patient not taking: Reported on 08/09/2018) 60 tablet 0    Musculoskeletal: Strength & Muscle Tone: within normal limits Gait & Station: UTA since patient is lying in bed. Patient leans: N/A  Psychiatric Specialty Exam: Physical Exam  Nursing note and vitals reviewed. Constitutional: She is oriented to person, place, and time. She appears well-developed and well-nourished.  HENT:  Head: Normocephalic and atraumatic.  Neck: Normal range of motion.  Respiratory: Effort normal.  Musculoskeletal: Normal range of motion.  Neurological: She is alert and oriented to person, place, and time.  Psychiatric: Judgment and thought content normal. She is slowed and withdrawn. Cognition and memory are normal. She exhibits a depressed mood.    Review of Systems  Psychiatric/Behavioral: Positive for depression and suicidal ideas. Negative for hallucinations.  All other systems reviewed and are negative.   Blood pressure (!) 152/67, pulse (!) 115, temperature 98.6 F (37 C), temperature source Oral, resp. rate 18, SpO2 96 %.There is no height or weight on file to calculate BMI.  General Appearance: Fairly Groomed,  elderly, Caucasian female, wearing paper hospital scrubs who is sitting in bed. NAD.   Eye Contact:  Good  Speech:  Clear and Coherent and Slow  Volume:  Normal  Mood:  Depressed  Affect:  Congruent  Thought Process:  Goal Directed, Linear and  Descriptions of Associations: Intact  Orientation:  Full (Time, Place, and Person)  Thought Content:  Logical  Suicidal Thoughts:  Yes.  with intent/plan  Homicidal Thoughts:  No  Memory:  Immediate;   Good Recent;   Good Remote;   Good  Judgement:  Fair  Insight:  Fair  Psychomotor Activity:  Normal  Concentration:  Concentration: Good and Attention Span: Good  Recall:  Good  Fund of Knowledge:  Good  Language:  Good  Akathisia:  No  Handed:  Right  AIMS (if indicated):   N/A  Assets:  Communication Skills Desire for Improvement Housing Resilience Social Support  ADL's:  Intact  Cognition:  WNL  Sleep:   Fair   Assessment:  [name] is a 73 y.o. female who was admitted with depression and SI. She reports declining mood with poor appetite and SI  since hospitalization at [facility]. She warrants inpatient psychiatric hospitalization given risk of harm to self and prior history of suicide attempts.    Treatment Plan Summary: Daily contact with patient to assess and evaluate symptoms and progress in treatment and Medication management  -Continue Lexapro 20 mg daily for depression and anxiety.  -Patient reports side effects with Risperdal. Will defer to inpatient psychiatric team with further medication management since accepted to [facility].   Disposition: Recommend psychiatric Inpatient admission when medically cleared.  Faythe Dingwall, DO 08/10/2018 10:58 AM

## 2018-08-10 NOTE — Progress Notes (Signed)
[name] is a 73 year old female pt admitted on voluntary basis. On admission, [name] is tearful, anxious and fidgety and speaks about "I'm not supposed to be here". When explained why she was here she did endorse that she went to her therapist and was contemplating suicide but reports that "I don't think I would have done it". She reports that after she was discharged from [facility] but reports that she did not take her medications as prescribed. She denies any substance use. She reports that she currently lives in an independent living community and is afraid she will be kicked out and not allowed to return. She denies SI on admission but does endorse that she was suicidal recently. She was escorted to the unit, oriented to the milieu and safety maintained.

## 2018-08-10 NOTE — ED Notes (Signed)
Report called to [facility] RN. 

## 2018-08-11 LAB — GLUCOSE, CAPILLARY: Glucose-Capillary: 88 mg/dL (ref 70–99)

## 2018-08-11 LAB — URINALYSIS, COMPLETE (UACMP) WITH MICROSCOPIC
Bilirubin Urine: NEGATIVE
Bilirubin Urine: NEGATIVE
Glucose, UA: NEGATIVE mg/dL
Glucose, UA: NEGATIVE mg/dL
Ketones, ur: 5 mg/dL — AB
Ketones, ur: NEGATIVE mg/dL
Nitrite: NEGATIVE
Nitrite: NEGATIVE
Protein, ur: NEGATIVE mg/dL
Protein, ur: NEGATIVE mg/dL
RBC / HPF: >50 RBC/hpf — ABNORMAL HIGH (ref 0–5)
RBC / HPF: >50 RBC/hpf — ABNORMAL HIGH (ref 0–5)
Specific Gravity, Urine: 1.01 (ref 1.005–1.030)
Specific Gravity, Urine: 1.011 (ref 1.005–1.030)
pH: 6 (ref 5.0–8.0)
pH: 6 (ref 5.0–8.0)

## 2018-08-11 LAB — LIPID PANEL
Cholesterol: 145 mg/dL (ref 0–200)
HDL: 88 mg/dL (ref >40)
LDL Cholesterol: 43 mg/dL (ref 0–99)
Total CHOL/HDL Ratio: 1.6 RATIO
Triglycerides: 70 mg/dL (ref <150)
VLDL: 14 mg/dL (ref 0–40)

## 2018-08-11 LAB — TSH: TSH: 0.68 u[IU]/mL (ref 0.350–4.500)

## 2018-08-11 LAB — HEMOGLOBIN A1C
Hgb A1c MFr Bld: 5.7 % — ABNORMAL HIGH (ref 4.8–5.6)
MEAN PLASMA GLUCOSE: 116.89 mg/dL

## 2018-08-11 LAB — LITHIUM LEVEL: Lithium Lvl: <0.06 mmol/L — ABNORMAL LOW (ref 0.60–1.20)

## 2018-08-11 MED ORDER — METOPROLOL TARTRATE 12.5 MG HALF TABLET
12.5000 mg | ORAL_TABLET | Freq: Two times a day (BID) | ORAL | Status: DC
  Administered 2018-08-11 – 2018-08-16 (×10): 12.5 mg via ORAL
  Filled 2018-08-11 (×14): qty 1

## 2018-08-11 NOTE — [facility] Counselor (Signed)
Adult Comprehensive Assessment  Patient ID: [name], female   DOB: [birth year], 73 y.o.   MRN: [id number]  Information Source: Information source: Patient  Current Stressors:  Patient states their primary concerns and needs for treatment are:: "I am really depressed and I said some things to my doctor I should not have said, that is why I am here"  Patient states their goals for this hospitilization and ongoing recovery are:: "Get on the right medications so I can feel better" Educational / Learning stressors: N/A  Employment / Job issues: Unemployed  Family Relationships: Patient denies any current Chartered loss adjuster / Lack of resources (include bankruptcy): SSI; Limited income  Housing / Lack of housing: Lives alone in an assisted living apartment -- [facility]  Physical health (include injuries & life threatening diseases): 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 Social relationships: Patient denies any current stressors  Substance abuse: Patient denies any current stressors  Bereavement / Loss: Patient denies any current stressors   Living/Environment/Situation:  Living Arrangements: Alone Living conditions (as described by patient or guardian): "Good" Who else lives in the home?: Alone  How long has patient lived in current situation?: Since September 2019  What is atmosphere in current home: Supportive  Family History:  Marital status: Widowed Widowed, when?: Husband passed away in 2005-10-08 Are you sexually active?: No What is your sexual orientation?: Heterosexual  Has your sexual activity been affected by drugs, alcohol, medication, or emotional stress?: No  Does patient have children?: No  Childhood History:  By whom was/is the patient raised?: Both parents Description of patient's relationship with caregiver when they were a child: Patient reports having a good relationship with her mother and father during her childhood.  Patient's description of current  relationship with people who raised him/her: Patient reports both of her parents are currently deceased  How were you disciplined when you got in trouble as a child/adolescent?: "My mom would just give [place] a look"  Does patient have siblings?: Yes Number of Siblings: 3 Description of patient's current relationship with siblings: Patient reports having a close relationship with her thee siblings. She states that two of her siblings are currently deceased  Did patient suffer any verbal/emotional/physical/sexual abuse as a child?: No Did patient suffer from severe childhood neglect?: No Has patient ever been sexually abused/assaulted/raped as an adolescent or adult?: No Was the patient ever a victim of a crime or a disaster?: No Witnessed domestic violence?: No Has patient been effected by domestic violence as an adult?: No  Education:  Highest grade of school patient has completed: 12th grade; Some college  Currently a student?: No Learning disability?: No  Employment/Work Situation:   Employment situation: Retired Archivist job has been impacted by current illness: No What is the longest time patient has a held a job?: 32 years  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  Did You Receive Any Psychiatric Treatment/Services While in Passenger transport manager?: No Are There Guns or Other Weapons in [place]?: No  Financial Resources:   Museum/gallery curator resources: Armed forces training and education officer, Medicare Does patient have a Programmer, applications or guardian?: No  Alcohol/Substance Abuse:   What has been your use of drugs/alcohol within the last 12 months?: Patient denies any substance use issues  Alcohol/Substance Abuse Treatment Hx: Denies past history Has alcohol/substance abuse ever caused legal problems?: No  Social Support System:   Pensions consultant Support System: Good Describe Community Support System: "Some family" Type of faith/religion: "I don't know" How  does patient's faith help to cope  with current illness?: N/A   Leisure/Recreation:   Leisure and Hobbies: "[employer] puzzles and [employer]"  Strengths/Needs:   What is the patient's perception of their strengths?: "I don't know" Patient states they can use these personal strengths during their treatment to contribute to their recovery: TBD Patient states these barriers may affect/interfere with their treatment: No Patient states these barriers may affect their return to the community: No Other important information patient would like considered in planning for their treatment: No   Discharge Plan:   Currently receiving community mental health services: Yes (From Whom) Patient states concerns and preferences for aftercare planning are: Patient reports she would like to continue to follow up with Theodoro Kos, PA for medication management services  Patient states they will know when they are safe and ready for discharge when: Yes  Does patient have access to transportation?: Yes Does patient have financial barriers related to discharge medications?: No Will patient be returning to same living situation after discharge?: Yes  Summary/Recommendations:   Summary and Recommendations (to be completed by the evaluator): [name] is a 73 year old female who is diagnosed with MDD recurrent without psychotic features, severe. She presented to the hospital seeking treatment for worsening depression and suicidal ideation. During the assessment, [name] was pleasant and cooperative with providing infromation. [name] states that she came to the hospital because she contemplated taking an overdose on her medications. [name] states that she would like to have her medications adjusted while in the hospital. [name] follows up with Theodoro Kos, PA for medication management services at [place]. [name] can benefit from crisis stabilization., medication management, therapeutic milieu and referral services.   [name].  08/11/2018

## 2018-08-11 NOTE — Progress Notes (Signed)
[facility] MD Progress Note  08/11/2018 11:25 AM [name]  MRN:  [id number] Subjective:  "I'm doing pretty good today."  [name] participating in group therapy on the unit. Reports improved mood today. Still depressed but improving. She believes Risperdal is helping with mood. Denies paranoid ideation on the unit. When asked about fears about people at assisted living, she states, "Today I don't think it's as bad as it was." She states she is unsure if people have actually been following her; states she still worries about this but "I guess maybe it was all in my head." Denies AVH. She started Risperdal last night and reports some nausea starting overnight that continues this morning. Denies vomiting or diarrhea and reports she still ate breakfast this morning. Denies hx of nausea while taking Risperdal in the hospital in November. States she feels the Risperdal is helping, and she wants to continue taking it. Patient c/o feeling dizzy when she stood up- bp rechecked 111/56 and hr 102. Morning metoprolol and HCTZ held. Denies SI.   Per admission H&P: Patient is a 73 year old female with a reported past psychiatric history significant for major depression with psychotic features who presented to the [facility] emergency department on 1/28 with depression and suicidal ideation.  The patient was recently seen by her primary psychiatric provider.  She had apparently told her therapist that she wanted take a handful of pills and end her life.  She stated she felt hopeless.  She endorsed symptoms of paranoia including being frightened by someone who lives down from her in her assisted care facility.  She also stated that she felt like there were listening devices in her apartment.  She felt as though her sister-in-law may have been somewhat involved with this.  She had a recent hospitalization secondary to an overdose in November 2019.  At that time she was hospitalized in the medical  hospital for approximately 11 days, then transferred to the [facility]- psychiatric unit.  She was hospitalized there from 05/2211/4 2019.  She was discharged on Risperdal 2 mg p.o. nightly and Lexapro 20 mg p.o. daily.  She is also on multiple medications for rheumatoid arthritis, hypertension, hyperlipidemia and hypertension.  Review of the electronic medical record revealed that she is been on multiple medication changes over the last 6 months.  She has been on Seroquel, Risperdal, Zyprexa, Abilify, Lexapro, Celexa, Paxil, Xanax, lithium and Depakote.  She stated that she is noncompliant with these medications because of her fear of them.  Notes in the chart were reviewed that showed that she had had problems psychiatrically since first grade where her sister once stated that she did not speak the first year of school.  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  She has not worked in approximately 10 years.  She is significantly anxious, and significantly paranoid.  She currently denied suicidal ideation at this time.  She was admitted to the hospital for evaluation and stabilization.  Principal Problem: <principal problem not specified> Diagnosis: Active Problems:   MDD (major depressive disorder), recurrent episode, severe ([place])  Total Time spent with patient: 20 minutes  Past Psychiatric History: See admission H&P  Past Medical History:  Past Medical History:  Diagnosis Date  . Anxiety   . Arthritis    rhematoid,osteoarthritis  . Diabetes mellitus without complication ([place])   . Elevated cholesterol   .  Hemorrhoids   . Hypertension   . Hypothyroidism   . Joint pain   . Nocturia   . Sleeping difficulties     Past Surgical History:  Procedure Laterality Date  . ABDOMINAL HYSTERECTOMY    . BACK SURGERY    . CERVICAL FUSION  2004  . CHOLECYSTECTOMY    . EYE  SURGERY Left    cataract  . PARS PLANA REPAIR OF RETINAL DEATACHMENT    . THYROIDECTOMY, PARTIAL     Family History:  Family History  Problem Relation Age of Onset  . Diabetes Mother   . Breast cancer Mother   . Gout Mother   . Alzheimer's disease Father   . Heart Problems Father   . Breast cancer Sister   . Alzheimer's disease Sister   . Diabetes Sister   . Gout Sister   . Heart Problems Sister   . Alzheimer's disease Brother   . Diabetes Brother   . Heart Problems Brother    Family Psychiatric  History: See admission H&P Social History:  Social History   Substance and Sexual Activity  Alcohol Use No     Social History   Substance and Sexual Activity  Drug Use No    Social History   Socioeconomic History  . Marital status: Widowed    Spouse name: Not on file  . Number of children: 0  . Years of education: HS  . Highest education level: Not on file  Occupational History  . Not on file  Social Needs  . Financial resource strain: Not on file  . Food insecurity:    Worry: Not on file    Inability: Not on file  . Transportation needs:    Medical: Not on file    Non-medical: Not on file  Tobacco Use  . Smoking status: Never Smoker  . Smokeless tobacco: Never Used  Substance and Sexual Activity  . Alcohol use: No  . Drug use: No  . Sexual activity: Not Currently  Lifestyle  . Physical activity:    Days per week: Not on file    Minutes per session: Not on file  . Stress: Not on file  Relationships  . Social connections:    Talks on phone: Not on file    Gets together: Not on file    Attends religious service: Not on file    Active member of club or organization: Not on file    Attends meetings of clubs or organizations: Not on file    Relationship status: Not on file  Other Topics Concern  . Not on file  Social History Narrative   Drinks 3 caffeine drinks a day    Additional Social History:                         Sleep:  Good  Appetite:  Fair  Current Medications: Current Facility-Administered Medications  Medication Dose Route Frequency Provider Last Rate Last Dose  . acetaminophen (TYLENOL) tablet 650 mg  650 mg Oral Q6H PRN Suella Broad, FNP   650 mg at 08/11/18 0557  . alum & mag hydroxide-simeth (MAALOX/MYLANTA) 200-200-20 MG/5ML suspension 30 mL  30 mL Oral Q4H PRN Burt Ek, Gayland Curry, FNP      . aspirin EC tablet 81 mg  81 mg Oral Daily Suella Broad, FNP   81 mg at 08/11/18 1029  . atorvastatin (LIPITOR) tablet 40 mg  40 mg Oral Daily Starkes-Perry, Gayland Curry, FNP  40 mg at 08/11/18 1030  . escitalopram (LEXAPRO) tablet 20 mg  20 mg Oral Daily Suella Broad, FNP   20 mg at 08/11/18 1030  . feeding supplement (ENSURE ENLIVE) (ENSURE ENLIVE) liquid 237 mL  237 mL Oral BID BM Sharma Covert, MD   237 mL at 08/10/18 1648  . folic acid (FOLVITE) tablet 1 mg  1 mg Oral Daily Suella Broad, FNP   1 mg at 08/11/18 1030  . hydrochlorothiazide (MICROZIDE) capsule 12.5 mg  12.5 mg Oral q morning - 10a Suella Broad, FNP   Stopped at 08/11/18 1031  . hydroxychloroquine (PLAQUENIL) tablet 400 mg  400 mg Oral Daily Suella Broad, FNP   400 mg at 08/11/18 1030  . leflunomide (ARAVA) tablet 20 mg  20 mg Oral Daily Suella Broad, FNP   20 mg at 08/11/18 1031  . loratadine (CLARITIN) tablet 10 mg  10 mg Oral Daily Suella Broad, FNP   10 mg at 08/11/18 1030  . magnesium hydroxide (MILK OF MAGNESIA) suspension 30 mL  30 mL Oral Daily PRN Suella Broad, FNP      . [START ON 08/14/2018] methotrexate (RHEUMATREX) tablet 15 mg  15 mg Oral Weekly Sharma Covert, MD      . metoprolol succinate (TOPROL-XL) 24 hr tablet 25 mg  25 mg Oral Daily Suella Broad, FNP   Stopped at 08/11/18 1031  . pneumococcal 23 valent vaccine (PNU-IMMUNE) injection 0.5 mL  0.5 mL Intramuscular Tomorrow-1000 Sharma Covert, MD      . predniSONE  (DELTASONE) tablet 5 mg  5 mg Oral Q breakfast Suella Broad, FNP   5 mg at 08/11/18 1030  . risperiDONE (RISPERDAL M-TABS) disintegrating tablet 2 mg  2 mg Oral QHS Sharma Covert, MD   2 mg at 08/10/18 2235    Lab Results:  Results for orders placed or performed during the hospital encounter of 08/10/18 (from the past 48 hour(s))  Urinalysis, Complete w Microscopic     Status: Abnormal   Collection Time: 08/10/18  2:30 PM  Result Value Ref Range   Color, Urine YELLOW YELLOW   APPearance HAZY (A) CLEAR   Specific Gravity, Urine 1.010 1.005 - 1.030   pH 6.0 5.0 - 8.0   Glucose, UA NEGATIVE NEGATIVE mg/dL   Hgb urine dipstick LARGE (A) NEGATIVE   Bilirubin Urine NEGATIVE NEGATIVE   Ketones, ur 5 (A) NEGATIVE mg/dL   Protein, ur NEGATIVE NEGATIVE mg/dL   Nitrite NEGATIVE NEGATIVE   Leukocytes, UA LARGE (A) NEGATIVE   RBC / HPF >50 (H) 0 - 5 RBC/hpf   WBC, UA 11-20 0 - 5 WBC/hpf   Bacteria, UA RARE (A) NONE SEEN   Squamous Epithelial / LPF 0-5 0 - 5   Mucus PRESENT     Comment: Performed at [facility], [place] [address] [place], [place] [address]  Lithium level     Status: Abnormal   Collection Time: 08/10/18  6:36 PM  Result Value Ref Range   Lithium Lvl <0.06 (L) 0.60 - 1.20 mmol/L    Comment: Performed at [facility], [place] [address] [place], [place] [address]  TSH     Status: Abnormal   Collection Time: 08/10/18  6:36 PM  Result Value Ref Range   TSH 0.297 (L) 0.350 - 4.500 uIU/mL    Comment: Performed by a 3rd Generation assay with a functional sensitivity of <=0.01 uIU/mL. Performed at  [facility], [place] [address] [place], [place] [address]   Glucose, capillary     Status: None   Collection Time: 08/11/18  5:51 AM  Result Value Ref Range   Glucose-Capillary 88 70 - 99 mg/dL  Hemoglobin A1c     Status: Abnormal   Collection Time: 08/11/18  6:43 AM  Result Value Ref Range   Hgb A1c MFr Bld 5.7 (H)  4.8 - 5.6 %    Comment: (NOTE) Pre diabetes:          5.7%-6.4% Diabetes:              >6.4% Glycemic control for   <7.0% adults with diabetes    Mean Plasma Glucose 116.89 mg/dL    Comment: Performed at [facility] [address] [place], [place] [address]  Lipid panel     Status: None   Collection Time: 08/11/18  6:43 AM  Result Value Ref Range   Cholesterol 145 0 - 200 mg/dL   Triglycerides 70 <150 mg/dL   HDL 88 >40 mg/dL   Total CHOL/HDL Ratio 1.6 RATIO   VLDL 14 0 - 40 mg/dL   LDL Cholesterol 43 0 - 99 mg/dL    Comment:        Total Cholesterol/HDL:CHD Risk Coronary Heart Disease Risk Table                     Men   Women  1/2 Average Risk   3.4   3.3  Average Risk       5.0   4.4  2 X Average Risk   9.6   7.1  3 X Average Risk  23.4   11.0        Use the calculated Patient Ratio above and the CHD Risk Table to determine the patient's CHD Risk.        ATP III CLASSIFICATION (LDL):  <100     mg/dL   Optimal  100-129  mg/dL   Near or Above                    Optimal  130-159  mg/dL   Borderline  160-189  mg/dL   High  >190     mg/dL   Very High Performed at [place] [facility] [address], [place], [place] [address]   Lithium level     Status: Abnormal   Collection Time: 08/11/18  6:43 AM  Result Value Ref Range   Lithium Lvl <0.06 (L) 0.60 - 1.20 mmol/L    Comment: REPEATED TO VERIFY Performed at [facility], [place] [address], [place], [place] [address]   TSH     Status: None   Collection Time: 08/11/18  6:43 AM  Result Value Ref Range   TSH 0.680 0.350 - 4.500 uIU/mL    Comment: Performed by a 3rd Generation assay with a functional sensitivity of <=0.01 uIU/mL. Performed at [facility], [place] [address] [place], [place] [address]     Blood Alcohol level:  Lab Results  Component Value Date   [facility] <10 08/09/2018   ETH <10 73/71/0626    Metabolic Disorder Labs: Lab Results  Component Value  Date   HGBA1C 5.7 (H) 08/11/2018   MPG 116.89 08/11/2018   MPG 105.41 05/25/2018   No results found for: PROLACTIN Lab Results  Component Value Date   CHOL 145 08/11/2018   TRIG 70 08/11/2018   HDL  88 08/11/2018   CHOLHDL 1.6 08/11/2018   VLDL 14 08/11/2018   LDLCALC 43 08/11/2018    Physical Findings: AIMS: Facial and Oral Movements Muscles of Facial Expression: None, normal Lips and Perioral Area: None, normal Jaw: None, normal Tongue: None, normal,Extremity Movements Upper (arms, wrists, hands, fingers): None, normal Lower (legs, knees, ankles, toes): None, normal, Trunk Movements Neck, shoulders, hips: None, normal, Overall Severity Severity of abnormal movements (highest score from questions above): None, normal Incapacitation due to abnormal movements: None, normal Patient's awareness of abnormal movements (rate only patient's report): No Awareness, Dental Status Current problems with teeth and/or dentures?: No Does patient usually wear dentures?: Yes  CIWA:    COWS:     Musculoskeletal: Strength & Muscle Tone: within normal limits Gait & Station: normal Patient leans: N/A  Psychiatric Specialty Exam: Physical Exam  Nursing note and vitals reviewed. Constitutional: She is oriented to person, place, and time. She appears well-developed.  Respiratory: Effort normal.  Neurological: She is alert and oriented to person, place, and time.    Review of Systems  Constitutional: Negative.   Respiratory: Negative.   Cardiovascular: Negative.   Gastrointestinal: Positive for nausea.  Neurological: Positive for dizziness.  Psychiatric/Behavioral: Positive for depression (improving). Negative for hallucinations, memory loss, substance abuse and suicidal ideas. The patient is nervous/anxious. The patient does not have insomnia.     Blood pressure (!) 111/56, pulse (!) 102, temperature 99.2 F (37.3 C), temperature source Oral, resp. rate 18, height 5\' 3"  (1.6 m), weight  60.3 kg.Body mass index is 23.56 kg/m.  General Appearance: Fairly Groomed  Eye Contact:  Fair  Speech:  Slow  Volume:  Decreased  Mood:  Euthymic  Affect:  Congruent  Thought Process:  Coherent  Orientation:  Full (Time, Place, and Person)  Thought Content:  Delusions and Paranoid Ideation  Suicidal Thoughts:  No  Homicidal Thoughts:  No  Memory:  Immediate;   Fair Recent;   Fair  Judgement:  Fair  Insight:  Fair  Psychomotor Activity:  Normal  Concentration:  Concentration: Fair  Recall:  [employer] of Knowledge:  Fair  Language:  Good  Akathisia:  No  Handed:  Right  AIMS (if indicated):     Assets:  Desire for Improvement Housing Social Support  ADL's:  Intact  Cognition:  WNL  Sleep:  Number of Hours: 4.5     Treatment Plan Summary: Daily contact with patient to assess and evaluate symptoms and progress in treatment and Medication management   Continue inpatient hospitalization.  Continue Lexapro 20 mg PO daily for mood Continue Risperdal 2 mg PO QHS for psychosis Discontinue HCTZ due to low blood pressures Metoprolol adjusted to 12.5 mg PO BID due to low blood pressures  Other medical: Continue ASA 81 mg PO daily Continue Lipitor 40 mg PO daily Continue methotrexate 15 mg PO weekly Continue folic acid 1 mg PO daily Continue Plaquenil 400 mg PO daily Continue Arava 20 mg PO daily Continue Claritin 10 mg PO daily Continue prednisone 5 mg PO daily  Patient will participate in the therapeutic group milieu.  Discharge disposition in progress.   Connye Burkitt, NP 08/11/2018, 11:25 AM

## 2018-08-11 NOTE — Plan of Care (Signed)
  Problem: Activity: Goal: Interest or engagement in activities will improve Outcome: Progressing   D: Pt alert and oriented on the unit. Pt engaging with RN staff and other pts and denies SI/HI, A/VH. Pt's affect was flat and mood depressed. Pt  participated during unit groups and activities today, and she is pleasant and cooperative. A: Education, support and encouragement provided, q15 minute safety checks remain in effect. Medications administered per MD orders. R: No reactions/side effects to medicine noted. Pt denies any concerns at this time, and verbally contracts for safety. Pt ambulating on the unit with no issues. Pt remains safe on and off the unit.

## 2018-08-11 NOTE — Tx Team (Signed)
Interdisciplinary Treatment and Diagnostic Plan Update  08/11/2018 Time of Session: 9:40am [name] MRN: [id number]  Principal Diagnosis: <principal problem not specified>  Secondary Diagnoses: Active Problems:   MDD (major depressive disorder), recurrent episode, severe (HCC)   Current Medications:  Current Facility-Administered Medications  Medication Dose Route Frequency Provider Last Rate Last Dose  . acetaminophen (TYLENOL) tablet 650 mg  650 mg Oral Q6H PRN Suella Broad, FNP   650 mg at 08/11/18 0557  . alum & mag hydroxide-simeth (MAALOX/MYLANTA) 200-200-20 MG/5ML suspension 30 mL  30 mL Oral Q4H PRN Burt Ek, Gayland Curry, FNP      . aspirin EC tablet 81 mg  81 mg Oral Daily Suella Broad, FNP   81 mg at 08/11/18 1029  . atorvastatin (LIPITOR) tablet 40 mg  40 mg Oral Daily Suella Broad, FNP   40 mg at 08/11/18 1030  . escitalopram (LEXAPRO) tablet 20 mg  20 mg Oral Daily Suella Broad, FNP   20 mg at 08/11/18 1030  . feeding supplement (ENSURE ENLIVE) (ENSURE ENLIVE) liquid 237 mL  237 mL Oral BID BM Sharma Covert, MD   237 mL at 08/10/18 1648  . folic acid (FOLVITE) tablet 1 mg  1 mg Oral Daily Suella Broad, FNP   1 mg at 08/11/18 1030  . hydroxychloroquine (PLAQUENIL) tablet 400 mg  400 mg Oral Daily Suella Broad, FNP   400 mg at 08/11/18 1030  . leflunomide (ARAVA) tablet 20 mg  20 mg Oral Daily Suella Broad, FNP   20 mg at 08/11/18 1031  . loratadine (CLARITIN) tablet 10 mg  10 mg Oral Daily Suella Broad, FNP   10 mg at 08/11/18 1030  . magnesium hydroxide (MILK OF MAGNESIA) suspension 30 mL  30 mL Oral Daily PRN Suella Broad, FNP      . [START ON 08/14/2018] methotrexate (RHEUMATREX) tablet 15 mg  15 mg Oral Weekly Sharma Covert, MD      . metoprolol tartrate (LOPRESSOR) tablet 12.5 mg  12.5 mg Oral BID Sharma Covert, MD      . pneumococcal 23 valent vaccine  (PNU-IMMUNE) injection 0.5 mL  0.5 mL Intramuscular Tomorrow-1000 Sharma Covert, MD      . predniSONE (DELTASONE) tablet 5 mg  5 mg Oral Q breakfast Suella Broad, FNP   5 mg at 08/11/18 1030  . risperiDONE (RISPERDAL M-TABS) disintegrating tablet 2 mg  2 mg Oral QHS Sharma Covert, MD   2 mg at 08/10/18 2235   PTA Medications: Medications Prior to Admission  Medication Sig Dispense Refill Last Dose  . ACCU-CHEK GUIDE test strip CHECK BLOOD SUGAR ONCE A DAY  3 Taking  . folic acid (FOLVITE) 1 MG tablet Take 1 mg by mouth daily.    08/08/2018 at Unknown time  . hydrochlorothiazide (HYDRODIURIL) 12.5 MG tablet Take 12.5 mg by mouth every morning.   08/08/2018 at Unknown time  . hydroxychloroquine (PLAQUENIL) 200 MG tablet Take 400 mg by mouth daily.   08/08/2018 at Unknown time  . leflunomide (ARAVA) 20 MG tablet Take 20 mg by mouth daily.   08/08/2018 at Unknown time  . lisinopril (PRINIVIL,ZESTRIL) 10 MG tablet Take 10 mg by mouth daily.  1 08/08/2018 at Unknown time  . lithium carbonate 150 MG capsule Take 1 capsule (150 mg total) by mouth daily. (Patient not taking: Reported on 08/09/2018) 30 capsule 0 Not Taking at Unknown time  . OLANZapine zydis (  ZYPREXA) 5 MG disintegrating tablet Take 0.5 tablets (2.5 mg total) by mouth at bedtime. (Patient not taking: Reported on 08/09/2018) 30 tablet 2 Not Taking at Unknown time  . PARoxetine (PAXIL) 40 MG tablet 1/2 tab for a week, then stop (Patient not taking: Reported on 08/09/2018) 7 tablet 0 Not Taking at Unknown time  . predniSONE (DELTASONE) 5 MG tablet Take 5 mg by mouth daily with breakfast.   08/08/2018 at Unknown time  . risperiDONE (RISPERDAL) 1 MG tablet 1 pill a day for 3 days, then 2 a day (Patient not taking: Reported on 08/09/2018) 60 tablet 0 Not Taking at Unknown time    Patient Stressors: Health problems Medication change or noncompliance  Patient Strengths: Ability for insight Average or above average  intelligence Capable of independent living General fund of knowledge  Treatment Modalities: Medication Management, Group therapy, Case management,  1 to 1 session with clinician, Psychoeducation, Recreational therapy.   Physician Treatment Plan for Primary Diagnosis: <principal problem not specified> Long Term Goal(s): Improvement in symptoms so as ready for discharge Improvement in symptoms so as ready for discharge   Short Term Goals: Ability to identify changes in lifestyle to reduce recurrence of condition will improve Ability to verbalize feelings will improve Ability to disclose and discuss suicidal ideas Ability to demonstrate self-control will improve Ability to identify and develop effective coping behaviors will improve Ability to maintain clinical measurements within normal limits will improve Compliance with prescribed medications will improve Ability to identify changes in lifestyle to reduce recurrence of condition will improve Ability to verbalize feelings will improve Ability to disclose and discuss suicidal ideas Ability to demonstrate self-control will improve Ability to identify and develop effective coping behaviors will improve Ability to maintain clinical measurements within normal limits will improve Compliance with prescribed medications will improve  Medication Management: Evaluate patient's response, side effects, and tolerance of medication regimen.  Therapeutic Interventions: 1 to 1 sessions, Unit Group sessions and Medication administration.  Evaluation of Outcomes: Not Met  Physician Treatment Plan for Secondary Diagnosis: Active Problems:   MDD (major depressive disorder), recurrent episode, severe ([name])  Long Term Goal(s): Improvement in symptoms so as ready for discharge Improvement in symptoms so as ready for discharge   Short Term Goals: Ability to identify changes in lifestyle to reduce recurrence of condition will improve Ability to  verbalize feelings will improve Ability to disclose and discuss suicidal ideas Ability to demonstrate self-control will improve Ability to identify and develop effective coping behaviors will improve Ability to maintain clinical measurements within normal limits will improve Compliance with prescribed medications will improve Ability to identify changes in lifestyle to reduce recurrence of condition will improve Ability to verbalize feelings will improve Ability to disclose and discuss suicidal ideas Ability to demonstrate self-control will improve Ability to identify and develop effective coping behaviors will improve Ability to maintain clinical measurements within normal limits will improve Compliance with prescribed medications will improve     Medication Management: Evaluate patient's response, side effects, and tolerance of medication regimen.  Therapeutic Interventions: 1 to 1 sessions, Unit Group sessions and Medication administration.  Evaluation of Outcomes: Not Met   RN Treatment Plan for Primary Diagnosis: <principal problem not specified> Long Term Goal(s): Knowledge of disease and therapeutic regimen to maintain health will improve  Short Term Goals: Ability to participate in decision making will improve, Ability to verbalize feelings will improve, Ability to disclose and discuss suicidal ideas, Ability to identify and develop effective coping behaviors will  improve and Compliance with prescribed medications will improve  Medication Management: RN will administer medications as ordered by provider, will assess and evaluate patient's response and provide education to patient for prescribed medication. RN will report any adverse and/or side effects to prescribing provider.  Therapeutic Interventions: 1 on 1 counseling sessions, Psychoeducation, Medication administration, Evaluate responses to treatment, Monitor vital signs and CBGs as ordered, Perform/monitor CIWA, COWS, AIMS  and Fall Risk screenings as ordered, Perform wound care treatments as ordered.  Evaluation of Outcomes: Not Met   LCSW Treatment Plan for Primary Diagnosis: <principal problem not specified> Long Term Goal(s): Safe transition to appropriate next level of care at discharge, Engage patient in therapeutic group addressing interpersonal concerns.  Short Term Goals: Engage patient in aftercare planning with referrals and resources  Therapeutic Interventions: Assess for all discharge needs, 1 to 1 time with Social worker, Explore available resources and support systems, Assess for adequacy in community support network, Educate family and significant other(s) on suicide prevention, Complete Psychosocial Assessment, Interpersonal group therapy.  Evaluation of Outcomes: Not Met   Progress in Treatment: Attending groups: Yes. Participating in groups: Yes. Taking medication as prescribed: Yes. Toleration medication: Yes. Family/Significant other contact made: No, will contact:  the patient's sister Patient understands diagnosis: Yes. Discussing patient identified problems/goals with staff: Yes. Medical problems stabilized or resolved: Yes. Denies suicidal/homicidal ideation: Yes. Issues/concerns per patient self-inventory: No. Other:   New problem(s) identified: None  New Short Term/Long Term Goal(s): medication stabilization, elimination of SI thoughts, development of comprehensive mental wellness plan.   Patient Goals:  "I want to discharge by Friday"  Discharge Plan or Barriers: Patient plans to discharge home alone, and plans to continue to follow up with her medication management provider, Theodoro Kos, PA at [facility]. Patient states that she is not interested in therapy services at discharge at this time. CSW will continue to follow and assess.   Reason for Continuation of Hospitalization: Depression Medication stabilization Suicidal ideation  Estimated Length  of Stay: 08/13/2018  Attendees: Patient: 08/11/2018 1:55 PM  Physician: Dr. Myles Lipps, MD 08/11/2018 1:55 PM  Nursing: Elberta Fortis.A, RN  08/11/2018 1:55 PM  RN Care Manager: 08/11/2018 1:55 PM  Social Worker: Radonna Ricker, [place] 08/11/2018 1:55 PM  Recreational Therapist:  08/11/2018 1:55 PM  Other: Harriett Sine, NP  08/11/2018 1:55 PM  Other:  08/11/2018 1:55 PM  Other: 08/11/2018 1:55 PM    Scribe for Treatment Team: Marylee Floras, [place] 08/11/2018 1:55 PM

## 2018-08-11 NOTE — Progress Notes (Signed)
NUTRITION ASSESSMENT  Pt identified as at risk on the Malnutrition Screen Tool  INTERVENTION: - Continue Ensure Enlive BID, each supplement provides 350 kcal and 20 grams of protein. - Continue to encourage PO intakes.   NUTRITION DIAGNOSIS: Unintentional weight loss related to sub-optimal intake as evidenced by pt report.   Goal: Pt to meet >/= 90% of their estimated nutrition needs.  Monitor:  PO intake  Assessment:  Patient admitted for depression and SI. Per notes, patient had recently reported a plan to her psych provider to OD on pills. She had also reported symptoms of paranoia.   Per chart review, current weight is 133 lb and weight on 05/24/18 was 155 lb. This indicates 22 lb weight loss (14% body weight) in the past 2.5 months; significant for time frame.  Ensure Enlive was ordered per ONS protocol at the time of admission and patient accepted a bottle offered to her yesterday evening.     73 y.o. female  Height: Ht Readings from Last 1 Encounters:  08/10/18 5\' 3"  (1.6 m)    Weight: Wt Readings from Last 1 Encounters:  08/10/18 60.3 kg    Weight Hx: Wt Readings from Last 10 Encounters:  08/10/18 60.3 kg  05/24/18 70.3 kg  10/07/16 74.8 kg  05/13/16 73.5 kg  03/07/14 78.7 kg  02/13/14 77.6 kg  02/07/14 77.8 kg  01/10/14 78.5 kg    BMI:  Body mass index is 23.56 kg/m. Pt meets criteria for normal weight based on current BMI.  Estimated Nutritional Needs: Kcal: 25-30 kcal/kg Protein: > 1 gram protein/kg Fluid: 1 ml/kcal  Diet Order:  Diet Order            Diet regular Room service appropriate? Yes; Fluid consistency: Thin  Diet effective now             Pt is also offered choice of unit snacks mid-morning and mid-afternoon.  Pt is eating as desired.   Lab results and medications reviewed.     Jarome Matin, MS, RD, LDN, [facility] Inpatient Clinical Dietitian Pager # [phone] After hours/weekend pager # [phone]

## 2018-08-11 NOTE — Therapy (Signed)
Occupational Therapy Group Note  Date:  08/11/2018 Time:  11:23 AM  Group Topic/Focus:  Self Esteem Action Plan:   The focus of this group is to help patients create a plan to continue to build self-esteem after discharge.  Participation Level:  Active  Participation Quality:  Appropriate  Affect:  Flat  Cognitive:  Appropriate  Insight: Improving  Engagement in Group:  Engaged  Modes of Intervention:  Activity, Discussion, Education and Socialization  Additional Comments:    S: "Healthy relationships can increase self esteem"  O: OT tx with focus on self esteem building this date. Education given on definition of self esteem, with both causes of low and high self esteem identified. Activity given for pt to identify a positive/aspiring trait for each letter of the alphabet. Pt to work with peers to help complete activity and build positive thinking.   A: Pt presents to group with flat affect, engaged and participatory throughout session. Pt helped add to self esteem education by sharing with group appropriately. She shares that people putting you down and negative thinking patterns decrease her self esteem, while healthy relationships increase her self esteem. Pt began to engage in A-Z activity, but was then pulled by another staff member and not returning before end of group.  P: Education given on self esteem. OT group will continue while pt inpatient.   Zenovia Jarred, MSOT, OTR/L Behavioral Health OT/ Acute Relief OT PHP Office: [place] 08/11/2018, 11:23 AM

## 2018-08-11 NOTE — Progress Notes (Signed)
D: Pt was in the dayroom upon initial approach.  She smiles with interaction.  Pt reports she had "nausea all day.  I feel better now."  Her goal is: "I would like to get the right medications to where I feel a lot better."  Pt presents with depressed affect and mood.  Pt denies SI/HI, denies hallucinations, reports R leg pain of 5/10 with increased pain with activity.  Pt has been visible in milieu interacting with peers and staff appropriately.  Pt attended evening group.    A: Introduced self to pt.  Met with pt 1:1.  Actively listened to pt and offered support and encouragement.  Medication administered per order.  Snack administered with medication.  PRN medication administered for pain.  Fall prevention techniques reviewed with pt and she verbalized understanding.  Q15 minute safety checks maintained.  R: Pt is safe on the unit.  Pt is compliant with medications.  Pt verbally contracts for safety.  Will continue to monitor and assess.   

## 2018-08-11 NOTE — Progress Notes (Signed)
Patient ID: [name], female   DOB: [birth year], 73 y.o.   MRN: [id number]   D: Patient lying in bed on approach tonight. Reports coming here earlier today after being at the regular hospital. She reports no mood improvement but denies any SI at present. Give urine cup for ordered urine test. A: Staff will continue to monitor on q 15 minutes, follow treatment plan, and give medications as needed. R: Took medication and went to bed.

## 2018-08-12 DIAGNOSIS — R413 Other amnesia: Secondary | ICD-10-CM

## 2018-08-12 LAB — GLUCOSE, CAPILLARY: Glucose-Capillary: 78 mg/dL (ref 70–99)

## 2018-08-12 MED ORDER — RISPERIDONE 0.5 MG PO TBDP
0.5000 mg | ORAL_TABLET | Freq: Every day | ORAL | Status: DC
  Administered 2018-08-12 – 2018-08-16 (×5): 0.5 mg via ORAL
  Filled 2018-08-12 (×9): qty 1

## 2018-08-12 NOTE — Progress Notes (Signed)
D: Pt was in dayroom upon initial approach.  Pt presents with depressed affect and mood.  She reports her day was "not as great as I would've liked.  I thought I'd go home."  Her goal is to "see how I can make improvements."  Pt denies SI/HI, denies hallucinations, reports bilateral leg pain of 8/10.  Pt has been visible in milieu interacting with peers and staff appropriately.  Pt attended evening group.    A: Introduced self to pt.  Met with pt 1:1.  Actively listened to pt and offered support and encouragement.  Medication administered per order.  PRN medication administered for pain.  Q15 minute safety checks maintained.  R: Pt is safe on the unit.  Pt is compliant with medications.  Pt verbally contracts for safety.  Will continue to monitor and assess.

## 2018-08-12 NOTE — Plan of Care (Signed)
  Problem: Education: Goal: Emotional status will improve Outcome: Not Progressing Goal: Mental status will improve Outcome: Not Progressing   Problem: Activity: Goal: Sleeping patterns will improve Outcome: Not Progressing   D: Patient reports poor sleep.  She is also complaining of general weakness.  She states, "I can't stand for very long, and I have to take my medications one at a time."  She rates her depression and hopelessness as an 8; anxiety as a 9.  She states she is experiencing some nausea and diarrhea.  She is observed sitting in the day room with her peers.  She is slow to respond to questions and appears disorganized.  Patient remains a high fall risk due to some unsteadiness on her feet.  She denies any AVH or thoughts of self harm.  A: Continue to monitor medication management and MD orders.  Safety checks completed every 15 minutes per protocol.  Offer support and encouragement as needed.  R: Patient is receptive to staff; his/her behavior is appropriate.

## 2018-08-12 NOTE — [facility] Suicide Risk Assessment (Signed)
[facility] INPATIENT:  Family/Significant Other Suicide Prevention Education  Suicide Prevention Education:  Education Completed; [name], sister [phone]) has been identified by the patient as the family member/significant other with whom the patient will be residing, and identified as the person(s) who will aid the patient in the event of a mental health crisis (suicidal ideations/suicide attempt).  With written consent from the patient, the family member/significant other has been provided the following suicide prevention education, prior to the and/or following the discharge of the patient.  The suicide prevention education provided includes the following:  Suicide risk factors  Suicide prevention and interventions  National Suicide Hotline telephone number  [facility] assessment telephone number  [facility] Emergency Assistance Julia and/or Residential Mobile Crisis Unit telephone number  Request made of family/significant other to:  Remove weapons (e.g., guns, rifles, knives), all items previously/currently identified as safety concern.    Remove drugs/medications (over-the-counter, prescriptions, illicit drugs), all items previously/currently identified as a safety concern.  The family member/significant other verbalizes understanding of the suicide prevention education information provided.  The family member/significant other agrees to remove the items of safety concern listed above.  Patient's sister questioned whether or not the patient had been screened for an UTI. She states that the patient reported that she "may have one" and the sister beleives this is the cause of the patient's intermittent confusion and "loopiness" . CSW ensured the patient's sister that this information will be passed on to the MD. The patient's sister did not have any further questions or concerns at this time. CSW informed the patient's sister, that CSW was  currently referring the patient for a possible CST provider nor any additional support at discharge. CSW also reached out to the patient's ALF community to explore possible assistance from their staff. CSW will continue to follow.    [name] 08/12/2018, 2:48 PM

## 2018-08-12 NOTE — Progress Notes (Signed)
Adult Psychoeducational Group Note  Date:  08/12/2018 Time:  10:14 PM  Group Topic/Focus:  Wrap-Up Group:   The focus of this group is to help patients review their daily goal of treatment and discuss progress on daily workbooks.  Participation Level:  Minimal  Participation Quality:  Appropriate  Affect:  Flat  Cognitive:  Alert and Oriented  Insight: Improving  Engagement in Group:  Developing/Improving  Modes of Intervention:  Clarification, Exploration and Support  Additional Comments:  Pt rated her day a 3. Pt verbalized that something positive was that people helped her out. Pt verbalized something she enjoys doing is crossword puzzles and watching television programs(flipping houses).  Jeyli Zwicker, [place] 08/12/2018, 10:14 PM

## 2018-08-12 NOTE — Evaluation (Addendum)
Occupational Therapy Evaluation Patient Details Name: [name] MRN: [id number] DOB: [birth year] Today's Date: 08/12/2018    History of Present Illness [name] is a 73 y.o. female with medical history significant of hypothyroidism; HTN; HLD; and DM presenting to [facility] with depression and suicidal ideation in addition to paranoia in her ILF facility.   Clinical Impression   Pt admitted with above diagnoses, with dynamic balance deficits affecting ability to engage in BADL at highest level of safety. Pt with limited awareness into deficits and minimally compliant with OT suggestions. Noted some difficult with cognition in regard to awareness of safety and deficits. BADL being completed at supervision level of A for balance. Completed BERG balance screen with pt score of 35 which leaves her at a medium level fall risk, which impairs pt ability to safely engage in IADL and higher level BADL. Pt also c/o of pain and needing to sit after long periods on her feet. Recommended rollator walker for pt to use when completing functional mobility, pt states "I cannot make decisions and I don't think I want that". Educated that rollator would help increase safety with the ability to sit as well, pt still not agreeable. Per chart review, pt has been recommended walker usage in the past and not compliant with self limiting behavior. No OT post d/c is indicated at this time. Will continue to follow pt while acute in the event changes can be made for DME or balance.    Follow Up Recommendations  No OT follow up    Equipment Recommendations  Other (comment)(rollator if pt willing to agree)    Recommendations for Other Services       Precautions / Restrictions Precautions Precautions: Fall Restrictions Weight Bearing Restrictions: No      Mobility Bed Mobility Overal bed mobility: Modified Independent                Transfers Overall transfer level: Needs assistance Equipment  used: None Transfers: Sit to/from Stand Sit to Stand: Supervision         General transfer comment: supervision for safety    Balance Overall balance assessment: Needs assistance Sitting-balance support: No upper extremity supported Sitting balance-Leahy Scale: Good     Standing balance support: No upper extremity supported;During functional activity Standing balance-Leahy Scale: Poor                   Standardized Balance Assessment Standardized Balance Assessment : Berg Balance Test Berg Balance Test Sit to Stand: Able to stand without using hands and stabilize independently Standing Unsupported: Able to stand 2 minutes with supervision(mentions pain in ankle, begins shifting weight constantly) Sitting with Back Unsupported but Feet Supported on Floor or Stool: Able to sit safely and securely 2 minutes Stand to Sit: Controls descent by using hands Transfers: Able to transfer safely, definite need of hands Standing Unsupported with Eyes Closed: Able to stand 10 seconds with supervision Standing Ubsupported with Feet Together: Able to place feet together independently but unable to hold for 30 seconds From Standing, Reach Forward with Outstretched Arm: Can reach forward >12 cm safely (5") From Standing Position, Pick up Object from Floor: Able to pick up shoe, needs supervision From Standing Position, Turn to Look Behind Over each Shoulder: Turn sideways only but maintains balance Turn 360 Degrees: Able to turn 360 degrees safely one side only in 4 seconds or less Standing Unsupported, Alternately Place Feet on Step/Stool: Able to complete 4 steps without aid or supervision Standing  Unsupported, One Foot in Front: Loses balance while stepping or standing Standing on One Leg: Unable to try or needs assist to prevent fall Total Score: 35       ADL either performed or assessed with clinical judgement   ADL Overall ADL's : Needs assistance/impaired Eating/Feeding:  Independent;Sitting   Grooming: Supervision/safety;Standing   Upper Body Bathing: Supervision/ safety;Sitting   Lower Body Bathing: Supervison/ safety;Sitting/lateral leans;Sit to/from stand   Upper Body Dressing : Supervision/safety;Sitting   Lower Body Dressing: Supervision/safety;Sitting/lateral leans   Toilet Transfer: Supervision/safety;Regular Toilet;Grab bars   Toileting- Clothing Manipulation and Hygiene: Modified independent   Tub/ Shower Transfer: Supervision/safety;Shower seat;Grab bars   Functional mobility during ADLs: Supervision/safety General ADL Comments: pt is at supervision level of A for BADL, but presents with dynamic balance challenges that impact successful ADL performance     Vision Patient Visual Report: No change from baseline       Perception     Praxis      Pertinent Vitals/Pain Pain Assessment: Faces Faces Pain Scale: Hurts little more Pain Location: leg, radiating up to back Pain Descriptors / Indicators: Aching;Discomfort Pain Intervention(s): Limited activity within patient's tolerance     Hand Dominance     Extremity/Trunk Assessment Upper Extremity Assessment Upper Extremity Assessment: Generalized weakness   Lower Extremity Assessment Lower Extremity Assessment: Generalized weakness   Cervical / Trunk Assessment Cervical / Trunk Assessment: Normal   Communication Communication Communication: No difficulties   Cognition Arousal/Alertness: Awake/alert Behavior During Therapy: WFL for tasks assessed/performed Overall Cognitive Status: Impaired/Different from baseline Area of Impairment: Safety/judgement;Memory;Following commands                     Memory: Decreased short-term memory Following Commands: Follows multi-step commands with increased time Safety/Judgement: Decreased awareness of safety;Decreased awareness of deficits     General Comments: pt at times a poor historian, but overall apporpriate in  conversation despite psychiatric symptoms of paranoia, depression, and anxiety   General Comments       Exercises     Shoulder Instructions      Home Living Family/patient expects to be discharged to:: Assisted living Living Arrangements: Alone                           Home Equipment: None   Additional Comments: pt lives in [place] facility, she shares family checks on her occaisonally      Prior Functioning/Environment Level of Independence: Independent        Comments: reports she does not cook much, and eats out often        OT Problem List: Decreased strength;Decreased activity tolerance;Decreased safety awareness;Decreased knowledge of use of DME or AE      OT Treatment/Interventions: Self-care/ADL training;Balance training;DME and/or AE instruction;Therapeutic exercise;Therapeutic activities;Patient/family education    OT Goals(Current goals can be found in the care plan section) Acute Rehab OT Goals Patient Stated Goal: to go home OT Goal Formulation: With patient Time For Goal Achievement: 08/26/18 Potential to Achieve Goals: Good ADL Goals Additional ADL Goal #1: Pt will demonstrate improved dynamic standing balance during BADL by engaging in HEP x2 per week Additional ADL Goal #2: Pt will navigate BADL environment at mod I level with rollator  OT Frequency: Min 1X/week   Barriers to D/C:            Co-evaluation              AM-PAC OT "6 Clicks"  Daily Activity     Outcome Measure Help from another person eating meals?: None Help from another person taking care of personal grooming?: None Help from another person toileting, which includes using toliet, bedpan, or urinal?: None Help from another person bathing (including washing, rinsing, drying)?: A Little Help from another person to put on and taking off regular upper body clothing?: None Help from another person to put on and taking off regular lower body clothing?: A Little 6 Click  Score: 22   End of Session Equipment Utilized During Treatment: Gait belt Nurse Communication: Mobility status  Activity Tolerance: Patient tolerated treatment well Patient left: Other (comment)(in dayroom on unit)  OT Visit Diagnosis: Other abnormalities of gait and mobility (R26.89);Muscle weakness (generalized) (M62.81)                Time: 5449-2010 OT Time Calculation (min): 16 min Charges:  OT General Charges $OT Visit: 1 Visit OT Evaluation $OT Eval Moderate Complexity: [place], MSOT, OTR/L [employer] OT/ Acute Relief OT PHP Office: [phone]  Zenovia Jarred 08/12/2018, 4:47 PM

## 2018-08-12 NOTE — [facility] Group Notes (Signed)
[facility] Group Therapy      08/12/2018 3:38 PM  Type of Therapy: Mental Health Association Presentation  Participation Level: Active  Participation Quality: Attentive  Affect: Appropriate  Cognitive: Oriented  Insight: Developing/Improving  Engagement in Therapy: Engaged  Modes of Intervention: Discussion, Education and Socialization  Summary of Progress/Problems: Leary (Naselle) Speaker came to talk about his personal journey with mental health. The pt processed ways by which to relate to the speaker. [place] speaker provided handouts and educational information pertaining to groups and services offered by the [facility]. Pt was engaged in speaker's presentation and was receptive to resources provided.    [place] Social Worker

## 2018-08-12 NOTE — Progress Notes (Signed)
[facility] MD Progress Note  08/12/2018 1:53 PM [name]  MRN:  [id number] Subjective: Patient is a 73 year old female with a probable past psychiatric history significant for a psychotic disorder versus major depression with psychotic features.  She was admitted on 1/28 with worsening psychotic symptoms.  Objective: Patient is seen and examined.  Patient is a 73 year old female with the above-stated past psychiatric history seen in follow-up.  She is pleasant initially today.  She denied any psychotic symptoms.  She is sleeping well.  She denied any side effects to her current medications.  We discussed the possibility of her being followed by either community service team or a ACT team.  She did not sound very thrilled with that.  It may be because of her paranoid symptoms.  She also is very concerned that she be discharged tomorrow.  She stated that her rent is due and she is upset about the fact that she could be in the hospital longer.  We discussed the options of either having social work calling her landlord, or the patient calling her landlord and just explaining that she was in the hospital.  She was very irritable about that.  We decided to add a low-dose of Risperdal 0.5 mg p.o. daily to try and decrease this irritability.  She denied any suicidal ideation.  Her blood pressure this morning is 113/51.  She is mildly tachycardic at a rate of 105-111.  Her previous EKGs revealed a sinus tachycardia with no specific abnormalities.  She reportedly slept 5 hours last night.  Principal Problem: <principal problem not specified> Diagnosis: Active Problems:   MDD (major depressive disorder), recurrent episode, severe ([place])  Total Time spent with patient: 20 minutes  Past Psychiatric History: See admission H&P  Past Medical History:  Past Medical History:  Diagnosis Date  . Anxiety   . Arthritis    rhematoid,osteoarthritis  . Diabetes mellitus without complication ([place])   . Elevated  cholesterol   . Hemorrhoids   . Hypertension   . Hypothyroidism   . Joint pain   . Nocturia   . Sleeping difficulties     Past Surgical History:  Procedure Laterality Date  . ABDOMINAL HYSTERECTOMY    . BACK SURGERY    . CERVICAL FUSION  2004  . CHOLECYSTECTOMY    . EYE SURGERY Left    cataract  . PARS PLANA REPAIR OF RETINAL DEATACHMENT    . THYROIDECTOMY, PARTIAL     Family History:  Family History  Problem Relation Age of Onset  . Diabetes Mother   . Breast cancer Mother   . Gout Mother   . Alzheimer's disease Father   . Heart Problems Father   . Breast cancer Sister   . Alzheimer's disease Sister   . Diabetes Sister   . Gout Sister   . Heart Problems Sister   . Alzheimer's disease Brother   . Diabetes Brother   . Heart Problems Brother    Family Psychiatric  History: See admission H&P Social History:  Social History   Substance and Sexual Activity  Alcohol Use No     Social History   Substance and Sexual Activity  Drug Use No    Social History   Socioeconomic History  . Marital status: Widowed    Spouse name: Not on file  . Number of children: 0  . Years of education: HS  . Highest education level: Not on file  Occupational History  . Not on file  Social Needs  .  Financial resource strain: Not on file  . Food insecurity:    Worry: Not on file    Inability: Not on file  . Transportation needs:    Medical: Not on file    Non-medical: Not on file  Tobacco Use  . Smoking status: Never Smoker  . Smokeless tobacco: Never Used  Substance and Sexual Activity  . Alcohol use: No  . Drug use: No  . Sexual activity: Not Currently  Lifestyle  . Physical activity:    Days per week: Not on file    Minutes per session: Not on file  . Stress: Not on file  Relationships  . Social connections:    Talks on phone: Not on file    Gets together: Not on file    Attends religious service: Not on file    Active member of club or organization: Not on file     Attends meetings of clubs or organizations: Not on file    Relationship status: Not on file  Other Topics Concern  . Not on file  Social History Narrative   Drinks 3 caffeine drinks a day    Additional Social History:                         Sleep: Fair  Appetite:  Fair  Current Medications: Current Facility-Administered Medications  Medication Dose Route Frequency Provider Last Rate Last Dose  . acetaminophen (TYLENOL) tablet 650 mg  650 mg Oral Q6H PRN Suella Broad, FNP   650 mg at 08/11/18 2102  . alum & mag hydroxide-simeth (MAALOX/MYLANTA) 200-200-20 MG/5ML suspension 30 mL  30 mL Oral Q4H PRN Burt Ek, Gayland Curry, FNP      . aspirin EC tablet 81 mg  81 mg Oral Daily Suella Broad, FNP   81 mg at 08/12/18 0818  . atorvastatin (LIPITOR) tablet 40 mg  40 mg Oral Daily Suella Broad, FNP   40 mg at 08/12/18 0819  . escitalopram (LEXAPRO) tablet 20 mg  20 mg Oral Daily Suella Broad, FNP   20 mg at 08/12/18 0818  . feeding supplement (ENSURE ENLIVE) (ENSURE ENLIVE) liquid 237 mL  237 mL Oral BID BM Sharma Covert, MD   237 mL at 08/11/18 1600  . folic acid (FOLVITE) tablet 1 mg  1 mg Oral Daily Suella Broad, FNP   1 mg at 08/12/18 2841  . hydroxychloroquine (PLAQUENIL) tablet 400 mg  400 mg Oral Daily Suella Broad, FNP   400 mg at 08/12/18 0916  . leflunomide (ARAVA) tablet 20 mg  20 mg Oral Daily Suella Broad, FNP   20 mg at 08/12/18 0916  . loratadine (CLARITIN) tablet 10 mg  10 mg Oral Daily Suella Broad, FNP   10 mg at 08/12/18 0819  . magnesium hydroxide (MILK OF MAGNESIA) suspension 30 mL  30 mL Oral Daily PRN Suella Broad, FNP      . [START ON 08/14/2018] methotrexate (RHEUMATREX) tablet 15 mg  15 mg Oral Weekly Sharma Covert, MD      . metoprolol tartrate (LOPRESSOR) tablet 12.5 mg  12.5 mg Oral BID Sharma Covert, MD   12.5 mg at 08/12/18 0818  . pneumococcal 23  valent vaccine (PNU-IMMUNE) injection 0.5 mL  0.5 mL Intramuscular Tomorrow-1000 Sharma Covert, MD      . predniSONE (DELTASONE) tablet 5 mg  5 mg Oral Q breakfast Starkes-Perry, Gayland Curry, FNP  5 mg at 08/12/18 0819  . risperiDONE (RISPERDAL M-TABS) disintegrating tablet 0.5 mg  0.5 mg Oral Daily Sharma Covert, MD   0.5 mg at 08/12/18 1132  . risperiDONE (RISPERDAL M-TABS) disintegrating tablet 2 mg  2 mg Oral QHS Sharma Covert, MD   2 mg at 08/11/18 2102    Lab Results:  Results for orders placed or performed during the hospital encounter of 08/10/18 (from the past 48 hour(s))  Urinalysis, Complete w Microscopic     Status: Abnormal   Collection Time: 08/10/18  2:30 PM  Result Value Ref Range   Color, Urine YELLOW YELLOW   APPearance HAZY (A) CLEAR   Specific Gravity, Urine 1.010 1.005 - 1.030   pH 6.0 5.0 - 8.0   Glucose, UA NEGATIVE NEGATIVE mg/dL   Hgb urine dipstick LARGE (A) NEGATIVE   Bilirubin Urine NEGATIVE NEGATIVE   Ketones, ur 5 (A) NEGATIVE mg/dL   Protein, ur NEGATIVE NEGATIVE mg/dL   Nitrite NEGATIVE NEGATIVE   Leukocytes, UA LARGE (A) NEGATIVE   RBC / HPF >50 (H) 0 - 5 RBC/hpf   WBC, UA 11-20 0 - 5 WBC/hpf   Bacteria, UA RARE (A) NONE SEEN   Squamous Epithelial / LPF 0-5 0 - 5   Mucus PRESENT     Comment: Performed at [facility], [place] [address], [place], [place] [address]  Lithium level     Status: Abnormal   Collection Time: 08/10/18  6:36 PM  Result Value Ref Range   Lithium Lvl <0.06 (L) 0.60 - 1.20 mmol/L    Comment: Performed at [facility], [place] [address] [place], [place] [address]  TSH     Status: Abnormal   Collection Time: 08/10/18  6:36 PM  Result Value Ref Range   TSH 0.297 (L) 0.350 - 4.500 uIU/mL    Comment: Performed by a 3rd Generation assay with a functional sensitivity of <=0.01 uIU/mL. Performed at [facility], [place] [address], [place], [place] [address]   Glucose,  capillary     Status: None   Collection Time: 08/11/18  5:51 AM  Result Value Ref Range   Glucose-Capillary 88 70 - 99 mg/dL  Hemoglobin A1c     Status: Abnormal   Collection Time: 08/11/18  6:43 AM  Result Value Ref Range   Hgb A1c MFr Bld 5.7 (H) 4.8 - 5.6 %    Comment: (NOTE) Pre diabetes:          5.7%-6.4% Diabetes:              >6.4% Glycemic control for   <7.0% adults with diabetes    Mean Plasma Glucose 116.89 mg/dL    Comment: Performed at [place] [address], [place], [place] [address]  Lipid panel     Status: None   Collection Time: 08/11/18  6:43 AM  Result Value Ref Range   Cholesterol 145 0 - 200 mg/dL   Triglycerides 70 <150 mg/dL   HDL 88 >40 mg/dL   Total CHOL/HDL Ratio 1.6 RATIO   VLDL 14 0 - 40 mg/dL   LDL Cholesterol 43 0 - 99 mg/dL    Comment:        Total Cholesterol/HDL:CHD Risk Coronary Heart Disease Risk Table                     Men   Women  1/2 Average Risk   3.4   3.3  Average  Risk       5.0   4.4  2 X Average Risk   9.6   7.1  3 X Average Risk  23.4   11.0        Use the calculated Patient Ratio above and the CHD Risk Table to determine the patient's CHD Risk.        ATP III CLASSIFICATION (LDL):  <100     mg/dL   Optimal  100-129  mg/dL   Near or Above                    Optimal  130-159  mg/dL   Borderline  160-189  mg/dL   High  >190     mg/dL   Very High Performed at [place] [address], [place], [place] [address]   Lithium level     Status: Abnormal   Collection Time: 08/11/18  6:43 AM  Result Value Ref Range   Lithium Lvl <0.06 (L) 0.60 - 1.20 mmol/L    Comment: REPEATED TO VERIFY Performed at [facility], [place] [address], [place], [place] [address]   TSH     Status: None   Collection Time: 08/11/18  6:43 AM  Result Value Ref Range   TSH 0.680 0.350 - 4.500 uIU/mL    Comment: Performed by a 3rd Generation assay with a functional sensitivity of <=0.01  uIU/mL. Performed at [facility], [place] [address] [place], [place] [address]   Urinalysis, Complete w Microscopic     Status: Abnormal   Collection Time: 08/11/18  7:35 AM  Result Value Ref Range   Color, Urine YELLOW YELLOW   APPearance HAZY (A) CLEAR   Specific Gravity, Urine 1.011 1.005 - 1.030   pH 6.0 5.0 - 8.0   Glucose, UA NEGATIVE NEGATIVE mg/dL   Hgb urine dipstick LARGE (A) NEGATIVE   Bilirubin Urine NEGATIVE NEGATIVE   Ketones, ur NEGATIVE NEGATIVE mg/dL   Protein, ur NEGATIVE NEGATIVE mg/dL   Nitrite NEGATIVE NEGATIVE   Leukocytes, UA TRACE (A) NEGATIVE   RBC / HPF >50 (H) 0 - 5 RBC/hpf   WBC, UA 11-20 0 - 5 WBC/hpf   Bacteria, UA RARE (A) NONE SEEN   Squamous Epithelial / LPF 0-5 0 - 5   Mucus PRESENT     Comment: Performed at [facility], [place] [address], [place], [place] [address]  Glucose, capillary     Status: None   Collection Time: 08/12/18  6:09 AM  Result Value Ref Range   Glucose-Capillary 78 70 - 99 mg/dL    Blood Alcohol level:  Lab Results  Component Value Date   ETH <10 08/09/2018   ETH <10 62/26/3335    Metabolic Disorder Labs: Lab Results  Component Value Date   HGBA1C 5.7 (H) 08/11/2018   MPG 116.89 08/11/2018   MPG 105.41 05/25/2018   No results found for: PROLACTIN Lab Results  Component Value Date   CHOL 145 08/11/2018   TRIG 70 08/11/2018   HDL 88 08/11/2018   CHOLHDL 1.6 08/11/2018   VLDL 14 08/11/2018   LDLCALC 43 08/11/2018    Physical Findings: AIMS: Facial and Oral Movements Muscles of Facial Expression: None, normal Lips and Perioral Area: None, normal Jaw: None, normal Tongue: None, normal,Extremity Movements Upper (arms, wrists, hands, fingers): None, normal Lower (legs, knees, ankles, toes): None, normal, Trunk Movements Neck, shoulders, hips: None, normal, Overall Severity Severity of abnormal movements (highest score  from questions above): None, normal Incapacitation due  to abnormal movements: None, normal Patient's awareness of abnormal movements (rate only patient's report): No Awareness, Dental Status Current problems with teeth and/or dentures?: No Does patient usually wear dentures?: Yes  CIWA:    COWS:     Musculoskeletal: Strength & Muscle Tone: within normal limits Gait & Station: normal Patient leans: N/A  Psychiatric Specialty Exam: Physical Exam  Nursing note and vitals reviewed. Constitutional: She is oriented to person, place, and time. She appears well-developed and well-nourished.  HENT:  Head: Normocephalic and atraumatic.  Respiratory: Effort normal.  Neurological: She is alert and oriented to person, place, and time.    ROS  Blood pressure (!) 113/51, pulse (!) 111, temperature 99.2 F (37.3 C), temperature source Oral, resp. rate 18, height 5\' 3"  (1.6 m), weight 60.3 kg.Body mass index is 23.56 kg/m.  General Appearance: Casual  Eye Contact:  Fair  Speech:  Normal Rate  Volume:  Normal  Mood:  Anxious  Affect:  Congruent  Thought Process:  Coherent and Descriptions of Associations: Intact  Orientation:  Full (Time, Place, and Person)  Thought Content:  Delusions and Paranoid Ideation  Suicidal Thoughts:  No  Homicidal Thoughts:  No  Memory:  Immediate;   Fair Recent;   Fair Remote;   Fair  Judgement:  Impaired  Insight:  Lacking  Psychomotor Activity:  Increased  Concentration:  Concentration: Fair and Attention Span: Fair  Recall:  [employer] of Knowledge:  Fair  Language:  Fair  Akathisia:  Negative  Handed:  Right  AIMS (if indicated):     Assets:  Desire for Improvement Financial Resources/Insurance Housing Physical Health Resilience  ADL's:  Intact  Cognition:  WNL  Sleep:  Number of Hours: 4.5     Treatment Plan Summary: Daily contact with patient to assess and evaluate symptoms and progress in treatment, Medication management and Plan : Patient is seen and examined.  Patient is a 72 year old  female with the above-stated past psychiatric history seen in follow-up.  She had appeared to be improving slowly, but this morning became more paranoid with the thoughts of having people come to her home and encourage her to comply with her medications.  She also was upset over the fact that she would be discharged tomorrow to "pay her rent".  I am going to increase her Risperdal to 0.5 mg p.o. daily and 3 mg p.o. nightly.  We will continue the Lexapro at its current dosage.  No change in her medications for her rheumatoid arthritis.  She had prescribed for her metoprolol 12.5 mg p.o. twice daily, but so far she has refused that.  Social work will try and contact her sister for some collateral information and also to clarify her rent issues. 1.  Increase Risperdal to 0.5 mg p.o. daily and 3 mg p.o. nightly for paranoia. 2.  Continue Lexapro 20 mg p.o. daily for mood and anxiety. 3.  Continue prednisone 5 mg p.o. daily for rheumatoid arthritis. 4.  Continue hydroxychloroquine 400 mg p.o. daily for rheumatoid arthritis. 5.  Continue Arava 20 mg p.o. daily for rheumatoid arthritis. 6.  Continue methotrexate 15 mg p.o. q. weekly on Saturdays for rheumatoid arthritis. 7.  Discussed with social work CST or a CTT involvement with patient's for compliance with medications 8.  Encourage patient to attempt to take metoprolol short acting for tachycardia. 9.  Disposition planning-in progress.  Sharma Covert, MD 08/12/2018, 1:53 PM

## 2018-08-13 LAB — GLUCOSE, CAPILLARY: GLUCOSE-CAPILLARY: 102 mg/dL — AB (ref 70–99)

## 2018-08-13 MED ORDER — RISPERIDONE 2 MG PO TBDP
3.0000 mg | ORAL_TABLET | Freq: Every day | ORAL | Status: DC
  Administered 2018-08-13 – 2018-08-15 (×3): 3 mg via ORAL
  Filled 2018-08-13 (×7): qty 1

## 2018-08-13 NOTE — Progress Notes (Signed)
Recreation Therapy Notes  Date:  1.31.20 Time: 0930 Location: [address] Dayroom  Group Topic: Stress Management  Goal Area(s) Addresses:  Patient will identify positive stress management techniques. Patient will identify benefits of using stress management post d/c.  Intervention: Stress Management  Activity :  Progressive Muscle Relaxation.  LRT introduced the stress management technique of progressive muscle relaxation.  LRT read a script that guided patients through the process of tensing and relaxing each muscle group individually.  Patients were to follow along as script was read to engage in activity.  Education:  Stress Management, Discharge Planning.   Education Outcome: Acknowledges Education  Clinical Observations/Feedback:  Pt did not attend group.     Victorino Sparrow, LRT/CTRS        Victorino Sparrow A 08/13/2018 11:20 AM

## 2018-08-13 NOTE — Progress Notes (Signed)
Adult Psychoeducational Group Note  Date:  08/13/2018 Time:  7:19 PM  Group Topic/Focus:  Conflict Resolution:   The focus of this group is to discuss the conflict resolution process and how it may be used upon discharge.  Participation Level:  Active  Participation Quality:  Appropriate  Affect:  Appropriate  Cognitive:  Alert  Insight: Appropriate  Engagement in Group:  Engaged  Modes of Intervention:  Discussion  Additional Comments:  Pt attended group and participated in group discussion.  [name] 08/13/2018, 7:19 PM

## 2018-08-13 NOTE — Progress Notes (Signed)
[facility] MD Progress Note  08/13/2018 3:27 PM [name]  MRN:  [id number] Subjective: Patient is seen and examined.  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  She was admitted on 1/28 with worsening psychotic symptoms.  Objective: Patient is seen and examined.  Patient is a 73 year old female with the above-stated past psychiatric history seen in follow-up.  She remains pretty much pleasant during the course of the hospitalization.  She got irritated yesterday when she was not being discharged in time, but is being much better today.  She stated that having a little bit of Risperdal in the morning has also helped her do well.  Social work is talked her more extensively about the community service team, and they will come and interview her either on Monday or Sunday.  She denied any side effects to her current medications.  She denied any auditory or visual hallucinations.  Her vital signs are stable, she continues to have a mild tachycardia with a rate today between 106 and 111.  She is still having difficulty with sleep with a reported only 4.25 hours last night.  That was with the increased dose of the Risperdal.  Principal Problem: <principal problem not specified> Diagnosis: Active Problems:   MDD (major depressive disorder), recurrent episode, severe (HCC)  Total Time spent with patient: 20 minutes  Past Psychiatric History: See admission H&P  Past Medical History:  Past Medical History:  Diagnosis Date  . Anxiety   . Arthritis    rhematoid,osteoarthritis  . Diabetes mellitus without complication ([place])   . Elevated cholesterol   . Hemorrhoids   . Hypertension   . Hypothyroidism   . Joint pain   . Nocturia   . Sleeping difficulties     Past Surgical History:  Procedure Laterality Date  . ABDOMINAL HYSTERECTOMY    . BACK SURGERY    . CERVICAL FUSION   2004  . CHOLECYSTECTOMY    . EYE SURGERY Left    cataract  . PARS PLANA REPAIR OF RETINAL DEATACHMENT    . THYROIDECTOMY, PARTIAL     Family History:  Family History  Problem Relation Age of Onset  . Diabetes Mother   . Breast cancer Mother   . Gout Mother   . Alzheimer's disease Father   . Heart Problems Father   . Breast cancer Sister   . Alzheimer's disease Sister   . Diabetes Sister   . Gout Sister   . Heart Problems Sister   . Alzheimer's disease Brother   . Diabetes Brother   . Heart Problems Brother    Family Psychiatric  History: See admission H&P Social History:  Social History   Substance and Sexual Activity  Alcohol Use No     Social History   Substance and Sexual Activity  Drug Use No    Social History   Socioeconomic History  . Marital status: Widowed    Spouse name: Not on file  . Number of children: 0  . Years of education: HS  . Highest education level: Not on file  Occupational History  . Not on file  Social Needs  . Financial resource strain: Not on file  . Food insecurity:    Worry: Not on file    Inability: Not on file  . Transportation needs:    Medical: Not on file    Non-medical: Not on file  Tobacco Use  . Smoking  status: Never Smoker  . Smokeless tobacco: Never Used  Substance and Sexual Activity  . Alcohol use: No  . Drug use: No  . Sexual activity: Not Currently  Lifestyle  . Physical activity:    Days per week: Not on file    Minutes per session: Not on file  . Stress: Not on file  Relationships  . Social connections:    Talks on phone: Not on file    Gets together: Not on file    Attends religious service: Not on file    Active member of club or organization: Not on file    Attends meetings of clubs or organizations: Not on file    Relationship status: Not on file  Other Topics Concern  . Not on file  Social History Narrative   Drinks 3 caffeine drinks a day    Additional Social History:                          Sleep: Fair  Appetite:  Fair  Current Medications: Current Facility-Administered Medications  Medication Dose Route Frequency Provider Last Rate Last Dose  . acetaminophen (TYLENOL) tablet 650 mg  650 mg Oral Q6H PRN Suella Broad, FNP   650 mg at 08/12/18 2101  . alum & mag hydroxide-simeth (MAALOX/MYLANTA) 200-200-20 MG/5ML suspension 30 mL  30 mL Oral Q4H PRN Burt Ek, Gayland Curry, FNP      . aspirin EC tablet 81 mg  81 mg Oral Daily Suella Broad, FNP   81 mg at 08/13/18 0746  . atorvastatin (LIPITOR) tablet 40 mg  40 mg Oral Daily Suella Broad, FNP   40 mg at 08/13/18 0745  . escitalopram (LEXAPRO) tablet 20 mg  20 mg Oral Daily Suella Broad, FNP   20 mg at 08/13/18 0746  . feeding supplement (ENSURE ENLIVE) (ENSURE ENLIVE) liquid 237 mL  237 mL Oral BID BM Sharma Covert, MD   237 mL at 08/11/18 1600  . folic acid (FOLVITE) tablet 1 mg  1 mg Oral Daily Suella Broad, FNP   1 mg at 08/13/18 0746  . hydroxychloroquine (PLAQUENIL) tablet 400 mg  400 mg Oral Daily Suella Broad, FNP   400 mg at 08/13/18 0745  . leflunomide (ARAVA) tablet 20 mg  20 mg Oral Daily Suella Broad, FNP   20 mg at 08/13/18 0746  . loratadine (CLARITIN) tablet 10 mg  10 mg Oral Daily Suella Broad, FNP   10 mg at 08/13/18 0745  . magnesium hydroxide (MILK OF MAGNESIA) suspension 30 mL  30 mL Oral Daily PRN Suella Broad, FNP      . [START ON 08/14/2018] methotrexate (RHEUMATREX) tablet 15 mg  15 mg Oral Weekly Sharma Covert, MD      . metoprolol tartrate (LOPRESSOR) tablet 12.5 mg  12.5 mg Oral BID Sharma Covert, MD   12.5 mg at 08/13/18 0746  . pneumococcal 23 valent vaccine (PNU-IMMUNE) injection 0.5 mL  0.5 mL Intramuscular Tomorrow-1000 Sharma Covert, MD      . predniSONE (DELTASONE) tablet 5 mg  5 mg Oral Q breakfast Suella Broad, FNP   5 mg at 08/13/18 0745  . risperiDONE (RISPERDAL  M-TABS) disintegrating tablet 0.5 mg  0.5 mg Oral Daily Sharma Covert, MD   0.5 mg at 08/13/18 0746  . risperiDONE (RISPERDAL M-TABS) disintegrating tablet 2 mg  2 mg Oral QHS Mallie Darting Cordie Grice, MD  2 mg at 08/12/18 2101    Lab Results:  Results for orders placed or performed during the hospital encounter of 08/10/18 (from the past 48 hour(s))  Glucose, capillary     Status: None   Collection Time: 08/12/18  6:09 AM  Result Value Ref Range   Glucose-Capillary 78 70 - 99 mg/dL  Glucose, capillary     Status: Abnormal   Collection Time: 08/13/18  6:24 AM  Result Value Ref Range   Glucose-Capillary 102 (H) 70 - 99 mg/dL    Blood Alcohol level:  Lab Results  Component Value Date   ETH <10 08/09/2018   ETH <10 65/99/3570    Metabolic Disorder Labs: Lab Results  Component Value Date   HGBA1C 5.7 (H) 08/11/2018   MPG 116.89 08/11/2018   MPG 105.41 05/25/2018   No results found for: PROLACTIN Lab Results  Component Value Date   CHOL 145 08/11/2018   TRIG 70 08/11/2018   HDL 88 08/11/2018   CHOLHDL 1.6 08/11/2018   VLDL 14 08/11/2018   LDLCALC 43 08/11/2018    Physical Findings: AIMS: Facial and Oral Movements Muscles of Facial Expression: None, normal Lips and Perioral Area: None, normal Jaw: None, normal Tongue: None, normal,Extremity Movements Upper (arms, wrists, hands, fingers): None, normal Lower (legs, knees, ankles, toes): None, normal, Trunk Movements Neck, shoulders, hips: None, normal, Overall Severity Severity of abnormal movements (highest score from questions above): None, normal Incapacitation due to abnormal movements: None, normal Patient's awareness of abnormal movements (rate only patient's report): No Awareness, Dental Status Current problems with teeth and/or dentures?: No Does patient usually wear dentures?: Yes  CIWA:    COWS:     Musculoskeletal: Strength & Muscle Tone: within normal limits Gait & Station: unsteady Patient leans:  N/A  Psychiatric Specialty Exam: Physical Exam  Nursing note and vitals reviewed. Constitutional: She is oriented to person, place, and time. She appears well-developed and well-nourished.  HENT:  Head: Normocephalic and atraumatic.  Respiratory: Effort normal.  Neurological: She is alert and oriented to person, place, and time.    ROS  Blood pressure (!) 114/53, pulse (!) 111, temperature 99.2 F (37.3 C), temperature source Oral, resp. rate 14, height 5\' 3"  (1.6 m), weight 60.3 kg.Body mass index is 23.56 kg/m.  General Appearance: Casual  Eye Contact:  Fair  Speech:  Normal Rate  Volume:  Normal  Mood:  Euthymic  Affect:  Congruent  Thought Process:  Coherent and Descriptions of Associations: Intact  Orientation:  Full (Time, Place, and Person)  Thought Content:  Delusions and Paranoid Ideation  Suicidal Thoughts:  No  Homicidal Thoughts:  No  Memory:  Immediate;   Fair Recent;   Fair Remote;   Fair  Judgement:  Intact  Insight:  Fair  Psychomotor Activity:  Normal  Concentration:  Concentration: Fair and Attention Span: Fair  Recall:  [employer] of Knowledge:  Fair  Language:  Fair  Akathisia:  Negative  Handed:  Right  AIMS (if indicated):     Assets:  Desire for Improvement Financial Resources/Insurance Housing Physical Health Resilience  ADL's:  Intact  Cognition:  WNL  Sleep:  Number of Hours: 4.25     Treatment Plan Summary: Daily contact with patient to assess and evaluate symptoms and progress in treatment, Medication management and Plan : Patient is seen and examined.  Patient is a 73 year old female with the above-stated past psychiatric history is seen in follow-up. She appears to be a little bit better today.  She stated she was feeling better with the split dose of the Risperdal.  No change in her current medications.  Her blood pressure is still stable, and she still tachycardic.  We will try and increase her metoprolol up to 25 twice daily.   Hopefully she will continue to improve and her sleep will improve during the course the hospitalization. 1.  Continue Risperdal 3 mg p.o. nightly and 0.5 mg p.o. daily for psychosis. 2.  Continue Lexapro 20 mg p.o. daily for mood and anxiety. 3.  Increase Lopressor to 25 mg p.o. twice daily for tachycardia. 4.  No change in hydroxychloroquine,Arana, methotrexate or prednisone for rheumatoid arthritis. 5.  Community service team to interview patient on Sunday or Monday in the next 3 days. 6.  Disposition planning-in progress.  Sharma Covert, MD 08/13/2018, 3:27 PM

## 2018-08-13 NOTE — Progress Notes (Signed)
Patient ID: [name], female   DOB: 01/26/1946, 73 y.o.   MRN: [id number] D: Patient in the dayroom quietly watching TV on approach. Pt mood and affect appears depressed and flat. Pt reports her day is getting better. Pt reports she was disappoint she did not get discharged today but is hopeful about Monday. Pt c/o about rt leg pain/weakness which sometimes causes her pain when walking. Denies  SI/HI/AVH.No behavioral issues noted.  A: Support and encouragement offered as needed to express needs. Walker offered to pt which she states "let me think about it". Writer also suggested pt staying behind during meals with meal brought back to the unit. Medications administered as prescribed.  R: Patient is safe and cooperative on unit. Will continue to monitor  for safety and stability.

## 2018-08-13 NOTE — Plan of Care (Signed)
  Problem: Activity: Goal: Interest or engagement in activities will improve Outcome: Progressing Goal: Sleeping patterns will improve Outcome: Progressing   Problem: Education: Goal: Emotional status will improve Outcome: Not Progressing Goal: Mental status will improve Outcome: Not Progressing   D: Patient reports her sleep has improved.  Her appetite is fair; her energy level is low; concentration is poor.  Patient does not appear to be as weak today.  She denies any thoughts of self harm; she denies AVH.  Patient is visible in the milieu.  She has been on the telephone.  Patient is slow to take her medications.  She rates her depression and hopelessness as a 5; anxiety as a 7.  A: Continue to monitor medication management and MD orders.  Safety checks completed every 15 minutes per protocol.  Offer support and encouragement as needed.  R: Patient is receptive to staff; his/her behavior is appropriate.

## 2018-08-14 DIAGNOSIS — F332 Major depressive disorder, recurrent severe without psychotic features: Principal | ICD-10-CM

## 2018-08-14 LAB — GLUCOSE, CAPILLARY: GLUCOSE-CAPILLARY: 94 mg/dL (ref 70–99)

## 2018-08-14 NOTE — [facility] Group Notes (Signed)
Adult Psychoeducational Group Note  Date:  08/14/2018 Time:  9:26 PM  Group Topic/Focus:  Wrap-Up Group:   The focus of this group is to help patients review their daily goal of treatment and discuss progress on daily workbooks.  Participation Level:  Active  Participation Quality:  Appropriate and Attentive  Affect:  Appropriate  Cognitive:  Alert and Appropriate  Insight: Appropriate and Good  Engagement in Group:  Engaged  Modes of Intervention:  Discussion and Education  Additional Comments:  Pt attended and participated in wrap up group this evening. Pt rated their day a 5/10, due to them not getting a lot accomplished. Pt is still working on a discharge plan, which was their goal for the day.    Cristi Loron 08/14/2018, 9:26 PM

## 2018-08-14 NOTE — [facility] Group Notes (Signed)
LCSW Group Therapy Note  08/14/2018   10:00-11:00am   Type of Therapy and Topic:  Group Therapy: Anger Cues and Responses  Participation Level:  Active   Description of Group:   In this group, patients learned how to recognize the physical, cognitive, emotional, and behavioral responses they have to anger-provoking situations.  They identified a recent time they became angry and how they reacted.  They analyzed how their reaction was possibly beneficial and how it was possibly unhelpful.  The group discussed a variety of healthier coping skills that could help with such a situation in the future.  Deep breathing was practiced briefly.  Therapeutic Goals: 1. Patients will remember their last incident of anger and how they felt emotionally and physically, what their thoughts were at the time, and how they behaved. 2. Patients will identify how their behavior at that time worked for them, as well as how it worked against them. 3. Patients will explore possible new behaviors to use in future anger situations. 4. Patients will learn that anger itself is normal and cannot be eliminated, and that healthier reactions can assist with resolving conflict rather than worsening situations.  Summary of Patient Progress:  The patient shared that her most recent time of anger was Monday in her psychiatrist office and said her anger was caused because what she told the 16 Assistant in confidence was shared with others and she was sent to the hospital unexpectedly.  She felt her trust had been betrayed.  Therapeutic Modalities:   Cognitive Behavioral Therapy  Maretta Los

## 2018-08-14 NOTE — Progress Notes (Signed)
Adult Psychoeducational Group Note  Date:  08/14/2018 Time:  9:05 PM  Group Topic/Focus:  Wrap-Up Group:   The focus of this group is to help patients review their daily goal of treatment and discuss progress on daily workbooks.  Participation Level:  Active  Participation Quality:  Appropriate and Attentive  Affect:  Appropriate  Cognitive:  Alert and Appropriate  Insight: Appropriate and Good  Engagement in Group:  Engaged  Modes of Intervention:  Discussion  Additional Comments pt attend wrap up group. Her day was a 8. She had a good day her family came for a pleasant visit.  [name] Long 08/14/2018, 9:05 PM

## 2018-08-14 NOTE — Progress Notes (Signed)
[facility] MD Progress Note  08/14/2018 3:21 PM KARLE DESROSIER  MRN:  [id number]  Subjective: [name] reports, "I came here because I was feeling very depressed. I'm getting better & I'm doing a lot better on the medications. No side effects".  Objective: Patient is seen and examined.  Patient is a 73 year old female with the above-stated past psychiatric history seen in follow-up.  She remains pretty much pleasant during the course of the hospitalization.  She stated that having a little bit of Risperdal in the morning has also helped her do well.  Social work is talked to her more extensively about the community service team and they will come and interview her either on Monday or Sunday.  She denied any side effects to her current medications.  She denied any auditory or visual hallucinations.  Her vital signs are stable, she continues to have a mild tachycardia with a rate today between 106 and 111.    Principal Problem: MDD (major depressive disorder), recurrent episode, severe ([place])  Diagnosis: Principal Problem:   MDD (major depressive disorder), recurrent episode, severe ([place])  Total Time spent with patient: 15 minutes  Past Psychiatric History: See admission H&P  Past Medical History:  Past Medical History:  Diagnosis Date  . Anxiety   . Arthritis    rhematoid,osteoarthritis  . Diabetes mellitus without complication ([place])   . Elevated cholesterol   . Hemorrhoids   . Hypertension   . Hypothyroidism   . Joint pain   . Nocturia   . Sleeping difficulties     Past Surgical History:  Procedure Laterality Date  . ABDOMINAL HYSTERECTOMY    . BACK SURGERY    . CERVICAL FUSION  2004  . CHOLECYSTECTOMY    . EYE SURGERY Left    cataract  . PARS PLANA REPAIR OF RETINAL DEATACHMENT    . THYROIDECTOMY, PARTIAL     Family History:  Family History  Problem Relation Age of Onset  . Diabetes Mother   . Breast cancer Mother   . Gout Mother   . Alzheimer's disease Father   . Heart  Problems Father   . Breast cancer Sister   . Alzheimer's disease Sister   . Diabetes Sister   . Gout Sister   . Heart Problems Sister   . Alzheimer's disease Brother   . Diabetes Brother   . Heart Problems Brother    Family Psychiatric  History: See admission H&P Social History:  Social History   Substance and Sexual Activity  Alcohol Use No     Social History   Substance and Sexual Activity  Drug Use No    Social History   Socioeconomic History  . Marital status: Widowed    Spouse name: Not on file  . Number of children: 0  . Years of education: HS  . Highest education level: Not on file  Occupational History  . Not on file  Social Needs  . Financial resource strain: Not on file  . Food insecurity:    Worry: Not on file    Inability: Not on file  . Transportation needs:    Medical: Not on file    Non-medical: Not on file  Tobacco Use  . Smoking status: Never Smoker  . Smokeless tobacco: Never Used  Substance and Sexual Activity  . Alcohol use: No  . Drug use: No  . Sexual activity: Not Currently  Lifestyle  . Physical activity:    Days per week: Not on file  Minutes per session: Not on file  . Stress: Not on file  Relationships  . Social connections:    Talks on phone: Not on file    Gets together: Not on file    Attends religious service: Not on file    Active member of club or organization: Not on file    Attends meetings of clubs or organizations: Not on file    Relationship status: Not on file  Other Topics Concern  . Not on file  Social History Narrative   Drinks 3 caffeine drinks a day    Additional Social History:   Sleep: Good  Appetite:  Fair  Current Medications: Current Facility-Administered Medications  Medication Dose Route Frequency Provider Last Rate Last Dose  . acetaminophen (TYLENOL) tablet 650 mg  650 mg Oral Q6H PRN Suella Broad, FNP   650 mg at 08/12/18 2101  . alum & mag hydroxide-simeth (MAALOX/MYLANTA)  200-200-20 MG/5ML suspension 30 mL  30 mL Oral Q4H PRN Burt Ek, Gayland Curry, FNP      . aspirin EC tablet 81 mg  81 mg Oral Daily Suella Broad, FNP   81 mg at 08/14/18 4270  . atorvastatin (LIPITOR) tablet 40 mg  40 mg Oral Daily Suella Broad, FNP   40 mg at 08/14/18 6237  . escitalopram (LEXAPRO) tablet 20 mg  20 mg Oral Daily Suella Broad, FNP   20 mg at 08/14/18 0809  . feeding supplement (ENSURE ENLIVE) (ENSURE ENLIVE) liquid 237 mL  237 mL Oral BID BM Sharma Covert, MD   237 mL at 08/11/18 1600  . folic acid (FOLVITE) tablet 1 mg  1 mg Oral Daily Suella Broad, FNP   1 mg at 08/14/18 0807  . hydroxychloroquine (PLAQUENIL) tablet 400 mg  400 mg Oral Daily Suella Broad, FNP   400 mg at 08/14/18 6283  . leflunomide (ARAVA) tablet 20 mg  20 mg Oral Daily Suella Broad, FNP   20 mg at 08/14/18 1517  . loratadine (CLARITIN) tablet 10 mg  10 mg Oral Daily Suella Broad, FNP   10 mg at 08/14/18 6160  . magnesium hydroxide (MILK OF MAGNESIA) suspension 30 mL  30 mL Oral Daily PRN Starkes-Perry, Gayland Curry, FNP      . methotrexate (RHEUMATREX) tablet 15 mg  15 mg Oral Weekly Sharma Covert, MD   15 mg at 08/14/18 0809  . metoprolol tartrate (LOPRESSOR) tablet 12.5 mg  12.5 mg Oral BID Sharma Covert, MD   12.5 mg at 08/14/18 7371  . pneumococcal 23 valent vaccine (PNU-IMMUNE) injection 0.5 mL  0.5 mL Intramuscular Tomorrow-1000 Sharma Covert, MD      . predniSONE (DELTASONE) tablet 5 mg  5 mg Oral Q breakfast Suella Broad, FNP   5 mg at 08/14/18 0626  . risperiDONE (RISPERDAL M-TABS) disintegrating tablet 0.5 mg  0.5 mg Oral Daily Sharma Covert, MD   0.5 mg at 08/14/18 9485  . risperiDONE (RISPERDAL M-TABS) disintegrating tablet 3 mg  3 mg Oral QHS Sharma Covert, MD   3 mg at 08/13/18 2144   Lab Results:  Results for orders placed or performed during the hospital encounter of 08/10/18 (from the past  48 hour(s))  Glucose, capillary     Status: Abnormal   Collection Time: 08/13/18  6:24 AM  Result Value Ref Range   Glucose-Capillary 102 (H) 70 - 99 mg/dL  Glucose, capillary     Status: None  Collection Time: 08/14/18  6:34 AM  Result Value Ref Range   Glucose-Capillary 94 70 - 99 mg/dL   Blood Alcohol level:  Lab Results  Component Value Date   ETH <10 08/09/2018   ETH <10 75/04/2584   Metabolic Disorder Labs: Lab Results  Component Value Date   HGBA1C 5.7 (H) 08/11/2018   MPG 116.89 08/11/2018   MPG 105.41 05/25/2018   No results found for: PROLACTIN Lab Results  Component Value Date   CHOL 145 08/11/2018   TRIG 70 08/11/2018   HDL 88 08/11/2018   CHOLHDL 1.6 08/11/2018   VLDL 14 08/11/2018   LDLCALC 43 08/11/2018   Physical Findings: AIMS: Facial and Oral Movements Muscles of Facial Expression: None, normal Lips and Perioral Area: None, normal Jaw: None, normal Tongue: None, normal,Extremity Movements Upper (arms, wrists, hands, fingers): None, normal Lower (legs, knees, ankles, toes): None, normal, Trunk Movements Neck, shoulders, hips: None, normal, Overall Severity Severity of abnormal movements (highest score from questions above): None, normal Incapacitation due to abnormal movements: None, normal Patient's awareness of abnormal movements (rate only patient's report): No Awareness, Dental Status Current problems with teeth and/or dentures?: No Does patient usually wear dentures?: Yes  CIWA:    COWS:     Musculoskeletal: Strength & Muscle Tone: within normal limits Gait & Station: unsteady Patient leans: N/A  Psychiatric Specialty Exam: Physical Exam  Nursing note and vitals reviewed. Constitutional: She is oriented to person, place, and time. She appears well-developed and well-nourished.  HENT:  Head: Normocephalic and atraumatic.  Respiratory: Effort normal.  Neurological: She is alert and oriented to person, place, and time.    Review of  Systems  Respiratory: Negative.  Negative for cough and shortness of breath.   Cardiovascular: Negative.  Negative for chest pain and palpitations.  Gastrointestinal: Negative.  Negative for abdominal pain, heartburn, nausea and vomiting.  Genitourinary: Negative.   Musculoskeletal: Positive for joint pain and myalgias.       Walks with a cane  Neurological: Negative for dizziness and headaches.  Psychiatric/Behavioral: Positive for depression ("Improving') and substance abuse (Benzodiazepine use disorder). Negative for hallucinations, memory loss and suicidal ideas. The patient is not nervous/anxious and does not have insomnia ("Improving").     Blood pressure (!) 121/54, pulse (!) 108, temperature 99.2 F (37.3 C), temperature source Oral, resp. rate 14, height 5\' 3"  (1.6 m), weight 60.3 kg.Body mass index is 23.56 kg/m.  General Appearance: Casual  Eye Contact:  Fair  Speech:  Normal Rate  Volume:  Normal  Mood:  Euthymic  Affect:  Congruent  Thought Process:  Coherent and Descriptions of Associations: Intact  Orientation:  Full (Time, Place, and Person)  Thought Content:  Delusions and Paranoid Ideation  Suicidal Thoughts:  No  Homicidal Thoughts:  No  Memory:  Immediate;   Fair Recent;   Fair Remote;   Fair  Judgement:  Intact  Insight:  Fair  Psychomotor Activity:  Normal  Concentration:  Concentration: Fair and Attention Span: Fair  Recall:  [employer] of Knowledge:  Fair  Language:  Fair  Akathisia:  Negative  Handed:  Right  AIMS (if indicated):     Assets:  Desire for Improvement Financial Resources/Insurance Housing Physical Health Resilience  ADL's:  Intact  Cognition:  WNL  Sleep:  Number of Hours: 4.25   Treatment Plan Summary: Daily contact with patient to assess and evaluate symptoms and progress in treatment, Medication management and Plan : Patient is seen and examined.  Patient is a 73 year old female with the above-stated past psychiatric history is  seen in follow-up.   She appears to be a little bit better today.  She stated she was feeling better with the split dose of the Risperdal.  No change in her current medications.  Her blood pressure is still stable and she still tachycardic.  Will continue her metoprolol 25 twice daily.  Hopefully she will continue to improve and her sleep will improve during the course the hospitalization.  1.  Continue Risperdal 3 mg p.o. nightly and 0.5 mg p.o. daily for psychosis.  2.  Continue Lexapro 20 mg p.o. daily for mood and anxiety.  3.  Continue Lopressor 25 mg p.o. twice daily for tachycardia.  4.  No change in hydroxychloroquine, Arana, methotrexate or prednisone for rheumatoid arthritis.  5.  Community service team to interview patient on Sunday or Monday in the next 3 days.  6.  Disposition planning-in progress.  Lindell Spar, NP, PMHNP, FNP-BC 08/14/2018, 3:21 PMPatient ID: [name], female   DOB: 1946-01-22, 73 y.o.   MRN: [id number]

## 2018-08-14 NOTE — Plan of Care (Signed)
  Problem: Education: Goal: Knowledge of [facility] Education information/materials will improve Outcome: Progressing Goal: Emotional status will improve Outcome: Progressing Goal: Mental status will improve Outcome: Progressing   Problem: Activity: Goal: Sleeping patterns will improve Outcome: Progressing   D: Patient is progressing with her treatment.  She is interacting with her peers; she is attending some groups.  Patient denies any thoughts of self harm.  She is currently ambulating with a walker due to hip pain.  She is compliant with her medications.  Patient is sleeping and eating well.  Her energy level remains low.  She rates her depression, hopelessness and anxiety as a 3.  Her goal today is to get "discharge information."   A: Continue to monitor medication management and MD orders.  Safety checks completed every 15 minutes per protocol.  Offer support and encouragement as needed.  R: Patient is receptive to staff; his/her behavior is appropriate.

## 2018-08-15 LAB — GLUCOSE, CAPILLARY: Glucose-Capillary: 93 mg/dL (ref 70–99)

## 2018-08-15 NOTE — [facility] Group Notes (Signed)
[place] Group Notes:  (Nursing/MHT/Case Management/Adjunct)  Date:  08/15/2018  Time:  2:02 PM  Type of Therapy:  Psychoeducational Skills  Participation Level:  Active  Participation Quality:  Appropriate  Affect:  Appropriate  Cognitive:  Appropriate  Insight:  Appropriate  Engagement in Group:  Engaged  Modes of Intervention:  Problem-solving  Summary of Progress/Problems: Pt attended Psychoeducational group with the topic healthy support systems.   [name] Shanta 08/15/2018, 2:02 PM

## 2018-08-15 NOTE — [facility] Group Notes (Signed)
[place] LCSW Group Therapy Note  08/15/2018  10:00-11:00AM  Type of Therapy and Topic:  Group Therapy:  Grief   Participation Level:  Active   Description of Group:  Patients in this group were introduced to the idea that, just as anger is a secondary emotion, grief is  a primary emotion.  They learned about one model of grief which maintains that there are four tasks of mourning, including (1) accepting the reality of the loss, (2) working through the pain of grief, (3) adjusting to life without the deceased, and (4) maintaining a connection to the deceased while moving on with life.  This led to a discussion about patients' grief and what their experiences have been in going through this process.  Therapeutic Goals: 1)  learn about the four tasks of mourning 2)  discuss patients' direct experiences in going through these tasks  3)  give an opportunity to write a list of regrets, resentments, and appreciations of a specific person lost   4)  allow sharing and discuss rituals that could possibly help in the grief process such as burying or burning the list, tearing it up and scattering it, and more   Summary of Patient Progress:  The patient's most impactful loss in her life has been her brother, which happened last year, although she also lost her father in 49, mother in 35, husband in 2007, and a  sister in 2010.  The patient expressed that she regrets not spending more time with her brother, but she has no resentments toward him .  Therapeutic Modalities:   Motivational Interviewing Activity  Maretta Los

## 2018-08-15 NOTE — Progress Notes (Signed)
Writer observed patient sitting in the dayroom watching tv. She reports having had a good day. She did voice concern about some things her social worker is checking on. She was informed of her medications and is agreeable to taking them. She received tylenol for her knee and leg pain. Writer will monitor effectiveness of medication. Safety maintained on unit with 15 min checks.

## 2018-08-15 NOTE — Progress Notes (Signed)
[facility] MD Progress Note  08/15/2018 1:38 PM [name]  MRN:  [id number]  Subjective: [name] reports, "I'm doing fine. I'm just hoping that I will be discharged in the morning. I'm looking forward to the interview with the community service team tomorrow. Hopefully, I will get discharged after the interview".  Objective: Patient is seen and examined.  Patient is a 73 year old female with the above-stated past psychiatric history seen in follow-up.  She remains pretty much pleasant during the course of the hospitalization.  She stated that having a little bit of Risperdal in the morning has also helped her do well.  Social work is talked to her more extensively about the community service team and they will come and interview her either on Monday or Sunday.  She denied any side effects to her current medications.  She denied any auditory or visual hallucinations.  Her vital signs are stable. She is hoping for a discharge tomorrow.   Principal Problem: MDD (major depressive disorder), recurrent episode, severe ([place])  Diagnosis: Principal Problem:   MDD (major depressive disorder), recurrent episode, severe ([place])  Total Time spent with patient: 15 minutes  Past Psychiatric History: See admission H&P  Past Medical History:  Past Medical History:  Diagnosis Date  . Anxiety   . Arthritis    rhematoid,osteoarthritis  . Diabetes mellitus without complication ([place])   . Elevated cholesterol   . Hemorrhoids   . Hypertension   . Hypothyroidism   . Joint pain   . Nocturia   . Sleeping difficulties     Past Surgical History:  Procedure Laterality Date  . ABDOMINAL HYSTERECTOMY    . BACK SURGERY    . CERVICAL FUSION  2004  . CHOLECYSTECTOMY    . EYE SURGERY Left    cataract  . PARS PLANA REPAIR OF RETINAL DEATACHMENT    . THYROIDECTOMY, PARTIAL     Family History:  Family History  Problem Relation Age of Onset  . Diabetes Mother   . Breast cancer Mother   . Gout Mother   .  Alzheimer's disease Father   . Heart Problems Father   . Breast cancer Sister   . Alzheimer's disease Sister   . Diabetes Sister   . Gout Sister   . Heart Problems Sister   . Alzheimer's disease Brother   . Diabetes Brother   . Heart Problems Brother    Family Psychiatric  History: See admission H&P Social History:  Social History   Substance and Sexual Activity  Alcohol Use No     Social History   Substance and Sexual Activity  Drug Use No    Social History   Socioeconomic History  . Marital status: Widowed    Spouse name: Not on file  . Number of children: 0  . Years of education: HS  . Highest education level: Not on file  Occupational History  . Not on file  Social Needs  . Financial resource strain: Not on file  . Food insecurity:    Worry: Not on file    Inability: Not on file  . Transportation needs:    Medical: Not on file    Non-medical: Not on file  Tobacco Use  . Smoking status: Never Smoker  . Smokeless tobacco: Never Used  Substance and Sexual Activity  . Alcohol use: No  . Drug use: No  . Sexual activity: Not Currently  Lifestyle  . Physical activity:    Days per week: Not on file  Minutes per session: Not on file  . Stress: Not on file  Relationships  . Social connections:    Talks on phone: Not on file    Gets together: Not on file    Attends religious service: Not on file    Active member of club or organization: Not on file    Attends meetings of clubs or organizations: Not on file    Relationship status: Not on file  Other Topics Concern  . Not on file  Social History Narrative   Drinks 3 caffeine drinks a day    Additional Social History:   Sleep: Good  Appetite:  Fair  Current Medications: Current Facility-Administered Medications  Medication Dose Route Frequency Provider Last Rate Last Dose  . acetaminophen (TYLENOL) tablet 650 mg  650 mg Oral Q6H PRN Suella Broad, FNP   650 mg at 08/14/18 2053  . alum & mag  hydroxide-simeth (MAALOX/MYLANTA) 200-200-20 MG/5ML suspension 30 mL  30 mL Oral Q4H PRN Burt Ek, Gayland Curry, FNP      . aspirin EC tablet 81 mg  81 mg Oral Daily Suella Broad, FNP   81 mg at 08/15/18 3734  . atorvastatin (LIPITOR) tablet 40 mg  40 mg Oral Daily Suella Broad, FNP   40 mg at 08/15/18 2876  . escitalopram (LEXAPRO) tablet 20 mg  20 mg Oral Daily Suella Broad, FNP   20 mg at 08/15/18 8115  . feeding supplement (ENSURE ENLIVE) (ENSURE ENLIVE) liquid 237 mL  237 mL Oral BID BM Sharma Covert, MD   237 mL at 08/11/18 1600  . folic acid (FOLVITE) tablet 1 mg  1 mg Oral Daily Suella Broad, FNP   1 mg at 08/15/18 7262  . hydroxychloroquine (PLAQUENIL) tablet 400 mg  400 mg Oral Daily Suella Broad, FNP   400 mg at 08/15/18 0355  . leflunomide (ARAVA) tablet 20 mg  20 mg Oral Daily Suella Broad, FNP   20 mg at 08/15/18 0825  . loratadine (CLARITIN) tablet 10 mg  10 mg Oral Daily Suella Broad, FNP   10 mg at 08/15/18 0825  . magnesium hydroxide (MILK OF MAGNESIA) suspension 30 mL  30 mL Oral Daily PRN Starkes-Perry, Gayland Curry, FNP      . methotrexate (RHEUMATREX) tablet 15 mg  15 mg Oral Weekly Sharma Covert, MD   15 mg at 08/14/18 0809  . metoprolol tartrate (LOPRESSOR) tablet 12.5 mg  12.5 mg Oral BID Sharma Covert, MD   12.5 mg at 08/15/18 0825  . pneumococcal 23 valent vaccine (PNU-IMMUNE) injection 0.5 mL  0.5 mL Intramuscular Tomorrow-1000 Sharma Covert, MD      . predniSONE (DELTASONE) tablet 5 mg  5 mg Oral Q breakfast Suella Broad, FNP   5 mg at 08/15/18 0825  . risperiDONE (RISPERDAL M-TABS) disintegrating tablet 0.5 mg  0.5 mg Oral Daily Sharma Covert, MD   0.5 mg at 08/15/18 9741  . risperiDONE (RISPERDAL M-TABS) disintegrating tablet 3 mg  3 mg Oral QHS Sharma Covert, MD   3 mg at 08/14/18 2053   Lab Results:  Results for orders placed or performed during the hospital  encounter of 08/10/18 (from the past 48 hour(s))  Glucose, capillary     Status: None   Collection Time: 08/14/18  6:34 AM  Result Value Ref Range   Glucose-Capillary 94 70 - 99 mg/dL  Glucose, capillary     Status: None  Collection Time: 08/15/18  6:43 AM  Result Value Ref Range   Glucose-Capillary 93 70 - 99 mg/dL   Blood Alcohol level:  Lab Results  Component Value Date   ETH <10 08/09/2018   ETH <10 93/57/0177   Metabolic Disorder Labs: Lab Results  Component Value Date   HGBA1C 5.7 (H) 08/11/2018   MPG 116.89 08/11/2018   MPG 105.41 05/25/2018   No results found for: PROLACTIN Lab Results  Component Value Date   CHOL 145 08/11/2018   TRIG 70 08/11/2018   HDL 88 08/11/2018   CHOLHDL 1.6 08/11/2018   VLDL 14 08/11/2018   LDLCALC 43 08/11/2018   Physical Findings: AIMS: Facial and Oral Movements Muscles of Facial Expression: None, normal Lips and Perioral Area: None, normal Jaw: None, normal Tongue: None, normal,Extremity Movements Upper (arms, wrists, hands, fingers): None, normal Lower (legs, knees, ankles, toes): None, normal, Trunk Movements Neck, shoulders, hips: None, normal, Overall Severity Severity of abnormal movements (highest score from questions above): None, normal Incapacitation due to abnormal movements: None, normal Patient's awareness of abnormal movements (rate only patient's report): No Awareness, Dental Status Current problems with teeth and/or dentures?: No Does patient usually wear dentures?: Yes  CIWA:  CIWA-Ar Total: 1 COWS:     Musculoskeletal: Strength & Muscle Tone: within normal limits Gait & Station: unsteady Patient leans: N/A  Psychiatric Specialty Exam: Physical Exam  Nursing note and vitals reviewed. Constitutional: She is oriented to person, place, and time. She appears well-developed and well-nourished.  HENT:  Head: Normocephalic and atraumatic.  Respiratory: Effort normal.  Neurological: She is alert and oriented  to person, place, and time.    Review of Systems  Respiratory: Negative.  Negative for cough and shortness of breath.   Cardiovascular: Negative.  Negative for chest pain and palpitations.  Gastrointestinal: Negative.  Negative for abdominal pain, heartburn, nausea and vomiting.  Genitourinary: Negative.   Musculoskeletal: Positive for joint pain and myalgias.       Walks with a cane  Neurological: Negative for dizziness and headaches.  Psychiatric/Behavioral: Positive for depression ("Improving') and substance abuse (Benzodiazepine use disorder). Negative for hallucinations, memory loss and suicidal ideas. The patient is not nervous/anxious and does not have insomnia ("Improving").     Blood pressure (!) 115/48, pulse 85, temperature (!) 97.3 F (36.3 C), temperature source Oral, resp. rate 16, height 5\' 3"  (1.6 m), weight 60.3 kg.Body mass index is 23.56 kg/m.  General Appearance: Casual  Eye Contact:  Fair  Speech:  Normal Rate  Volume:  Normal  Mood:  Euthymic  Affect:  Congruent  Thought Process:  Coherent and Descriptions of Associations: Intact  Orientation:  Full (Time, Place, and Person)  Thought Content:  Delusions and Paranoid Ideation  Suicidal Thoughts:  No  Homicidal Thoughts:  No  Memory:  Immediate;   Fair Recent;   Fair Remote;   Fair  Judgement:  Intact  Insight:  Fair  Psychomotor Activity:  Normal  Concentration:  Concentration: Fair and Attention Span: Fair  Recall:  [employer] of Knowledge:  Fair  Language:  Fair  Akathisia:  Negative  Handed:  Right  AIMS (if indicated):     Assets:  Desire for Improvement Financial Resources/Insurance Housing Physical Health Resilience  ADL's:  Intact  Cognition:  WNL  Sleep:  Number of Hours: 4.25   Treatment Plan Summary: Daily contact with patient to assess and evaluate symptoms and progress in treatment, Medication management and Plan : Patient is seen and examined.  Patient is a 73 year old female with the  above-stated past psychiatric history is seen in follow-up.   She appears to be better today.  No change in her current medications.  Looking forward & hoping that she will be discharged in the morning.  Continue inpatient hospitalization.  Will continue today 08/15/2018 plan as below except where it is noted.  1.  Continue Risperdal 3 mg p.o. nightly and 0.5 mg p.o. daily for psychosis.  2.  Continue Lexapro 20 mg p.o. daily for mood and anxiety.  3.  Continue Lopressor 25 mg p.o. twice daily for tachycardia.  4.  No change in hydroxychloroquine, Arana, methotrexate or prednisone for rheumatoid arthritis.  5.  Community service team to interview patient on Sunday or Monday in the next 3 days.  6.  Disposition planning-in progress (Patient is hoping to be discharged in the morning).  Lindell Spar, NP, PMHNP, FNP-BC 08/15/2018, 1:38 PMPatient ID: [name], female   DOB: 11-Apr-1946, 73 y.o.   MRN: [id number]

## 2018-08-15 NOTE — Progress Notes (Signed)
D:  [name] was sitting in the day room much of the evening.  She was quiet with minimal interaction with peers.  She denied SI/HI or A/V hallucinations.  She did complain of pain 6/10 for chronic arthritis pain and tylenol given with good relief.  She reported that her day "good" and she attended groups.  She is hoping to be leaving tomorrow.  "I'm ready to go."  She took her hs medications without difficulty.  She is currently resting with her eyes closed and appears to be asleep. A:  1:1 with RN for support and encouragement.  Medications as ordered.  Q 15 minute checks maintained for safety.  Encouraged participation in group and unit activities.   R:  Kelsy remains safe on the unit.  We will continue to monitor the progress towards her goals.

## 2018-08-15 NOTE — Progress Notes (Signed)
D:  Patient's self inventory sheet, patient has fair sleep, no sleep medication.  Good appetite, low energy level, good concentration.  Rated depression, hopeless and anxiety #4.  Denied withdrawals.  Denied SI.  Physical problems, pain, legs, worst pain #4, no pain medicine.  Goal is dealing with negative situations or people.  Goal is make negative into positive.  Does have discharge plans. A:  Medications administered per MD orders.  Emotional support and encouragement given patient. R:  Denied SI and HI, contracts for safety.  Denied A/V hallucinations.  Safety maintained with 15 minute checks.

## 2018-08-15 NOTE — Plan of Care (Signed)
Nurse discussed anxiety, depression and coping skills with patient.  

## 2018-08-16 LAB — GLUCOSE, CAPILLARY: Glucose-Capillary: 86 mg/dL (ref 70–99)

## 2018-08-16 MED ORDER — CETIRIZINE HCL 10 MG PO TABS: 10.0000 mg | ORAL_TABLET | Freq: Every day | ORAL | 0 refills | Status: DC | PRN

## 2018-08-16 MED ORDER — ASPIRIN EC 81 MG PO TBEC: 81.0000 mg | DELAYED_RELEASE_TABLET | Freq: Every day | ORAL | 0 refills | Status: DC

## 2018-08-16 MED ORDER — RISPERIDONE 3 MG PO TBDP: 3.0000 mg | ORAL_TABLET | Freq: Every day | ORAL | 0 refills | Status: DC

## 2018-08-16 MED ORDER — METHOTREXATE SODIUM 15 MG PO TABS: 15.0000 mg | ORAL_TABLET | ORAL | 0 refills | Status: DC

## 2018-08-16 MED ORDER — RISPERIDONE 0.5 MG PO TBDP: 0.5000 mg | ORAL_TABLET | Freq: Every day | ORAL | 0 refills | Status: DC

## 2018-08-16 MED ORDER — ATORVASTATIN CALCIUM 40 MG PO TABS: 40.0000 mg | ORAL_TABLET | Freq: Every day | ORAL | 0 refills | Status: DC

## 2018-08-16 MED ORDER — ESCITALOPRAM OXALATE 20 MG PO TABS: 20.0000 mg | ORAL_TABLET | Freq: Every day | ORAL | 0 refills | Status: DC

## 2018-08-16 MED ORDER — METOPROLOL TARTRATE 25 MG PO TABS: 12.5000 mg | ORAL_TABLET | Freq: Two times a day (BID) | ORAL | 0 refills | Status: DC

## 2018-08-16 NOTE — Progress Notes (Signed)
Recreation Therapy Notes  Date:  2.3.20 Time: 0930 Location: 300 Hall Dayroom  Group Topic: Stress Management  Goal Area(s) Addresses:  Patient will identify positive stress management techniques. Patient will identify benefits of using stress management post d/c.  Intervention:  Stress Management  Activity :  Guided Imagery.  LRT introduced the stress management technique of guided imagery.  LRT read a script that took patients on a journey to the beach at sunrise.  Patients were to listen and follow along as script was read by LRT to engage in activity.  Education:  Stress Management, Discharge Planning.   Education Outcome: Acknowledges Education  Clinical Observations/Feedback: Pt did not attend group.      Jazmyn Offner, LRT/CTRS         Makhia Vosler A 08/16/2018 11:00 AM 

## 2018-08-16 NOTE — BHH Suicide Risk Assessment (Signed)
[facility] Discharge Suicide Risk Assessment   Principal Problem: MDD (major depressive disorder), recurrent episode, severe ([place]) Discharge Diagnoses: Principal Problem:   MDD (major depressive disorder), recurrent episode, severe ([place])   Total Time spent with patient: 20 minutes  Musculoskeletal: Strength & Muscle Tone: within normal limits Gait & Station: unsteady Patient leans: N/A  Psychiatric Specialty Exam: Review of Systems  Psychiatric/Behavioral: Positive for memory loss.  All other systems reviewed and are negative.   Blood pressure 102/69, pulse (!) 108, temperature 99.5 F (37.5 C), temperature source Oral, resp. rate 16, height 5\' 3"  (1.6 m), weight 60.3 kg.Body mass index is 23.56 kg/m.  General Appearance: Casual  Eye Contact::  Good  Speech:  Normal Rate409  Volume:  Normal  Mood:  Euthymic  Affect:  Congruent  Thought Process:  Coherent and Descriptions of Associations: Intact  Orientation:  Full (Time, Place, and Person)  Thought Content:  Logical  Suicidal Thoughts:  No  Homicidal Thoughts:  No  Memory:  Immediate;   Fair Recent;   Fair Remote;   Fair  Judgement:  Intact  Insight:  Fair  Psychomotor Activity:  Normal  Concentration:  Fair  Recall:  [employer] of Knowledge:Good  Language: Good  Akathisia:  Negative  Handed:  Right  AIMS (if indicated):     Assets:  Desire for Improvement Financial Resources/Insurance Housing Leisure Time Physical Health Resilience  Sleep:  Number of Hours: 5.25  Cognition: WNL  ADL's:  Intact   Mental Status Per Nursing Assessment::   On Admission:  NA  Demographic Factors:  Age 73 or older, Divorced or widowed, Caucasian, Living alone and Unemployed  Loss Factors: NA  Historical Factors: Impulsivity  Risk Reduction Factors:   NA  Continued Clinical Symptoms:  Depression:   Impulsivity Schizophrenia:   Depressive state  Cognitive Features That Contribute To Risk:  Thought constriction (tunnel  vision)    Suicide Risk:  Minimal: No identifiable suicidal ideation.  Patients presenting with no risk factors but with morbid ruminations; may be classified as minimal risk based on the severity of the depressive symptoms  Follow-up Information    Group[facility].   Specialty:  Behavioral Health Why:  Medication management appointment with [name] is 2/6 at 11:20a. Please bring your current medications and discharge paperwork from this hospitalization. Contact information: [address] [place] [address] [phone]        [facility] Follow up on 08/13/2018.   Why:  Referral made 1/30. CST will come do an assestment on Monday 2/3.  Contact information: [place] [address] [phone]           Plan Of Care/Follow-up recommendations:  Activity:  ad lib  Sharma Covert, MD 08/16/2018, 9:33 AM

## 2018-08-16 NOTE — Discharge Summary (Signed)
Physician Discharge Summary Note  Patient:  [name] is an 73 y.o., female MRN:  [id number] DOB:  30-Jul-1945 Patient phone:  [phone] (home)  Patient address:   [address] [place] [place] [address],  Total Time spent with patient: 15 minutes  Date of Admission:  08/10/2018 Date of Discharge: 08/16/2018  Reason for Admission:  Suicidal ideation  Principal Problem: MDD (major depressive disorder), recurrent episode, severe ([place]) Discharge Diagnoses: Principal Problem:   MDD (major depressive disorder), recurrent episode, severe ([place])   Past Psychiatric History: Per admission H&P: Patient has been followed as an outpatient psychiatrically for many years.  She has been treated for anxiety and depression.  It sounds as though the only psychiatric hospitalization that she had was the one in November 2019.  She was at [facility]- psychiatric unit.  She has been on multiple medications in the past.  Please see the HPI for a fairly extensive list.  Past Medical History:  Past Medical History:  Diagnosis Date  . Anxiety   . Arthritis    rhematoid,osteoarthritis  . Diabetes mellitus without complication ([place])   . Elevated cholesterol   . Hemorrhoids   . Hypertension   . Hypothyroidism   . Joint pain   . Nocturia   . Sleeping difficulties     Past Surgical History:  Procedure Laterality Date  . ABDOMINAL HYSTERECTOMY    . BACK SURGERY    . CERVICAL FUSION  2004  . CHOLECYSTECTOMY    . EYE SURGERY Left    cataract  . PARS PLANA REPAIR OF RETINAL DEATACHMENT    . THYROIDECTOMY, PARTIAL     Family History:  Family History  Problem Relation Age of Onset  . Diabetes Mother   . Breast cancer Mother   . Gout Mother   . Alzheimer's disease Father   . Heart Problems Father   . Breast cancer Sister   . Alzheimer's disease Sister   . Diabetes Sister   . Gout Sister   . Heart Problems Sister   . Alzheimer's disease Brother   . Diabetes Brother    . Heart Problems Brother    Family Psychiatric  History: Per admission H&P: Reportedly her brother has schizophrenia. Social History:  Social History   Substance and Sexual Activity  Alcohol Use No     Social History   Substance and Sexual Activity  Drug Use No    Social History   Socioeconomic History  . Marital status: Widowed    Spouse name: Not on file  . Number of children: 0  . Years of education: HS  . Highest education level: Not on file  Occupational History  . Not on file  Social Needs  . Financial resource strain: Not on file  . Food insecurity:    Worry: Not on file    Inability: Not on file  . Transportation needs:    Medical: Not on file    Non-medical: Not on file  Tobacco Use  . Smoking status: Never Smoker  . Smokeless tobacco: Never Used  Substance and Sexual Activity  . Alcohol use: No  . Drug use: No  . Sexual activity: Not Currently  Lifestyle  . Physical activity:    Days per week: Not on file    Minutes per session: Not on file  . Stress: Not on file  Relationships  . Social connections:    Talks on phone: Not on file    Gets together: Not on  file    Attends religious service: Not on file    Active member of club or organization: Not on file    Attends meetings of clubs or organizations: Not on file    Relationship status: Not on file  Other Topics Concern  . Not on file  Social History Narrative   Drinks 3 caffeine drinks a day     Hospital Course:  From admission H&P 08/10/2018: Patient is a 73 year old female with a reported past psychiatric history significant for major depression with psychotic features who presented to the [facility] emergency department on 1/28 with depression and suicidal ideation. The patient was recently seen by her primary psychiatric provider. She had apparently told her therapist that she wanted take a handful of pills and end her life. She stated she felt hopeless. She endorsed  symptoms of paranoia including being frightened by someone who lives down from her in her assisted care facility. She also stated that she felt like there were listening devices in her apartment. She felt as though her sister-in-law may have been somewhat involved with this. She had a recent hospitalization secondary to an overdose in November 2019. At that time she was hospitalized in the medical hospital for approximately 11 days, then transferred to the [facility] unit. She was hospitalized there from 05/2211/4 2019. She was discharged on Risperdal 2 mg p.o. nightly and Lexapro 20 mg p.o. daily. She is also on multiple medications for rheumatoid arthritis, hypertension, hyperlipidemia and hypertension. Review of the electronic medical record revealed that she is been on multiple medication changes over the last 6 months. She has been on Seroquel, Risperdal, Zyprexa, Abilify, Lexapro, Celexa, Paxil, Xanax,lithium and Depakote. She stated that she is noncompliant with these medications because of her fear of them. Notes in the chart were reviewed that showed that she had had problems psychiatrically since first grade where her sister once stated that she did not speak the first year of school. 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 She has not worked in approximately 10 years. She is significantly anxious, and significantly paranoid. She currently denied suicidal ideation at this time.   [name] was admitted after expressing suicidal ideation to her outpatient provider. On admission she expressed paranoid thoughts that someone at her assisted living facility was following her and that they had bugged her room to listen and watch her. She was reluctant to elaborate. She also stated she had not been compliant with psychotropic medications due to paranoia  and worries that providers at [facility] were not "real doctors." She was restarted and titrated up on Risperdal, which she had received during recent hospitalization at [facility]. Lexapro was continued. Medications for chronic medical problems were continued. Metoprolol 12.5 mg was started BID for heart rate control. [name] had an assessment with [facility] for [facility] on day of discharge (see below). [name] remained on the [facility] unit for 7 days. She stabilized with medication and therapy. She was discharged on the medications listed below. She has shown improvement with improved mood, affect, sleep, appetite, and interaction. She denies any SI/HI/AVH and contracts for safety. She agrees to follow up at [facility] as well as [facility] (see below). She is provided with prescriptions for medications upon discharge.    Physical Findings: AIMS: Facial and Oral Movements Muscles of Facial Expression: None, normal  Lips and Perioral Area: None, normal Jaw: None, normal Tongue: None, normal,Extremity Movements Upper (arms, wrists, hands, fingers): None, normal Lower (legs, knees, ankles, toes): None, normal, Trunk Movements Neck, shoulders, hips: None, normal, Overall Severity Severity of abnormal movements (highest score from questions above): None, normal Incapacitation due to abnormal movements: None, normal Patient's awareness of abnormal movements (rate only patient's report): No Awareness, Dental Status Current problems with teeth and/or dentures?: No Does patient usually wear dentures?: Yes  CIWA:  CIWA-Ar Total: 1 COWS:     Musculoskeletal: Strength & Muscle Tone: within normal limits Gait & Station: normal Patient leans: N/A  Psychiatric Specialty Exam: Physical Exam  Nursing note and vitals reviewed. Constitutional: She is oriented to person, place, and time. She appears well-developed and well-nourished.  Respiratory: Effort normal.  Neurological: She is  alert and oriented to person, place, and time.    Review of Systems  Constitutional: Negative.   Psychiatric/Behavioral: Positive for depression (improving) and memory loss. Negative for hallucinations, substance abuse and suicidal ideas. The patient is not nervous/anxious and does not have insomnia.     Blood pressure 102/69, pulse (!) 108, temperature 99.5 F (37.5 C), temperature source Oral, resp. rate 16, height 5\' 3"  (1.6 m), weight 60.3 kg.Body mass index is 23.56 kg/m.  See MD's discharge SRA     Have you used any form of tobacco in the last 30 days? (Cigarettes, Smokeless Tobacco, Cigars, and/or Pipes): No  Has this patient used any form of tobacco in the last 30 days? (Cigarettes, Smokeless Tobacco, Cigars, and/or Pipes)  No  Blood Alcohol level:  Lab Results  Component Value Date   ETH <10 08/09/2018   ETH <10 37/04/6268    Metabolic Disorder Labs:  Lab Results  Component Value Date   HGBA1C 5.7 (H) 08/11/2018   MPG 116.89 08/11/2018   MPG 105.41 05/25/2018   No results found for: PROLACTIN Lab Results  Component Value Date   CHOL 145 08/11/2018   TRIG 70 08/11/2018   HDL 88 08/11/2018   CHOLHDL 1.6 08/11/2018   VLDL 14 08/11/2018   LDLCALC 43 08/11/2018    See Psychiatric Specialty Exam and Suicide Risk Assessment completed by Attending Physician prior to discharge.  Discharge destination:  Home  Is patient on multiple antipsychotic therapies at discharge:  No   Has Patient had three or more failed trials of antipsychotic monotherapy by history:  No  Recommended Plan for Multiple Antipsychotic Therapies: NA  Discharge Instructions    Discharge instructions   Complete by:  As directed    Patient is instructed to take all prescribed medications as recommended. Report any side effects or adverse reactions to your outpatient psychiatrist. Patient is instructed to abstain from alcohol and illegal drugs while on prescription medications. In the event of  worsening symptoms, patient is instructed to call the crisis hotline, 911, or go to the nearest emergency department for evaluation and treatment.     Allergies as of 08/16/2018      Reactions   Contrast Media [iodinated Diagnostic Agents] Anaphylaxis, Hives   Only the retina dye;    Abilify [aripiprazole]    Pt dos not recall reaction   Ultram [tramadol Hcl]    Zithromax [azithromycin]    Anxiety heightened      Medication List    STOP taking these medications   hydrochlorothiazide 12.5 MG tablet Commonly known as:  HYDRODIURIL   lisinopril 10 MG tablet Commonly known as:  PRINIVIL,ZESTRIL   lithium carbonate 150  MG capsule   OLANZapine zydis 5 MG disintegrating tablet Commonly known as:  ZYPREXA   PARoxetine 40 MG tablet Commonly known as:  PAXIL   risperiDONE 1 MG tablet Commonly known as:  RISPERDAL Replaced by:  risperiDONE 0.5 MG disintegrating tablet     TAKE these medications     Indication  ACCU-CHEK GUIDE test strip Generic drug:  glucose blood CHECK BLOOD SUGAR ONCE A DAY  Indication:  High blood sugars   aspirin EC 81 MG tablet Take 1 tablet (81 mg total) by mouth daily.  Indication:  Antiplatelet   atorvastatin 40 MG tablet Commonly known as:  LIPITOR Take 1 tablet (40 mg total) by mouth daily.  Indication:  High Amount of Fats in the Blood   cetirizine 10 MG tablet Commonly known as:  ZYRTEC Take 1 tablet (10 mg total) by mouth daily as needed for allergies.  Indication:  Hayfever   escitalopram 20 MG tablet Commonly known as:  LEXAPRO Take 1 tablet (20 mg total) by mouth daily. For mood Start taking on:  August 17, 2018  Indication:  Mood   folic acid 1 MG tablet Commonly known as:  FOLVITE Take 1 mg by mouth daily.  Indication:  Methotrexate Poisoning   hydroxychloroquine 200 MG tablet Commonly known as:  PLAQUENIL Take 400 mg by mouth daily.  Indication:  Rheumatoid Arthritis   leflunomide 20 MG tablet Commonly known as:   ARAVA Take 20 mg by mouth daily.  Indication:  Rheumatoid Arthritis   methotrexate 15 MG tablet Commonly known as:  RHEUMATREX Take 1 tablet (15 mg total) by mouth once a week. Caution:Chemotherapy. Protect from light. Start taking on:  August 21, 2018  Indication:  Rheumatoid Arthritis   metoprolol tartrate 25 MG tablet Commonly known as:  LOPRESSOR Take 0.5 tablets (12.5 mg total) by mouth 2 (two) times daily. For high blood pressure  Indication:  High Blood Pressure Disorder   predniSONE 5 MG tablet Commonly known as:  DELTASONE Take 5 mg by mouth daily with breakfast.  Indication:  Rheumatoid Arthritis   risperidone 3 MG disintegrating tablet Commonly known as:  RISPERDAL M-TABS Take 1 tablet (3 mg total) by mouth at bedtime.  Indication:  Psychosis   risperiDONE 0.5 MG disintegrating tablet Commonly known as:  RISPERDAL M-TABS Take 1 tablet (0.5 mg total) by mouth daily. In the morning Start taking on:  August 17, 2018 Replaces:  risperiDONE 1 MG tablet  Indication:  Psychosis      Follow-up Information    [facility].   Specialty:  Behavioral Health Why:  Medication management appointment with [name] is 2/6 at 11:20a. Please bring your current medications and discharge paperwork from this hospitalization. Contact information: [address] [place] [address] [phone]        [facility] Follow up on 08/13/2018.   Why:  Referral made 1/30. CST will come do an assestment on Monday 2/3.  Contact information: [place] [address] [phone]           Follow-up recommendations: Activity as tolerated. Diet as recommended by primary care physician. Keep all scheduled follow-up appointments as recommended.   Comments:   Patient is instructed to take all prescribed medications as recommended. Report any side effects or adverse reactions to your outpatient  psychiatrist. Patient is instructed to abstain from alcohol and illegal drugs while on prescription medications. In the event of worsening symptoms,  patient is instructed to call the crisis hotline, 911, or go to the nearest emergency department for evaluation and treatment.  Signed: Connye Burkitt, NP 08/16/2018, 9:54 AM

## 2018-08-16 NOTE — Progress Notes (Signed)
  [facility] Adult Case Management Discharge Plan :  Will you be returning to the same living situation after discharge:  Yes,  patient reports she is returning to her independent living community, [facility] at discharge At discharge, do you have transportation home?: Yes,  patient reports her sister is picking her up at discharge  Do you have the ability to pay for your medications: Yes,  [facility] Medicare  Release of information consent forms completed and in the chart;  Patient's signature needed at discharge.  Patient to Follow up at: Follow-up Information    [facility].   Specialty:  Behavioral Health Why:  Medication management appointment with [name] is 2/6 at 11:20a. Please bring your current medications and discharge paperwork from this hospitalization. Contact information: [address] [place] [address] [phone]        [facility] Follow up on 08/13/2018.   Why:  Referral made 1/30. CST will come do an assestment on Monday 08/16/18. Please follow up within 3 days of discharge to inquire about community support team services once you discharge.  Contact information: [place] [address] [phone]           Next level of care provider has access to [place] and Suicide Prevention discussed: Yes,  with the patient's sister  Have you used any form of tobacco in the last 30 days? (Cigarettes, Smokeless Tobacco, Cigars, and/or Pipes): No  Has patient been referred to the Quitline?: N/A patient is not a smoker  Patient has been referred for addiction treatment: N/A  Marylee Floras, [place] 08/16/2018, 10:40 AM

## 2018-08-16 NOTE — Tx Team (Signed)
Interdisciplinary Treatment and Diagnostic Plan Update  08/16/2018 Time of Session: 9:40am [name] MRN: [id number]  Principal Diagnosis: MDD (major depressive disorder), recurrent episode, severe ([place])  Secondary Diagnoses: Principal Problem:   MDD (major depressive disorder), recurrent episode, severe ([place])   Current Medications:  Current Facility-Administered Medications  Medication Dose Route Frequency Provider Last Rate Last Dose  . acetaminophen (TYLENOL) tablet 650 mg  650 mg Oral Q6H PRN Suella Broad, FNP   650 mg at 08/15/18 2140  . alum & mag hydroxide-simeth (MAALOX/MYLANTA) 200-200-20 MG/5ML suspension 30 mL  30 mL Oral Q4H PRN Burt Ek, Gayland Curry, FNP      . aspirin EC tablet 81 mg  81 mg Oral Daily Suella Broad, FNP   81 mg at 08/16/18 4098  . atorvastatin (LIPITOR) tablet 40 mg  40 mg Oral Daily Suella Broad, FNP   40 mg at 08/16/18 1191  . escitalopram (LEXAPRO) tablet 20 mg  20 mg Oral Daily Suella Broad, FNP   20 mg at 08/16/18 4782  . feeding supplement (ENSURE ENLIVE) (ENSURE ENLIVE) liquid 237 mL  237 mL Oral BID BM Sharma Covert, MD   237 mL at 08/11/18 1600  . folic acid (FOLVITE) tablet 1 mg  1 mg Oral Daily Suella Broad, FNP   1 mg at 08/16/18 9562  . hydroxychloroquine (PLAQUENIL) tablet 400 mg  400 mg Oral Daily Suella Broad, FNP   400 mg at 08/16/18 1308  . leflunomide (ARAVA) tablet 20 mg  20 mg Oral Daily Suella Broad, FNP   20 mg at 08/16/18 6578  . loratadine (CLARITIN) tablet 10 mg  10 mg Oral Daily Suella Broad, FNP   10 mg at 08/16/18 4696  . magnesium hydroxide (MILK OF MAGNESIA) suspension 30 mL  30 mL Oral Daily PRN Starkes-Perry, Gayland Curry, FNP      . methotrexate (RHEUMATREX) tablet 15 mg  15 mg Oral Weekly Sharma Covert, MD   15 mg at 08/14/18 0809  . metoprolol tartrate (LOPRESSOR) tablet 12.5 mg  12.5 mg Oral BID Sharma Covert, MD   12.5 mg at  08/16/18 2952  . pneumococcal 23 valent vaccine (PNU-IMMUNE) injection 0.5 mL  0.5 mL Intramuscular Tomorrow-1000 Sharma Covert, MD      . predniSONE (DELTASONE) tablet 5 mg  5 mg Oral Q breakfast Suella Broad, FNP   5 mg at 08/16/18 8413  . risperiDONE (RISPERDAL M-TABS) disintegrating tablet 0.5 mg  0.5 mg Oral Daily Sharma Covert, MD   0.5 mg at 08/16/18 2440  . risperiDONE (RISPERDAL M-TABS) disintegrating tablet 3 mg  3 mg Oral QHS Sharma Covert, MD   3 mg at 08/15/18 2140   PTA Medications: Medications Prior to Admission  Medication Sig Dispense Refill Last Dose  . ACCU-CHEK GUIDE test strip CHECK BLOOD SUGAR ONCE A DAY  3 Taking  . folic acid (FOLVITE) 1 MG tablet Take 1 mg by mouth daily.    08/08/2018 at Unknown time  . hydrochlorothiazide (HYDRODIURIL) 12.5 MG tablet Take 12.5 mg by mouth every morning.   08/08/2018 at Unknown time  . hydroxychloroquine (PLAQUENIL) 200 MG tablet Take 400 mg by mouth daily.   08/08/2018 at Unknown time  . leflunomide (ARAVA) 20 MG tablet Take 20 mg by mouth daily.   08/08/2018 at Unknown time  . lisinopril (PRINIVIL,ZESTRIL) 10 MG tablet Take 10 mg by mouth daily.  1 08/08/2018 at Unknown time  .  lithium carbonate 150 MG capsule Take 1 capsule (150 mg total) by mouth daily. (Patient not taking: Reported on 08/09/2018) 30 capsule 0 Not Taking at Unknown time  . OLANZapine zydis (ZYPREXA) 5 MG disintegrating tablet Take 0.5 tablets (2.5 mg total) by mouth at bedtime. (Patient not taking: Reported on 08/09/2018) 30 tablet 2 Not Taking at Unknown time  . PARoxetine (PAXIL) 40 MG tablet 1/2 tab for a week, then stop (Patient not taking: Reported on 08/09/2018) 7 tablet 0 Not Taking at Unknown time  . predniSONE (DELTASONE) 5 MG tablet Take 5 mg by mouth daily with breakfast.   08/08/2018 at Unknown time  . risperiDONE (RISPERDAL) 1 MG tablet 1 pill a day for 3 days, then 2 a day (Patient not taking: Reported on 08/09/2018) 60 tablet 0 Not Taking  at Unknown time    Patient Stressors: Health problems Medication change or noncompliance  Patient Strengths: Ability for insight Average or above average intelligence Capable of independent living General fund of knowledge  Treatment Modalities: Medication Management, Group therapy, Case management,  1 to 1 session with clinician, Psychoeducation, Recreational therapy.   Physician Treatment Plan for Primary Diagnosis: MDD (major depressive disorder), recurrent episode, severe ([place]) Long Term Goal(s): Improvement in symptoms so as ready for discharge Improvement in symptoms so as ready for discharge   Short Term Goals: Ability to identify changes in lifestyle to reduce recurrence of condition will improve Ability to verbalize feelings will improve Ability to disclose and discuss suicidal ideas Ability to demonstrate self-control will improve Ability to identify and develop effective coping behaviors will improve Ability to maintain clinical measurements within normal limits will improve Compliance with prescribed medications will improve Ability to identify changes in lifestyle to reduce recurrence of condition will improve Ability to verbalize feelings will improve Ability to disclose and discuss suicidal ideas Ability to demonstrate self-control will improve Ability to identify and develop effective coping behaviors will improve Ability to maintain clinical measurements within normal limits will improve Compliance with prescribed medications will improve  Medication Management: Evaluate patient's response, side effects, and tolerance of medication regimen.  Therapeutic Interventions: 1 to 1 sessions, Unit Group sessions and Medication administration.  Evaluation of Outcomes: Adequate for Discharge  Physician Treatment Plan for Secondary Diagnosis: Principal Problem:   MDD (major depressive disorder), recurrent episode, severe ([place])  Long Term Goal(s): Improvement in  symptoms so as ready for discharge Improvement in symptoms so as ready for discharge   Short Term Goals: Ability to identify changes in lifestyle to reduce recurrence of condition will improve Ability to verbalize feelings will improve Ability to disclose and discuss suicidal ideas Ability to demonstrate self-control will improve Ability to identify and develop effective coping behaviors will improve Ability to maintain clinical measurements within normal limits will improve Compliance with prescribed medications will improve Ability to identify changes in lifestyle to reduce recurrence of condition will improve Ability to verbalize feelings will improve Ability to disclose and discuss suicidal ideas Ability to demonstrate self-control will improve Ability to identify and develop effective coping behaviors will improve Ability to maintain clinical measurements within normal limits will improve Compliance with prescribed medications will improve     Medication Management: Evaluate patient's response, side effects, and tolerance of medication regimen.  Therapeutic Interventions: 1 to 1 sessions, Unit Group sessions and Medication administration.  Evaluation of Outcomes: Adequate for Discharge   RN Treatment Plan for Primary Diagnosis: MDD (major depressive disorder), recurrent episode, severe ([place]) Long Term Goal(s): Knowledge of disease  and therapeutic regimen to maintain health will improve  Short Term Goals: Ability to participate in decision making will improve, Ability to verbalize feelings will improve, Ability to disclose and discuss suicidal ideas, Ability to identify and develop effective coping behaviors will improve and Compliance with prescribed medications will improve  Medication Management: RN will administer medications as ordered by provider, will assess and evaluate patient's response and provide education to patient for prescribed medication. RN will report any adverse  and/or side effects to prescribing provider.  Therapeutic Interventions: 1 on 1 counseling sessions, Psychoeducation, Medication administration, Evaluate responses to treatment, Monitor vital signs and CBGs as ordered, Perform/monitor CIWA, COWS, AIMS and Fall Risk screenings as ordered, Perform wound care treatments as ordered.  Evaluation of Outcomes: Adequate for Discharge   LCSW Treatment Plan for Primary Diagnosis: MDD (major depressive disorder), recurrent episode, severe ([place]) Long Term Goal(s): Safe transition to appropriate next level of care at discharge, Engage patient in therapeutic group addressing interpersonal concerns.  Short Term Goals: Engage patient in aftercare planning with referrals and resources  Therapeutic Interventions: Assess for all discharge needs, 1 to 1 time with Social worker, Explore available resources and support systems, Assess for adequacy in community support network, Educate family and significant other(s) on suicide prevention, Complete Psychosocial Assessment, Interpersonal group therapy.  Evaluation of Outcomes: Adequate for Discharge   Progress in Treatment: Attending groups: Yes. Participating in groups: Yes. Taking medication as prescribed: Yes. Toleration medication: Yes. Family/Significant other contact made: Yes, individual(s) contacted:  the patient's sister Patient understands diagnosis: Yes. Discussing patient identified problems/goals with staff: Yes. Medical problems stabilized or resolved: Yes. Denies suicidal/homicidal ideation: Yes. Issues/concerns per patient self-inventory: No. Other:   New problem(s) identified: None  New Short Term/Long Term Goal(s): medication stabilization, elimination of SI thoughts, development of comprehensive mental wellness plan.   Patient Goals:  "I want to discharge by Friday"  Discharge Plan or Barriers: Patient plans to discharge home alone, and plans to continue to follow up with her  medication management provider, Theodoro Kos, PA at [facility]. Patient states that she is not interested in therapy services at discharge at this time. Patient was referred to [facility] for additional services at discharge.    Reason for Continuation of Hospitalization: None   Estimated Length of Stay: Discharging, 08/16/2018  Attendees: Patient: 08/16/2018 11:08 AM  Physician: Dr. Myles Lipps, MD 08/16/2018 11:08 AM  Nursing: Elberta Fortis.A, RN; Legrand Como.Chauncey Cruel, RN 08/16/2018 11:08 AM  RN Care Manager: 08/16/2018 11:08 AM  Social Worker: Radonna Ricker, [place] 08/16/2018 11:08 AM  Recreational Therapist:  08/16/2018 11:08 AM  Other: Harriett Sine, NP  08/16/2018 11:08 AM  Other:  08/16/2018 11:08 AM  Other: 08/16/2018 11:08 AM    Scribe for Treatment Team: Marylee Floras, [place] 08/16/2018 11:08 AM

## 2018-08-16 NOTE — Plan of Care (Signed)
Discharge note  Patient verbalizes readiness for discharge. Follow up plan explained, AVS, Transition record and SRA given. Prescriptions and teaching provided. Belongings returned and signed for. Suicide safety plan completed and signed. Patient verbalizes understanding. Patient denies SI/HI and assures this writer she will seek assistance should that change. Patient discharged to lobby where family was waiting.  Problem: Education: Goal: Knowledge of [place] General Education information/materials will improve Outcome: Adequate for Discharge Goal: Emotional status will improve Outcome: Adequate for Discharge Goal: Mental status will improve Outcome: Adequate for Discharge Goal: Verbalization of understanding the information provided will improve Outcome: Adequate for Discharge   Problem: Activity: Goal: Interest or engagement in activities will improve Outcome: Adequate for Discharge Goal: Sleeping patterns will improve Outcome: Adequate for Discharge   Problem: Coping: Goal: Ability to verbalize frustrations and anger appropriately will improve Outcome: Adequate for Discharge Goal: Ability to demonstrate self-control will improve Outcome: Adequate for Discharge   Problem: Health Behavior/Discharge Planning: Goal: Identification of resources available to assist in meeting health care needs will improve Outcome: Adequate for Discharge Goal: Compliance with treatment plan for underlying cause of condition will improve Outcome: Adequate for Discharge   Problem: Physical Regulation: Goal: Ability to maintain clinical measurements within normal limits will improve Outcome: Adequate for Discharge   Problem: Safety: Goal: Periods of time without injury will increase Outcome: Adequate for Discharge   Problem: Education: Goal: Utilization of techniques to improve thought processes will improve Outcome: Adequate for Discharge Goal: Knowledge of the prescribed therapeutic  regimen will improve Outcome: Adequate for Discharge   Problem: Activity: Goal: Interest or engagement in leisure activities will improve Outcome: Adequate for Discharge Goal: Imbalance in normal sleep/wake cycle will improve Outcome: Adequate for Discharge   Problem: Coping: Goal: Coping ability will improve Outcome: Adequate for Discharge Goal: Will verbalize feelings Outcome: Adequate for Discharge   Problem: Health Behavior/Discharge Planning: Goal: Ability to make decisions will improve Outcome: Adequate for Discharge Goal: Compliance with therapeutic regimen will improve Outcome: Adequate for Discharge   Problem: Role Relationship: Goal: Will demonstrate positive changes in social behaviors and relationships Outcome: Adequate for Discharge   Problem: Safety: Goal: Ability to disclose and discuss suicidal ideas will improve Outcome: Adequate for Discharge Goal: Ability to identify and utilize support systems that promote safety will improve Outcome: Adequate for Discharge   Problem: Self-Concept: Goal: Will verbalize positive feelings about self Outcome: Adequate for Discharge Goal: Level of anxiety will decrease Outcome: Adequate for Discharge   Problem: Education: Goal: Ability to state activities that reduce stress will improve Outcome: Adequate for Discharge   Problem: Coping: Goal: Ability to identify and develop effective coping behavior will improve Outcome: Adequate for Discharge   Problem: Self-Concept: Goal: Ability to identify factors that promote anxiety will improve Outcome: Adequate for Discharge Goal: Level of anxiety will decrease Outcome: Adequate for Discharge Goal: Ability to modify response to factors that promote anxiety will improve Outcome: Adequate for Discharge   Problem: Education: Goal: Ability to make informed decisions regarding treatment will improve Outcome: Adequate for Discharge   Problem: Coping: Goal: Coping ability  will improve Outcome: Adequate for Discharge   Problem: Health Behavior/Discharge Planning: Goal: Identification of resources available to assist in meeting health care needs will improve Outcome: Adequate for Discharge   Problem: Medication: Goal: Compliance with prescribed medication regimen will improve Outcome: Adequate for Discharge   Problem: Self-Concept: Goal: Ability to disclose and discuss suicidal ideas will improve Outcome: Adequate for Discharge Goal: Will verbalize positive  feelings about self Outcome: Adequate for Discharge   Problem: Education: Goal: Knowledge of General Education information will improve Description Including pain rating scale, medication(s)/side effects and non-pharmacologic comfort measures Outcome: Adequate for Discharge   Problem: Health Behavior/Discharge Planning: Goal: Ability to manage health-related needs will improve Outcome: Adequate for Discharge   Problem: Coping: Goal: Level of anxiety will decrease Outcome: Adequate for Discharge   Problem: Safety: Goal: Ability to remain free from injury will improve Outcome: Adequate for Discharge

## 2018-08-19 ENCOUNTER — Ambulatory Visit: Payer: Medicare Other | Admitting: Psychiatry

## 2018-08-19 DIAGNOSIS — F333 Major depressive disorder, recurrent, severe with psychotic symptoms: Secondary | ICD-10-CM | POA: Diagnosis not present

## 2018-08-19 NOTE — Progress Notes (Signed)
[facility]  Patient ID: [name],  MRN: [id number]  PCP: Leighton Ruff, MD  Date of Evaluation: 08/19/2018 Time spent:25 minutes  Chief Complaint:   HISTORY/CURRENT STATUS: HPI patient was recently hospitalized for suicidal thoughts and plans and depression with psychosis he was hospitalized at [facility] from January 28 of this year to February 3.  He had a prior  involuntary commitment from 11/20 to 06/16/2018 at [facility] in [place] with major depressive disorder with psychosis and she had a suicide attempt using pills. Pt. had several visits and phone calls in January the patient continued to worsen.  The recent hospitalization she was put on Lexapro and Risperdal. She left the hospital she was feeling better and denied suicidal thoughts and hallucinations.  She was just discharged on 08/16/2018 (today is 2 6). Today she is complaining of back problems.  She also has some anxiety.  She denies manic thoughts.  They started her back on Lexapro and Risperdal at her hospitalization.  Individual Medical History/ Review of Systems: Changes? :No   Allergies: Contrast media [iodinated diagnostic agents]; Abilify [aripiprazole]; Ultram [tramadol hcl]; and Zithromax [azithromycin]  Current Medications:  Current Outpatient Medications:  .  ACCU-CHEK GUIDE test strip, CHECK BLOOD SUGAR ONCE A DAY, Disp: , Rfl: 3 .  aspirin EC 81 MG tablet, Take 1 tablet (81 mg total) by mouth daily., Disp: 1 tablet, Rfl: 0 .  atorvastatin (LIPITOR) 40 MG tablet, Take 1 tablet (40 mg total) by mouth daily., Disp: 1 tablet, Rfl: 0 .  cetirizine (ZYRTEC) 10 MG tablet, Take 1 tablet (10 mg total) by mouth daily as needed for allergies., Disp: 1 tablet, Rfl: 0 .  escitalopram (LEXAPRO) 20 MG tablet, Take 1 tablet (20 mg total) by mouth daily. For mood, Disp: 30 tablet, Rfl: 0 .  folic acid (FOLVITE) 1 MG tablet, Take 1 mg by mouth daily. , Disp: , Rfl:  .  hydroxychloroquine (PLAQUENIL) 200  MG tablet, Take 400 mg by mouth daily., Disp: , Rfl:  .  leflunomide (ARAVA) 20 MG tablet, Take 20 mg by mouth daily., Disp: , Rfl:  .  [START ON 08/21/2018] methotrexate (RHEUMATREX) 15 MG tablet, Take 1 tablet (15 mg total) by mouth once a week. Caution:Chemotherapy. Protect from light., Disp: 4 tablet, Rfl: 0 .  metoprolol tartrate (LOPRESSOR) 25 MG tablet, Take 0.5 tablets (12.5 mg total) by mouth 2 (two) times daily. For high blood pressure, Disp: 30 tablet, Rfl: 0 .  predniSONE (DELTASONE) 5 MG tablet, Take 5 mg by mouth daily with breakfast., Disp: , Rfl:  .  risperiDONE (RISPERDAL M-TABS) 0.5 MG disintegrating tablet, Take 1 tablet (0.5 mg total) by mouth daily. In the morning, Disp: 30 tablet, Rfl: 0 .  risperiDONE (RISPERDAL M-TABS) 3 MG disintegrating tablet, Take 1 tablet (3 mg total) by mouth at bedtime., Disp: 30 tablet, Rfl: 0 Medication Side Effects: none  Family Medical/ Social History: Changes? no  MENTAL HEALTH EXAM:  There were no vitals taken for this visit.There is no height or weight on file to calculate BMI.  General Appearance: Casual looks depressed.  Eye Contact:  Fair  Speech:  Clear and Coherent  Volume:  Normal  Mood:  Depressed  Affect:  Appropriate  Thought Process:  Linear  Orientation:  Full (Time, Place, and Person)  Thought Content: Logical   Suicidal Thoughts:  No  Homicidal Thoughts:  No  Memory:  WNL  Judgement:  Fair  Insight:  Fair  Psychomotor Activity:  Normal  Concentration:  Concentration: Good  Recall:  Good  Fund of Knowledge: Good  Language: Good  Assets:  Not much  ADL's:  Intact  Cognition: WNL  Prognosis:  Fair    DIAGNOSES: No diagnosis found.  Receiving Psychotherapy: No    RECOMMENDATIONS: The patient looks tired and sad.  I am not sure how much she trusts me now since I threatened to call for involuntary commitment.  I told her I would have to regain her trust.  She does denies suicidal thoughts but I am not sure how  truthful she will be with me.  She will continue on Lexapro 20 mg a day and Risperdal 3 mg in the evening and 0.5 mg in the morning.  She does commit to safety. I will see her again next week.  At that time we will talk about counseling.  She is to call if she is having any problems.  At her next visit we also discussed using a B complex and possibly fish oil.   Comer Locket, PA-C

## 2018-08-26 ENCOUNTER — Telehealth: Payer: Self-pay | Admitting: Psychiatry

## 2018-08-26 ENCOUNTER — Ambulatory Visit: Payer: Medicare Other | Admitting: Psychiatry

## 2018-08-26 NOTE — Telephone Encounter (Signed)
Pt. Cancelled appt. Today as not feeling well.' Called to check on pt. Not suicidal. Has made appt for later thjis month.

## 2018-08-27 ENCOUNTER — Other Ambulatory Visit: Payer: Self-pay | Admitting: Psychiatry

## 2018-09-04 ENCOUNTER — Other Ambulatory Visit: Payer: Self-pay | Admitting: Psychiatry

## 2018-09-06 NOTE — Telephone Encounter (Signed)
Has office visit tomorrow 02/25 verify M-tabs?

## 2018-09-07 ENCOUNTER — Ambulatory Visit: Payer: Medicare Other | Admitting: Psychiatry

## 2018-09-07 DIAGNOSIS — F333 Major depressive disorder, recurrent, severe with psychotic symptoms: Secondary | ICD-10-CM

## 2018-09-07 MED ORDER — RISPERIDONE 0.5 MG PO TBDP: 0.5000 mg | ORAL_TABLET | Freq: Every day | ORAL | 0 refills | Status: DC

## 2018-09-07 MED ORDER — RISPERIDONE 3 MG PO TABS: 3.0000 mg | ORAL_TABLET | Freq: Every day | ORAL | 0 refills | Status: DC

## 2018-09-07 NOTE — Progress Notes (Signed)
[facility]  Patient ID: [name],  MRN: [id number]  PCP: Leighton Ruff, MD  Date of Evaluation: 09/07/2018 Time spent:20 minutes  Chief Complaint:   HISTORY/CURRENT STATUS: HPI patient was seen 08/19/2018.  She was doing about the same at her last visit. Today she complains of feeling flat but no energy no motivation and lower back pain issues.  She denies depression. She desires any manic symptoms.  She has been taking Risperdal 0.5 mg in the evening and 3 mg in the morning she also continues to take Lexapro 20 mg a day.  She denies suicidal ideation.  Individual Medical History/ Review of Systems: Changes? :No   Allergies: Contrast media [iodinated diagnostic agents]; Abilify [aripiprazole]; Ultram [tramadol hcl]; and Zithromax [azithromycin]  Current Medications:  Current Outpatient Medications:  .  ACCU-CHEK GUIDE test strip, CHECK BLOOD SUGAR ONCE A DAY, Disp: , Rfl: 3 .  aspirin EC 81 MG tablet, Take 1 tablet (81 mg total) by mouth daily., Disp: 1 tablet, Rfl: 0 .  atorvastatin (LIPITOR) 40 MG tablet, Take 1 tablet (40 mg total) by mouth daily., Disp: 1 tablet, Rfl: 0 .  cetirizine (ZYRTEC) 10 MG tablet, Take 1 tablet (10 mg total) by mouth daily as needed for allergies., Disp: 1 tablet, Rfl: 0 .  escitalopram (LEXAPRO) 20 MG tablet, Take 1 tablet (20 mg total) by mouth daily. For mood, Disp: 30 tablet, Rfl: 0 .  folic acid (FOLVITE) 1 MG tablet, Take 1 mg by mouth daily. , Disp: , Rfl:  .  hydroxychloroquine (PLAQUENIL) 200 MG tablet, Take 400 mg by mouth daily., Disp: , Rfl:  .  leflunomide (ARAVA) 20 MG tablet, Take 20 mg by mouth daily., Disp: , Rfl:  .  methotrexate (RHEUMATREX) 15 MG tablet, Take 1 tablet (15 mg total) by mouth once a week. Caution:Chemotherapy. Protect from light., Disp: 4 tablet, Rfl: 0 .  metoprolol tartrate (LOPRESSOR) 25 MG tablet, Take 0.5 tablets (12.5 mg total) by mouth 2 (two) times daily. For high blood pressure, Disp: 30  tablet, Rfl: 0 .  predniSONE (DELTASONE) 5 MG tablet, Take 5 mg by mouth daily with breakfast., Disp: , Rfl:  .  risperiDONE (RISPERDAL) 3 MG tablet, TAKE 1 TABLET BY MOUTH AT BEDTIME, Disp: 30 tablet, Rfl: 0 Medication Side Effects: none  Family Medical/ Social History: Changes? No  MENTAL HEALTH EXAM:  There were no vitals taken for this visit.There is no height or weight on file to calculate BMI.  General Appearance: Casual  Eye Contact:  Fair  Speech:  Clear and Coherent  Volume:  Decreased  Mood:  Depressed  Affect:  Appropriate  Thought Process:  Linear  Orientation:  Full (Time, Place, and Person)  Thought Content: Logical   Suicidal Thoughts:  No  Homicidal Thoughts:  No  Memory:  WNL  Judgement:  Fair  Insight:  Fair  Psychomotor Activity:  Normal  Concentration:  Concentration: Good  Recall:  Good  Fund of Knowledge: Good  Language: Good  Assets:  Desire for Improvement  ADL's:  Intact  Cognition: WNL  Prognosis:  Fair    DIAGNOSES: No diagnosis found.  Receiving Psychotherapy: No    RECOMMENDATIONS: The patient will state will continue on Risperdal 3 mg at night and 1.5 mg in the morning, Lexapro 20 mg a day.  She denies being on lithium Zyprexa or Paxil.  Want to give her more time on the medication since she is only been out of the hospital 3 weeks  Comer Locket, PA-C

## 2018-09-09 ENCOUNTER — Telehealth: Payer: Self-pay | Admitting: Psychiatry

## 2018-09-09 NOTE — Telephone Encounter (Signed)
Called patient.  I asked her to move her appointment up in 1 week.  Also she is not on Klonopin.  At her next visit we will talk about counseling and had encouraged her today to B complex vitamins.

## 2018-09-14 ENCOUNTER — Telehealth: Payer: Self-pay | Admitting: Psychiatry

## 2018-09-14 NOTE — Telephone Encounter (Signed)
Pt got the Risperdal disintegrating pills 0.5mg  and wants to get the regular pills due to cost. Please send in the 0.5mg  Risperdal that she takes in the am to [facility] on file.

## 2018-09-15 ENCOUNTER — Other Ambulatory Visit: Payer: Self-pay | Admitting: Psychiatry

## 2018-09-15 MED ORDER — RISPERIDONE 0.5 MG PO TABS: ORAL_TABLET | ORAL | 1 refills | Status: DC

## 2018-09-21 ENCOUNTER — Ambulatory Visit: Payer: Medicare Other | Admitting: Psychiatry

## 2018-09-28 ENCOUNTER — Ambulatory Visit: Payer: Medicare Other | Admitting: Psychiatry

## 2018-09-29 ENCOUNTER — Ambulatory Visit: Payer: Medicare Other | Admitting: Psychiatry

## 2018-10-07 ENCOUNTER — Ambulatory Visit: Payer: Medicare Other | Admitting: Psychiatry

## 2018-10-12 ENCOUNTER — Ambulatory Visit (INDEPENDENT_AMBULATORY_CARE_PROVIDER_SITE_OTHER): Payer: Medicare Other | Admitting: Physician Assistant

## 2018-10-12 ENCOUNTER — Encounter: Payer: Self-pay | Admitting: Physician Assistant

## 2018-10-12 ENCOUNTER — Other Ambulatory Visit: Payer: Self-pay

## 2018-10-12 DIAGNOSIS — F331 Major depressive disorder, recurrent, moderate: Secondary | ICD-10-CM | POA: Diagnosis not present

## 2018-10-12 DIAGNOSIS — F411 Generalized anxiety disorder: Secondary | ICD-10-CM

## 2018-10-12 MED ORDER — RISPERIDONE 3 MG PO TABS: 3.0000 mg | ORAL_TABLET | Freq: Every day | ORAL | 1 refills | Status: DC

## 2018-10-12 MED ORDER — ESCITALOPRAM OXALATE 20 MG PO TABS: 20.0000 mg | ORAL_TABLET | Freq: Every day | ORAL | 0 refills | Status: DC

## 2018-10-12 MED ORDER — RISPERIDONE 0.5 MG PO TABS: ORAL_TABLET | ORAL | 1 refills | Status: DC

## 2018-10-12 NOTE — Progress Notes (Signed)
[facility]  Patient ID: [name],  MRN: [id number]  PCP: Leighton Ruff, MD  Date of Evaluation: 10/12/2018 Time spent:15 minutes  Chief Complaint:  Chief Complaint    Follow-up     Virtual Visit via Telephone Note  I connected with [name] on 10/12/18 at 11:30 AM EDT by telephone and verified that I am speaking with the correct person using two identifiers.   I discussed the limitations, risks, security and privacy concerns of performing an evaluation and management service by telephone and the availability of in person appointments. I also discussed with the patient that there may be a patient responsible charge related to this service. The patient expressed understanding and agreed to proceed.  Patient was unable to do a video visit.   HISTORY/CURRENT STATUS: HPI [name] is a patient of [facility]er Locket, [place] and is seen by me in his absence.  Patient states that she is feeling "okay.  It is hard to know how I am really doing because of everything that is going on in the world."  She is referring to the coronavirus pandemic in quarantine.  She does complain of low energy and motivation but that is a chronic problem.  She does have rheumatoid arthritis as well and that affects her energy level.  She does not really enjoy much but states that she is unable to get out and do things because of the quarantine as well as her own physical health problems prevent that.  She does not cry easily.  She states she sleeps well.  Denies suicidal or homicidal thoughts.  Patient denies increased energy with decreased need for sleep, no increased talkativeness, no racing thoughts, no impulsivity or risky behaviors, no increased spending, no increased libido, no grandiosity.  Denies hallucinations.  She does have a lot of joint pain and stiffness but is chronic from RA.  Denies tremors or tics.  No symptoms of akathisia.  Individual Medical History/ Review of  Systems: Changes? :No    Past medications for mental health diagnoses include: Unknown  Allergies: Contrast media [iodinated diagnostic agents]; Abilify [aripiprazole]; Ultram [tramadol hcl]; and Zithromax [azithromycin]  Current Medications:  Current Outpatient Medications:  .  ACCU-CHEK GUIDE test strip, CHECK BLOOD SUGAR ONCE A DAY, Disp: , Rfl: 3 .  aspirin EC 81 MG tablet, Take 1 tablet (81 mg total) by mouth daily., Disp: 1 tablet, Rfl: 0 .  atorvastatin (LIPITOR) 40 MG tablet, Take 1 tablet (40 mg total) by mouth daily., Disp: 1 tablet, Rfl: 0 .  escitalopram (LEXAPRO) 20 MG tablet, Take 1 tablet (20 mg total) by mouth daily. For mood, Disp: 30 tablet, Rfl: 0 .  folic acid (FOLVITE) 1 MG tablet, Take 1 mg by mouth daily. , Disp: , Rfl:  .  hydroxychloroquine (PLAQUENIL) 200 MG tablet, Take 400 mg by mouth daily., Disp: , Rfl:  .  leflunomide (ARAVA) 20 MG tablet, Take 20 mg by mouth daily., Disp: , Rfl:  .  methotrexate (RHEUMATREX) 15 MG tablet, Take 1 tablet (15 mg total) by mouth once a week. Caution:Chemotherapy. Protect from light., Disp: 4 tablet, Rfl: 0 .  metoprolol tartrate (LOPRESSOR) 25 MG tablet, Take 0.5 tablets (12.5 mg total) by mouth 2 (two) times daily. For high blood pressure, Disp: 30 tablet, Rfl: 0 .  predniSONE (DELTASONE) 5 MG tablet, Take 5 mg by mouth daily with breakfast., Disp: , Rfl:  .  risperiDONE (RISPERDAL) 0.5 MG tablet, Take 1 pill in am, Disp: 30  tablet, Rfl: 1 .  cetirizine (ZYRTEC) 10 MG tablet, Take 1 tablet (10 mg total) by mouth daily as needed for allergies. (Patient not taking: Reported on 10/12/2018), Disp: 1 tablet, Rfl: 0 .  risperiDONE (RISPERDAL) 3 MG tablet, Take 1 tablet (3 mg total) by mouth at bedtime., Disp: 30 tablet, Rfl: 1 Medication Side Effects: none  Family Medical/ Social History: Changes? No  MENTAL HEALTH EXAM:  There were no vitals taken for this visit.There is no height or weight on file to calculate BMI.  General  Appearance: Phone visit, unable to assess  Eye Contact:  Unable to assess  Speech:  Clear and Coherent  Volume:  Normal  Mood:  Euthymic  Affect:  Unable to assess  Thought Process:  Goal Directed  Orientation:  Full (Time, Place, and Person)  Thought Content: Logical   Suicidal Thoughts:  No  Homicidal Thoughts:  No  Memory:  WNL  Judgement:  Good  Insight:  Good  Psychomotor Activity:  Unable to assess  Concentration:  Concentration: Good  Recall:  Good  Fund of Knowledge: Good  Language: Good  Assets:  Desire for Improvement  ADL's:  Intact  Cognition: WNL  Prognosis:  Good    DIAGNOSES:    ICD-10-CM   1. Major depressive disorder, recurrent episode, moderate (HCC) F33.1   2. Generalized anxiety disorder F41.1     Receiving Psychotherapy: Unknown   RECOMMENDATIONS: Continue Lexapro 20 mg p.o. daily. Continue Risperdal 0.5 mg every morning and 3 mg nightly. We agreed that since there are so many uncertainties leading to anxiety secondary to the coronavirus pandemic, that changing medications right now may not be the best thing.  She will let me know however if her mental health changes and we may need to change medications appropriately. Return in 4 weeks.  Donnal Moat, PA-C   This record has been created using [employer].  Chart creation errors have been sought, but may not always have been located and corrected. Such creation errors do not reflect on the standard of medical care.

## 2018-10-15 DIAGNOSIS — M5136 Other intervertebral disc degeneration, lumbar region: Secondary | ICD-10-CM | POA: Insufficient documentation

## 2018-10-15 DIAGNOSIS — M51369 Other intervertebral disc degeneration, lumbar region without mention of lumbar back pain or lower extremity pain: Secondary | ICD-10-CM | POA: Insufficient documentation

## 2018-11-02 ENCOUNTER — Emergency Department (HOSPITAL_COMMUNITY): Payer: Medicare Other

## 2018-11-02 ENCOUNTER — Encounter (HOSPITAL_COMMUNITY): Payer: Self-pay | Admitting: Emergency Medicine

## 2018-11-02 ENCOUNTER — Other Ambulatory Visit: Payer: Self-pay

## 2018-11-02 ENCOUNTER — Emergency Department (HOSPITAL_COMMUNITY)
Admission: EM | Admit: 2018-11-02 | Discharge: 2018-11-04 | Disposition: A | Discharge status: Home / Self Care | Payer: Medicare Other | Attending: Emergency Medicine | Admitting: Emergency Medicine

## 2018-11-02 DIAGNOSIS — E860 Dehydration: Secondary | ICD-10-CM | POA: Insufficient documentation

## 2018-11-02 DIAGNOSIS — Z7982 Long term (current) use of aspirin: Secondary | ICD-10-CM | POA: Diagnosis not present

## 2018-11-02 DIAGNOSIS — F329 Major depressive disorder, single episode, unspecified: Secondary | ICD-10-CM | POA: Diagnosis not present

## 2018-11-02 DIAGNOSIS — Z79899 Other long term (current) drug therapy: Secondary | ICD-10-CM | POA: Diagnosis not present

## 2018-11-02 DIAGNOSIS — F419 Anxiety disorder, unspecified: Secondary | ICD-10-CM | POA: Diagnosis not present

## 2018-11-02 DIAGNOSIS — E039 Hypothyroidism, unspecified: Secondary | ICD-10-CM | POA: Diagnosis not present

## 2018-11-02 DIAGNOSIS — M6281 Muscle weakness (generalized): Secondary | ICD-10-CM | POA: Diagnosis not present

## 2018-11-02 DIAGNOSIS — I10 Essential (primary) hypertension: Secondary | ICD-10-CM | POA: Insufficient documentation

## 2018-11-02 DIAGNOSIS — Z9049 Acquired absence of other specified parts of digestive tract: Secondary | ICD-10-CM | POA: Insufficient documentation

## 2018-11-02 DIAGNOSIS — E119 Type 2 diabetes mellitus without complications: Secondary | ICD-10-CM | POA: Diagnosis not present

## 2018-11-02 DIAGNOSIS — R531 Weakness: Secondary | ICD-10-CM

## 2018-11-02 LAB — URINALYSIS, ROUTINE W REFLEX MICROSCOPIC
Bilirubin Urine: NEGATIVE
Glucose, UA: NEGATIVE mg/dL
Ketones, ur: 80 mg/dL — AB
Leukocytes,Ua: NEGATIVE
Nitrite: NEGATIVE
Protein, ur: 30 mg/dL — AB
Specific Gravity, Urine: 1.019 (ref 1.005–1.030)
pH: 6 (ref 5.0–8.0)

## 2018-11-02 LAB — CBC WITH DIFFERENTIAL/PLATELET
Abs Immature Granulocytes: 0.02 10*3/uL (ref 0.00–0.07)
Basophils Absolute: 0.1 10*3/uL (ref 0.0–0.1)
Basophils Relative: 1 %
Eosinophils Absolute: 0 10*3/uL (ref 0.0–0.5)
Eosinophils Relative: 0 %
HCT: 34.8 % — ABNORMAL LOW (ref 36.0–46.0)
Hemoglobin: 11.2 g/dL — ABNORMAL LOW (ref 12.0–15.0)
Immature Granulocytes: 0 %
Lymphocytes Relative: 3 %
Lymphs Abs: 0.3 10*3/uL — ABNORMAL LOW (ref 0.7–4.0)
MCH: 32.6 pg (ref 26.0–34.0)
MCHC: 32.2 g/dL (ref 30.0–36.0)
MCV: 101.2 fL — ABNORMAL HIGH (ref 80.0–100.0)
Monocytes Absolute: 0.9 10*3/uL (ref 0.1–1.0)
Monocytes Relative: 10 %
Neutro Abs: 7.6 10*3/uL (ref 1.7–7.7)
Neutrophils Relative %: 86 %
Platelets: 98 10*3/uL — ABNORMAL LOW (ref 150–400)
RBC: 3.44 MIL/uL — ABNORMAL LOW (ref 3.87–5.11)
RDW: 13.9 % (ref 11.5–15.5)
WBC: 8.8 10*3/uL (ref 4.0–10.5)
nRBC: 0 % (ref 0.0–0.2)

## 2018-11-02 LAB — BASIC METABOLIC PANEL
Anion gap: 12 (ref 5–15)
BUN: 18 mg/dL (ref 8–23)
CO2: 23 mmol/L (ref 22–32)
Calcium: 8.5 mg/dL — ABNORMAL LOW (ref 8.9–10.3)
Chloride: 105 mmol/L (ref 98–111)
Creatinine, Ser: 0.81 mg/dL (ref 0.44–1.00)
GFR calc Af Amer: >60 mL/min (ref >60)
GFR calc non Af Amer: >60 mL/min (ref >60)
Glucose, Bld: 97 mg/dL (ref 70–99)
Potassium: 3.2 mmol/L — ABNORMAL LOW (ref 3.5–5.1)
Sodium: 140 mmol/L (ref 135–145)

## 2018-11-02 LAB — HEPATIC FUNCTION PANEL
ALT: 32 U/L (ref 0–44)
AST: 80 U/L — ABNORMAL HIGH (ref 15–41)
Albumin: 3.5 g/dL (ref 3.5–5.0)
Alkaline Phosphatase: 50 U/L (ref 38–126)
Bilirubin, Direct: 0.3 mg/dL — ABNORMAL HIGH (ref 0.0–0.2)
Indirect Bilirubin: 0.8 mg/dL (ref 0.3–0.9)
Total Bilirubin: 1.1 mg/dL (ref 0.3–1.2)
Total Protein: 5.5 g/dL — ABNORMAL LOW (ref 6.5–8.1)

## 2018-11-02 LAB — TSH: TSH: 0.552 u[IU]/mL (ref 0.350–4.500)

## 2018-11-02 LAB — T4, FREE: Free T4: 0.93 ng/dL (ref 0.82–1.77)

## 2018-11-02 MED ORDER — HYDROXYCHLOROQUINE SULFATE 200 MG PO TABS
400.0000 mg | ORAL_TABLET | Freq: Every day | ORAL | Status: DC
  Administered 2018-11-02 – 2018-11-04 (×3): 400 mg via ORAL
  Filled 2018-11-02 (×4): qty 2

## 2018-11-02 MED ORDER — SODIUM CHLORIDE 0.9 % IV BOLUS
1000.0000 mL | Freq: Once | INTRAVENOUS | Status: AC
  Administered 2018-11-02: 06:00:00 1000 mL via INTRAVENOUS

## 2018-11-02 MED ORDER — ASPIRIN EC 81 MG PO TBEC
81.0000 mg | DELAYED_RELEASE_TABLET | Freq: Every day | ORAL | Status: DC
  Administered 2018-11-03 – 2018-11-04 (×2): 81 mg via ORAL
  Filled 2018-11-02 (×2): qty 1

## 2018-11-02 MED ORDER — METOPROLOL SUCCINATE ER 25 MG PO TB24
25.0000 mg | ORAL_TABLET | Freq: Every day | ORAL | Status: DC
  Administered 2018-11-02 – 2018-11-04 (×3): 25 mg via ORAL
  Filled 2018-11-02 (×4): qty 1

## 2018-11-02 MED ORDER — ATORVASTATIN CALCIUM 40 MG PO TABS
40.0000 mg | ORAL_TABLET | Freq: Every day | ORAL | Status: DC
  Administered 2018-11-02 – 2018-11-04 (×3): 40 mg via ORAL
  Filled 2018-11-02 (×4): qty 1

## 2018-11-02 MED ORDER — LORAZEPAM 2 MG/ML IJ SOLN
0.5000 mg | Freq: Once | INTRAMUSCULAR | Status: AC
  Administered 2018-11-02: 0.5 mg via INTRAVENOUS
  Filled 2018-11-02: qty 1

## 2018-11-02 MED ORDER — LEFLUNOMIDE 20 MG PO TABS
20.0000 mg | ORAL_TABLET | Freq: Every day | ORAL | Status: DC
  Administered 2018-11-02 – 2018-11-04 (×3): 20 mg via ORAL
  Filled 2018-11-02 (×4): qty 1

## 2018-11-02 MED ORDER — PREDNISONE 5 MG PO TABS
5.0000 mg | ORAL_TABLET | Freq: Every day | ORAL | Status: DC
  Administered 2018-11-03 – 2018-11-04 (×2): 5 mg via ORAL
  Filled 2018-11-02 (×3): qty 1

## 2018-11-02 MED ORDER — ALBUTEROL SULFATE HFA 108 (90 BASE) MCG/ACT IN AERS: 2.0000 | INHALATION_SPRAY | RESPIRATORY_TRACT | Status: DC | PRN

## 2018-11-02 NOTE — ED Notes (Signed)
Pt attempted to ambulate with 2 person assist and pt was not able to bare weight without support from assist. Pt only able to take small steps while holding on. Legs were noted to start shaking and pt stating "I can't walk."

## 2018-11-02 NOTE — Progress Notes (Addendum)
CSW received a call from Santiago Glad at [employer] who stated she would look at the pt's referral first thing on the morning of 4/22.  CSW will continue to follow for D/C needs.  Alphonse Guild. Manie Bealer, LCSW, LCAS, CSI Transitions of Care Clinical Social Worker Care Coordination Department Ph: [phone]

## 2018-11-02 NOTE — ED Triage Notes (Signed)
Pt presents from [facility] for evaluation of fall with increasing weakness in legs over the last several days. Denies any LOC. C-collar in place by EMS 18ga right wrist. Edema noted to left arm. Pt reports having increasing weakness over the last several days.

## 2018-11-02 NOTE — ED Notes (Signed)
Pt transported to MRI 

## 2018-11-02 NOTE — ED Notes (Signed)
Contact PT @ [phone]

## 2018-11-02 NOTE — ED Notes (Signed)
Pt ate all of her soup and drank a cup of water.

## 2018-11-02 NOTE — TOC Initial Note (Signed)
Transition of Care [facility]) - Initial/Assessment Note    Patient Details  Name: [name] MRN: [id number] Date of Birth: 08/09/1945  [facility]) CM/SW Contact:    Claudine Mouton, LCSW Phone Number: 11/02/2018, 7:15 PM  Clinical Narrative: CSW updated by EDP.  Pt still to have CT scans after refusing them earlier, per EPD.  Per EPD, pt is not likely to return safely to her ILF and will need placement.  PT recommends placement at this time, per the notes.  5:39 PM CSW spoke to pt who desires and is agreeable to SNF placement for short-term rehab and provided verbal permission for the CSW to create an FL-2 and refer pt out to a SNF at this time.  CSW explained to pt that due to time constraints in the ED multiple referral will be sent out to the [place] area and that pt will either have to go to the first available facilities willing to accept the pt or go home to be assisted with placement by [facility] social workers if placement is not found with a day or two from the ED and pt was agreeable to both.   CSW met with pt and confirmed pt's plan to be discharged to a SNF for short-term rehab at discharge.  CSW provided active listening and validated pt's concerns.   CSW will complete FL-2 and send referrals out to SNF facilities via the hub per pt's request.  Pt has been living independently at an ILF, prior to being admitted to [facility].                  Expected Discharge Plan: Skilled Nursing Facility Barriers to Discharge: No Barriers Identified   Patient Goals and CMS Choice Patient states their goals for this hospitalization and ongoing recovery are:: (Utilize rehab in order to be able to ambulate)      Expected Discharge Plan and Services Expected Discharge Plan: [place]                               Prior Living Arrangements/Services   Lives with:: Self Patient language and need for interpreter reviewed:: No Do you feel safe going  back to the place where you live?: No      Need for Family Participation in Patient Care: No (Comment) Care giver support system in place?: No (comment)      Activities of Daily Living      Permission Sought/Granted Permission sought to share information with : Facility Art therapist granted to share information with : Yes, Verbal Permission Granted              Emotional Assessment     Affect (typically observed): Accepting, Adaptable, Flat, Appropriate Orientation: : Oriented to Self, Oriented to Place, Oriented to  Time, Oriented to Situation      Admission diagnosis:  fall Patient Active Problem List   Diagnosis Date Noted  . MDD (major depressive disorder), recurrent episode, severe ([place]) 08/10/2018  . MDD (major depressive disorder), recurrent, severe, with psychosis ([place])   . Altered mental status 05/24/2018  . Ketonuria 05/24/2018  . Pain in right hand 09/11/2017  . Hypertension 03/07/2014  . Hyperlipidemia 03/07/2014  . Depression 03/07/2014  . 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 Type 2 diabetes mellitus without complication ([place]) 05/10/2535  . Rheumatoid arthritis ([place]) 02/24/2014  . Scoliosis of lumbar spine 02/13/2014  PCP:  Leighton Ruff, MD Pharmacy:   [id number] [id number]: [phone]: [phone]    Social Determinants of Health (SDOH) Interventions    Readmission Risk Interventions No flowsheet data found.

## 2018-11-02 NOTE — Progress Notes (Signed)
CSW called Winona Legato at [facility] at ph: [phone] and Santiago Glad at [facility] at ph: [phone].  CSW called Gayla Medicus at ph: [phone] who stated a COVID test woulld be necessary from the ED due to current SNF rules for [facility] and for [facility].  CSW also called Shelia at [facility] and left a message requesting a call back.  CSW will continue to follow for D/C needs.  Alphonse Guild. Cassady Stanczak, LCSW, LCAS, CSI Transitions of Care Clinical Social Worker Care Coordination Department Ph: [phone]

## 2018-11-02 NOTE — ED Provider Notes (Signed)
Blood pressure (!) 132/55, pulse 90, temperature 98.5 F (36.9 C), temperature source Rectal, resp. rate 16, height 5\' 3"  (1.6 m), weight 61.2 kg, SpO2 99 %.  Assuming care from Dr. Billy Fischer.  In short, [name] is a 73 y.o. female with a chief complaint of Fall .  Refer to the original H&P for additional details.  The current plan of care is to f/u on MRI and have case manager see the patient for placement. Patient labs and imaging to this point reviewed. Dr. Billy Fischer discussed with Neurology on call regarding plan. Plan for MRI and if negative will require placement in SNF. Patient has been seen by PT. Patient afebrile. UA negative. TSH negative. Patient refused MRI prior to my shift. I went in and explained the need for this test and offered ativan for nerves. Patient agrees to get MRI.   07:33 PM  MRI pending. FL2 signed. SW will send out offers to area SNFs.   11:00 PM  MRI reviewed.  Brain MRI unremarkable.  MRI of the lumbar spine status post PLIF. Moderate canal stenosis.  No cauda equina or abnormal cord signal.  Patient pending placement at this time.     Margette Fast, MD 11/02/18 [id number]

## 2018-11-02 NOTE — ED Notes (Signed)
Pt returned from CT scan.

## 2018-11-02 NOTE — ED Provider Notes (Signed)
[facility] DEPT Provider Note   CSN: [id number] Arrival date & time: 11/02/18  0319    History   Chief Complaint Chief Complaint  Patient presents with  . Fall    HPI [name] is a 73 y.o. female.     HPI  This is a 73 year old female with a history of depression, diabetes, hypertension who presents with a reported fall.  She presents via EMS.  She reports that she was bending over to pick something up when she fell backwards.  She does report hitting her head.  No loss of consciousness.  She reports generalized weakness in the bilateral lower extremities.  When asked the duration of her symptoms, she states "for a long time."  She denies any focal weakness or numbness or strokelike symptoms.  She denies any recent fevers, cough, chest pain, shortness breath, abdominal pain, nausea, vomiting.  She denies syncope.  Per EMS, temperature of 100.3.  Of note, patient is very stoic tearful.  When asked if there is anything bothering her or if she is in pain, she states "I guess I do not want to talk about that."  She denies any SI or HI.  She has a recent history of admission for major depressive disorder [facility] the [facility].  Past Medical History:  Diagnosis Date  . Anxiety   . Arthritis    rhematoid,osteoarthritis  . Diabetes mellitus without complication ([place])   . Elevated cholesterol   . Hemorrhoids   . Hypertension   . Hypothyroidism   . Joint pain   . Nocturia   . Sleeping difficulties     Patient Active Problem List   Diagnosis Date Noted  . MDD (major depressive disorder), recurrent episode, severe ([place]) 08/10/2018  . MDD (major depressive disorder), recurrent, severe, with psychosis ([place])   . Altered mental status 05/24/2018  . Ketonuria 05/24/2018  . Pain in right hand 09/11/2017  . Hypertension 03/07/2014  . Hyperlipidemia 03/07/2014  . Depression 03/07/2014  . Spinal stenosis, lumbar region, with  neurogenic claudication 02/24/2014  . Type 2 diabetes mellitus without complication ([place]) 78/29/5621  . Rheumatoid arthritis ([place]) 02/24/2014  . Scoliosis of lumbar spine 02/13/2014    Past Surgical History:  Procedure Laterality Date  . ABDOMINAL HYSTERECTOMY    . BACK SURGERY    . CERVICAL FUSION  2004  . CHOLECYSTECTOMY    . EYE SURGERY Left    cataract  . PARS PLANA REPAIR OF RETINAL DEATACHMENT    . THYROIDECTOMY, PARTIAL       OB History   No obstetric history on file.      Home Medications    Prior to Admission medications   Medication Sig Start Date End Date Taking? Authorizing Provider  albuterol (VENTOLIN HFA) 108 (90 Base) MCG/ACT inhaler Inhale 2 puffs into the lungs every 4 (four) hours as needed for wheezing or shortness of breath.   Yes [provider]  aspirin EC 81 MG tablet Take 1 tablet (81 mg total) by mouth daily. 08/16/18  Yes Connye Burkitt, NP  atorvastatin (LIPITOR) 40 MG tablet Take 1 tablet (40 mg total) by mouth daily. 08/16/18  Yes Connye Burkitt, NP  Cholecalciferol (VITAMIN D) 50 MCG (2000 UT) tablet Take 2,000 Units by mouth daily.   Yes [provider]  fluticasone (FLONASE) 50 MCG/ACT nasal spray Place 1 spray into both nostrils daily as needed for allergies or rhinitis.    Yes [provider]  folic acid (FOLVITE) 1 MG tablet Take 1 mg by mouth daily.    Yes [provider]  hydroxychloroquine (PLAQUENIL) 200 MG tablet Take 400 mg by mouth daily.   Yes [provider]  leflunomide (ARAVA) 20 MG tablet Take 20 mg by mouth daily.   Yes [provider]  metoprolol succinate (TOPROL-XL) 25 MG 24 hr tablet Take 25 mg by mouth daily.   Yes [provider]  predniSONE (DELTASONE) 5 MG tablet Take 5 mg by mouth daily with breakfast.   Yes [provider]  ACCU-CHEK GUIDE test strip CHECK BLOOD SUGAR ONCE A DAY 10/29/17   [provider]  cetirizine (ZYRTEC) 10 MG tablet Take 1  tablet (10 mg total) by mouth daily as needed for allergies. Patient not taking: Reported on 10/12/2018 08/16/18   Connye Burkitt, NP  escitalopram (LEXAPRO) 20 MG tablet Take 1 tablet (20 mg total) by mouth daily. For mood Patient not taking: Reported on 11/02/2018 10/12/18   Donnal Moat T, PA-C  methotrexate (RHEUMATREX) 15 MG tablet Take 1 tablet (15 mg total) by mouth once a week. Caution:Chemotherapy. Protect from light. Patient not taking: Reported on 11/02/2018 08/21/18   Connye Burkitt, NP  metoprolol tartrate (LOPRESSOR) 25 MG tablet Take 0.5 tablets (12.5 mg total) by mouth 2 (two) times daily. For high blood pressure Patient not taking: Reported on 11/02/2018 08/16/18   Connye Burkitt, NP  risperiDONE (RISPERDAL) 0.5 MG tablet Take 1 pill in am Patient not taking: Reported on 11/02/2018 10/12/18   Donnal Moat T, PA-C  risperiDONE (RISPERDAL) 3 MG tablet Take 1 tablet (3 mg total) by mouth at bedtime. Patient not taking: Reported on 11/02/2018 10/12/18   Alen Blew    Family History Family History  Problem Relation Age of Onset  . Diabetes Mother   . Breast cancer Mother   . Gout Mother   . Alzheimer's disease Father   . Heart Problems Father   . Breast cancer Sister   . Alzheimer's disease Sister   . Diabetes Sister   . Gout Sister   . Heart Problems Sister   . Alzheimer's disease Brother   . Diabetes Brother   . Heart Problems Brother     Social History Social History   Tobacco Use  . Smoking status: Never Smoker  . Smokeless tobacco: Never Used  Substance Use Topics  . Alcohol use: No  . Drug use: No     Allergies   Contrast media [iodinated diagnostic agents]; Abilify [aripiprazole]; Ultram [tramadol hcl]; and Zithromax [azithromycin]   Review of Systems Review of Systems  Constitutional: Positive for fever.  Respiratory: Negative for shortness of breath.   Cardiovascular: Negative for chest pain.  Gastrointestinal: Negative for abdominal pain,  nausea and vomiting.  Genitourinary: Negative for dysuria.  Musculoskeletal: Negative for back pain and neck pain.  Neurological: Positive for weakness. Negative for syncope and headaches.  All other systems reviewed and are negative.    Physical Exam Updated Vital Signs BP (!) 123/46   Pulse (!) 102   Temp 98.5 F (36.9 C) (Rectal)   Resp 11   Ht 1.6 m (5\' 3" )   Wt 61.2 kg   SpO2 96%   BMI 23.91 kg/m   Physical Exam Vitals signs and nursing note reviewed.  Constitutional:      Appearance: She is well-developed.     Comments: Elderly, stoic, flat affect  HENT:     Head: Normocephalic and atraumatic.  Mouth/Throat:     Mouth: Mucous membranes are moist.  Eyes:     Pupils: Pupils are equal, round, and reactive to light.  Neck:     Comments: C-collar in place Cardiovascular:     Rate and Rhythm: Normal rate and regular rhythm.  Pulmonary:     Effort: Pulmonary effort is normal. No respiratory distress.  Abdominal:     Palpations: Abdomen is soft. There is no mass.     Tenderness: There is no abdominal tenderness.  Musculoskeletal:        General: No deformity or signs of injury.  Skin:    General: Skin is warm and dry.  Neurological:     Mental Status: She is alert and oriented to person, place, and time.     Comments: 5 out of 5 strength in all 4 extremities, no dysmetria to finger-nose-finger  Psychiatric:     Comments: Flat affect      ED Treatments / Results  Labs (all labs ordered are listed, but only abnormal results are displayed) Labs Reviewed  URINALYSIS, ROUTINE W REFLEX MICROSCOPIC - Abnormal; Notable for the following components:      Result Value   APPearance HAZY (*)    Hgb urine dipstick MODERATE (*)    Ketones, ur 80 (*)    Protein, ur 30 (*)    Bacteria, UA RARE (*)    All other components within normal limits  CBC WITH DIFFERENTIAL/PLATELET - Abnormal; Notable for the following components:   RBC 3.44 (*)    Hemoglobin 11.2 (*)     HCT 34.8 (*)    MCV 101.2 (*)    Platelets 98 (*)    Lymphs Abs 0.3 (*)    All other components within normal limits  BASIC METABOLIC PANEL - Abnormal; Notable for the following components:   Potassium 3.2 (*)    Calcium 8.5 (*)    All other components within normal limits    EKG EKG Interpretation  Date/Time:  Tuesday November 02 2018 03:51:28 EDT Ventricular Rate:  102 PR Interval:    QRS Duration: 75 QT Interval:  377 QTC Calculation: 492 R Axis:   46 Text Interpretation:  Sinus tachycardia Borderline prolonged QT interval Confirmed by Thayer Jew [id number]) on 11/02/2018 4:39:12 AM   Radiology Ct Head Wo Contrast  Result Date: 11/02/2018 CLINICAL DATA:  73 year old female with increasing weakness and falls. EXAM: CT HEAD WITHOUT CONTRAST CT CERVICAL SPINE WITHOUT CONTRAST TECHNIQUE: Multidetector CT imaging of the head and cervical spine was performed following the standard protocol without intravenous contrast. Multiplanar CT image reconstructions of the cervical spine were also generated. COMPARISON:  Brain MRI 06/02/2018 and earlier. FINDINGS: CT HEAD FINDINGS Brain: Cerebral volume is within normal limits for age. Mild for age scattered cerebral white matter hypodensity appears stable to white matter changes on the 2019 MRI. No midline shift, ventriculomegaly, mass effect, evidence of mass lesion, intracranial hemorrhage or evidence of cortically based acute infarction. No cortical encephalomalacia identified. Vascular: Calcified atherosclerosis at the skull base. Skull: Stable and intact. Sinuses/Orbits: Visualized paranasal sinuses and mastoids are stable and well pneumatized. Other: Right superior convexity scalp hematoma measures up to 6 millimeters in thickness on series 4, image 40. Underlying calvarium intact. Smaller left posterior scalp hematoma measures 3 millimeters on image 31. Stable scalp elsewhere. Stable orbits. CT CERVICAL SPINE FINDINGS Alignment: Straightening of  cervical lordosis. The head is flexed to the left on these images. Bilateral posterior element alignment is within normal limits. Maintained  C1-C2 alignment. Skull base and vertebrae: Visualized skull base is intact. No atlanto-occipital dissociation. No acute osseous abnormality identified. Soft tissues and spinal canal: No prevertebral fluid or swelling. No visible canal hematoma. Lobulated and partially calcified right thyroid lobe enlargement, up to 6.5 centimeters long axis. Surgical clips at the left thyroid bed. Disc levels: Prior cervical ACDF from the C4 to the C7 level. Solid arthrodesis. Possible developing posterior element ankylosis also at C3-C4. No hardware loosening. Upper chest: Visible upper thoracic levels appear intact. There is T1-T2 endplate and facet degeneration. Negative visible noncontrast superior mediastinum. Negative visible lung apices. IMPRESSION: 1. Scalp hematomas without underlying skull fracture. 2. No other acute traumatic injury identified in the head or cervical spine. 3. Stable non contrast CT appearance of the brain. 4. Previous cervical ACDF with solid arthrodesis. 5. Previous left hemithyroidectomy and 6.5 cm right thyroid goiter. Electronically Signed   By: Genevie Ann M.D.   On: 11/02/2018 04:41   Ct Cervical Spine Wo Contrast  Result Date: 11/02/2018 CLINICAL DATA:  73 year old female with increasing weakness and falls. EXAM: CT HEAD WITHOUT CONTRAST CT CERVICAL SPINE WITHOUT CONTRAST TECHNIQUE: Multidetector CT imaging of the head and cervical spine was performed following the standard protocol without intravenous contrast. Multiplanar CT image reconstructions of the cervical spine were also generated. COMPARISON:  Brain MRI 06/02/2018 and earlier. FINDINGS: CT HEAD FINDINGS Brain: Cerebral volume is within normal limits for age. Mild for age scattered cerebral white matter hypodensity appears stable to white matter changes on the 2019 MRI. No midline shift,  ventriculomegaly, mass effect, evidence of mass lesion, intracranial hemorrhage or evidence of cortically based acute infarction. No cortical encephalomalacia identified. Vascular: Calcified atherosclerosis at the skull base. Skull: Stable and intact. Sinuses/Orbits: Visualized paranasal sinuses and mastoids are stable and well pneumatized. Other: Right superior convexity scalp hematoma measures up to 6 millimeters in thickness on series 4, image 40. Underlying calvarium intact. Smaller left posterior scalp hematoma measures 3 millimeters on image 31. Stable scalp elsewhere. Stable orbits. CT CERVICAL SPINE FINDINGS Alignment: Straightening of cervical lordosis. The head is flexed to the left on these images. Bilateral posterior element alignment is within normal limits. Maintained C1-C2 alignment. Skull base and vertebrae: Visualized skull base is intact. No atlanto-occipital dissociation. No acute osseous abnormality identified. Soft tissues and spinal canal: No prevertebral fluid or swelling. No visible canal hematoma. Lobulated and partially calcified right thyroid lobe enlargement, up to 6.5 centimeters long axis. Surgical clips at the left thyroid bed. Disc levels: Prior cervical ACDF from the C4 to the C7 level. Solid arthrodesis. Possible developing posterior element ankylosis also at C3-C4. No hardware loosening. Upper chest: Visible upper thoracic levels appear intact. There is T1-T2 endplate and facet degeneration. Negative visible noncontrast superior mediastinum. Negative visible lung apices. IMPRESSION: 1. Scalp hematomas without underlying skull fracture. 2. No other acute traumatic injury identified in the head or cervical spine. 3. Stable non contrast CT appearance of the brain. 4. Previous cervical ACDF with solid arthrodesis. 5. Previous left hemithyroidectomy and 6.5 cm right thyroid goiter. Electronically Signed   By: Genevie Ann M.D.   On: 11/02/2018 04:41    Procedures Procedures (including  critical care time)  Medications Ordered in ED Medications  sodium chloride 0.9 % bolus 1,000 mL (1,000 mLs Intravenous New Bag/Given 11/02/18 0557)     Initial Impression / Assessment and Plan / ED Course  I have reviewed the triage vital signs and the nursing notes.  Pertinent labs & imaging results  that were available during my care of the patient were reviewed by me and considered in my medical decision making (see chart for details).  Clinical Course as of Nov 02 615  Tue Nov 02, 2018  0542 Nursing attempted to ambulate the patient.  She was very weak with 2 person assist; however, thought to be effort related.  On reassessment, I asked the patient multiple times how long she has had weakness and whether or not she has been able to get around at her independent care facility.  She just states "not good."  She appears to have intact strength of the bilateral lower extremities and no focal deficit.   [CH]  0551 Urinalysis without evidence of urinary tract infection.  She does have 80 ketones in the urine which may be suggestive of dehydration.  Fluids and orthostatics were ordered.   [CH]    Clinical Course User Index [CH] Horton, Barbette Hair, MD       Patient presents after a fall.  She is very stoic and will not provide much history.  No obvious injuries on exam.  CT head neck largely reassuring.  EKG shows no evidence of arrhythmia or ischemia.  Urinalysis without evidence of infection.  However it does have 80 ketones and may reflect some dehydration.  See clinical course above.  Patient seems very flat and did not participate fully in attempts to ambulate.  She lives at independent living facility.  For this reason, we will have physical therapy evaluate her this morning.  I suspect she may have ongoing and/or worsening depression given her general affect.  However, feel that physical therapy evaluation needs to be addressed first.  Final Clinical Impressions(s) / ED Diagnoses    Final diagnoses:  Dehydration  Generalized weakness    ED Discharge Orders    None       Horton, Barbette Hair, MD 11/02/18 [phone]

## 2018-11-02 NOTE — ED Notes (Signed)
Pt refusing MRI

## 2018-11-02 NOTE — ED Notes (Signed)
Patient transported to CT 

## 2018-11-02 NOTE — ED Notes (Signed)
Patient assisted to bedside commode, and back to bed. Patient very unsteady on her feet.

## 2018-11-02 NOTE — Progress Notes (Signed)
Per  MUST, pt has a PASRR:  [id number]

## 2018-11-02 NOTE — ED Notes (Signed)
Pt tolerated PO meds well. She states that she usually has trouble swallowing her pills, which is why she quit taking them, but pt had no issues tonight with this RN.

## 2018-11-02 NOTE — ED Notes (Signed)
Bed: QZ83 Expected date:  Expected time:  Means of arrival:  Comments: EMS 73 yo female from SNF after fall-generalized wakness-temp 100.8

## 2018-11-02 NOTE — ED Notes (Signed)
Pt up to bedpan with 2 person assist. Pt had BM and urinated. Cleaned and helped back to bed.

## 2018-11-02 NOTE — Progress Notes (Signed)
Pt came to MRI for MRI Brain and MRI L spine exams. Pt was confused as to why she was requiring additional test besides the CT scans she received earlier today. This was explained to pt. Pt exclaimed she was nervous ; I reassured pt she was never left alone, explained test in detail to pt. I did offer to call ER for antianxiety meds. Pt states she rather not have the exams. Called Ronalee Belts RN prior to returning pt to the ER. Please let us know if pt changes her mind and we can be of further assistance to this patient.  Lynelle Doctor [phone]

## 2018-11-02 NOTE — ED Notes (Signed)
Pt given tomato soup

## 2018-11-02 NOTE — ED Notes (Signed)
Dr. Horton at bedside at this time.  

## 2018-11-02 NOTE — ED Notes (Signed)
C-collar removed by Dr.Horton.

## 2018-11-02 NOTE — ED Notes (Signed)
Social Work has been called

## 2018-11-02 NOTE — Progress Notes (Addendum)
Consult request has been received. CSW attempting to follow up at present time.  CSW updated by EDP.  Pt still to have CT scans after refusing them earlier, per EPD.  Per EPD, pt is not likely to return safely to her ILF and will need placement.  PT recommends placement at this time, per the notes.  5:39 PM CSW spoke to pt who desires and is agreeable to SNF placement for short-term rehab and provided verbal permission for the CSW to create an FL-2 and refer pt out to a SNF at this time.  CSW explained to pt that due to time constraints in the ED multiple referral will be sent out to the [place] area and that pt will either have to go to the first available facilities willing to accept the pt or go home to be assisted with placement by [facility] social workers if placement is not found with a day or two from the ED and pt was agreeable to both.   CSW will continue to follow for D/C needs.  Alphonse Guild. Annett Boxwell, LCSW, LCAS, CSI Transitions of Care Clinical Social Worker Care Coordination Department Ph: [phone]

## 2018-11-02 NOTE — Evaluation (Addendum)
Physical Therapy Evaluation Patient Details Name: [name] MRN: [id number] DOB: January 29, 1946 Today's Date: 11/02/2018   History of Present Illness  73 year old female with a history of depression, diabetes, hypertension who presents with a reported fall. CT head negative. Pt had admission to [facility] for depression, SI, paranoia in January 2020.   Clinical Impression  Pt admitted with above diagnosis. Pt currently with functional limitations due to the deficits listed below (see PT Problem List). Pt requires +2 assist to transfer from bed to bedside commode. She is a very high fall risk. 24 hour assistance recommended at SNF level. Pt lives alone at an ILF and does not have 24* assistance available there. Pt has flat affect and is a vague historian. She reports she's had difficulty walking for several months and mostly was staying in her chair prior to admission. She denied falls other than the one just prior to this admission.  Edema and skin tear noted LUE, RN notified.  Pt will benefit from skilled PT to increase their independence and safety with mobility to allow discharge to the venue listed below.       Follow Up Recommendations SNF;Supervision/Assistance - 24 hour;Supervision for mobility/OOB    Equipment Recommendations  Wheelchair (measurements PT);Wheelchair cushion (measurements PT);Rolling walker with 5" wheels    Recommendations for Other Services   OT consult    Precautions / Restrictions Precautions Precautions: Fall Precaution Comments: pt fell just prior to admission, denies other falls in past 1 year      Mobility  Bed Mobility Overal bed mobility: Needs Assistance Bed Mobility: Supine to Sit     Supine to sit: Mod assist     General bed mobility comments: assist to raise trunk and pivot hips to edge of bed  Transfers Overall transfer level: Needs assistance Equipment used: Rolling walker (2 wheeled) Transfers: Sit to/from [facility] Sit  to Stand: Mod assist;+2 physical assistance;+2 safety/equipment Stand pivot transfers: Mod assist;+2 physical assistance;+2 safety/equipment       General transfer comment: assist to rise/steady, stand pivot from bed to bedside commode then to bed, VCs for hand placement  Ambulation/Gait             General Gait Details: NT-pt weak/unsteady with stand pivot transfer  Stairs            Wheelchair Mobility    Modified Rankin (Stroke Patients Only)       Balance Overall balance assessment: Needs assistance Sitting-balance support: Feet supported Sitting balance-Leahy Scale: Fair     Standing balance support: Bilateral upper extremity supported Standing balance-Leahy Scale: Poor Standing balance comment: relies on BUE support                             Pertinent Vitals/Pain Pain Assessment: 0-10 Pain Score: 8  Pain Location: BLEs at rest, pt stated pain is in entire leg Pain Descriptors / Indicators: Sore Pain Intervention(s): Limited activity within patient's tolerance;Monitored during session;Premedicated before session    Home Living Family/patient expects to be discharged to:: Private residence Living Arrangements: Alone Available Help at Discharge: Family;Available PRN/intermittently Type of Home: Independent living facility Home Access: Level entry     Home Layout: One level Home Equipment: Cane - quad;Shower seat Additional Comments: pt lives in [place], niece brings groceries, she does not have 20* assistance available     Prior Function Level of Independence: Independent with assistive device(s)  Hand Dominance   Dominant Hand: Right    Extremity/Trunk Assessment   Upper Extremity Assessment Upper Extremity Assessment: Defer to OT evaluation(edema noted L upper forearm, pt stated this is chronic and is due to rheumatoid arthritis; pt unable to elevate shoulders above 45* actively in supine, will place OT consult.  Skin tear noted posterior L forearm, RN notified)    Lower Extremity Assessment Lower Extremity Assessment: RLE deficits/detail;LLE deficits/detail RLE Deficits / Details: knee ext +2/5, knee ext AROM -30*, reports tingling with light touch B feet, ankle PF/DF +2/5 RLE Sensation: decreased light touch LLE Deficits / Details: knee ext +2/5, knee ext AROM -30*, reports tingling with light touch B feet, ankle PF/DF +2/5 LLE Sensation: decreased light touch    Cervical / Trunk Assessment Cervical / Trunk Assessment: Normal  Communication   Communication: No difficulties  Cognition Arousal/Alertness: Awake/alert Behavior During Therapy: WFL for tasks assessed/performed;Flat affect Overall Cognitive Status: Within Functional Limits for tasks assessed                                 General Comments: vague historian, very flat affect      General Comments      Exercises  Heel slides x10 B AAROM supine Ankle pumps x 10 B AAROM supine Hip ABDuction x 5 B AAROM supine   Assessment/Plan    PT Assessment Patient needs continued PT services  PT Problem List Decreased strength;Decreased balance;Decreased mobility;Decreased activity tolerance;Pain;Decreased knowledge of use of DME       PT Treatment Interventions DME instruction;Gait training;Therapeutic exercise;Therapeutic activities;Functional mobility training;Balance training;Patient/family education    PT Goals (Current goals can be found in the Care Plan section)  Acute Rehab PT Goals Patient Stated Goal: to be able to walk, return home PT Goal Formulation: With patient Time For Goal Achievement: 11/16/18 Potential to Achieve Goals: Fair    Frequency Min 2X/week   Barriers to discharge        Co-evaluation               AM-PAC PT "6 Clicks" Mobility  Outcome Measure Help needed turning from your back to your side while in a flat bed without using bedrails?: A Little Help needed moving from lying  on your back to sitting on the side of a flat bed without using bedrails?: A Lot Help needed moving to and from a bed to a chair (including a wheelchair)?: A Lot Help needed standing up from a chair using your arms (e.g., wheelchair or bedside chair)?: A Lot Help needed to walk in hospital room?: Total Help needed climbing 3-5 steps with a railing? : Total 6 Click Score: 11    End of Session Equipment Utilized During Treatment: Gait belt Activity Tolerance: Patient limited by fatigue Patient left: in bed;with call bell/phone within reach Nurse Communication: Mobility status PT Visit Diagnosis: Difficulty in walking, not elsewhere classified (R26.2);Muscle weakness (generalized) (M62.81);Pain Pain - Right/Left: Right(both) Pain - part of body: Hip;Knee;Leg    Time: 0539-7673 PT Time Calculation (min) (ACUTE ONLY): 23 min   Charges:   PT Evaluation $PT Eval Moderate Complexity: 1 Mod PT Treatments $Therapeutic Activity: 8-22 mins        Blondell Reveal Kistler PT 11/02/2018  Acute Rehabilitation Services Pager [phone] Office [phone]

## 2018-11-02 NOTE — NC FL2 (Signed)
[place] LEVEL OF CARE SCREENING TOOL     IDENTIFICATION  Patient Name: [name] Birthdate: 1945-08-12 Sex: female Admission Date (Current Location): 11/02/2018  [facility] and [place] Number:  Herbalist and Address:  [facility],  [place] [address] [place]      Provider Number: [id number]  Attending Physician Name and Address:  Margette Fast, MD  Relative Name and Phone Number:       Current Level of Care: Hospital Recommended Level of Care: [place] Prior Approval Number:    Date Approved/Denied: 02/15/14 PASRR Number: [id number]  Discharge Plan: SNF    Current Diagnoses: Patient Active Problem List   Diagnosis Date Noted  . MDD (major depressive disorder), recurrent episode, severe ([place]) 08/10/2018  . MDD (major depressive disorder), recurrent, severe, with psychosis ([place])   . Altered mental status 05/24/2018  . Ketonuria 05/24/2018  . Pain in right hand 09/11/2017  . Hypertension 03/07/2014  . Hyperlipidemia 03/07/2014  . Depression 03/07/2014  . 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 Type 2 diabetes mellitus without complication ([place]) 34/19/3790  . Rheumatoid arthritis ([place]) 02/24/2014  . Scoliosis of lumbar spine 02/13/2014    Orientation RESPIRATION BLADDER Height & Weight     Self, Time, Situation, Place  Normal Continent Weight: 135 lb (61.2 kg) Height:  5\' 3"  (160 cm)  BEHAVIORAL SYMPTOMS/MOOD NEUROLOGICAL BOWEL NUTRITION STATUS      Continent Diet(Regular)  AMBULATORY STATUS COMMUNICATION OF NEEDS Skin   Limited Assist Verbally Normal                       Personal Care Assistance Level of Assistance  Bathing, Dressing Bathing Assistance: Limited assistance   Dressing Assistance: Limited assistance     Functional Limitations Info             SPECIAL CARE FACTORS FREQUENCY  PT (By licensed PT), OT (By licensed OT)     PT  Frequency: 5 OT Frequency: 5            Contractures Contractures Info: Not present    Additional Factors Info  Code Status, Allergies Code Status Info: FULL CODE Allergies Info: Contrast Media Iodinated Diagnostic Agents, Abilify Aripiprazole, Ultram Tramadol Hcl, Zithromax Azithromycin           Current Medications (11/02/2018):  This is the current hospital active medication list Current Facility-Administered Medications  Medication Dose Route Frequency Provider Last Rate Last Dose  . LORazepam (ATIVAN) injection 0.5 mg  0.5 mg Intravenous Once Long, Wonda Olds, MD       Current Outpatient Medications  Medication Sig Dispense Refill  . albuterol (VENTOLIN HFA) 108 (90 Base) MCG/ACT inhaler Inhale 2 puffs into the lungs every 4 (four) hours as needed for wheezing or shortness of breath.    Marland Kitchen aspirin EC 81 MG tablet Take 1 tablet (81 mg total) by mouth daily. 1 tablet 0  . atorvastatin (LIPITOR) 40 MG tablet Take 1 tablet (40 mg total) by mouth daily. 1 tablet 0  . Cholecalciferol (VITAMIN D) 50 MCG (2000 UT) tablet Take 2,000 Units by mouth daily.    . fluticasone (FLONASE) 50 MCG/ACT nasal spray Place 1 spray into both nostrils daily as needed for allergies or rhinitis.     . folic acid (FOLVITE) 1 MG tablet Take 1 mg by mouth daily.     . hydroxychloroquine (PLAQUENIL) 200 MG tablet Take 400 mg by  mouth daily.    Marland Kitchen leflunomide (ARAVA) 20 MG tablet Take 20 mg by mouth daily.    . metoprolol succinate (TOPROL-XL) 25 MG 24 hr tablet Take 25 mg by mouth daily.    . predniSONE (DELTASONE) 5 MG tablet Take 5 mg by mouth daily with breakfast.    . ACCU-CHEK GUIDE test strip CHECK BLOOD SUGAR ONCE A DAY  3  . cetirizine (ZYRTEC) 10 MG tablet Take 1 tablet (10 mg total) by mouth daily as needed for allergies. (Patient not taking: Reported on 10/12/2018) 1 tablet 0  . escitalopram (LEXAPRO) 20 MG tablet Take 1 tablet (20 mg total) by mouth daily. For mood (Patient not taking: Reported on  11/02/2018) 30 tablet 0  . methotrexate (RHEUMATREX) 15 MG tablet Take 1 tablet (15 mg total) by mouth once a week. Caution:Chemotherapy. Protect from light. (Patient not taking: Reported on 11/02/2018) 4 tablet 0  . metoprolol tartrate (LOPRESSOR) 25 MG tablet Take 0.5 tablets (12.5 mg total) by mouth 2 (two) times daily. For high blood pressure (Patient not taking: Reported on 11/02/2018) 30 tablet 0  . risperiDONE (RISPERDAL) 0.5 MG tablet Take 1 pill in am (Patient not taking: Reported on 11/02/2018) 30 tablet 1  . risperiDONE (RISPERDAL) 3 MG tablet Take 1 tablet (3 mg total) by mouth at bedtime. (Patient not taking: Reported on 11/02/2018) 30 tablet 1     Discharge Medications: Please see discharge summary for a list of discharge medications.  Relevant Imaging Results:  Relevant Lab Results:   Additional Information [id number]    Alphonse Guild Antonyo Hinderer, LCSW

## 2018-11-03 NOTE — Evaluation (Signed)
Occupational Therapy Evaluation Patient Details Name: [name] MRN: [id number] DOB: 1946-01-12 Today's Date: 11/03/2018    History of Present Illness 73 year old female with a history of depression, diabetes, hypertension who presents with a reported fall. CT head negative. Pt had admission to [facility] for depression, SI, paranoia in January 2020.    Clinical Impression   Pt was admitted for the above.  She lived in independent living and did the best that she could. She was unable to perform IADLs, barely got meals together and did what she could for adls.  Pt will benefit from continued OT.  Goals in acute are for min A. She needs up to total A +2 for LB adls and mod +2 for mobility    Follow Up Recommendations  SNF    Equipment Recommendations  3 in 1 bedside commode    Recommendations for Other Services       Precautions / Restrictions Precautions Precautions: Fall Precaution Comments: pt fell just prior to admission, denies other falls in past 1 year      Mobility Bed Mobility               General bed mobility comments: min A to roll and for sidelying to sit; mod +2 back to bed; assist for bil LEs  Transfers   Equipment used: Rolling walker (2 wheeled)   Sit to Stand: Mod assist;+2 physical assistance;+2 safety/equipment Stand pivot transfers: Mod assist;+2 physical assistance;+2 safety/equipment       General transfer comment: assist to rise and stabilize. Pt leaned to L and posteriorly    Balance                                           ADL either performed or assessed with clinical judgement   ADL Overall ADL's : Needs assistance/impaired Eating/Feeding: Set up   Grooming: Set up   Upper Body Bathing: Minimal assistance   Lower Body Bathing: Maximal assistance;+2 for safety/equipment   Upper Body Dressing : Moderate assistance   Lower Body Dressing: Total assistance;+2 for safety/equipment   Toilet Transfer: Moderate  assistance;+2 for safety/equipment;BSC;RW   Toileting- Clothing Manipulation and Hygiene: Total assistance;+2 for safety/equipment;Sit to/from stand         General ADL Comments: used BSC then back to bed. Assist to open items on tray     Vision         Perception     Praxis      Pertinent Vitals/Pain Pain Assessment: Faces Faces Pain Scale: Hurts little more Pain Location: all over Pain Descriptors / Indicators: Sore Pain Intervention(s): Limited activity within patient's tolerance;Monitored during session;Repositioned     Hand Dominance     Extremity/Trunk Assessment Upper Extremity Assessment Upper Extremity Assessment: Generalized weakness(LUE limited to 60; AAROM to 80)           Communication Communication Communication: No difficulties   Cognition Arousal/Alertness: Awake/alert Behavior During Therapy: WFL for tasks assessed/performed;Flat affect Overall Cognitive Status: Within Functional Limits for tasks assessed                                     General Comments       Exercises     Shoulder Instructions      Home Living Family/patient expects to be discharged to:: Skilled  nursing facility Living Arrangements: Alone                                      Prior Functioning/Environment Level of Independence: Independent with assistive device(s)        Comments: has a cane; was doing the best she could with adls; lives in independent living at [place]. They do not do meals        OT Problem List: Decreased strength;Decreased activity tolerance;Impaired balance (sitting and/or standing);Decreased safety awareness;Decreased knowledge of use of DME or AE;Pain      OT Treatment/Interventions: Self-care/ADL training;Energy conservation;DME and/or AE instruction;Therapeutic exercise;Patient/family education;Balance training;Therapeutic activities    OT Goals(Current goals can be found in the care plan section)  Acute Rehab OT Goals Patient Stated Goal: to be able to walk, return home OT Goal Formulation: With patient Time For Goal Achievement: 11/17/18 Potential to Achieve Goals: Good ADL Goals Pt Will Transfer to Toilet: with min assist;bedside commode;stand pivot transfer Pt Will Perform Toileting - Clothing Manipulation and hygiene: with mod assist;sit to/from stand;sitting/lateral leans Additional ADL Goal #1: pt will perform UB adls with set up and LB adls with min A, using AE as needed  OT Frequency: Min 2X/week   Barriers to D/C:            Co-evaluation              AM-PAC OT "6 Clicks" Daily Activity     Outcome Measure Help from another person eating meals?: A Little Help from another person taking care of personal grooming?: A Little Help from another person toileting, which includes using toliet, bedpan, or urinal?: A Lot Help from another person bathing (including washing, rinsing, drying)?: A Lot Help from another person to put on and taking off regular upper body clothing?: A Lot Help from another person to put on and taking off regular lower body clothing?: Total 6 Click Score: 13   End of Session    Activity Tolerance: Patient tolerated treatment well Patient left: in bed;with call bell/phone within reach  OT Visit Diagnosis: Muscle weakness (generalized) (M62.81);Unsteadiness on feet (R26.81);Other abnormalities of gait and mobility (R26.89);History of falling (Z91.81)                Time: 8527-7824 OT Time Calculation (min): 17 min Charges:  OT General Charges $OT Visit: 1 Visit OT Evaluation $OT Eval Moderate Complexity: [place], OTR/L Acute Rehabilitation Services [phone] [facility] pager [phone] office 11/03/2018  [place] 11/03/2018, 10:13 AM

## 2018-11-03 NOTE — ED Notes (Signed)
Patient is anxious, repeating that she doesn't have her insurance cards and will not be accepted at the facility.

## 2018-11-03 NOTE — ED Provider Notes (Signed)
Received care at [facility] from Dr. Dina Rich. Please see her note for prior Hx and physical. Briefly, this is a 73yo female who presented with fall from independent care facility.  UA shows some dehydration, patient with psychiatric history and flat affect.  Patient given fluids and PT worked with her and recommends SNF placement.   She has continued weakness after fluids.  Evaluated her and she was noted to have severe generalized weakness worse in lower extremities however present in upper extremities. She is unable to properly particiate in parts of the neuro exam like finger to nose, with difficulty completing this unclear if due to difficulty coordinating, difficulty lifting arms/weakness or poor effort due to other generalized disorder.  Reports her "legs aren't listening to her."  Reflexes are difficult to obtain as she will not relax her legs.  She reports these symptoms have been ongoing for the last month but worsened in the last day resulting in her fall and inability to walk independently.  Discussed with Neurology, and we have low suspicion for GBS given above timeline and exam.  Will obtain MRI to evaluate for possible posterior circulation stroke and given pt with hx of spinal stenosis and lower back pain will obtain lumbar MRI. If no acute pathology requiring admission, she will require placement into a SNF and outpatient Neurology follow up for continued evaluation of her weakness.    Care signed out with MRI pending, if no acute pathology will place Social work/case management consult given patient lives independently and requires SNF level due to weakness and falls.    Gareth Morgan, MD 11/03/18 [phone]

## 2018-11-03 NOTE — Progress Notes (Signed)
Physical Therapy Treatment Patient Details Name: [name] MRN: [id number] DOB: [birth year] Today's Date: 11/03/2018    History of Present Illness 73 year old female with a history of depression, diabetes, hypertension who presents with a reported fall. CT head negative. Pt had admission to [facility] for depression, SI, paranoia in January 2020.     PT Comments    Pt continues cooperative but with marked weakness and very limited endurance.  Pt would benefit from follow up rehab at SNF level to maximize IND and safety prior to return home.  Follow Up Recommendations  SNF;Supervision/Assistance - 24 hour;Supervision for mobility/OOB     Equipment Recommendations  Wheelchair (measurements PT);Wheelchair cushion (measurements PT);Rolling walker with 5" wheels    Recommendations for Other Services OT consult     Precautions / Restrictions Precautions Precautions: Fall Precaution Comments: pt fell just prior to admission, denies other falls in past 1 year    Mobility  Bed Mobility Overal bed mobility: Needs Assistance Bed Mobility: Supine to Sit;Sit to Supine     Supine to sit: Min assist;+2 for physical assistance Sit to supine: Mod assist;+2 for physical assistance;+2 for safety/equipment   General bed mobility comments: min A to roll and for sidelying to sit; mod +2 back to bed; assist for bil LEs  Transfers Overall transfer level: Needs assistance Equipment used: Rolling walker (2 wheeled) Transfers: Sit to/from [facility] Sit to Stand: Mod assist;+2 physical assistance;+2 safety/equipment Stand pivot transfers: Mod assist;+2 physical assistance;+2 safety/equipment       General transfer comment: assist to rise and stabilize. Pt leaned to L and posteriorly  Ambulation/Gait             General Gait Details: NT-pt weak/unsteady with stand pivot transfer   Stairs             Wheelchair Mobility    Modified Rankin (Stroke Patients  Only)       Balance Overall balance assessment: Needs assistance Sitting-balance support: Feet supported Sitting balance-Leahy Scale: Fair     Standing balance support: Bilateral upper extremity supported Standing balance-Leahy Scale: Poor Standing balance comment: relies on BUE support                            Cognition Arousal/Alertness: Awake/alert Behavior During Therapy: WFL for tasks assessed/performed;Flat affect Overall Cognitive Status: Within Functional Limits for tasks assessed                                        Exercises      General Comments        Pertinent Vitals/Pain Pain Assessment: Faces Faces Pain Scale: Hurts little more Pain Location: all over Pain Descriptors / Indicators: Sore Pain Intervention(s): Limited activity within patient's tolerance;Monitored during session    Home Living                      Prior Function Level of Independence: Independent with assistive device(s)      Comments: has a cane; was doing the best she could with adls; lives in independent living at [place]. They do not do meals   PT Goals (current goals can now be found in the care plan section) Acute Rehab PT Goals Patient Stated Goal: to be able to walk, return home PT Goal Formulation: With patient Time For Goal Achievement: 11/16/18 Potential to  Achieve Goals: Fair Progress towards PT goals: Progressing toward goals    Frequency    Min 2X/week      PT Plan Current plan remains appropriate    Co-evaluation              AM-PAC PT "6 Clicks" Mobility   Outcome Measure  Help needed turning from your back to your side while in a flat bed without using bedrails?: A Little Help needed moving from lying on your back to sitting on the side of a flat bed without using bedrails?: A Lot Help needed moving to and from a bed to a chair (including a wheelchair)?: A Lot Help needed standing up from a chair using your  arms (e.g., wheelchair or bedside chair)?: A Lot Help needed to walk in hospital room?: A Lot Help needed climbing 3-5 steps with a railing? : Total 6 Click Score: 12    End of Session Equipment Utilized During Treatment: Gait belt Activity Tolerance: Patient limited by fatigue Patient left: in bed;with call bell/phone within reach Nurse Communication: Mobility status PT Visit Diagnosis: Difficulty in walking, not elsewhere classified (R26.2);Muscle weakness (generalized) (M62.81);Pain Pain - Right/Left: Right Pain - part of body: Hip;Knee;Leg     Time: 7425-9563 PT Time Calculation (min) (ACUTE ONLY): 25 min  Charges:  $Therapeutic Activity: 8-22 mins                     Debe Coder PT Acute Rehabilitation Services Pager [phone] Office [phone]    Royce Sciara 11/03/2018, 12:42 PM

## 2018-11-03 NOTE — Progress Notes (Addendum)
Update 3:30AM: CSW is still waiting on determination from [facility] SNF to see if they are able to accept patient from ED. Will continue to follow up.  CSW spoke with patient via phone- patient agreeable to [facility]. Facility is able to accept patient today pending review of vitals.   Will continue to update  Kingsley Spittle, LCSW [facility] [place]  [phone]

## 2018-11-03 NOTE — ED Notes (Signed)
Bed: YC14 Expected date:  Expected time:  Means of arrival:  Comments: Rm 9

## 2018-11-03 NOTE — Progress Notes (Addendum)
2nd shift ED CSW received a handoff from the 1st shift [facility] ED CSW.  Due to pt not warranting a COVID test, per the EPD,the facility that is considering pt for admission [facility]) would like pt to remain another night for observation in the ED to insure pt's temp remains stable (see below).  2nd shift ED CSW spoke with Santiago Glad at [facility] in admissions at ph: [phone] who stated her "corporate admins " would feel better if pt could remain in the ED overnight to insure pt's temperature will stay steady" and if pt's temp does remain stable then pt's vitals will need to be faxed to Santiago Glad at [facility] SNF on 11/04/18 at: [phone] for possible admittance to [facility].  CSW will continue to follow for D/C needs.  Alphonse Guild. Joella Saefong, LCSW, LCAS, CSI Transitions of Care Clinical Social Worker Care Coordination Department Ph: [phone]

## 2018-11-03 NOTE — Discharge Planning (Signed)
Clinical Social Work is seeking post-discharge placement for this patient at the following level of care: SNF.    

## 2018-11-04 NOTE — ED Notes (Signed)
Pt provided breakfast tray.

## 2018-11-04 NOTE — Progress Notes (Addendum)
10:45am: CSW confirmed with Santiago Glad that they are able to take pt. CSW will fax over AVS once MD has completed  it. CSW updated RN as well as MD of pt's ability to go to facility. Facility has asked that pt not be transported to facility until AVS has been received. CSW will document once AVS has been sent. CSW also updated pt's sister [name] of placement at this time.   CSW followed up with Santiago Glad from [facility] and was advised that they are still looking over pt however would likely take pt. CSW spoke with MD to ask for AVS at this time to send over to facility. CSW awaiting AVS to fax over to facility.      Virgie Dad Mame Twombly, MSW, [place] [facility] Clinical Social Worker [phone]

## 2018-11-04 NOTE — ED Provider Notes (Signed)
Pt will be going to a SNF.    Jola Schmidt, MD 11/04/18 1102

## 2018-11-04 NOTE — ED Notes (Signed)
MSW reports patient has gotten placement at [facility]. Call report to [phone].

## 2018-11-04 NOTE — ED Notes (Signed)
Pt awake, resting comfortably, talking on the phone.  Pt updated as to plan of care.  Call bell within reach, will continue to monitor.

## 2018-11-04 NOTE — Progress Notes (Signed)
CSW has faxed over AVS to facility at this time. RN to call transport once pt is ready. CSW will sign off as there are no further CSW needs.       Virgie Dad Marlet Korte, MSW, [place] [facility] Clinical Social Worker [phone]

## 2018-11-04 NOTE — ED Notes (Signed)
Pts vital signs as of 0830 today.  BP- 175/89 HR- 87 Resp- 16 SpO2- 97% on room air.  Temp- 98.3 F (oral)

## 2018-11-04 NOTE — Progress Notes (Signed)
CSW sent patient's vitals to Santiago Glad at [facility] for review.  Madilyn Fireman, MSW, [place] Clinical Social Worker [employer] [phone]

## 2018-11-04 NOTE — Discharge Instructions (Signed)
Please follow up with your family doctor.  Return to the ED for worsening weakness, fever, back pain

## 2018-11-09 ENCOUNTER — Telehealth: Payer: Self-pay | Admitting: Physician Assistant

## 2018-11-09 ENCOUNTER — Ambulatory Visit: Payer: Medicare Other | Admitting: Physician Assistant

## 2018-11-09 ENCOUNTER — Other Ambulatory Visit: Payer: Self-pay | Admitting: Physician Assistant

## 2018-11-09 ENCOUNTER — Other Ambulatory Visit: Payer: Self-pay

## 2018-11-09 NOTE — Telephone Encounter (Signed)
I called patient 3 different times between 11:00 and 11:15, to have her appointment over the phone.  Her voicemail is full so I was never able to connect with her or leave a message.

## 2018-11-22 ENCOUNTER — Encounter (HOSPITAL_COMMUNITY): Payer: Self-pay | Admitting: Emergency Medicine

## 2018-11-22 ENCOUNTER — Other Ambulatory Visit: Payer: Self-pay

## 2018-11-22 ENCOUNTER — Emergency Department (HOSPITAL_COMMUNITY)
Admission: EM | Admit: 2018-11-22 | Discharge: 2018-11-24 | Disposition: A | Discharge status: Skilled Nursing Facility | Payer: Medicare Other | Attending: Emergency Medicine | Admitting: Emergency Medicine

## 2018-11-22 DIAGNOSIS — E039 Hypothyroidism, unspecified: Secondary | ICD-10-CM | POA: Diagnosis not present

## 2018-11-22 DIAGNOSIS — R531 Weakness: Secondary | ICD-10-CM

## 2018-11-22 DIAGNOSIS — Z7982 Long term (current) use of aspirin: Secondary | ICD-10-CM | POA: Insufficient documentation

## 2018-11-22 DIAGNOSIS — N390 Urinary tract infection, site not specified: Secondary | ICD-10-CM | POA: Insufficient documentation

## 2018-11-22 DIAGNOSIS — Z79899 Other long term (current) drug therapy: Secondary | ICD-10-CM | POA: Insufficient documentation

## 2018-11-22 DIAGNOSIS — Z1159 Encounter for screening for other viral diseases: Secondary | ICD-10-CM | POA: Insufficient documentation

## 2018-11-22 DIAGNOSIS — B964 Proteus (mirabilis) (morganii) as the cause of diseases classified elsewhere: Secondary | ICD-10-CM | POA: Insufficient documentation

## 2018-11-22 DIAGNOSIS — M069 Rheumatoid arthritis, unspecified: Secondary | ICD-10-CM | POA: Insufficient documentation

## 2018-11-22 DIAGNOSIS — E119 Type 2 diabetes mellitus without complications: Secondary | ICD-10-CM | POA: Insufficient documentation

## 2018-11-22 DIAGNOSIS — I10 Essential (primary) hypertension: Secondary | ICD-10-CM | POA: Insufficient documentation

## 2018-11-22 LAB — URINALYSIS, ROUTINE W REFLEX MICROSCOPIC
Bilirubin Urine: NEGATIVE
Glucose, UA: NEGATIVE mg/dL
Hgb urine dipstick: NEGATIVE
Ketones, ur: 20 mg/dL — AB
Nitrite: NEGATIVE
Protein, ur: NEGATIVE mg/dL
Specific Gravity, Urine: 1.024 (ref 1.005–1.030)
pH: 5 (ref 5.0–8.0)

## 2018-11-22 LAB — BASIC METABOLIC PANEL
Anion gap: 13 (ref 5–15)
BUN: 24 mg/dL — ABNORMAL HIGH (ref 8–23)
CO2: 30 mmol/L (ref 22–32)
Calcium: 8.8 mg/dL — ABNORMAL LOW (ref 8.9–10.3)
Chloride: 99 mmol/L (ref 98–111)
Creatinine, Ser: 0.64 mg/dL (ref 0.44–1.00)
GFR calc Af Amer: >60 mL/min (ref >60)
GFR calc non Af Amer: >60 mL/min (ref >60)
Glucose, Bld: 88 mg/dL (ref 70–99)
Potassium: 3 mmol/L — ABNORMAL LOW (ref 3.5–5.1)
Sodium: 142 mmol/L (ref 135–145)

## 2018-11-22 LAB — CBC
HCT: 36 % (ref 36.0–46.0)
Hemoglobin: 11.6 g/dL — ABNORMAL LOW (ref 12.0–15.0)
MCH: 33 pg (ref 26.0–34.0)
MCHC: 32.2 g/dL (ref 30.0–36.0)
MCV: 102.6 fL — ABNORMAL HIGH (ref 80.0–100.0)
Platelets: 99 10*3/uL — ABNORMAL LOW (ref 150–400)
RBC: 3.51 MIL/uL — ABNORMAL LOW (ref 3.87–5.11)
RDW: 13.7 % (ref 11.5–15.5)
WBC: 5 10*3/uL (ref 4.0–10.5)
nRBC: 0 % (ref 0.0–0.2)

## 2018-11-22 MED ORDER — POTASSIUM CHLORIDE CRYS ER 20 MEQ PO TBCR
40.0000 meq | EXTENDED_RELEASE_TABLET | Freq: Once | ORAL | Status: AC
  Administered 2018-11-22: 40 meq via ORAL
  Filled 2018-11-22: qty 2

## 2018-11-22 MED ORDER — ASPIRIN EC 81 MG PO TBEC
81.0000 mg | DELAYED_RELEASE_TABLET | Freq: Every day | ORAL | Status: DC
  Administered 2018-11-22 – 2018-11-24 (×3): 81 mg via ORAL
  Filled 2018-11-22 (×3): qty 1

## 2018-11-22 MED ORDER — SODIUM CHLORIDE 0.9 % IV BOLUS
500.0000 mL | Freq: Once | INTRAVENOUS | Status: AC
  Administered 2018-11-22: 500 mL via INTRAVENOUS

## 2018-11-22 MED ORDER — SODIUM CHLORIDE 0.9 % IV BOLUS: 1000.0000 mL | Freq: Once | INTRAVENOUS | Status: DC

## 2018-11-22 MED ORDER — ESCITALOPRAM OXALATE 10 MG PO TABS
20.0000 mg | ORAL_TABLET | Freq: Every day | ORAL | Status: DC
  Administered 2018-11-22 – 2018-11-24 (×3): 20 mg via ORAL
  Filled 2018-11-22 (×3): qty 2

## 2018-11-22 MED ORDER — METOPROLOL SUCCINATE ER 25 MG PO TB24
25.0000 mg | ORAL_TABLET | Freq: Every day | ORAL | Status: DC
  Administered 2018-11-22 – 2018-11-24 (×3): 25 mg via ORAL
  Filled 2018-11-22 (×3): qty 1

## 2018-11-22 MED ORDER — METHOTREXATE 2.5 MG PO TABS: 15.0000 mg | ORAL_TABLET | ORAL | Status: DC

## 2018-11-22 MED ORDER — HYDROXYCHLOROQUINE SULFATE 200 MG PO TABS
400.0000 mg | ORAL_TABLET | Freq: Every day | ORAL | Status: DC
  Administered 2018-11-22 – 2018-11-24 (×3): 400 mg via ORAL
  Filled 2018-11-22 (×3): qty 2

## 2018-11-22 MED ORDER — LEFLUNOMIDE 20 MG PO TABS
20.0000 mg | ORAL_TABLET | Freq: Every day | ORAL | Status: DC
  Administered 2018-11-22 – 2018-11-24 (×3): 20 mg via ORAL
  Filled 2018-11-22 (×3): qty 1

## 2018-11-22 MED ORDER — FOLIC ACID 1 MG PO TABS
1.0000 mg | ORAL_TABLET | Freq: Every day | ORAL | Status: DC
  Administered 2018-11-22 – 2018-11-24 (×3): 1 mg via ORAL
  Filled 2018-11-22 (×3): qty 1

## 2018-11-22 MED ORDER — SODIUM CHLORIDE 0.9 % IV BOLUS
1000.0000 mL | Freq: Once | INTRAVENOUS | Status: AC
  Administered 2018-11-22: 1000 mL via INTRAVENOUS

## 2018-11-22 MED ORDER — ALBUTEROL SULFATE HFA 108 (90 BASE) MCG/ACT IN AERS: 2.0000 | INHALATION_SPRAY | RESPIRATORY_TRACT | Status: DC | PRN

## 2018-11-22 MED ORDER — ATORVASTATIN CALCIUM 40 MG PO TABS
40.0000 mg | ORAL_TABLET | Freq: Every day | ORAL | Status: DC
  Administered 2018-11-22 – 2018-11-24 (×3): 40 mg via ORAL
  Filled 2018-11-22 (×3): qty 1

## 2018-11-22 MED ORDER — PREDNISONE 5 MG PO TABS
5.0000 mg | ORAL_TABLET | Freq: Every day | ORAL | Status: DC
  Administered 2018-11-22 – 2018-11-24 (×2): 5 mg via ORAL
  Filled 2018-11-22 (×2): qty 1

## 2018-11-22 NOTE — ED Notes (Signed)
Bed: WA23 Expected date:  Expected time:  Means of arrival:  Comments: EMS 

## 2018-11-22 NOTE — Progress Notes (Signed)
CSW met with pt who stated it was her understanding that pt had no more authorized SNF days.  Pt presented with flat affect and when asked if pt would:  1. Like to return home?  2. Go to a SNF, rehab, progress and then return home or.. -Pt answered she would like to but stated she could not access her money to pay privately as she, "have it, but have to straighten my money out so I can get to it".  When asked to explain pt stated she couldn't explain, that she was confused.  3.  Go to a long-term care facility, use what money she has and remain there, "spend down" and apply for Medicaid (pt stated she understood this process well).  Pt again stated in answer to this that she could not "get to her money and would need to go by the bank".  Pt presented as confused.  Of note:  Pt has been at [facility] for acute psychiatric needs in the past (depression?).  Pt stated they would not allow her back at [facility] but this has not been verified.  Pt finally stated she could not answer any more questions as she was "confused".  Plan: 1st shift CSW to call [facility] SNF whre pt was D/C'd from prior to returning to [facility] Santiago Glad in admissions) at ph: [phone] to find out:  1. Why pt was D/C'd from [facility] SNF (out of rehab days?).  2. if pt has any SNF days left that are authorized for rehab days by pt's ins [place]..   3. Can pt return to [facility] for more SNF days or to private pay and then spend down and apply for Medicaid to remain there as a Medicaid placement?  Per notes, pt was sent from her [place] for ALF placement evaluation which cannot be paid for by [facility] MEDICARE and thus ALF placement is only possible if pt can private pay for ALF Care.  CSW will continue to follow for D/C needs.  Alphonse Guild. Dylen Mcelhannon, LCSW, LCAS, CSI Transitions of Care Clinical Social Worker Care Coordination Department Ph: [phone]

## 2018-11-22 NOTE — ED Triage Notes (Signed)
Per EMS, patient from [facility] independent living, transported home today after she was in rehab for 20 days after admission for dehydration. C/o generalized weakness, too weak to stand alone. Denies N/V/D, cough, SOB, chest pain. Sent for evaluation for placement in assisted living.

## 2018-11-22 NOTE — ED Provider Notes (Signed)
[facility] DEPT Provider Note   CSN: [id number] Arrival date & time: 11/22/18  1429    History   Chief Complaint Chief Complaint  Patient presents with  . Weakness    HPI [name] is a 73 y.o. female with a hx of DM, HTN, & hypercholesterolemia who presents to the ED for placement from [place] [facility] independent living facility. Patient states she has had problems w/ generalized weakness, worse in the bilateral lower extremities for 3 weeks. She was seen in the ER for this 11/02/18, ultimately underwent MRI of the brain & L spine without acute pathology, neurology was consulted, did not require admission, did not think this was GBS, and she had SNF placement. Was at SNF for 20 days w/ rehab, discharged today back to her independent living facility. She feels she is unsafe @ independent living she has continued weakness w/ inability to independent ambulate with her walker, no acute change, returned purely for placement. [facility] facility felt she was no longer appropriate for their independent living facility.  Patient denies fever, chills, cough, shortness of breath, chest pain, abdominal pain, incontinence, or dysuria.      HPI  Past Medical History:  Diagnosis Date  . Anxiety   . Arthritis    rhematoid,osteoarthritis  . Diabetes mellitus without complication ([place])   . Elevated cholesterol   . Hemorrhoids   . Hypertension   . Hypothyroidism   . Joint pain   . Nocturia   . Sleeping difficulties     Patient Active Problem List   Diagnosis Date Noted  . MDD (major depressive disorder), recurrent episode, severe ([place]) 08/10/2018  . MDD (major depressive disorder), recurrent, severe, with psychosis ([place])   . Altered mental status 05/24/2018  . Ketonuria 05/24/2018  . Pain in right hand 09/11/2017  . Hypertension 03/07/2014  . Hyperlipidemia 03/07/2014  . Depression 03/07/2014  . Spinal stenosis, lumbar region, with neurogenic claudication  02/24/2014  . Type 2 diabetes mellitus without complication ([place]) 99/24/2683  . Rheumatoid arthritis ([place]) 02/24/2014  . Scoliosis of lumbar spine 02/13/2014    Past Surgical History:  Procedure Laterality Date  . ABDOMINAL HYSTERECTOMY    . BACK SURGERY    . CERVICAL FUSION  2004  . CHOLECYSTECTOMY    . EYE SURGERY Left    cataract  . PARS PLANA REPAIR OF RETINAL DEATACHMENT    . THYROIDECTOMY, PARTIAL       OB History   No obstetric history on file.      Home Medications    Prior to Admission medications   Medication Sig Start Date End Date Taking? Authorizing Provider  ACCU-CHEK GUIDE test strip CHECK BLOOD SUGAR ONCE A DAY 10/29/17   [provider]  albuterol (VENTOLIN HFA) 108 (90 Base) MCG/ACT inhaler Inhale 2 puffs into the lungs every 4 (four) hours as needed for wheezing or shortness of breath.    [provider]  aspirin EC 81 MG tablet Take 1 tablet (81 mg total) by mouth daily. 08/16/18   Connye Burkitt, NP  atorvastatin (LIPITOR) 40 MG tablet Take 1 tablet (40 mg total) by mouth daily. 08/16/18   Connye Burkitt, NP  cetirizine (ZYRTEC) 10 MG tablet Take 1 tablet (10 mg total) by mouth daily as needed for allergies. Patient not taking: Reported on 10/12/2018 08/16/18   Connye Burkitt, NP  Cholecalciferol (VITAMIN D) 50 MCG (2000 UT) tablet Take 2,000 Units by mouth daily.    [provider]  escitalopram (LEXAPRO) 20 MG tablet TAKE 1 TABLET (20 MG TOTAL) BY MOUTH DAILY. FOR MOOD 11/09/18   Donnal Moat T, PA-C  fluticasone (FLONASE) 50 MCG/ACT nasal spray Place 1 spray into both nostrils daily as needed for allergies or rhinitis.     [provider]  folic acid (FOLVITE) 1 MG tablet Take 1 mg by mouth daily.     [provider]  hydroxychloroquine (PLAQUENIL) 200 MG tablet Take 400 mg by mouth daily.    [provider]  leflunomide (ARAVA) 20 MG tablet Take 20 mg by mouth daily.    [provider]   methotrexate (RHEUMATREX) 15 MG tablet Take 1 tablet (15 mg total) by mouth once a week. Caution:Chemotherapy. Protect from light. Patient not taking: Reported on 11/02/2018 08/21/18   Connye Burkitt, NP  metoprolol succinate (TOPROL-XL) 25 MG 24 hr tablet Take 25 mg by mouth daily.    [provider]  metoprolol tartrate (LOPRESSOR) 25 MG tablet Take 0.5 tablets (12.5 mg total) by mouth 2 (two) times daily. For high blood pressure Patient not taking: Reported on 11/02/2018 08/16/18   Connye Burkitt, NP  predniSONE (DELTASONE) 5 MG tablet Take 5 mg by mouth daily with breakfast.    [provider]  risperiDONE (RISPERDAL) 0.5 MG tablet Take 1 pill in am Patient not taking: Reported on 11/02/2018 10/12/18   Donnal Moat T, PA-C  risperiDONE (RISPERDAL) 3 MG tablet Take 1 tablet (3 mg total) by mouth at bedtime. Patient not taking: Reported on 11/02/2018 10/12/18   Alen Blew    Family History Family History  Problem Relation Age of Onset  . Diabetes Mother   . Breast cancer Mother   . Gout Mother   . Alzheimer's disease Father   . Heart Problems Father   . Breast cancer Sister   . Alzheimer's disease Sister   . Diabetes Sister   . Gout Sister   . Heart Problems Sister   . Alzheimer's disease Brother   . Diabetes Brother   . Heart Problems Brother     Social History Social History   Tobacco Use  . Smoking status: Never Smoker  . Smokeless tobacco: Never Used  Substance Use Topics  . Alcohol use: No  . Drug use: No     Allergies   Contrast media [iodinated diagnostic agents]; Abilify [aripiprazole]; Ultram [tramadol hcl]; and Zithromax [azithromycin]   Review of Systems Review of Systems  Constitutional: Negative for chills and fever.  Respiratory: Negative for cough and shortness of breath.   Cardiovascular: Negative for chest pain.  Gastrointestinal: Negative for abdominal pain, diarrhea, nausea and vomiting.  Genitourinary: Negative for  dysuria and enuresis.  Neurological: Positive for weakness. Negative for dizziness, numbness and headaches.       Negative for incontinence.  All other systems reviewed and are negative.   Physical Exam Updated Vital Signs BP (!) 154/54 (BP Location: Left Arm)   Pulse 94   Temp 98.1 F (36.7 C) (Oral)   Resp 18   SpO2 95%   Physical Exam Vitals signs and nursing note reviewed.  Constitutional:      General: She is not in acute distress.    Appearance: She is well-developed. She is not toxic-appearing.  HENT:     Head: Normocephalic and atraumatic.  Eyes:     General:        Right eye: No discharge.        Left eye: No discharge.  Conjunctiva/sclera: Conjunctivae normal.  Neck:     Musculoskeletal: Neck supple.  Cardiovascular:     Rate and Rhythm: Normal rate and regular rhythm.  Pulmonary:     Effort: Pulmonary effort is normal. No respiratory distress.     Breath sounds: Normal breath sounds. No wheezing, rhonchi or rales.  Abdominal:     General: There is no distension.     Palpations: Abdomen is soft.     Tenderness: There is no abdominal tenderness.  Musculoskeletal:     Comments: Intact active range of motion throughout bilateral upper and lower extremities.  No point/focal bony tenderness.  Skin:    General: Skin is warm and dry.     Findings: No rash.  Neurological:     Mental Status: She is alert.     Comments: Clear speech.  CN III-XII grossly intact. 5 out of 5 symmetric grip strength.  Able to lift bilateral lower extremities off of the bed.  Able to plantar/dorsiflex against resistance bilaterally.  Ambulation deferred on initial assessment.  Psychiatric:        Behavior: Behavior normal.    ED Treatments / Results  Labs (all labs ordered are listed, but only abnormal results are displayed) Labs Reviewed  CBC - Abnormal; Notable for the following components:      Result Value   RBC 3.51 (*)    Hemoglobin 11.6 (*)    MCV 102.6 (*)    Platelets  99 (*)    All other components within normal limits  BASIC METABOLIC PANEL - Abnormal; Notable for the following components:   Potassium 3.0 (*)    BUN 24 (*)    Calcium 8.8 (*)    All other components within normal limits  URINALYSIS, ROUTINE W REFLEX MICROSCOPIC - Abnormal; Notable for the following components:   APPearance HAZY (*)    Ketones, ur 20 (*)    Leukocytes,Ua SMALL (*)    Bacteria, UA FEW (*)    All other components within normal limits  URINE CULTURE    EKG None  Radiology No results found.  Procedures Procedures (including critical care time)  Medications Ordered in ED Medications - No data to display   Initial Impression / Assessment and Plan / ED Course  I have reviewed the triage vital signs and the nursing notes.  Pertinent labs & imaging results that were available during my care of the patient were reviewed by me and considered in my medical decision making (see chart for details).   Patient returns to the emergency department from her independent living facility due to concern for placement.  Recently in rehab @ SNF for generalized weakness and difficulty with ambulation. Patient & her facility do not feel she is appropriate for independent living given her status. No acute change. Prior ER notes & work-up reviewed. Will obtain basic labs, do not feel further work-up is needed at this time given no acute change and prior work-up.   CBC: Baseline anemia. No leukocytosis.  BMP: Hypokalemia @ 3.0- oral replacement in the ER. Mild hypocalcemia @ 8.8. BUN mildly elevated- receiving fluids.  UA: Small leuks, few bacteria- will send for culture, no urinary sxs, doubt UTI.   Patient w/ mild tachycardia throughout ER stay, no chest pain or dyspnea, not responsive to fluids, discussed w/ her, she states she has not had her medications today which does include metoprolol- likely playing a factor, will re-order home meds. Considered thyroid function testing, but  this was performed a few  weeks ago and was normal.   Social work has been involved w/ patient, attempting to discuss w/ [facility] where patient previously was residing for rehab, waiting to hear back.   Anticipate need for ER boarding overnight at this time.   Patient care transitioned to Charlann Lange PA-C at change of shift pending re-discussion w/ CSW as well as re-evaluation of HR to ensure some improvement after her metoprolol.   Findings and plan of care discussed with supervising physician Dr. Francia Greaves who is in agreement.   10:10: HR improved to 104. Patient remains resting comfortable.   Final Clinical Impressions(s) / ED Diagnoses   Final diagnoses:  Weakness    ED Discharge Orders    None       Leafy Kindle 11/22/18 2218    Valarie Merino, MD 11/22/18 2232

## 2018-11-22 NOTE — ED Notes (Signed)
Pt made aware urine is needed 

## 2018-11-22 NOTE — Progress Notes (Addendum)
Consult request has been received. CSW attempting to follow up at present time.  CSW received a call from pt's EDP at ph: [phone] the ED Triage note states:  "Per EMS, patient from [facility] independent living, transported home today after she was in rehab for 20 days after admission for dehydration. C/o generalized weakness, too weak to stand alone. Denies N/V/D, cough, SOB, chest pain. Sent for evaluation for placement in assisted living".   CSW called [employer] Santiago Glad in admissions) at ph: [phone] to find out:  1. Why pt was D/C'd from [facility] SNF.  2. if pt has any SNF days left that are authorized for rehab days by pt's ins [place]..   3. Can pt return to [facility] for more SND days or to private pay and then spend down and apply for Medicaid to remain there as a Medicaid placement?  Per notes pt was sent from her [place] for ALF placement evaluation which cannot be paid for by [facility] MEDICARE and thus ALF placement is only possible if pt can private pay for ALF Care.  EDP available for update at ph: [place], LCSW, [place] Social Worker [phone]

## 2018-11-22 NOTE — ED Provider Notes (Signed)
?  Placement 4/21 - gen weakness - MRI brain and Lumbar with neuro consult - d/ch to SNF [facility]) back to independent living, came immediately back to ED for same weakness Plan: SW has been involved regarding disposition.  ** monitor tachycardia  SW to continue their assessment/disposition plan in the morning.   Discussed with Roderic Palau (SW) who confirms SW and/or CM will continue to resolve situation in the morning - d/ch back to independent living vs back to skilled nursing.   Tachycardia has resolved without recurrence through the night.    Charlann Lange, PA-C 11/23/18 0700    Tegeler, Gwenyth Allegra, MD 11/23/18 [phone]

## 2018-11-23 NOTE — ED Notes (Signed)
Pt arrived to room 29 calm and cooperative.  Pt was instructed to call if she needs anything.  Pt is resting quietly.

## 2018-11-23 NOTE — ED Notes (Addendum)
Pt A&O x 3, no distress noted, up at bedside commode with assist, pt unable to stand by herself, 2 person assist  Needed, comfort measures given, Purewick placed between labia attached to low wall suction.  HL to Left forearm DCed, no redness noted.  Safety sitter at bedside.

## 2018-11-23 NOTE — ED Notes (Signed)
Helped to bedside commode.   Pt is very weak and shaky.   Pt voided about 376ml.

## 2018-11-23 NOTE — Progress Notes (Signed)
CSW called [place] Santiago Glad in admissions) at ph: [phone] and Juliann Pulse in admissions  at ph: [phone] and left VM's asking why pt was discharged on their SECURE VM's.  CSW also called [facility] Main Desk and asked to speak to the CN to find out why pt was discharged.  CN did not know amnd CSW left a VM on the social worker's SECURE VM also.  Om 5/11 pt stated she had no more SNF days authorized by her insurer.  CSW will continue to follow for D/C needs.  Alphonse Guild. Kavina Cantave, LCSW, LCAS, CSI Transitions of Care Clinical Social Worker Care Coordination Department Ph: [phone]

## 2018-11-23 NOTE — ED Notes (Signed)
PT states she is not comfortable when asked. Repeatedly offered hospital bed to pt however pt refused multiple times.

## 2018-11-24 LAB — SARS CORONAVIRUS 2 BY RT PCR (HOSPITAL ORDER, PERFORMED IN ~~LOC~~ HOSPITAL LAB): SARS Coronavirus 2: NEGATIVE

## 2018-11-24 LAB — URINE CULTURE: Culture: 90000 — AB

## 2018-11-24 MED ORDER — CEPHALEXIN 250 MG PO CAPS
250.0000 mg | ORAL_CAPSULE | Freq: Four times a day (QID) | ORAL | Status: DC
  Administered 2018-11-24: 250 mg via ORAL
  Filled 2018-11-24: qty 1

## 2018-11-24 MED ORDER — CEPHALEXIN 250 MG PO CAPS
250.0000 mg | ORAL_CAPSULE | Freq: Once | ORAL | Status: AC
  Administered 2018-11-24: 250 mg via ORAL
  Filled 2018-11-24: qty 1

## 2018-11-24 MED ORDER — CEPHALEXIN 250 MG PO CAPS
250.0000 mg | ORAL_CAPSULE | Freq: Once | ORAL | Status: DC
  Filled 2018-11-24: qty 1

## 2018-11-24 NOTE — ED Notes (Signed)
Pt DC to [facility].   Pt calm, cooperative, no s/s of distress. Pt DC information given to Ambulance service. Pt belongings given to ambulance service. Pt off unit on stretcher.  Pt transported by ambulance.

## 2018-11-24 NOTE — Progress Notes (Signed)
CSW spoke to [place] in admissions at [facility] SNF who stated once negative COVID-19 test was sent via the hub to their fax machine pt can D/C to [facility].  CSW faxed negative COVID-19 test to [facility] SNF via the hub at 5:27pm  RN notified pt can arrive anytime to [facility] and that # for report is: [phone].  RN updated.  CSW will continue to follow for D/C needs.  Alphonse Guild. Tazaria Dlugosz, LCSW, LCAS, CSI Transitions of Care Clinical Social Worker Care Coordination Department Ph: [phone]     \

## 2018-11-24 NOTE — ED Provider Notes (Signed)
12:58-urine culture return indicating infection, Proteus mirabilis.  Keflex ordered.  She will require ongoing management with Keflex 250 mg 4 times daily for 5 days.  Social work asked me to send a COVID 19 screening test on her, for possible placement.   Daleen Bo, MD 11/24/18 [phone]

## 2018-11-24 NOTE — Evaluation (Signed)
Physical Therapy Evaluation Patient Details Name: [name] MRN: [id number] DOB: [birth year] Today's Date: 11/24/2018   History of Present Illness  Pt is a 73 year old female with PMHx significant for depression, diabetes, HTN, arthritis and presented to ED with c/o weakness  Clinical Impression  Pt seen in ED and presented for weakness. Pt currently with functional limitations due to the deficits listed below (see PT Problem List).  Pt will benefit from skilled PT to increase their independence and safety with mobility to allow discharge to the venue listed below.  Pt reports feeling extremely weak however agreeable to mobilize as tolerated with encouragement.  Pt attempted standing twice and required mod assist with RW and presents with posterior lean.  Recommend return to SNF as pt reports ILF does not provide any extra assistance.     Follow Up Recommendations SNF;Supervision/Assistance - 24 hour    Equipment Recommendations  Wheelchair (measurements PT);Wheelchair cushion (measurements PT)    Recommendations for Other Services       Precautions / Restrictions Precautions Precautions: Fall      Mobility  Bed Mobility Overal bed mobility: Needs Assistance Bed Mobility: Supine to Sit;Sit to Supine     Supine to sit: Mod assist Sit to supine: Mod assist   General bed mobility comments: pt attempting to assist, required increased time, assist for trunk upright, assist for LEs onto bed  Transfers Overall transfer level: Needs assistance Equipment used: Rolling walker (2 wheeled) Transfers: Sit to/from Stand Sit to Stand: Mod assist;+2 physical assistance;+2 safety/equipment         General transfer comment: assist to rise and stabilize, pt presents with posterior lean and has difficulty correcting with provided cues, attempted twice  Ambulation/Gait             General Gait Details: pt felt unable due to weakness  Stairs            Wheelchair  Mobility    Modified Rankin (Stroke Patients Only)       Balance Overall balance assessment: Needs assistance;History of Falls       Postural control: Posterior lean Standing balance support: Bilateral upper extremity supported Standing balance-Leahy Scale: Zero Standing balance comment: relies on BUE support and requires external assist due to posterior lean                             Pertinent Vitals/Pain Pain Assessment: Faces Faces Pain Scale: Hurts little more Pain Location: R lower leg Pain Descriptors / Indicators: Sore Pain Intervention(s): Monitored during session    Home Living Family/patient expects to be discharged to:: Skilled nursing facility Living Arrangements: Alone   Type of Home: Independent living facility         Home Equipment: Walker - 2 wheels Additional Comments: typically from ILF, pt reports prior to weakness she was ambulating short distances    Prior Function Level of Independence: Independent with assistive device(s)         Comments: "has a cane; was doing the best she could with adls; lives in independent living at [facility]. They do not do meals" from admission 3 weeks ago     Hand Dominance        Extremity/Trunk Assessment   Upper Extremity Assessment Upper Extremity Assessment: Generalized weakness    Lower Extremity Assessment Lower Extremity Assessment: Generalized weakness RLE Deficits / Details: grossly 2+/5 throughout, knee ext AROM -20* LLE Deficits / Details: grossly 2+/5  throughout, knee ext AROM -20*       Communication   Communication: No difficulties  Cognition Arousal/Alertness: Awake/alert Behavior During Therapy: Flat affect Overall Cognitive Status: Within Functional Limits for tasks assessed                                 General Comments: vague historian, very flat affect      General Comments      Exercises     Assessment/Plan    PT Assessment Patient needs  continued PT services  PT Problem List Decreased strength;Decreased balance;Decreased mobility;Decreased activity tolerance;Decreased knowledge of use of DME       PT Treatment Interventions DME instruction;Gait training;Therapeutic exercise;Therapeutic activities;Functional mobility training;Balance training;Patient/family education;Wheelchair mobility training;Neuromuscular re-education    PT Goals (Current goals can be found in the Care Plan section)  Acute Rehab PT Goals PT Goal Formulation: With patient Time For Goal Achievement: 12/08/18 Potential to Achieve Goals: Fair    Frequency Min 2X/week   Barriers to discharge        Co-evaluation               AM-PAC PT "6 Clicks" Mobility  Outcome Measure Help needed turning from your back to your side while in a flat bed without using bedrails?: A Little Help needed moving from lying on your back to sitting on the side of a flat bed without using bedrails?: A Lot Help needed moving to and from a bed to a chair (including a wheelchair)?: A Lot Help needed standing up from a chair using your arms (e.g., wheelchair or bedside chair)?: A Lot Help needed to walk in hospital room?: A Lot Help needed climbing 3-5 steps with a railing? : Total 6 Click Score: 12    End of Session Equipment Utilized During Treatment: Gait belt Activity Tolerance: Patient limited by fatigue Patient left: in bed;with call bell/phone within reach Nurse Communication: Mobility status PT Visit Diagnosis: Difficulty in walking, not elsewhere classified (R26.2);Muscle weakness (generalized) (M62.81)    Time: 9833-8250 PT Time Calculation (min) (ACUTE ONLY): 21 min   Charges:   PT Evaluation $PT Eval Low Complexity: [place], PT, DPT Acute Rehabilitation Services Office: [phone] Pager: [phone]  Trena Platt 11/24/2018, 2:13 PM

## 2018-11-24 NOTE — Progress Notes (Addendum)
2:15p - CSW received called from [employer] stating that she spoke with [facility] and was able to get patient approved for SNF. Blanch Media reports that patient will need to have a negative COVID-19 test before she can be admitted (test was administered at 2pm). Patient can arrive at any time, preferably before 8pm. Number for report is [phone].   CSW contacted Juliann Pulse with [facility] in regards to patient's discharge. Juliann Pulse stated that [facility] felt that patient had met her goals for SNF and not longer need the services. Juliann Pulse states that patient still have available SNF days. Juliann Pulse reports [facility] would be willing to take patient back as long as she will be covered by her insurance as she was discharged from [facility] within the past week. Patient will also need a updated COVID-19 test if readmitted (spoke with EDP to get an order for this).  CSW received phone call from Blanch Media who is assisting with [place] admissions. She is in contact with [facility] to see what qualifications patient needs to meet to be readmitted into SNF from the ED and it be covered. CSW informed Blanch Media that an order was put in for patient for PT evaluation.  CSW will continue to follow up.  [place], LCSW Transitions of Care Department [facility] ED [phone]

## 2018-11-24 NOTE — NC FL2 (Signed)
[place] LEVEL OF CARE SCREENING TOOL     IDENTIFICATION  Patient Name: [name] Birthdate: 06/28/1946 Sex: female Admission Date (Current Location): 11/22/2018  [facility] and [place] Number:  Herbalist and Address:  [facility],  [place] [address], [place]      Provider Number: [id number]  Attending Physician Name and Address:  Default, Provider, MD  Relative Name and Phone Number:       Current Level of Care: Hospital Recommended Level of Care: [place] Prior Approval Number:    Date Approved/Denied:   PASRR Number: [id number]  Discharge Plan: SNF    Current Diagnoses: Patient Active Problem List   Diagnosis Date Noted  . MDD (major depressive disorder), recurrent episode, severe ([place]) 08/10/2018  . MDD (major depressive disorder), recurrent, severe, with psychosis ([place])   . Altered mental status 05/24/2018  . Ketonuria 05/24/2018  . Pain in right hand 09/11/2017  . Hypertension 03/07/2014  . Hyperlipidemia 03/07/2014  . Depression 03/07/2014  . 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 Type 2 diabetes mellitus without complication ([place]) 02/40/9735  . Rheumatoid arthritis ([place]) 02/24/2014  . Scoliosis of lumbar spine 02/13/2014    Orientation RESPIRATION BLADDER Height & Weight     Self, Time, Situation, Place  Normal Continent Weight:   Height:     BEHAVIORAL SYMPTOMS/MOOD NEUROLOGICAL BOWEL NUTRITION STATUS      Continent Diet(normal)  AMBULATORY STATUS COMMUNICATION OF NEEDS Skin   Total Care(independent with two wheel walker and has cane per PT) Verbally Normal                       Personal Care Assistance Level of Assistance  Bathing, Feeding, Dressing Bathing Assistance: Limited assistance Feeding assistance: Limited assistance Dressing Assistance: Limited assistance     Functional Limitations Info  Speech, Hearing, Sight Sight Info:  Adequate Hearing Info: Adequate Speech Info: Adequate    SPECIAL CARE FACTORS FREQUENCY  PT (By licensed PT), OT (By licensed OT)     PT Frequency: 5x weekly OT Frequency: 5x weekly            Contractures Contractures Info: Not present    Additional Factors Info  Code Status, Allergies, Psychotropic Code Status Info: Full Allergies Info: CONTRAST MEDIA IODINATED DIAGNOSTIC AGENTS, ABILIFY ARIPIPRAZOLE, ULTRAM TRAMADOL HCL, ZITHROMAX AZITHROMYCIN  Psychotropic Info: Lexapro 20mg          Current Medications (11/24/2018):  This is the current hospital active medication list Current Facility-Administered Medications  Medication Dose Route Frequency Provider Last Rate Last Dose  . albuterol (VENTOLIN HFA) 108 (90 Base) MCG/ACT inhaler 2 puff  2 puff Inhalation Q4H PRN Petrucelli, Samantha R, PA-C      . aspirin EC tablet 81 mg  81 mg Oral Daily Petrucelli, Samantha R, PA-C   81 mg at 11/24/18 0909  . atorvastatin (LIPITOR) tablet 40 mg  40 mg Oral Daily Petrucelli, Samantha R, PA-C   40 mg at 11/24/18 0910  . cephALEXin (KEFLEX) capsule 250 mg  250 mg Oral Q6H Daleen Bo, MD      . escitalopram (LEXAPRO) tablet 20 mg  20 mg Oral Daily Petrucelli, Samantha R, PA-C   20 mg at 11/24/18 0910  . folic acid (FOLVITE) tablet 1 mg  1 mg Oral Daily Petrucelli, Samantha R, PA-C   1 mg at 11/24/18 0910  . hydroxychloroquine (PLAQUENIL) tablet 400 mg  400 mg Oral Daily Petrucelli,  Samantha R, PA-C   400 mg at 11/24/18 9509  . leflunomide (ARAVA) tablet 20 mg  20 mg Oral Daily Petrucelli, Samantha R, PA-C   20 mg at 11/24/18 0909  . metoprolol succinate (TOPROL-XL) 24 hr tablet 25 mg  25 mg Oral Daily Petrucelli, Samantha R, PA-C   25 mg at 11/24/18 0910  . predniSONE (DELTASONE) tablet 5 mg  5 mg Oral Q breakfast Petrucelli, Samantha R, PA-C   5 mg at 11/24/18 3267   Current Outpatient Medications  Medication Sig Dispense Refill  . aspirin EC 81 MG tablet Take 1 tablet (81 mg total) by  mouth daily. 1 tablet 0  . atorvastatin (LIPITOR) 40 MG tablet Take 1 tablet (40 mg total) by mouth daily. 1 tablet 0  . escitalopram (LEXAPRO) 20 MG tablet TAKE 1 TABLET (20 MG TOTAL) BY MOUTH DAILY. FOR MOOD 90 tablet 0  . folic acid (FOLVITE) 1 MG tablet Take 1 mg by mouth daily.     . hydroxychloroquine (PLAQUENIL) 200 MG tablet Take 400 mg by mouth daily.    Marland Kitchen leflunomide (ARAVA) 20 MG tablet Take 20 mg by mouth daily.    . methotrexate 2.5 MG tablet Take 15 mg by mouth once a week. Take 6 tablets once a week.    . metoprolol succinate (TOPROL-XL) 25 MG 24 hr tablet Take 25 mg by mouth daily.    . predniSONE (DELTASONE) 5 MG tablet Take 5 mg by mouth daily with breakfast.    . ACCU-CHEK GUIDE test strip CHECK BLOOD SUGAR ONCE A DAY  3  . albuterol (VENTOLIN HFA) 108 (90 Base) MCG/ACT inhaler Inhale 2 puffs into the lungs every 4 (four) hours as needed for wheezing or shortness of breath.    . fluticasone (FLONASE) 50 MCG/ACT nasal spray Place 1 spray into both nostrils daily as needed for allergies or rhinitis.        Discharge Medications: Please see discharge summary for a list of discharge medications.  Relevant Imaging Results:  Relevant Lab Results:   Additional Information SS# [id number]    Janace Hoard, LCSW

## 2018-11-24 NOTE — Progress Notes (Signed)
PT Cancellation Note  Patient Details Name: [name] MRN: [id number] DOB: [birth year]   Cancelled Treatment:     Pt just received lunch.  RN requested PT to check back.   Denson Niccoli,KATHrine E 11/24/2018, 11:36 AM  [place], DPT Acute Rehabilitation Services Office: [phone] Pager: [phone]

## 2018-11-25 ENCOUNTER — Telehealth: Payer: Self-pay

## 2018-11-25 NOTE — Telephone Encounter (Signed)
Post ED Visit - Positive Culture Follow-up  Culture report reviewed by antimicrobial stewardship pharmacist: [place] Team []  Elenor Quinones, Pharm.D. []  Heide Guile, Pharm.D., BCPS AQ-ID []  Parks Neptune, Pharm.D., BCPS []  Alycia Rossetti, Pharm.D., BCPS []  [place], Pharm.D., BCPS, AAHIVP []  Legrand Como, Pharm.D., BCPS, AAHIVP []  Salome Arnt, PharmD, BCPS []  Johnnette Gourd, PharmD, BCPS []  Hughes Better, PharmD, BCPS []  Leeroy Cha, PharmD []  Laqueta Linden, PharmD, BCPS []  Albertina Parr, PharmD  Comfort Team []  Leodis Sias, PharmD []  Lindell Spar, PharmD []  Royetta Asal, PharmD []  Graylin Shiver, Rph []  Rema Fendt) Glennon Mac, PharmD []  Arlyn Dunning, PharmD []  Netta Cedars, PharmD [x]  Dia Sitter, PharmD []  Leone Haven, PharmD []  Gretta Arab, PharmD []  Theodis Shove, PharmD []  Peggyann Juba, PharmD []  Reuel Boom, PharmD   Positive urine culture Treated with Cephalexin, organism sensitive to the same and no further patient follow-up is required at this time.  Genia Del 11/25/2018, 8:55 AM

## 2019-03-15 ENCOUNTER — Encounter: Payer: Self-pay | Admitting: Adult Health

## 2019-03-15 ENCOUNTER — Ambulatory Visit (INDEPENDENT_AMBULATORY_CARE_PROVIDER_SITE_OTHER): Payer: Medicare Other | Admitting: Adult Health

## 2019-03-15 ENCOUNTER — Other Ambulatory Visit: Payer: Self-pay

## 2019-03-15 DIAGNOSIS — F29 Unspecified psychosis not due to a substance or known physiological condition: Secondary | ICD-10-CM

## 2019-03-15 DIAGNOSIS — F331 Major depressive disorder, recurrent, moderate: Secondary | ICD-10-CM | POA: Diagnosis not present

## 2019-03-15 DIAGNOSIS — F411 Generalized anxiety disorder: Secondary | ICD-10-CM

## 2019-03-15 MED ORDER — RISPERIDONE 0.5 MG PO TBDP: 0.5000 mg | ORAL_TABLET | Freq: Every day | ORAL | 2 refills | Status: DC

## 2019-03-15 MED ORDER — ESCITALOPRAM OXALATE 20 MG PO TABS: 20.0000 mg | ORAL_TABLET | Freq: Every day | ORAL | 0 refills | Status: DC

## 2019-03-15 NOTE — Progress Notes (Signed)
[name] [id number] [birth year] 73 y.o.  Subjective:   Patient ID:  [name] is a 73 y.o. (DOB 1946/02/24) female.  Chief Complaint: No chief complaint on file.   HPI [name] presents to the office today for follow-up of anxiety, depression, psychosis, and insomnia.  Describes mood today as "not so good". Irritable. Flat. Mood symptoms - reports depression, anxiety, and irritability. Stating "I'm not doing very well". Stating "I'm mostly mad at myself". Also stating "I have problems talking to people". [facility]. Stating "I would like to leave". Also stating "I really don't want to be there to tell you the truth". Was in independent living at the "[facility] prior to falling. Stating "I get confused about things easily". Stating "I stay to myself mostly. Has a sister and brother local. Does not get to talk to them much. Decreased interest and motivation. Taking medications as prescribed.  Energy levels low "I have no energy". Active, unable to establish an exercise routine with current physical disabilities.  Enjoys some usual interests and activities - crossword puzzles .  Appetite adequate. Weight stable. Sleeps better some nights than others. Averages 3 to 4 hours. Naps during the day. Difficulties with focus and concentration. Completing tasks. Managing aspects of household.  Denies SI or HI. Denies AH or VH.  Review of Systems:  Review of Systems  Musculoskeletal: Negative for gait problem.  Neurological: Positive for weakness. Negative for tremors.  Psychiatric/Behavioral:       Please refer to HPI    Medications: I have reviewed the patient's current medications.  Current Outpatient Medications  Medication Sig Dispense Refill  . ACCU-CHEK GUIDE test strip CHECK BLOOD SUGAR ONCE A DAY  3  . albuterol (VENTOLIN HFA) 108 (90 Base) MCG/ACT inhaler Inhale 2 puffs into the lungs every 4 (four) hours as needed for  wheezing or shortness of breath.    Marland Kitchen aspirin EC 81 MG tablet Take 1 tablet (81 mg total) by mouth daily. 1 tablet 0  . atorvastatin (LIPITOR) 40 MG tablet Take 1 tablet (40 mg total) by mouth daily. 1 tablet 0  . escitalopram (LEXAPRO) 20 MG tablet Take 1 tablet (20 mg total) by mouth daily. For mood 90 tablet 0  . fluticasone (FLONASE) 50 MCG/ACT nasal spray Place 1 spray into both nostrils daily as needed for allergies or rhinitis.     . folic acid (FOLVITE) 1 MG tablet Take 1 mg by mouth daily.     . hydroxychloroquine (PLAQUENIL) 200 MG tablet Take 400 mg by mouth daily.    Marland Kitchen leflunomide (ARAVA) 20 MG tablet Take 20 mg by mouth daily.    . methotrexate 2.5 MG tablet Take 15 mg by mouth once a week. Take 6 tablets once a week.    . metoprolol succinate (TOPROL-XL) 25 MG 24 hr tablet Take 25 mg by mouth daily.    . predniSONE (DELTASONE) 5 MG tablet Take 5 mg by mouth daily with breakfast.    . risperiDONE (RISPERDAL M-TAB) 0.5 MG disintegrating tablet Take 1 tablet (0.5 mg total) by mouth at bedtime. 30 tablet 2   No current facility-administered medications for this visit.     Medication Side Effects: None  Allergies:  Allergies  Allergen Reactions  . Contrast Media [Iodinated Diagnostic Agents] Anaphylaxis and Hives    Only the retina dye;   . Abilify [Aripiprazole]     Pt dos not recall reaction  . Ultram [Tramadol Hcl]  Other (See Comments)    unknown  . Zithromax [Azithromycin]     Anxiety heightened    Past Medical History:  Diagnosis Date  . Anxiety   . Arthritis    rhematoid,osteoarthritis  . Diabetes mellitus without complication ([place])   . Elevated cholesterol   . Hemorrhoids   . Hypertension   . Hypothyroidism   . Joint pain   . Nocturia   . Sleeping difficulties     Family History  Problem Relation Age of Onset  . Diabetes Mother   . Breast cancer Mother   . Gout Mother   . Alzheimer's disease Father   . Heart Problems Father   . Breast cancer Sister    . Alzheimer's disease Sister   . Diabetes Sister   . Gout Sister   . Heart Problems Sister   . Alzheimer's disease Brother   . Diabetes Brother   . Heart Problems Brother     Social History   Socioeconomic History  . Marital status: Widowed    Spouse name: Not on file  . Number of children: 0  . Years of education: HS  . Highest education level: Not on file  Occupational History  . Not on file  Social Needs  . Financial resource strain: Not on file  . Food insecurity    Worry: Not on file    Inability: Not on file  . Transportation needs    Medical: Not on file    Non-medical: Not on file  Tobacco Use  . Smoking status: Never Smoker  . Smokeless tobacco: Never Used  Substance and Sexual Activity  . Alcohol use: No  . Drug use: No  . Sexual activity: Not Currently  Lifestyle  . Physical activity    Days per week: Not on file    Minutes per session: Not on file  . Stress: Not on file  Relationships  . Social Herbalist on phone: Not on file    Gets together: Not on file    Attends religious service: Not on file    Active member of club or organization: Not on file    Attends meetings of clubs or organizations: Not on file    Relationship status: Not on file  . Intimate partner violence    Fear of current or ex partner: Not on file    Emotionally abused: Not on file    Physically abused: Not on file    Forced sexual activity: Not on file  Other Topics Concern  . Not on file  Social History Narrative   Drinks 3 caffeine drinks a day     Past Medical History, Surgical history, Social history, and Family history were reviewed and updated as appropriate.   Please see review of systems for further details on the patient's review from today.   Objective:   Physical Exam:  There were no vitals taken for this visit.  Physical Exam Psychiatric:        Mood and Affect: Mood is depressed. Affect is flat.        Behavior: Behavior is agitated.         Thought Content: Thought content is paranoid.        Cognition and Memory: Cognition is impaired.     Lab Review:     Component Value Date/Time   NA 142 11/22/2018 1531   K 3.0 (L) 11/22/2018 1531   CL 99 11/22/2018 1531   CO2 30 11/22/2018 1531  GLUCOSE 88 11/22/2018 1531   BUN 24 (H) 11/22/2018 1531   CREATININE 0.64 11/22/2018 1531   CALCIUM 8.8 (L) 11/22/2018 1531   PROT 5.5 (L) 11/02/2018 0848   ALBUMIN 3.5 11/02/2018 0848   AST 80 (H) 11/02/2018 0848   ALT 32 11/02/2018 0848   ALKPHOS 50 11/02/2018 0848   BILITOT 1.1 11/02/2018 0848   GFRNONAA >60 11/22/2018 1531   GFRAA >60 11/22/2018 1531       Component Value Date/Time   WBC 5.0 11/22/2018 1531   RBC 3.51 (L) 11/22/2018 1531   HGB 11.6 (L) 11/22/2018 1531   HCT 36.0 11/22/2018 1531   HCT 34.7 05/25/2018 1125   PLT 99 (L) 11/22/2018 1531   MCV 102.6 (H) 11/22/2018 1531   MCH 33.0 11/22/2018 1531   MCHC 32.2 11/22/2018 1531   RDW 13.7 11/22/2018 1531   LYMPHSABS 0.3 (L) 11/02/2018 0355   MONOABS 0.9 11/02/2018 0355   EOSABS 0.0 11/02/2018 0355   BASOSABS 0.1 11/02/2018 0355    Lithium Lvl  Date Value Ref Range Status  08/11/2018 <0.06 (L) 0.60 - 1.20 mmol/L Final    Comment:    REPEATED TO VERIFY Performed at [facility], [place] [address] [place], [place] [address]      No results found for: PHENYTOIN, PHENOBARB, VALPROATE, CBMZ   .res Assessment: Plan:     Plan:  1. Continue Lexapro 20mg  daily 2. Add Risperdal 0.5mg  at hs for paranoia - was taking 0.5mg  in am and 3mg  at hs - patient notes she stopped taking, but willing to restart with a low dose.   RTC 4 weeks  Patient advised to contact office with any questions, adverse effects, or acute worsening in signs and symptoms.  Discussed potential metabolic side effects associated with atypical antipsychotics, as well as potential risk for movement side effects. Advised pt to contact office if movement side effects occur.    Diagnoses and all orders for this visit:  Major depressive disorder, recurrent episode, moderate (HCC) -     escitalopram (LEXAPRO) 20 MG tablet; Take 1 tablet (20 mg total) by mouth daily. For mood -     risperiDONE (RISPERDAL M-TAB) 0.5 MG disintegrating tablet; Take 1 tablet (0.5 mg total) by mouth at bedtime.  Generalized anxiety disorder -     escitalopram (LEXAPRO) 20 MG tablet; Take 1 tablet (20 mg total) by mouth daily. For mood -     risperiDONE (RISPERDAL M-TAB) 0.5 MG disintegrating tablet; Take 1 tablet (0.5 mg total) by mouth at bedtime.  Psychosis, unspecified psychosis type ([place]) -     risperiDONE (RISPERDAL M-TAB) 0.5 MG disintegrating tablet; Take 1 tablet (0.5 mg total) by mouth at bedtime.     Please see After Visit Summary for patient specific instructions.  Future Appointments  Date Time Provider [place]  04/12/2019  9:30 AM Lachrisha Ziebarth, Berdie Ogren, NP CP-CP None    No orders of the defined types were placed in this encounter.   -------------------------------

## 2019-03-30 ENCOUNTER — Telehealth: Payer: Self-pay | Admitting: Adult Health

## 2019-03-30 NOTE — Telephone Encounter (Signed)
May need inpatient stabilization - she was acute at out visit.

## 2019-03-30 NOTE — Telephone Encounter (Signed)
Rtc to [employer] but caregiver on her lunch break, will try back in an hour.

## 2019-03-30 NOTE — Telephone Encounter (Signed)
Per [place] at [facility], patient is refusing medications, not wanting to eat, trying to leave facility, mood has changed a little more irritable.

## 2019-03-30 NOTE — Telephone Encounter (Signed)
Noted  

## 2019-04-04 ENCOUNTER — Encounter (HOSPITAL_COMMUNITY): Payer: Self-pay | Admitting: Emergency Medicine

## 2019-04-04 ENCOUNTER — Emergency Department (HOSPITAL_COMMUNITY)
Admission: EM | Admit: 2019-04-04 | Discharge: 2019-04-05 | Disposition: A | Discharge status: Other Acute Inpatient Hospital | Payer: Medicare Other | Attending: Emergency Medicine | Admitting: Emergency Medicine

## 2019-04-04 DIAGNOSIS — Z20828 Contact with and (suspected) exposure to other viral communicable diseases: Secondary | ICD-10-CM | POA: Insufficient documentation

## 2019-04-04 DIAGNOSIS — F333 Major depressive disorder, recurrent, severe with psychotic symptoms: Secondary | ICD-10-CM | POA: Diagnosis present

## 2019-04-04 DIAGNOSIS — F22 Delusional disorders: Secondary | ICD-10-CM | POA: Insufficient documentation

## 2019-04-04 DIAGNOSIS — M069 Rheumatoid arthritis, unspecified: Secondary | ICD-10-CM | POA: Diagnosis not present

## 2019-04-04 DIAGNOSIS — Z7982 Long term (current) use of aspirin: Secondary | ICD-10-CM | POA: Diagnosis not present

## 2019-04-04 DIAGNOSIS — Z79899 Other long term (current) drug therapy: Secondary | ICD-10-CM | POA: Insufficient documentation

## 2019-04-04 DIAGNOSIS — E119 Type 2 diabetes mellitus without complications: Secondary | ICD-10-CM | POA: Diagnosis not present

## 2019-04-04 DIAGNOSIS — Z046 Encounter for general psychiatric examination, requested by authority: Secondary | ICD-10-CM | POA: Diagnosis present

## 2019-04-04 LAB — CBC WITH DIFFERENTIAL/PLATELET
Abs Immature Granulocytes: 0.01 10*3/uL (ref 0.00–0.07)
Basophils Absolute: 0 10*3/uL (ref 0.0–0.1)
Basophils Relative: 1 %
Eosinophils Absolute: 0.1 10*3/uL (ref 0.0–0.5)
Eosinophils Relative: 1 %
HCT: 37.4 % (ref 36.0–46.0)
Hemoglobin: 11.7 g/dL — ABNORMAL LOW (ref 12.0–15.0)
Immature Granulocytes: 0 %
Lymphocytes Relative: 17 %
Lymphs Abs: 1 10*3/uL (ref 0.7–4.0)
MCH: 32.1 pg (ref 26.0–34.0)
MCHC: 31.3 g/dL (ref 30.0–36.0)
MCV: 102.5 fL — ABNORMAL HIGH (ref 80.0–100.0)
Monocytes Absolute: 0.5 10*3/uL (ref 0.1–1.0)
Monocytes Relative: 9 %
Neutro Abs: 4.2 10*3/uL (ref 1.7–7.7)
Neutrophils Relative %: 72 %
Platelets: 114 10*3/uL — ABNORMAL LOW (ref 150–400)
RBC: 3.65 MIL/uL — ABNORMAL LOW (ref 3.87–5.11)
RDW: 13.5 % (ref 11.5–15.5)
WBC: 5.8 10*3/uL (ref 4.0–10.5)
nRBC: 0 % (ref 0.0–0.2)

## 2019-04-04 LAB — COMPREHENSIVE METABOLIC PANEL
ALT: 15 U/L (ref 0–44)
AST: 19 U/L (ref 15–41)
Albumin: 3.8 g/dL (ref 3.5–5.0)
Alkaline Phosphatase: 71 U/L (ref 38–126)
Anion gap: 9 (ref 5–15)
BUN: 15 mg/dL (ref 8–23)
CO2: 26 mmol/L (ref 22–32)
Calcium: 8.9 mg/dL (ref 8.9–10.3)
Chloride: 107 mmol/L (ref 98–111)
Creatinine, Ser: 0.62 mg/dL (ref 0.44–1.00)
GFR calc Af Amer: >60 mL/min (ref >60)
GFR calc non Af Amer: >60 mL/min (ref >60)
Glucose, Bld: 83 mg/dL (ref 70–99)
Potassium: 3.3 mmol/L — ABNORMAL LOW (ref 3.5–5.1)
Sodium: 142 mmol/L (ref 135–145)
Total Bilirubin: 0.8 mg/dL (ref 0.3–1.2)
Total Protein: 6 g/dL — ABNORMAL LOW (ref 6.5–8.1)

## 2019-04-04 LAB — LACTIC ACID, PLASMA: Lactic Acid, Venous: 0.9 mmol/L (ref 0.5–1.9)

## 2019-04-04 LAB — CBG MONITORING, ED: Glucose-Capillary: 72 mg/dL (ref 70–99)

## 2019-04-04 NOTE — ED Notes (Signed)
Pt attempting to leave through the EMS bay. Pt redirected back to room with assistance from [place], Therapist, sports and [employer], Therapist, sports. When this writer attempted to leave her, pt tried to follow this Probation officer out the door. Charge RN allowed pt to sit in a recliner at [employer] due to no sitter being available. Pt provided with a popsicle.

## 2019-04-04 NOTE — ED Triage Notes (Addendum)
Patient here via EMS and [employer] IVC'd by nurse at [facility]. Reports that patient is not eating or drinking. Refusing food, liquids, and meds for the past 7 days. Hx type 2 diabetes, hypertension. Reports that patient is SI, patient denies. Staff reports patient has tried to leave facility 5 times.

## 2019-04-04 NOTE — ED Provider Notes (Signed)
[facility] DEPT Provider Note   CSN: [id number] Arrival date & time: 04/04/19  2010     History   Chief Complaint Chief Complaint  Patient presents with  . IVC  . Paranoid    HPI [name] is a 73 y.o. female.     Patient is a resident at [facility] skilled nursing facility who is sent to the ED today under IVC, placed by her nurse out of concern for paranoid behavior, and her stating she wanted to kill herself. Per staff at [facility], there is no documented history of dementia. Refusal to take medications started around the 11th, unclear if this was continuous or sporadic. Elopement attempt first noted on the 14th (Sept). The nurse providing the information states she has noticed a steady decline in the patient's mental health since these occurrences. Per IVC petition, the patient has not had anything to eat or drink in 7 days. The patient states she does not eat or drink anything from the facility because she feels they are trying to kill her. She further states she is uncomfortable here because she is sure this is not [facility] as she is told it is.    The history is provided by the patient and the nursing home.    Past Medical History:  Diagnosis Date  . Anxiety   . Arthritis    rhematoid,osteoarthritis  . Diabetes mellitus without complication ([place])   . Elevated cholesterol   . Hemorrhoids   . Hypertension   . Hypothyroidism   . Joint pain   . Nocturia   . Sleeping difficulties     Patient Active Problem List   Diagnosis Date Noted  . MDD (major depressive disorder), recurrent episode, severe ([place]) 08/10/2018  . MDD (major depressive disorder), recurrent, severe, with psychosis ([place])   . Altered mental status 05/24/2018  . Ketonuria 05/24/2018  . Pain in right hand 09/11/2017  . Hypertension 03/07/2014  . Hyperlipidemia 03/07/2014  . Depression 03/07/2014  . Spinal stenosis, lumbar region, with  neurogenic claudication 02/24/2014  . Type 2 diabetes mellitus without complication ([place]) [id number]  . Rheumatoid arthritis ([place]) 02/24/2014  . Scoliosis of lumbar spine 02/13/2014    Past Surgical History:  Procedure Laterality Date  . ABDOMINAL HYSTERECTOMY    . BACK SURGERY    . CERVICAL FUSION  2004  . CHOLECYSTECTOMY    . EYE SURGERY Left    cataract  . PARS PLANA REPAIR OF RETINAL DEATACHMENT    . THYROIDECTOMY, PARTIAL       OB History   No obstetric history on file.      Home Medications    Prior to Admission medications   Medication Sig Start Date End Date Taking? Authorizing Provider  ACCU-CHEK GUIDE test strip CHECK BLOOD SUGAR ONCE A DAY 10/29/17   [provider]  albuterol (VENTOLIN HFA) 108 (90 Base) MCG/ACT inhaler Inhale 2 puffs into the lungs every 4 (four) hours as needed for wheezing or shortness of breath.    [provider]  aspirin EC 81 MG tablet Take 1 tablet (81 mg total) by mouth daily. 08/16/18   Connye Burkitt, NP  atorvastatin (LIPITOR) 40 MG tablet Take 1 tablet (40 mg total) by mouth daily. 08/16/18   Connye Burkitt, NP  escitalopram (LEXAPRO) 20 MG tablet Take 1 tablet (20 mg total) by mouth daily. For mood 03/15/19   Mozingo, Berdie Ogren, NP  fluticasone (FLONASE) 50 MCG/ACT nasal spray  Place 1 spray into both nostrils daily as needed for allergies or rhinitis.     [provider]  folic acid (FOLVITE) 1 MG tablet Take 1 mg by mouth daily.     [provider]  hydroxychloroquine (PLAQUENIL) 200 MG tablet Take 400 mg by mouth daily.    [provider]  leflunomide (ARAVA) 20 MG tablet Take 20 mg by mouth daily.    [provider]  methotrexate 2.5 MG tablet Take 15 mg by mouth once a week. Take 6 tablets once a week.    [provider]  metoprolol succinate (TOPROL-XL) 25 MG 24 hr tablet Take 25 mg by mouth daily.    [provider]  predniSONE (DELTASONE) 5 MG tablet Take 5  mg by mouth daily with breakfast.    [provider]  risperiDONE (RISPERDAL M-TAB) 0.5 MG disintegrating tablet Take 1 tablet (0.5 mg total) by mouth at bedtime. 03/15/19   Mozingo, Berdie Ogren, NP    Family History Family History  Problem Relation Age of Onset  . Diabetes Mother   . Breast cancer Mother   . Gout Mother   . Alzheimer's disease Father   . Heart Problems Father   . Breast cancer Sister   . Alzheimer's disease Sister   . Diabetes Sister   . Gout Sister   . Heart Problems Sister   . Alzheimer's disease Brother   . Diabetes Brother   . Heart Problems Brother     Social History Social History   Tobacco Use  . Smoking status: Never Smoker  . Smokeless tobacco: Never Used  Substance Use Topics  . Alcohol use: No  . Drug use: No     Allergies   Contrast media [iodinated diagnostic agents], Abilify [aripiprazole], Ultram [tramadol hcl], and Zithromax [azithromycin]   Review of Systems Review of Systems  Unable to perform ROS: Psychiatric disorder     Physical Exam Updated Vital Signs BP (!) 152/127 (BP Location: Left Arm)   Pulse (!) 121   Temp 99.6 F (37.6 C) (Oral)   Resp (!) 22   SpO2 97%   Physical Exam Constitutional:      Appearance: She is well-developed.  HENT:     Head: Normocephalic.  Neck:     Musculoskeletal: Normal range of motion and neck supple.  Cardiovascular:     Rate and Rhythm: Regular rhythm. Tachycardia present.  Pulmonary:     Effort: Pulmonary effort is normal.     Breath sounds: Normal breath sounds.  Abdominal:     General: Bowel sounds are normal.     Palpations: Abdomen is soft.     Tenderness: There is no abdominal tenderness. There is no guarding or rebound.  Musculoskeletal: Normal range of motion.  Skin:    General: Skin is warm and dry.     Findings: No rash.  Neurological:     Mental Status: She is alert.  Psychiatric:        Mood and Affect: Affect is angry.        Thought Content:  Thought content is paranoid.      ED Treatments / Results  Labs (all labs ordered are listed, but only abnormal results are displayed) Labs Reviewed  URINE CULTURE  CBC WITH DIFFERENTIAL/PLATELET  LACTIC ACID, PLASMA  LACTIC ACID, PLASMA  COMPREHENSIVE METABOLIC PANEL  URINALYSIS, ROUTINE W REFLEX MICROSCOPIC    EKG None  Radiology No results found.  Procedures Procedures (including critical care time)  Medications  Ordered in ED Medications - No data to display   Initial Impression / Assessment and Plan / ED Course  I have reviewed the triage vital signs and the nursing notes.  Pertinent labs & imaging results that were available during my care of the patient were reviewed by me and considered in my medical decision making (see chart for details).        Patient to ED under IVC petition taken out by RN at [facility] skilled nursing where patient is a resident citing paranoid behavior, wandering (attempting to leave), refusal to eat, drink or take medications, making statements reflecting paranoia and SI.   The patient has made an attempt to leave the ED and is being directly supervised here for her safety. She tells me she knows this is not [facility], and that people are trying to kill her, including staff here and at [facility].   She is hypertensive, tachycardic and tachypneic on arrival, temp 99.6. She will need to be medically cleared prior to TTS evaluation. Labs ordered.   Labs are unremarkable. UA pending.   1:40 -She has been evaluated by TTS and is found to meet inpatient criteria. Geripsych placement will be sought. COVID ordered in anticipation of placement to facility.  Final Clinical Impressions(s) / ED Diagnoses   Final diagnoses:  None   1. Paranoia  ED Discharge Orders    None       Charlann Lange, Hershal Coria 04/05/19 [id number]    Julianne Rice, MD 04/05/19 1630

## 2019-04-04 NOTE — ED Notes (Signed)
AC called for sitter, reported that no sitter was available.

## 2019-04-05 ENCOUNTER — Telehealth: Payer: Self-pay

## 2019-04-05 ENCOUNTER — Emergency Department (HOSPITAL_COMMUNITY): Payer: Medicare Other

## 2019-04-05 LAB — URINALYSIS, ROUTINE W REFLEX MICROSCOPIC
Bilirubin Urine: NEGATIVE
Glucose, UA: NEGATIVE mg/dL
Ketones, ur: 20 mg/dL — AB
Nitrite: NEGATIVE
Protein, ur: 30 mg/dL — AB
RBC / HPF: >50 RBC/hpf — ABNORMAL HIGH (ref 0–5)
Specific Gravity, Urine: 1.025 (ref 1.005–1.030)
pH: 5 (ref 5.0–8.0)

## 2019-04-05 LAB — SARS CORONAVIRUS 2 BY RT PCR (HOSPITAL ORDER, PERFORMED IN ~~LOC~~ HOSPITAL LAB): SARS Coronavirus 2: NEGATIVE

## 2019-04-05 MED ORDER — OLANZAPINE 5 MG PO TABS: 5.0000 mg | ORAL_TABLET | Freq: Once | ORAL | Status: AC

## 2019-04-05 MED ORDER — OLANZAPINE 10 MG IM SOLR
5.0000 mg | Freq: Once | INTRAMUSCULAR | Status: AC
  Administered 2019-04-05: 18:00:00 5 mg via INTRAMUSCULAR
  Filled 2019-04-05: qty 10

## 2019-04-05 MED ORDER — STERILE WATER FOR INJECTION IJ SOLN
INTRAMUSCULAR | Status: AC
  Filled 2019-04-05: qty 10

## 2019-04-05 NOTE — ED Notes (Signed)
Pt arrived to unit.  She is irritable and uncooperative.  She refused to change into scrubs so she is sitting in chair with sitter at bedside.

## 2019-04-05 NOTE — Telephone Encounter (Signed)
Received fax and phone call from Pt's living community at [facility] on 04/04/2019 from nurse Helene Kelp that patient has been refusing medications, not eating or drinking, very paranoid she states to staff els facility is trying to kill her. Pt has been trying to leave as well. Facility has called EMS several times but patient refuses to go. Consulted with Rollene Fare and she recommend [facility] office and start process for pt to be IVC to be assessed at hospital. Informed nurse, Helene Kelp and she would proceed. Instructed to call back with an update.  According to records patient was assessed and admitted IVC to hospital at this time.

## 2019-04-05 NOTE — BH Assessment (Signed)
Tele Assessment Note   Patient Name: [name] MRN: [id number] Referring Physician: Charlann Lange, PA Location of Patient: [facility] Location of Provider: [place] is an 73 y.o. female.  -Clinician reviewed note by Charlann Lange, PA.  Patient is a resident at [facility] skilled nursing facility who is sent to the ED today under IVC, placed by her nurse out of concern for paranoid behavior, and her stating she wanted to kill herself. Per staff at [facility], there is no documented history of dementia. Refusal to take medications started around the 11th, unclear if this was continuous or sporadic. Elopement attempt first noted on the 14th (Sept). The nurse providing the information states she has noticed a steady decline in the patient's mental health since these occurrences. Per IVC petition, the patient has not had anything to eat or drink in 7 days. The patient states she does not eat or drink anything from the facility because she feels they are trying to kill her. She further states she is uncomfortable here because she is sure this is not [facility] as she is told it is.  Patient had tried twice to leave [facility].  She says she does not want to kill herself.  However IVC papers say she has said that.  Patient admits to not eating or drinking in the last 7 days.  She has also not been taking her medications either.    Pt denies any HI or A/V hallucinations.  Patient said that she does not think that she is actually at [facility].  She does not know where she is at but she is sure that people are trying to kill her.  Patient says that she does not trust the staff at [facility].  Patient has a flat affect.  She does not appear to be responding to internal stimuli.  Patient denies having poor memory and does not entertain that she may be incorrect.  Patient is seen by [facility] for psychiatry.  She was at [facility] in 07/2018.  She was at  [facility] in [facility].  She had had an overdose attempt at that time.  -Clinician did talk to Lindon Romp, FNP about patient.  He said that she meets geropsych placement criteria.  Clinician informed Charlann Lange, PA of disposition.  She is ordering a COVID test.  Diagnosis: F33.3 MDD recurrent severe w/ psychotic features  Past Medical History:  Past Medical History:  Diagnosis Date  . Anxiety   . Arthritis    rhematoid,osteoarthritis  . Diabetes mellitus without complication ([place])   . Elevated cholesterol   . Hemorrhoids   . Hypertension   . Hypothyroidism   . Joint pain   . Nocturia   . Sleeping difficulties     Past Surgical History:  Procedure Laterality Date  . ABDOMINAL HYSTERECTOMY    . BACK SURGERY    . CERVICAL FUSION  2004  . CHOLECYSTECTOMY    . EYE SURGERY Left    cataract  . PARS PLANA REPAIR OF RETINAL DEATACHMENT    . THYROIDECTOMY, PARTIAL      Family History:  Family History  Problem Relation Age of Onset  . Diabetes Mother   . Breast cancer Mother   . Gout Mother   . Alzheimer's disease Father   . Heart Problems Father   . Breast cancer Sister   . Alzheimer's disease Sister   . Diabetes Sister   . Gout Sister   . Heart  Problems Sister   . Alzheimer's disease Brother   . Diabetes Brother   . Heart Problems Brother     Social History:  reports that she has never smoked. She has never used smokeless tobacco. She reports that she does not drink alcohol or use drugs.  Additional Social History:  Alcohol / Drug Use Pain Medications: See PTA medication list Prescriptions: See PTA medication list Over the Counter: See PTA medication list History of alcohol / drug use?: No history of alcohol / drug abuse  CIWA: CIWA-Ar BP: 132/80 Pulse Rate: 76 COWS:    Allergies:  Allergies  Allergen Reactions  . Contrast Media [Iodinated Diagnostic Agents] Anaphylaxis and Hives    Only the retina dye;   . Abilify [Aripiprazole]     Pt dos not recall  reaction  . Ultram [Tramadol Hcl] Other (See Comments)    unknown  . Zithromax [Azithromycin]     Anxiety heightened    Home Medications: (Not in a hospital admission)   OB/GYN Status:  No LMP recorded. Patient is postmenopausal.  General Assessment Data Location of Assessment: [facility] TTS Assessment: In system Is this a Tele or Face-to-Face Assessment?: Tele Assessment Is this an Initial Assessment or a Re-assessment for this encounter?: Initial Assessment Patient Accompanied by:: N/A Language Other than English: No Living Arrangements: In Assisted Living/Nursing Home (Comment: Name of Nursing Home([facility]) What gender do you identify as?: Female Marital status: Single Pregnancy Status: No Living Arrangements: Other (Comment)([facility] assisted living) Can pt return to current living arrangement?: Yes Admission Status: Involuntary Petitioner: Other(Caregiver) Is patient capable of signing voluntary admission?: No Referral Source: Other(Assisted living) Insurance type: [facility]     Crisis Care Plan Living Arrangements: Other (Comment)([facility] assisted living) Name of Psychiatrist: [facility] Name of Therapist: unknown  Education Status Is patient currently in school?: No Is the patient employed, unemployed or receiving disability?: (Patient is retired.)  Risk to self with the past 6 months Suicidal Ideation: Yes-Currently Present Has patient been a risk to self within the past 6 months prior to admission? : No Suicidal Intent: No Has patient had any suicidal intent within the past 6 months prior to admission? : No Is patient at risk for suicide?: Yes Suicidal Plan?: No Has patient had any suicidal plan within the past 6 months prior to admission? : No Access to Means: No What has been your use of drugs/alcohol within the last 12 months?: Denies Previous Attempts/Gestures: Yes How many times?: 1 Other Self Harm Risks: Not taking  meds Triggers for Past Attempts: Unpredictable Intentional Self Injurious Behavior: None Family Suicide History: Unknown Recent stressful life event(s): Other (Comment)(None reported.) Persecutory voices/beliefs?: Yes Depression: Yes Depression Symptoms: Despondent, Isolating, Loss of interest in usual pleasures, Feeling worthless/self pity Substance abuse history and/or treatment for substance abuse?: No Suicide prevention information given to non-admitted patients: Not applicable  Risk to Others within the past 6 months Homicidal Ideation: No Does patient have any lifetime risk of violence toward others beyond the six months prior to admission? : No Thoughts of Harm to Others: No Current Homicidal Intent: No Current Homicidal Plan: No Access to Homicidal Means: No Identified Victim: No one History of harm to others?: No Assessment of Violence: None Noted Violent Behavior Description: Unknown Does patient have access to weapons?: No Criminal Charges Pending?: No Does patient have a court date: No Is patient on probation?: No  Psychosis Hallucinations: None noted Delusions: Persecutory(Pt thinks people are trying to kill her.)  Mental Status Report Appearance/Hygiene: Disheveled Eye Contact: Fair Motor Activity: Freedom of movement, Unremarkable Speech: Logical/coherent Level of Consciousness: Alert Mood: Anxious, Apprehensive, Sad Affect: Blunted Anxiety Level: Severe Thought Processes: Irrelevant Judgement: Impaired Orientation: Person Obsessive Compulsive Thoughts/Behaviors: None  Cognitive Functioning Concentration: Poor Memory: Recent Impaired, Remote Impaired Is patient IDD: No Insight: Poor Impulse Control: Poor Appetite: Poor Have you had any weight changes? : Loss(Unknown) Amount of the weight change? (lbs): (Unknown) Sleep: Decreased Total Hours of Sleep: (<4H/D) Vegetative Symptoms: None  ADLScreening [facility] Assessment Services) Patient's cognitive  ability adequate to safely complete daily activities?: Yes Patient able to express need for assistance with ADLs?: Yes Independently performs ADLs?: Yes (appropriate for developmental age)  Prior Inpatient Therapy Prior Inpatient Therapy: Yes Prior Therapy Dates: 07/2018; 05/2018 Prior Therapy Facilty/Provider(s): [facility]; [place] Reason for Treatment: SI, delusional thinking  Prior Outpatient Therapy Prior Outpatient Therapy: Yes Prior Therapy Dates: Current Prior Therapy Facilty/Provider(s): [facility] Reason for Treatment: med management Does patient have an ACCT team?: No Does patient have Intensive In-House Services?  : No Does patient have [facility] services? : No Does patient have P4CC services?: No  ADL Screening (condition at time of admission) Patient's cognitive ability adequate to safely complete daily activities?: Yes Is the patient deaf or have difficulty hearing?: No Does the patient have difficulty seeing, even when wearing glasses/contacts?: No Does the patient have difficulty concentrating, remembering, or making decisions?: No Patient able to express need for assistance with ADLs?: Yes Does the patient have difficulty dressing or bathing?: No Independently performs ADLs?: Yes (appropriate for developmental age) Does the patient have difficulty walking or climbing stairs?: No Weakness of Legs: None(Knees get weak at times.) Weakness of Arms/Hands: None       Abuse/Neglect Assessment (Assessment to be complete while patient is alone) Abuse/Neglect Assessment Can Be Completed: Yes Physical Abuse: Denies Verbal Abuse: Denies Sexual Abuse: Denies Exploitation of patient/patient's resources: Denies Self-Neglect: Denies     Regulatory affairs officer (For Healthcare) Does Patient Have a Medical Advance Directive?: No Would patient like information on creating a medical advance directive?: No - Patient declined          Disposition:   Disposition Initial Assessment Completed for this Encounter: Yes Patient referred to: Other (Comment)(Pt to be referred out to [facility] psych facilities)  This service was provided via telemedicine using a 2-way, interactive audio and Radiographer, therapeutic.  Names of all persons participating in this telemedicine service and their role in this encounter. Name: [name] Role: patient  Name: Curlene Dolphin, M.S. LCAS QP Role: clinician  Name:  Role:   Name:  Role:     Raymondo Band 04/05/2019 1:23 AM

## 2019-04-05 NOTE — BH Assessment (Signed)
[facility] Assessment Progress Note  Per Buford Dresser, DO, this pt requires psychiatric hospitalization at this time.  Pt presents under IVC initiated by pt's nurse, which Rico Sheehan, [facility] has upheld.  At 15:42 Gerald Stabs calls from [facility] to report that pt has been accepted to their facility by Dr Caren Hazy to the [facility] [place] [facility], Rm 145.  Letitia Libra, FNP concurs with this decision.  Pt's nurse, Nena Jordan, has been notified, and agrees to call report to [phone].  Pt is to be transported via [facility].  [facility] stipulates that pt must arrive either before 23:00 tonight, or after 06:00 tomorrow.    [place] Coordinator [phone]

## 2019-04-05 NOTE — ED Notes (Signed)
Pt refused x-ray earlier and is now refusing EKG.  She states she just doesn't want to .  She does not want her lunch tray either.

## 2019-04-05 NOTE — ED Notes (Signed)
Patient was medicated for transport as she had been so agitated she tried twice to run.  Her heart rate was 125 at that time and she could not hold still.  PRN helped her relax enough to get stable vital signs and transport without difficulty.

## 2019-04-05 NOTE — ED Notes (Signed)
I attempted to collect vital signs on the patient and was unsuccessful

## 2019-04-06 LAB — URINE CULTURE: Culture: <10000 — AB

## 2019-04-12 ENCOUNTER — Ambulatory Visit: Payer: Medicare Other | Admitting: Adult Health

## 2019-04-17 DIAGNOSIS — R4189 Other symptoms and signs involving cognitive functions and awareness: Secondary | ICD-10-CM | POA: Insufficient documentation

## 2020-06-05 ENCOUNTER — Other Ambulatory Visit: Payer: Self-pay | Admitting: Family Medicine

## 2020-06-05 DIAGNOSIS — Z1231 Encounter for screening mammogram for malignant neoplasm of breast: Secondary | ICD-10-CM

## 2020-06-05 DIAGNOSIS — E2839 Other primary ovarian failure: Secondary | ICD-10-CM

## 2020-08-23 DIAGNOSIS — R6889 Other general symptoms and signs: Secondary | ICD-10-CM | POA: Diagnosis not present

## 2020-08-23 DIAGNOSIS — R310 Gross hematuria: Secondary | ICD-10-CM | POA: Diagnosis not present

## 2020-09-10 DIAGNOSIS — R6889 Other general symptoms and signs: Secondary | ICD-10-CM | POA: Diagnosis not present

## 2020-09-10 DIAGNOSIS — R310 Gross hematuria: Secondary | ICD-10-CM | POA: Diagnosis not present

## 2020-09-10 DIAGNOSIS — N3289 Other specified disorders of bladder: Secondary | ICD-10-CM | POA: Diagnosis not present

## 2020-09-14 ENCOUNTER — Other Ambulatory Visit: Payer: Medicare Other

## 2020-09-14 ENCOUNTER — Ambulatory Visit: Payer: Medicare Other

## 2020-09-15 ENCOUNTER — Other Ambulatory Visit: Payer: Medicare Other

## 2020-09-15 ENCOUNTER — Inpatient Hospital Stay: Admission: RE | Admit: 2020-09-15 | Payer: Medicare Other | Source: Ambulatory Visit

## 2020-09-19 DIAGNOSIS — R310 Gross hematuria: Secondary | ICD-10-CM | POA: Diagnosis not present

## 2020-09-19 DIAGNOSIS — D4102 Neoplasm of uncertain behavior of left kidney: Secondary | ICD-10-CM | POA: Diagnosis not present

## 2020-09-22 IMAGING — CR DG CHEST 2V
2 series · 2 of 2 positions shown · non-contrast
Comparison: 06/01/2018, 05/24/2018

CLINICAL DATA: 72-year-old female with hypertension

EXAM:
CHEST - 2 VIEW

[w chest pa]
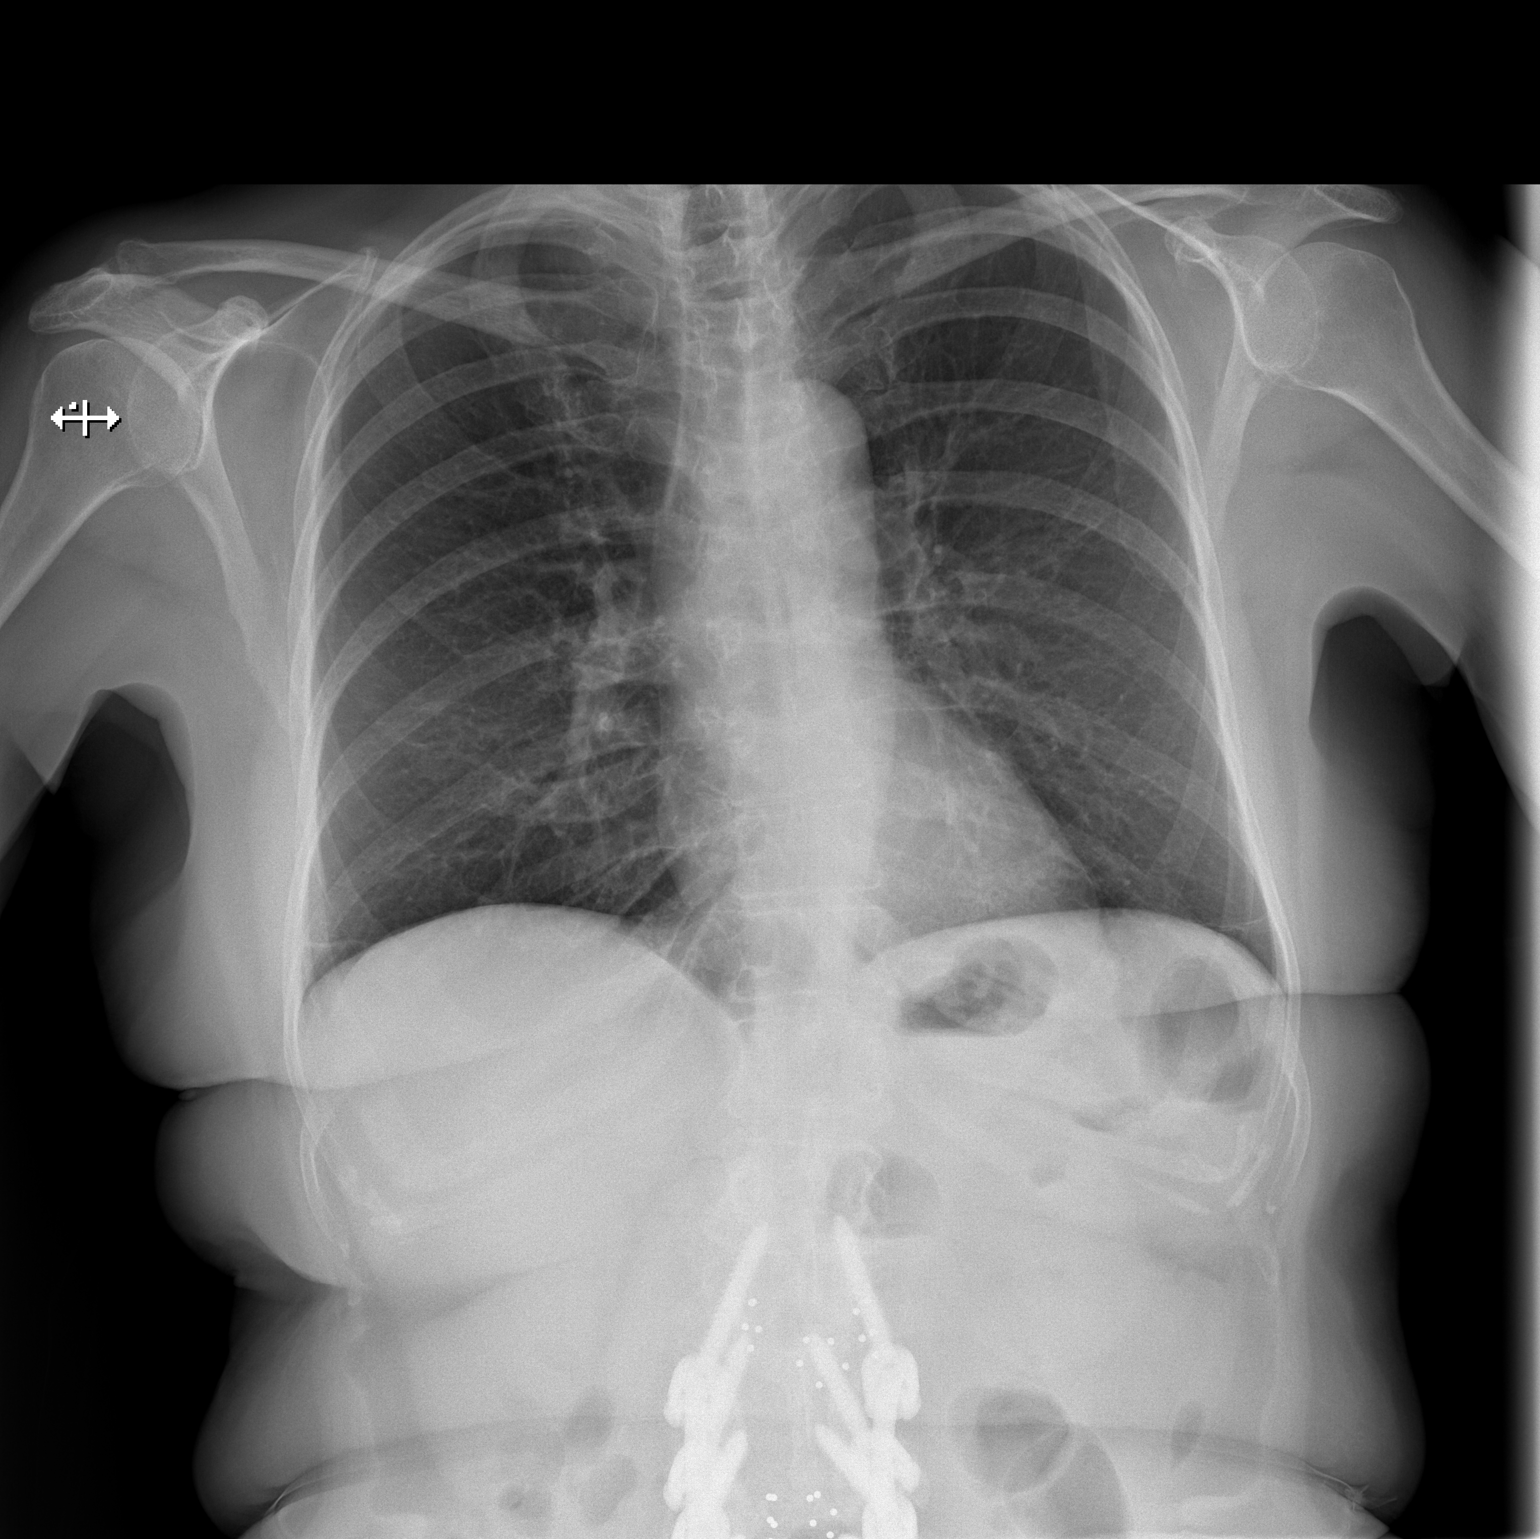

[w chest lat]
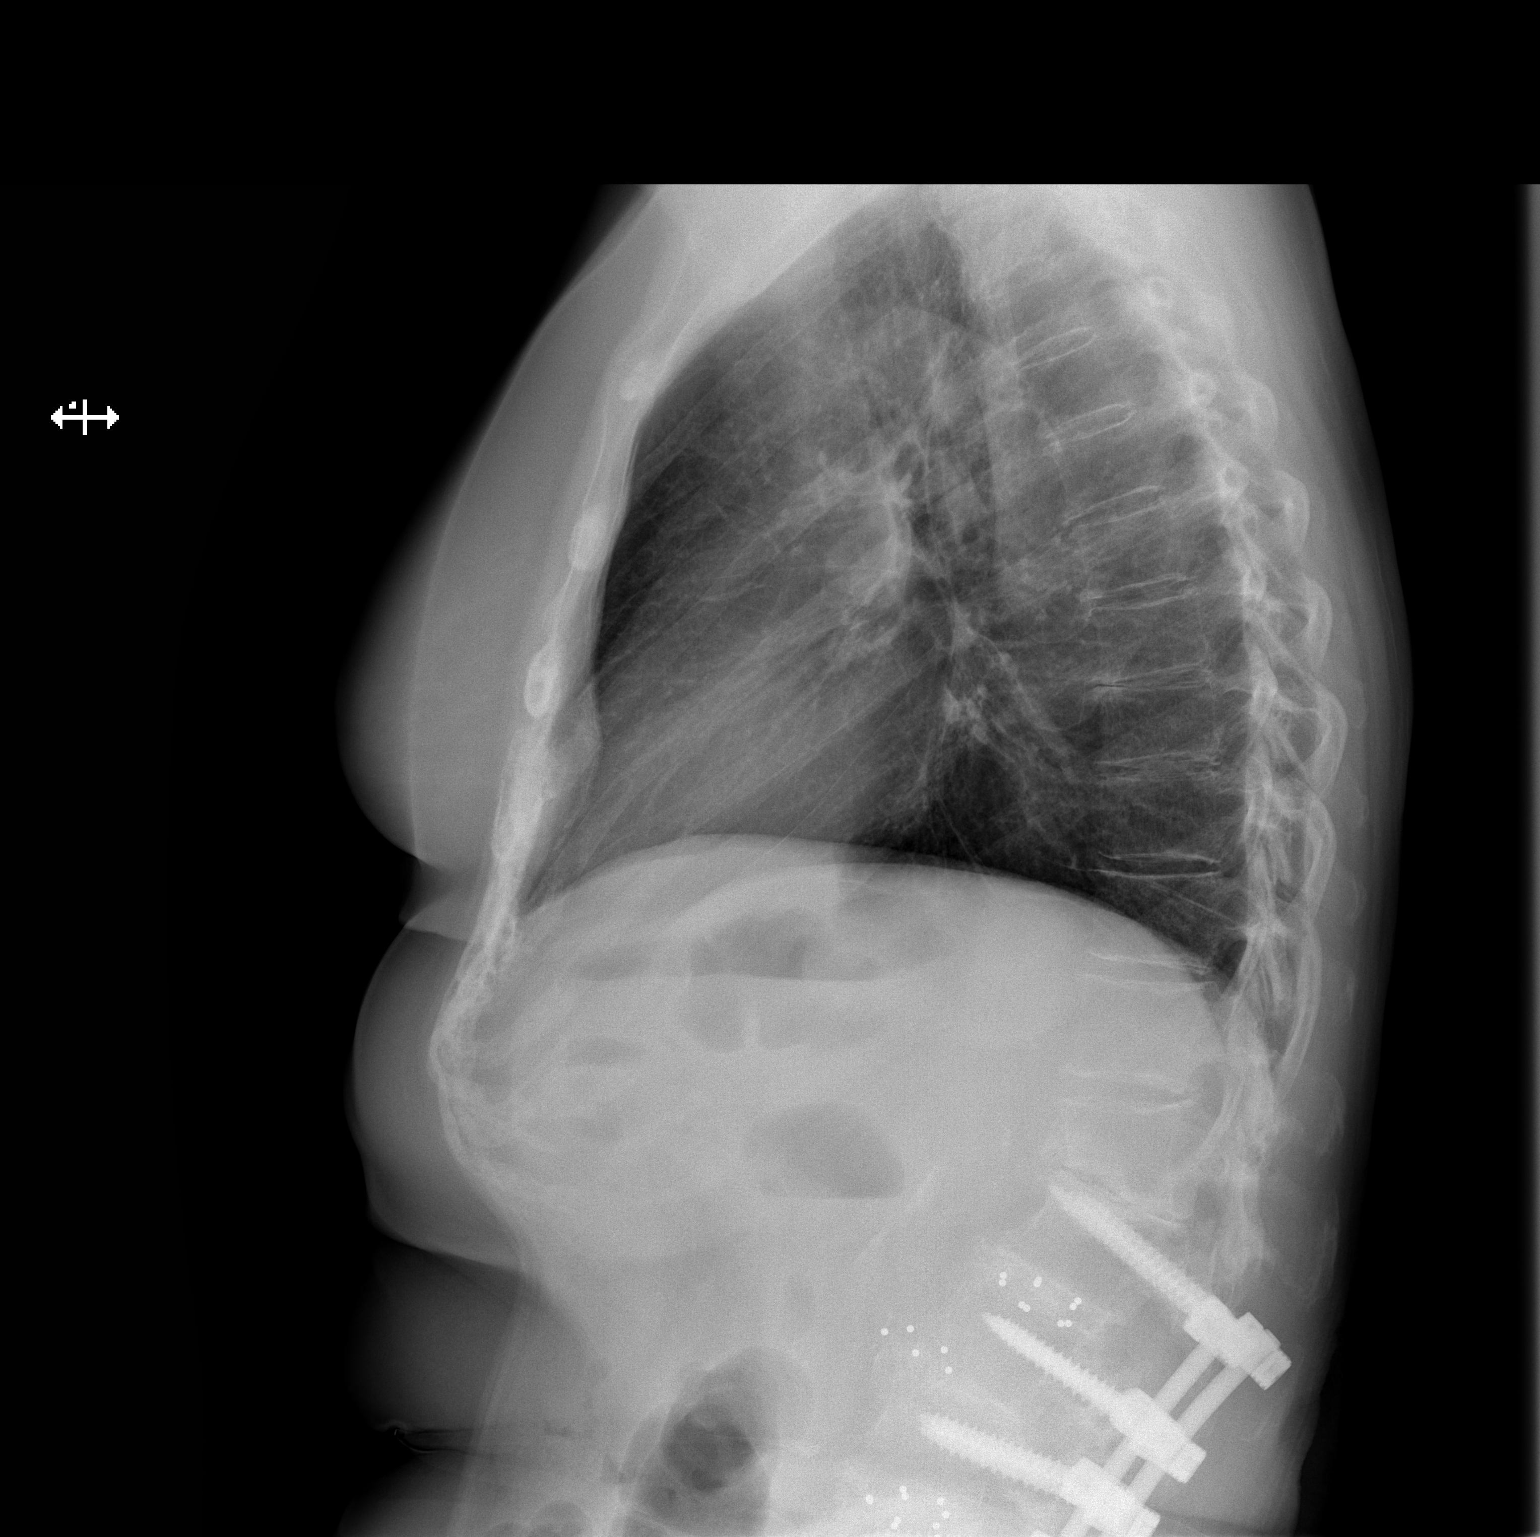

[2 of 2 positions shown; findings below may reference images not displayed]

FINDINGS: Cardiomediastinal silhouette unchanged in size and contour. No
evidence of central vascular congestion. No interlobular septal
thickening. No pneumothorax or pleural effusion. No confluent
airspace disease.

Surgical changes of the cervical region incompletely imaged.
Surgical changes of the lumbar spine incompletely imaged.

No displaced fracture
IMPRESSION: Negative for acute cardiopulmonary disease

## 2020-09-24 DIAGNOSIS — Z01812 Encounter for preprocedural laboratory examination: Secondary | ICD-10-CM | POA: Diagnosis not present

## 2020-09-24 DIAGNOSIS — R6889 Other general symptoms and signs: Secondary | ICD-10-CM | POA: Diagnosis not present

## 2020-09-27 DIAGNOSIS — K5520 Angiodysplasia of colon without hemorrhage: Secondary | ICD-10-CM | POA: Diagnosis not present

## 2020-09-27 DIAGNOSIS — Z86010 Personal history of colonic polyps: Secondary | ICD-10-CM | POA: Diagnosis not present

## 2020-09-27 DIAGNOSIS — K640 First degree hemorrhoids: Secondary | ICD-10-CM | POA: Diagnosis not present

## 2020-09-27 DIAGNOSIS — K621 Rectal polyp: Secondary | ICD-10-CM | POA: Diagnosis not present

## 2020-10-02 DIAGNOSIS — K621 Rectal polyp: Secondary | ICD-10-CM | POA: Diagnosis not present

## 2020-10-03 DIAGNOSIS — N19 Unspecified kidney failure: Secondary | ICD-10-CM | POA: Diagnosis not present

## 2020-10-03 DIAGNOSIS — D3002 Benign neoplasm of left kidney: Secondary | ICD-10-CM | POA: Diagnosis not present

## 2020-10-03 DIAGNOSIS — D4102 Neoplasm of uncertain behavior of left kidney: Secondary | ICD-10-CM | POA: Diagnosis not present

## 2020-11-06 ENCOUNTER — Other Ambulatory Visit: Payer: Self-pay | Admitting: Urology

## 2020-11-08 ENCOUNTER — Ambulatory Visit
Admission: RE | Admit: 2020-11-08 | Discharge: 2020-11-08 | Disposition: A | Discharge status: Home / Self Care | Payer: Medicare Other | Source: Ambulatory Visit | Attending: Family Medicine | Admitting: Family Medicine

## 2020-11-08 ENCOUNTER — Other Ambulatory Visit: Payer: Self-pay

## 2020-11-08 DIAGNOSIS — Z1231 Encounter for screening mammogram for malignant neoplasm of breast: Secondary | ICD-10-CM

## 2020-11-12 DIAGNOSIS — N2889 Other specified disorders of kidney and ureter: Secondary | ICD-10-CM | POA: Diagnosis not present

## 2020-11-12 DIAGNOSIS — E7800 Pure hypercholesterolemia, unspecified: Secondary | ICD-10-CM | POA: Diagnosis not present

## 2020-11-12 DIAGNOSIS — R7303 Prediabetes: Secondary | ICD-10-CM | POA: Diagnosis not present

## 2020-11-12 DIAGNOSIS — M069 Rheumatoid arthritis, unspecified: Secondary | ICD-10-CM | POA: Diagnosis not present

## 2020-11-12 DIAGNOSIS — I10 Essential (primary) hypertension: Secondary | ICD-10-CM | POA: Diagnosis not present

## 2020-12-12 NOTE — Patient Instructions (Addendum)
DUE TO COVID-19 ONLY ONE VISITOR IS ALLOWED TO COME WITH YOU AND STAY IN THE WAITING ROOM ONLY DURING PRE OP AND PROCEDURE DAY OF SURGERY. THE 2 VISITORS  MAY VISIT WITH YOU AFTER SURGERY IN YOUR PRIVATE ROOM DURING VISITING HOURS ONLY!  YOU NEED TO HAVE A COVID 19 TEST ON___6/13____ @_1 :30 pm___, THIS TEST MUST BE DONE BEFORE SURGERY,  COVID TESTING SITE [place] [id number], IT IS ON THE RIGHT GOING OUT [address] APPROXIMATELY  2 MINUTES PAST [employer] ON THE RIGHT. ONCE YOUR COVID TEST IS COMPLETED,  PLEASE BEGIN THE QUARANTINE INSTRUCTIONS AS OUTLINED IN YOUR HANDOUT.                [name]    Your procedure is scheduled on: 12/26/20   Report to [facility] Main  Entrance   Report to admitting at  10:30 AM     Call this number if you have problems the morning of surgery [phone]    Remember: Do not eat food after Midnight.  You may have clear liquids until  9:30 AM   BRUSH YOUR TEETH MORNING OF SURGERY AND RINSE YOUR MOUTH OUT, NO CHEWING GUM CANDY OR MINTS.     Take these medicines the morning of surgery with A SIP OF WATER: Lexapro, Metoprolol,Prednisone                                 You may not have any metal on your body including hair pins and              piercings  Do not wear jewelry, make-up, lotions, powders or perfumes, deodorant             Do not wear nail polish on your fingernails.  Do not shave  48 hours prior to surgery.     Do not bring valuables to the hospital. [place].  Contacts, dentures or bridgework may not be worn into surgery.                [facility] - Preparing for Surgery Before surgery, you can play an important role.  Because skin is not sterile, your skin needs to be as free of germs as possible.  You can reduce the number of germs on your skin by washing with CHG (chlorahexidine gluconate) soap before surgery.  CHG is an  antiseptic cleaner which kills germs and bonds with the skin to continue killing germs even after washing. Please DO NOT use if you have an allergy to CHG or antibacterial soaps.  If your skin becomes reddened/irritated stop using the CHG and inform your nurse when you arrive at Short Stay. Do not shave (including legs and underarms) for at least 48 hours prior to the first CHG shower.    Please follow these instructions carefully:  1.  Shower with CHG Soap the night before surgery and the  morning of Surgery.  2.  If you choose to wash your hair, wash your hair first as usual with your  normal  shampoo.  3.  After you shampoo, rinse your hair and body thoroughly to remove the  shampoo.  4.  Use CHG as you would any other liquid soap.  You can apply chg directly  to the skin and wash                       Gently with a scrungie or clean washcloth.  5.  Apply the CHG Soap to your body ONLY FROM THE NECK DOWN.   Do not use on face/ open                           Wound or open sores. Avoid contact with eyes, ears mouth and genitals (private parts).                       Wash face,  Genitals (private parts) with your normal soap.             6.  Wash thoroughly, paying special attention to the area where your surgery  will be performed.  7.  Thoroughly rinse your body with warm water from the neck down.  8.  DO NOT shower/wash with your normal soap after using and rinsing off  the CHG Soap.             9.  Pat yourself dry with a clean towel.            10.  Wear clean pajamas.            11.  Place clean sheets on your bed the night of your first shower and do not  sleep with pets. Day of Surgery : Do not apply any lotions/deodorants the morning of surgery.  Please wear clean clothes to the hospital/surgery center.  FAILURE TO FOLLOW THESE INSTRUCTIONS MAY RESULT IN THE CANCELLATION OF YOUR SURGERY PATIENT SIGNATURE_________________________________  NURSE  SIGNATURE__________________________________  ________________________________________________________________________   Adam Phenix  An incentive spirometer is a tool that can help keep your lungs clear and active. This tool measures how well you are filling your lungs with each breath. Taking long deep breaths may help reverse or decrease the chance of developing breathing (pulmonary) problems (especially infection) following:  A long period of time when you are unable to move or be active. BEFORE THE PROCEDURE   If the spirometer includes an indicator to show your best effort, your nurse or respiratory therapist will set it to a desired goal.  If possible, sit up straight or lean slightly forward. Try not to slouch.  Hold the incentive spirometer in an upright position. INSTRUCTIONS FOR USE  1. Sit on the edge of your bed if possible, or sit up as far as you can in bed or on a chair. 2. Hold the incentive spirometer in an upright position. 3. Breathe out normally. 4. Place the mouthpiece in your mouth and seal your lips tightly around it. 5. Breathe in slowly and as deeply as possible, raising the piston or the ball toward the top of the column. 6. Hold your breath for 3-5 seconds or for as long as possible. Allow the piston or ball to fall to the bottom of the column. 7. Remove the mouthpiece from your mouth and breathe out normally. 8. Rest for a few seconds and repeat Steps 1 through 7 at least 10 times every 1-2 hours when you are awake. Take your time and take a few normal breaths between deep breaths. 9. The spirometer may include an indicator to show your best effort.  Use the indicator as a goal to work toward during each repetition. 10. After each set of 10 deep breaths, practice coughing to be sure your lungs are clear. If you have an incision (the cut made at the time of surgery), support your incision when coughing by placing a pillow or rolled up towels firmly  against it. Once you are able to get out of bed, walk around indoors and cough well. You may stop using the incentive spirometer when instructed by your caregiver.  RISKS AND COMPLICATIONS  Take your time so you do not get dizzy or light-headed.  If you are in pain, you may need to take or ask for pain medication before doing incentive spirometry. It is harder to take a deep breath if you are having pain. AFTER USE  Rest and breathe slowly and easily.  It can be helpful to keep track of a log of your progress. Your caregiver can provide you with a simple table to help with this. If you are using the spirometer at home, follow these instructions: [place] IF:   You are having difficultly using the spirometer.  You have trouble using the spirometer as often as instructed.  Your pain medication is not giving enough relief while using the spirometer.  You develop fever of 100.5 F (38.1 C) or higher. SEEK IMMEDIATE MEDICAL CARE IF:   You cough up bloody sputum that had not been present before.  You develop fever of 102 F (38.9 C) or greater.  You develop worsening pain at or near the incision site. MAKE SURE YOU:   Understand these instructions.  Will watch your condition.  Will get help right away if you are not doing well or get worse. Document Released: 11/10/2006 Document Revised: 09/22/2011 Document Reviewed: 01/11/2007 ExitCare Patient Information 2014 ExitCare, [place].   ________________________________________________________________________             CLEAR LIQUID DIET   Foods Allowed                                                                     Foods Excluded  Coffee and tea, regular and decaf                             liquids that you cannot  Plain Jell-O any favor except red or purple                                           see through such as: Fruit ices (not with fruit pulp)                                     milk, soups, orange  juice  Iced Popsicles                                    All solid food Carbonated beverages, regular and diet  Cranberry, grape and apple juices Sports drinks like Gatorade Lightly seasoned clear broth or consume(fat free) Sugar, honey syrup  Sample Menu Breakfast                                Lunch                                     Supper Cranberry juice                    Beef broth                            Chicken broth Jell-O                                     Grape juice                           Apple juice Coffee or tea                        Jell-O                                      Popsicle                                                Coffee or tea                        Coffee or tea  _____________________________________________________________________ ________________________________________________________________________  WHAT IS A BLOOD TRANSFUSION? Blood Transfusion Information  A transfusion is the replacement of blood or some of its parts. Blood is made up of multiple cells which provide different functions.  Red blood cells carry oxygen and are used for blood loss replacement.  White blood cells fight against infection.  Platelets control bleeding.  Plasma helps clot blood.  Other blood products are available for specialized needs, such as hemophilia or other clotting disorders. BEFORE THE TRANSFUSION  Who gives blood for transfusions?   Healthy volunteers who are fully evaluated to make sure their blood is safe. This is blood bank blood. Transfusion therapy is the safest it has ever been in the practice of medicine. Before blood is taken from a donor, a complete history is taken to make sure that person has no history of diseases nor engages in risky social behavior (examples are intravenous drug use or sexual activity with multiple partners). The donor's travel history is screened to minimize risk of transmitting  infections, such as malaria. The donated blood is tested for signs of infectious diseases, such as HIV and hepatitis. The blood is then tested to be sure it is compatible with you in order to minimize the chance of a transfusion reaction. If you or a relative donates blood, this is often done in anticipation of surgery and is not appropriate for emergency situations. It takes many days to process the donated blood.  RISKS AND COMPLICATIONS Although transfusion therapy is very safe and saves many lives, the main dangers of transfusion include:   Getting an infectious disease.  Developing a transfusion reaction. This is an allergic reaction to something in the blood you were given. Every precaution is taken to prevent this. The decision to have a blood transfusion has been considered carefully by your caregiver before blood is given. Blood is not given unless the benefits outweigh the risks. AFTER THE TRANSFUSION  Right after receiving a blood transfusion, you will usually feel much better and more energetic. This is especially true if your red blood cells have gotten low (anemic). The transfusion raises the level of the red blood cells which carry oxygen, and this usually causes an energy increase.  The nurse administering the transfusion will monitor you carefully for complications. HOME CARE INSTRUCTIONS  No special instructions are needed after a transfusion. You may find your energy is better. Speak with your caregiver about any limitations on activity for underlying diseases you may have. SEEK MEDICAL CARE IF:   Your condition is not improving after your transfusion.  You develop redness or irritation at the intravenous (IV) site. SEEK IMMEDIATE MEDICAL CARE IF:  Any of the following symptoms occur over the next 12 hours:  Shaking chills.  You have a temperature by mouth above 102 F (38.9 C), not controlled by medicine.  Chest, back, or muscle pain.  People around you feel you are  not acting correctly or are confused.  Shortness of breath or difficulty breathing.  Dizziness and fainting.  You get a rash or develop hives.  You have a decrease in urine output.  Your urine turns a dark color or changes to pink, red, or brown. Any of the following symptoms occur over the next 10 days:  You have a temperature by mouth above 102 F (38.9 C), not controlled by medicine.  Shortness of breath.  Weakness after normal activity.  The white part of the eye turns yellow (jaundice).  You have a decrease in the amount of urine or are urinating less often.  Your urine turns a dark color or changes to pink, red, or brown. Document Released: 06/27/2000 Document Revised: 09/22/2011 Document Reviewed: 02/14/2008 [facility] Patient Information 2014 [place].

## 2020-12-18 DIAGNOSIS — C652 Malignant neoplasm of left renal pelvis: Secondary | ICD-10-CM | POA: Diagnosis not present

## 2020-12-19 ENCOUNTER — Encounter (HOSPITAL_COMMUNITY): Payer: Self-pay

## 2020-12-19 ENCOUNTER — Encounter (HOSPITAL_COMMUNITY)
Admission: RE | Admit: 2020-12-19 | Discharge: 2020-12-19 | Disposition: A | Discharge status: Home / Self Care | Payer: Medicare Other | Source: Ambulatory Visit | Attending: Urology | Admitting: Urology

## 2020-12-19 ENCOUNTER — Other Ambulatory Visit: Payer: Self-pay

## 2020-12-19 DIAGNOSIS — E119 Type 2 diabetes mellitus without complications: Secondary | ICD-10-CM

## 2020-12-19 DIAGNOSIS — Z01818 Encounter for other preprocedural examination: Secondary | ICD-10-CM | POA: Diagnosis not present

## 2020-12-19 DIAGNOSIS — M069 Rheumatoid arthritis, unspecified: Secondary | ICD-10-CM | POA: Diagnosis present

## 2020-12-19 DIAGNOSIS — F333 Major depressive disorder, recurrent, severe with psychotic symptoms: Secondary | ICD-10-CM | POA: Diagnosis present

## 2020-12-19 DIAGNOSIS — E785 Hyperlipidemia, unspecified: Secondary | ICD-10-CM | POA: Diagnosis present

## 2020-12-19 DIAGNOSIS — N2889 Other specified disorders of kidney and ureter: Secondary | ICD-10-CM | POA: Diagnosis present

## 2020-12-19 DIAGNOSIS — I10 Essential (primary) hypertension: Secondary | ICD-10-CM | POA: Diagnosis present

## 2020-12-19 DIAGNOSIS — M48062 Spinal stenosis, lumbar region with neurogenic claudication: Secondary | ICD-10-CM | POA: Diagnosis present

## 2020-12-19 DIAGNOSIS — J9601 Acute respiratory failure with hypoxia: Secondary | ICD-10-CM

## 2020-12-19 HISTORY — DX: Dyspnea, unspecified: R06.00

## 2020-12-19 LAB — BASIC METABOLIC PANEL
Anion gap: 9 (ref 5–15)
BUN: 19 mg/dL (ref 8–23)
CO2: 23 mmol/L (ref 22–32)
Calcium: 8.7 mg/dL — ABNORMAL LOW (ref 8.9–10.3)
Chloride: 110 mmol/L (ref 98–111)
Creatinine, Ser: 0.88 mg/dL (ref 0.44–1.00)
GFR, Estimated: >60 mL/min (ref >60)
Glucose, Bld: 111 mg/dL — ABNORMAL HIGH (ref 70–99)
Potassium: 4.5 mmol/L (ref 3.5–5.1)
Sodium: 142 mmol/L (ref 135–145)

## 2020-12-19 LAB — CBC
HCT: 36 % (ref 36.0–46.0)
Hemoglobin: 11.2 g/dL — ABNORMAL LOW (ref 12.0–15.0)
MCH: 30.7 pg (ref 26.0–34.0)
MCHC: 31.1 g/dL (ref 30.0–36.0)
MCV: 98.6 fL (ref 80.0–100.0)
Platelets: 145 10*3/uL — ABNORMAL LOW (ref 150–400)
RBC: 3.65 MIL/uL — ABNORMAL LOW (ref 3.87–5.11)
RDW: 15.6 % — ABNORMAL HIGH (ref 11.5–15.5)
WBC: 7.3 10*3/uL (ref 4.0–10.5)
nRBC: 0 % (ref 0.0–0.2)

## 2020-12-19 LAB — PROTIME-INR
INR: 1 (ref 0.8–1.2)
Prothrombin Time: 12.9 seconds (ref 11.4–15.2)

## 2020-12-19 LAB — APTT: aPTT: 30 seconds (ref 24–36)

## 2020-12-19 LAB — GLUCOSE, CAPILLARY: Glucose-Capillary: 111 mg/dL — ABNORMAL HIGH (ref 70–99)

## 2020-12-19 NOTE — Progress Notes (Signed)
EKG preformed today 12/19/2020  Pt is type 2 diabetic  Capillary glucose today 12/19/2020 111  Patient lives in group home  Patient states has SOB with increased exertion; Denies any fever, cough or chest pain.  Patient verbalized understanding of instructions that were given to them at the PAT appointment. Patient was also instructed that they will need to review over the PAT instructions again at home before surgery.

## 2020-12-20 LAB — HEMOGLOBIN A1C
Hgb A1c MFr Bld: 6 % — ABNORMAL HIGH (ref 4.8–5.6)
Mean Plasma Glucose: 126 mg/dL

## 2020-12-24 ENCOUNTER — Other Ambulatory Visit (HOSPITAL_COMMUNITY)
Admission: RE | Admit: 2020-12-24 | Discharge: 2020-12-24 | Disposition: A | Discharge status: Home / Self Care | Payer: Medicare Other | Source: Ambulatory Visit | Attending: Urology | Admitting: Urology

## 2020-12-24 DIAGNOSIS — J90 Pleural effusion, not elsewhere classified: Secondary | ICD-10-CM | POA: Diagnosis not present

## 2020-12-24 DIAGNOSIS — F32A Depression, unspecified: Secondary | ICD-10-CM | POA: Diagnosis not present

## 2020-12-24 DIAGNOSIS — J95821 Acute postprocedural respiratory failure: Secondary | ICD-10-CM | POA: Diagnosis not present

## 2020-12-24 DIAGNOSIS — E7800 Pure hypercholesterolemia, unspecified: Secondary | ICD-10-CM | POA: Diagnosis not present

## 2020-12-24 DIAGNOSIS — N179 Acute kidney failure, unspecified: Secondary | ICD-10-CM | POA: Diagnosis not present

## 2020-12-24 DIAGNOSIS — M6281 Muscle weakness (generalized): Secondary | ICD-10-CM | POA: Diagnosis not present

## 2020-12-24 DIAGNOSIS — Z20822 Contact with and (suspected) exposure to covid-19: Secondary | ICD-10-CM | POA: Diagnosis not present

## 2020-12-24 DIAGNOSIS — I10 Essential (primary) hypertension: Secondary | ICD-10-CM | POA: Diagnosis not present

## 2020-12-24 DIAGNOSIS — Z01812 Encounter for preprocedural laboratory examination: Secondary | ICD-10-CM | POA: Insufficient documentation

## 2020-12-24 DIAGNOSIS — D6489 Other specified anemias: Secondary | ICD-10-CM | POA: Diagnosis not present

## 2020-12-24 DIAGNOSIS — E039 Hypothyroidism, unspecified: Secondary | ICD-10-CM | POA: Diagnosis not present

## 2020-12-24 DIAGNOSIS — M48062 Spinal stenosis, lumbar region with neurogenic claudication: Secondary | ICD-10-CM | POA: Diagnosis not present

## 2020-12-24 DIAGNOSIS — F419 Anxiety disorder, unspecified: Secondary | ICD-10-CM | POA: Diagnosis present

## 2020-12-24 DIAGNOSIS — R278 Other lack of coordination: Secondary | ICD-10-CM | POA: Diagnosis not present

## 2020-12-24 DIAGNOSIS — Z9289 Personal history of other medical treatment: Secondary | ICD-10-CM | POA: Diagnosis not present

## 2020-12-24 DIAGNOSIS — Z981 Arthrodesis status: Secondary | ICD-10-CM | POA: Diagnosis not present

## 2020-12-24 DIAGNOSIS — D0919 Carcinoma in situ of other urinary organs: Secondary | ICD-10-CM | POA: Diagnosis not present

## 2020-12-24 DIAGNOSIS — Z743 Need for continuous supervision: Secondary | ICD-10-CM | POA: Diagnosis not present

## 2020-12-24 DIAGNOSIS — R2689 Other abnormalities of gait and mobility: Secondary | ICD-10-CM | POA: Diagnosis not present

## 2020-12-24 DIAGNOSIS — M069 Rheumatoid arthritis, unspecified: Secondary | ICD-10-CM | POA: Diagnosis not present

## 2020-12-24 DIAGNOSIS — J9690 Respiratory failure, unspecified, unspecified whether with hypoxia or hypercapnia: Secondary | ICD-10-CM | POA: Diagnosis not present

## 2020-12-24 DIAGNOSIS — E876 Hypokalemia: Secondary | ICD-10-CM | POA: Diagnosis not present

## 2020-12-24 DIAGNOSIS — R21 Rash and other nonspecific skin eruption: Secondary | ICD-10-CM | POA: Diagnosis not present

## 2020-12-24 DIAGNOSIS — M7989 Other specified soft tissue disorders: Secondary | ICD-10-CM | POA: Diagnosis not present

## 2020-12-24 DIAGNOSIS — C652 Malignant neoplasm of left renal pelvis: Secondary | ICD-10-CM | POA: Diagnosis not present

## 2020-12-24 DIAGNOSIS — R0603 Acute respiratory distress: Secondary | ICD-10-CM | POA: Diagnosis not present

## 2020-12-24 DIAGNOSIS — R0902 Hypoxemia: Secondary | ICD-10-CM | POA: Diagnosis not present

## 2020-12-24 DIAGNOSIS — Y658 Other specified misadventures during surgical and medical care: Secondary | ICD-10-CM | POA: Diagnosis not present

## 2020-12-24 DIAGNOSIS — J9811 Atelectasis: Secondary | ICD-10-CM | POA: Diagnosis not present

## 2020-12-24 DIAGNOSIS — R404 Transient alteration of awareness: Secondary | ICD-10-CM | POA: Diagnosis not present

## 2020-12-24 DIAGNOSIS — D649 Anemia, unspecified: Secondary | ICD-10-CM | POA: Diagnosis not present

## 2020-12-24 DIAGNOSIS — E1165 Type 2 diabetes mellitus with hyperglycemia: Secondary | ICD-10-CM | POA: Diagnosis not present

## 2020-12-24 DIAGNOSIS — J9601 Acute respiratory failure with hypoxia: Secondary | ICD-10-CM | POA: Diagnosis not present

## 2020-12-24 DIAGNOSIS — G4733 Obstructive sleep apnea (adult) (pediatric): Secondary | ICD-10-CM | POA: Diagnosis not present

## 2020-12-24 DIAGNOSIS — J9621 Acute and chronic respiratory failure with hypoxia: Secondary | ICD-10-CM | POA: Diagnosis not present

## 2020-12-24 DIAGNOSIS — R6889 Other general symptoms and signs: Secondary | ICD-10-CM | POA: Diagnosis not present

## 2020-12-24 DIAGNOSIS — E119 Type 2 diabetes mellitus without complications: Secondary | ICD-10-CM | POA: Diagnosis not present

## 2020-12-24 DIAGNOSIS — D3012 Benign neoplasm of left renal pelvis: Secondary | ICD-10-CM | POA: Diagnosis not present

## 2020-12-24 DIAGNOSIS — E669 Obesity, unspecified: Secondary | ICD-10-CM | POA: Diagnosis not present

## 2020-12-24 DIAGNOSIS — Z79899 Other long term (current) drug therapy: Secondary | ICD-10-CM | POA: Diagnosis not present

## 2020-12-24 DIAGNOSIS — N2889 Other specified disorders of kidney and ureter: Secondary | ICD-10-CM | POA: Diagnosis not present

## 2020-12-24 DIAGNOSIS — E785 Hyperlipidemia, unspecified: Secondary | ICD-10-CM | POA: Diagnosis not present

## 2020-12-24 DIAGNOSIS — R5381 Other malaise: Secondary | ICD-10-CM | POA: Diagnosis not present

## 2020-12-24 DIAGNOSIS — D7812 Accidental puncture and laceration of the spleen during other procedure: Secondary | ICD-10-CM | POA: Diagnosis not present

## 2020-12-24 DIAGNOSIS — Z90710 Acquired absence of both cervix and uterus: Secondary | ICD-10-CM | POA: Diagnosis not present

## 2020-12-24 DIAGNOSIS — Z833 Family history of diabetes mellitus: Secondary | ICD-10-CM | POA: Diagnosis not present

## 2020-12-24 DIAGNOSIS — R109 Unspecified abdominal pain: Secondary | ICD-10-CM | POA: Diagnosis not present

## 2020-12-24 DIAGNOSIS — Z7982 Long term (current) use of aspirin: Secondary | ICD-10-CM | POA: Diagnosis not present

## 2020-12-24 LAB — SARS CORONAVIRUS 2 (TAT 6-24 HRS): SARS Coronavirus 2: NEGATIVE

## 2020-12-26 ENCOUNTER — Encounter (HOSPITAL_COMMUNITY): Payer: Self-pay | Admitting: Urology

## 2020-12-26 ENCOUNTER — Inpatient Hospital Stay (HOSPITAL_COMMUNITY): Payer: Medicare Other

## 2020-12-26 ENCOUNTER — Inpatient Hospital Stay (HOSPITAL_COMMUNITY): Payer: Medicare Other | Admitting: Certified Registered"

## 2020-12-26 ENCOUNTER — Inpatient Hospital Stay (HOSPITAL_COMMUNITY)
Admission: RE | Admit: 2020-12-26 | Discharge: 2021-01-01 | DRG: 656 | Disposition: A | Discharge status: Skilled Nursing Facility | Payer: Medicare Other | Attending: Urology | Admitting: Urology

## 2020-12-26 ENCOUNTER — Encounter (HOSPITAL_COMMUNITY)
Admission: RE | Disposition: A | Discharge status: Skilled Nursing Facility | Payer: Self-pay | Source: Home / Self Care | Attending: Urology

## 2020-12-26 DIAGNOSIS — M7989 Other specified soft tissue disorders: Secondary | ICD-10-CM

## 2020-12-26 DIAGNOSIS — J9690 Respiratory failure, unspecified, unspecified whether with hypoxia or hypercapnia: Secondary | ICD-10-CM

## 2020-12-26 DIAGNOSIS — E669 Obesity, unspecified: Secondary | ICD-10-CM | POA: Diagnosis present

## 2020-12-26 DIAGNOSIS — F419 Anxiety disorder, unspecified: Secondary | ICD-10-CM | POA: Diagnosis present

## 2020-12-26 DIAGNOSIS — E7800 Pure hypercholesterolemia, unspecified: Secondary | ICD-10-CM | POA: Diagnosis present

## 2020-12-26 DIAGNOSIS — Z20822 Contact with and (suspected) exposure to covid-19: Secondary | ICD-10-CM | POA: Diagnosis present

## 2020-12-26 DIAGNOSIS — Z833 Family history of diabetes mellitus: Secondary | ICD-10-CM

## 2020-12-26 DIAGNOSIS — D7812 Accidental puncture and laceration of the spleen during other procedure: Secondary | ICD-10-CM | POA: Diagnosis not present

## 2020-12-26 DIAGNOSIS — Z79899 Other long term (current) drug therapy: Secondary | ICD-10-CM

## 2020-12-26 DIAGNOSIS — Z90710 Acquired absence of both cervix and uterus: Secondary | ICD-10-CM | POA: Diagnosis not present

## 2020-12-26 DIAGNOSIS — F32A Depression, unspecified: Secondary | ICD-10-CM | POA: Diagnosis present

## 2020-12-26 DIAGNOSIS — I10 Essential (primary) hypertension: Secondary | ICD-10-CM | POA: Diagnosis present

## 2020-12-26 DIAGNOSIS — J9601 Acute respiratory failure with hypoxia: Secondary | ICD-10-CM | POA: Diagnosis not present

## 2020-12-26 DIAGNOSIS — J95821 Acute postprocedural respiratory failure: Secondary | ICD-10-CM | POA: Diagnosis not present

## 2020-12-26 DIAGNOSIS — Z91041 Radiographic dye allergy status: Secondary | ICD-10-CM

## 2020-12-26 DIAGNOSIS — D6489 Other specified anemias: Secondary | ICD-10-CM | POA: Diagnosis not present

## 2020-12-26 DIAGNOSIS — Y658 Other specified misadventures during surgical and medical care: Secondary | ICD-10-CM | POA: Diagnosis not present

## 2020-12-26 DIAGNOSIS — N179 Acute kidney failure, unspecified: Secondary | ICD-10-CM | POA: Diagnosis present

## 2020-12-26 DIAGNOSIS — E039 Hypothyroidism, unspecified: Secondary | ICD-10-CM | POA: Diagnosis present

## 2020-12-26 DIAGNOSIS — Z7982 Long term (current) use of aspirin: Secondary | ICD-10-CM

## 2020-12-26 DIAGNOSIS — E785 Hyperlipidemia, unspecified: Secondary | ICD-10-CM | POA: Diagnosis present

## 2020-12-26 DIAGNOSIS — Z9289 Personal history of other medical treatment: Secondary | ICD-10-CM | POA: Diagnosis not present

## 2020-12-26 DIAGNOSIS — G4733 Obstructive sleep apnea (adult) (pediatric): Secondary | ICD-10-CM | POA: Diagnosis present

## 2020-12-26 DIAGNOSIS — E876 Hypokalemia: Secondary | ICD-10-CM | POA: Diagnosis present

## 2020-12-26 DIAGNOSIS — R1310 Dysphagia, unspecified: Secondary | ICD-10-CM | POA: Diagnosis present

## 2020-12-26 DIAGNOSIS — M069 Rheumatoid arthritis, unspecified: Secondary | ICD-10-CM | POA: Diagnosis present

## 2020-12-26 DIAGNOSIS — R0603 Acute respiratory distress: Secondary | ICD-10-CM | POA: Diagnosis not present

## 2020-12-26 DIAGNOSIS — J9621 Acute and chronic respiratory failure with hypoxia: Secondary | ICD-10-CM | POA: Diagnosis not present

## 2020-12-26 DIAGNOSIS — E1165 Type 2 diabetes mellitus with hyperglycemia: Secondary | ICD-10-CM | POA: Diagnosis present

## 2020-12-26 DIAGNOSIS — Z981 Arthrodesis status: Secondary | ICD-10-CM

## 2020-12-26 DIAGNOSIS — N2889 Other specified disorders of kidney and ureter: Secondary | ICD-10-CM | POA: Diagnosis present

## 2020-12-26 DIAGNOSIS — C652 Malignant neoplasm of left renal pelvis: Secondary | ICD-10-CM | POA: Diagnosis present

## 2020-12-26 DIAGNOSIS — J9620 Acute and chronic respiratory failure, unspecified whether with hypoxia or hypercapnia: Secondary | ICD-10-CM

## 2020-12-26 DIAGNOSIS — Z7952 Long term (current) use of systemic steroids: Secondary | ICD-10-CM

## 2020-12-26 HISTORY — PX: ROBOT ASSITED LAPAROSCOPIC NEPHROURETERECTOMY: SHX6077

## 2020-12-26 LAB — CBC WITH DIFFERENTIAL/PLATELET
Abs Immature Granulocytes: 0.08 10*3/uL — ABNORMAL HIGH (ref 0.00–0.07)
Basophils Absolute: 0.1 10*3/uL (ref 0.0–0.1)
Basophils Relative: 1 %
Eosinophils Absolute: 0 10*3/uL (ref 0.0–0.5)
Eosinophils Relative: 0 %
HCT: 38 % (ref 36.0–46.0)
Hemoglobin: 11.7 g/dL — ABNORMAL LOW (ref 12.0–15.0)
Immature Granulocytes: 1 %
Lymphocytes Relative: 9 %
Lymphs Abs: 1 10*3/uL (ref 0.7–4.0)
MCH: 30.9 pg (ref 26.0–34.0)
MCHC: 30.8 g/dL (ref 30.0–36.0)
MCV: 100.3 fL — ABNORMAL HIGH (ref 80.0–100.0)
Monocytes Absolute: 0.4 10*3/uL (ref 0.1–1.0)
Monocytes Relative: 3 %
Neutro Abs: 10.1 10*3/uL — ABNORMAL HIGH (ref 1.7–7.7)
Neutrophils Relative %: 86 %
Platelets: 159 10*3/uL (ref 150–400)
RBC: 3.79 MIL/uL — ABNORMAL LOW (ref 3.87–5.11)
RDW: 15.6 % — ABNORMAL HIGH (ref 11.5–15.5)
WBC: 11.7 10*3/uL — ABNORMAL HIGH (ref 4.0–10.5)
nRBC: 0 % (ref 0.0–0.2)

## 2020-12-26 LAB — GLUCOSE, CAPILLARY
Glucose-Capillary: 100 mg/dL — ABNORMAL HIGH (ref 70–99)
Glucose-Capillary: 156 mg/dL — ABNORMAL HIGH (ref 70–99)
Glucose-Capillary: 153 mg/dL — ABNORMAL HIGH (ref 70–99)
Glucose-Capillary: 164 mg/dL — ABNORMAL HIGH (ref 70–99)
Glucose-Capillary: 175 mg/dL — ABNORMAL HIGH (ref 70–99)

## 2020-12-26 LAB — LACTIC ACID, PLASMA: Lactic Acid, Venous: 2.6 mmol/L (ref 0.5–1.9)

## 2020-12-26 LAB — COMPREHENSIVE METABOLIC PANEL
ALT: 28 U/L (ref 0–44)
AST: 36 U/L (ref 15–41)
Albumin: 3.4 g/dL — ABNORMAL LOW (ref 3.5–5.0)
Alkaline Phosphatase: 96 U/L (ref 38–126)
Anion gap: 9 (ref 5–15)
BUN: 10 mg/dL (ref 8–23)
CO2: 25 mmol/L (ref 22–32)
Calcium: 8.1 mg/dL — ABNORMAL LOW (ref 8.9–10.3)
Chloride: 105 mmol/L (ref 98–111)
Creatinine, Ser: 1.26 mg/dL — ABNORMAL HIGH (ref 0.44–1.00)
GFR, Estimated: 45 mL/min — ABNORMAL LOW (ref >60)
Glucose, Bld: 200 mg/dL — ABNORMAL HIGH (ref 70–99)
Potassium: 4.6 mmol/L (ref 3.5–5.1)
Sodium: 139 mmol/L (ref 135–145)
Total Bilirubin: 0.6 mg/dL (ref 0.3–1.2)
Total Protein: 6.7 g/dL (ref 6.5–8.1)

## 2020-12-26 LAB — MAGNESIUM: Magnesium: 1.9 mg/dL (ref 1.7–2.4)

## 2020-12-26 LAB — HEMOGLOBIN AND HEMATOCRIT, BLOOD
HCT: 40.2 % (ref 36.0–46.0)
Hemoglobin: 12.2 g/dL (ref 12.0–15.0)

## 2020-12-26 LAB — TYPE AND SCREEN
ABO/RH(D): O POS
ABO/RH(D): O POS
Antibody Screen: NEGATIVE
Antibody Screen: NEGATIVE

## 2020-12-26 LAB — BLOOD GAS, ARTERIAL
Acid-base deficit: 1.2 mmol/L (ref 0.0–2.0)
Bicarbonate: 22.7 mmol/L (ref 20.0–28.0)
FIO2: 100
O2 Saturation: 97.4 %
Patient temperature: 98.6
pCO2 arterial: 37 mmHg (ref 32.0–48.0)
pH, Arterial: 7.404 (ref 7.350–7.450)
pO2, Arterial: 114 mmHg — ABNORMAL HIGH (ref 83.0–108.0)

## 2020-12-26 LAB — PROTIME-INR
INR: 1 (ref 0.8–1.2)
Prothrombin Time: 13 seconds (ref 11.4–15.2)

## 2020-12-26 LAB — MRSA PCR SCREENING: MRSA by PCR: NEGATIVE

## 2020-12-26 LAB — PHOSPHORUS: Phosphorus: 3.2 mg/dL (ref 2.5–4.6)

## 2020-12-26 LAB — BRAIN NATRIURETIC PEPTIDE: B Natriuretic Peptide: 91.2 pg/mL (ref 0.0–100.0)

## 2020-12-26 SURGERY — NEPHROURETERECTOMY, ROBOT-ASSISTED, LAPAROSCOPIC
Anesthesia: General | Laterality: Left

## 2020-12-26 MED ORDER — ROCURONIUM BROMIDE 10 MG/ML (PF) SYRINGE
PREFILLED_SYRINGE | INTRAVENOUS | Status: AC
  Filled 2020-12-26: qty 10

## 2020-12-26 MED ORDER — SODIUM CHLORIDE (PF) 0.9 % IJ SOLN
INTRAMUSCULAR | Status: AC
  Filled 2020-12-26: qty 20

## 2020-12-26 MED ORDER — DEXTROSE-NACL 5-0.45 % IV SOLN: INTRAVENOUS | Status: DC

## 2020-12-26 MED ORDER — ORAL CARE MOUTH RINSE
15.0000 mL | Freq: Once | OROMUCOSAL | Status: AC
  Administered 2020-12-26: 15 mL via OROMUCOSAL

## 2020-12-26 MED ORDER — ESCITALOPRAM OXALATE 20 MG PO TABS
10.0000 mg | ORAL_TABLET | Freq: Every day | ORAL | Status: DC
  Administered 2020-12-27: 10 mg
  Filled 2020-12-26: qty 1

## 2020-12-26 MED ORDER — DOCUSATE SODIUM 100 MG PO CAPS: 100.0000 mg | ORAL_CAPSULE | Freq: Two times a day (BID) | ORAL | Status: DC | PRN

## 2020-12-26 MED ORDER — MIDAZOLAM HCL 2 MG/2ML IJ SOLN: 1.0000 mg | Freq: Once | INTRAMUSCULAR | Status: DC

## 2020-12-26 MED ORDER — SODIUM CHLORIDE (PF) 0.9 % IJ SOLN
INTRAMUSCULAR | Status: DC | PRN
  Administered 2020-12-26: 10 mL

## 2020-12-26 MED ORDER — ACETAMINOPHEN 10 MG/ML IV SOLN
1000.0000 mg | Freq: Four times a day (QID) | INTRAVENOUS | Status: DC
  Administered 2020-12-26 – 2020-12-27 (×3): 1000 mg via INTRAVENOUS
  Filled 2020-12-26 (×4): qty 100

## 2020-12-26 MED ORDER — ONDANSETRON HCL 4 MG/2ML IJ SOLN
INTRAMUSCULAR | Status: AC
  Filled 2020-12-26: qty 2

## 2020-12-26 MED ORDER — BENZTROPINE MESYLATE 0.5 MG PO TABS
0.5000 mg | ORAL_TABLET | Freq: Two times a day (BID) | ORAL | Status: DC
  Administered 2020-12-27: 0.5 mg
  Filled 2020-12-26: qty 1

## 2020-12-26 MED ORDER — ALBUMIN HUMAN 5 % IV SOLN
INTRAVENOUS | Status: AC
  Administered 2020-12-26: 12.5 g
  Filled 2020-12-26: qty 250

## 2020-12-26 MED ORDER — OLANZAPINE 2.5 MG PO TABS
2.5000 mg | ORAL_TABLET | Freq: Every day | ORAL | Status: DC
  Administered 2020-12-27: 2.5 mg
  Filled 2020-12-26: qty 1

## 2020-12-26 MED ORDER — ESMOLOL HCL 100 MG/10ML IV SOLN
INTRAVENOUS | Status: DC | PRN
  Administered 2020-12-26: 30 mg via INTRAVENOUS

## 2020-12-26 MED ORDER — HYDROXYCHLOROQUINE SULFATE 200 MG PO TABS
400.0000 mg | ORAL_TABLET | Freq: Every day | ORAL | Status: DC
  Administered 2020-12-27: 400 mg
  Filled 2020-12-26: qty 2

## 2020-12-26 MED ORDER — STERILE WATER FOR IRRIGATION IR SOLN
Status: DC | PRN
  Administered 2020-12-26: 1000 mL

## 2020-12-26 MED ORDER — ALBUTEROL SULFATE (2.5 MG/3ML) 0.083% IN NEBU: 2.5000 mg | INHALATION_SOLUTION | Freq: Four times a day (QID) | RESPIRATORY_TRACT | Status: DC | PRN

## 2020-12-26 MED ORDER — ALBUTEROL SULFATE HFA 108 (90 BASE) MCG/ACT IN AERS
INHALATION_SPRAY | RESPIRATORY_TRACT | Status: AC
  Filled 2020-12-26: qty 6.7

## 2020-12-26 MED ORDER — DEXMEDETOMIDINE (PRECEDEX) IN NS 20 MCG/5ML (4 MCG/ML) IV SYRINGE
PREFILLED_SYRINGE | INTRAVENOUS | Status: DC | PRN
  Administered 2020-12-26: 12 ug via INTRAVENOUS
  Administered 2020-12-26: 8 ug via INTRAVENOUS

## 2020-12-26 MED ORDER — ALBUMIN HUMAN 5 % IV SOLN: 12.5000 g | Freq: Once | INTRAVENOUS | Status: DC

## 2020-12-26 MED ORDER — ROCURONIUM BROMIDE 100 MG/10ML IV SOLN
INTRAVENOUS | Status: DC | PRN
  Administered 2020-12-26: 20 mg via INTRAVENOUS
  Administered 2020-12-26: 40 mg via INTRAVENOUS
  Administered 2020-12-26 (×2): 20 mg via INTRAVENOUS
  Administered 2020-12-26: 60 mg via INTRAVENOUS

## 2020-12-26 MED ORDER — FUROSEMIDE 10 MG/ML IJ SOLN
INTRAMUSCULAR | Status: AC
  Administered 2020-12-26: 20 mg via INTRAVENOUS
  Filled 2020-12-26: qty 2

## 2020-12-26 MED ORDER — LEFLUNOMIDE 20 MG PO TABS
20.0000 mg | ORAL_TABLET | Freq: Every day | ORAL | Status: DC
  Filled 2020-12-26: qty 1

## 2020-12-26 MED ORDER — CEFAZOLIN SODIUM-DEXTROSE 2-4 GM/100ML-% IV SOLN
2.0000 g | Freq: Once | INTRAVENOUS | Status: AC
  Administered 2020-12-26: 2 g via INTRAVENOUS
  Filled 2020-12-26: qty 100

## 2020-12-26 MED ORDER — DOCUSATE SODIUM 50 MG/5ML PO LIQD
100.0000 mg | Freq: Two times a day (BID) | ORAL | Status: DC
  Filled 2020-12-26: qty 10

## 2020-12-26 MED ORDER — HYDROMORPHONE HCL 1 MG/ML IJ SOLN
0.5000 mg | INTRAMUSCULAR | Status: DC | PRN
Start: 2020-12-26 — End: 2020-12-26

## 2020-12-26 MED ORDER — DIPHENHYDRAMINE HCL 50 MG/ML IJ SOLN: 12.5000 mg | Freq: Four times a day (QID) | INTRAMUSCULAR | Status: DC | PRN

## 2020-12-26 MED ORDER — LACTATED RINGERS IR SOLN
Status: DC | PRN
  Administered 2020-12-26: 1000 mL

## 2020-12-26 MED ORDER — ACETAMINOPHEN 10 MG/ML IV SOLN: 1000.0000 mg | Freq: Once | INTRAVENOUS | Status: DC | PRN

## 2020-12-26 MED ORDER — PROPOFOL 10 MG/ML IV BOLUS
INTRAVENOUS | Status: AC
  Filled 2020-12-26: qty 20

## 2020-12-26 MED ORDER — HYDROCODONE-ACETAMINOPHEN 5-325 MG PO TABS: 1.0000 | ORAL_TABLET | Freq: Four times a day (QID) | ORAL | 0 refills | Status: DC | PRN

## 2020-12-26 MED ORDER — CHLORHEXIDINE GLUCONATE CLOTH 2 % EX PADS
6.0000 | MEDICATED_PAD | Freq: Every day | CUTANEOUS | Status: DC
  Administered 2020-12-26 – 2020-12-31 (×6): 6 via TOPICAL

## 2020-12-26 MED ORDER — LACTATED RINGERS IV SOLN: INTRAVENOUS | Status: DC

## 2020-12-26 MED ORDER — CHLORHEXIDINE GLUCONATE 0.12% ORAL RINSE (MEDLINE KIT)
15.0000 mL | Freq: Two times a day (BID) | OROMUCOSAL | Status: DC
  Administered 2020-12-26 – 2021-01-01 (×10): 15 mL via OROMUCOSAL

## 2020-12-26 MED ORDER — OXYCODONE HCL 5 MG/5ML PO SOLN: 5.0000 mg | ORAL | Status: DC | PRN

## 2020-12-26 MED ORDER — DEXAMETHASONE SODIUM PHOSPHATE 10 MG/ML IJ SOLN
INTRAMUSCULAR | Status: DC | PRN
  Administered 2020-12-26: 5 mg via INTRAVENOUS

## 2020-12-26 MED ORDER — SUGAMMADEX SODIUM 200 MG/2ML IV SOLN
INTRAVENOUS | Status: DC | PRN
  Administered 2020-12-26: 200 mg via INTRAVENOUS

## 2020-12-26 MED ORDER — FENTANYL CITRATE (PF) 100 MCG/2ML IJ SOLN
INTRAMUSCULAR | Status: AC
  Filled 2020-12-26: qty 2

## 2020-12-26 MED ORDER — ORAL CARE MOUTH RINSE
15.0000 mL | OROMUCOSAL | Status: DC
  Administered 2020-12-26 – 2020-12-27 (×6): 15 mL via OROMUCOSAL

## 2020-12-26 MED ORDER — FENTANYL CITRATE (PF) 100 MCG/2ML IJ SOLN
25.0000 ug | INTRAMUSCULAR | Status: DC | PRN
  Administered 2020-12-26: 50 ug via INTRAVENOUS

## 2020-12-26 MED ORDER — POLYETHYLENE GLYCOL 3350 17 G PO PACK: 17.0000 g | PACK | Freq: Every day | ORAL | Status: DC | PRN

## 2020-12-26 MED ORDER — MIDAZOLAM HCL 2 MG/2ML IJ SOLN
INTRAMUSCULAR | Status: AC
  Filled 2020-12-26: qty 2

## 2020-12-26 MED ORDER — CEFAZOLIN SODIUM-DEXTROSE 1-4 GM/50ML-% IV SOLN
1.0000 g | Freq: Three times a day (TID) | INTRAVENOUS | Status: DC
  Filled 2020-12-26 (×2): qty 50

## 2020-12-26 MED ORDER — BELLADONNA ALKALOIDS-OPIUM 16.2-60 MG RE SUPP: 1.0000 | Freq: Four times a day (QID) | RECTAL | Status: DC | PRN

## 2020-12-26 MED ORDER — FENTANYL CITRATE (PF) 100 MCG/2ML IJ SOLN
25.0000 ug | INTRAMUSCULAR | Status: DC | PRN
Start: 2020-12-26 — End: 2020-12-27
  Administered 2020-12-27 (×2): 25 ug via INTRAVENOUS
  Filled 2020-12-26 (×2): qty 2

## 2020-12-26 MED ORDER — LIDOCAINE 2% (20 MG/ML) 5 ML SYRINGE
INTRAMUSCULAR | Status: AC
  Filled 2020-12-26: qty 5

## 2020-12-26 MED ORDER — LEVOFLOXACIN IN D5W 750 MG/150ML IV SOLN
750.0000 mg | INTRAVENOUS | Status: DC
  Administered 2020-12-26: 750 mg via INTRAVENOUS
  Filled 2020-12-26: qty 150

## 2020-12-26 MED ORDER — LIDOCAINE 2% (20 MG/ML) 5 ML SYRINGE
INTRAMUSCULAR | Status: DC | PRN
  Administered 2020-12-26: 100 mg via INTRAVENOUS

## 2020-12-26 MED ORDER — BUPIVACAINE LIPOSOME 1.3 % IJ SUSP
20.0000 mL | Freq: Once | INTRAMUSCULAR | Status: AC
  Administered 2020-12-26: 20 mL
  Filled 2020-12-26: qty 20

## 2020-12-26 MED ORDER — SODIUM CHLORIDE 0.9 % IV SOLN
2.0000 g | INTRAVENOUS | Status: DC
  Administered 2020-12-26: 2 g via INTRAVENOUS
  Filled 2020-12-26: qty 20

## 2020-12-26 MED ORDER — FUROSEMIDE 10 MG/ML IJ SOLN: 20.0000 mg | Freq: Once | INTRAMUSCULAR | Status: AC

## 2020-12-26 MED ORDER — FENTANYL CITRATE (PF) 250 MCG/5ML IJ SOLN
INTRAMUSCULAR | Status: AC
  Filled 2020-12-26: qty 5

## 2020-12-26 MED ORDER — CHLORHEXIDINE GLUCONATE 0.12 % MT SOLN: 15.0000 mL | Freq: Once | OROMUCOSAL | Status: AC

## 2020-12-26 MED ORDER — PROPOFOL 1000 MG/100ML IV EMUL
5.0000 ug/kg/min | INTRAVENOUS | Status: DC
  Administered 2020-12-26: 40 ug/kg/min via INTRAVENOUS
  Administered 2020-12-26: 25 ug/kg/min via INTRAVENOUS
  Administered 2020-12-27: 40 ug/kg/min via INTRAVENOUS
  Filled 2020-12-26 (×3): qty 100

## 2020-12-26 MED ORDER — ALBUTEROL SULFATE (2.5 MG/3ML) 0.083% IN NEBU
INHALATION_SOLUTION | RESPIRATORY_TRACT | Status: AC
  Administered 2020-12-26: 2.5 mg via RESPIRATORY_TRACT
  Filled 2020-12-26: qty 3

## 2020-12-26 MED ORDER — PREDNISONE 5 MG PO TABS
5.0000 mg | ORAL_TABLET | Freq: Every day | ORAL | Status: DC
  Administered 2020-12-27 – 2021-01-01 (×6): 5 mg
  Filled 2020-12-26 (×6): qty 1

## 2020-12-26 MED ORDER — ONDANSETRON HCL 4 MG/2ML IJ SOLN
INTRAMUSCULAR | Status: DC | PRN
  Administered 2020-12-26: 4 mg via INTRAVENOUS

## 2020-12-26 MED ORDER — POLYETHYLENE GLYCOL 3350 17 G PO PACK: 17.0000 g | PACK | Freq: Every day | ORAL | Status: DC

## 2020-12-26 MED ORDER — PROPOFOL 1000 MG/100ML IV EMUL
5.0000 ug/kg/min | INTRAVENOUS | Status: DC
  Administered 2020-12-26: 25 ug/kg/min via INTRAVENOUS
  Filled 2020-12-26: qty 100

## 2020-12-26 MED ORDER — ONDANSETRON HCL 4 MG/2ML IJ SOLN: 4.0000 mg | Freq: Once | INTRAMUSCULAR | Status: DC | PRN

## 2020-12-26 MED ORDER — FENTANYL CITRATE (PF) 100 MCG/2ML IJ SOLN
25.0000 ug | INTRAMUSCULAR | Status: DC | PRN
  Administered 2020-12-26: 100 ug via INTRAVENOUS
  Administered 2020-12-27 (×2): 50 ug via INTRAVENOUS
  Filled 2020-12-26 (×3): qty 2

## 2020-12-26 MED ORDER — DIPHENHYDRAMINE HCL 12.5 MG/5ML PO ELIX: 12.5000 mg | ORAL_SOLUTION | Freq: Four times a day (QID) | ORAL | Status: DC | PRN

## 2020-12-26 MED ORDER — PROPOFOL 10 MG/ML IV BOLUS
INTRAVENOUS | Status: DC | PRN
  Administered 2020-12-26: 150 mg via INTRAVENOUS
  Administered 2020-12-26: 4 mg via INTRAVENOUS

## 2020-12-26 MED ORDER — DEXAMETHASONE SODIUM PHOSPHATE 10 MG/ML IJ SOLN
INTRAMUSCULAR | Status: AC
  Filled 2020-12-26: qty 1

## 2020-12-26 MED ORDER — PANTOPRAZOLE SODIUM 40 MG IV SOLR
40.0000 mg | Freq: Every day | INTRAVENOUS | Status: DC
  Administered 2020-12-26 – 2020-12-29 (×4): 40 mg via INTRAVENOUS
  Filled 2020-12-26 (×4): qty 40

## 2020-12-26 MED ORDER — ONDANSETRON HCL 4 MG/2ML IJ SOLN: 4.0000 mg | INTRAMUSCULAR | Status: DC | PRN

## 2020-12-26 MED ORDER — DOCUSATE SODIUM 100 MG PO CAPS: 100.0000 mg | ORAL_CAPSULE | Freq: Two times a day (BID) | ORAL | Status: DC

## 2020-12-26 MED ORDER — LACTATED RINGERS IV SOLN: INTRAVENOUS | Status: DC | PRN

## 2020-12-26 MED ORDER — FENTANYL CITRATE (PF) 100 MCG/2ML IJ SOLN
INTRAMUSCULAR | Status: AC
  Administered 2020-12-26: 50 ug
  Filled 2020-12-26: qty 2

## 2020-12-26 MED ORDER — PROMETHAZINE HCL 12.5 MG PO TABS: 12.5000 mg | ORAL_TABLET | ORAL | 0 refills | Status: DC | PRN

## 2020-12-26 MED ORDER — FENTANYL CITRATE (PF) 100 MCG/2ML IJ SOLN
INTRAMUSCULAR | Status: DC | PRN
  Administered 2020-12-26: 25 ug via INTRAVENOUS
  Administered 2020-12-26 (×3): 50 ug via INTRAVENOUS
  Administered 2020-12-26: 25 ug via INTRAVENOUS
  Administered 2020-12-26: 50 ug via INTRAVENOUS

## 2020-12-26 MED ORDER — SODIUM CHLORIDE 0.9 % IR SOLN
Status: DC | PRN
  Administered 2020-12-26: 6000 mL

## 2020-12-26 MED ORDER — DOCUSATE SODIUM 50 MG/5ML PO LIQD: 100.0000 mg | Freq: Two times a day (BID) | ORAL | Status: DC

## 2020-12-26 MED ORDER — METOPROLOL TARTRATE 25 MG/10 ML ORAL SUSPENSION
12.5000 mg | Freq: Two times a day (BID) | ORAL | Status: DC
  Filled 2020-12-26: qty 5

## 2020-12-26 MED FILL — Medication: Qty: 1 | Status: AC

## 2020-12-26 SURGICAL SUPPLY — 69 items
ADH SKN CLS APL DERMABOND .7 (GAUZE/BANDAGES/DRESSINGS) ×1
APL PRP STRL LF DISP 70% ISPRP (MISCELLANEOUS) ×1
BAG DRN RND TRDRP ANRFLXCHMBR (UROLOGICAL SUPPLIES) ×1
BAG LAPAROSCOPIC 12 15 PORT 16 (BASKET) ×1 IMPLANT
BAG RETRIEVAL 12/15 (BASKET) ×2
BAG URINE DRAIN 2000ML AR STRL (UROLOGICAL SUPPLIES) ×1 IMPLANT
CATH 18FR 3 WAY 30 (CATHETERS) ×2
CATH 18FR 3WAY 30CC (CATHETERS) IMPLANT
CHLORAPREP W/TINT 26 (MISCELLANEOUS) ×2 IMPLANT
CLIP VESOLOCK LG 6/CT PURPLE (CLIP) ×3 IMPLANT
CLIP VESOLOCK MED LG 6/CT (CLIP) ×2 IMPLANT
CLIP VESOLOCK XL 6/CT (CLIP) ×2 IMPLANT
COVER SURGICAL LIGHT HANDLE (MISCELLANEOUS) ×2 IMPLANT
COVER TIP SHEARS 8 DVNC (MISCELLANEOUS) ×1 IMPLANT
COVER TIP SHEARS 8MM DA VINCI (MISCELLANEOUS) ×2
COVER WAND RF STERILE (DRAPES) IMPLANT
CUTTER ECHEON FLEX ENDO 45 340 (ENDOMECHANICALS) ×1 IMPLANT
DECANTER SPIKE VIAL GLASS SM (MISCELLANEOUS) ×1 IMPLANT
DERMABOND ADVANCED (GAUZE/BANDAGES/DRESSINGS) ×1
DERMABOND ADVANCED .7 DNX12 (GAUZE/BANDAGES/DRESSINGS) ×2 IMPLANT
DRAIN CHANNEL 15F RND FF 3/16 (WOUND CARE) ×2 IMPLANT
DRAPE ARM DVNC X/XI (DISPOSABLE) ×4 IMPLANT
DRAPE COLUMN DVNC XI (DISPOSABLE) ×1 IMPLANT
DRAPE DA VINCI XI ARM (DISPOSABLE) ×8
DRAPE DA VINCI XI COLUMN (DISPOSABLE) ×2
DRAPE INCISE IOBAN 66X45 STRL (DRAPES) ×2 IMPLANT
DRAPE LAPAROSCOPIC ABDOMINAL (DRAPES) ×1 IMPLANT
DRAPE SHEET LG 3/4 BI-LAMINATE (DRAPES) ×2 IMPLANT
DRSG TEGADERM 4X4.75 (GAUZE/BANDAGES/DRESSINGS) ×2 IMPLANT
ELECT REM PT RETURN 15FT ADLT (MISCELLANEOUS) ×2 IMPLANT
EVACUATOR SILICONE 100CC (DRAIN) ×2 IMPLANT
GLOVE SURG ENC MOIS LTX SZ6.5 (GLOVE) ×2 IMPLANT
GLOVE SURG ENC TEXT LTX SZ7.5 (GLOVE) ×4 IMPLANT
GOWN STRL REUS W/TWL LRG LVL3 (GOWN DISPOSABLE) ×6 IMPLANT
IRRIG SUCT STRYKERFLOW 2 WTIP (MISCELLANEOUS)
IRRIGATION SUCT STRKRFLW 2 WTP (MISCELLANEOUS) IMPLANT
KIT BASIN OR (CUSTOM PROCEDURE TRAY) ×2 IMPLANT
KIT TURNOVER KIT A (KITS) ×2 IMPLANT
NDL INSUFFLATION 14GA 120MM (NEEDLE) ×1 IMPLANT
NEEDLE INSUFFLATION 14GA 120MM (NEEDLE) ×2 IMPLANT
NS IRRIG 1000ML POUR BTL (IV SOLUTION) ×2 IMPLANT
PROTECTOR NERVE ULNAR (MISCELLANEOUS) ×4 IMPLANT
RELOAD STAPLE 45 2.6 WHT THIN (STAPLE) IMPLANT
SCISSORS LAP 5X45 EPIX DISP (ENDOMECHANICALS) ×1 IMPLANT
SEAL CANN UNIV 5-8 DVNC XI (MISCELLANEOUS) ×4 IMPLANT
SEAL XI 5MM-8MM UNIVERSAL (MISCELLANEOUS) ×8
SET IRRIG Y TYPE TUR BLADDER L (SET/KITS/TRAYS/PACK) ×1 IMPLANT
SET TUBE SMOKE EVAC HIGH FLOW (TUBING) ×2 IMPLANT
SOLUTION ELECTROLUBE (MISCELLANEOUS) ×2 IMPLANT
STAPLE RELOAD 45 WHT (STAPLE) ×1 IMPLANT
STAPLE RELOAD 45MM WHITE (STAPLE) ×2
STENT URET 6FRX24 CONTOUR (STENTS) ×1 IMPLANT
SUT ETHILON 3 0 PS 1 (SUTURE) ×2 IMPLANT
SUT MNCRL AB 4-0 PS2 18 (SUTURE) ×4 IMPLANT
SUT PDS AB 1 CTX 36 (SUTURE) ×4 IMPLANT
SUT V-LOC BARB 180 2/0GR6 GS22 (SUTURE)
SUT VIC AB 2-0 CT1 27 (SUTURE) ×2
SUT VIC AB 2-0 CT1 27XBRD (SUTURE) IMPLANT
SUT VICRYL 0 UR6 27IN ABS (SUTURE) ×3 IMPLANT
SUT VLOC BARB 180 ABS3/0GR12 (SUTURE) ×2
SUTURE V-LC BRB 180 2/0GR6GS22 (SUTURE) IMPLANT
SUTURE VLOC BRB 180 ABS3/0GR12 (SUTURE) IMPLANT
TOWEL OR NON WOVEN STRL DISP B (DISPOSABLE) ×2 IMPLANT
TRAY FOLEY MTR SLVR 16FR STAT (SET/KITS/TRAYS/PACK) ×1 IMPLANT
TRAY LAPAROSCOPIC (CUSTOM PROCEDURE TRAY) ×2 IMPLANT
TROCAR BLADELESS OPT 5 100 (ENDOMECHANICALS) ×1 IMPLANT
TROCAR UNIVERSAL OPT 12M 100M (ENDOMECHANICALS) IMPLANT
TROCAR XCEL 12X100 BLDLESS (ENDOMECHANICALS) ×2 IMPLANT
WATER STERILE IRR 1000ML POUR (IV SOLUTION) ×3 IMPLANT

## 2020-12-26 NOTE — H&P (Signed)
Urology Preoperative H&P   Chief Complaint:  Left renal pelvis mass   History of Present Illness: [name] is a 75 y.o. female, referred by Dr. 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 She underwent a diagnostic ureteroscopy on 10/03/2020 with Dr. Gloriann Loan who directly visualized a papillary tumor involving the lower mid pole of the left renal pelvis. Ultimately, biopsy of the lesion came back inconclusive. 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 CTU from March identified a solid and enhancing 1.6 cm mass within the confines of the left renal pelvis. She is here today to discuss left nephroureterectomy.   Currently, she is urinating w/o difficulty and denies flank pain, dysuria or hematuria. She reports a 2 day history of worsening LE swelling associated with pain, itching and skin changes. No prior hx of DVT and she states that she has been ambulatory leading up to surgery.    Past Medical History:  Diagnosis Date   Anxiety    Arthritis    rhematoid,osteoarthritis   Diabetes mellitus without complication ([place])    Dyspnea    increased exertion   Elevated cholesterol    Hemorrhoids    Hypertension    Hypothyroidism    Joint pain    Nocturia    Sleeping difficulties     Past Surgical History:  Procedure Laterality Date   ABDOMINAL HYSTERECTOMY     BACK SURGERY     CERVICAL FUSION  2004   CHOLECYSTECTOMY     EYE SURGERY Left    cataract   PARS PLANA REPAIR OF RETINAL DEATACHMENT     THYROIDECTOMY, PARTIAL      Allergies:  Allergies  Allergen Reactions   Contrast Media [Iodinated Diagnostic Agents] Anaphylaxis and Hives    Only the retina dye;    Abilify [Aripiprazole]     Pt dos not recall reaction   Ultram [Tramadol Hcl] Other (See Comments)    unknown   Zithromax [Azithromycin] Anxiety    Anxiety heightened    Family History   Problem Relation Age of Onset   Diabetes Mother    Breast cancer Mother    Gout Mother    Alzheimer's disease Father    Heart Problems Father    Breast cancer Sister    Alzheimer's disease Sister    Diabetes Sister    Gout Sister    Heart Problems Sister    Alzheimer's disease Brother    Diabetes Brother    Heart Problems Brother     Social History:  reports that she has never smoked. She has never used smokeless tobacco. She reports that she does not drink alcohol and does not use drugs.  ROS: A complete review of systems was performed.  All systems are negative except for pertinent findings as noted.  Physical Exam:  Vital signs in last 24 hours:   Constitutional:  Alert and oriented, No acute distress Cardiovascular: Regular rate and rhythm, No JVD Respiratory: Normal respiratory effort, Lungs clear bilaterally GI: Abdomen is soft, nontender, nondistended, no abdominal masses GU: No CVA tenderness Lymphatic: No lymphadenopathy Neurologic: Grossly intact, no focal deficits Psychiatric: Normal mood and affect Ext:  BL LE non-pitting edema extending up to her calves that is painful with deep palpation.   Laboratory Data:  No results for input(s): WBC, HGB, HCT, PLT in the last 72 hours.  No results for input(s): NA, K,  CL, GLUCOSE, BUN, CALCIUM, CREATININE in the last 72 hours.  Invalid input(s): CO3   No results found for this or any previous visit (from the past 24 hour(s)). Recent Results (from the past 240 hour(s))  SARS CORONAVIRUS 2 (TAT 6-24 HRS) Nasopharyngeal Nasopharyngeal Swab     Status: None   Collection Time: 12/24/20  2:38 PM   Specimen: Nasopharyngeal Swab  Result Value Ref Range Status   SARS Coronavirus 2 NEGATIVE NEGATIVE Final    Comment: (NOTE) SARS-CoV-2 target nucleic acids are NOT DETECTED.  The SARS-CoV-2 RNA is generally detectable in upper and lower respiratory specimens during the acute phase of infection. Negative results do not  preclude SARS-CoV-2 infection, do not rule out co-infections with other pathogens, and should not be used as the sole basis for treatment or other patient management decisions. Negative results must be combined with clinical observations, patient history, and epidemiological information. The expected result is Negative.  Fact Sheet for Patients: SugarRoll.be  Fact Sheet for Healthcare Providers: https://www.woods-mathews.com/  This test is not yet approved or cleared by the [place] FDA and  has been authorized for detection and/or diagnosis of SARS-CoV-2 by FDA under an Emergency Use Authorization (EUA). This EUA will remain  in effect (meaning this test can be used) for the duration of the COVID-19 declaration under Se ction 564(b)(1) of the Act, 21 U.S.C. section 360bbb-3(b)(1), unless the authorization is terminated or revoked sooner.  Performed at [facility], [place] [address], [place], [place] [address]     Renal Function: Recent Labs    12/19/20 1353  CREATININE 0.88   Estimated Creatinine Clearance: 54.9 mL/min (by C-G formula based on SCr of 0.88 mg/dL).  Radiologic Imaging: CLINICAL DATA: Gross hematuria.   EXAM:  CT ABDOMEN AND PELVIS WITHOUT AND WITH CONTRAST   TECHNIQUE:  Multidetector CT imaging of the abdomen and pelvis was performed  following the standard protocol before and following the bolus  administration of intravenous contrast.   CONTRAST: 125 cc of Omnipaque 300   COMPARISON: 02/28/16.   FINDINGS:  Lower chest: Postinflammatory scarring identified within both lower  lobes and lingula. No acute findings.   Hepatobiliary: No focal liver abnormality is seen. No gallstones,  gallbladder wall thickening, or biliary dilatation.   Pancreas: Unremarkable. No pancreatic ductal dilatation or  surrounding inflammatory changes.   Spleen: Normal in size without focal abnormality.    Adrenals/Urinary Tract:   No kidney stones identified bilaterally. No ureteral or bladder  calculi.   Within the inferior pole collecting system of the left kidney there  is a solid enhancing lesion which has a maximum dimension of 1.6 cm,  image 98/604. This is worrisome for urothelial neoplasm. No  additional suspicious filling defects identified within the  collecting systems, ureters or urinary bladder.   Stomach/Bowel: Stomach is within normal limits. Appendix appears  normal. No evidence of bowel wall thickening, distention, or  inflammatory changes.   Vascular/Lymphatic: Aortic atherosclerosis. No aneurysm. No  abdominopelvic adenopathy.   Reproductive: Status post hysterectomy. No adnexal masses.   Other: No free fluid or fluid collections.   Musculoskeletal: Lumbar spondylosis. Status post PLIF at L2 through  L5. Marked degenerative disc disease noted at L1-2 and L5-S1.   IMPRESSION:  1. There is a solid enhancing lesion within the inferior pole  collecting system of the left kidney which has a maximum dimension  of 1.6 cm. This is worrisome for urothelial neoplasm.  2. No findings of nodal metastasis or  solid organ metastasis within  the abdomen or pelvis.   Aortic Atherosclerosis (ICD10-I70.0).    Electronically Signed  By: Kerby Moors M.D.  On: 09/11/2020 10:06    I independently reviewed the above imaging studies.  Assessment and Plan WINTA BARCELO is a 75 y.o. 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    -LE dopplers pending to rule out DVT.  If negative, will proceed with surgery.   -Pathology, urine cytology and imaging results discussed with the patient, which are consistent with an upper tract urothelial cell carcinoma. The risks, benefits and alternatives of cystoscopy with LEFT ureteroscopy, TUR of the LEFT ureteral orifice and robot-assisted  laparoscopic LEFT nephroureterectomy were discussed in detail including but not limited to: negative pathology, open conversion, infection of the skin/abdominal cavity, VTE, MI/CVA, lymphatic leak, injury to adjacent solid/hollow viscus organs, bleeding requiring a blood transfusion, catastrophic bleeding, bladder perforation hernia formation and other imponderables. The patient voices understanding and wishes to proceed.    Ellison Hughs, MD 12/26/2020, 10:03 AM  [facility] Pager: [phone]

## 2020-12-26 NOTE — Op Note (Signed)
Operative Note  Preoperative diagnosis:  1.  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  Urothelial cell carcinoma the left renal pelvis  Procedure(s): 1.  Cystoscopy with left ureteral stent placement and TUR of the left ureteral orifice 2.  Robot-assisted laparoscopic left nephro ureterectomy  Surgeon: Ellison Hughs, MD  Assistants: Debbrah Alar, PA-C  An assistant was required for this surgical procedure.  The duties of the assistant included but were not limited to suctioning, passing suture, camera manipulation, retraction.  This procedure would not be able to be performed without an Environmental consultant.   Kennyth Arnold, MD PGY 4  Anesthesia:  General  Complications:  Grade 1 splenic laceration  EBL: 180 mL  Specimens: 1.  Left kidney and ureter 2.  Splenule  Drains/Catheters: 1.  Left lower quadrant JP drain 2.  18 French three-way Foley catheter with 10 mL of sterile water in the balloon  Intraoperative findings:   Watertight bladder cuff closure  Indication:  [name] is a 75 y.o. female with a 1.6 cm left renal pelvis mass with visual features consistent with urothelial cell carcinoma as well as a positive urine cytology consistent with high-grade urothelial cell carcinoma.  The patient has been consented for the above procedures, voices understanding wishes to proceed.  Description of procedure:  After informed consent was obtained, the patient was brought to the operating room and general endotracheal anesthesia was administered.  The patient was placed in the dorsolithotomy position and prepped and draped in usual sterile fashion.  Timeouts performed.  A 23 French rigid cystoscope was then inserted into the bladder.  A complete bladder survey revealed no intravesical pathology.  A Glidewire was then advanced up the left ureter and a 6 [place], 24 cm JJ stent was placed.  The rigid cystoscope was then exchanged for a 26  French resectoscope with a Psychologist, forensic working Actuary.  The left ureteral orifice was then circumferentially excised down to perinephric fat.  The patient was then placed in the right lateral decubitus position and prepped and draped in usual sterile fashion.  A timeout was performed.  An 8 mm incision was then made lateral to the left rectus muscle at the level of the left 12th rib.  Abdominal access was obtained via a Veress needle.  The abdominal cavity was then insufflated up to 15 mmHg.  An 8 mm port was then introduced into the abdominal cavity.  Inspection of the port entry site by the robotic camera revealed no adjacent organ injury.  We then placed 3 additional 8 mm robotic ports to triangulate the left renal hilum.  A 12 mm assistant port was then placed between the carmera port and 3rd robotic arm.  The white line of [place]oldt along the descending colon was incised sharply and the colon, along with its mesocolonic fat, was reflected medially until the aorta was identified.  We then made a small window adjacent to the lower pole of the left kidney, identifying the left psoas muscle, left ureter and left gonadal vein.  The left ureter and gonadal vein were then reflected anteriorly allowing [place] to then incised the perihilar attachments using electrocautery.  We encountered a small lumbar vein adjacent to the insertion of the left gonadal vein into the left renal vein.  This lumbar vein was ligated with hemo-lock clips in 2 places and incised sharply.  This provided [place] excellent exposure to the left renal hilum.    She had 2 left  renal arteries that were isolated and then ligated with Hem-o-lok clips and sharply incised.  A 45 mm endovascular stapler was then used to ligate left renal vein, achieving excellent hemostasis.  The remaining peri-renal attachments were then excised using a combination of blunt dissection and electrocautery.  The left adrenal gland was spared.  While mobilizing the kidney a  grade 1 splenic laceration was made.  Electrocautery was used as well as surgicel, which provided good hemostasis.   The left ureter was then traced down to the bladder hiatus and using a commendation of blunt dissection and electrocautery, the left ureter was removed en bloc.  The bladder cuff was then closed using a 3 OV lock suture.  Approximately 300 mL of saline was instilled into the bladder, revealing a watertight bladder closure.  The robot was then de-docked.  A left lower quadrant Gibson incision was then made and the left kidney and ureter were removed.  A 10 [place] JP drain was then placed in the pelvis through her most lateral port site.  The fascia within the midline assistant port was then closed using an interrupted 0 Vicryl suture.  The fascia of the internal and external oblique was then closed using a 0 PDS suture in a running fashion.  The subcutaneous tissue within the [facility] incision was then closed using a running 0 Vicryl suture.  All skin incisions were then closed using 4-0 Monocryl and then dressed with Dermabond.  The patient tolerated the procedure well and was transferred to the postanesthesia in stable condition.    Plan:  Monitor on the floor overnight.

## 2020-12-26 NOTE — Progress Notes (Signed)
Lower extremity venous bilateral study completed.  Preliminary results relayed to RN in short stay upon completion of examination.   See CV Proc for preliminary results report.   Darlin Coco, RDMS, RVT

## 2020-12-26 NOTE — Consult Note (Signed)
NAME:  [name], MRN:  [id number], DOB:  1946-03-15, LOS: 0 ADMISSION DATE:  12/26/2020, CONSULTATION DATE:  12/26/2020 REFERRING MD:  Dr. Lovena Neighbours, CHIEF COMPLAINT:  Left renal pelvic mass s/p left ureteral stent placement and TUR of left ureteral orifice   History of Present Illness:  75 year old female presents to hospital 6/15 for scheduled left ureteral stent placement and TUR with known large left tumor within the collecting system. 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 Underwent diagnostic ureteroscopy 3/23 with biopsy pathology inconclusive.   Intraoperative state complicated by grade 1 splenic laceration. Patient extubated. However, in PACU developed respiratory distress with oxygen saturation 53% requiring re-intubation. Critical Care Consulted.   Pertinent  Medical History  Left renal pelvic mass suspicious for high grade urothelial carcinoma, DM, HTN, Hypothyroidism, Rheumatoid Arthritis, Obstructive sleep apnea, Periodic Limb Movement Disorder  Significant Hospital Events: Including procedures, antibiotic start and stop dates in addition to other pertinent events   6/15 presents to OR. Re-intubated for respiratory distress  Interim History / Subjective:   Patient's sister was at the bedside briefly and confirmed her full code status.   Cased discussed with Dr. Nyoka Cowden of anesthesia and she was given naloxone without improvement of her mental status or respiratory failure. There were no issues ventilating/oxygenating her during the surgery.   Unable to obtain further history as patient is intubated and sedated.  Objective   Blood pressure (!) 160/66, pulse 76, temperature (!) 97 F (36.1 C), temperature source Axillary, resp. rate 12, height 5\' 7"  (1.702 m), SpO2 100 %.    Vent Mode: PRVC FiO2 (%):  [100 %] 100 % Set Rate:  [16 bmp] 16 bmp Vt Set:  [490 mL] 490 mL PEEP:  [5 cmH20] 5 cmH20 Plateau Pressure:  [19 cmH20] 19 cmH20    Intake/Output Summary (Last 24 hours) at 12/26/2020 2041 Last data filed at 12/26/2020 2000 Gross per 24 hour  Intake 1700 ml  Output 630 ml  Net 1070 ml    Examination: General: Elderly woman, pale, no acute distress HENT: [place]/AT, moist mucous membranes, sclera anicteric, PERRL, Left eye lens replacement Lungs: Basilar crackles, no wheezing or rhonchi. Cardiovascular: Regular rate and rhythm, no murmurs, no JVD Abdomen: Soft, surgical incisions are clean dry and intact, left-sided JP drain in place with serosanguineous drainage Extremities: 1+ edema bilaterally, warm Neuro: Sedated, moving all extremities spontaneously GU: Foley catheter in place Skin: rash on bilateral distal lower extremities   Bedside echo performed, no signs of reduced RV or LV function. No pericardial effusion.   Labs/imaging that I have personally reviewed  (right click and "Reselect all SmartList Selections" daily)  CXR 6/15: bilateral interstitial prominence, no pneumothorax  CBC: WBC 11.7, hgb 11.7, plts 159 Glucose 200 CMP: Na 139, K 4.6, CO2 25, Cr 1.26 (baseline 0.88), Ca 8.1, Albumin 3.4, AST/ALT 6/28  Resolved Hospital Problem list     Assessment & Plan:  Acute Hypoxemic Respiratory Failure Hx of Sleep Apnea In post-op setting with history of obstructive sleep apnea, now with bilateral interstitial infiltrates. Differential includes pulmonary edema vs pneumonia (aspiration vs community acquired) vs component of hypercapnic resp failure in post op setting.  - Cont PRVC mechanical ventilation - PAD pathway ordered: propofol and fentanyl for sedation  - VAP protocol ordered - Levaqin + ceftriaxone for CAP/aspiration coverage for now, low threshold to discontinue if concern for infection dissipates - check BNP - given lasix in PACU - SBT/SAT in the AM  Acute Kidney  Injury In post-op setting of left nephro-uretectomy vs prerenal etiology - monitor renal function - receiving albumin now,  received lasix   Urothelial cell carcinoma s/p left nephro-ureterectomy - per Urology - PRN oxycodone per tube and PRN fentanyl IV ordered  Diabetes Mellitus Type II - SSI  Hypertension - hold metoprolol for now  Rheumatoid Arthritis - continue 5mg  prednisone, hydroxychloroquine and leflunamide  Hyperlipidemia - continue atorvastatin  Depression - continue home olanzipine and escitalopram  Best practice (right click and "Reselect all SmartList Selections" daily)  Diet:  NPO Pain/Anxiety/Delirium protocol (if indicated): Yes (RASS goal 0) VAP protocol (if indicated): Yes DVT prophylaxis: SCD GI prophylaxis: PPI Glucose control:  SSI Yes Central venous access:  N/A Arterial line:  N/A Foley:  Yes, and it is still needed Mobility:  bed rest  PT consulted: N/A Last date of multidisciplinary goals of care discussion [full code per her sister, 12/26/20] Code Status:  full code Disposition: ICU  Labs   CBC: Recent Labs  Lab 12/26/20 1819 12/26/20 1839  WBC  --  11.7*  NEUTROABS  --  10.1*  HGB 12.2 11.7*  HCT 40.2 38.0  MCV  --  100.3*  PLT  --  149    Basic Metabolic Panel: Recent Labs  Lab 12/26/20 1839  NA 139  K 4.6  CL 105  CO2 25  GLUCOSE 200*  BUN 10  CREATININE 1.26*  CALCIUM 8.1*   GFR: Estimated Creatinine Clearance: 41.7 mL/min (A) (by C-G formula based on SCr of 1.26 mg/dL (H)). Recent Labs  Lab 12/26/20 1839  WBC 11.7*    Liver Function Tests: Recent Labs  Lab 12/26/20 1839  AST 36  ALT 28  ALKPHOS 96  BILITOT 0.6  PROT 6.7  ALBUMIN 3.4*   No results for input(s): LIPASE, AMYLASE in the last 168 hours. No results for input(s): AMMONIA in the last 168 hours.  ABG    Component Value Date/Time   PHART 7.420 (H) 05/12/2008 0927   PCO2ART 31.3 (L) 05/12/2008 0927   PO2ART 65.0 (L) 05/12/2008 0927   HCO3 26.3 05/24/2018 1131   TCO2 28 05/24/2018 1134   ACIDBASEDEF 4.0 (H) 05/12/2008 0929   O2SAT 59.0 05/24/2018 1131      Coagulation Profile: No results for input(s): INR, PROTIME in the last 168 hours.  Cardiac Enzymes: No results for input(s): CKTOTAL, CKMB, CKMBINDEX, TROPONINI in the last 168 hours.  HbA1C: Hgb A1c MFr Bld  Date/Time Value Ref Range Status  12/19/2020 01:53 PM 6.0 (H) 4.8 - 5.6 % Final    Comment:    (NOTE)         Prediabetes: 5.7 - 6.4         Diabetes: >6.4         Glycemic control for adults with diabetes: <7.0   08/11/2018 06:43 AM 5.7 (H) 4.8 - 5.6 % Final    Comment:    (NOTE) Pre diabetes:          5.7%-6.4% Diabetes:              >6.4% Glycemic control for   <7.0% adults with diabetes     CBG: Recent Labs  Lab 12/26/20 1042 12/26/20 1718 12/26/20 1901 12/26/20 2001  GLUCAP 100* 156* 153* 164*    Review of Systems:   Unable to perform review of systems as patient is intubated and sedated  Past Medical History:  She,  has a past medical history of Anxiety, Arthritis, Diabetes mellitus without complication (  [place]), Dyspnea, Elevated cholesterol, Hemorrhoids, Hypertension, Hypothyroidism, Joint pain, Nocturia, and Sleeping difficulties.   Surgical History:   Past Surgical History:  Procedure Laterality Date   ABDOMINAL HYSTERECTOMY     BACK SURGERY     CERVICAL FUSION  2004   CHOLECYSTECTOMY     EYE SURGERY Left    cataract   PARS PLANA REPAIR OF RETINAL DEATACHMENT     THYROIDECTOMY, PARTIAL       Social History:   reports that she has never smoked. She has never used smokeless tobacco. She reports that she does not drink alcohol and does not use drugs.   Family History:  Her family history includes Alzheimer's disease in her brother, father, and sister; Breast cancer in her mother and sister; Diabetes in her brother, mother, and sister; Gout in her mother and sister; Heart Problems in her brother, father, and sister.   Allergies Allergies  Allergen Reactions   Contrast Media [Iodinated Diagnostic Agents] Anaphylaxis and Hives    Only the  retina dye;    Abilify [Aripiprazole]     Pt dos not recall reaction   Ultram [Tramadol Hcl] Other (See Comments)    unknown   Zithromax [Azithromycin] Anxiety    Anxiety heightened     Home Medications  Prior to Admission medications   Medication Sig Start Date End Date Taking? Authorizing Provider  ACCU-CHEK GUIDE test strip CHECK BLOOD SUGAR ONCE A DAY 10/29/17  Yes [provider]  aspirin 81 MG chewable tablet Chew 81 mg by mouth daily.   Yes [provider]  benztropine (COGENTIN) 0.5 MG tablet Take 0.5 mg by mouth 2 (two) times daily.   Yes [provider]  docusate sodium (COLACE) 100 MG capsule Take 1 capsule (100 mg total) by mouth 2 (two) times daily. 12/26/20  Yes Dancy, Amanda, PA-C  escitalopram (LEXAPRO) 10 MG tablet Take 10 mg by mouth daily.   Yes [provider]  HYDROcodone-acetaminophen (NORCO) 5-325 MG tablet Take 1-2 tablets by mouth every 6 (six) hours as needed for moderate pain. 12/26/20  Yes Dancy, Estill Bamberg, PA-C  hydroxychloroquine (PLAQUENIL) 200 MG tablet Take 400 mg by mouth daily.   Yes [provider]  leflunomide (ARAVA) 20 MG tablet Take 20 mg by mouth daily.   Yes [provider]  metoprolol succinate (TOPROL-XL) 25 MG 24 hr tablet Take 25 mg by mouth daily.   Yes [provider]  OLANZapine (ZYPREXA) 2.5 MG tablet Take 2.5 mg by mouth daily.   Yes [provider]  predniSONE (DELTASONE) 5 MG tablet Take 5 mg by mouth daily with breakfast.   Yes [provider]  promethazine (PHENERGAN) 12.5 MG tablet Take 1 tablet (12.5 mg total) by mouth every 4 (four) hours as needed for nausea or vomiting. 12/26/20  Yes Dancy, Estill Bamberg, PA-C  aspirin EC 81 MG tablet Take 1 tablet (81 mg total) by mouth daily. Patient not taking: No sig reported 08/16/18   Connye Burkitt, NP  atorvastatin (LIPITOR) 40 MG tablet Take 1 tablet (40 mg total) by mouth daily. Patient not taking: No sig reported 08/16/18    Connye Burkitt, NP  escitalopram (LEXAPRO) 20 MG tablet Take 1 tablet (20 mg total) by mouth daily. For mood Patient not taking: No sig reported 03/15/19   Mozingo, Berdie Ogren, NP  fluticasone (FLONASE) 50 MCG/ACT nasal spray Place 1 spray into both nostrils daily as needed for allergies or rhinitis.  Patient not taking: No sig reported  [provider]  folic acid (FOLVITE) 1 MG tablet Take 1 mg by mouth daily.  Patient not taking: No sig reported    [provider]  risperiDONE (RISPERDAL M-TAB) 0.5 MG disintegrating tablet Take 1 tablet (0.5 mg total) by mouth at bedtime. Patient not taking: No sig reported 03/15/19   Mozingo, Berdie Ogren, NP     Critical care time: 30 minutes    Freda Jackson, MD [facility] Office: [phone]   See Amion for personal pager PCCM on call pager [phone] until 7pm. Please call Elink 7p-7a. [phone]    ,

## 2020-12-26 NOTE — Anesthesia Preprocedure Evaluation (Addendum)
Anesthesia Evaluation  Patient identified by MRN, date of birth, ID band Patient awake    Reviewed: Allergy & Precautions, NPO status , Patient's Chart, lab work & pertinent test results, reviewed documented beta blocker date and time   Airway Mallampati: I       Dental  (+) Poor Dentition, Missing,    Pulmonary    Pulmonary exam normal breath sounds clear to auscultation       Cardiovascular hypertension, Pt. on home beta blockers  Rhythm:Regular Rate:Normal + Systolic murmurs    Neuro/Psych PSYCHIATRIC DISORDERS Anxiety Depression negative neurological ROS     GI/Hepatic negative GI ROS,   Endo/Other  diabetes, Well Controlled, Type 2Hypothyroidism   Renal/GU negative Renal ROS  negative genitourinary   Musculoskeletal  (+) Arthritis , Rheumatoid disorders,    Abdominal (+) + obese,   Peds  Hematology   Anesthesia Other Findings   Reproductive/Obstetrics                           Anesthesia Physical Anesthesia Plan  ASA: 2  Anesthesia Plan: General   Post-op Pain Management:    Induction: Intravenous  PONV Risk Score and Plan: 4 or greater and Ondansetron, Dexamethasone and Treatment may vary due to age or medical condition  Airway Management Planned: Oral ETT  Additional Equipment: None  Intra-op Plan:   Post-operative Plan: Extubation in OR  Informed Consent: I have reviewed the patients History and Physical, chart, labs and discussed the procedure including the risks, benefits and alternatives for the proposed anesthesia with the patient or authorized representative who has indicated his/her understanding and acceptance.     Dental advisory given  Plan Discussed with: CRNA  Anesthesia Plan Comments:         Anesthesia Quick Evaluation

## 2020-12-26 NOTE — Discharge Instructions (Signed)

## 2020-12-26 NOTE — Anesthesia Procedure Notes (Signed)
Procedure Name: Intubation Date/Time: 12/26/2020 1:45 PM Performed by: Gwyndolyn Saxon, CRNA Pre-anesthesia Checklist: Patient identified, Emergency Drugs available, Suction available and Patient being monitored Patient Re-evaluated:Patient Re-evaluated prior to induction Oxygen Delivery Method: Circle system utilized Preoxygenation: Pre-oxygenation with 100% oxygen Induction Type: IV induction Ventilation: Mask ventilation without difficulty Laryngoscope Size: Miller and 2 Grade View: Grade I Tube type: Oral Tube size: 7.0 mm Number of attempts: 1 Airway Equipment and Method: Patient positioned with wedge pillow and Stylet Placement Confirmation: ETT inserted through vocal cords under direct vision, positive ETCO2 and breath sounds checked- equal and bilateral Secured at: 21 cm Tube secured with: Tape Dental Injury: Teeth and Oropharynx as per pre-operative assessment

## 2020-12-26 NOTE — Transfer of Care (Signed)
Immediate Anesthesia Transfer of Care Note  Patient: [name]  Procedure(s) Performed: XI ROBOT ASSITED LAPAROSCOPIC  LEFT NEPHROURETERECTOMY/ STENT PLACEMENT/ TRANSURETHRAL RESECTION OF LEFT URETERAL ORIFICE;EXCISION OF SPLENULE (Left)  Patient Location: PACU  Anesthesia Type:General  Level of Consciousness: drowsy  Airway & Oxygen Therapy: Patient Spontanous Breathing and Patient connected to face mask oxygen  Post-op Assessment: Report given to RN and Post -op Vital signs reviewed and stable  Post vital signs: Reviewed and stable  Last Vitals:  Vitals Value Taken Time  BP 141/86 12/26/20 1715  Temp    Pulse 69 12/26/20 1717  Resp    SpO2 96 % 12/26/20 1717  Vitals shown include unvalidated device data.  Last Pain:  Vitals:   12/26/20 1039  TempSrc: Oral         Complications: No notable events documented.

## 2020-12-26 NOTE — Progress Notes (Signed)
Patient transferred in her bed to ICU report given to [facility]. Patient developed respiratory distress with sudden onset expiratory wheezing and Rhonchi which did not respond to breathing tx,she was seen at bedside by Dr.Green who had patient intubated and placed on the vent. She was treated with IVP lasix,albumin,and placed on propofol drip. She awakened on arrival to the ICU and was responding appropriately as she did when she first arrived to the PACU. She conts with 4+ edema to bilateral lower extremities. Results pending for PCXR. Her EKG was unremarkable. She has ABG ordered to be completed in the ICU via RT on arrival to unit. CBG was 153. No further changes noted.

## 2020-12-26 NOTE — Anesthesia Procedure Notes (Signed)
Procedure Name: Intubation Date/Time: 12/26/2020 6:20 PM Performed by: Lissa Morales, CRNA Pre-anesthesia Checklist: Emergency Drugs available, Suction available and Patient being monitored Patient Re-evaluated:Patient Re-evaluated prior to induction Preoxygenation: Pre-oxygenation with 100% oxygen Induction Type: IV induction Ventilation: Mask ventilation without difficulty Laryngoscope Size: Glidescope and 3 Grade View: Grade III Tube type: Oral Tube size: 7.0 mm Number of attempts: 2 Airway Equipment and Method: Stylet and Oral airway Placement Confirmation: ETT inserted through vocal cords under direct vision, positive ETCO2 and breath sounds checked- equal and bilateral Secured at: 23 cm Tube secured with: Tape Dental Injury: Teeth and Oropharynx as per pre-operative assessment  Difficulty Due To: Difficulty was anticipated Comments: Called to PACU  for patient in respiratory distress and O2 sats 53%.Dr Nyoka Cowden present . Proceeded to intubate after  Amboo ventilation and O2 sats up to 94% prior to intubaton.  First  look with MAC 4 unable to see cords . Oralpharynx appears swollen , small mouth . Glidescope  blade3 used with good visualization of cords.  ETCO2 present ,bilateral breath sounds with rhonchi and wheezing/ O2 sats 100%

## 2020-12-26 NOTE — Anesthesia Postprocedure Evaluation (Signed)
Anesthesia Post Note  Patient: [name]  Procedure(s) Performed: XI ROBOT ASSITED LAPAROSCOPIC  LEFT NEPHROURETERECTOMY/ STENT PLACEMENT/ TRANSURETHRAL RESECTION OF LEFT URETERAL ORIFICE;EXCISION OF SPLENULE (Left)     Patient location during evaluation: PACU Anesthesia Type: General Level of consciousness: awake Pain management: pain level controlled Vital Signs Assessment: post-procedure vital signs reviewed and stable Respiratory status: spontaneous breathing Cardiovascular status: stable Postop Assessment: no apparent nausea or vomiting Anesthetic complications: no   No notable events documented.  Last Vitals:  Vitals:   12/26/20 1730 12/26/20 1745  BP: (!) 129/59 130/61  Pulse: 70 65  Resp:    Temp:    SpO2: 92% 98%    Last Pain:  Vitals:   12/26/20 1745  TempSrc:   PainSc: Asleep                 Kennith Morss

## 2020-12-27 ENCOUNTER — Other Ambulatory Visit: Payer: Self-pay

## 2020-12-27 ENCOUNTER — Inpatient Hospital Stay (HOSPITAL_COMMUNITY): Payer: Medicare Other

## 2020-12-27 ENCOUNTER — Encounter (HOSPITAL_COMMUNITY): Payer: Self-pay | Admitting: Urology

## 2020-12-27 DIAGNOSIS — J9601 Acute respiratory failure with hypoxia: Secondary | ICD-10-CM | POA: Diagnosis not present

## 2020-12-27 LAB — GLUCOSE, CAPILLARY
Glucose-Capillary: 126 mg/dL — ABNORMAL HIGH (ref 70–99)
Glucose-Capillary: 131 mg/dL — ABNORMAL HIGH (ref 70–99)
Glucose-Capillary: 137 mg/dL — ABNORMAL HIGH (ref 70–99)
Glucose-Capillary: 148 mg/dL — ABNORMAL HIGH (ref 70–99)
Glucose-Capillary: 153 mg/dL — ABNORMAL HIGH (ref 70–99)
Glucose-Capillary: 164 mg/dL — ABNORMAL HIGH (ref 70–99)

## 2020-12-27 LAB — CBC
HCT: 28.7 % — ABNORMAL LOW (ref 36.0–46.0)
Hemoglobin: 9.1 g/dL — ABNORMAL LOW (ref 12.0–15.0)
MCH: 30.7 pg (ref 26.0–34.0)
MCHC: 31.7 g/dL (ref 30.0–36.0)
MCV: 97 fL (ref 80.0–100.0)
Platelets: 105 10*3/uL — ABNORMAL LOW (ref 150–400)
RBC: 2.96 MIL/uL — ABNORMAL LOW (ref 3.87–5.11)
RDW: 15.5 % (ref 11.5–15.5)
WBC: 9.6 10*3/uL (ref 4.0–10.5)
nRBC: 0 % (ref 0.0–0.2)

## 2020-12-27 LAB — TSH: TSH: 0.148 u[IU]/mL — ABNORMAL LOW (ref 0.350–4.500)

## 2020-12-27 LAB — TRIGLYCERIDES: Triglycerides: 63 mg/dL (ref <150)

## 2020-12-27 LAB — BASIC METABOLIC PANEL
Anion gap: 8 (ref 5–15)
BUN: 11 mg/dL (ref 8–23)
CO2: 24 mmol/L (ref 22–32)
Calcium: 7.1 mg/dL — ABNORMAL LOW (ref 8.9–10.3)
Chloride: 102 mmol/L (ref 98–111)
Creatinine, Ser: 1.23 mg/dL — ABNORMAL HIGH (ref 0.44–1.00)
GFR, Estimated: 46 mL/min — ABNORMAL LOW (ref >60)
Glucose, Bld: 273 mg/dL — ABNORMAL HIGH (ref 70–99)
Potassium: 3.3 mmol/L — ABNORMAL LOW (ref 3.5–5.1)
Sodium: 134 mmol/L — ABNORMAL LOW (ref 135–145)

## 2020-12-27 LAB — BLOOD GAS, ARTERIAL
Acid-base deficit: 1.3 mmol/L (ref 0.0–2.0)
Bicarbonate: 22.7 mmol/L (ref 20.0–28.0)
FIO2: 28
O2 Saturation: 97.1 %
Patient temperature: 98.6
pCO2 arterial: 37.6 mmHg (ref 32.0–48.0)
pH, Arterial: 7.398 (ref 7.350–7.450)
pO2, Arterial: 109 mmHg — ABNORMAL HIGH (ref 83.0–108.0)

## 2020-12-27 LAB — MAGNESIUM: Magnesium: 1.7 mg/dL (ref 1.7–2.4)

## 2020-12-27 LAB — PHOSPHORUS: Phosphorus: 1.8 mg/dL — ABNORMAL LOW (ref 2.5–4.6)

## 2020-12-27 LAB — PROCALCITONIN: Procalcitonin: 0.32 ng/mL

## 2020-12-27 MED ORDER — ROPINIROLE HCL 0.25 MG PO TABS
0.2500 mg | ORAL_TABLET | Freq: Every day | ORAL | Status: DC
  Administered 2020-12-27 – 2020-12-31 (×5): 0.25 mg via ORAL
  Filled 2020-12-27 (×5): qty 1

## 2020-12-27 MED ORDER — DOCUSATE SODIUM 100 MG PO CAPS
100.0000 mg | ORAL_CAPSULE | Freq: Two times a day (BID) | ORAL | Status: DC
  Administered 2020-12-27 – 2021-01-01 (×5): 100 mg via ORAL
  Filled 2020-12-27 (×10): qty 1

## 2020-12-27 MED ORDER — INSULIN ASPART 100 UNIT/ML IJ SOLN
0.0000 [IU] | INTRAMUSCULAR | Status: DC
  Administered 2020-12-27 (×2): 1 [IU] via SUBCUTANEOUS
  Administered 2020-12-27: 2 [IU] via SUBCUTANEOUS
  Administered 2020-12-27: 1 [IU] via SUBCUTANEOUS
  Administered 2020-12-27 (×2): 2 [IU] via SUBCUTANEOUS
  Administered 2020-12-27: 1 [IU] via SUBCUTANEOUS

## 2020-12-27 MED ORDER — BENZTROPINE MESYLATE 0.5 MG PO TABS
0.5000 mg | ORAL_TABLET | Freq: Two times a day (BID) | ORAL | Status: DC
  Administered 2020-12-27 – 2021-01-01 (×10): 0.5 mg via ORAL
  Filled 2020-12-27 (×10): qty 1

## 2020-12-27 MED ORDER — POTASSIUM PHOSPHATES 15 MMOLE/5ML IV SOLN
24.0000 mmol | Freq: Once | INTRAVENOUS | Status: AC
  Administered 2020-12-27: 24 mmol via INTRAVENOUS
  Filled 2020-12-27: qty 8

## 2020-12-27 MED ORDER — DIPHENHYDRAMINE HCL 12.5 MG/5ML PO ELIX: 12.5000 mg | ORAL_SOLUTION | Freq: Four times a day (QID) | ORAL | Status: DC | PRN

## 2020-12-27 MED ORDER — FUROSEMIDE 10 MG/ML IJ SOLN
20.0000 mg | Freq: Once | INTRAMUSCULAR | Status: AC
  Administered 2020-12-27: 20 mg via INTRAVENOUS
  Filled 2020-12-27: qty 2

## 2020-12-27 MED ORDER — SODIUM CHLORIDE 0.9 % IV SOLN
2.0000 g | Freq: Two times a day (BID) | INTRAVENOUS | Status: DC
  Administered 2020-12-27 – 2020-12-31 (×9): 2 g via INTRAVENOUS
  Filled 2020-12-27 (×10): qty 2

## 2020-12-27 MED ORDER — MAGNESIUM SULFATE 2 GM/50ML IV SOLN
2.0000 g | Freq: Once | INTRAVENOUS | Status: AC
  Administered 2020-12-27: 2 g via INTRAVENOUS
  Filled 2020-12-27: qty 50

## 2020-12-27 MED ORDER — DIPHENHYDRAMINE HCL 50 MG/ML IJ SOLN: 12.5000 mg | Freq: Four times a day (QID) | INTRAMUSCULAR | Status: DC | PRN

## 2020-12-27 MED ORDER — POTASSIUM CHLORIDE 10 MEQ/100ML IV SOLN
10.0000 meq | INTRAVENOUS | Status: DC
  Administered 2020-12-27 (×3): 10 meq via INTRAVENOUS
  Filled 2020-12-27 (×4): qty 100

## 2020-12-27 MED ORDER — OXYCODONE-ACETAMINOPHEN 5-325 MG PO TABS
1.0000 | ORAL_TABLET | ORAL | Status: DC | PRN
  Administered 2020-12-27: 1 via ORAL
  Filled 2020-12-27: qty 1

## 2020-12-27 MED ORDER — ESCITALOPRAM OXALATE 10 MG PO TABS
10.0000 mg | ORAL_TABLET | Freq: Every day | ORAL | Status: DC
  Administered 2020-12-28 – 2021-01-01 (×5): 10 mg via ORAL
  Filled 2020-12-27 (×6): qty 1

## 2020-12-27 MED ORDER — METOPROLOL TARTRATE 25 MG PO TABS
12.5000 mg | ORAL_TABLET | Freq: Two times a day (BID) | ORAL | Status: DC
  Administered 2020-12-27 – 2021-01-01 (×10): 12.5 mg via ORAL
  Filled 2020-12-27 (×10): qty 1

## 2020-12-27 MED ORDER — ACETAMINOPHEN 325 MG PO TABS: 650.0000 mg | ORAL_TABLET | Freq: Four times a day (QID) | ORAL | Status: DC | PRN

## 2020-12-27 MED ORDER — HYDROXYCHLOROQUINE SULFATE 200 MG PO TABS
400.0000 mg | ORAL_TABLET | Freq: Every day | ORAL | Status: DC
  Administered 2020-12-28 – 2021-01-01 (×5): 400 mg via ORAL
  Filled 2020-12-27 (×5): qty 2

## 2020-12-27 MED ORDER — OLANZAPINE 5 MG PO TABS
2.5000 mg | ORAL_TABLET | Freq: Every day | ORAL | Status: DC
  Administered 2020-12-28 – 2021-01-01 (×5): 2.5 mg via ORAL
  Filled 2020-12-27 (×6): qty 1

## 2020-12-27 MED ORDER — POLYETHYLENE GLYCOL 3350 17 G PO PACK
17.0000 g | PACK | Freq: Every day | ORAL | Status: DC
  Administered 2020-12-28: 17 g via ORAL
  Filled 2020-12-27: qty 1

## 2020-12-27 NOTE — Progress Notes (Signed)
CPAP set up, attempted to placed patient and she became anxious and stated, "Take it Off". Placed patient on 2L [place]. Spo2 95%.

## 2020-12-27 NOTE — Consult Note (Addendum)
NAME:  [name], MRN:  [id number], DOB:  1946-02-17, LOS: 1 ADMISSION DATE:  12/26/2020, CONSULTATION DATE:  12/26/2020 REFERRING MD:  Dr. Lovena Neighbours, CHIEF COMPLAINT:  Left renal pelvic mass s/p left ureteral stent placement and TUR of left ureteral orifice   BRIEF  75 year old female presents to hospital 6/15 for scheduled left ureteral stent placement and TUR with known large left tumor within the collecting system. 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 Underwent diagnostic ureteroscopy 3/23 with biopsy pathology inconclusive. Intraoperative state complicated by grade 1 splenic laceration. Patient extubated. However, in PACU developed respiratory distress with oxygen saturation 53% requiring re-intubation. Critical Care Consulted. Patient's sister was at the bedside briefly and confirmed her full code status.   Cased discussed with Dr. Nyoka Cowden of anesthesia and she was given naloxone without improvement of her mental status or respiratory failure. There were no issues ventilating/oxygenating her during the surgery.   Unable to obtain further history as patient is intubated and sedated.  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   has a past surgical history that includes Cholecystectomy; Cervical fusion (2004); Thyroidectomy, partial; Abdominal hysterectomy; Back surgery; Pars plana repair of retinal deatachment; Eye surgery (Left); and Robot assited laparoscopic nephroureterectomy (Left, 12/26/2020).   Significant Hospital Events: Including procedures, antibiotic start and stop dates  in addition to other pertinent events   6/15 presents to OR. Re-intubated for respiratory distress  Interim History / Subjective:   6/16 - doiung sbt . Demanding extubation. Meets extubation criteria. Not on pressors. Duplex LE - no DVT  Objective   Blood pressure (!) 129/58, pulse 92, temperature 98.4 F (36.9 C), temperature source Axillary, resp. rate (!) 22, height 5\' 7"  (1.702 m), weight 80 kg, SpO2 97 %.    Vent Mode: CPAP;PSV FiO2 (%):  [30 %-100 %] 30 % Set Rate:  [16 bmp] 16 bmp Vt Set:  [490 mL-510 mL] 510 mL PEEP:  [5 cmH20] 5 cmH20 Pressure Support:  [10 cmH20] 10 cmH20 Plateau Pressure:  [18 cmH20-21 cmH20] 18 cmH20   Intake/Output Summary (Last 24 hours) at 12/27/2020 1015 Last data filed at 12/27/2020 0848 Gross per 24 hour  Intake 3484.89 ml  Output 2480 ml  Net 1004.89 ml    Examination: General Appearance:  Looks criticall ill OBESE - yes Head:  Normocephalic, without obvious abnormality, atraumatic Eyes:  PERRL - yes, conjunctiva/corneas - muddy     Ears:  Normal external ear canals, both ears Nose:  G tube - no Throat:  ETT TUBE - yes , OG tube - yds Neck:  Supple,  No enlargement/tenderness/nodules Lungs: Clear to auscultation bilaterally, Ventilator   Synchrony - on SBT Heart:  S1 and S2 normal, no murmur, CVP - no.  Pressors - no Abdomen:  Soft, no masses, no organomegaly Genitalia / Rectal:  Not done Extremities:  Extremities- intact Skin:  ntact in exposed areas . Sacral area - not examined Neurologic:  Sedation - none -> RASS - +1 to +2 . Moves all 4s - yes. CAM-ICU - neg . Orientation - follows commands     Bedside echo performed, no signs of reduced RV or LV function. No pericardial effusion.   Labs/imaging that I have  personally reviewed  (right click and "Reselect all SmartList Selections" daily)   LABS    PULMONARY Recent Labs  Lab 12/26/20 2044  PHART 7.404  PCO2ART 37.0  PO2ART 114*  HCO3 22.7  O2SAT 97.4    CBC Recent  Labs  Lab 12/26/20 1819 12/26/20 1839 12/27/20 0303  HGB 12.2 11.7* 9.1*  HCT 40.2 38.0 28.7*  WBC  --  11.7* 9.6  PLT  --  159 105*    COAGULATION Recent Labs  Lab 12/26/20 2147  INR 1.0    CARDIAC  No results for input(s): TROPONINI in the last 168 hours. No results for input(s): PROBNP in the last 168 hours.   CHEMISTRY Recent Labs  Lab 12/26/20 1839 12/26/20 2147 12/27/20 0303  NA 139  --  134*  K 4.6  --  3.3*  CL 105  --  102  CO2 25  --  24  GLUCOSE 200*  --  273*  BUN 10  --  11  CREATININE 1.26*  --  1.23*  CALCIUM 8.1*  --  7.1*  MG  --  1.9 1.7  PHOS  --  3.2 1.8*   Estimated Creatinine Clearance: 43 mL/min (A) (by C-G formula based on SCr of 1.23 mg/dL (H)).   LIVER Recent Labs  Lab 12/26/20 1839 12/26/20 2147  AST 36  --   ALT 28  --   ALKPHOS 96  --   BILITOT 0.6  --   PROT 6.7  --   ALBUMIN 3.4*  --   INR  --  1.0     INFECTIOUS Recent Labs  Lab 12/26/20 2147  LATICACIDVEN 2.6*     ENDOCRINE CBG (last 3)  Recent Labs    12/26/20 2327 12/27/20 0349 12/27/20 0758  GLUCAP 175* 164* 131*         IMAGING x48h  - image(s) personally visualized  -   highlighted in bold DG CHEST PORT 1 VIEW  Result Date: 12/27/2020 CLINICAL DATA:  Hypoxia EXAM: PORTABLE CHEST 1 VIEW COMPARISON:  December 26, 2020 FINDINGS: Endotracheal tube tip is 3.5 cm above the carina. No pneumothorax. There are small pleural effusions bilaterally with bibasilar atelectasis. Lungs elsewhere are clear. Heart is upper normal in size with pulmonary vascularity normal. No adenopathy. No bone lesions. IMPRESSION: Endotracheal tube as described without evident pneumothorax. Small pleural effusions bilaterally with bibasilar atelectasis. Lungs elsewhere clear. Cardiac silhouette within normal limits. Electronically Signed   By: Lowella Grip III M.D.   On: 12/27/2020 07:55   DG CHEST PORT 1 VIEW  Result Date: 12/26/2020 CLINICAL DATA:  75 year old female  status post intubation. EXAM: PORTABLE CHEST 1 VIEW COMPARISON:  Chest radiograph dated 08/09/2018. FINDINGS: Endotracheal tube with tip approximately 4.5 cm above the carina. Bilateral interstitial prominence and bibasilar densities noted which may represent atelectasis or chronic changes versus less likely mild edema. Pneumonia is not excluded. Clinical correlation is recommended. No pneumothorax. The cardiac silhouette is within normal limits. No acute osseous pathology. Partially visualized cervical ACDF. IMPRESSION: 1. Endotracheal tube above the carina. 2. Bilateral interstitial densities may represent atelectasis, edema, or pneumonia. Electronically Signed   By: Anner Crete M.D.   On: 12/26/2020 19:06   VAS [place] LOWER EXTREMITY VENOUS (DVT)  Result Date: 12/26/2020  Lower Venous DVT Study Patient Name:  SHENIKA QUINT [facility]  Date of Exam:   12/26/2020 Medical Rec #: [id number]           Accession #:    [id number] Date  of Birth: 05-Sep-1945           Patient Gender: F Patient Age:   66Y Exam Location:  [facility] Procedure:      VAS [place] LOWER EXTREMITY VENOUS (DVT) Referring Phys: [id number] Conception Oms WINTER --------------------------------------------------------------------------------  Indications: Swelling bilaterally in patient with pelvic mass, LT>RT.  Limitations: Poor ultrasound/tissue interface secondary to edema. Comparison       07-23-2018 Bilateral lower extremity venous was negative for Study:           DVT. Performing Technologist: Darlin Coco RDMS,RVT  Examination Guidelines: A complete evaluation includes B-mode imaging, spectral Doppler, color Doppler, and power Doppler as needed of all accessible portions of each vessel. Bilateral testing is considered Julia integral part of a complete examination. Limited examinations for reoccurring indications may be performed as noted. The reflux portion of the exam is performed with the patient in reverse Trendelenburg.   +---------+---------------+---------+-----------+----------+-------------------+ RIGHT    CompressibilityPhasicitySpontaneityPropertiesThrombus Aging      +---------+---------------+---------+-----------+----------+-------------------+ CFV      Full           Yes      Yes                                      +---------+---------------+---------+-----------+----------+-------------------+ SFJ      Full                                                             +---------+---------------+---------+-----------+----------+-------------------+ FV Prox  Full                                                             +---------+---------------+---------+-----------+----------+-------------------+ FV Mid   Full                                                             +---------+---------------+---------+-----------+----------+-------------------+ FV DistalFull                                                             +---------+---------------+---------+-----------+----------+-------------------+ PFV      Full                                         Not well visualized +---------+---------------+---------+-----------+----------+-------------------+ POP      Full                                                             +---------+---------------+---------+-----------+----------+-------------------+  PTV      Full                                         Not well visualized +---------+---------------+---------+-----------+----------+-------------------+ PERO     Full                                         Not well visualized +---------+---------------+---------+-----------+----------+-------------------+ Gastroc  Full                                                             +---------+---------------+---------+-----------+----------+-------------------+   +---------+---------------+---------+-----------+----------+-------------------+  LEFT     CompressibilityPhasicitySpontaneityPropertiesThrombus Aging      +---------+---------------+---------+-----------+----------+-------------------+ CFV      Full           Yes      Yes                                      +---------+---------------+---------+-----------+----------+-------------------+ SFJ      Full                                                             +---------+---------------+---------+-----------+----------+-------------------+ FV Prox  Full                                                             +---------+---------------+---------+-----------+----------+-------------------+ FV Mid   Full                                                             +---------+---------------+---------+-----------+----------+-------------------+ FV DistalFull                                                             +---------+---------------+---------+-----------+----------+-------------------+ PFV      Full                                                             +---------+---------------+---------+-----------+----------+-------------------+ POP      Full           Yes      Yes                                      +---------+---------------+---------+-----------+----------+-------------------+  PTV      Full                                         Not well visualized +---------+---------------+---------+-----------+----------+-------------------+ PERO     Full                                         Not well visualized +---------+---------------+---------+-----------+----------+-------------------+     Summary: RIGHT: - There is no evidence of deep vein thrombosis in the lower extremity. However, portions of this examination were limited- see technologist comments above.  - No cystic structure found in the popliteal fossa.  LEFT: - There is no evidence of deep vein thrombosis in the lower extremity. However, portions of  this examination were limited- see technologist comments above.  - No cystic structure found in the popliteal fossa.  *See table(s) above for measurements and observations. Electronically signed by Harold Barban MD on 12/26/2020 at 7:50:15 PM.    Final      Resolved Hospital Problem list     Assessment & Plan:  Acute Hypoxemic Respiratory Failure - post op present on admit Hx of Sleep Apnea - baesline present on admit In post-op setting with history of obstructive sleep apnea, now with bilateral interstitial infiltrates. Differential includes pulmonary edema vs pneumonia (aspiration vs community acquired) vs component of hypercapnic resp failure in post op setting.   12/27/2020 -12/27/2020 - > does not meet criteria for SBT/Extubation in setting of Acute Respiratory Failure due to post op sleep apnea and anesthetic related  plan Extubate Needs CPAP QHS due to OSA Lasix x 1 Get echo rule out acute diast chf  - Levaqin + ceftriaxone for CAP/aspiration coverage for now,  = low threshold to discontinue if concern for infection dissipates+ check PCT and fever curve   Acute Kidney Injury - In post-op setting of left nephro-uretectomy vs prerenal etiology - present on admit  12/27/2020 - improved. 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 - present on admit - per Urology Dr Lovena Neighbours - PRN oxycodone per tube and PRN fentanyl IV ordered - await bx of Left kidny, ureter and splenule from 01/01/21  Diabetes Mellitus Type II - present on admit - SSI  Hypertension - baseline present on admit - hold metoprolol for now  Rheumatoid Arthritis and immunusuppsrssed - presnt on admit - continue 5mg  prednisone, hydroxychloroquine and leflunamide  Hyperlipidemia -  presnt on admit - continue atorvastatin  Depression - present on admit - continue home olanzipine and escitalopram  Best practice (right click and "Reselect all SmartList Selections" daily)  Diet:  NPO Pain/Anxiety/Delirium protocol (if indicated): Yes (RASS goal 0) VAP protocol (if indicated): Yes DVT prophylaxis: SCD GI prophylaxis: PPI Glucose control:  SSI Yes Central venous access:  N/A Arterial line:  N/A Foley:  Yes, and it is still needed Mobility:  bed rest  PT consulted: N/A Last date of multidisciplinary goals of care discussion [full code per her sister, 12/26/20] Code Status:  full code Disposition: ICU      ATTESTATION & SIGNATURE   The patient  OLIVEAH ZWACK is critically ill with multiple organ systems failure and requires high complexity decision making for assessment and support, frequent evaluation and titration of therapies, application of advanced monitoring technologies and extensive interpretation of multiple databases.   Critical Care Time devoted to patient care services described in this note is  35  Minutes. This time reflects time of care of this signee Dr Brand Males. This critical care time does not reflect procedure time, or teaching time or supervisory time of PA/NP/Med student/Med Resident etc but could involve care discussion time     Dr. Brand Males, M.D., [facility].C.P Pulmonary and Critical Care Medicine Staff Physician [facility] Pager: [phone], If no answer or between  15:00h - 7:00h: call [phone]  12/27/2020 10:34 AM

## 2020-12-27 NOTE — Evaluation (Signed)
Physical Therapy Evaluation Patient Details Name: [name] MRN: [id number] DOB: 1946-02-14 Today's Date: 12/27/2020   History of Present Illness  75 year old female presents to hospital 6/15 for scheduled left ureteral stent placement and TUR with known large left tumor within the collecting system. Intraoperative state complicated by grade 1 splenic laceration. Patient extubated, however, in PACU developed respiratory distress with oxygen saturation 53% requiring re-intubation. PMH significant for DM, HTn, RA, HLD, depression, anxiety.    Clinical Impression  [name] is 75 y.o. female admitted with above HPI and diagnosis. Patient is currently limited by functional impairments below (see PT problem list). Patient lives in a group home where she receives assistance when needed. Pt reports PTA she was independent with no device for mobility and ADL's. 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 Patient will benefit from continued skilled PT interventions to address impairments and progress independence with mobility, recommending SNF follow up for rehab. Acute PT will follow and progress as able.     Follow Up Recommendations SNF    Equipment Recommendations  Rolling walker with 5" wheels    Recommendations for Other Services       Precautions / Restrictions Precautions Precautions: Fall Restrictions Weight Bearing Restrictions: No      Mobility  Bed Mobility Overal bed mobility: Needs Assistance Bed Mobility: Supine to Sit     Supine to sit: HOB elevated;Min guard;Min assist     General bed mobility comments: min guard initially with light assist to scoot to EOB. Pt was able to initiate moving LE's off EOB and pivot with  use of bed rail.    Transfers Overall transfer level:  Needs assistance Equipment used: Rolling walker (2 wheeled) Transfers: Sit to/from [facility] Sit to Stand: Mod assist Stand pivot transfers: Mod assist;+2 safety/equipment       General transfer comment: Pt requried cues for hand placement and mod assit to initiate power up from EOB. Pt's LE's buckling in standing requiring mod assit to steady and prevent LOB, pt returned to sit EOB. 2+ mod assist with manual blocking of LE's to facilitate knee extension and prevent buckling required for stand pivot to move bed>recliner. VSS throughout.  Ambulation/Gait                Stairs            Wheelchair Mobility    Modified Rankin (Stroke Patients Only)       Balance Overall balance assessment: Needs assistance Sitting-balance support: Feet supported Sitting balance-Leahy Scale: Good     Standing balance support: During functional activity;Bilateral upper extremity supported Standing balance-Leahy Scale: Poor Standing balance comment: heavy reliance on UE support with RW and external support of therapist                             Pertinent Vitals/Pain Pain Assessment: No/denies pain    Home Living Family/patient expects to be discharged to:: Private residence Living Arrangements: Group Home Available Help at Discharge:  Suanne Marker an dher husband, they do all homemaking) Type of Home: House Home Access: Stairs to enter Entrance Stairs-Rails: Psychiatric nurse of Steps: 5 Home Layout: One level Home Equipment: Grab bars - tub/shower;Cane - single point Additional Comments: [facility] group home  Prior Function Level of Independence: Independent with assistive device(s)               Hand Dominance   Dominant Hand: Right    Extremity/Trunk Assessment   Upper Extremity Assessment Upper Extremity Assessment: Defer to OT evaluation    Lower Extremity Assessment Lower Extremity Assessment: Generalized weakness     Cervical / Trunk Assessment Cervical / Trunk Assessment: Normal  Communication   Communication: No difficulties  Cognition Arousal/Alertness: Awake/alert Behavior During Therapy: WFL for tasks assessed/performed Overall Cognitive Status: Within Functional Limits for tasks assessed                                        General Comments      Exercises     Assessment/Plan    PT Assessment Patient needs continued PT services  PT Problem List Decreased strength;Decreased activity tolerance;Decreased balance;Decreased mobility;Decreased knowledge of use of DME;Decreased safety awareness       PT Treatment Interventions DME instruction;Gait training;Stair training;Functional mobility training;Therapeutic activities;Therapeutic exercise;Balance training;Neuromuscular re-education;Patient/family education    PT Goals (Current goals can be found in the Care Plan section)  Acute Rehab PT Goals Patient Stated Goal: get back to walking independently PT Goal Formulation: With patient Time For Goal Achievement: 01/10/21 Potential to Achieve Goals: Good    Frequency Min 3X/week   Barriers to discharge        Co-evaluation               AM-PAC PT "6 Clicks" Mobility  Outcome Measure Help needed turning from your back to your side while in a flat bed without using bedrails?: A Little Help needed moving from lying on your back to sitting on the side of a flat bed without using bedrails?: A Little Help needed moving to and from a bed to a chair (including a wheelchair)?: A Lot Help needed standing up from a chair using your arms (e.g., wheelchair or bedside chair)?: A Lot Help needed to walk in hospital room?: Total Help needed climbing 3-5 steps with a railing? : Total 6 Click Score: 12    End of Session Equipment Utilized During Treatment: Gait belt Activity Tolerance: Patient tolerated treatment well Patient left: in chair;with call bell/phone within  reach;with chair alarm set Nurse Communication: Mobility status PT Visit Diagnosis: Muscle weakness (generalized) (M62.81);Difficulty in walking, not elsewhere classified (R26.2);Unsteadiness on feet (R26.81);Other abnormalities of gait and mobility (R26.89)    Time: 1725-1757 PT Time Calculation (min) (ACUTE ONLY): 32 min   Charges:   PT Evaluation $PT Eval Moderate Complexity: 1 Mod PT Treatments $Therapeutic Activity: 8-22 mins        Verner Mould, DPT Acute Rehabilitation Services Office [phone] Pager [phone]   Jacques Navy 12/27/2020, 6:21 PM

## 2020-12-27 NOTE — TOC Initial Note (Signed)
Transition of Care [facility]) - Initial/Assessment Note    Patient Details  Name: [name] MRN: [id number] Date of Birth: [birth year]  Transition of Care [facility]) CM/SW Contact:    Leeroy Cha, RN Phone Number: 12/27/2020, 8:42 AM  Clinical Narrative:                 75 year old female presents to hospital 6/15 for scheduled left ureteral stent placement and TUR with known large left tumor within the collecting system. 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 Underwent diagnostic ureteroscopy 3/23 with biopsy pathology inconclusive.   Intraoperative state complicated by grade 1 splenic laceration. Patient extubated. However, in PACU developed respiratory distress with oxygen saturation 53% requiring re-intubation. Critical Care Consulted.    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 Re-intubated for respiratory distress PLAN: following for progression, no toc needs present hopefully can return to home with self care.  Expected Discharge Plan: Home/Self Care     Patient Goals and CMS Choice Patient states their goals for this hospitalization and ongoing recovery are:: unable to state intubated [id number]      Expected Discharge Plan and Services Expected Discharge Plan: Home/Self Care   Discharge Planning Services: CM Consult   Living arrangements for the past 2 months: Single Family Home                                      Prior Living Arrangements/Services Living arrangements for the past 2 months: Single Family Home Lives with:: Self                   Activities of Daily Living      Permission Sought/Granted                   Emotional Assessment   Attitude/Demeanor/Rapport: Unable to Assess Affect (typically observed): Unable to Assess Orientation: : Fluctuating Orientation (Suspected and/or reported Sundowners) Alcohol / Substance Use: Not Applicable Psych Involvement: No (comment)  Admission diagnosis:  Renal mass [N28.89] Patient Active Problem List   Diagnosis Date Noted   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   MDD (major depressive disorder), recurrent episode, severe ([place]) 08/10/2018   MDD (major depressive disorder), recurrent, severe, with psychosis ([place])    Altered mental status 05/24/2018   Ketonuria 05/24/2018   Pain in right hand 09/11/2017   Hypertension 03/07/2014   Hyperlipidemia 03/07/2014   Depression 03/07/2014   Spinal stenosis, lumbar region, with neurogenic claudication 02/24/2014   Type 2 diabetes mellitus without complication ([place]) 93/81/0175   Rheumatoid arthritis ([place]) 02/24/2014   Scoliosis of lumbar spine 02/13/2014   PCP:  Aretta Nip, MD Pharmacy:   [id number] - [place], [place]. [address] [place] [address] Phone: [phone] Fax: [phone]  [place], [address] [place] [address] Phone: [phone] Fax: [phone]     Social Determinants of Health (SDOH) Interventions    Readmission Risk Interventions No flowsheet data found.

## 2020-12-27 NOTE — Progress Notes (Signed)
[facility] ADULT ICU REPLACEMENT PROTOCOL   The patient does apply for the [facility] Adult ICU Electrolyte Replacment Protocol based on the criteria listed below:   1. Is GFR >/= 30 ml/min? Yes.    Patient's GFR today is 46 2. Is SCr </= 2? Yes.   Patient's SCr is 1.23 ml/kg/hr 3. Did SCr increase >/= 0.5 in 24 hours? No. 4. Abnormal electrolyte(s): mag 1.7, k+ 3.3 5. Ordered repletion with: standing orders 6. If a panic level lab has been reported, has the CCM MD in charge been notified? No..    Darlys Gales 12/27/2020 6:19 AM

## 2020-12-27 NOTE — Evaluation (Signed)
Clinical/Bedside Swallow Evaluation Patient Details  Name: [name] MRN: [id number] Date of Birth: Apr 11, 1946  Today's Date: 12/27/2020 Time: SLP Start Time (ACUTE ONLY): 4166 SLP Stop Time (ACUTE ONLY): 1905 SLP Time Calculation (min) (ACUTE ONLY): 30 min  Past Medical History:  Past Medical History:  Diagnosis Date   Anxiety    Arthritis    rhematoid,osteoarthritis   Diabetes mellitus without complication (HCC)    Dyspnea    increased exertion   Elevated cholesterol    Hemorrhoids    Hypertension    Hypothyroidism    Joint pain    Nocturia    Sleeping difficulties    Past Surgical History:  Past Surgical History:  Procedure Laterality Date   ABDOMINAL HYSTERECTOMY     BACK SURGERY     CERVICAL FUSION  2004   CHOLECYSTECTOMY     EYE SURGERY Left    cataract   PARS PLANA REPAIR OF RETINAL DEATACHMENT     ROBOT ASSITED LAPAROSCOPIC NEPHROURETERECTOMY Left 12/26/2020   Procedure: XI ROBOT ASSITED LAPAROSCOPIC  LEFT NEPHROURETERECTOMY/ STENT PLACEMENT/ TRANSURETHRAL RESECTION OF LEFT URETERAL ORIFICE;EXCISION OF SPLENULE;  Surgeon: Ceasar Mons, MD;  Location: [facility];  Service: Urology;  Laterality: Left;   THYROIDECTOMY, PARTIAL     HPI:  75 year old female presents to hospital 6/15 for scheduled left ureteral stent placement and TUR with known large left tumor within the collecting system. Intraoperative state complicated by grade 1 splenic laceration. Patient extubated, however, in PACU developed respiratory distress with oxygen saturation 53% requiring re-intubation. PMH significant for DM, HTN, RA, HLD, depression, and anxiety.  Pt has h/o cervical spine fusion C4-C7 and partial thyroidectomy - she denies dysphagia associated with these procedures.  Pt does admit to dysphagia to pills and some solid foods "for years".  Currently partial is not present as pt reports the group home staff *where she resides* advised her to leave it at home.  Pt reports she  has informed md in the past to her dysphagia but she has not had this evaluated. She denies requiring heimlich, having weight loss nor recurrent pnas.   Assessment / Plan / Recommendation Clinical Impression  Pt reports chronic dysphagia to some solids and pills causing her to be careful due to "fear of getting choked".  Voice is mildly dysphonic, no focal CN deficits and cough is strong.  She does appear with excessive lingual movement  - ? consistent wtih tardive dyskinesia?  She reports having her psychotropic medications changed frequently due to side effects.    Subtle throat clearing and cough noted post-swallow - mostly with straw boluses of thin liquids.  ? mild discoordination of swallow that appeared to impact control of liquids more than solids.   No evidence of difficulties/oral retention with solids including 4 crackers, 4 ounce of pudding.  Subtle cough noted with thin liquids via straw - abated significantly with use of cup - to only x1/9 boluses.  Pt uses caution with intake, taking small sips especially.  No change in vitals, increase WOB, decrease oxygen saturation noted.   Pt reports voice is note at baseline yet *6/10 - 10 normal but senses her swallow ability is at baseline.  Reviewed potential short term acute changes with reintubation and thus need to use caution.   Will follow up briefly to assure tolerance of po with hopeful swallow to return to baseline as potential acute edema from reintubation resolves.  Recommend dys3/thin - pt's partial is at her group home.  Using teach back with written signs education initiated and SLP provided pt with tissues/basin for secretion clearance.  Thanks for this consult. SLP Visit Diagnosis: Dysphagia, unspecified (R13.10)    Aspiration Risk  Mild aspiration risk    Diet Recommendation Dysphagia 3 (Mech soft);Thin liquid   Liquid Administration via: Cup;No straw Medication Administration: Whole meds with puree Supervision:  Patient able to self feed Compensations: Small sips/bites;Slow rate Postural Changes: Seated upright at 90 degrees;Remain upright for at least 30 minutes after po intake    Other  Recommendations Oral Care Recommendations: Oral care before and after PO   Follow up Recommendations        Frequency and Duration min 1 x/week  2 weeks       Prognosis Prognosis for Safe Diet Advancement: Fair      Swallow Study   General Date of Onset: 12/27/20 HPI: 75 year old female presents to hospital 6/15 for scheduled left ureteral stent placement and TUR with known large left tumor within the collecting system. Intraoperative state complicated by grade 1 splenic laceration. Patient extubated, however, in PACU developed respiratory distress with oxygen saturation 53% requiring re-intubation. PMH significant for DM, HTN, RA, HLD, depression, and anxiety.  Pt has h/o cervical spine fusion C4-C7 and partial thyroidectomy - she denies dysphagia associated with these procedures.  Pt does admit to dysphagia to pills and some solid foods "for years".  Currently partial is not present as pt reports the group home staff *where she resides* advised her to leave it at home.  Pt reports she has informed md in the past to her dysphagia but she has not had this evaluated. She denies requiring heimlich, having weight loss nor recurrent pnas. Type of Study: Bedside Swallow Evaluation Previous Swallow Assessment: none in epic Diet Prior to this Study: Thin liquids (clears) Temperature Spikes Noted: No Respiratory Status: Nasal cannula History of Recent Intubation: Yes Length of Intubations (days): 1 days Date extubated: 12/27/20 (intubated for surgery - reintubated in pacu) Behavior/Cognition: Alert;Cooperative;Pleasant mood Oral Cavity Assessment: Within Functional Limits Oral Care Completed by SLP: No Oral Cavity - Dentition: Adequate natural dentition;Other (Comment) (partial at group home) Vision: Functional for  self-feeding Self-Feeding Abilities: Able to feed self Patient Positioning: Upright in bed Baseline Vocal Quality: Hoarse (pt assigns voice 6/10 - 10 being normal voice) Volitional Cough: Strong Volitional Swallow: Able to elicit    Oral/Motor/Sensory Function Overall Oral Motor/Sensory Function: Other (comment) (appearance of intermittent excessive lingual movement, ? consistent with tardive dyskinesia)   Ice Chips Ice chips: Not tested   Thin Liquid Thin Liquid: Impaired Presentation: Self Fed;Cup;Straw Pharyngeal  Phase Impairments: Throat Clearing - Immediate;Cough - Delayed;Wet Vocal Quality Other Comments: immediate throat clearing, with first few boluses; wet voice did not clear with cued throat clear/couh =- suspect secretion retention; cough x 2/9 boluses of thin via straw and 1/10 via cup; cough abated with use of cup vs straw and as intake progressed    Nectar Thick Nectar Thick Liquid: Impaired Presentation: Cup;Self Fed;Straw Pharyngeal Phase Impairments: Cough - Delayed Other Comments: delayed cough x1 only of approximately 10 boluses via straw and no cough with approx 8 boluses via cup   Honey Thick Honey Thick Liquid: Not tested   Puree Puree: Within functional limits Presentation: Self Fed;Spoon   Solid     Solid: Within functional limits Presentation: Self Fed Other Comments: Provided pt with pudding to moisten graham crackers for po given partials being at home, Swallow appeared swift without oral retention.  Macario Golds 12/27/2020,8:02 PM  Kathleen Lime, MS [facility] SLP Acute Rehab Services Office [phone] Pager [phone]

## 2020-12-27 NOTE — Progress Notes (Signed)
1 Day Post-Op Subjective: Patient still intubated following acute respiratory failure in the PACU.  Aspiratory status has progressively improved overnight.  The respiratory therapist at bedside is planning for extubation at some point this morning.  Objective: Vital signs in last 24 hours: Temp:  [97 F (36.1 C)-98.4 F (36.9 C)] 98.4 F (36.9 C) (06/16 0751) Pulse Rate:  [65-121] 89 (06/16 1102) Resp:  [7-25] 23 (06/16 1102) BP: (99-232)/(41-177) 167/139 (06/16 1102) SpO2:  [71 %-100 %] 95 % (06/16 1102) FiO2 (%):  [30 %-100 %] 30 % (06/16 0754) Weight:  [80 kg] 80 kg (06/16 0500)  Intake/Output from previous day: 06/15 0701 - 06/16 0700 In: 2826.4 [I.V.:2288.3; IV Piggyback:538.1] Out: 2460 [Urine:2250; Drains:30; Blood:180]  Intake/Output this shift: Total I/O In: 658.5 [I.V.:437.7; IV Piggyback:220.8] Out: 570 [Urine:550; Drains:20]  Physical Exam:  General: Intubated, but able to follow commands CV: RRR, palpable distal pulses Lungs: CTAB, equal chest rise Abdomen: Soft, NTND, no rebound or guarding Incisions: Clean, dry and intact Gu: Foley catheter in place and draining amber urine Ext: NT, No erythema  Lab Results: Recent Labs    12/26/20 1819 12/26/20 1839 12/27/20 0303  HGB 12.2 11.7* 9.1*  HCT 40.2 38.0 28.7*   BMET Recent Labs    12/26/20 1839 12/27/20 0303  NA 139 134*  K 4.6 3.3*  CL 105 102  CO2 25 24  GLUCOSE 200* 273*  BUN 10 11  CREATININE 1.26* 1.23*  CALCIUM 8.1* 7.1*     Studies/Results: DG CHEST PORT 1 VIEW  Result Date: 12/27/2020 CLINICAL DATA:  Hypoxia EXAM: PORTABLE CHEST 1 VIEW COMPARISON:  December 26, 2020 FINDINGS: Endotracheal tube tip is 3.5 cm above the carina. No pneumothorax. There are small pleural effusions bilaterally with bibasilar atelectasis. Lungs elsewhere are clear. Heart is upper normal in size with pulmonary vascularity normal. No adenopathy. No bone lesions. IMPRESSION: Endotracheal tube as described without  evident pneumothorax. Small pleural effusions bilaterally with bibasilar atelectasis. Lungs elsewhere clear. Cardiac silhouette within normal limits. Electronically Signed   By: Lowella Grip III M.D.   On: 12/27/2020 07:55   DG CHEST PORT 1 VIEW  Result Date: 12/26/2020 CLINICAL DATA:  75 year old female status post intubation. EXAM: PORTABLE CHEST 1 VIEW COMPARISON:  Chest radiograph dated 08/09/2018. FINDINGS: Endotracheal tube with tip approximately 4.5 cm above the carina. Bilateral interstitial prominence and bibasilar densities noted which may represent atelectasis or chronic changes versus less likely mild edema. Pneumonia is not excluded. Clinical correlation is recommended. No pneumothorax. The cardiac silhouette is within normal limits. No acute osseous pathology. Partially visualized cervical ACDF. IMPRESSION: 1. Endotracheal tube above the carina. 2. Bilateral interstitial densities may represent atelectasis, edema, or pneumonia. Electronically Signed   By: Anner Crete M.D.   On: 12/26/2020 19:06   VAS [place] LOWER EXTREMITY VENOUS (DVT)  Result Date: 12/26/2020  Lower Venous DVT Study Patient Name:  [name] [facility]  Date of Exam:   12/26/2020 Medical Rec #: [id number]           Accession #:    [id number] Date of Birth: 1945/09/05           Patient Gender: F Patient Age:   75Y Exam Location:  [facility] Procedure:      VAS [place] LOWER EXTREMITY VENOUS (DVT) Referring Phys: [id number] [place] --------------------------------------------------------------------------------  Indications: Swelling bilaterally in patient with pelvic mass, LT>RT.  Limitations: Poor ultrasound/tissue interface secondary to edema. Comparison       07-23-2018 Bilateral  lower extremity venous was negative for Study:           DVT. Performing Technologist: Darlin Coco RDMS,RVT  Examination Guidelines: A complete evaluation includes B-mode imaging, spectral Doppler, color Doppler, and power  Doppler as needed of all accessible portions of each vessel. Bilateral testing is considered an integral part of a complete examination. Limited examinations for reoccurring indications may be performed as noted. The reflux portion of the exam is performed with the patient in reverse Trendelenburg.  +---------+---------------+---------+-----------+----------+-------------------+ RIGHT    CompressibilityPhasicitySpontaneityPropertiesThrombus Aging      +---------+---------------+---------+-----------+----------+-------------------+ CFV      Full           Yes      Yes                                      +---------+---------------+---------+-----------+----------+-------------------+ SFJ      Full                                                             +---------+---------------+---------+-----------+----------+-------------------+ FV Prox  Full                                                             +---------+---------------+---------+-----------+----------+-------------------+ FV Mid   Full                                                             +---------+---------------+---------+-----------+----------+-------------------+ FV DistalFull                                                             +---------+---------------+---------+-----------+----------+-------------------+ PFV      Full                                         Not well visualized +---------+---------------+---------+-----------+----------+-------------------+ POP      Full                                                             +---------+---------------+---------+-----------+----------+-------------------+ PTV      Full                                         Not well visualized +---------+---------------+---------+-----------+----------+-------------------+ PERO     Full  Not well visualized  +---------+---------------+---------+-----------+----------+-------------------+ Gastroc  Full                                                             +---------+---------------+---------+-----------+----------+-------------------+   +---------+---------------+---------+-----------+----------+-------------------+ LEFT     CompressibilityPhasicitySpontaneityPropertiesThrombus Aging      +---------+---------------+---------+-----------+----------+-------------------+ CFV      Full           Yes      Yes                                      +---------+---------------+---------+-----------+----------+-------------------+ SFJ      Full                                                             +---------+---------------+---------+-----------+----------+-------------------+ FV Prox  Full                                                             +---------+---------------+---------+-----------+----------+-------------------+ FV Mid   Full                                                             +---------+---------------+---------+-----------+----------+-------------------+ FV DistalFull                                                             +---------+---------------+---------+-----------+----------+-------------------+ PFV      Full                                                             +---------+---------------+---------+-----------+----------+-------------------+ POP      Full           Yes      Yes                                      +---------+---------------+---------+-----------+----------+-------------------+ PTV      Full                                         Not well visualized +---------+---------------+---------+-----------+----------+-------------------+ PERO     Full  Not well visualized +---------+---------------+---------+-----------+----------+-------------------+      Summary: RIGHT: - There is no evidence of deep vein thrombosis in the lower extremity. However, portions of this examination were limited- see technologist comments above.  - No cystic structure found in the popliteal fossa.  LEFT: - There is no evidence of deep vein thrombosis in the lower extremity. However, portions of this examination were limited- see technologist comments above.  - No cystic structure found in the popliteal fossa.  *See table(s) above for measurements and observations. Electronically signed by Harold Barban MD on 12/26/2020 at 7:50:15 PM.    Final     Assessment/Plan: Postop day 1 status post robotic assisted left nephro ureterectomy.  The patient had acute respiratory failure postoperatively and required reintubation in the PACU.  -Wean to extubate per critical care team -Once the patient is extubated, will progressively advance her diet -Physical therapy consulted to assist in postop ambulation.  Continue heparin for DVT prophylaxis -Keep Foley catheter in place -We will continue to monitor.  Appreciate critical care and respiratory therapy team   LOS: 1 day   Ellison Hughs, MD [facility] Pager: [phone]  12/27/2020, 12:06 PM

## 2020-12-27 NOTE — Progress Notes (Signed)
NUTRITION NOTE  Patient screened for new vent. She is POD #1 cystoscopy, L ureteral stent placement, TUR of the L ureteral orifice, and robot-assisted lap L nephro-ureterectomy.   Patient was extubated today at ~1035. She remains NPO at this time.   Patient did not screen for any other nutrition-related reason or needs beyond mechanical ventilation.  RD will not follow at this time. If nutrition-related needs arise, please consult RD.      Jarome Matin, MS, RD, LDN, CNSC Inpatient Clinical Dietitian RD pager # available in [place]  After hours/weekend pager # available in [facility]

## 2020-12-27 NOTE — Progress Notes (Signed)
This nurse and [place] both have attempted an OG insertion. Patient not able to handle the procedure. Will get day shift to attempt one.

## 2020-12-27 NOTE — Procedures (Signed)
Extubation Procedure Note  Patient Details:   Name: [name] DOB: [birth year] MRN: [id number]   Airway Documentation:  Airway 7 mm (Active)  Secured at (cm) 24 cm 12/27/20 0800  Measured From Lips 12/27/20 [name] 12/27/20 0750  Secured By [name] 12/27/20 0750  Tube Holder Repositioned Yes 12/27/20 0750  Prone position No 12/27/20 0750   Vent end date: 12/27/20 Vent end time: 1035   Evaluation  O2 sats: stable throughout Complications: No apparent complications Patient did tolerate procedure well. Bilateral Breath Sounds: Diminished   Yes  Johnette Abraham 12/27/2020, 10:35 AM

## 2020-12-28 ENCOUNTER — Inpatient Hospital Stay (HOSPITAL_COMMUNITY): Payer: Medicare Other

## 2020-12-28 DIAGNOSIS — R0603 Acute respiratory distress: Secondary | ICD-10-CM

## 2020-12-28 DIAGNOSIS — J9601 Acute respiratory failure with hypoxia: Secondary | ICD-10-CM | POA: Diagnosis not present

## 2020-12-28 LAB — COMPREHENSIVE METABOLIC PANEL
ALT: 17 U/L (ref 0–44)
AST: 35 U/L (ref 15–41)
Albumin: 3 g/dL — ABNORMAL LOW (ref 3.5–5.0)
Alkaline Phosphatase: 62 U/L (ref 38–126)
Anion gap: 10 (ref 5–15)
BUN: 12 mg/dL (ref 8–23)
CO2: 26 mmol/L (ref 22–32)
Calcium: 6.7 mg/dL — ABNORMAL LOW (ref 8.9–10.3)
Chloride: 100 mmol/L (ref 98–111)
Creatinine, Ser: 1.39 mg/dL — ABNORMAL HIGH (ref 0.44–1.00)
GFR, Estimated: 40 mL/min — ABNORMAL LOW (ref >60)
Glucose, Bld: 125 mg/dL — ABNORMAL HIGH (ref 70–99)
Potassium: 4.3 mmol/L (ref 3.5–5.1)
Sodium: 136 mmol/L (ref 135–145)
Total Bilirubin: 0.3 mg/dL (ref 0.3–1.2)
Total Protein: 5.4 g/dL — ABNORMAL LOW (ref 6.5–8.1)

## 2020-12-28 LAB — GLUCOSE, CAPILLARY
Glucose-Capillary: 107 mg/dL — ABNORMAL HIGH (ref 70–99)
Glucose-Capillary: 89 mg/dL (ref 70–99)
Glucose-Capillary: 133 mg/dL — ABNORMAL HIGH (ref 70–99)
Glucose-Capillary: 128 mg/dL — ABNORMAL HIGH (ref 70–99)
Glucose-Capillary: 125 mg/dL — ABNORMAL HIGH (ref 70–99)

## 2020-12-28 LAB — ECHOCARDIOGRAM COMPLETE
AR max vel: 1.78 cm2
AV Area VTI: 1.72 cm2
AV Area mean vel: 1.74 cm2
AV Mean grad: 3.5 mmHg
AV Peak grad: 6.9 mmHg
Ao pk vel: 1.31 m/s
Area-P 1/2: 5.23 cm2
Height: 67 in
S' Lateral: 2.6 cm
Weight: 2821.89 oz

## 2020-12-28 LAB — CBC
HCT: 27.6 % — ABNORMAL LOW (ref 36.0–46.0)
Hemoglobin: 8.6 g/dL — ABNORMAL LOW (ref 12.0–15.0)
MCH: 30.9 pg (ref 26.0–34.0)
MCHC: 31.2 g/dL (ref 30.0–36.0)
MCV: 99.3 fL (ref 80.0–100.0)
Platelets: 109 10*3/uL — ABNORMAL LOW (ref 150–400)
RBC: 2.78 MIL/uL — ABNORMAL LOW (ref 3.87–5.11)
RDW: 16.1 % — ABNORMAL HIGH (ref 11.5–15.5)
WBC: 9.8 10*3/uL (ref 4.0–10.5)
nRBC: 0 % (ref 0.0–0.2)

## 2020-12-28 LAB — TROPONIN I (HIGH SENSITIVITY): Troponin I (High Sensitivity): 37 ng/L — ABNORMAL HIGH (ref <18)

## 2020-12-28 LAB — PROCALCITONIN: Procalcitonin: 0.44 ng/mL

## 2020-12-28 LAB — MAGNESIUM: Magnesium: 2.1 mg/dL (ref 1.7–2.4)

## 2020-12-28 LAB — LACTIC ACID, PLASMA: Lactic Acid, Venous: 1.7 mmol/L (ref 0.5–1.9)

## 2020-12-28 LAB — PHOSPHORUS: Phosphorus: 4.1 mg/dL (ref 2.5–4.6)

## 2020-12-28 MED ORDER — FUROSEMIDE 10 MG/ML IJ SOLN
20.0000 mg | Freq: Once | INTRAMUSCULAR | Status: AC
  Administered 2020-12-28: 20 mg via INTRAVENOUS
  Filled 2020-12-28: qty 2

## 2020-12-28 MED ORDER — INSULIN ASPART 100 UNIT/ML IJ SOLN: 0.0000 [IU] | Freq: Every day | INTRAMUSCULAR | Status: DC

## 2020-12-28 MED ORDER — INSULIN ASPART 100 UNIT/ML IJ SOLN
0.0000 [IU] | Freq: Three times a day (TID) | INTRAMUSCULAR | Status: DC
  Administered 2020-12-28 – 2020-12-30 (×4): 1 [IU] via SUBCUTANEOUS
  Administered 2020-12-30: 2 [IU] via SUBCUTANEOUS
  Administered 2021-01-01: 1 [IU] via SUBCUTANEOUS

## 2020-12-28 NOTE — NC FL2 (Signed)
[place] LEVEL OF CARE SCREENING TOOL     IDENTIFICATION  Patient Name: [name] Birthdate: 1945-12-10 Sex: female Admission Date (Current Location): 12/26/2020  [facility] and [place] Number:  Herbalist and Address:  [facility],  [place], [place]      Provider Number: [id number]  Attending Physician Name and Address:  Dorise Hiss*  Relative Name and Phone Number:       Current Level of Care: Hospital Recommended Level of Care: [place] Prior Approval Number:    Date Approved/Denied:   PASRR Number: [id number]  Discharge Plan: SNF    Current Diagnoses: Patient Active Problem List   Diagnosis Date Noted   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   MDD (major depressive disorder), recurrent episode, severe ([place]) 08/10/2018   MDD (major depressive disorder), recurrent, severe, with psychosis ([place])    Altered mental status 05/24/2018   Ketonuria 05/24/2018   Pain in right hand 09/11/2017   Hypertension 03/07/2014   Hyperlipidemia 03/07/2014   Depression 03/07/2014   Spinal stenosis, lumbar region, with neurogenic claudication 02/24/2014   Type 2 diabetes mellitus without complication ([place]) 41/63/8453   Rheumatoid arthritis ([place]) 02/24/2014   Scoliosis of lumbar spine 02/13/2014    Orientation RESPIRATION BLADDER Height & Weight     Self, Time, Situation, Place  O2 Continent Weight: 80 kg Height:  5\' 7"  (170.2 cm)  BEHAVIORAL SYMPTOMS/MOOD NEUROLOGICAL BOWEL NUTRITION STATUS      Continent Diet (regular)  AMBULATORY STATUS COMMUNICATION OF NEEDS Skin   Extensive Assist Verbally Normal                       Personal Care Assistance Level of Assistance  Bathing, Feeding, Dressing Bathing Assistance: Limited assistance Feeding assistance: Limited assistance Dressing Assistance: Limited assistance     Functional Limitations Info  Sight, Hearing, Speech Sight Info:  Adequate Hearing Info: Adequate Speech Info: Adequate    SPECIAL CARE FACTORS FREQUENCY  PT (By licensed PT), OT (By licensed OT)     PT Frequency: 5 x weekly OT Frequency: 5 x weekly            Contractures Contractures Info: Not present    Additional Factors Info  Code Status Code Status Info: full             Current Medications (12/28/2020):  This is the current hospital active medication list Current Facility-Administered Medications  Medication Dose Route Frequency Provider Last Rate Last Admin   acetaminophen (TYLENOL) tablet 650 mg  650 mg Oral Q6H PRN [place] Crane M, PA-C       belladonna-opium (B&O) suppository 16.2-60mg   1 suppository Rectal Q6H PRN Debbrah Alar, PA-C       benztropine (COGENTIN) tablet 0.5 mg  0.5 mg Oral BID Polly Cobia, RPH   0.5 mg at 12/27/20 2109   ceFEPIme (MAXIPIME) 2 g in sodium chloride 0.9 % 100 mL IVPB  2 g Intravenous Q12H Brand Males, MD   Stopped at 12/27/20 2138   chlorhexidine gluconate (MEDLINE KIT) (PERIDEX) 0.12 % solution 15 mL  15 mL Mouth Rinse BID Ceasar Mons, MD   15 mL at 12/27/20 0759   Chlorhexidine Gluconate Cloth 2 % PADS 6 each  6 each Topical Daily Ceasar Mons, MD   6 each at 12/27/20 2109   diphenhydrAMINE (BENADRYL) 12.5 MG/5ML elixir 12.5 mg  12.5 mg Oral Q6H PRN Polly Cobia, [facility]  Or   diphenhydrAMINE (BENADRYL) injection 12.5 mg  12.5 mg Intravenous Q6H PRN Wofford, Drew A, RPH       docusate sodium (COLACE) capsule 100 mg  100 mg Oral BID PRN Freddi Starr, MD       docusate sodium (COLACE) capsule 100 mg  100 mg Oral BID Polly Cobia, RPH   100 mg at 12/27/20 2109   escitalopram (LEXAPRO) tablet 10 mg  10 mg Oral Daily Wofford, Cindie Laroche, RPH       hydroxychloroquine (PLAQUENIL) tablet 400 mg  400 mg Oral Daily Wofford, Drew A, RPH       insulin aspart (novoLOG) injection 0-5 Units  0-5 Units Subcutaneous QHS Anders Simmonds, MD       insulin aspart  (novoLOG) injection 0-9 Units  0-9 Units Subcutaneous TID WC Anders Simmonds, MD       metoprolol tartrate (LOPRESSOR) tablet 12.5 mg  12.5 mg Oral BID Polly Cobia, RPH   12.5 mg at 12/27/20 2109   OLANZapine (ZYPREXA) tablet 2.5 mg  2.5 mg Oral Daily Polly Cobia, RPH       ondansetron (ZOFRAN) injection 4 mg  4 mg Intravenous Q4H PRN Debbrah Alar, PA-C       oxyCODONE-acetaminophen (PERCOCET/ROXICET) 5-325 MG per tablet 1 tablet  1 tablet Oral Q4H PRN Ceasar Mons, MD   1 tablet at 12/27/20 1307   pantoprazole (PROTONIX) injection 40 mg  40 mg Intravenous QHS Freda Jackson B, MD   40 mg at 12/27/20 2109   polyethylene glycol (MIRALAX / GLYCOLAX) packet 17 g  17 g Oral Daily PRN Freddi Starr, MD       polyethylene glycol (MIRALAX / GLYCOLAX) packet 17 g  17 g Oral Daily Wofford, Drew A, RPH       predniSONE (DELTASONE) tablet 5 mg  5 mg Per Tube Q breakfast Debbrah Alar, PA-C   5 mg at 12/27/20 1111   rOPINIRole (REQUIP) tablet 0.25 mg  0.25 mg Oral QHS [place] Crane M, PA-C   0.25 mg at 12/27/20 2109     Discharge Medications: Please see discharge summary for a list of discharge medications.  Relevant Imaging Results:  Relevant Lab Results:   Additional Information SS# [id number]    Leeroy Cha, RN

## 2020-12-28 NOTE — TOC Progression Note (Signed)
Transition of Care [facility]) - Progression Note    Patient Details  Name: [name] MRN: [id number] Date of Birth: [birth year]  Transition of Care [facility]) CM/SW Contact  Leeroy Cha, RN Phone Number: 12/28/2020, 8:56 AM  Clinical Narrative:    [place] number [id number] DHD:[phone] Fl2 note completed not sent out due to condition of the patient. Will follow to see when fl2 can be faxed out.   Expected Discharge Plan: Home/Self Care    Expected Discharge Plan and Services Expected Discharge Plan: Home/Self Care   Discharge Planning Services: CM Consult   Living arrangements for the past 2 months: Single Family Home                                       Social Determinants of Health (SDOH) Interventions    Readmission Risk Interventions No flowsheet data found.

## 2020-12-28 NOTE — Progress Notes (Signed)
RT inquire about the CPAP and tried to place it on the pt. Pt said take it off right now. She became anxious and nervous. CPAP standby.

## 2020-12-28 NOTE — Progress Notes (Signed)
[place] Progress Note Patient Name: [name] DOB: [birth year] MRN: [id number]   Date of Service  12/28/2020  HPI/Events of Note  Patient is now eating a diet.   eICU Interventions  Plan: D/C Q 4 hour sensitive Novolog SSI. AC/HS sensitive Novolog SSI D/C D5 0.45 NaCl IV infusion.      Intervention Category Major Interventions: Hyperglycemia - active titration of insulin therapy  Lysle Dingwall 12/28/2020, 4:19 AM

## 2020-12-28 NOTE — Progress Notes (Signed)
2 Days Post-Op Subjective: Patient reports pain control adequate.  She is hungry but has had no return of bowel function.  She worked with PT yesterday who is recommending SNF placement for rehab. On 1L via [place] and is breathing easily with no distress.    Objective: Vital signs in last 24 hours: Temp:  [97.8 F (36.6 C)-98.7 F (37.1 C)] 97.8 F (36.6 C) (06/17 0844) Pulse Rate:  [70-99] 99 (06/17 1000) Resp:  [18-31] 31 (06/17 1000) BP: (95-167)/(16-139) 95/16 (06/17 1000) SpO2:  [91 %-98 %] 93 % (06/17 1000) Weight:  [80 kg] 80 kg (06/17 0418)  Intake/Output from previous day: 06/16 0701 - 06/17 0700 In: 2801.8 [I.V.:1897.6; IV Piggyback:904.3] Out: 2510 [Urine:2400; Drains:110] Intake/Output this shift: No intake/output data recorded.  Physical Exam:  General:alert, cooperative, and no distress Cardiovascular: RRR Lungs: CTA GI: soft, mildly tender, faint bowel sounds, no palpable masses Incisions: C/D/I Urine: clear Extremities: pitting edema bilateral with excoriations from mid shin extending distally to feet; no pustules or drainage  Lab Results: Recent Labs    12/26/20 1839 12/27/20 0303 12/28/20 0310  HGB 11.7* 9.1* 8.6*  HCT 38.0 28.7* 27.6*   BMET Recent Labs    12/27/20 0303 12/28/20 0310  NA 134* 136  K 3.3* 4.3  CL 102 100  CO2 24 26  GLUCOSE 273* 125*  BUN 11 12  CREATININE 1.23* 1.39*  CALCIUM 7.1* 6.7*   Recent Labs    12/26/20 2147  INR 1.0   No results for input(s): LABURIN in the last 72 hours. Results for orders placed or performed during the hospital encounter of 12/26/20  MRSA PCR Screening     Status: None   Collection Time: 12/26/20  8:00 PM  Result Value Ref Range Status   MRSA by PCR NEGATIVE NEGATIVE Final    Comment:        The GeneXpert MRSA Assay (FDA approved for NASAL specimens only), is one component of a comprehensive MRSA colonization surveillance program. It is not intended to diagnose MRSA infection nor to  guide or monitor treatment for MRSA infections. Performed at [facility], [place] [address], [place], [place] [address]     Studies/Results: DG CHEST PORT 1 VIEW  Result Date: 12/28/2020 CLINICAL DATA:  75 year old female with respiratory failure. EXAM: PORTABLE CHEST 1 VIEW COMPARISON:  Portable chest 12/27/2020 and earlier. FINDINGS: Portable AP semi upright view at 0443 hours. Extubated. Stable low lung volumes. Patchy bilateral perihilar opacity is stable. No pneumothorax or pulmonary edema. Probable small pleural effusions. Normal cardiac size and mediastinal contours allowing for portable technique. Chronic cervical ACDF. Paucity of bowel gas in the upper abdomen. IMPRESSION: Extubated with low lung volumes and stable patchy perihilar opacity. Probable small pleural effusions. Electronically Signed   By: Genevie Ann M.D.   On: 12/28/2020 06:08   DG CHEST PORT 1 VIEW  Result Date: 12/27/2020 CLINICAL DATA:  Hypoxia EXAM: PORTABLE CHEST 1 VIEW COMPARISON:  December 26, 2020 FINDINGS: Endotracheal tube tip is 3.5 cm above the carina. No pneumothorax. There are small pleural effusions bilaterally with bibasilar atelectasis. Lungs elsewhere are clear. Heart is upper normal in size with pulmonary vascularity normal. No adenopathy. No bone lesions. IMPRESSION: Endotracheal tube as described without evident pneumothorax. Small pleural effusions bilaterally with bibasilar atelectasis. Lungs elsewhere clear. Cardiac silhouette within normal limits. Electronically Signed   By: Lowella Grip III M.D.   On: 12/27/2020 07:55   DG CHEST PORT 1 VIEW  Result Date: 12/26/2020  CLINICAL DATA:  75 year old female status post intubation. EXAM: PORTABLE CHEST 1 VIEW COMPARISON:  Chest radiograph dated 08/09/2018. FINDINGS: Endotracheal tube with tip approximately 4.5 cm above the carina. Bilateral interstitial prominence and bibasilar densities noted which may represent atelectasis or chronic changes  versus less likely mild edema. Pneumonia is not excluded. Clinical correlation is recommended. No pneumothorax. The cardiac silhouette is within normal limits. No acute osseous pathology. Partially visualized cervical ACDF. IMPRESSION: 1. Endotracheal tube above the carina. 2. Bilateral interstitial densities may represent atelectasis, edema, or pneumonia. Electronically Signed   By: Anner Crete M.D.   On: 12/26/2020 19:06   VAS [place] LOWER EXTREMITY VENOUS (DVT)  Result Date: 12/26/2020  Lower Venous DVT Study Patient Name:  [name] [facility]  Date of Exam:   12/26/2020 Medical Rec #: [id number]           Accession #:    [id number] Date of Birth: July 02, 1946           Patient Gender: F Patient Age:   79Y Exam Location:  [facility] Procedure:      VAS [place] LOWER EXTREMITY VENOUS (DVT) Referring Phys: [id number] [place] --------------------------------------------------------------------------------  Indications: Swelling bilaterally in patient with pelvic mass, LT>RT.  Limitations: Poor ultrasound/tissue interface secondary to edema. Comparison       07-23-2018 Bilateral lower extremity venous was negative for Study:           DVT. Performing Technologist: Darlin Coco RDMS,RVT  Examination Guidelines: A complete evaluation includes B-mode imaging, spectral Doppler, color Doppler, and power Doppler as needed of all accessible portions of each vessel. Bilateral testing is considered an integral part of a complete examination. Limited examinations for reoccurring indications may be performed as noted. The reflux portion of the exam is performed with the patient in reverse Trendelenburg.  +---------+---------------+---------+-----------+----------+-------------------+ RIGHT    CompressibilityPhasicitySpontaneityPropertiesThrombus Aging      +---------+---------------+---------+-----------+----------+-------------------+ CFV      Full           Yes      Yes                                       +---------+---------------+---------+-----------+----------+-------------------+ SFJ      Full                                                             +---------+---------------+---------+-----------+----------+-------------------+ FV Prox  Full                                                             +---------+---------------+---------+-----------+----------+-------------------+ FV Mid   Full                                                             +---------+---------------+---------+-----------+----------+-------------------+ FV DistalFull                                                             +---------+---------------+---------+-----------+----------+-------------------+  PFV      Full                                         Not well visualized +---------+---------------+---------+-----------+----------+-------------------+ POP      Full                                                             +---------+---------------+---------+-----------+----------+-------------------+ PTV      Full                                         Not well visualized +---------+---------------+---------+-----------+----------+-------------------+ PERO     Full                                         Not well visualized +---------+---------------+---------+-----------+----------+-------------------+ Gastroc  Full                                                             +---------+---------------+---------+-----------+----------+-------------------+   +---------+---------------+---------+-----------+----------+-------------------+ LEFT     CompressibilityPhasicitySpontaneityPropertiesThrombus Aging      +---------+---------------+---------+-----------+----------+-------------------+ CFV      Full           Yes      Yes                                       +---------+---------------+---------+-----------+----------+-------------------+ SFJ      Full                                                             +---------+---------------+---------+-----------+----------+-------------------+ FV Prox  Full                                                             +---------+---------------+---------+-----------+----------+-------------------+ FV Mid   Full                                                             +---------+---------------+---------+-----------+----------+-------------------+ FV DistalFull                                                             +---------+---------------+---------+-----------+----------+-------------------+  PFV      Full                                                             +---------+---------------+---------+-----------+----------+-------------------+ POP      Full           Yes      Yes                                      +---------+---------------+---------+-----------+----------+-------------------+ PTV      Full                                         Not well visualized +---------+---------------+---------+-----------+----------+-------------------+ PERO     Full                                         Not well visualized +---------+---------------+---------+-----------+----------+-------------------+     Summary: RIGHT: - There is no evidence of deep vein thrombosis in the lower extremity. However, portions of this examination were limited- see technologist comments above.  - No cystic structure found in the popliteal fossa.  LEFT: - There is no evidence of deep vein thrombosis in the lower extremity. However, portions of this examination were limited- see technologist comments above.  - No cystic structure found in the popliteal fossa.  *See table(s) above for measurements and observations. Electronically signed by Harold Barban MD on 12/26/2020 at 7:50:15 PM.     Final     Assessment/Plan: 2 Days Post-Op, Procedure(s) (LRB): XI ROBOT ASSITED LAPAROSCOPIC  LEFT NEPHROURETERECTOMY/ STENT PLACEMENT/ TRANSURETHRAL RESECTION OF LEFT URETERAL ORIFICE;EXCISION OF SPLENULE (Left)  Improving; path pending  Respiratory distress--per IM.  On Cefepime, lasix, and O2 via [place], IS  AKI--stable; Cr slightly up today.  Expected rise s/p nephroureterectomy. Continue to monitor  Anemia--post op; monitor  DVT prophy  Oral pain meds  Continue clears until some return of bowel function  JP Cr; if consistent with serum d/c drain   Continue foley until more mobile  Transfer to floor  Care management consult for possible SNF due to need for rehab per PT  Ambulate with PT    LOS: 2 days   Debbrah Alar 12/28/2020, 10:40 AM

## 2020-12-28 NOTE — Progress Notes (Signed)
  Speech Language Pathology Treatment: Dysphagia  Patient Details Name: [name] MRN: [id number] DOB: October 25, 1945 Today's Date: 12/28/2020 Time: 8315-1761 SLP Time Calculation (min) (ACUTE ONLY): 15 min  Assessment / Plan / Recommendation Clinical Impression  Pt has consumed solids for breakfast and dinner - denies having severe difficulties.  States her swallow function is a 9/10 - 10 being normal - and her voice is a 7/10.  Pt had consumed 1/2 of her meal tray when SLP arrived to room.     Attempted 3 ounce [place] which pt did not conduct due to requiring frequent rest breaks.  She reports she takes small bites/sips at baseline. =   Observed slow intake with liquids only.  Cough x1 after intake of 3 ounces water and small sip of soda.    Given pt does not have her upper partial and she admits to increased coughing with intake during this hospital coarse= recommend continue dys3/thin diet.  Educated pt to precautions using teach back.      Will sign off at this time as pt's acute dysphagia is resolving.    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 Intraoperative state complicated by grade 1 splenic laceration. Patient extubated, however, in PACU developed respiratory distress with oxygen saturation 53% requiring re-intubation. PMH significant for DM, HTN, RA, HLD, depression, and anxiety.  Pt has h/o cervical spine fusion C4-C7 and partial thyroidectomy - she denies dysphagia associated with these procedures.  Pt does admit to dysphagia to pills and some solid foods "for years".  Currently partial is not present as pt reports the group home staff *where she resides* advised her to leave it at home.  Pt reports she has informed md in the past to her dysphagia but she has not had this evaluated. She denies requiring heimlich, having weight loss nor recurrent pnas.       SLP Plan  All goals met       Recommendations  Diet recommendations: Dysphagia 3 (mechanical soft);Thin liquid Liquids provided via: Cup;Straw Medication Administration: Whole meds with liquid Supervision: Patient able to self feed Compensations: Small sips/bites;Slow rate Postural Changes and/or Swallow Maneuvers: Upright 30-60 min after meal                Oral Care Recommendations: Oral care BID SLP Visit Diagnosis: Dysphagia, unspecified (R13.10) Plan: All goals met       GO                Macario Golds 12/28/2020, 6:36 PM

## 2020-12-28 NOTE — Consult Note (Signed)
NAME:  [name], MRN:  [id number], DOB:  Jan 06, 1946, LOS: 2 ADMISSION DATE:  12/26/2020, CONSULTATION DATE:  12/26/2020 REFERRING MD:  Dr. Lovena Neighbours, CHIEF COMPLAINT:  Left renal pelvic mass s/p left ureteral stent placement and TUR of left ureteral orifice   BRIEF  75 year old female presents to hospital 6/15 for scheduled left ureteral stent placement and TUR with known large left tumor within the collecting system. 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 Underwent diagnostic ureteroscopy 3/23 with biopsy pathology inconclusive. Intraoperative state complicated by grade 1 splenic laceration. Patient extubated. However, in PACU developed respiratory distress with oxygen saturation 53% requiring re-intubation. Critical Care Consulted. Patient's sister was at the bedside briefly and confirmed her full code status.   Cased discussed with Dr. Nyoka Cowden of anesthesia and she was given naloxone without improvement of her mental status or respiratory failure. There were no issues ventilating/oxygenating her during the surgery.   Unable to obtain further history as patient is intubated and sedated.  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   has a past surgical history that includes Cholecystectomy; Cervical fusion (2004); Thyroidectomy, partial; Abdominal hysterectomy; Back surgery; Pars plana repair of retinal deatachment; Eye surgery (Left); and Robot assited laparoscopic nephroureterectomy (Left, 12/26/2020).   Significant Hospital Events: Including procedures, antibiotic start and stop dates  in addition to other pertinent events   6/15 presents to OR. Re-intubated for respiratory distress 6/16 - 6/16 - doiung sbt . Demanding extubation. Meets extubation criteria. Not on pressors. Duplex LE - no DVT  Interim History / Subjective:   - 6/17 - remains extubated. On 1L [place] and 95%.  Not on pressors.  Has not eaten but is hungry and wants to eat.  Urologist feels patient can go to the floor from his perspective.  Creatinine is somewhat worse 1.39 mg percent.  Thrombocytopenia is better  Objective   Blood pressure (!) 122/48, pulse 80, temperature 97.8 F (36.6 C), temperature source Oral, resp. rate (!) 22, height 5\' 7"  (1.702 m), weight 80 kg, SpO2 94 %.        Intake/Output Summary (Last 24 hours) at 12/28/2020 0907 Last data filed at 12/28/2020 0600 Gross per 24 hour  Intake 2143.32 ml  Output 2490 ml  Net -346.68 ml    Examination: Pale looking obese female.  She looks comfortable and well and stable.  Pleasant.  Smiling and talking.  Vital signs appear stable.  1 L nasal cannula pulse ox 95%.  Moves all 4 extremities good strength.  Protecting airway.  Abdomen soft clear to auscultation bilaterally.  Has a ureteral drain.  Normal heart sounds.   Bedside echo performed, no signs of reduced RV or LV function. No pericardial effusion.   Labs/imaging that I have personally reviewed  (right click and "Reselect all SmartList Selections" daily)   LABS    PULMONARY Recent Labs  Lab 12/26/20 2044 12/27/20 1447  PHART 7.404 7.398  PCO2ART 37.0 37.6  PO2ART 114* 109*  HCO3 22.7 22.7  O2SAT 97.4 97.1    CBC Recent Labs  Lab 12/26/20 1839 12/27/20 0303 12/28/20 0310  HGB 11.7* 9.1* 8.6*  HCT 38.0 28.7* 27.6*  WBC 11.7* 9.6 9.8  PLT 159 105*  109*    COAGULATION Recent Labs  Lab 12/26/20 2147  INR 1.0    CARDIAC  No results for input(s): TROPONINI in the last 168 hours. No results for input(s): PROBNP in the last 168 hours.   CHEMISTRY Recent Labs   Lab 12/26/20 1839 12/26/20 2147 12/27/20 0303 12/28/20 0310  NA 139  --  134* 136  K 4.6  --  3.3* 4.3  CL 105  --  102 100  CO2 25  --  24 26  GLUCOSE 200*  --  273* 125*  BUN 10  --  11 12  CREATININE 1.26*  --  1.23* 1.39*  CALCIUM 8.1*  --  7.1* 6.7*  MG  --  1.9 1.7 2.1  PHOS  --  3.2 1.8* 4.1   Estimated Creatinine Clearance: 38.1 mL/min (A) (by C-G formula based on SCr of 1.39 mg/dL (H)).   LIVER Recent Labs  Lab 12/26/20 1839 12/26/20 2147 12/28/20 0310  AST 36  --  35  ALT 28  --  17  ALKPHOS 96  --  62  BILITOT 0.6  --  0.3  PROT 6.7  --  5.4*  ALBUMIN 3.4*  --  3.0*  INR  --  1.0  --      INFECTIOUS Recent Labs  Lab 12/26/20 2147 12/27/20 0303 12/28/20 0310  LATICACIDVEN 2.6*  --  1.7  PROCALCITON  --  0.32 0.44     ENDOCRINE CBG (last 3)  Recent Labs    12/27/20 2351 12/28/20 0414 12/28/20 0818  GLUCAP 137* 107* 58         IMAGING x48h  - image(s) personally visualized  -   highlighted in bold DG CHEST PORT 1 VIEW  Result Date: 12/28/2020 CLINICAL DATA:  75 year old female with respiratory failure. EXAM: PORTABLE CHEST 1 VIEW COMPARISON:  Portable chest 12/27/2020 and earlier. FINDINGS: Portable AP semi upright view at 0443 hours. Extubated. Stable low lung volumes. Patchy bilateral perihilar opacity is stable. No pneumothorax or pulmonary edema. Probable small pleural effusions. Normal cardiac size and mediastinal contours allowing for portable technique. Chronic cervical ACDF. Paucity of bowel gas in the upper abdomen. IMPRESSION: Extubated with low lung volumes and stable patchy perihilar opacity. Probable small pleural effusions. Electronically Signed   By: Genevie Ann M.D.   On: 12/28/2020 06:08   DG CHEST PORT 1 VIEW  Result Date: 12/27/2020 CLINICAL DATA:  Hypoxia EXAM: PORTABLE CHEST 1 VIEW COMPARISON:  December 26, 2020 FINDINGS: Endotracheal tube tip is 3.5 cm above the carina. No pneumothorax. There are small pleural effusions  bilaterally with bibasilar atelectasis. Lungs elsewhere are clear. Heart is upper normal in size with pulmonary vascularity normal. No adenopathy. No bone lesions. IMPRESSION: Endotracheal tube as described without evident pneumothorax. Small pleural effusions bilaterally with bibasilar atelectasis. Lungs elsewhere clear. Cardiac silhouette within normal limits. Electronically Signed   By: Lowella Grip III M.D.   On: 12/27/2020 07:55   DG CHEST PORT 1 VIEW  Result Date: 12/26/2020 CLINICAL DATA:  75 year old female status post intubation. EXAM: PORTABLE CHEST 1 VIEW COMPARISON:  Chest radiograph dated 08/09/2018. FINDINGS: Endotracheal tube with tip approximately 4.5 cm above the carina. Bilateral interstitial prominence and bibasilar densities noted which may represent atelectasis or chronic changes versus less likely mild edema. Pneumonia is not excluded. Clinical correlation is recommended. No pneumothorax. The cardiac silhouette is within normal limits. No acute osseous pathology. Partially visualized cervical ACDF. IMPRESSION: 1. Endotracheal tube above the carina. 2. Bilateral interstitial  densities may represent atelectasis, edema, or pneumonia. Electronically Signed   By: Anner Crete M.D.   On: 12/26/2020 19:06   VAS [place] LOWER EXTREMITY VENOUS (DVT)  Result Date: 12/26/2020  Lower Venous DVT Study Patient Name:  NAKEIA CALVI [facility]  Date of Exam:   12/26/2020 Medical Rec #: [id number]           Accession #:    [id number] Date of Birth: Nov 24, 1945           Patient Gender: F Patient Age:   53Y Exam Location:  [facility] Procedure:      VAS [place] LOWER EXTREMITY VENOUS (DVT) Referring Phys: [id number] [place] --------------------------------------------------------------------------------  Indications: Swelling bilaterally in patient with pelvic mass, LT>RT.  Limitations: Poor ultrasound/tissue interface secondary to edema. Comparison       07-23-2018 Bilateral lower  extremity venous was negative for Study:           DVT. Performing Technologist: Darlin Coco RDMS,RVT  Examination Guidelines: A complete evaluation includes B-mode imaging, spectral Doppler, color Doppler, and power Doppler as needed of all accessible portions of each vessel. Bilateral testing is considered an integral part of a complete examination. Limited examinations for reoccurring indications may be performed as noted. The reflux portion of the exam is performed with the patient in reverse Trendelenburg.  +---------+---------------+---------+-----------+----------+-------------------+ RIGHT    CompressibilityPhasicitySpontaneityPropertiesThrombus Aging      +---------+---------------+---------+-----------+----------+-------------------+ CFV      Full           Yes      Yes                                      +---------+---------------+---------+-----------+----------+-------------------+ SFJ      Full                                                             +---------+---------------+---------+-----------+----------+-------------------+ FV Prox  Full                                                             +---------+---------------+---------+-----------+----------+-------------------+ FV Mid   Full                                                             +---------+---------------+---------+-----------+----------+-------------------+ FV DistalFull                                                             +---------+---------------+---------+-----------+----------+-------------------+ PFV      Full  Not well visualized +---------+---------------+---------+-----------+----------+-------------------+ POP      Full                                                             +---------+---------------+---------+-----------+----------+-------------------+ PTV      Full                                          Not well visualized +---------+---------------+---------+-----------+----------+-------------------+ PERO     Full                                         Not well visualized +---------+---------------+---------+-----------+----------+-------------------+ Gastroc  Full                                                             +---------+---------------+---------+-----------+----------+-------------------+   +---------+---------------+---------+-----------+----------+-------------------+ LEFT     CompressibilityPhasicitySpontaneityPropertiesThrombus Aging      +---------+---------------+---------+-----------+----------+-------------------+ CFV      Full           Yes      Yes                                      +---------+---------------+---------+-----------+----------+-------------------+ SFJ      Full                                                             +---------+---------------+---------+-----------+----------+-------------------+ FV Prox  Full                                                             +---------+---------------+---------+-----------+----------+-------------------+ FV Mid   Full                                                             +---------+---------------+---------+-----------+----------+-------------------+ FV DistalFull                                                             +---------+---------------+---------+-----------+----------+-------------------+ PFV      Full                                                             +---------+---------------+---------+-----------+----------+-------------------+  POP      Full           Yes      Yes                                      +---------+---------------+---------+-----------+----------+-------------------+ PTV      Full                                         Not well visualized  +---------+---------------+---------+-----------+----------+-------------------+ PERO     Full                                         Not well visualized +---------+---------------+---------+-----------+----------+-------------------+     Summary: RIGHT: - There is no evidence of deep vein thrombosis in the lower extremity. However, portions of this examination were limited- see technologist comments above.  - No cystic structure found in the popliteal fossa.  LEFT: - There is no evidence of deep vein thrombosis in the lower extremity. However, portions of this examination were limited- see technologist comments above.  - No cystic structure found in the popliteal fossa.  *See table(s) above for measurements and observations. Electronically signed by Harold Barban MD on 12/26/2020 at 7:50:15 PM.    Final      Resolved Hospital Problem list   Lactic acidosis  Assessment & Plan:  Hx of Sleep Apnea - baesline present on admit Acute Hypoxemic Respiratory Failure - post op present on admit - In post-op setting with history of obstructive sleep apnea, now with bilateral interstitial infiltrates. Differential includes pulmonary edema vs pneumonia (aspiration vs community acquired)  12/27/2020: S/p extubation and doing well on 12/28/2020 on 1 L nasal cannula.  Chest x-ray with bilateral atelectasis/effusions  plan Pulse ox goal greater than 88% and titrate oxygen accordingly Lasix x 1 on 12/27/2020 and repeat again Lasix 12/28/2020 Get echo rule out acute diast chf    Concern for aspiration pneumonia  12/28/2020 - afebrile but PCT somewhat high  Plan  - continue cefepime for now (max 7d total abx but threshold to dc early )   Acute Kidney Injury - In post-op setting of left nephro-uretectomy vs prerenal etiology - present on admit  12/28/2020 - improved 6/16  (S/p albumin yesterday 6/15) but wworse again 6/17  plan - - monitor renal function - lasix and reassess 6/18  - urology  mnaging  Electrolyte imbalance  Hypokalemia - new 6/16 and resolved Hypomagnesemia - present on admit 12/26/20 and worse 6/16 and Rx Hypophosphatemia - new 6/16 and RX  Plan  - monitor -    Urothelial cell carcinoma s/p left nephro-ureterectomy 615?[id number] - present on admit - rology Dr Lovena Neighbours  12/28/2020= bx results pending. No post op pain  Plan  - dc PRN opioids - await bx of Left kidny, ureter and splenule from 01/01/21 - JP drain per urology  Diabetes Mellitus Type II - present on admit - SSI  Hypertension - baseline present on admit  12/28/2020- BP ok  plan - cintinue metoprolol for now   Rheumatoid Arthritis and immunusuppsrssed - presnt on admit - continue 5mg  prednisone, hydroxychloroquine and leflunamide  Hyperlipidemia - presnt on admit - continue atorvastatin  Depression - present on  admit - continue home olanzipine and escitalopram   Miscellanoues  - continue cogenitn and requip home meds  Best practice (right click and "Reselect all SmartList Selections" daily)  Diet:  d3 diet per  urolgy Pain/Anxiety/Delirium protocol (if indicated): Yes (RASS goal 0) VAP protocol (if indicated): Yes DVT prophylaxis: SCD GI prophylaxis: PPI Glucose control:  SSI Yes Central venous access:  N/A Arterial line:  N/A Foley:  per urology Mobility:  bed rest  PT consulted: N/A Last date of multidisciplinary goals of care discussion [full code per her sister, 12/26/20] Code Status:  full code Disposition: move to med surg. Urology primary., TRid Dr Cyndia Skeeters will be med consult - signed out      ATTESTATION & SIGNATURE    Dr. Brand Males, M.D., [facility]C.P Pulmonary and Critical Care Medicine Staff Physician [place] [facility] Pager: [phone], If no answer or between  15:00h - 7:00h: call [phone]  12/28/2020 9:07 AM

## 2020-12-28 NOTE — Evaluation (Signed)
Occupational Therapy Evaluation Patient Details Name: [name] MRN: [id number] DOB: 1945-12-04 Today's Date: 12/28/2020    History of Present Illness 75 year old female presents to hospital 6/15 for scheduled left ureteral stent placement and TUR with known large left tumor within the collecting system. Intraoperative state complicated by grade 1 splenic laceration. Patient extubated, however, in PACU developed respiratory distress with oxygen saturation 53% requiring re-intubation. PMH significant for DM, HTn, RA, HLD, depression, anxiety.   Clinical Impression   Patient being transferred to 4th floor therefore ended session with patient back to bed. Typically patient is independent with ambulation and self care at group home. Currently patient limited by abdominal pain impacting overall activity tolerance, balance, safety awareness. Patient anxious stating "I'm going to fall" in standing. Needs mod x2 to block knees and stabilize to take few steps towards head of bed. Patient also leaning toward L in sitting during grooming/hygiene tasks needs cues to correct posture. Recommend continued acute OT to maximize patient balance, activity tolerance, safety in order to facilitate D/C to venue listed below.    Follow Up Recommendations  SNF    Equipment Recommendations  Other (comment) (defer to next venue)       Precautions / Restrictions Precautions Precautions: Fall Restrictions Weight Bearing Restrictions: No      Mobility Bed Mobility Overal bed mobility: Needs Assistance Bed Mobility: Supine to Sit;Sit to Supine     Supine to sit: Min assist;HOB elevated Sit to supine: Mod assist;+2 for physical assistance   General bed mobility comments: mod A to lift legs and guide trunk back to bed    Transfers Overall transfer level: Needs assistance Equipment used: 2 person hand held assist Transfers: Sit to/from Stand Sit to Stand: Mod assist;+2 physical assistance;+2  safety/equipment         General transfer comment: patient anxious "I'm going to fall" with knees buckling needing mod x2 to steady and guard knees. able to take few small steps then assist with eccentric control back onto bed    Balance Overall balance assessment: Needs assistance Sitting-balance support: Feet supported Sitting balance-Leahy Scale: Poor Sitting balance - Comments: reliant on UEs   Standing balance support: Bilateral upper extremity supported Standing balance-Leahy Scale: Zero Standing balance comment: mod x2                           ADL either performed or assessed with clinical judgement   ADL Overall ADL's : Needs assistance/impaired Eating/Feeding: Independent;Bed level   Grooming: Wash/dry face;Brushing hair;Min guard;Sitting Grooming Details (indicate cue type and reason): patient leaning over toward L side during seated ADLs, limited activity tolerance 2* pain Upper Body Bathing: Min guard;Sitting   Lower Body Bathing: Maximal assistance;Sitting/lateral leans;Sit to/from stand   Upper Body Dressing : Min guard;Sitting   Lower Body Dressing: Maximal assistance;Sitting/lateral leans;Sit to/from stand   Toilet Transfer: Moderate assistance;+2 for physical assistance;+2 for safety/equipment;Cueing for safety;Cueing for sequencing Toilet Transfer Details (indicate cue type and reason): sit to stand from edge of bed, blocking at knees as patient is tremulous and buckling, able to take few small side steps before mod x2 for eccentric control back onto bed Toileting- Clothing Manipulation and Hygiene: Total assistance       Functional mobility during ADLs: Moderate assistance;+2 for physical assistance;+2 for safety/equipment;Cueing for safety;Cueing for sequencing General ADL Comments: patient requiring increased assistance with self care tasks due to pain limiting activity tolerance, balance, safety  Pertinent Vitals/Pain Pain Assessment:  Faces Faces Pain Scale: Hurts whole lot Pain Location: abdomen Pain Descriptors / Indicators: Moaning;Guarding;Grimacing Pain Intervention(s): Monitored during session     Hand Dominance Right   Extremity/Trunk Assessment Upper Extremity Assessment Upper Extremity Assessment: Generalized weakness   Lower Extremity Assessment Lower Extremity Assessment: Defer to PT evaluation       Communication Communication Communication: No difficulties   Cognition Arousal/Alertness: Awake/alert Behavior During Therapy: Anxious Overall Cognitive Status: Within Functional Limits for tasks assessed                                 General Comments: patient stating "I'm going to fall" appearing anxious with mobility   General Comments  desat to 86% on RA            Home Living Family/patient expects to be discharged to:: Private residence Living Arrangements: Group Home   Type of Home: House Home Access: Stairs to enter Technical brewer of Steps: 5 Entrance Stairs-Rails: Right;Left Home Layout: One level     Bathroom Shower/Tub: Teacher, early years/pre: Standard Bathroom Accessibility: Yes   Home Equipment: Grab bars - tub/shower;Cane - single point   Additional Comments: L&H group home      Prior Functioning/Environment Level of Independence: Independent with assistive device(s)                 OT Problem List: Decreased strength;Decreased activity tolerance;Impaired balance (sitting and/or standing);Decreased safety awareness;Pain;Obesity      OT Treatment/Interventions: Self-care/ADL training;Therapeutic activities;Therapeutic exercise;DME and/or AE instruction;Patient/family education;Balance training    OT Goals(Current goals can be found in the care plan section) Acute Rehab OT Goals Patient Stated Goal: get back to walking independently OT Goal Formulation: With patient Time For Goal Achievement: 01/11/21 Potential to Achieve  Goals: Good  OT Frequency: Min 2X/week           Co-evaluation PT/OT/SLP Co-Evaluation/Treatment: Yes Reason for Co-Treatment: To address functional/ADL transfers;For patient/therapist safety PT goals addressed during session: Mobility/safety with mobility OT goals addressed during session: ADL's and self-care      AM-PAC OT "6 Clicks" Daily Activity     Outcome Measure Help from another person eating meals?: None Help from another person taking care of personal grooming?: A Little Help from another person toileting, which includes using toliet, bedpan, or urinal?: A Lot Help from another person bathing (including washing, rinsing, drying)?: A Lot Help from another person to put on and taking off regular upper body clothing?: A Little Help from another person to put on and taking off regular lower body clothing?: A Lot 6 Click Score: 16   End of Session Equipment Utilized During Treatment: Oxygen Nurse Communication: Mobility status  Activity Tolerance: Patient limited by pain;Patient limited by fatigue Patient left: in bed;with call bell/phone within reach  OT Visit Diagnosis: Unsteadiness on feet (R26.81);Other abnormalities of gait and mobility (R26.89);Muscle weakness (generalized) (M62.81);Pain Pain - part of body:  (abdomen)                Time: 1030-1045 OT Time Calculation (min): 15 min Charges:  OT General Charges $OT Visit: 1 Visit OT Evaluation $OT Eval Low Complexity: [place] OT OT pager: [phone]  Rosemary Holms 12/28/2020, 11:23 AM

## 2020-12-28 NOTE — Progress Notes (Signed)
  Echocardiogram 2D Echocardiogram has been performed.  Merrie Roof F 12/28/2020, 5:11 PM

## 2020-12-28 NOTE — Progress Notes (Signed)
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 Intraoperative state complicated by grade 1 splenic laceration. Patient extubated, however, in PACU developed respiratory distress with oxygen saturation 53% requiring re-intubation. Extubated 12/27/20. PMH significant for DM, HTn, RA, HLD, depression, anxiety.    PT Comments    The patient becomes anxious when mobilizing., expressing fear of falling.  Requires 2 persons to stand  with arm hold assist with Knees buckling. Did take small side steps. Patient  returned to bed as to transfer to another unit.  Continue PT for progressive mobility.  Follow Up Recommendations  SNF     Equipment Recommendations  Rolling walker with 5" wheels    Recommendations for Other Services       Precautions / Restrictions Precautions Precautions: Fall Precaution Comments: L side JP drain Restrictions Weight Bearing Restrictions: No    Mobility  Bed Mobility Overal bed mobility: Needs Assistance Bed Mobility: Supine to Sit;Sit to Supine     Supine to sit: Min assist;HOB elevated Sit to supine: Mod assist;+2 for physical assistance   General bed mobility comments: mod A to lift legs and guide trunk back to bed    Transfers Overall transfer level: Needs assistance Equipment used: 2 person hand held assist Transfers: Sit to/from Stand Sit to Stand: Mod assist;+2 physical assistance;+2 safety/equipment         General transfer comment: patient anxious "I'm going to fall" with knees buckling needing mod x2 to steady and guard knees. able to take few small steps then assist with eccentric control back onto bed  Ambulation/Gait                 Stairs              Wheelchair Mobility    Modified Rankin (Stroke Patients Only)       Balance Overall balance assessment: Needs assistance Sitting-balance support: Feet supported Sitting balance-Leahy Scale: Fair Sitting balance - Comments: reliant on UEs   Standing balance support: Bilateral upper extremity supported Standing balance-Leahy Scale: Zero Standing balance comment: stood and requires support, knees buckling                            Cognition Arousal/Alertness: Awake/alert Behavior During Therapy: Anxious Overall Cognitive Status: Within Functional Limits for tasks assessed                                 General Comments: patient stating "I'm going to fall" appearing anxious with mobility      Exercises      General Comments General comments (skin integrity, edema, etc.): desat to 86% on RA      Pertinent Vitals/Pain Pain Assessment: Faces Faces Pain Scale: Hurts whole lot Pain Location: abdomen Pain Descriptors / Indicators: Moaning;Guarding;Grimacing Pain Intervention(s): Limited activity within patient's tolerance;Monitored during session    Home Living Family/patient expects to be discharged to:: Private residence Living Arrangements: Group Home   Type of Home: House Home Access: Stairs to enter Entrance Stairs-Rails: Right;Left Home Layout: One level Home Equipment: Grab bars - tub/shower;Cane - single point Additional Comments: L&H group home    Prior Function Level of Independence: Independent  with assistive device(s)          PT Goals (current goals can now be found in the care plan section) Acute Rehab PT Goals Patient Stated Goal: get back to walking independently Progress towards PT goals: Progressing toward goals    Frequency    Min 2X/week      PT Plan Current plan remains appropriate;Frequency needs to be updated    Co-evaluation PT/OT/SLP Co-Evaluation/Treatment: Yes Reason for Co-Treatment: For  patient/therapist safety PT goals addressed during session: Mobility/safety with mobility OT goals addressed during session: ADL's and self-care      AM-PAC PT "6 Clicks" Mobility   Outcome Measure  Help needed turning from your back to your side while in a flat bed without using bedrails?: A Lot Help needed moving from lying on your back to sitting on the side of a flat bed without using bedrails?: A Lot Help needed moving to and from a bed to a chair (including a wheelchair)?: A Lot Help needed standing up from a chair using your arms (e.g., wheelchair or bedside chair)?: A Lot Help needed to walk in hospital room?: Total Help needed climbing 3-5 steps with a railing? : Total 6 Click Score: 10    End of Session   Activity Tolerance: Patient limited by pain (limited by anxiousness) Patient left: in bed;with call bell/phone within reach;with bed alarm set Nurse Communication: Mobility status PT Visit Diagnosis: Muscle weakness (generalized) (M62.81);Difficulty in walking, not elsewhere classified (R26.2);Unsteadiness on feet (R26.81);Other abnormalities of gait and mobility (R26.89)     Time: 5379-4327 PT Time Calculation (min) (ACUTE ONLY): 13 min  Charges:                       [name] PT Acute Rehabilitation Services Pager [phone] Office [phone]  [name] 12/28/2020, 2:24 PM

## 2020-12-29 DIAGNOSIS — Z9289 Personal history of other medical treatment: Secondary | ICD-10-CM

## 2020-12-29 DIAGNOSIS — J9621 Acute and chronic respiratory failure with hypoxia: Secondary | ICD-10-CM

## 2020-12-29 LAB — CBC
HCT: 29.9 % — ABNORMAL LOW (ref 36.0–46.0)
Hemoglobin: 9.2 g/dL — ABNORMAL LOW (ref 12.0–15.0)
MCH: 30.4 pg (ref 26.0–34.0)
MCHC: 30.8 g/dL (ref 30.0–36.0)
MCV: 98.7 fL (ref 80.0–100.0)
Platelets: 114 10*3/uL — ABNORMAL LOW (ref 150–400)
RBC: 3.03 MIL/uL — ABNORMAL LOW (ref 3.87–5.11)
RDW: 16.3 % — ABNORMAL HIGH (ref 11.5–15.5)
WBC: 6.6 10*3/uL (ref 4.0–10.5)
nRBC: 0 % (ref 0.0–0.2)

## 2020-12-29 LAB — BASIC METABOLIC PANEL
Anion gap: 8 (ref 5–15)
BUN: 18 mg/dL (ref 8–23)
CO2: 28 mmol/L (ref 22–32)
Calcium: 6.7 mg/dL — ABNORMAL LOW (ref 8.9–10.3)
Chloride: 104 mmol/L (ref 98–111)
Creatinine, Ser: 1.68 mg/dL — ABNORMAL HIGH (ref 0.44–1.00)
GFR, Estimated: 32 mL/min — ABNORMAL LOW (ref >60)
Glucose, Bld: 89 mg/dL (ref 70–99)
Potassium: 3.8 mmol/L (ref 3.5–5.1)
Sodium: 140 mmol/L (ref 135–145)

## 2020-12-29 LAB — GLUCOSE, CAPILLARY
Glucose-Capillary: 84 mg/dL (ref 70–99)
Glucose-Capillary: 97 mg/dL (ref 70–99)
Glucose-Capillary: 127 mg/dL — ABNORMAL HIGH (ref 70–99)
Glucose-Capillary: 113 mg/dL — ABNORMAL HIGH (ref 70–99)

## 2020-12-29 LAB — SURGICAL PATHOLOGY

## 2020-12-29 LAB — PROCALCITONIN: Procalcitonin: 0.37 ng/mL

## 2020-12-29 LAB — PHOSPHORUS: Phosphorus: 3.1 mg/dL (ref 2.5–4.6)

## 2020-12-29 MED ORDER — POLYETHYLENE GLYCOL 3350 17 G PO PACK
17.0000 g | PACK | Freq: Two times a day (BID) | ORAL | Status: DC
  Administered 2020-12-30 – 2021-01-01 (×2): 17 g via ORAL
  Filled 2020-12-29 (×6): qty 1

## 2020-12-29 MED ORDER — ACETAMINOPHEN 325 MG PO TABS
650.0000 mg | ORAL_TABLET | Freq: Four times a day (QID) | ORAL | Status: DC | PRN
  Administered 2020-12-29 – 2021-01-01 (×7): 650 mg via ORAL
  Filled 2020-12-29 (×7): qty 2

## 2020-12-29 NOTE — Progress Notes (Signed)
3 Days Post-Op Subjective: Patient reports pain control adequate.  Positive flatus today. JP fluid not sent for creatinine. Ambulating in halls with assistance  Objective: Vital signs in last 24 hours: Temp:  [98.4 F (36.9 C)-99.1 F (37.3 C)] 98.4 F (36.9 C) (06/18 1237) Pulse Rate:  [87-92] 87 (06/18 1237) Resp:  [18-22] 18 (06/18 1237) BP: (123-135)/(56-69) 135/59 (06/18 1237) SpO2:  [97 %-100 %] 100 % (06/18 1237) Weight:  [78.4 kg] 78.4 kg (06/18 0542)  Intake/Output from previous day: 06/17 0701 - 06/18 0700 In: 100 [IV Piggyback:100] Out: 4460 [Urine:4400; Drains:60] Intake/Output this shift: No intake/output data recorded.  Physical Exam:  General:alert, cooperative, and no distress Cardiovascular: RRR Lungs: CTA GI: soft, mildly tender, faint bowel sounds, no palpable masses Incisions: C/D/I Urine: clear Extremities: pitting edema bilateral with excoriations from mid shin extending distally to feet; no pustules or drainage  Lab Results: Recent Labs    12/27/20 0303 12/28/20 0310 12/29/20 0550  HGB 9.1* 8.6* 9.2*  HCT 28.7* 27.6* 29.9*   BMET Recent Labs    12/28/20 0310 12/29/20 0550  NA 136 140  K 4.3 3.8  CL 100 104  CO2 26 28  GLUCOSE 125* 89  BUN 12 18  CREATININE 1.39* 1.68*  CALCIUM 6.7* 6.7*   Recent Labs    12/26/20 2147  INR 1.0   No results for input(s): LABURIN in the last 72 hours. Results for orders placed or performed during the hospital encounter of 12/26/20  MRSA PCR Screening     Status: None   Collection Time: 12/26/20  8:00 PM  Result Value Ref Range Status   MRSA by PCR NEGATIVE NEGATIVE Final    Comment:        The GeneXpert MRSA Assay (FDA approved for NASAL specimens only), is one component of a comprehensive MRSA colonization surveillance program. It is not intended to diagnose MRSA infection nor to guide or monitor treatment for MRSA infections. Performed at [facility], [place] [address] [place], [place] [address]     Studies/Results: DG CHEST PORT 1 VIEW  Result Date: 12/28/2020 CLINICAL DATA:  75 year old female with respiratory failure. EXAM: PORTABLE CHEST 1 VIEW COMPARISON:  Portable chest 12/27/2020 and earlier. FINDINGS: Portable AP semi upright view at 0443 hours. Extubated. Stable low lung volumes. Patchy bilateral perihilar opacity is stable. No pneumothorax or pulmonary edema. Probable small pleural effusions. Normal cardiac size and mediastinal contours allowing for portable technique. Chronic cervical ACDF. Paucity of bowel gas in the upper abdomen. IMPRESSION: Extubated with low lung volumes and stable patchy perihilar opacity. Probable small pleural effusions. Electronically Signed   By: Genevie Ann M.D.   On: 12/28/2020 06:08   ECHOCARDIOGRAM COMPLETE  Result Date: 12/28/2020    ECHOCARDIOGRAM REPORT   Patient Name:   [name] [facility] Date of Exam: 12/28/2020 Medical Rec #:  [id number]          Height:       67.0 in Accession #:    [id number]         Weight:       176.4 lb Date of Birth:  25-Nov-1945          BSA:          1.917 m Patient Age:    75 years           BP:           114/54 mmHg Patient Gender: F  HR:           87 bpm. Exam Location:  Inpatient Procedure: 2D Echo, Cardiac Doppler and Color Doppler Indications:    [place] respiratory distress R06.03  History:        Patient has no prior history of Echocardiogram examinations.  Sonographer:    Merrie Roof RDCS Referring Phys: [place]  1. Left ventricular ejection fraction, by estimation, is 60 to 65%. The left ventricle has normal function. The left ventricle has no regional wall motion abnormalities. Left ventricular diastolic parameters were normal.  2. Right ventricular systolic function is normal. The right ventricular size is normal.  3. The mitral valve is normal in structure. Trivial mitral valve regurgitation.  4. The aortic valve is normal in structure. Aortic valve  regurgitation is mild. No aortic stenosis is present. FINDINGS  Left Ventricle: Left ventricular ejection fraction, by estimation, is 60 to 65%. The left ventricle has normal function. The left ventricle has no regional wall motion abnormalities. The left ventricular internal cavity size was normal in size. There is  no left ventricular hypertrophy. Left ventricular diastolic parameters were normal. Right Ventricle: The right ventricular size is normal. Right vetricular wall thickness was not well visualized. Right ventricular systolic function is normal. Left Atrium: Left atrial size was normal in size. Right Atrium: Right atrial size was normal in size. Pericardium: There is no evidence of pericardial effusion. Mitral Valve: The mitral valve is normal in structure. Trivial mitral valve regurgitation. Tricuspid Valve: The tricuspid valve is normal in structure. Tricuspid valve regurgitation is trivial. Aortic Valve: The aortic valve is normal in structure. Aortic valve regurgitation is mild. No aortic stenosis is present. Aortic valve mean gradient measures 3.5 mmHg. Aortic valve peak gradient measures 6.9 mmHg. Aortic valve area, by VTI measures 1.72 cm. Pulmonic Valve: The pulmonic valve was grossly normal. Pulmonic valve regurgitation is not visualized. Aorta: The aortic root and ascending aorta are structurally normal, with no evidence of dilitation. IAS/Shunts: The atrial septum is grossly normal.  LEFT VENTRICLE PLAX 2D LVIDd:         3.50 cm  Diastology LVIDs:         2.60 cm  LV e' medial:    7.51 cm/s LV PW:         1.00 cm  LV E/e' medial:  9.9 LV IVS:        0.90 cm  LV e' lateral:   6.74 cm/s LVOT diam:     1.70 cm  LV E/e' lateral: 11.0 LV SV:         40 LV SV Index:   21 LVOT Area:     2.27 cm  RIGHT VENTRICLE RV Basal diam:  3.20 cm LEFT ATRIUM             Index       RIGHT ATRIUM           Index LA diam:        3.30 cm 1.72 cm/m  RA Area:     10.60 cm LA Vol (A2C):   57.9 ml 30.20 ml/m RA  Volume:   19.10 ml  9.96 ml/m LA Vol (A4C):   50.5 ml 26.34 ml/m LA Biplane Vol: 55.5 ml 28.95 ml/m  AORTIC VALVE AV Area (Vmax):    1.78 cm AV Area (Vmean):   1.74 cm AV Area (VTI):     1.72 cm AV Vmax:  131.00 cm/s AV Vmean:          88.700 cm/s AV VTI:            0.230 m AV Peak Grad:      6.9 mmHg AV Mean Grad:      3.5 mmHg LVOT Vmax:         102.75 cm/s LVOT Vmean:        68.150 cm/s LVOT VTI:          0.175 m LVOT/AV VTI ratio: 0.76  AORTA Ao Root diam: 2.80 cm Ao Asc diam:  2.60 cm MITRAL VALVE MV Area (PHT): 5.23 cm    SHUNTS MV Decel Time: 145 msec    Systemic VTI:  0.18 m MV E velocity: 74.10 cm/s  Systemic Diam: 1.70 cm MV A velocity: 72.00 cm/s MV E/A ratio:  1.03 Mertie Moores MD Electronically signed by Mertie Moores MD Signature Date/Time: 12/28/2020/6:13:11 PM    Final     Assessment/Plan: 3 Days Post-Op, Procedure(s) (LRB): XI ROBOT ASSITED LAPAROSCOPIC  LEFT NEPHROURETERECTOMY/ STENT PLACEMENT/ TRANSURETHRAL RESECTION OF LEFT URETERAL ORIFICE;EXCISION OF SPLENULE (Left)  Continue ambulation on halls with assistance. Patient will need SNF placement Regular diet Continue to wean O2. Likely discharge monday    LOS: 3 days   Nicolette Bang 12/29/2020, 8:39 PM

## 2020-12-29 NOTE — TOC Progression Note (Signed)
Transition of Care [facility]) - Progression Note   Patient Details  Name: [name] MRN: [id number] Date of Birth: Feb 06, 1946  Transition of [facility]) CM/SW [place], LCSW Phone Number: 12/29/2020, 1:54 PM  Clinical Narrative: CSW faxed initial referral and PT notes to local SNFs. TOC awaiting bed offers.  Expected Discharge Plan: [place] Barriers to Discharge: Continued Medical Work up, SNF Pending bed offer, Ship broker  Expected Discharge Plan and Services Expected Discharge Plan: [place] Discharge Planning Services: CM Consult Post Acute Care Choice: [place] Living arrangements for the past 2 months: Single Family Home  Readmission Risk Interventions No flowsheet data found.

## 2020-12-29 NOTE — Progress Notes (Signed)
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   Intraoperative prostate complicated by grade 1 splenic laceration.  Patient was extubated.  However in PACU developed respiratory distress with oxygen saturation 53% requiring reintubation.  Critical care was consulted.  Patient was treated for pulmonary edema and questionable aspiration pneumonia.  Received IV Lasix and IV cefepime.  Assessment & Plan:   Active Problems:   Renal mass   1-Acute Hypoxic Respiratory Failure: History of sleep apnea In the postoperative setting with history of a sleep apnea, bilateral interstitial infiltrates. -Received IV Lasix.  On IV Cefepime.  -PNA vs Pulmonary edema.  -Continue with antibiotics, pro-calcitonin elevated.  -Hold on lasix due to increase in cr  -ECHO normal EF and diastolic dysfunction.   2-AKI: in the post operative setting of left Nephro-ureterectomy:  Hold IV lasix today.  Repeat labs tomorrow.    3-Urothelia Carcinoma S/P left Nephro-ureterectomy:  Per urology Awaiting Bx.   4-DM Type 2:  SSI.   5-HTN; Continue with metoprolol.   RA; Continue with prednisone, hydroxychloroquine, leflunamide.    HLD; On statins.   Depression:  On Onlazapine and escitalopram.   Hypokalemia, hypomagnesemia, hypophosphatemia;  Normalized.        Nutrition Problem: Inadequate oral intake Etiology: inability to eat    Signs/Symptoms: NPO status    Interventions: Refer to RD note for recommendations  Estimated body mass index is 27.07 kg/m as  calculated from the following:   Height as of this encounter: 5' 7" (1.702 m).   Weight as of this encounter: 78.4 kg.   DVT prophylaxis: SCD Code Status: Full code Family Communication: care discuss with patient Disposition Plan:  Status is: Inpatient  Remains inpatient appropriate because:IV treatments appropriate due to intensity of illness or inability to take PO  Dispo: The patient is from: Home              Anticipated d/c is to: SNF              Patient currently is not medically stable to d/c.   Difficult to place patient No        Consultants:  Urology primary   Procedures:  ECHO  Antimicrobials:    Subjective: She is  breathing better with oxygen. She complaints of pain.   Objective: Vitals:   12/28/20 1149 12/28/20 2050 12/29/20 0500 12/29/20 0542  BP: (!) 114/54 123/62  135/69  Pulse: 87 89  87  Resp: (!) 22 (!) 22    Temp: 98.6 F (37 C) 99.1 F (37.3 C)  98.9 F (37.2 C)  TempSrc: Oral Oral  Oral  SpO2: 96% 97%  100%  Weight:   78.4 kg 78.4 kg  Height:        Intake/Output Summary (Last 24 hours) at 12/29/2020 0743 Last data filed at 12/29/2020 0301 Gross per 24 hour  Intake 100 ml  Output 4460 ml  Net -4360 ml   Filed Weights   12/28/20 0418 12/29/20 0500 12/29/20 0542  Weight: 80 kg 78.4 kg 78.4 kg    Examination:  General exam: Appears calm and comfortable  Respiratory system: Clear to auscultation.  Respiratory effort normal. Cardiovascular system: S1 & S2 heard, RRR. No JVD, murmurs, rubs, gallops or clicks. No pedal edema. Gastrointestinal system: Abdomen is nondistended, soft and nontender. No organomegaly or masses felt. Normal bowel sounds heard. Central nervous system: Alert and oriented.  Extremities: Symmetric 5 x 5 power.   Data Reviewed: I have personally reviewed following labs and imaging studies  CBC: Recent Labs  Lab 12/26/20 1819 12/26/20 1839 12/27/20 0303 12/28/20 0310 12/29/20 0550  WBC  --  11.7* 9.6  9.8 6.6  NEUTROABS  --  10.1*  --   --   --   HGB 12.2 11.7* 9.1* 8.6* 9.2*  HCT 40.2 38.0 28.7* 27.6* 29.9*  MCV  --  100.3* 97.0 99.3 98.7  PLT  --  159 105* 109* 517*   Basic Metabolic Panel: Recent Labs  Lab 12/26/20 1839 12/26/20 2147 12/27/20 0303 12/28/20 0310 12/29/20 0550  NA 139  --  134* 136 140  K 4.6  --  3.3* 4.3 3.8  CL 105  --  102 100 104  CO2 25  --  _0 GLUCOSE 200*  --  273* 125* 89  BUN 10  --  _1 CREATININE 1.26*  --  1.23* 1.39* 1.68*  CALCIUM 8.1*  --  7.1* 6.7* 6.7*  MG  --  1.9 1.7 2.1  --   PHOS  --  3.2 1.8* 4.1 3.1   GFR: Estimated Creatinine Clearance: 31.2 mL/min (A) (by C-G formula based on SCr of 1.68 mg/dL (H)). Liver Function Tests: Recent Labs  Lab 12/26/20 1839 12/28/20 0310  AST 36 35  ALT 28 17  ALKPHOS 96 62  BILITOT 0.6 0.3  PROT 6.7 5.4*  ALBUMIN 3.4* 3.0*   No results for input(s): LIPASE, AMYLASE in the last 168 hours. No results for input(s): AMMONIA in the last 168 hours. Coagulation Profile: Recent Labs  Lab 12/26/20 2147  INR 1.0   Cardiac Enzymes: No results for input(s): CKTOTAL, CKMB, CKMBINDEX, TROPONINI in the last 168 hours. BNP (last 3 results) No results for input(s): PROBNP in the last 8760 hours. HbA1C: No results for input(s): HGBA1C in the last 72 hours. CBG: Recent Labs  Lab 12/28/20 0414 12/28/20 0818 12/28/20 1147 12/28/20 1648 12/28/20 2111  GLUCAP 107* 89 133* 128* 125*   Lipid Profile: Recent Labs    12/27/20 0303  TRIG 63   Thyroid Function Tests: Recent Labs    12/27/20 0303  TSH 0.148*   Anemia Panel: No results for input(s): VITAMINB12, FOLATE, FERRITIN, TIBC, IRON, RETICCTPCT in the last 72 hours. Sepsis Labs: Recent Labs  Lab 12/26/20 2147 12/27/20 0303 12/28/20 0310 12/29/20 0550  PROCALCITON  --  0.32 0.44 0.37  LATICACIDVEN 2.6*  --  1.7  --     Recent Results (from the past 240 hour(s))  SARS CORONAVIRUS 2 (TAT 6-24 HRS) Nasopharyngeal  Nasopharyngeal Swab     Status: None   Collection Time: 12/24/20  2:38 PM   Specimen: Nasopharyngeal Swab  Result Value Ref Range Status   SARS Coronavirus 2 NEGATIVE NEGATIVE Final    Comment: (NOTE) SARS-CoV-2 target nucleic acids are NOT DETECTED.  The SARS-CoV-2 RNA is generally detectable in upper and lower respiratory specimens during the acute phase of infection. Negative results do not preclude SARS-CoV-2 infection, do not rule out co-infections with other pathogens, and should not be used as the sole basis for treatment or other patient management decisions. Negative results must be combined  with clinical observations, patient history, and epidemiological information. The expected result is Negative.  Fact Sheet for Patients: SugarRoll.be  Fact Sheet for Healthcare Providers: https://www.woods-mathews.com/  This test is not yet approved or cleared by the [place] FDA and  has been authorized for detection and/or diagnosis of SARS-CoV-2 by FDA under an Emergency Use Authorization (EUA). This EUA will remain  in effect (meaning this test can be used) for the duration of the COVID-19 declaration under Se ction 564(b)(1) of the Act, 21 U.S.C. section 360bbb-3(b)(1), unless the authorization is terminated or revoked sooner.  Performed at [facility], [place] [address] [place], [place] [address]   MRSA PCR Screening     Status: None   Collection Time: 12/26/20  8:00 PM  Result Value Ref Range Status   MRSA by PCR NEGATIVE NEGATIVE Final    Comment:        The GeneXpert MRSA Assay (FDA approved for NASAL specimens only), is one component of a comprehensive MRSA colonization surveillance program. It is not intended to diagnose MRSA infection nor to guide or monitor treatment for MRSA infections. Performed at [facility], [place] [address] [place], [place] [address]          Radiology  Studies: DG CHEST PORT 1 VIEW  Result Date: 12/28/2020 CLINICAL DATA:  75 year old female with respiratory failure. EXAM: PORTABLE CHEST 1 VIEW COMPARISON:  Portable chest 12/27/2020 and earlier. FINDINGS: Portable AP semi upright view at 0443 hours. Extubated. Stable low lung volumes. Patchy bilateral perihilar opacity is stable. No pneumothorax or pulmonary edema. Probable small pleural effusions. Normal cardiac size and mediastinal contours allowing for portable technique. Chronic cervical ACDF. Paucity of bowel gas in the upper abdomen. IMPRESSION: Extubated with low lung volumes and stable patchy perihilar opacity. Probable small pleural effusions. Electronically Signed   By: Genevie Ann M.D.   On: 12/28/2020 06:08   ECHOCARDIOGRAM COMPLETE  Result Date: 12/28/2020    ECHOCARDIOGRAM REPORT   Patient Name:   ANALY BASSFORD [facility] Date of Exam: 12/28/2020 Medical Rec #:  [id number]          Height:       67.0 in Accession #:    [id number]         Weight:       176.4 lb Date of Birth:  [birth year]          BSA:          1.917 m Patient Age:    32 years           BP:           114/54 mmHg Patient Gender: F                  HR:           87 bpm. Exam Location:  Inpatient Procedure: 2D Echo, Cardiac Doppler and Color Doppler Indications:    [place] respiratory distress R06.03  History:        Patient has no prior history of Echocardiogram examinations.  Sonographer:    Merrie Roof RDCS Referring Phys: [place]  1. Left ventricular ejection fraction, by estimation, is 60 to 65%. The left ventricle has normal function. The left ventricle has no regional wall motion abnormalities. Left ventricular diastolic parameters were normal.  2. Right ventricular systolic function is normal. The right ventricular size is normal.  3. The mitral valve is normal in structure. Trivial mitral valve regurgitation.  4.  The aortic valve is normal in structure. Aortic valve regurgitation is mild. No aortic stenosis is  present. FINDINGS  Left Ventricle: Left ventricular ejection fraction, by estimation, is 60 to 65%. The left ventricle has normal function. The left ventricle has no regional wall motion abnormalities. The left ventricular internal cavity size was normal in size. There is  no left ventricular hypertrophy. Left ventricular diastolic parameters were normal. Right Ventricle: The right ventricular size is normal. Right vetricular wall thickness was not well visualized. Right ventricular systolic function is normal. Left Atrium: Left atrial size was normal in size. Right Atrium: Right atrial size was normal in size. Pericardium: There is no evidence of pericardial effusion. Mitral Valve: The mitral valve is normal in structure. Trivial mitral valve regurgitation. Tricuspid Valve: The tricuspid valve is normal in structure. Tricuspid valve regurgitation is trivial. Aortic Valve: The aortic valve is normal in structure. Aortic valve regurgitation is mild. No aortic stenosis is present. Aortic valve mean gradient measures 3.5 mmHg. Aortic valve peak gradient measures 6.9 mmHg. Aortic valve area, by VTI measures 1.72 cm. Pulmonic Valve: The pulmonic valve was grossly normal. Pulmonic valve regurgitation is not visualized. Aorta: The aortic root and ascending aorta are structurally normal, with no evidence of dilitation. IAS/Shunts: The atrial septum is grossly normal.  LEFT VENTRICLE PLAX 2D LVIDd:         3.50 cm  Diastology LVIDs:         2.60 cm  LV e' medial:    7.51 cm/s LV PW:         1.00 cm  LV E/e' medial:  9.9 LV IVS:        0.90 cm  LV e' lateral:   6.74 cm/s LVOT diam:     1.70 cm  LV E/e' lateral: 11.0 LV SV:         40 LV SV Index:   21 LVOT Area:     2.27 cm  RIGHT VENTRICLE RV Basal diam:  3.20 cm LEFT ATRIUM             Index       RIGHT ATRIUM           Index LA diam:        3.30 cm 1.72 cm/m  RA Area:     10.60 cm LA Vol (A2C):   57.9 ml 30.20 ml/m RA Volume:   19.10 ml  9.96 ml/m LA Vol (A4C):    50.5 ml 26.34 ml/m LA Biplane Vol: 55.5 ml 28.95 ml/m  AORTIC VALVE AV Area (Vmax):    1.78 cm AV Area (Vmean):   1.74 cm AV Area (VTI):     1.72 cm AV Vmax:           131.00 cm/s AV Vmean:          88.700 cm/s AV VTI:            0.230 m AV Peak Grad:      6.9 mmHg AV Mean Grad:      3.5 mmHg LVOT Vmax:         102.75 cm/s LVOT Vmean:        68.150 cm/s LVOT VTI:          0.175 m LVOT/AV VTI ratio: 0.76  AORTA Ao Root diam: 2.80 cm Ao Asc diam:  2.60 cm MITRAL VALVE MV Area (PHT): 5.23 cm    SHUNTS MV Decel Time: 145 msec    Systemic VTI:  0.18  m MV E velocity: 74.10 cm/s  Systemic Diam: 1.70 cm MV A velocity: 72.00 cm/s MV E/A ratio:  1.03 Mertie Moores MD Electronically signed by Mertie Moores MD Signature Date/Time: 12/28/2020/6:13:11 PM    Final         Scheduled Meds:  benztropine  0.5 mg Oral BID   chlorhexidine gluconate (MEDLINE KIT)  15 mL Mouth Rinse BID   Chlorhexidine Gluconate Cloth  6 each Topical Daily   docusate sodium  100 mg Oral BID   escitalopram  10 mg Oral Daily   hydroxychloroquine  400 mg Oral Daily   insulin aspart  0-5 Units Subcutaneous QHS   insulin aspart  0-9 Units Subcutaneous TID WC   metoprolol tartrate  12.5 mg Oral BID   OLANZapine  2.5 mg Oral Daily   pantoprazole (PROTONIX) IV  40 mg Intravenous QHS   polyethylene glycol  17 g Oral Daily   predniSONE  5 mg Per Tube Q breakfast   rOPINIRole  0.25 mg Oral QHS   Continuous Infusions:  ceFEPime (MAXIPIME) IV 2 g (12/28/20 2308)     LOS: 3 days    Time spent: 35 minutes.     Elmarie Shiley, MD [facility]   If 7PM-7AM, please contact night-coverage [email or web address]  12/29/2020, 7:43 AM

## 2020-12-29 NOTE — Progress Notes (Signed)
Pt refused CPAP again tonight. CPAP stand by.

## 2020-12-30 LAB — CBC
HCT: 29.4 % — ABNORMAL LOW (ref 36.0–46.0)
Hemoglobin: 9 g/dL — ABNORMAL LOW (ref 12.0–15.0)
MCH: 30.8 pg (ref 26.0–34.0)
MCHC: 30.6 g/dL (ref 30.0–36.0)
MCV: 100.7 fL — ABNORMAL HIGH (ref 80.0–100.0)
Platelets: 117 10*3/uL — ABNORMAL LOW (ref 150–400)
RBC: 2.92 MIL/uL — ABNORMAL LOW (ref 3.87–5.11)
RDW: 16 % — ABNORMAL HIGH (ref 11.5–15.5)
WBC: 7.4 10*3/uL (ref 4.0–10.5)
nRBC: 0 % (ref 0.0–0.2)

## 2020-12-30 LAB — GLUCOSE, CAPILLARY
Glucose-Capillary: 85 mg/dL (ref 70–99)
Glucose-Capillary: 158 mg/dL — ABNORMAL HIGH (ref 70–99)
Glucose-Capillary: 146 mg/dL — ABNORMAL HIGH (ref 70–99)
Glucose-Capillary: 118 mg/dL — ABNORMAL HIGH (ref 70–99)

## 2020-12-30 LAB — BASIC METABOLIC PANEL
Anion gap: 8 (ref 5–15)
BUN: 24 mg/dL — ABNORMAL HIGH (ref 8–23)
CO2: 25 mmol/L (ref 22–32)
Calcium: 7 mg/dL — ABNORMAL LOW (ref 8.9–10.3)
Chloride: 107 mmol/L (ref 98–111)
Creatinine, Ser: 1.29 mg/dL — ABNORMAL HIGH (ref 0.44–1.00)
GFR, Estimated: 43 mL/min — ABNORMAL LOW (ref >60)
Glucose, Bld: 81 mg/dL (ref 70–99)
Potassium: 3.8 mmol/L (ref 3.5–5.1)
Sodium: 140 mmol/L (ref 135–145)

## 2020-12-30 LAB — PHOSPHORUS: Phosphorus: 3.3 mg/dL (ref 2.5–4.6)

## 2020-12-30 MED ORDER — PANTOPRAZOLE SODIUM 40 MG PO TBEC
40.0000 mg | DELAYED_RELEASE_TABLET | Freq: Every day | ORAL | Status: DC
  Administered 2020-12-30 – 2020-12-31 (×2): 40 mg via ORAL
  Filled 2020-12-30 (×2): qty 1

## 2020-12-30 NOTE — Progress Notes (Signed)
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   Intraoperative prostate complicated by grade 1 splenic laceration.  Patient was extubated.  However in PACU developed respiratory distress with oxygen saturation 53% requiring reintubation.  Critical care was consulted.  Patient was treated for pulmonary edema and questionable aspiration pneumonia.  Received IV Lasix and IV cefepime.  Assessment & Plan:   Active Problems:   Renal mass   1-Acute Hypoxic Respiratory Failure: History of sleep apnea In the postoperative setting with history of a sleep apnea, bilateral interstitial infiltrates. -Received IV Lasix.  On IV Cefepime.  -PNA vs Pulmonary edema.  -Continue with antibiotics, pro-calcitonin elevated.  -Hold on lasix due to increase in cr  -ECHO normal EF and diastolic dysfunction.  -on 2 L oxygen.   2-AKI: in the post operative setting of left Nephro-ureterectomy:  Hold IV lasix today.  Cr down to 1.2 from 1.6    3-Urothelia Carcinoma S/P left Nephro-ureterectomy:  Per urology Awaiting Bx.   4-DM Type 2:  SSI.   5-HTN; Continue with metoprolol.   RA; Continue with prednisone, hydroxychloroquine, leflunamide.    HLD; On statins.   Depression:  On Onlazapine and escitalopram.   Hypokalemia, hypomagnesemia, hypophosphatemia;  Normalized.        Nutrition Problem: Inadequate oral intake Etiology: inability to eat    Signs/Symptoms: NPO status    Interventions: Refer to RD note for recommendations  Estimated body mass  index is 27.07 kg/m as calculated from the following:   Height as of this encounter: _0  (1.702 m).   Weight as of this encounter: 78.4 kg.   DVT prophylaxis: SCD Code Status: Full code Family Communication: care discuss with patient Disposition Plan:  Status is: Inpatient  Remains inpatient appropriate because:IV treatments appropriate due to intensity of illness or inability to take PO  Dispo: The patient is from: Home              Anticipated d/c is to: SNF              Patient currently is not medically stable to d/c.   Difficult to place patient No        Consultants:  Urology primary   Procedures:  ECHO  Antimicrobials:    Subjective: Alert, feeling ok, denies dyspnea.   Objective: Vitals:   12/29/20 0855 12/29/20 1237 12/29/20 2153 12/30/20 0416  BP: (!) 129/56 (!) 135/59 127/61 (!) 147/70  Pulse: 92 87 80 78  Resp:  _1 Temp:  98.4 F (36.9 C) 98.3 F (36.8 C) 98 F (36.7 C)  TempSrc:  Oral Oral Oral  SpO2:  100% 98% 99%  Weight:      Height:        Intake/Output Summary (Last 24 hours) at 12/30/2020 0723 Last data filed at 12/30/2020 0423 Gross per 24 hour  Intake 600 ml  Output 1225 ml  Net -625 ml    Filed Weights   12/28/20 0418 12/29/20 0500 12/29/20 0542  Weight: 80 kg 78.4 kg 78.4 kg    Examination:  General exam: NAD Respiratory system: CTA Cardiovascular system: S 1, S  2 RRR Gastrointestinal system: BS present, soft,  nt Central nervous system: alert Extremities: Symmetric power   Data Reviewed: I have personally reviewed following labs and imaging studies  CBC: Recent Labs  Lab 12/26/20 1819 12/26/20 1839 12/27/20 0303 12/28/20 0310 12/29/20 0550  WBC  --  11.7* 9.6 9.8 6.6  NEUTROABS  --  10.1*  --   --   --   HGB 12.2 11.7* 9.1* 8.6* 9.2*  HCT 40.2 38.0 28.7* 27.6* 29.9*  MCV  --  100.3* 97.0 99.3 98.7  PLT  --  159 105* 109* 114*    Basic Metabolic Panel: Recent Labs  Lab 12/26/20 1839  12/26/20 2147 12/27/20 0303 12/28/20 0310 12/29/20 0550 12/30/20 0550  NA 139  --  134* 136 140  --   K 4.6  --  3.3* 4.3 3.8  --   CL 105  --  102 100 104  --   CO2 25  --  _0 --   GLUCOSE 200*  --  273* 125* 89  --   BUN 10  --  _1 --   CREATININE 1.26*  --  1.23* 1.39* 1.68*  --   CALCIUM 8.1*  --  7.1* 6.7* 6.7*  --   MG  --  1.9 1.7 2.1  --   --   PHOS  --  3.2 1.8* 4.1 3.1 3.3    GFR: Estimated Creatinine Clearance: 31.2 mL/min (A) (by C-G formula based on SCr of 1.68 mg/dL (H)). Liver Function Tests: Recent Labs  Lab 12/26/20 1839 12/28/20 0310  AST 36 35  ALT 28 17  ALKPHOS 96 62  BILITOT 0.6 0.3  PROT 6.7 5.4*  ALBUMIN 3.4* 3.0*    No results for input(s): LIPASE, AMYLASE in the last 168 hours. No results for input(s): AMMONIA in the last 168 hours. Coagulation Profile: Recent Labs  Lab 12/26/20 2147  INR 1.0    Cardiac Enzymes: No results for input(s): CKTOTAL, CKMB, CKMBINDEX, TROPONINI in the last 168 hours. BNP (last 3 results) No results for input(s): PROBNP in the last 8760 hours. HbA1C: No results for input(s): HGBA1C in the last 72 hours. CBG: Recent Labs  Lab 12/28/20 2111 12/29/20 0742 12/29/20 1146 12/29/20 1642 12/29/20 2154  GLUCAP 125* 84 97 127* 113*    Lipid Profile: No results for input(s): CHOL, HDL, LDLCALC, TRIG, CHOLHDL, LDLDIRECT in the last 72 hours.  Thyroid Function Tests: No results for input(s): TSH, T4TOTAL, FREET4, T3FREE, THYROIDAB in the last 72 hours.  Anemia Panel: No results for input(s): VITAMINB12, FOLATE, FERRITIN, TIBC, IRON, RETICCTPCT in the last 72 hours. Sepsis Labs: Recent Labs  Lab 12/26/20 2147 12/27/20 0303 12/28/20 0310 12/29/20 0550  PROCALCITON  --  0.32 0.44 0.37  LATICACIDVEN 2.6*  --  1.7  --      Recent Results (from the past 240 hour(s))  SARS CORONAVIRUS 2 (TAT 6-24 HRS) Nasopharyngeal Nasopharyngeal Swab     Status: None   Collection Time: 12/24/20  2:38 PM    Specimen: Nasopharyngeal Swab  Result Value Ref Range Status   SARS Coronavirus 2 NEGATIVE NEGATIVE Final    Comment: (NOTE) SARS-CoV-2 target nucleic acids are NOT DETECTED.  The SARS-CoV-2 RNA is generally detectable in upper and lower respiratory specimens during the acute phase of infection. Negative results do not preclude SARS-CoV-2 infection, do not rule out co-infections with other pathogens, and should not be used as the sole basis for treatment or  other patient management decisions. Negative results must be combined with clinical observations, patient history, and epidemiological information. The expected result is Negative.  Fact Sheet for Patients: SugarRoll.be  Fact Sheet for Healthcare Providers: https://www.woods-mathews.com/  This test is not yet approved or cleared by the [place] FDA and  has been authorized for detection and/or diagnosis of SARS-CoV-2 by FDA under an Emergency Use Authorization (EUA). This EUA will remain  in effect (meaning this test can be used) for the duration of the COVID-19 declaration under Se ction 564(b)(1) of the Act, 21 U.S.C. section 360bbb-3(b)(1), unless the authorization is terminated or revoked sooner.  Performed at [facility], [place] [address] [place], [place] [address]   MRSA PCR Screening     Status: None   Collection Time: 12/26/20  8:00 PM  Result Value Ref Range Status   MRSA by PCR NEGATIVE NEGATIVE Final    Comment:        The GeneXpert MRSA Assay (FDA approved for NASAL specimens only), is one component of a comprehensive MRSA colonization surveillance program. It is not intended to diagnose MRSA infection nor to guide or monitor treatment for MRSA infections. Performed at [facility], [place] [address], [place],  [address]           Radiology Studies: ECHOCARDIOGRAM COMPLETE  Result Date: 12/28/2020    ECHOCARDIOGRAM  REPORT   Patient Name:   [name] [facility] Date of Exam: 12/28/2020 Medical Rec #:  [id number]          Height:       67.0 in Accession #:    [id number]         Weight:       176.4 lb Date of Birth:  1945-07-22          BSA:          1.917 m Patient Age:    73 years           BP:           114/54 mmHg Patient Gender: F                  HR:           87 bpm. Exam Location:  Inpatient Procedure: 2D Echo, Cardiac Doppler and Color Doppler Indications:    [place] respiratory distress R06.03  History:        Patient has no prior history of Echocardiogram examinations.  Sonographer:    Merrie Roof RDCS Referring Phys: [place]  1. Left ventricular ejection fraction, by estimation, is 60 to 65%. The left ventricle has normal function. The left ventricle has no regional wall motion abnormalities. Left ventricular diastolic parameters were normal.  2. Right ventricular systolic function is normal. The right ventricular size is normal.  3. The mitral valve is normal in structure. Trivial mitral valve regurgitation.  4. The aortic valve is normal in structure. Aortic valve regurgitation is mild. No aortic stenosis is present. FINDINGS  Left Ventricle: Left ventricular ejection fraction, by estimation, is 60 to 65%. The left ventricle has normal function. The left ventricle has no regional wall motion abnormalities. The left ventricular internal cavity size was normal in size. There is  no left ventricular hypertrophy. Left ventricular diastolic parameters were normal. Right Ventricle: The right ventricular size is normal. Right vetricular wall thickness was not well visualized. Right ventricular systolic function is normal. Left Atrium: Left atrial size was normal in size.  Right Atrium: Right atrial size was normal in size. Pericardium: There is no evidence of pericardial effusion. Mitral Valve: The mitral valve is normal in structure. Trivial mitral valve regurgitation. Tricuspid Valve: The tricuspid  valve is normal in structure. Tricuspid valve regurgitation is trivial. Aortic Valve: The aortic valve is normal in structure. Aortic valve regurgitation is mild. No aortic stenosis is present. Aortic valve mean gradient measures 3.5 mmHg. Aortic valve peak gradient measures 6.9 mmHg. Aortic valve area, by VTI measures 1.72 cm. Pulmonic Valve: The pulmonic valve was grossly normal. Pulmonic valve regurgitation is not visualized. Aorta: The aortic root and ascending aorta are structurally normal, with no evidence of dilitation. IAS/Shunts: The atrial septum is grossly normal.  LEFT VENTRICLE PLAX 2D LVIDd:         3.50 cm  Diastology LVIDs:         2.60 cm  LV e' medial:    7.51 cm/s LV PW:         1.00 cm  LV E/e' medial:  9.9 LV IVS:        0.90 cm  LV e' lateral:   6.74 cm/s LVOT diam:     1.70 cm  LV E/e' lateral: 11.0 LV SV:         40 LV SV Index:   21 LVOT Area:     2.27 cm  RIGHT VENTRICLE RV Basal diam:  3.20 cm LEFT ATRIUM             Index       RIGHT ATRIUM           Index LA diam:        3.30 cm 1.72 cm/m  RA Area:     10.60 cm LA Vol (A2C):   57.9 ml 30.20 ml/m RA Volume:   19.10 ml  9.96 ml/m LA Vol (A4C):   50.5 ml 26.34 ml/m LA Biplane Vol: 55.5 ml 28.95 ml/m  AORTIC VALVE AV Area (Vmax):    1.78 cm AV Area (Vmean):   1.74 cm AV Area (VTI):     1.72 cm AV Vmax:           131.00 cm/s AV Vmean:          88.700 cm/s AV VTI:            0.230 m AV Peak Grad:      6.9 mmHg AV Mean Grad:      3.5 mmHg LVOT Vmax:         102.75 cm/s LVOT Vmean:        68.150 cm/s LVOT VTI:          0.175 m LVOT/AV VTI ratio: 0.76  AORTA Ao Root diam: 2.80 cm Ao Asc diam:  2.60 cm MITRAL VALVE MV Area (PHT): 5.23 cm    SHUNTS MV Decel Time: 145 msec    Systemic VTI:  0.18 m MV E velocity: 74.10 cm/s  Systemic Diam: 1.70 cm MV A velocity: 72.00 cm/s MV E/A ratio:  1.03 Mertie Moores MD Electronically signed by Mertie Moores MD Signature Date/Time: 12/28/2020/6:13:11 PM    Final         Scheduled Meds:   benztropine  0.5 mg Oral BID   chlorhexidine gluconate (MEDLINE KIT)  15 mL Mouth Rinse BID   Chlorhexidine Gluconate Cloth  6 each Topical Daily   docusate sodium  100 mg Oral BID   escitalopram  10 mg Oral Daily   hydroxychloroquine  400 mg  Oral Daily   insulin aspart  0-5 Units Subcutaneous QHS   insulin aspart  0-9 Units Subcutaneous TID WC   metoprolol tartrate  12.5 mg Oral BID   OLANZapine  2.5 mg Oral Daily   pantoprazole (PROTONIX) IV  40 mg Intravenous QHS   polyethylene glycol  17 g Oral BID   predniSONE  5 mg Per Tube Q breakfast   rOPINIRole  0.25 mg Oral QHS   Continuous Infusions:  ceFEPime (MAXIPIME) IV 2 g (12/29/20 2320)     LOS: 4 days    Time spent: 35 minutes.     Elmarie Shiley, MD [facility]   If 7PM-7AM, please contact night-coverage [email or web address]  12/30/2020, 7:23 AM

## 2020-12-30 NOTE — Progress Notes (Signed)
Sitting in chair eating lunch. Looks good. Tolerating reg diet. + BM.  Vitals:   12/29/20 2153 12/30/20 0416  BP: 127/61 (!) 147/70  Pulse: 80 78  Resp: 20 20  Temp: 98.3 F (36.8 C) 98 F (36.7 C)  SpO2: 98% 99%    Intake/Output Summary (Last 24 hours) at 12/30/2020 1241 Last data filed at 12/30/2020 0900 Gross per 24 hour  Intake 840 ml  Output 1245 ml  Net -405 ml   Sitting in chair Eating lunch Alert and Oriented CV - RRR Resp - reg effort and depth Abd - soft, NT, JP scant Ext - no calf pain or swelling GU - urine clear, foley in place  POD#4 Left Neph-U -continue current care -to SNF likely tomorrow -continue foley, will need JP removed

## 2020-12-30 NOTE — Progress Notes (Signed)
End of Shift:  No acute changes overnight. Patient remains resting in bed at end of shift with call bell in reach. No s/s of distress. Placed new PIV on right forearm after left PiV infiltrated. Foley catheter still intact and in place.

## 2020-12-30 NOTE — Progress Notes (Signed)
Patient's peripheral I.V. in left forearm infiltrated. Edema in immediate area from infiltration present. Two hot packs were placed on area for comfort.

## 2020-12-30 NOTE — Progress Notes (Signed)
No cpap for tonight, pt refused.

## 2020-12-31 LAB — CREATININE, FLUID (PLEURAL, PERITONEAL, JP DRAINAGE): Creat, Fluid: 1.3 mg/dL

## 2020-12-31 LAB — GLUCOSE, CAPILLARY
Glucose-Capillary: 78 mg/dL (ref 70–99)
Glucose-Capillary: 120 mg/dL — ABNORMAL HIGH (ref 70–99)
Glucose-Capillary: 95 mg/dL (ref 70–99)
Glucose-Capillary: 85 mg/dL (ref 70–99)

## 2020-12-31 LAB — CBC
HCT: 27.5 % — ABNORMAL LOW (ref 36.0–46.0)
Hemoglobin: 8.5 g/dL — ABNORMAL LOW (ref 12.0–15.0)
MCH: 30.8 pg (ref 26.0–34.0)
MCHC: 30.9 g/dL (ref 30.0–36.0)
MCV: 99.6 fL (ref 80.0–100.0)
Platelets: 122 10*3/uL — ABNORMAL LOW (ref 150–400)
RBC: 2.76 MIL/uL — ABNORMAL LOW (ref 3.87–5.11)
RDW: 15.6 % — ABNORMAL HIGH (ref 11.5–15.5)
WBC: 5.8 10*3/uL (ref 4.0–10.5)
nRBC: 0 % (ref 0.0–0.2)

## 2020-12-31 LAB — BASIC METABOLIC PANEL
Anion gap: 6 (ref 5–15)
BUN: 22 mg/dL (ref 8–23)
CO2: 26 mmol/L (ref 22–32)
Calcium: 7.2 mg/dL — ABNORMAL LOW (ref 8.9–10.3)
Chloride: 110 mmol/L (ref 98–111)
Creatinine, Ser: 1.29 mg/dL — ABNORMAL HIGH (ref 0.44–1.00)
GFR, Estimated: 43 mL/min — ABNORMAL LOW (ref >60)
Glucose, Bld: 83 mg/dL (ref 70–99)
Potassium: 4.1 mmol/L (ref 3.5–5.1)
Sodium: 142 mmol/L (ref 135–145)

## 2020-12-31 LAB — PHOSPHORUS: Phosphorus: 2.6 mg/dL (ref 2.5–4.6)

## 2020-12-31 MED ORDER — POLYETHYLENE GLYCOL 3350 17 G PO PACK: 17.0000 g | PACK | Freq: Two times a day (BID) | ORAL | 0 refills | Status: AC

## 2020-12-31 MED ORDER — AMOXICILLIN-POT CLAVULANATE 875-125 MG PO TABS: 1.0000 | ORAL_TABLET | Freq: Two times a day (BID) | ORAL | 0 refills | Status: AC

## 2020-12-31 MED ORDER — FERROUS SULFATE 325 (65 FE) MG PO TABS: 325.0000 mg | ORAL_TABLET | Freq: Every day | ORAL | 3 refills | Status: DC

## 2020-12-31 MED ORDER — FERROUS SULFATE 325 (65 FE) MG PO TABS
325.0000 mg | ORAL_TABLET | Freq: Every day | ORAL | Status: DC
  Administered 2020-12-31 – 2021-01-01 (×2): 325 mg via ORAL
  Filled 2020-12-31 (×2): qty 1

## 2020-12-31 NOTE — TOC Progression Note (Addendum)
Transition of Care [facility]) - Progression Note    Patient Details  Name: ALEAH AHLGRIM MRN: [id number] Date of Birth: 1946-03-02  Transition of [facility]) CM/SW Contact  Kennis Buell, Juliann Pulse, RN Phone Number: 12/31/2020, 1:07 PM  Clinical Narrative: Bed offers given to sister Sandra [phone]-await choice then will start auth,will need covid ordered.  3p-[employer] chosen rep Helene Kelp following. Initiated Thea Silversmith [id number] auth.     Expected Discharge Plan: [place] Barriers to Discharge: Continued Medical Work up  Expected Discharge Plan and Services Expected Discharge Plan: [place]   Discharge Planning Services: CM Consult Post Acute Care Choice: [place] Living arrangements for the past 2 months: Single Family Home                                       Social Determinants of Health (SDOH) Interventions    Readmission Risk Interventions No flowsheet data found.

## 2020-12-31 NOTE — Progress Notes (Signed)
5 Days Post-Op Subjective: Patient reports tolerating PO and pain control good.  She was up to chair over the weekend. Tolerating reg diet.  Multiple BMs. O2 via Alex put on last night while she was sleeping. JP Cr same as serum.   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  Physical Exam:  General:alert, cooperative, and no distress GI: soft, non tender, normal bowel sounds, no palpable masses Incisions: C/D/I JP: minimal serosang drainage  Lab Results: Recent Labs    12/29/20 0550 12/30/20 0826  HGB 9.2* 9.0*  HCT 29.9* 29.4*   BMET Recent Labs    12/29/20 0550 12/30/20 0826  NA 140 140  K 3.8 3.8  CL 104 107  CO2 28 25  GLUCOSE 89 81  BUN 18 24*  CREATININE 1.68* 1.29*  CALCIUM 6.7* 7.0*   No results for input(s): LABPT, INR in the last 72 hours. No results for input(s): LABURIN in the last 72 hours. Results for orders placed or performed during the hospital encounter of 12/26/20  MRSA PCR Screening     Status: None   Collection Time: 12/26/20  8:00 PM  Result Value Ref Range Status   MRSA by PCR NEGATIVE NEGATIVE Final    Comment:        The GeneXpert MRSA Assay (FDA approved for NASAL specimens only), is one component of a comprehensive MRSA colonization surveillance program. It is  not intended to diagnose MRSA infection nor to guide or monitor treatment for MRSA infections. Performed at [facility], [place] [address], [place], [place] [address]     Studies/Results: No results found.  Assessment/Plan: 5 Days Post-Op Procedure(s) (LRB): XI ROBOT ASSITED LAPAROSCOPIC  LEFT NEPHROURETERECTOMY/ STENT PLACEMENT/ TRANSURETHRAL RESECTION OF LEFT URETERAL ORIFICE;EXCISION OF SPLENULE (Left) Doing well Anemia stable ARI improving D/C foley and JP Continue work with PT and OT SNF when bed available and stable from IM standpoint.    LOS: 5 days   Debbrah Alar 12/31/2020, 7:08 AM

## 2020-12-31 NOTE — Progress Notes (Signed)
Physical Therapy Treatment Patient Details Name: [name] MRN: [id number] DOB: 1945-08-20 Today's Date: 12/31/2020    History of Present Illness 75 year old female presents to hospital 6/15 for scheduled left ureteral stent placement and TUR with known large left tumor within the collecting system. Intraoperative state complicated by grade 1 splenic laceration. Patient extubated, however, in PACU developed respiratory distress with oxygen saturation 53% requiring re-intubation. Extubated 12/27/20. PMH significant for DM, HTn, RA, HLD, depression, anxiety.    PT Comments    Pt in recliner on arrival.  Pt appears very anxious about mobility and assisted to standing however felt to fearful to ambulate so performed marching in place and LE exercises.   Continue to recommend SNF upon d/c.    Follow Up Recommendations  SNF     Equipment Recommendations  Rolling walker with 5" wheels    Recommendations for Other Services       Precautions / Restrictions Precautions Precautions: Fall Precaution Comments: L side JP drain Restrictions Weight Bearing Restrictions: No    Mobility  Bed Mobility               General bed mobility comments: pt in recliner    Transfers Overall transfer level: Needs assistance Equipment used: 2 person hand held assist Transfers: Sit to/from Stand Sit to Stand: Min assist         General transfer comment: verbal cues for hand placement, assist to rise and steady as well as control descent, pt very anxious upon standing and did not feel able to ambulate so instead performed marching in place approx 2 minutes, SpO2 95% on room air, HR 106 bpm  Ambulation/Gait                 Stairs             Wheelchair Mobility    Modified Rankin (Stroke Patients Only)       Balance                                            Cognition Arousal/Alertness: Awake/alert Behavior During Therapy: Anxious Overall  Cognitive Status: Within Functional Limits for tasks assessed                                 General Comments: very anxious with mobility      Exercises General Exercises - Lower Extremity Ankle Circles/Pumps: AROM;10 reps;Both Long Arc Quad: AROM;Both;10 reps;Seated Hip Flexion/Marching: AROM;Both;10 reps;Seated    General Comments        Pertinent Vitals/Pain Pain Assessment: Faces Faces Pain Scale: Hurts even more Pain Location: left flank Pain Descriptors / Indicators: Aching;Sore;Grimacing;Guarding Pain Intervention(s): Monitored during session;Repositioned    Home Living                      Prior Function            PT Goals (current goals can now be found in the care plan section) Progress towards PT goals: Progressing toward goals    Frequency    Min 2X/week      PT Plan Current plan remains appropriate    Co-evaluation              AM-PAC PT "6 Clicks" Mobility   Outcome Measure  Help needed  turning from your back to your side while in a flat bed without using bedrails?: A Lot Help needed moving from lying on your back to sitting on the side of a flat bed without using bedrails?: A Lot Help needed moving to and from a bed to a chair (including a wheelchair)?: A Lot Help needed standing up from a chair using your arms (e.g., wheelchair or bedside chair)?: A Lot Help needed to walk in hospital room?: Total Help needed climbing 3-5 steps with a railing? : Total 6 Click Score: 10    End of Session Equipment Utilized During Treatment: Gait belt Activity Tolerance: Other (comment) (limited by anxiety) Patient left: in chair;with call bell/phone within reach Nurse Communication: Mobility status PT Visit Diagnosis: Muscle weakness (generalized) (M62.81);Difficulty in walking, not elsewhere classified (R26.2);Unsteadiness on feet (R26.81);Other abnormalities of gait and mobility (R26.89)     Time: 4098-1191 PT Time  Calculation (min) (ACUTE ONLY): 15 min  Charges:  $Therapeutic Exercise: 8-22 mins                    Jannette Spanner PT, DPT Acute Rehabilitation Services Pager: [phone] Office: [phone]  York Ram E 12/31/2020, 12:56 PM

## 2020-12-31 NOTE — Progress Notes (Signed)
Tried placing patient on her CPAP for the night.  Patient started complaining about the pressure, kept asking, how long do I have to wear this?  I explained that she needs to wear all night and patient stated that she could not stand that all night.  Patient wanted the mask off and stated again that she cannot do it, so I placed patient back on [place] 2L.

## 2020-12-31 NOTE — Progress Notes (Signed)
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   Intraoperative prostate complicated by grade 1 splenic laceration.  Patient was extubated.  However in PACU developed respiratory distress with oxygen saturation 53% requiring reintubation.  Critical care was consulted.  Patient was treated for pulmonary edema and questionable aspiration pneumonia.  Received IV Lasix and IV cefepime.  Assessment & Plan:   Active Problems:   Renal mass   1-Acute Hypoxic Respiratory Failure: History of sleep apnea In the postoperative setting with history of a sleep apnea, bilateral interstitial infiltrates. -Received IV Lasix.  On IV Cefepime. Day 5, plan to complete total 7 days. Augmentin added to discharge meds.  -PNA vs Pulmonary edema.  -Continue with antibiotics, pro-calcitonin elevated.  -Hold on lasix due to increase in cr  -ECHO normal EF and diastolic dysfunction.  -She was wean off oxygen. On room air today.   2-AKI: in the post operative setting of left Nephro-ureterectomy:  Hold IV lasix today.  Cr down to 1.2 from 1.6  Stable.   3-Urothelia Carcinoma S/P left Nephro-ureterectomy:  Per urology Awaiting Bx.   4-DM Type 2:  SSI.   5-HTN; Continue with metoprolol.   RA; Continue with prednisone, hydroxychloroquine, leflunamide.    HLD; On statins.   Depression:  On Onlazapine and escitalopram.   Hypokalemia, hypomagnesemia, hypophosphatemia;  Normalized.   Anemia; monitor hb.  Start ferrous sulfate     Nutrition Problem: Inadequate oral  intake Etiology: inability to eat    Signs/Symptoms: NPO status    Interventions: Refer to RD note for recommendations  Estimated body mass index is 27.07 kg/m as calculated from the following:   Height as of this encounter: _0  (1.702 m).   Weight as of this encounter: 78.4 kg.   DVT prophylaxis: SCD Code Status: Full code Family Communication: care discuss with patient Disposition Plan:  Status is: Inpatient  Remains inpatient appropriate because:IV treatments appropriate due to intensity of illness or inability to take PO  Dispo: The patient is from: Home              Anticipated d/c is to: SNF              Patient currently is not medically stable to d/c.   Difficult to place patient No        Consultants:  Urology primary   Procedures:  ECHO  Antimicrobials:    Subjective: Denies dyspnea. Denies significant pain   Objective: Vitals:   12/30/20 0416 12/30/20 1432 12/30/20 2119 12/31/20 0510  BP: (!) 147/70 139/72 133/67 127/69  Pulse: 78 90 73 83  Resp: _1 Temp: 98 F (36.7 C) 98.2 F (36.8 C) 98 F (36.7 C) 98.1 F (36.7 C)  TempSrc: Oral Oral Oral Oral  SpO2: 99% 99% 99% 99%  Weight:      Height:        Intake/Output Summary (Last 24 hours) at 12/31/2020 1020 Last data filed at 12/31/2020 0941 Gross per 24 hour  Intake 860.07 ml  Output 1816 ml  Net -955.93 ml    Filed Weights   12/28/20 0418 12/29/20 0500  12/29/20 0542  Weight: 80 kg 78.4 kg 78.4 kg    Examination:  General exam: NAD Respiratory system: CTA Cardiovascular system: S 1, S 2 RRR Gastrointestinal system: BS present, soft, nt Central nervous system: Alert Extremities: Symmetric power   Data Reviewed: I have personally reviewed following labs and imaging studies  CBC: Recent Labs  Lab 12/26/20 1839 12/27/20 0303 12/28/20 0310 12/29/20 0550 12/30/20 0826 12/31/20 0509  WBC 11.7* 9.6 9.8 6.6 7.4 5.8  NEUTROABS 10.1*  --   --   --   --   --    HGB 11.7* 9.1* 8.6* 9.2* 9.0* 8.5*  HCT 38.0 28.7* 27.6* 29.9* 29.4* 27.5*  MCV 100.3* 97.0 99.3 98.7 100.7* 99.6  PLT 159 105* 109* 114* 117* 122*    Basic Metabolic Panel: Recent Labs  Lab 12/26/20 2147 12/27/20 0303 12/28/20 0310 12/29/20 0550 12/30/20 0550 12/30/20 0826 12/31/20 0509  NA  --  134* 136 140  --  140 142  K  --  3.3* 4.3 3.8  --  3.8 4.1  CL  --  102 100 104  --  107 110  CO2  --  _0 --  25 26  GLUCOSE  --  273* 125* 89  --  81 83  BUN  --  _1 --  24* 22  CREATININE  --  1.23* 1.39* 1.68*  --  1.29* 1.29*  CALCIUM  --  7.1* 6.7* 6.7*  --  7.0* 7.2*  MG 1.9 1.7 2.1  --   --   --   --   PHOS 3.2 1.8* 4.1 3.1 3.3  --  2.6    GFR: Estimated Creatinine Clearance: 40.6 mL/min (A) (by C-G formula based on SCr of 1.29 mg/dL (H)). Liver Function Tests: Recent Labs  Lab 12/26/20 1839 12/28/20 0310  AST 36 35  ALT 28 17  ALKPHOS 96 62  BILITOT 0.6 0.3  PROT 6.7 5.4*  ALBUMIN 3.4* 3.0*    No results for input(s): LIPASE, AMYLASE in the last 168 hours. No results for input(s): AMMONIA in the last 168 hours. Coagulation Profile: Recent Labs  Lab 12/26/20 2147  INR 1.0    Cardiac Enzymes: No results for input(s): CKTOTAL, CKMB, CKMBINDEX, TROPONINI in the last 168 hours. BNP (last 3 results) No results for input(s): PROBNP in the last 8760 hours. HbA1C: No results for input(s): HGBA1C in the last 72 hours. CBG: Recent Labs  Lab 12/30/20 0752 12/30/20 1211 12/30/20 1633 12/30/20 2117 12/31/20 0735  GLUCAP 85 158* 146* 118* 78    Lipid Profile: No results for input(s): CHOL, HDL, LDLCALC, TRIG, CHOLHDL, LDLDIRECT in the last 72 hours.  Thyroid Function Tests: No results for input(s): TSH, T4TOTAL, FREET4, T3FREE, THYROIDAB in the last 72 hours.  Anemia Panel: No results for input(s): VITAMINB12, FOLATE, FERRITIN, TIBC, IRON, RETICCTPCT in the last 72 hours. Sepsis Labs: Recent Labs  Lab 12/26/20 2147 12/27/20 0303  12/28/20 0310 12/29/20 0550  PROCALCITON  --  0.32 0.44 0.37  LATICACIDVEN 2.6*  --  1.7  --      Recent Results (from the past 240 hour(s))  SARS CORONAVIRUS 2 (TAT 6-24 HRS) Nasopharyngeal Nasopharyngeal Swab     Status: None   Collection Time: 12/24/20  2:38 PM   Specimen: Nasopharyngeal Swab  Result Value Ref Range Status   SARS Coronavirus 2 NEGATIVE NEGATIVE Final    Comment: (NOTE) SARS-CoV-2 target nucleic acids are NOT DETECTED.  The SARS-CoV-2  RNA is generally detectable in upper and lower respiratory specimens during the acute phase of infection. Negative results do not preclude SARS-CoV-2 infection, do not rule out co-infections with other pathogens, and should not be used as the sole basis for treatment or other patient management decisions. Negative results must be combined with clinical observations, patient history, and epidemiological information. The expected result is Negative.  Fact Sheet for Patients: SugarRoll.be  Fact Sheet for Healthcare Providers: https://www.woods-mathews.com/  This test is not yet approved or cleared by the [place] FDA and  has been authorized for detection and/or diagnosis of SARS-CoV-2 by FDA under an Emergency Use Authorization (EUA). This EUA will remain  in effect (meaning this test can be used) for the duration of the COVID-19 declaration under Se ction 564(b)(1) of the Act, 21 U.S.C. section 360bbb-3(b)(1), unless the authorization is terminated or revoked sooner.  Performed at [facility], [place] [address], [place], [place] [address]   MRSA PCR Screening     Status: None   Collection Time: 12/26/20  8:00 PM  Result Value Ref Range Status   MRSA by PCR NEGATIVE NEGATIVE Final    Comment:        The GeneXpert MRSA Assay (FDA approved for NASAL specimens only), is one component of a comprehensive MRSA colonization surveillance program. It is not intended to diagnose  MRSA infection nor to guide or monitor treatment for MRSA infections. Performed at [facility], [place] [address], [place], [place] [address]           Radiology Studies: No results found.      Scheduled Meds:  benztropine  0.5 mg Oral BID   chlorhexidine gluconate (MEDLINE KIT)  15 mL Mouth Rinse BID   Chlorhexidine Gluconate Cloth  6 each Topical Daily   docusate sodium  100 mg Oral BID   escitalopram  10 mg Oral Daily   hydroxychloroquine  400 mg Oral Daily   insulin aspart  0-5 Units Subcutaneous QHS   insulin aspart  0-9 Units Subcutaneous TID WC   metoprolol tartrate  12.5 mg Oral BID   OLANZapine  2.5 mg Oral Daily   pantoprazole  40 mg Oral QHS   polyethylene glycol  17 g Oral BID   predniSONE  5 mg Per Tube Q breakfast   rOPINIRole  0.25 mg Oral QHS   Continuous Infusions:  ceFEPime (MAXIPIME) IV 2 g (12/31/20 0847)     LOS: 5 days    Time spent: 35 minutes.     Elmarie Shiley, MD [facility]   If 7PM-7AM, please contact night-coverage [email or web address]  12/31/2020, 10:20 AM

## 2021-01-01 DIAGNOSIS — Z743 Need for continuous supervision: Secondary | ICD-10-CM | POA: Diagnosis not present

## 2021-01-01 DIAGNOSIS — I10 Essential (primary) hypertension: Secondary | ICD-10-CM | POA: Diagnosis not present

## 2021-01-01 DIAGNOSIS — J9601 Acute respiratory failure with hypoxia: Secondary | ICD-10-CM | POA: Insufficient documentation

## 2021-01-01 DIAGNOSIS — R278 Other lack of coordination: Secondary | ICD-10-CM | POA: Diagnosis not present

## 2021-01-01 DIAGNOSIS — R404 Transient alteration of awareness: Secondary | ICD-10-CM | POA: Diagnosis not present

## 2021-01-01 DIAGNOSIS — R109 Unspecified abdominal pain: Secondary | ICD-10-CM | POA: Diagnosis not present

## 2021-01-01 DIAGNOSIS — R6889 Other general symptoms and signs: Secondary | ICD-10-CM | POA: Diagnosis not present

## 2021-01-01 DIAGNOSIS — N179 Acute kidney failure, unspecified: Secondary | ICD-10-CM | POA: Diagnosis not present

## 2021-01-01 DIAGNOSIS — E039 Hypothyroidism, unspecified: Secondary | ICD-10-CM | POA: Diagnosis not present

## 2021-01-01 DIAGNOSIS — G4733 Obstructive sleep apnea (adult) (pediatric): Secondary | ICD-10-CM | POA: Diagnosis not present

## 2021-01-01 DIAGNOSIS — R2689 Other abnormalities of gait and mobility: Secondary | ICD-10-CM | POA: Diagnosis not present

## 2021-01-01 DIAGNOSIS — Z794 Long term (current) use of insulin: Secondary | ICD-10-CM | POA: Diagnosis not present

## 2021-01-01 DIAGNOSIS — M6281 Muscle weakness (generalized): Secondary | ICD-10-CM | POA: Diagnosis not present

## 2021-01-01 DIAGNOSIS — N2889 Other specified disorders of kidney and ureter: Secondary | ICD-10-CM | POA: Diagnosis not present

## 2021-01-01 DIAGNOSIS — C652 Malignant neoplasm of left renal pelvis: Secondary | ICD-10-CM | POA: Diagnosis not present

## 2021-01-01 DIAGNOSIS — R5381 Other malaise: Secondary | ICD-10-CM | POA: Diagnosis not present

## 2021-01-01 DIAGNOSIS — D649 Anemia, unspecified: Secondary | ICD-10-CM | POA: Diagnosis not present

## 2021-01-01 DIAGNOSIS — E119 Type 2 diabetes mellitus without complications: Secondary | ICD-10-CM | POA: Diagnosis not present

## 2021-01-01 DIAGNOSIS — M069 Rheumatoid arthritis, unspecified: Secondary | ICD-10-CM | POA: Diagnosis not present

## 2021-01-01 DIAGNOSIS — D3012 Benign neoplasm of left renal pelvis: Secondary | ICD-10-CM | POA: Diagnosis not present

## 2021-01-01 LAB — BASIC METABOLIC PANEL
Anion gap: 5 (ref 5–15)
BUN: 21 mg/dL (ref 8–23)
CO2: 27 mmol/L (ref 22–32)
Calcium: 7.5 mg/dL — ABNORMAL LOW (ref 8.9–10.3)
Chloride: 109 mmol/L (ref 98–111)
Creatinine, Ser: 1.38 mg/dL — ABNORMAL HIGH (ref 0.44–1.00)
GFR, Estimated: 40 mL/min — ABNORMAL LOW (ref >60)
Glucose, Bld: 84 mg/dL (ref 70–99)
Potassium: 4.4 mmol/L (ref 3.5–5.1)
Sodium: 141 mmol/L (ref 135–145)

## 2021-01-01 LAB — CBC
HCT: 27.6 % — ABNORMAL LOW (ref 36.0–46.0)
Hemoglobin: 8.6 g/dL — ABNORMAL LOW (ref 12.0–15.0)
MCH: 30.6 pg (ref 26.0–34.0)
MCHC: 31.2 g/dL (ref 30.0–36.0)
MCV: 98.2 fL (ref 80.0–100.0)
Platelets: 135 10*3/uL — ABNORMAL LOW (ref 150–400)
RBC: 2.81 MIL/uL — ABNORMAL LOW (ref 3.87–5.11)
RDW: 15.4 % (ref 11.5–15.5)
WBC: 5.8 10*3/uL (ref 4.0–10.5)
nRBC: 0 % (ref 0.0–0.2)

## 2021-01-01 LAB — GLUCOSE, CAPILLARY
Glucose-Capillary: 85 mg/dL (ref 70–99)
Glucose-Capillary: 136 mg/dL — ABNORMAL HIGH (ref 70–99)

## 2021-01-01 LAB — SARS CORONAVIRUS 2 (TAT 6-24 HRS): SARS Coronavirus 2: NEGATIVE

## 2021-01-01 NOTE — Progress Notes (Signed)
Patient seen and examined. She is medical ready for discharge. Discharge on 2 days of Augmentin to cover for PNA. Currently on 2 L oxygen. Cr at 1.3. she will need renal function test.

## 2021-01-01 NOTE — Progress Notes (Signed)
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   Intraoperative prostate complicated by grade 1 splenic laceration.  Patient was extubated.  However in PACU developed respiratory distress with oxygen saturation 53% requiring reintubation.  Critical care was consulted.  Patient was treated for pulmonary edema and questionable aspiration pneumonia.  Received IV Lasix and IV cefepime.  Assessment & Plan:   Active Problems:   Spinal stenosis, lumbar region, with neurogenic claudication   Type 2 diabetes mellitus without complication (HCC)   Rheumatoid arthritis ([place])   Hypertension   Hyperlipidemia   MDD (major depressive disorder), recurrent, severe, with psychosis ([place])   Renal mass   1-Acute Hypoxic Respiratory Failure: History of sleep apnea In the postoperative setting with history of a sleep apnea, bilateral interstitial infiltrates. -Received IV Lasix.  On IV Cefepime. Day 5, plan to complete total 7 days. Augmentin added to discharge meds.  -PNA vs Pulmonary edema.  -Continue with antibiotics, pro-calcitonin elevated.  -Hold on lasix due to increase in cr  -ECHO normal EF and diastolic dysfunction.  -Currently on 2 L oxygen.   2-AKI: in the post operative setting of left Nephro-ureterectomy:  Hold IV lasix today.  Cr down to 1.3 from 1.6  Stable.   3-Urothelia Carcinoma S/P left Nephro-ureterectomy:  Per urology Awaiting Bx.   4-DM Type 2:  SSI.   5-HTN; Continue with metoprolol.   RA; Continue with prednisone, hydroxychloroquine,  leflunamide.    HLD; On statins.   Depression:  On Onlazapine and escitalopram.   Hypokalemia, hypomagnesemia, hypophosphatemia;  Normalized.   Anemia; monitor hb.  Started ferrous sulfate                  Estimated body mass index is 30.82 kg/m as calculated from the following:   Height as of this encounter: 5\' 3"  (1.6 m).   Weight as of this encounter: 78.9 kg.   DVT prophylaxis: SCD Code Status: Full code Family Communication: care discuss with patient Disposition Plan:  Status is: Inpatient  Remains inpatient appropriate because:IV treatments appropriate due to intensity of illness or inability to take PO  Dispo: The patient is from: Home              Anticipated d/c is to: SNF              Patient currently is not medically stable to d/c.   Difficult to place patient No        Consultants:  Urology primary   Procedures:  ECHO  Antimicrobials:    Subjective: She is breathing well.  Anxious because she has to go to rehab.    Objective: Vitals:   12/19/20 1312  BP: (!) 149/77  Pulse: 79  Resp: 20  Temp: 98.3 F (36.8 C)  TempSrc: Oral  SpO2: 97%  Weight: 78.9 kg  Height: 5\' 3"  (1.6 m)   No intake or output data in the 24 hours ending 01/01/21 1520  Filed Weights   12/19/20 1312  Weight: 78.9 kg    Examination:  General exam: NAD Respiratory system: CTA Cardiovascular system: SS 1, S 2 RRR Gastrointestinal system: BS present,  soft, nt, incision left LQ healing Central nervous system: Alert   Data Reviewed: I have personally reviewed following labs and imaging studies  CBC: Recent Labs  Lab 12/26/20 1839 12/27/20 0303 12/28/20 0310 12/29/20 0550 12/30/20 0826 12/31/20 0509 01/01/21 0501  WBC 11.7*   < > 9.8 6.6 7.4 5.8 5.8  NEUTROABS 10.1*  --   --   --   --   --   --   HGB 11.7*   < > 8.6* 9.2* 9.0* 8.5* 8.6*  HCT 38.0   < > 27.6* 29.9* 29.4* 27.5* 27.6*  MCV 100.3*   < > 99.3 98.7 100.7* 99.6 98.2  PLT 159    < > 109* 114* 117* 122* 135*   < > = values in this interval not displayed.    Basic Metabolic Panel: Recent Labs  Lab 12/26/20 2147 12/27/20 0303 12/28/20 0310 12/29/20 0550 12/30/20 0550 12/30/20 0826 12/31/20 0509 01/01/21 0501  NA  --  134* 136 140  --  140 142 141  K  --  3.3* 4.3 3.8  --  3.8 4.1 4.4  CL  --  102 100 104  --  107 110 109  CO2  --  24 26 28   --  25 26 27   GLUCOSE  --  273* 125* 89  --  81 83 84  BUN  --  11 12 18   --  24* 22 21  CREATININE  --  1.23* 1.39* 1.68*  --  1.29* 1.29* 1.38*  CALCIUM  --  7.1* 6.7* 6.7*  --  7.0* 7.2* 7.5*  MG 1.9 1.7 2.1  --   --   --   --   --   PHOS 3.2 1.8* 4.1 3.1 3.3  --  2.6  --     GFR: Estimated Creatinine Clearance: 35 mL/min (A) (by C-G formula based on SCr of 1.38 mg/dL (H)). Liver Function Tests: Recent Labs  Lab 12/26/20 1839 12/28/20 0310  AST 36 35  ALT 28 17  ALKPHOS 96 62  BILITOT 0.6 0.3  PROT 6.7 5.4*  ALBUMIN 3.4* 3.0*    No results for input(s): LIPASE, AMYLASE in the last 168 hours. No results for input(s): AMMONIA in the last 168 hours. Coagulation Profile: Recent Labs  Lab 12/26/20 2147  INR 1.0    Cardiac Enzymes: No results for input(s): CKTOTAL, CKMB, CKMBINDEX, TROPONINI in the last 168 hours. BNP (last 3 results) No results for input(s): PROBNP in the last 8760 hours. HbA1C: No results for input(s): HGBA1C in the last 72 hours. CBG: Recent Labs  Lab 12/31/20 1137 12/31/20 1637 12/31/20 2142 01/01/21 0724 01/01/21 1218  GLUCAP 120* 95 85 85 136*    Lipid Profile: No results for input(s): CHOL, HDL, LDLCALC, TRIG, CHOLHDL, LDLDIRECT in the last 72 hours.  Thyroid Function Tests: No results for input(s): TSH, T4TOTAL, FREET4, T3FREE, THYROIDAB in the last 72 hours.  Anemia Panel: No results for input(s): VITAMINB12, FOLATE, FERRITIN, TIBC, IRON, RETICCTPCT in the last 72 hours. Sepsis Labs: Recent Labs  Lab 12/26/20 2147 12/27/20 0303 12/28/20 0310  12/29/20 0550  PROCALCITON  --  0.32 0.44 0.37  LATICACIDVEN 2.6*  --  1.7  --      Recent Results (from the past 240 hour(s))  SARS CORONAVIRUS 2 (TAT 6-24 HRS) Nasopharyngeal Nasopharyngeal Swab     Status: None   Collection Time: 12/24/20  2:38 PM   Specimen: Nasopharyngeal Swab  Result Value Ref Range Status  SARS Coronavirus 2 NEGATIVE NEGATIVE Final    Comment: (NOTE) SARS-CoV-2 target nucleic acids are NOT DETECTED.  The SARS-CoV-2 RNA is generally detectable in upper and lower respiratory specimens during the acute phase of infection. Negative results do not preclude SARS-CoV-2 infection, do not rule out co-infections with other pathogens, and should not be used as the sole basis for treatment or other patient management decisions. Negative results must be combined with clinical observations, patient history, and epidemiological information. The expected result is Negative.  Fact Sheet for Patients: SugarRoll.be  Fact Sheet for Healthcare Providers: https://www.woods-mathews.com/  This test is not yet approved or cleared by the [place] FDA and  has been authorized for detection and/or diagnosis of SARS-CoV-2 by FDA under an Emergency Use Authorization (EUA). This EUA will remain  in effect (meaning this test can be used) for the duration of the COVID-19 declaration under Se ction 564(b)(1) of the Act, 21 U.S.C. section 360bbb-3(b)(1), unless the authorization is terminated or revoked sooner.  Performed at [facility], [place] [address], [place], [place] [address]   MRSA PCR Screening     Status: None   Collection Time: 12/26/20  8:00 PM  Result Value Ref Range Status   MRSA by PCR NEGATIVE NEGATIVE Final    Comment:        The GeneXpert MRSA Assay (FDA approved for NASAL specimens only), is one component of a comprehensive MRSA colonization surveillance program. It is not intended to diagnose  MRSA infection nor to guide or monitor treatment for MRSA infections. Performed at [facility], [place] [address], [place], [place] [address]   SARS CORONAVIRUS 2 (TAT 6-24 HRS) Nasopharyngeal Nasopharyngeal Swab     Status: None   Collection Time: 12/31/20  1:53 PM   Specimen: Nasopharyngeal Swab  Result Value Ref Range Status   SARS Coronavirus 2 NEGATIVE NEGATIVE Final    Comment: (NOTE) SARS-CoV-2 target nucleic acids are NOT DETECTED.  The SARS-CoV-2 RNA is generally detectable in upper and lower respiratory specimens during the acute phase of infection. Negative results do not preclude SARS-CoV-2 infection, do not rule out co-infections with other pathogens, and should not be used as the sole basis for treatment or other patient management decisions. Negative results must be combined with clinical observations, patient history, and epidemiological information. The expected result is Negative.  Fact Sheet for Patients: SugarRoll.be  Fact Sheet for Healthcare Providers: https://www.woods-mathews.com/  This test is not yet approved or cleared by the [place] FDA and  has been authorized for detection and/or diagnosis of SARS-CoV-2 by FDA under an Emergency Use Authorization (EUA). This EUA will remain  in effect (meaning this test can be used) for the duration of the COVID-19 declaration under Se ction 564(b)(1) of the Act, 21 U.S.C. section 360bbb-3(b)(1), unless the authorization is terminated or revoked sooner.  Performed at [facility], [place] [address], [place], [place] [address]           Radiology Studies: No results found.      Scheduled Meds: REM  Continuous Infusions: REM    LOS: 0 days    Time spent: 35 minutes.     Elmarie Shiley, MD [facility]   If 7PM-7AM, please contact night-coverage [email or web address]  01/01/2021, 3:20 PM

## 2021-01-01 NOTE — TOC Transition Note (Addendum)
Transition of Care [facility]) - CM/SW Discharge Note   Patient Details  Name: DRAVEN LAINE MRN: [id number] Date of Birth: July 15, 1945  Transition of Care [facility]) CM/SW Contact:  Dessa Phi, RN Phone Number: 01/01/2021, 12:12 PM   Clinical Narrative:  Aida Puffer #[id number].[facility] rep Helene Kelp aware of d/c today. [facility] for transportation-forms @ nsg station await rm#,tel# for nsg report. CM to call PTAR.  12:30p-rm#110,tel# for report tel#[phone]. [facility] called. No further CM needs.    Final next level of care: Skilled Nursing Facility Barriers to Discharge: No Barriers Identified   Patient Goals and CMS Choice Patient states their goals for this hospitalization and ongoing recovery are:: unable to state intubated [id number]      Discharge Placement PASRR number recieved: 12/29/20            Patient chooses bed at: Other - please specify in the comment section below: ([facility]) Patient to be transferred to facility by: [place] Name of family member notified: mary sister [phone] Patient and family notified of of transfer: 01/01/21  Discharge Plan and Services   Discharge Planning Services: CM Consult Post Acute Care Choice: [place]                               Social Determinants of Health (SDOH) Interventions     Readmission Risk Interventions No flowsheet data found.

## 2021-01-01 NOTE — Progress Notes (Signed)
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 Intraoperative state complicated by grade 1 splenic laceration. Patient extubated, however, in PACU developed respiratory distress with oxygen saturation 53% requiring re-intubation. Extubated 12/27/20. PMH significant for DM, HTn, RA, HLD, depression, anxiety.   OT comments  Patient continues to be limited by anxiety. Expressing wanting to D/C back to group home, tried reasoning with patient that she becomes so anxious she limits her standing to ~1 min at sink before sitting back down. How can she return to independence at group home when she has very limited mobility while in hospital 2* anxiety in pain. Patient cannot elaborate to OT why she is so anxious in standing other than pain in her back/L flank. Patient overall min G to take few steps from sink, needing seated rest break between brushing teeth and rinsing out her mouth. Refused to try standing at sink to wash face and brush hair, completed in chair. Patient likely to D/C to rehab today.    Follow Up Recommendations  SNF    Equipment Recommendations  Other (comment) (defer to next venue)       Precautions / Restrictions Precautions Precautions: Fall Precaution Comments: L side JP drain       Mobility Bed Mobility               General bed mobility comments: in chair    Transfers Overall transfer level: Needs assistance Equipment used: None Transfers: Sit to/from Stand;Stand Pivot Transfers Sit to Stand: Supervision Stand pivot transfers: Min guard       General transfer comment: min G for safety    Balance Overall balance assessment: Needs assistance Sitting-balance support: Feet supported Sitting  balance-Leahy Scale: Good     Standing balance support: Single extremity supported;Bilateral upper extremity supported Standing balance-Leahy Scale: Poor Standing balance comment: reliant on at least single upper extremity support, anxious                           ADL either performed or assessed with clinical judgement   ADL Overall ADL's : Needs assistance/impaired     Grooming: Oral care;Wash/dry face;Brushing hair;Set up;Min guard;Standing Grooming Details (indicate cue type and reason): patient repeatedly asking to sit to down to brush her teeth, provide encouragement and min G assist for safety as she is tremulous. leans heavily onto sink. seated patient wash her face and brush her hair, did stand additional time to rinse her mouth out. anxiety limiting patient             Lower Body Dressing: Supervision/safety;Sitting/lateral leans Lower Body Dressing Details (indicate cue type and reason): patient able to bend LEs up in recliner chair to pull up socks Toilet Transfer: Min Designer, jewellery Details (indicate cue type and reason): min G for safety as patient is highly anxious, no assist to power up from recliner chair and take few steps to sink twice during ADLs         Functional mobility during ADLs: Min guard;Cueing for safety        Cognition Arousal/Alertness: Awake/alert Behavior During Therapy: Anxious Overall Cognitive Status: No family/caregiver present to determine baseline cognitive functioning  General Comments: patient is very anxious with mobility, states she had some anxiety when living at group home but has gotten worse since hospitalization but cannot elaborate why other than pain. patient seeming unsure of her self asking where to spit her toothpaste while at sink, canot problem solve putting O2 tubing on seated in chair                   Pertinent Vitals/ Pain       Pain  Assessment: Faces Faces Pain Scale: Hurts even more Pain Location: back Pain Descriptors / Indicators: Moaning;Grimacing;Restless Pain Intervention(s): Monitored during session         Frequency  Min 2X/week        Progress Toward Goals  OT Goals(current goals can now be found in the care plan section)  Progress towards OT goals: Progressing toward goals  Acute Rehab OT Goals Patient Stated Goal: get back to walking independently OT Goal Formulation: With patient Time For Goal Achievement: 01/11/21 Potential to Achieve Goals: Good ADL Goals Pt Will Perform Upper Body Dressing: with set-up;sitting Pt Will Perform Lower Body Dressing: with min assist;with adaptive equipment;sit to/from stand;sitting/lateral leans Pt Will Transfer to Toilet: with min assist;ambulating;bedside commode Pt Will Perform Toileting - Clothing Manipulation and hygiene: with min assist;sitting/lateral leans;sit to/from stand  Plan Discharge plan remains appropriate       AM-PAC OT "6 Clicks" Daily Activity     Outcome Measure   Help from another person eating meals?: None Help from another person taking care of personal grooming?: A Little Help from another person toileting, which includes using toliet, bedpan, or urinal?: A Little Help from another person bathing (including washing, rinsing, drying)?: A Lot Help from another person to put on and taking off regular upper body clothing?: A Little Help from another person to put on and taking off regular lower body clothing?: A Little 6 Click Score: 18    End of Session Equipment Utilized During Treatment: Oxygen  OT Visit Diagnosis: Unsteadiness on feet (R26.81);Other abnormalities of gait and mobility (R26.89);Muscle weakness (generalized) (M62.81);Pain Pain - part of body:  (back)   Activity Tolerance  (Patient is limited by anxiety)   Patient Left in chair;with call bell/phone within reach   Nurse Communication Mobility status         Time: 6759-1638 OT Time Calculation (min): 19 min  Charges: OT General Charges $OT Visit: 1 Visit OT Treatments $Self Care/Home Management : 8-22 mins  Delbert Phenix OT OT pager: [phone]   Rosemary Holms 01/01/2021, 12:13 PM

## 2021-01-01 NOTE — Plan of Care (Signed)
  Problem: Nutrition: Goal: Adequate nutrition will be maintained Outcome: Completed/Met

## 2021-01-01 NOTE — Discharge Summary (Signed)
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 Briefly, [name] is a 75 y.o. year old patient with a large papillary tumor within the left collecting system. She underwent a diagnostic ureteroscopy on 10/03/2020 with Dr. Gloriann Loan who directly visualized a papillary tumor involving the lower mid pole of the left renal pelvis. Ultimately, biopsy of the lesion came back inconclusive. 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 CTU from March identified a solid and enhancing 1.6 cm mass within the confines of the left renal pelvis.   Hospital Course: While in Pre-op pt was noted to have bilateral LE edema and excoriations on her shins.  Pt stated she was not very ambulatory, therefore, bilateral LE dopplers were completed and DVT was ruled out. Pt was admitted and taken to the OR on 12/26/20 for a robotic assisted lap left nephroureterectomy.  Pt tolerated the procedure well and was hemodynamically stable throughout the procedure.  Pt was extubated and transferred to PACU where she developed respiratory distress with O2 sats of 53%. Pt was re-intubated successfully with O2 sat returning to 100%. Pt was transferred to ICU and followed by CCM.  Post op CXR revealed bilateral interstitial infiltrates.  Differential for post op resp failure included pulmonary edema vs pneumonia. She was started on IV levaqin and ceftriaxone initially which was later switched to cefepime and finally to PO Augmentin.  She received several dose of IV lasix post op. Pt  was extubated without complication on POD 1 and has only required intermittent O2 via [place] since that time. Echo was obtained with normal EF and diastolic dysfunction.  Pt had ARI post operatively that was expected and closely monitored.  This resolved with Cr 1.38 at time of d/c. Pt began PT and OT on POD 1 at which time both services recommended SNF for rehab. Bedside swallow eval was completed on POD 1 as well.  Pt was maintained on dys 3 diet and her acute dysphagia was resolving per Speech path on POD 2.  Pt was transferred to floor on POD 2 as well.  Return of bowel function was noted on POD 3.  Foley and JP drain d/c'd on POD 4.  Pt voided without difficulty after removal. By POD 6 pt is felt to be stable for d/c to SNF for continued care and rehab.   Laboratory values:  Recent Labs    12/30/20 0826 12/31/20 0509 01/01/21 0501  HGB 9.0* 8.5* 8.6*  HCT 29.4* 27.5* 27.6*   Recent Labs    12/31/20 0509 01/01/21 0501  CREATININE 1.29* 1.38*    Disposition: Home  Discharge instruction: The patient was instructed to be ambulatory but told to refrain from heavy lifting, strenuous activity, or driving. Continue PT and OT.  Discharge medications:  Allergies as of 01/01/2021       Reactions   Contrast Media [iodinated Diagnostic Agents] Anaphylaxis, Hives   Only the retina dye;    Abilify [aripiprazole]    Pt dos not recall reaction   Ultram [tramadol Hcl] Other (See Comments)   unknown   Zithromax [azithromycin] Anxiety   Anxiety heightened  Medication List     STOP taking these medications    aspirin 81 MG chewable tablet   aspirin EC 81 MG tablet   atorvastatin 40 MG tablet Commonly known as: LIPITOR   fluticasone 50 MCG/ACT nasal spray Commonly known as: FLONASE   folic acid 1 MG tablet Commonly known as: FOLVITE   risperiDONE 0.5 MG disintegrating tablet Commonly known as: RisperDAL M-TAB       TAKE these medications    Accu-Chek Guide test  strip Generic drug: glucose blood CHECK BLOOD SUGAR ONCE A DAY   amoxicillin-clavulanate 875-125 MG tablet Commonly known as: Augmentin Take 1 tablet by mouth 2 (two) times daily for 2 days.   benztropine 0.5 MG tablet Commonly known as: COGENTIN Take 0.5 mg by mouth 2 (two) times daily.   docusate sodium 100 MG capsule Commonly known as: COLACE Take 1 capsule (100 mg total) by mouth 2 (two) times daily.   escitalopram 10 MG tablet Commonly known as: LEXAPRO Take 10 mg by mouth daily. What changed: Another medication with the same name was removed. Continue taking this medication, and follow the directions you see here.   ferrous sulfate 325 (65 FE) MG tablet Take 1 tablet (325 mg total) by mouth daily with breakfast.   hydroxychloroquine 200 MG tablet Commonly known as: PLAQUENIL Take 400 mg by mouth daily.   leflunomide 20 MG tablet Commonly known as: ARAVA Take 20 mg by mouth daily.   metoprolol succinate 25 MG 24 hr tablet Commonly known as: TOPROL-XL Take 25 mg by mouth daily.   OLANZapine 2.5 MG tablet Commonly known as: ZYPREXA Take 2.5 mg by mouth daily.   polyethylene glycol 17 g packet Commonly known as: MIRALAX / GLYCOLAX Take 17 g by mouth 2 (two) times daily.   predniSONE 5 MG tablet Commonly known as: DELTASONE Take 5 mg by mouth daily with breakfast.   promethazine 12.5 MG tablet Commonly known as: PHENERGAN Take 1 tablet (12.5 mg total) by mouth every 4 (four) hours as needed for nausea or vomiting.        Followup:   Contact information for follow-up providers     Ceasar Mons, MD Follow up on 01/04/2021.   Specialty: Urology Why: at 10:30 Contact information: [address] [place] [address] [phone]              Contact information for after-discharge care     Destination     [facility] .   Service: Skilled Chiropodist information: [address] [place] [phone]

## 2021-01-01 NOTE — Progress Notes (Signed)
PROGRESS NOTE    [name]  XMI:[id number] DOB: 07-30-1945 DOA: 12/19/2020 PCP: Julia Nip, MD   Brief Narrative: 75 year old with past medical history significant for left renal pelvic mass suspicious for high-grade urothelial carcinoma, diabetes, hypertension, hypothyroidism, rheumatoid arthritis, obstructive sleep apnea, who was admitted to the hospital on 6/15 for elective left ureteral stent placement and TUR with no/left tumor within the collecting system.   Intraoperative prostate complicated by grade 1 splenic laceration.  Patient was extubated.  However in PACU developed respiratory distress with oxygen saturation 53% requiring reintubation.  Critical care was consulted.  Patient was treated for pulmonary edema and questionable aspiration pneumonia.  Received IV Lasix and IV cefepime.  Assessment & Plan:   Active Problems:   * No active hospital problems. *   1-Acute Hypoxic Respiratory Failure: History of sleep apnea In the postoperative setting with history of a sleep apnea, bilateral interstitial infiltrates. -Received IV Lasix.  On IV Cefepime. Day 5, plan to complete total 7 days. Augmentin added to discharge meds.  -PNA vs Pulmonary edema.  -Continue with antibiotics, pro-calcitonin elevated.  -Hold on lasix due to increase in cr  -ECHO normal EF and diastolic dysfunction.  -Currently on 2 L.   2-AKI: in the post operative setting of left Nephro-ureterectomy:  Hold IV lasix today.  Cr down to 1.3 from 1.6  Stable.   3-Urothelia Carcinoma S/P left Nephro-ureterectomy:  Per urology Awaiting Bx.   4-DM Type 2:  SSI.   5-HTN; Continue with metoprolol.   RA; Continue with prednisone, hydroxychloroquine, leflunamide.    HLD; On statins.   Depression:  On Onlazapine and escitalopram.   Hypokalemia, hypomagnesemia, hypophosphatemia;  Normalized.   Anemia; monitor hb.  Started  ferrous sulfate    Estimated body mass index is 30.82 kg/m  as calculated from the following:   Height as of this encounter: 5\' 3"  (1.6 m).   Weight as of this encounter: 78.9 kg.   DVT prophylaxis: SCD Code Status: Full code Family Communication: care discuss with patient brother who was at bedside.  Disposition Plan:  Status is: Inpatient  Remains inpatient appropriate because:IV treatments appropriate due to intensity of illness or inability to take PO  Dispo: The patient is from: Home              Anticipated d/c is to: SNF              Patient currently is medically stable to d/c.   Difficult to place patient No        Consultants:  Urology primary   Procedures:  ECHO  Antimicrobials:    Subjective: She is breathing well.  She is anxious about going to Rehab.  Objective: Vitals:   12/19/20 1312  BP: (!) 149/77  Pulse: 79  Resp: 20  Temp: 98.3 F (36.8 C)  TempSrc: Oral  SpO2: 97%  Weight: 78.9 kg  Height: 5\' 3"  (1.6 m)   No intake or output data in the 24 hours ending 01/01/21 1514  Filed Weights   12/19/20 1312  Weight: 78.9 kg    Examination:  General exam: NAD Respiratory system: CTA Cardiovascular system: S 1, S 2 RRR Gastrointestinal system: BS present, soft,  nt  Data Reviewed: I have personally reviewed following labs and imaging studies  CBC: Recent Labs  Lab 12/26/20 1839 12/27/20 0303 12/28/20 0310 12/29/20 0550 12/30/20 0826 12/31/20 0509 01/01/21 0501  WBC 11.7*   < > 9.8 6.6 7.4 5.8 5.8  NEUTROABS 10.1*  --   --   --   --   --   --   HGB 11.7*   < > 8.6* 9.2* 9.0* 8.5* 8.6*  HCT 38.0   < > 27.6* 29.9* 29.4* 27.5* 27.6*  MCV 100.3*   < > 99.3 98.7 100.7* 99.6 98.2  PLT 159   < > 109* 114* 117* 122* 135*   < > = values in this interval not displayed.    Basic Metabolic Panel: Recent Labs  Lab 12/26/20 2147 12/27/20 0303 12/28/20 0310 12/29/20 0550 12/30/20 0550 12/30/20 0826 12/31/20 0509 01/01/21 0501  NA  --  134* 136 140  --  140 142 141  K  --  3.3* 4.3 3.8  --   3.8 4.1 4.4  CL  --  102 100 104  --  107 110 109  CO2  --  24 26 28   --  25 26 27   GLUCOSE  --  273* 125* 89  --  81 83 84  BUN  --  11 12 18   --  24* 22 21  CREATININE  --  1.23* 1.39* 1.68*  --  1.29* 1.29* 1.38*  CALCIUM  --  7.1* 6.7* 6.7*  --  7.0* 7.2* 7.5*  MG 1.9 1.7 2.1  --   --   --   --   --   PHOS 3.2 1.8* 4.1 3.1 3.3  --  2.6  --     GFR: Estimated Creatinine Clearance: 35 mL/min (A) (by C-G formula based on SCr of 1.38 mg/dL (H)). Liver Function Tests: Recent Labs  Lab 12/26/20 1839 12/28/20 0310  AST 36 35  ALT 28 17  ALKPHOS 96 62  BILITOT 0.6 0.3  PROT 6.7 5.4*  ALBUMIN 3.4* 3.0*    No results for input(s): LIPASE, AMYLASE in the last 168 hours. No results for input(s): AMMONIA in the last 168 hours. Coagulation Profile: Recent Labs  Lab 12/26/20 2147  INR 1.0    Cardiac Enzymes: No results for input(s): CKTOTAL, CKMB, CKMBINDEX, TROPONINI in the last 168 hours. BNP (last 3 results) No results for input(s): PROBNP in the last 8760 hours. HbA1C: No results for input(s): HGBA1C in the last 72 hours. CBG: Recent Labs  Lab 12/31/20 1137 12/31/20 1637 12/31/20 2142 01/01/21 0724 01/01/21 1218  GLUCAP 120* 95 85 85 136*    Lipid Profile: No results for input(s): CHOL, HDL, LDLCALC, TRIG, CHOLHDL, LDLDIRECT in the last 72 hours.  Thyroid Function Tests: No results for input(s): TSH, T4TOTAL, FREET4, T3FREE, THYROIDAB in the last 72 hours.  Anemia Panel: No results for input(s): VITAMINB12, FOLATE, FERRITIN, TIBC, IRON, RETICCTPCT in the last 72 hours. Sepsis Labs: Recent Labs  Lab 12/26/20 2147 12/27/20 0303 12/28/20 0310 12/29/20 0550  PROCALCITON  --  0.32 0.44 0.37  LATICACIDVEN 2.6*  --  1.7  --      Recent Results (from the past 240 hour(s))  SARS CORONAVIRUS 2 (TAT 6-24 HRS) Nasopharyngeal Nasopharyngeal Swab     Status: None   Collection Time: 12/24/20  2:38 PM   Specimen: Nasopharyngeal Swab  Result Value Ref Range Status    SARS Coronavirus 2 NEGATIVE NEGATIVE Final    Comment: (NOTE) SARS-CoV-2 target nucleic acids are NOT DETECTED.  The SARS-CoV-2 RNA is generally detectable in upper and lower respiratory specimens during the acute phase of infection. Negative results do not preclude SARS-CoV-2 infection, do not rule out co-infections with other pathogens, and should not be  used as the sole basis for treatment or other patient management decisions. Negative results must be combined with clinical observations, patient history, and epidemiological information. The expected result is Negative.  Fact Sheet for Patients: SugarRoll.be  Fact Sheet for Healthcare Providers: https://www.woods-mathews.com/  This test is not yet approved or cleared by the [place] FDA and  has been authorized for detection and/or diagnosis of SARS-CoV-2 by FDA under an Emergency Use Authorization (EUA). This EUA will remain  in effect (meaning this test can be used) for the duration of the COVID-19 declaration under Se ction 564(b)(1) of the Act, 21 U.S.C. section 360bbb-3(b)(1), unless the authorization is terminated or revoked sooner.  Performed at [facility], [place] [address], [place], [place] [address]   MRSA PCR Screening     Status: None   Collection Time: 12/26/20  8:00 PM  Result Value Ref Range Status   MRSA by PCR NEGATIVE NEGATIVE Final    Comment:        The GeneXpert MRSA Assay (FDA approved for NASAL specimens only), is one component of a comprehensive MRSA colonization surveillance program. It is not intended to diagnose MRSA infection nor to guide or monitor treatment for MRSA infections. Performed at [facility], [place] [address], [place], [place] [address]   SARS CORONAVIRUS 2 (TAT 6-24 HRS) Nasopharyngeal Nasopharyngeal Swab     Status: None   Collection Time: 12/31/20  1:53 PM   Specimen: Nasopharyngeal Swab  Result Value  Ref Range Status   SARS Coronavirus 2 NEGATIVE NEGATIVE Final    Comment: (NOTE) SARS-CoV-2 target nucleic acids are NOT DETECTED.  The SARS-CoV-2 RNA is generally detectable in upper and lower respiratory specimens during the acute phase of infection. Negative results do not preclude SARS-CoV-2 infection, do not rule out co-infections with other pathogens, and should not be used as the sole basis for treatment or other patient management decisions. Negative results must be combined with clinical observations, patient history, and epidemiological information. The expected result is Negative.  Fact Sheet for Patients: SugarRoll.be  Fact Sheet for Healthcare Providers: https://www.woods-mathews.com/  This test is not yet approved or cleared by the [place] FDA and  has been authorized for detection and/or diagnosis of SARS-CoV-2 by FDA under an Emergency Use Authorization (EUA). This EUA will remain  in effect (meaning this test can be used) for the duration of the COVID-19 declaration under Se ction 564(b)(1) of the Act, 21 U.S.C. section 360bbb-3(b)(1), unless the authorization is terminated or revoked sooner.  Performed at [facility], [place] [address] [place], [place] [address]           Radiology Studies: No results found.      Scheduled Meds: REM  Continuous Infusions: REM    LOS: 0 days    Time spent: 35 minutes.     Elmarie Shiley, MD [facility]   If 7PM-7AM, please contact night-coverage [email or web address]  01/01/2021, 3:14 PM

## 2021-01-01 NOTE — Progress Notes (Signed)
6 Days Post-Op Subjective: Patient reports passing flatus, + BM, and incisional pain.  Pt reports pain at incision site but has not received pain meds this morning.  She is somewhat anxious this morning.   Objective: Vital signs in last 24 hours: Temp:  [98.2 F (36.8 C)-98.8 F (37.1 C)] 98.2 F (36.8 C) (06/21 0643) Pulse Rate:  [80-90] 80 (06/21 0643) Resp:  [18] 18 (06/21 0643) BP: (93-152)/(53-76) 129/53 (06/21 0643) SpO2:  [95 %-97 %] 97 % (06/21 0643) Weight:  [75.9 kg] 75.9 kg (06/21 0500)  Intake/Output from previous day: 06/20 0701 - 06/21 0700 In: 100 [IV Piggyback:100] Out: 500 [Urine:500] Intake/Output this shift: No intake/output data recorded.  Physical Exam:  General:alert, cooperative, and mild distress GI: soft, mild tenderness, normal bowel sounds, no palpable masses Incisions: C/D/I  Lab Results: Recent Labs    12/30/20 0826 12/31/20 0509 01/01/21 0501  HGB 9.0* 8.5* 8.6*  HCT 29.4* 27.5* 27.6*   BMET Recent Labs    12/31/20 0509 01/01/21 0501  NA 142 141  K 4.1 4.4  CL 110 109  CO2 26 27  GLUCOSE 83 84  BUN 22 21  CREATININE 1.29* 1.38*  CALCIUM 7.2* 7.5*   No results for input(s): LABPT, INR in the last 72 hours. No results for input(s): LABURIN in the last 72 hours. Results for orders placed or performed during the hospital encounter of 12/26/20  MRSA PCR Screening     Status: None   Collection Time: 12/26/20  8:00 PM  Result Value Ref Range Status   MRSA by PCR NEGATIVE NEGATIVE Final    Comment:        The GeneXpert MRSA Assay (FDA approved for NASAL specimens only), is one component of a comprehensive MRSA colonization surveillance program. It is not intended to diagnose MRSA infection nor to guide or monitor treatment for MRSA infections. Performed at [facility], [place] [address] [place] [address] [place], [place] [address]   SARS CORONAVIRUS 2 (TAT 6-24 HRS) Nasopharyngeal Nasopharyngeal Swab     Status: None    Collection Time: 12/31/20  1:53 PM   Specimen: Nasopharyngeal Swab  Result Value Ref Range Status   SARS Coronavirus 2 NEGATIVE NEGATIVE Final    Comment: (NOTE) SARS-CoV-2 target nucleic acids are NOT DETECTED.  The SARS-CoV-2 RNA is generally detectable in upper and lower respiratory specimens during the acute phase of infection. Negative results do not preclude SARS-CoV-2 infection, do not rule out co-infections with other pathogens, and should not be used as the sole basis for treatment or other patient management decisions. Negative results must be combined with clinical observations, patient history, and epidemiological information. The expected result is Negative.  Fact Sheet for Patients: SugarRoll.be  Fact Sheet for Healthcare Providers: https://www.woods-mathews.com/  This test is not yet approved or cleared by the [place] FDA and  has been authorized for detection and/or diagnosis of SARS-CoV-2 by FDA under an Emergency Use Authorization (EUA). This EUA will remain  in effect (meaning this test can be used) for the duration of the COVID-19 declaration under Se ction 564(b)(1) of the Act, 21 U.S.C. section 360bbb-3(b)(1), unless the authorization is terminated or revoked sooner.  Performed at [facility], [place] [address], [place], [place] [address]     Studies/Results: No results found.  Assessment/Plan: 6 Days Post-Op Procedure(s) (LRB): XI ROBOT ASSITED LAPAROSCOPIC  LEFT NEPHROURETERECTOMY/ STENT PLACEMENT/ TRANSURETHRAL RESECTION OF LEFT URETERAL ORIFICE;EXCISION OF SPLENULE (Left) Stable Continue work with PT/OT SNF placement being  arranged by CM.  Appreciate their help  IM recommending 2 more days of IV ABx prior to d/c   LOS: 6 days   Debbrah Alar 01/01/2021, 9:59 AM

## 2021-01-02 DIAGNOSIS — G4733 Obstructive sleep apnea (adult) (pediatric): Secondary | ICD-10-CM | POA: Diagnosis not present

## 2021-01-02 DIAGNOSIS — M069 Rheumatoid arthritis, unspecified: Secondary | ICD-10-CM | POA: Diagnosis not present

## 2021-01-02 DIAGNOSIS — Z794 Long term (current) use of insulin: Secondary | ICD-10-CM | POA: Diagnosis not present

## 2021-01-02 DIAGNOSIS — D3012 Benign neoplasm of left renal pelvis: Secondary | ICD-10-CM | POA: Diagnosis not present

## 2021-01-02 DIAGNOSIS — E119 Type 2 diabetes mellitus without complications: Secondary | ICD-10-CM | POA: Diagnosis not present

## 2021-01-02 DIAGNOSIS — E039 Hypothyroidism, unspecified: Secondary | ICD-10-CM | POA: Diagnosis not present

## 2021-01-02 DIAGNOSIS — D649 Anemia, unspecified: Secondary | ICD-10-CM | POA: Diagnosis not present

## 2021-02-04 DIAGNOSIS — D49512 Neoplasm of unspecified behavior of left kidney: Secondary | ICD-10-CM | POA: Diagnosis not present

## 2021-02-25 ENCOUNTER — Other Ambulatory Visit: Payer: Self-pay

## 2021-02-25 ENCOUNTER — Ambulatory Visit
Admission: RE | Admit: 2021-02-25 | Discharge: 2021-02-25 | Disposition: A | Discharge status: Home / Self Care | Payer: Medicare Other | Source: Ambulatory Visit | Attending: Family Medicine | Admitting: Family Medicine

## 2021-02-25 DIAGNOSIS — E2839 Other primary ovarian failure: Secondary | ICD-10-CM

## 2021-03-12 ENCOUNTER — Other Ambulatory Visit: Payer: Self-pay | Admitting: Family Medicine

## 2021-03-12 DIAGNOSIS — E2839 Other primary ovarian failure: Secondary | ICD-10-CM

## 2021-03-14 ENCOUNTER — Other Ambulatory Visit: Payer: Self-pay

## 2021-03-14 ENCOUNTER — Ambulatory Visit: Payer: Medicare Other | Admitting: Podiatry

## 2021-03-14 DIAGNOSIS — B351 Tinea unguium: Secondary | ICD-10-CM

## 2021-03-14 DIAGNOSIS — E1142 Type 2 diabetes mellitus with diabetic polyneuropathy: Secondary | ICD-10-CM

## 2021-03-14 DIAGNOSIS — E785 Hyperlipidemia, unspecified: Secondary | ICD-10-CM | POA: Insufficient documentation

## 2021-03-14 DIAGNOSIS — E1169 Type 2 diabetes mellitus with other specified complication: Secondary | ICD-10-CM | POA: Insufficient documentation

## 2021-03-14 DIAGNOSIS — M79609 Pain in unspecified limb: Secondary | ICD-10-CM

## 2021-03-14 MED ORDER — GABAPENTIN 300 MG PO CAPS: 300.0000 mg | ORAL_CAPSULE | Freq: Every day | ORAL | 3 refills | Status: DC

## 2021-03-16 NOTE — Progress Notes (Signed)
She presents today for a chief complaint of painful elongated toenails states that she has a lot of pain in her feet during the evenings and at nighttime.  States that affecting her ability to perform her daily activities and interfering with her rest..  She has a history of back surgery in 2015 states that the left foot demonstrates worse numbness than that of the right.  Objective: Vital signs are stable alert and oriented x3.  Pulses are palpable.  Neurologic sensorium is slightly diminished per Semmes Weinstein monofilament bilaterally.  Muscle strength is normal symmetrical bilateral.  Cutaneous evaluation.  Well-hydrated cutis sutures no erythema edema/drainage or odor she does have thick yellow dystrophic onychomycotic nails painful palpation as well as debridement.  Assessment: Pain in limb secondary to diabetic peripheral neuropathy and painful elongated mycotic nails.  Plan: At this point I started her on gabapentin 300 mg just to help her at nighttime.  If this helps during the evening then we will consider adding it during the day.  Prescription reads gabapentin 300 mg 1 p.o. nightly #30 with 3 refills also debrided her toenails today 1 through 5 bilateral cover service secondary to pain.  Follow-up with her in 1 month for med check in 3 months for nails.

## 2021-04-16 ENCOUNTER — Ambulatory Visit: Payer: Medicare Other | Admitting: Podiatry

## 2021-05-02 DIAGNOSIS — C652 Malignant neoplasm of left renal pelvis: Secondary | ICD-10-CM | POA: Diagnosis not present

## 2021-05-07 ENCOUNTER — Other Ambulatory Visit: Payer: Self-pay | Admitting: Urology

## 2021-05-07 DIAGNOSIS — C652 Malignant neoplasm of left renal pelvis: Secondary | ICD-10-CM

## 2021-05-09 DIAGNOSIS — C652 Malignant neoplasm of left renal pelvis: Secondary | ICD-10-CM | POA: Diagnosis not present

## 2021-05-10 DIAGNOSIS — Z23 Encounter for immunization: Secondary | ICD-10-CM | POA: Diagnosis not present

## 2021-05-20 DIAGNOSIS — R5381 Other malaise: Secondary | ICD-10-CM | POA: Diagnosis not present

## 2021-05-20 DIAGNOSIS — I10 Essential (primary) hypertension: Secondary | ICD-10-CM | POA: Diagnosis not present

## 2021-05-20 DIAGNOSIS — Z23 Encounter for immunization: Secondary | ICD-10-CM | POA: Diagnosis not present

## 2021-05-20 DIAGNOSIS — E1169 Type 2 diabetes mellitus with other specified complication: Secondary | ICD-10-CM | POA: Diagnosis not present

## 2021-05-20 DIAGNOSIS — M069 Rheumatoid arthritis, unspecified: Secondary | ICD-10-CM | POA: Diagnosis not present

## 2021-05-20 DIAGNOSIS — Z7409 Other reduced mobility: Secondary | ICD-10-CM | POA: Diagnosis not present

## 2021-05-22 DIAGNOSIS — H5203 Hypermetropia, bilateral: Secondary | ICD-10-CM | POA: Diagnosis not present

## 2021-05-22 DIAGNOSIS — H35373 Puckering of macula, bilateral: Secondary | ICD-10-CM | POA: Diagnosis not present

## 2021-05-22 DIAGNOSIS — H02052 Trichiasis without entropian right lower eyelid: Secondary | ICD-10-CM | POA: Diagnosis not present

## 2021-05-22 DIAGNOSIS — H02055 Trichiasis without entropian left lower eyelid: Secondary | ICD-10-CM | POA: Diagnosis not present

## 2021-05-30 ENCOUNTER — Ambulatory Visit
Admission: RE | Admit: 2021-05-30 | Discharge: 2021-05-30 | Disposition: A | Discharge status: Home / Self Care | Payer: Medicare Other | Source: Ambulatory Visit | Attending: Urology | Admitting: Urology

## 2021-05-30 DIAGNOSIS — R319 Hematuria, unspecified: Secondary | ICD-10-CM | POA: Diagnosis not present

## 2021-05-30 DIAGNOSIS — Z85528 Personal history of other malignant neoplasm of kidney: Secondary | ICD-10-CM | POA: Diagnosis not present

## 2021-05-30 DIAGNOSIS — Z905 Acquired absence of kidney: Secondary | ICD-10-CM | POA: Diagnosis not present

## 2021-05-30 DIAGNOSIS — C652 Malignant neoplasm of left renal pelvis: Secondary | ICD-10-CM | POA: Diagnosis not present

## 2021-06-13 ENCOUNTER — Emergency Department (HOSPITAL_COMMUNITY): Payer: Medicare Other

## 2021-06-13 ENCOUNTER — Encounter (HOSPITAL_COMMUNITY): Payer: Self-pay

## 2021-06-13 ENCOUNTER — Inpatient Hospital Stay (HOSPITAL_COMMUNITY)
Admission: EM | Admit: 2021-06-13 | Discharge: 2021-06-19 | DRG: 472 | Disposition: A | Discharge status: Skilled Nursing Facility | Payer: Medicare Other | Attending: Internal Medicine | Admitting: Internal Medicine

## 2021-06-13 DIAGNOSIS — Z9181 History of falling: Secondary | ICD-10-CM | POA: Diagnosis not present

## 2021-06-13 DIAGNOSIS — F332 Major depressive disorder, recurrent severe without psychotic features: Secondary | ICD-10-CM | POA: Diagnosis present

## 2021-06-13 DIAGNOSIS — M1990 Unspecified osteoarthritis, unspecified site: Secondary | ICD-10-CM | POA: Diagnosis present

## 2021-06-13 DIAGNOSIS — Z981 Arthrodesis status: Secondary | ICD-10-CM | POA: Diagnosis not present

## 2021-06-13 DIAGNOSIS — Z881 Allergy status to other antibiotic agents status: Secondary | ICD-10-CM

## 2021-06-13 DIAGNOSIS — R4189 Other symptoms and signs involving cognitive functions and awareness: Secondary | ICD-10-CM | POA: Diagnosis present

## 2021-06-13 DIAGNOSIS — Z833 Family history of diabetes mellitus: Secondary | ICD-10-CM | POA: Diagnosis not present

## 2021-06-13 DIAGNOSIS — Z743 Need for continuous supervision: Secondary | ICD-10-CM | POA: Diagnosis not present

## 2021-06-13 DIAGNOSIS — E1122 Type 2 diabetes mellitus with diabetic chronic kidney disease: Secondary | ICD-10-CM | POA: Diagnosis not present

## 2021-06-13 DIAGNOSIS — M5000 Cervical disc disorder with myelopathy, unspecified cervical region: Secondary | ICD-10-CM | POA: Diagnosis not present

## 2021-06-13 DIAGNOSIS — F333 Major depressive disorder, recurrent, severe with psychotic symptoms: Secondary | ICD-10-CM | POA: Diagnosis not present

## 2021-06-13 DIAGNOSIS — Z90710 Acquired absence of both cervix and uterus: Secondary | ICD-10-CM | POA: Diagnosis not present

## 2021-06-13 DIAGNOSIS — R1312 Dysphagia, oropharyngeal phase: Secondary | ICD-10-CM | POA: Diagnosis not present

## 2021-06-13 DIAGNOSIS — M5021 Other cervical disc displacement,  high cervical region: Secondary | ICD-10-CM | POA: Diagnosis not present

## 2021-06-13 DIAGNOSIS — F419 Anxiety disorder, unspecified: Secondary | ICD-10-CM | POA: Diagnosis present

## 2021-06-13 DIAGNOSIS — Z885 Allergy status to narcotic agent status: Secondary | ICD-10-CM | POA: Diagnosis not present

## 2021-06-13 DIAGNOSIS — G992 Myelopathy in diseases classified elsewhere: Secondary | ICD-10-CM | POA: Diagnosis not present

## 2021-06-13 DIAGNOSIS — E039 Hypothyroidism, unspecified: Secondary | ICD-10-CM | POA: Diagnosis not present

## 2021-06-13 DIAGNOSIS — M6281 Muscle weakness (generalized): Secondary | ICD-10-CM | POA: Diagnosis present

## 2021-06-13 DIAGNOSIS — Z20822 Contact with and (suspected) exposure to covid-19: Secondary | ICD-10-CM | POA: Diagnosis present

## 2021-06-13 DIAGNOSIS — Z888 Allergy status to other drugs, medicaments and biological substances status: Secondary | ICD-10-CM

## 2021-06-13 DIAGNOSIS — Z905 Acquired absence of kidney: Secondary | ICD-10-CM

## 2021-06-13 DIAGNOSIS — G8929 Other chronic pain: Secondary | ICD-10-CM | POA: Diagnosis not present

## 2021-06-13 DIAGNOSIS — Z7401 Bed confinement status: Secondary | ICD-10-CM | POA: Diagnosis not present

## 2021-06-13 DIAGNOSIS — M4322 Fusion of spine, cervical region: Secondary | ICD-10-CM | POA: Diagnosis not present

## 2021-06-13 DIAGNOSIS — I6782 Cerebral ischemia: Secondary | ICD-10-CM | POA: Diagnosis not present

## 2021-06-13 DIAGNOSIS — N1831 Chronic kidney disease, stage 3a: Secondary | ICD-10-CM | POA: Diagnosis not present

## 2021-06-13 DIAGNOSIS — M069 Rheumatoid arthritis, unspecified: Secondary | ICD-10-CM | POA: Diagnosis present

## 2021-06-13 DIAGNOSIS — M48062 Spinal stenosis, lumbar region with neurogenic claudication: Secondary | ICD-10-CM | POA: Diagnosis not present

## 2021-06-13 DIAGNOSIS — R29818 Other symptoms and signs involving the nervous system: Secondary | ICD-10-CM | POA: Diagnosis not present

## 2021-06-13 DIAGNOSIS — M4802 Spinal stenosis, cervical region: Secondary | ICD-10-CM | POA: Diagnosis not present

## 2021-06-13 DIAGNOSIS — N183 Chronic kidney disease, stage 3 unspecified: Secondary | ICD-10-CM | POA: Diagnosis present

## 2021-06-13 DIAGNOSIS — R52 Pain, unspecified: Secondary | ICD-10-CM | POA: Diagnosis not present

## 2021-06-13 DIAGNOSIS — G959 Disease of spinal cord, unspecified: Secondary | ICD-10-CM | POA: Diagnosis not present

## 2021-06-13 DIAGNOSIS — Z91041 Radiographic dye allergy status: Secondary | ICD-10-CM

## 2021-06-13 DIAGNOSIS — G8250 Quadriplegia, unspecified: Secondary | ICD-10-CM | POA: Diagnosis not present

## 2021-06-13 DIAGNOSIS — R200 Anesthesia of skin: Secondary | ICD-10-CM | POA: Diagnosis present

## 2021-06-13 DIAGNOSIS — R0902 Hypoxemia: Secondary | ICD-10-CM | POA: Diagnosis not present

## 2021-06-13 DIAGNOSIS — R531 Weakness: Secondary | ICD-10-CM | POA: Diagnosis not present

## 2021-06-13 DIAGNOSIS — Z7952 Long term (current) use of systemic steroids: Secondary | ICD-10-CM

## 2021-06-13 DIAGNOSIS — R29898 Other symptoms and signs involving the musculoskeletal system: Secondary | ICD-10-CM | POA: Diagnosis not present

## 2021-06-13 DIAGNOSIS — R2681 Unsteadiness on feet: Secondary | ICD-10-CM | POA: Diagnosis not present

## 2021-06-13 DIAGNOSIS — W19XXXA Unspecified fall, initial encounter: Secondary | ICD-10-CM

## 2021-06-13 DIAGNOSIS — I129 Hypertensive chronic kidney disease with stage 1 through stage 4 chronic kidney disease, or unspecified chronic kidney disease: Secondary | ICD-10-CM | POA: Diagnosis not present

## 2021-06-13 DIAGNOSIS — Z79899 Other long term (current) drug therapy: Secondary | ICD-10-CM

## 2021-06-13 DIAGNOSIS — E7800 Pure hypercholesterolemia, unspecified: Secondary | ICD-10-CM | POA: Diagnosis present

## 2021-06-13 DIAGNOSIS — R202 Paresthesia of skin: Secondary | ICD-10-CM | POA: Diagnosis not present

## 2021-06-13 DIAGNOSIS — R262 Difficulty in walking, not elsewhere classified: Secondary | ICD-10-CM | POA: Diagnosis not present

## 2021-06-13 DIAGNOSIS — Z419 Encounter for procedure for purposes other than remedying health state, unspecified: Secondary | ICD-10-CM

## 2021-06-13 DIAGNOSIS — Z7982 Long term (current) use of aspirin: Secondary | ICD-10-CM

## 2021-06-13 DIAGNOSIS — I10 Essential (primary) hypertension: Secondary | ICD-10-CM | POA: Diagnosis not present

## 2021-06-13 LAB — CBC WITH DIFFERENTIAL/PLATELET
Abs Immature Granulocytes: 0.06 10*3/uL (ref 0.00–0.07)
Basophils Absolute: 0.1 10*3/uL (ref 0.0–0.1)
Basophils Relative: 1 %
Eosinophils Absolute: 0.1 10*3/uL (ref 0.0–0.5)
Eosinophils Relative: 1 %
HCT: 38 % (ref 36.0–46.0)
Hemoglobin: 11.9 g/dL — ABNORMAL LOW (ref 12.0–15.0)
Immature Granulocytes: 1 %
Lymphocytes Relative: 6 %
Lymphs Abs: 0.6 10*3/uL — ABNORMAL LOW (ref 0.7–4.0)
MCH: 30.6 pg (ref 26.0–34.0)
MCHC: 31.3 g/dL (ref 30.0–36.0)
MCV: 97.7 fL (ref 80.0–100.0)
Monocytes Absolute: 0.5 10*3/uL (ref 0.1–1.0)
Monocytes Relative: 5 %
Neutro Abs: 8.9 10*3/uL — ABNORMAL HIGH (ref 1.7–7.7)
Neutrophils Relative %: 86 %
Platelets: 166 10*3/uL (ref 150–400)
RBC: 3.89 MIL/uL (ref 3.87–5.11)
RDW: 13.9 % (ref 11.5–15.5)
WBC: 10.2 10*3/uL (ref 4.0–10.5)
nRBC: 0 % (ref 0.0–0.2)

## 2021-06-13 LAB — URINALYSIS, ROUTINE W REFLEX MICROSCOPIC
Bilirubin Urine: NEGATIVE
Glucose, UA: NEGATIVE mg/dL
Hgb urine dipstick: NEGATIVE
Ketones, ur: NEGATIVE mg/dL
Leukocytes,Ua: NEGATIVE
Nitrite: NEGATIVE
Protein, ur: NEGATIVE mg/dL
Specific Gravity, Urine: 1.012 (ref 1.005–1.030)
pH: 6 (ref 5.0–8.0)

## 2021-06-13 LAB — COMPREHENSIVE METABOLIC PANEL
ALT: 19 U/L (ref 0–44)
AST: 29 U/L (ref 15–41)
Albumin: 3.8 g/dL (ref 3.5–5.0)
Alkaline Phosphatase: 100 U/L (ref 38–126)
Anion gap: 9 (ref 5–15)
BUN: 22 mg/dL (ref 8–23)
CO2: 24 mmol/L (ref 22–32)
Calcium: 8.3 mg/dL — ABNORMAL LOW (ref 8.9–10.3)
Chloride: 106 mmol/L (ref 98–111)
Creatinine, Ser: 1.13 mg/dL — ABNORMAL HIGH (ref 0.44–1.00)
GFR, Estimated: 51 mL/min — ABNORMAL LOW (ref >60)
Glucose, Bld: 109 mg/dL — ABNORMAL HIGH (ref 70–99)
Potassium: 4.4 mmol/L (ref 3.5–5.1)
Sodium: 139 mmol/L (ref 135–145)
Total Bilirubin: 0.6 mg/dL (ref 0.3–1.2)
Total Protein: 7 g/dL (ref 6.5–8.1)

## 2021-06-13 LAB — CBG MONITORING, ED: Glucose-Capillary: 113 mg/dL — ABNORMAL HIGH (ref 70–99)

## 2021-06-13 LAB — RESP PANEL BY RT-PCR (FLU A&B, COVID) ARPGX2
Influenza A by PCR: NEGATIVE
Influenza B by PCR: NEGATIVE
SARS Coronavirus 2 by RT PCR: NEGATIVE

## 2021-06-13 LAB — TSH: TSH: 0.358 u[IU]/mL (ref 0.350–4.500)

## 2021-06-13 LAB — AMMONIA: Ammonia: 18 umol/L (ref 9–35)

## 2021-06-13 MED ORDER — LORAZEPAM 2 MG/ML IJ SOLN
2.0000 mg | Freq: Once | INTRAMUSCULAR | Status: AC | PRN
  Administered 2021-06-13: 2 mg via INTRAVENOUS
  Filled 2021-06-13: qty 1

## 2021-06-13 MED ORDER — LORAZEPAM 2 MG/ML IJ SOLN
1.0000 mg | Freq: Once | INTRAMUSCULAR | Status: AC
  Administered 2021-06-13: 1 mg via INTRAVENOUS
  Filled 2021-06-13: qty 1

## 2021-06-13 MED ORDER — LORAZEPAM 2 MG/ML IJ SOLN: 2.0000 mg | Freq: Once | INTRAMUSCULAR | Status: DC

## 2021-06-13 MED ORDER — SODIUM CHLORIDE 0.9 % IV BOLUS
1000.0000 mL | Freq: Once | INTRAVENOUS | Status: AC
  Administered 2021-06-13: 1000 mL via INTRAVENOUS

## 2021-06-13 MED ORDER — LORAZEPAM 2 MG/ML IJ SOLN: 1.0000 mg | Freq: Once | INTRAMUSCULAR | Status: DC

## 2021-06-13 MED ORDER — HALOPERIDOL LACTATE 5 MG/ML IJ SOLN: 5.0000 mg | Freq: Once | INTRAMUSCULAR | Status: DC

## 2021-06-13 MED ORDER — HALOPERIDOL LACTATE 5 MG/ML IJ SOLN
1.0000 mg | Freq: Once | INTRAMUSCULAR | Status: AC | PRN
  Administered 2021-06-13: 1 mg via INTRAVENOUS
  Filled 2021-06-13: qty 1

## 2021-06-13 MED ORDER — HALOPERIDOL LACTATE 5 MG/ML IJ SOLN
5.0000 mg | Freq: Once | INTRAMUSCULAR | Status: AC | PRN
  Administered 2021-06-13: 5 mg via INTRAVENOUS
  Filled 2021-06-13: qty 1

## 2021-06-13 MED ORDER — LORAZEPAM 2 MG/ML IJ SOLN: 0.5000 mg | Freq: Once | INTRAMUSCULAR | Status: DC

## 2021-06-13 MED ORDER — LORAZEPAM 0.5 MG PO TABS: 0.5000 mg | ORAL_TABLET | Freq: Once | ORAL | Status: DC

## 2021-06-13 NOTE — ED Provider Notes (Signed)
[facility] DEPT Provider Note   CSN: [id number] Arrival date & time: 06/13/21  1235     History Chief Complaint  Patient presents with   Lytle Michaels    EDNA REDE is a 75 y.o. female.  Pt presents to the ED today with frequent falls.  The pt said she's been feeling very weak.  She has been unable to ambulate for the past few days.  She also feels like her hands are numb.      Past Medical History:  Diagnosis Date   Anxiety    Arthritis    rhematoid,osteoarthritis   Diabetes mellitus without complication ([place])    Dyspnea    increased exertion   Elevated cholesterol    Hemorrhoids    Hypertension    Hypothyroidism    Joint pain    Nocturia    Sleeping difficulties     Patient Active Problem List   Diagnosis Date Noted   Hyperlipidemia associated with type 2 diabetes mellitus ([place]) 03/14/2021   Acute respiratory failure with hypoxia ([place]) 01/01/2021   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   Cognitive impairment 04/17/2019   Degeneration of lumbar intervertebral disc 10/15/2018   MDD (major depressive disorder), recurrent episode, severe ([place]) 08/10/2018   MDD (major depressive disorder), recurrent, severe, with psychosis ([place])    Altered mental status 05/24/2018   Ketonuria 05/24/2018   Pain in right hand 09/11/2017   Hypertension 03/07/2014   Hyperlipidemia 03/07/2014   Depression 03/07/2014   Spinal stenosis, lumbar region, with neurogenic claudication 02/24/2014   Type 2 diabetes mellitus without complication ([place]) 62/70/3500   Rheumatoid arthritis ([place]) 02/24/2014   Scoliosis of lumbar spine 02/13/2014    Past Surgical History:  Procedure Laterality Date   ABDOMINAL HYSTERECTOMY     BACK SURGERY     CERVICAL FUSION  2004   CHOLECYSTECTOMY     EYE SURGERY Left    cataract   PARS PLANA REPAIR OF RETINAL DEATACHMENT     ROBOT ASSITED LAPAROSCOPIC NEPHROURETERECTOMY Left 12/26/2020   Procedure: XI ROBOT ASSITED LAPAROSCOPIC  LEFT  NEPHROURETERECTOMY/ STENT PLACEMENT/ TRANSURETHRAL RESECTION OF LEFT URETERAL ORIFICE;EXCISION OF SPLENULE;  Surgeon: Ceasar Mons, MD;  Location: [facility];  Service: Urology;  Laterality: Left;   THYROIDECTOMY, PARTIAL       OB History   No obstetric history on file.     Family History  Problem Relation Age of Onset   Diabetes Mother    Breast cancer Mother    Gout Mother    Alzheimer's disease Father    Heart Problems Father    Breast cancer Sister    Alzheimer's disease Sister    Diabetes Sister    Gout Sister    Heart Problems Sister    Alzheimer's disease Brother    Diabetes Brother    Heart Problems Brother     Social History   Tobacco Use   Smoking status: Never   Smokeless tobacco: Never  Vaping Use   Vaping Use: Never used  Substance Use Topics   Alcohol use: No   Drug use: No    Home Medications Prior to Admission medications   Medication Sig Start Date End Date Taking? Authorizing Provider  ACCU-CHEK GUIDE test strip CHECK BLOOD SUGAR ONCE A DAY 10/29/17   [provider]  benztropine (COGENTIN) 0.5 MG tablet Take 0.5 mg by mouth 2 (two) times daily.    [provider]  docusate sodium (COLACE) 100 MG capsule Take 1 capsule (100 mg  total) by mouth 2 (two) times daily. 12/26/20   Debbrah Alar, PA-C  escitalopram (LEXAPRO) 10 MG tablet Take 10 mg by mouth daily.    [provider]  ferrous sulfate 325 (65 FE) MG tablet Take 1 tablet (325 mg total) by mouth daily with breakfast. 12/31/20   Regalado, Belkys A, MD  gabapentin (NEURONTIN) 300 MG capsule Take 1 capsule (300 mg total) by mouth at bedtime. 03/14/21   Hyatt, Max T, DPM  hydroxychloroquine (PLAQUENIL) 200 MG tablet Take 400 mg by mouth daily.    [provider]  leflunomide (ARAVA) 20 MG tablet Take 20 mg by mouth daily.    [provider]  metoprolol succinate (TOPROL-XL) 25 MG 24 hr tablet Take 25 mg by mouth daily.    [provider]   OLANZapine (ZYPREXA) 2.5 MG tablet Take 2.5 mg by mouth daily.    [provider]  polyethylene glycol (MIRALAX / GLYCOLAX) 17 g packet Take 17 g by mouth 2 (two) times daily. 12/31/20   Regalado, Belkys A, MD  predniSONE (DELTASONE) 5 MG tablet Take 5 mg by mouth daily with breakfast.    [provider]  promethazine (PHENERGAN) 12.5 MG tablet Take 1 tablet (12.5 mg total) by mouth every 4 (four) hours as needed for nausea or vomiting. 12/26/20   Debbrah Alar, PA-C    Allergies    Contrast media [iodinated diagnostic agents], Abilify [aripiprazole], Ultram [tramadol hcl], and Zithromax [azithromycin]  Review of Systems   Review of Systems  Neurological:  Positive for weakness and numbness.  All other systems reviewed and are negative.  Physical Exam Updated Vital Signs BP 132/85 (BP Location: Left Arm)   Pulse 86   Temp 98.8 F (37.1 C) (Oral)   Resp 20   Ht 5\' 3"  (1.6 m)   Wt 77.1 kg   SpO2 97%   BMI 30.11 kg/m   Physical Exam Vitals and nursing note reviewed.  Constitutional:      Appearance: Normal appearance. She is obese.  HENT:     Head: Normocephalic and atraumatic.     Right Ear: External ear normal.     Left Ear: External ear normal.     Nose: Nose normal.     Mouth/Throat:     Mouth: Mucous membranes are moist.     Pharynx: Oropharynx is clear.  Eyes:     Extraocular Movements: Extraocular movements intact.     Conjunctiva/sclera: Conjunctivae normal.     Pupils: Pupils are equal, round, and reactive to light.  Cardiovascular:     Rate and Rhythm: Normal rate and regular rhythm.     Pulses: Normal pulses.     Heart sounds: Normal heart sounds.  Pulmonary:     Effort: Pulmonary effort is normal. Tachypnea present.     Breath sounds: Normal breath sounds.  Abdominal:     General: Abdomen is flat. Bowel sounds are normal.     Palpations: Abdomen is soft.  Musculoskeletal:        General: Normal range of motion.     Cervical back:  Normal range of motion and neck supple.  Skin:    General: Skin is warm.     Capillary Refill: Capillary refill takes less than 2 seconds.  Neurological:     General: No focal deficit present.     Mental Status: She is alert and oriented to person, place, and time.  Psychiatric:        Mood and Affect: Mood is  anxious.        Behavior: Behavior normal.   ED Results / Procedures / Treatments   Labs (all labs ordered are listed, but only abnormal results are displayed) Labs Reviewed  CBC WITH DIFFERENTIAL/PLATELET - Abnormal; Notable for the following components:      Result Value   Hemoglobin 11.9 (*)    Neutro Abs 8.9 (*)    Lymphs Abs 0.6 (*)    All other components within normal limits  COMPREHENSIVE METABOLIC PANEL - Abnormal; Notable for the following components:   Glucose, Bld 109 (*)    Creatinine, Ser 1.13 (*)    Calcium 8.3 (*)    GFR, Estimated 51 (*)    All other components within normal limits  CBG MONITORING, ED - Abnormal; Notable for the following components:   Glucose-Capillary 113 (*)    All other components within normal limits  RESP PANEL BY RT-PCR (FLU A&B, COVID) ARPGX2  AMMONIA  TSH  URINALYSIS, ROUTINE W REFLEX MICROSCOPIC    EKG EKG Interpretation  Date/Time:  Thursday June 13 2021 15:05:14 EST Ventricular Rate:  77 PR Interval:  144 QRS Duration: 74 QT Interval:  420 QTC Calculation: 475 R Axis:   10 Text Interpretation: Normal sinus rhythm Nonspecific ST abnormality Abnormal ECG No significant change since last tracing Confirmed by Isla Pence [phone]) on 06/13/2021 3:55:37 PM  Radiology DG Chest 2 View  Result Date: 06/13/2021 CLINICAL DATA:  Altered mental status. Recurrent falls. Generalized weakness. EXAM: CHEST - 2 VIEW COMPARISON:  12/28/2020 FINDINGS: Heart size is normal. Mild tortuosity of the aorta. The lungs are clear. No effusions. Lateral view shows previous spinal fusion with considerable adjacent segment degenerative  changes at the upper margin. IMPRESSION: No active cardiopulmonary disease. Spinal degenerative changes at the upper margin of the spinal fusion. Electronically Signed   By: Nelson Chimes M.D.   On: 06/13/2021 13:29   CT Head Wo Contrast  Result Date: 06/13/2021 CLINICAL DATA:  Altered mental status, multiple falls. EXAM: CT HEAD WITHOUT CONTRAST TECHNIQUE: Contiguous axial images were obtained from the base of the skull through the vertex without intravenous contrast. COMPARISON:  May 24, 2018. FINDINGS: Brain: Mild chronic ischemic white matter disease is noted. No mass effect or midline shift is noted. Ventricular size is within normal limits. There is no evidence of mass lesion, hemorrhage or acute infarction. Vascular: No hyperdense vessel or unexpected calcification. Skull: Normal. Negative for fracture or focal lesion. Sinuses/Orbits: No acute finding. Other: None. IMPRESSION: No acute intracranial abnormality seen. Electronically Signed   By: Marijo Conception M.D.   On: 06/13/2021 15:28    Procedures Procedures   Medications Ordered in ED Medications  LORazepam (ATIVAN) injection 1 mg (1 mg Intravenous Given 06/13/21 1439)  LORazepam (ATIVAN) injection 1 mg (1 mg Intravenous Given 06/13/21 1510)  LORazepam (ATIVAN) injection 1 mg (1 mg Intravenous Given 06/13/21 1630)  sodium chloride 0.9 % bolus 1,000 mL (1,000 mLs Intravenous New Bag/Given 06/13/21 1631)    ED Course  I have reviewed the triage vital signs and the nursing notes.  Pertinent labs & imaging results that were available during my care of the patient were reviewed by me and considered in my medical decision making (see chart for details).    MDM Rules/Calculators/A&P                           Pt is extremely anxious and could not stand CT without anxiety.  She was fine with ativan.  CT ok, so MRI ordered.  Hopefully, with another dose of ativan, she will tolerate the MRI.  Pt signed out to Dr. Matilde Sprang at shift  change.   Final Clinical Impression(s) / ED Diagnoses Final diagnoses:  Fall, initial encounter  Anxiety    Rx / DC Orders ED Discharge Orders     None        Isla Pence, MD 06/13/21 1700

## 2021-06-13 NOTE — ED Notes (Signed)
Patient being transported to MRI

## 2021-06-13 NOTE — ED Provider Notes (Signed)
  Physical Exam  BP (!) 172/68 (BP Location: Right Arm)   Pulse 75   Temp 98.1 F (36.7 C) (Oral)   Resp 16   Ht 5\' 3"  (1.6 m)   Wt 77.1 kg   SpO2 97%   BMI 30.11 kg/m   Physical Exam Vitals and nursing note reviewed.  Constitutional:      General: She is not in acute distress.    Appearance: She is well-developed.  HENT:     Head: Normocephalic and atraumatic.  Eyes:     Conjunctiva/sclera: Conjunctivae normal.  Cardiovascular:     Rate and Rhythm: Normal rate and regular rhythm.     Heart sounds: No murmur heard. Pulmonary:     Effort: Pulmonary effort is normal. No respiratory distress.     Breath sounds: Normal breath sounds.  Abdominal:     Palpations: Abdomen is soft.     Tenderness: There is no abdominal tenderness.  Musculoskeletal:        General: No swelling.     Cervical back: Neck supple.  Skin:    General: Skin is warm and dry.     Capillary Refill: Capillary refill takes less than 2 seconds.  Neurological:     Mental Status: She is alert.     Motor: Weakness (Bilateral upper extremities, bilateral lower extremities) present.  Psychiatric:        Mood and Affect: Mood normal.    ED Course/Procedures     Procedures  MDM  Patient received in handoff.  Concern for multiple falls.  Pending brain MRI and UA.  Patient with significant agitation on brain MRI but no acute pathology.  UA unremarkable.  I spoke with the patient's family and it appears that the patient has actually been complaining of worsening upper and lower extremity weakness numbness and tingling.  Family has concern for spinal cord compression in the setting of the patient's cervical and lumbar fusions.  Thus, we will have to pursue full spine MRI and she will require significant sedation to get this done.  5 mg Haldol and 2 of Ativan were ordered for this scan and I spoke with the patient at length about this.  Patient is on board with this plan.  Patient then signed out to oncoming provider.   Please see provider signout for continuation of work-up.  If MRIs are negative, she will require physical therapy consult in the morning and TOC consult for SNF placement.       Teressa Lower, MD 06/13/21 2352

## 2021-06-13 NOTE — ED Triage Notes (Signed)
Pt arrives via ems. Ems states she has been having recurrent falls and generalized weakness. Pt unable to ambulate over the past several days. Pt is alert and oriented x 4.

## 2021-06-13 NOTE — ED Notes (Signed)
Patient placed in room 23 and put on cardiac monitor

## 2021-06-13 NOTE — ED Notes (Signed)
Patient transported to MRI 

## 2021-06-14 ENCOUNTER — Encounter (HOSPITAL_COMMUNITY): Payer: Self-pay | Admitting: Family Medicine

## 2021-06-14 DIAGNOSIS — G959 Disease of spinal cord, unspecified: Secondary | ICD-10-CM | POA: Diagnosis not present

## 2021-06-14 DIAGNOSIS — N1831 Chronic kidney disease, stage 3a: Secondary | ICD-10-CM

## 2021-06-14 DIAGNOSIS — Z888 Allergy status to other drugs, medicaments and biological substances status: Secondary | ICD-10-CM | POA: Diagnosis not present

## 2021-06-14 DIAGNOSIS — Z833 Family history of diabetes mellitus: Secondary | ICD-10-CM | POA: Diagnosis not present

## 2021-06-14 DIAGNOSIS — G8250 Quadriplegia, unspecified: Secondary | ICD-10-CM | POA: Diagnosis present

## 2021-06-14 DIAGNOSIS — M48062 Spinal stenosis, lumbar region with neurogenic claudication: Secondary | ICD-10-CM

## 2021-06-14 DIAGNOSIS — G992 Myelopathy in diseases classified elsewhere: Secondary | ICD-10-CM | POA: Diagnosis present

## 2021-06-14 DIAGNOSIS — R200 Anesthesia of skin: Secondary | ICD-10-CM | POA: Diagnosis present

## 2021-06-14 DIAGNOSIS — N183 Chronic kidney disease, stage 3 unspecified: Secondary | ICD-10-CM | POA: Diagnosis present

## 2021-06-14 DIAGNOSIS — M5021 Other cervical disc displacement,  high cervical region: Secondary | ICD-10-CM | POA: Diagnosis present

## 2021-06-14 DIAGNOSIS — Z905 Acquired absence of kidney: Secondary | ICD-10-CM | POA: Diagnosis not present

## 2021-06-14 DIAGNOSIS — M1990 Unspecified osteoarthritis, unspecified site: Secondary | ICD-10-CM | POA: Diagnosis present

## 2021-06-14 DIAGNOSIS — F419 Anxiety disorder, unspecified: Secondary | ICD-10-CM | POA: Diagnosis present

## 2021-06-14 DIAGNOSIS — Z881 Allergy status to other antibiotic agents status: Secondary | ICD-10-CM | POA: Diagnosis not present

## 2021-06-14 DIAGNOSIS — Z20822 Contact with and (suspected) exposure to covid-19: Secondary | ICD-10-CM | POA: Diagnosis present

## 2021-06-14 DIAGNOSIS — M6281 Muscle weakness (generalized): Secondary | ICD-10-CM | POA: Diagnosis present

## 2021-06-14 DIAGNOSIS — E039 Hypothyroidism, unspecified: Secondary | ICD-10-CM | POA: Diagnosis present

## 2021-06-14 DIAGNOSIS — M4802 Spinal stenosis, cervical region: Secondary | ICD-10-CM | POA: Diagnosis present

## 2021-06-14 DIAGNOSIS — F333 Major depressive disorder, recurrent, severe with psychotic symptoms: Secondary | ICD-10-CM

## 2021-06-14 DIAGNOSIS — Z885 Allergy status to narcotic agent status: Secondary | ICD-10-CM | POA: Diagnosis not present

## 2021-06-14 DIAGNOSIS — E7800 Pure hypercholesterolemia, unspecified: Secondary | ICD-10-CM | POA: Diagnosis present

## 2021-06-14 DIAGNOSIS — Z981 Arthrodesis status: Secondary | ICD-10-CM | POA: Diagnosis not present

## 2021-06-14 DIAGNOSIS — I129 Hypertensive chronic kidney disease with stage 1 through stage 4 chronic kidney disease, or unspecified chronic kidney disease: Secondary | ICD-10-CM | POA: Diagnosis present

## 2021-06-14 DIAGNOSIS — Z9181 History of falling: Secondary | ICD-10-CM | POA: Diagnosis not present

## 2021-06-14 DIAGNOSIS — M069 Rheumatoid arthritis, unspecified: Secondary | ICD-10-CM

## 2021-06-14 DIAGNOSIS — Z90710 Acquired absence of both cervix and uterus: Secondary | ICD-10-CM | POA: Diagnosis not present

## 2021-06-14 DIAGNOSIS — E1122 Type 2 diabetes mellitus with diabetic chronic kidney disease: Secondary | ICD-10-CM | POA: Diagnosis present

## 2021-06-14 DIAGNOSIS — R4189 Other symptoms and signs involving cognitive functions and awareness: Secondary | ICD-10-CM | POA: Diagnosis present

## 2021-06-14 DIAGNOSIS — F332 Major depressive disorder, recurrent severe without psychotic features: Secondary | ICD-10-CM | POA: Diagnosis present

## 2021-06-14 LAB — BASIC METABOLIC PANEL
Anion gap: 7 (ref 5–15)
BUN: 18 mg/dL (ref 8–23)
CO2: 24 mmol/L (ref 22–32)
Calcium: 7.9 mg/dL — ABNORMAL LOW (ref 8.9–10.3)
Chloride: 107 mmol/L (ref 98–111)
Creatinine, Ser: 0.99 mg/dL (ref 0.44–1.00)
GFR, Estimated: 59 mL/min — ABNORMAL LOW (ref >60)
Glucose, Bld: 84 mg/dL (ref 70–99)
Potassium: 3.6 mmol/L (ref 3.5–5.1)
Sodium: 138 mmol/L (ref 135–145)

## 2021-06-14 LAB — CBC
HCT: 36.4 % (ref 36.0–46.0)
Hemoglobin: 11.5 g/dL — ABNORMAL LOW (ref 12.0–15.0)
MCH: 31.2 pg (ref 26.0–34.0)
MCHC: 31.6 g/dL (ref 30.0–36.0)
MCV: 98.6 fL (ref 80.0–100.0)
Platelets: 141 10*3/uL — ABNORMAL LOW (ref 150–400)
RBC: 3.69 MIL/uL — ABNORMAL LOW (ref 3.87–5.11)
RDW: 13.6 % (ref 11.5–15.5)
WBC: 7.7 10*3/uL (ref 4.0–10.5)
nRBC: 0 % (ref 0.0–0.2)

## 2021-06-14 LAB — GLUCOSE, CAPILLARY: Glucose-Capillary: 76 mg/dL (ref 70–99)

## 2021-06-14 LAB — HEMOGLOBIN A1C
Hgb A1c MFr Bld: 5.4 % (ref 4.8–5.6)
Mean Plasma Glucose: 108.28 mg/dL

## 2021-06-14 MED ORDER — BENZTROPINE MESYLATE 0.5 MG PO TABS
0.5000 mg | ORAL_TABLET | Freq: Two times a day (BID) | ORAL | Status: DC
  Administered 2021-06-14 – 2021-06-19 (×11): 0.5 mg via ORAL
  Filled 2021-06-14 (×12): qty 1

## 2021-06-14 MED ORDER — DOCUSATE SODIUM 100 MG PO CAPS: 100.0000 mg | ORAL_CAPSULE | Freq: Two times a day (BID) | ORAL | Status: DC

## 2021-06-14 MED ORDER — ESCITALOPRAM OXALATE 10 MG PO TABS
10.0000 mg | ORAL_TABLET | Freq: Every day | ORAL | Status: DC
Start: 2021-06-14 — End: 2021-06-14

## 2021-06-14 MED ORDER — DOCUSATE SODIUM 100 MG PO CAPS
100.0000 mg | ORAL_CAPSULE | Freq: Two times a day (BID) | ORAL | Status: DC
  Administered 2021-06-15 – 2021-06-18 (×6): 100 mg via ORAL
  Filled 2021-06-14 (×7): qty 1

## 2021-06-14 MED ORDER — HYDROMORPHONE HCL 1 MG/ML IJ SOLN
0.5000 mg | INTRAMUSCULAR | Status: DC | PRN
  Administered 2021-06-14 – 2021-06-19 (×6): 0.5 mg via INTRAVENOUS
  Filled 2021-06-14 (×4): qty 0.5
  Filled 2021-06-14: qty 1
  Filled 2021-06-14 (×2): qty 0.5

## 2021-06-14 MED ORDER — METOPROLOL SUCCINATE ER 50 MG PO TB24
25.0000 mg | ORAL_TABLET | Freq: Every day | ORAL | Status: DC
Start: 2021-06-14 — End: 2021-06-14

## 2021-06-14 MED ORDER — METOPROLOL SUCCINATE ER 25 MG PO TB24
25.0000 mg | ORAL_TABLET | Freq: Every day | ORAL | Status: DC
  Administered 2021-06-17 – 2021-06-19 (×3): 25 mg via ORAL
  Filled 2021-06-14 (×4): qty 1

## 2021-06-14 MED ORDER — PREDNISONE 5 MG PO TABS
5.0000 mg | ORAL_TABLET | Freq: Every day | ORAL | Status: DC
  Filled 2021-06-14: qty 1

## 2021-06-14 MED ORDER — OLANZAPINE 2.5 MG PO TABS
2.5000 mg | ORAL_TABLET | Freq: Every day | ORAL | Status: DC
  Administered 2021-06-14 – 2021-06-18 (×5): 2.5 mg via ORAL
  Filled 2021-06-14 (×5): qty 1

## 2021-06-14 MED ORDER — POLYETHYLENE GLYCOL 3350 17 G PO PACK
17.0000 g | PACK | Freq: Every day | ORAL | Status: DC
  Administered 2021-06-14 – 2021-06-17 (×3): 17 g via ORAL
  Filled 2021-06-14 (×6): qty 1

## 2021-06-14 MED ORDER — ACETAMINOPHEN 325 MG PO TABS
650.0000 mg | ORAL_TABLET | Freq: Four times a day (QID) | ORAL | Status: DC | PRN
  Administered 2021-06-17 – 2021-06-19 (×2): 650 mg via ORAL
  Filled 2021-06-14 (×3): qty 2

## 2021-06-14 MED ORDER — ACETAMINOPHEN 650 MG RE SUPP: 650.0000 mg | Freq: Four times a day (QID) | RECTAL | Status: DC | PRN

## 2021-06-14 MED ORDER — INSULIN ASPART 100 UNIT/ML IJ SOLN
0.0000 [IU] | Freq: Three times a day (TID) | INTRAMUSCULAR | Status: DC
  Administered 2021-06-15 (×2): 2 [IU] via SUBCUTANEOUS
  Administered 2021-06-16 – 2021-06-19 (×2): 1 [IU] via SUBCUTANEOUS

## 2021-06-14 MED ORDER — SODIUM CHLORIDE 0.9 % IV SOLN: INTRAVENOUS | Status: AC

## 2021-06-14 MED ORDER — ESCITALOPRAM OXALATE 10 MG PO TABS
10.0000 mg | ORAL_TABLET | Freq: Every day | ORAL | Status: DC
  Administered 2021-06-15 – 2021-06-19 (×5): 10 mg via ORAL
  Filled 2021-06-14 (×5): qty 1

## 2021-06-14 MED ORDER — HYDROXYCHLOROQUINE SULFATE 200 MG PO TABS: 200.0000 mg | ORAL_TABLET | Freq: Two times a day (BID) | ORAL | Status: DC

## 2021-06-14 MED ORDER — OLANZAPINE 5 MG PO TABS: 2.5000 mg | ORAL_TABLET | Freq: Every day | ORAL | Status: DC

## 2021-06-14 MED ORDER — LEFLUNOMIDE 20 MG PO TABS: 20.0000 mg | ORAL_TABLET | Freq: Every day | ORAL | Status: DC

## 2021-06-14 MED ORDER — HALOPERIDOL LACTATE 5 MG/ML IJ SOLN
2.0000 mg | Freq: Four times a day (QID) | INTRAMUSCULAR | Status: DC | PRN
  Administered 2021-06-14: 2 mg via INTRAVENOUS
  Filled 2021-06-14: qty 1

## 2021-06-14 MED ORDER — SENNOSIDES-DOCUSATE SODIUM 8.6-50 MG PO TABS: 1.0000 | ORAL_TABLET | Freq: Every evening | ORAL | Status: DC | PRN

## 2021-06-14 MED ORDER — LEFLUNOMIDE 20 MG PO TABS
20.0000 mg | ORAL_TABLET | Freq: Every day | ORAL | Status: DC
  Administered 2021-06-15 – 2021-06-19 (×5): 20 mg via ORAL
  Filled 2021-06-14 (×5): qty 1

## 2021-06-14 MED ORDER — PREDNISONE 5 MG PO TABS
5.0000 mg | ORAL_TABLET | Freq: Every day | ORAL | Status: DC
  Administered 2021-06-16 – 2021-06-19 (×4): 5 mg via ORAL
  Filled 2021-06-14 (×5): qty 1

## 2021-06-14 MED ORDER — HYDROXYCHLOROQUINE SULFATE 200 MG PO TABS
200.0000 mg | ORAL_TABLET | Freq: Two times a day (BID) | ORAL | Status: DC
  Administered 2021-06-15 – 2021-06-19 (×9): 200 mg via ORAL
  Filled 2021-06-14 (×10): qty 1

## 2021-06-14 NOTE — H&P (Signed)
History and Physical    [name]:[id number] DOB: 01-Dec-1945 DOA: 06/13/2021  PCP: Aretta Nip, MD   Patient coming from: ILF   Chief Complaint: Difficulty ambulating, falls, upper and lower extremity numbness   HPI: [name] is a pleasant 75 y.o. female with medical history significant for depression, anxiety, cognitive impairment, hypertension, rheumatoid arthritis, and diabetes mellitus, now presenting to the emergency department for evaluation of extremity numbness, difficulty ambulating, and recurrent falls.  Patient has received multiple doses of IV Ativan and Haldol in the ED in order to facilitate MRI and is unable to contribute to the history at time of admission.  Per discussion with ED personnel and family, the patient has had several days of difficulty ambulating with recurrent falls and has complained of numbness and tingling in her bilateral hands and also possibly her lower extremities.  ED Course: Upon arrival to the ED, patient is found to be afebrile, saturating well on room air, and with stable blood pressure.  EKG features sinus rhythm and chest x-ray negative for acute cardiopulmonary disease.  Head CT negative for acute intracranial abnormality.  MRI brain is motion degraded but no acute infarct identified.  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  Neurosurgery was consulted by ED physician, a liter of saline was administered, and hospitalists asked to admit.  Review of Systems:  ROS limited by the patient's clinical condition.  Past Medical History:  Diagnosis Date   Anxiety    Arthritis    rhematoid,osteoarthritis   Diabetes mellitus without complication ([place])    Dyspnea    increased exertion   Elevated  cholesterol    Hemorrhoids    Hypertension    Hypothyroidism    Joint pain    Nocturia    Sleeping difficulties     Past Surgical History:  Procedure Laterality Date   ABDOMINAL HYSTERECTOMY     BACK SURGERY     CERVICAL FUSION  2004   CHOLECYSTECTOMY     EYE SURGERY Left    cataract   PARS PLANA REPAIR OF RETINAL DEATACHMENT     ROBOT ASSITED LAPAROSCOPIC NEPHROURETERECTOMY Left 12/26/2020   Procedure: XI ROBOT ASSITED LAPAROSCOPIC  LEFT NEPHROURETERECTOMY/ STENT PLACEMENT/ TRANSURETHRAL RESECTION OF LEFT URETERAL ORIFICE;EXCISION OF SPLENULE;  Surgeon: Ceasar Mons, MD;  Location: [facility];  Service: Urology;  Laterality: Left;   THYROIDECTOMY, PARTIAL      Social History:   reports that she has never smoked. She has never used smokeless tobacco. She reports that she does not drink alcohol and does not use drugs.  Allergies  Allergen Reactions   Contrast Media [Iodinated Diagnostic Agents] Anaphylaxis, Hives and Other (See Comments)    Only the "retina dye"   Abilify [Aripiprazole] Other (See Comments)    Pt does not recall reaction   Ultram [Tramadol Hcl] Other (See Comments)    Reaction unknown   Zithromax [Azithromycin] Anxiety and Other (See Comments)    Heightens anxiety    Family History  Problem Relation Age of Onset   Diabetes Mother    Breast cancer Mother    Gout Mother    Alzheimer's disease Father    Heart Problems Father    Breast cancer Sister    Alzheimer's disease Sister    Diabetes Sister  Gout Sister    Heart Problems Sister    Alzheimer's disease Brother    Diabetes Brother    Heart Problems Brother      Prior to Admission medications   Medication Sig Start Date End Date Taking? Authorizing Provider  ACCU-CHEK GUIDE test strip CHECK BLOOD SUGAR ONCE A DAY 10/29/17   [provider]  benztropine (COGENTIN) 0.5 MG tablet Take 0.5 mg by mouth 2 (two) times daily.    [provider]  docusate sodium (COLACE) 100  MG capsule Take 1 capsule (100 mg total) by mouth 2 (two) times daily. 12/26/20   Debbrah Alar, PA-C  escitalopram (LEXAPRO) 10 MG tablet Take 10 mg by mouth daily.    [provider]  ferrous sulfate 325 (65 FE) MG tablet Take 1 tablet (325 mg total) by mouth daily with breakfast. 12/31/20   Regalado, Belkys A, MD  gabapentin (NEURONTIN) 300 MG capsule Take 1 capsule (300 mg total) by mouth at bedtime. 03/14/21   Hyatt, Max T, DPM  hydroxychloroquine (PLAQUENIL) 200 MG tablet Take 400 mg by mouth daily.    [provider]  leflunomide (ARAVA) 20 MG tablet Take 20 mg by mouth daily.    [provider]  metoprolol succinate (TOPROL-XL) 25 MG 24 hr tablet Take 25 mg by mouth daily.    [provider]  OLANZapine (ZYPREXA) 2.5 MG tablet Take 2.5 mg by mouth daily.    [provider]  polyethylene glycol (MIRALAX / GLYCOLAX) 17 g packet Take 17 g by mouth 2 (two) times daily. 12/31/20   Regalado, Belkys A, MD  predniSONE (DELTASONE) 5 MG tablet Take 5 mg by mouth daily with breakfast.    [provider]  promethazine (PHENERGAN) 12.5 MG tablet Take 1 tablet (12.5 mg total) by mouth every 4 (four) hours as needed for nausea or vomiting. 12/26/20   Debbrah Alar, PA-C    Physical Exam: Vitals:   06/14/21 0057 06/14/21 0100 06/14/21 0130 06/14/21 0200  BP: (!) 143/68 123/69 (!) 106/59 (!) 104/55  Pulse: 69 65 62 (!) 59  Resp: 20 (!) 21 20 19   Temp:      TempSrc:      SpO2: 96% 95% 96% 97%  Weight:      Height:        Constitutional: NAD, heavily sedated, opens eyes briefly to loud voice   Eyes: PERTLA, lids and conjunctivae normal ENMT: Mucous membranes are moist. Posterior pharynx clear of any exudate or lesions.   Neck: supple, no masses  Respiratory: no wheezing, no crackles. No accessory muscle use.  Cardiovascular: S1 & S2 heard, regular rate and rhythm. No extremity edema.   Abdomen: No distension, no tenderness, soft. Bowel sounds  active.  Musculoskeletal: no clubbing / cyanosis. No joint deformity upper and lower extremities.   Skin: no significant rashes, lesions, ulcers. Warm, dry, well-perfused. Neurologic: CN 2-12 grossly intact. Heavily sedated. Opens eyes briefly to loud voice. Not following commands.    Labs and Imaging on Admission: I have personally reviewed following labs and imaging studies  CBC: Recent Labs  Lab 06/13/21 1304  WBC 10.2  NEUTROABS 8.9*  HGB 11.9*  HCT 38.0  MCV 97.7  PLT 315   Basic Metabolic Panel: Recent Labs  Lab 06/13/21 1304  NA 139  K 4.4  CL 106  CO2 24  GLUCOSE 109*  BUN 22  CREATININE 1.13*  CALCIUM 8.3*   GFR: Estimated Creatinine Clearance: 42.3 mL/min (A) (by C-G formula  based on SCr of 1.13 mg/dL (H)). Liver Function Tests: Recent Labs  Lab 06/13/21 1304  AST 29  ALT 19  ALKPHOS 100  BILITOT 0.6  PROT 7.0  ALBUMIN 3.8   No results for input(s): LIPASE, AMYLASE in the last 168 hours. Recent Labs  Lab 06/13/21 1420  AMMONIA 18   Coagulation Profile: No results for input(s): INR, PROTIME in the last 168 hours. Cardiac Enzymes: No results for input(s): CKTOTAL, CKMB, CKMBINDEX, TROPONINI in the last 168 hours. BNP (last 3 results) No results for input(s): PROBNP in the last 8760 hours. HbA1C: No results for input(s): HGBA1C in the last 72 hours. CBG: Recent Labs  Lab 06/13/21 1437  GLUCAP 113*   Lipid Profile: No results for input(s): CHOL, HDL, LDLCALC, TRIG, CHOLHDL, LDLDIRECT in the last 72 hours. Thyroid Function Tests: Recent Labs    06/13/21 1315  TSH 0.358   Anemia Panel: No results for input(s): VITAMINB12, FOLATE, FERRITIN, TIBC, IRON, RETICCTPCT in the last 72 hours. Urine analysis:    Component Value Date/Time   COLORURINE YELLOW 06/13/2021 2049   APPEARANCEUR CLEAR 06/13/2021 2049   LABSPEC 1.012 06/13/2021 2049   PHURINE 6.0 06/13/2021 2049   GLUCOSEU NEGATIVE 06/13/2021 2049   HGBUR NEGATIVE 06/13/2021 2049    [place] NEGATIVE 06/13/2021 2049   KETONESUR NEGATIVE 06/13/2021 2049   PROTEINUR NEGATIVE 06/13/2021 2049   UROBILINOGEN 0.2 02/01/2011 1327   NITRITE NEGATIVE 06/13/2021 2049   LEUKOCYTESUR NEGATIVE 06/13/2021 2049   Sepsis Labs: @LABRCNTIP (procalcitonin:4,lacticidven:4) ) Recent Results (from the past 240 hour(s))  Resp Panel by RT-PCR (Flu A&B, Covid) Nasopharyngeal Swab     Status: None   Collection Time: 06/13/21  2:27 PM   Specimen: Nasopharyngeal Swab; Nasopharyngeal(NP) swabs in vial transport medium  Result Value Ref Range Status   SARS Coronavirus 2 by RT PCR NEGATIVE NEGATIVE Final    Comment: (NOTE) SARS-CoV-2 target nucleic acids are NOT DETECTED.  The SARS-CoV-2 RNA is generally detectable in upper respiratory specimens during the acute phase of infection. The lowest concentration of SARS-CoV-2 viral copies this assay can detect is 138 copies/mL. A negative result does not preclude SARS-Cov-2 infection and should not be used as the sole basis for treatment or other patient management decisions. A negative result may occur with  improper specimen collection/handling, submission of specimen other than nasopharyngeal swab, presence of viral mutation(s) within the areas targeted by this assay, and inadequate number of viral copies(<138 copies/mL). A negative result must be combined with clinical observations, patient history, and epidemiological information. The expected result is Negative.  Fact Sheet for Patients:  EntrepreneurPulse.com.au  Fact Sheet for Healthcare Providers:  IncredibleEmployment.be  This test is no t yet approved or cleared by the [place] FDA and  has been authorized for detection and/or diagnosis of SARS-CoV-2 by FDA under an Emergency Use Authorization (EUA). This EUA will remain  in effect (meaning this test can be used) for the duration of the COVID-19 declaration under Section 564(b)(1) of the  Act, 21 U.S.C.section 360bbb-3(b)(1), unless the authorization is terminated  or revoked sooner.       Influenza A by PCR NEGATIVE NEGATIVE Final   Influenza B by PCR NEGATIVE NEGATIVE Final    Comment: (NOTE) The Xpert Xpress SARS-CoV-2/FLU/RSV plus assay is intended as an aid in the diagnosis of influenza from Nasopharyngeal swab specimens and should not be used as a sole basis for treatment. Nasal washings and aspirates are unacceptable for Xpert Xpress SARS-CoV-2/FLU/RSV testing.  Fact Sheet for  Patients: EntrepreneurPulse.com.au  Fact Sheet for Healthcare Providers: IncredibleEmployment.be  This test is not yet approved or cleared by the [place] FDA and has been authorized for detection and/or diagnosis of SARS-CoV-2 by FDA under an Emergency Use Authorization (EUA). This EUA will remain in effect (meaning this test can be used) for the duration of the COVID-19 declaration under Section 564(b)(1) of the Act, 21 U.S.C. section 360bbb-3(b)(1), unless the authorization is terminated or revoked.  Performed at [facility], [place] [address] [place], [place] [address]      Radiological Exams on Admission: DG Chest 2 View  Result Date: 06/13/2021 CLINICAL DATA:  Altered mental status. Recurrent falls. Generalized weakness. EXAM: CHEST - 2 VIEW COMPARISON:  12/28/2020 FINDINGS: Heart size is normal. Mild tortuosity of the aorta. The lungs are clear. No effusions. Lateral view shows previous spinal fusion with considerable adjacent segment degenerative changes at the upper margin. IMPRESSION: No active cardiopulmonary disease. Spinal degenerative changes at the upper margin of the spinal fusion. Electronically Signed   By: Nelson Chimes M.D.   On: 06/13/2021 13:29   CT Head Wo Contrast  Result Date: 06/13/2021 CLINICAL DATA:  Altered mental status, multiple falls. EXAM: CT HEAD WITHOUT CONTRAST TECHNIQUE: Contiguous axial  images were obtained from the base of the skull through the vertex without intravenous contrast. COMPARISON:  May 24, 2018. FINDINGS: Brain: Mild chronic ischemic white matter disease is noted. No mass effect or midline shift is noted. Ventricular size is within normal limits. There is no evidence of mass lesion, hemorrhage or acute infarction. Vascular: No hyperdense vessel or unexpected calcification. Skull: Normal. Negative for fracture or focal lesion. Sinuses/Orbits: No acute finding. Other: None. IMPRESSION: No acute intracranial abnormality seen. Electronically Signed   By: Marijo Conception M.D.   On: 06/13/2021 15:28   MR BRAIN WO CONTRAST  Result Date: 06/13/2021 CLINICAL DATA:  Acute neuro deficit. EXAM: MRI HEAD WITHOUT CONTRAST TECHNIQUE: Multiplanar, multiecho pulse sequences of the brain and surrounding structures were obtained without intravenous contrast. COMPARISON:  CT head 06/13/2021 FINDINGS: Brain: Motion degraded study.  Allowing for this, no acute infarct Moderate chronic microvascular ischemic change in the white matter. Negative for hydrocephalus or mass lesion. No hemorrhage identified Vascular: Normal arterial flow voids Skull and upper cervical spine: Negative Sinuses/Orbits: Negative Other: None IMPRESSION: Image quality degraded by significant by motion Negative for acute infarct. Moderate chronic microvascular ischemic change in the white matter. Electronically Signed   By: Franchot Gallo M.D.   On: 06/13/2021 18:49   MR Cervical Spine Wo Contrast  Result Date: 06/14/2021 CLINICAL DATA:  Upper and lower extremity weakness EXAM: MRI CERVICAL, THORACIC AND LUMBAR SPINE WITHOUT CONTRAST TECHNIQUE: Multiplanar and multiecho pulse sequences of the cervical spine, to include the craniocervical junction and cervicothoracic junction, and thoracic and lumbar spine, were obtained without intravenous contrast. COMPARISON:  None. FINDINGS: MRI CERVICAL SPINE FINDINGS Alignment:  Physiologic. Vertebrae: C4-7 ACDF Cord: Mildly increased T2-weighted signal within the spinal cord at the C3 level Posterior Fossa, vertebral arteries, paraspinal tissues: Negative Disc levels: At C3-4, there is a large central disc extrusion with superior migration causing severe spinal canal stenosis and compressing the spinal cord. There is no other cervical spinal canal stenosis or advance neural foraminal stenosis. MRI THORACIC SPINE FINDINGS Alignment:  Physiologic. Vertebrae: No fracture, evidence of discitis, or bone lesion. Cord:  Normal signal and morphology. Paraspinal and other soft tissues: Negative. Disc levels: T8-9 small central disc protrusion without stenosis. The other  thoracic levels are unremarkable. MRI LUMBAR SPINE FINDINGS Segmentation:  Standard. Alignment:  Physiologic. Vertebrae: L2-5 PLIF with posterior decompression. No acute abnormality. Chronic height loss of L1. Conus medullaris and cauda equina: Conus extends to the L1 level. Paraspinal and other soft tissues: Left kidney is absent. Disc levels: T12-L1: Small left subarticular disc protrusion without spinal canal or neural foraminal stenosis. L1-L2: Large right subarticular disc protrusion causing severe narrowing of the right lateral recess and severe central spinal canal stenosis. This has worsened compared to 11/02/2018. No neural foraminal stenosis. L2-L3: Posterior fusion and decompression. No spinal canal stenosis. No neural foraminal stenosis. L3-L4: Posterior fusion and decompression. No spinal canal stenosis. No neural foraminal stenosis. L4-L5: Posterior fusion decompression. No spinal canal stenosis. No neural foraminal stenosis. L5-S1: Large central disc extrusion with inferior migration causing mass effect on the left S1 nerve root in the lateral recess. No central spinal canal stenosis. Severe bilateral neural foraminal stenosis. Visualized sacrum: Normal. IMPRESSION: 1. Large central disc extrusion at C3-4 causing  severe spinal canal stenosis and compressing the spinal cord. Mildly increased T2-weighted signal within the spinal cord consistent with compressive myelopathy. 2. Large right subarticular disc protrusion at L1-L2 causing severe right lateral recess and severe central spinal canal stenosis. This has worsened compared to 04/21/202020. 3. Large central disc extrusion at L5-S1 with inferior migration causing mass effect on the left S1 nerve root in the lateral recess. Severe bilateral L5-S1 neural foraminal stenosis. Critical Value/emergent results were called by telephone at the time of interpretation on 06/14/2021 at 1:16 am to Dr. Florina Ou, Who verbally acknowledged these results. Electronically Signed   By: Ulyses Jarred M.D.   On: 06/14/2021 01:16   MR THORACIC SPINE WO CONTRAST  Result Date: 06/14/2021 CLINICAL DATA:  Upper and lower extremity weakness EXAM: MRI CERVICAL, THORACIC AND LUMBAR SPINE WITHOUT CONTRAST TECHNIQUE: Multiplanar and multiecho pulse sequences of the cervical spine, to include the craniocervical junction and cervicothoracic junction, and thoracic and lumbar spine, were obtained without intravenous contrast. COMPARISON:  None. FINDINGS: MRI CERVICAL SPINE FINDINGS Alignment: Physiologic. Vertebrae: C4-7 ACDF Cord: Mildly increased T2-weighted signal within the spinal cord at the C3 level Posterior Fossa, vertebral arteries, paraspinal tissues: Negative Disc levels: At C3-4, there is a large central disc extrusion with superior migration causing severe spinal canal stenosis and compressing the spinal cord. There is no other cervical spinal canal stenosis or advance neural foraminal stenosis. MRI THORACIC SPINE FINDINGS Alignment:  Physiologic. Vertebrae: No fracture, evidence of discitis, or bone lesion. Cord:  Normal signal and morphology. Paraspinal and other soft tissues: Negative. Disc levels: T8-9 small central disc protrusion without stenosis. The other thoracic levels are  unremarkable. MRI LUMBAR SPINE FINDINGS Segmentation:  Standard. Alignment:  Physiologic. Vertebrae: L2-5 PLIF with posterior decompression. No acute abnormality. Chronic height loss of L1. Conus medullaris and cauda equina: Conus extends to the L1 level. Paraspinal and other soft tissues: Left kidney is absent. Disc levels: T12-L1: Small left subarticular disc protrusion without spinal canal or neural foraminal stenosis. L1-L2: Large right subarticular disc protrusion causing severe narrowing of the right lateral recess and severe central spinal canal stenosis. This has worsened compared to 11/02/2018. No neural foraminal stenosis. L2-L3: Posterior fusion and decompression. No spinal canal stenosis. No neural foraminal stenosis. L3-L4: Posterior fusion and decompression. No spinal canal stenosis. No neural foraminal stenosis. L4-L5: Posterior fusion decompression. No spinal canal stenosis. No neural foraminal stenosis. L5-S1: Large central disc extrusion with inferior migration causing mass effect on the left  S1 nerve root in the lateral recess. No central spinal canal stenosis. Severe bilateral neural foraminal stenosis. Visualized sacrum: Normal. IMPRESSION: 1. Large central disc extrusion at C3-4 causing severe spinal canal stenosis and compressing the spinal cord. Mildly increased T2-weighted signal within the spinal cord consistent with compressive myelopathy. 2. Large right subarticular disc protrusion at L1-L2 causing severe right lateral recess and severe central spinal canal stenosis. This has worsened compared to 04/21/202020. 3. Large central disc extrusion at L5-S1 with inferior migration causing mass effect on the left S1 nerve root in the lateral recess. Severe bilateral L5-S1 neural foraminal stenosis. Critical Value/emergent results were called by telephone at the time of interpretation on 06/14/2021 at 1:16 am to Dr. Florina Ou, Who verbally acknowledged these results. Electronically Signed   By: Ulyses Jarred M.D.   On: 06/14/2021 01:16   MR LUMBAR SPINE WO CONTRAST  Result Date: 06/14/2021 CLINICAL DATA:  Upper and lower extremity weakness EXAM: MRI CERVICAL, THORACIC AND LUMBAR SPINE WITHOUT CONTRAST TECHNIQUE: Multiplanar and multiecho pulse sequences of the cervical spine, to include the craniocervical junction and cervicothoracic junction, and thoracic and lumbar spine, were obtained without intravenous contrast. COMPARISON:  None. FINDINGS: MRI CERVICAL SPINE FINDINGS Alignment: Physiologic. Vertebrae: C4-7 ACDF Cord: Mildly increased T2-weighted signal within the spinal cord at the C3 level Posterior Fossa, vertebral arteries, paraspinal tissues: Negative Disc levels: At C3-4, there is a large central disc extrusion with superior migration causing severe spinal canal stenosis and compressing the spinal cord. There is no other cervical spinal canal stenosis or advance neural foraminal stenosis. MRI THORACIC SPINE FINDINGS Alignment:  Physiologic. Vertebrae: No fracture, evidence of discitis, or bone lesion. Cord:  Normal signal and morphology. Paraspinal and other soft tissues: Negative. Disc levels: T8-9 small central disc protrusion without stenosis. The other thoracic levels are unremarkable. MRI LUMBAR SPINE FINDINGS Segmentation:  Standard. Alignment:  Physiologic. Vertebrae: L2-5 PLIF with posterior decompression. No acute abnormality. Chronic height loss of L1. Conus medullaris and cauda equina: Conus extends to the L1 level. Paraspinal and other soft tissues: Left kidney is absent. Disc levels: T12-L1: Small left subarticular disc protrusion without spinal canal or neural foraminal stenosis. L1-L2: Large right subarticular disc protrusion causing severe narrowing of the right lateral recess and severe central spinal canal stenosis. This has worsened compared to 11/02/2018. No neural foraminal stenosis. L2-L3: Posterior fusion and decompression. No spinal canal stenosis. No neural foraminal  stenosis. L3-L4: Posterior fusion and decompression. No spinal canal stenosis. No neural foraminal stenosis. L4-L5: Posterior fusion decompression. No spinal canal stenosis. No neural foraminal stenosis. L5-S1: Large central disc extrusion with inferior migration causing mass effect on the left S1 nerve root in the lateral recess. No central spinal canal stenosis. Severe bilateral neural foraminal stenosis. Visualized sacrum: Normal. IMPRESSION: 1. Large central disc extrusion at C3-4 causing severe spinal canal stenosis and compressing the spinal cord. Mildly increased T2-weighted signal within the spinal cord consistent with compressive myelopathy. 2. Large right subarticular disc protrusion at L1-L2 causing severe right lateral recess and severe central spinal canal stenosis. This has worsened compared to 04/21/202020. 3. Large central disc extrusion at L5-S1 with inferior migration causing mass effect on the left S1 nerve root in the lateral recess. Severe bilateral L5-S1 neural foraminal stenosis. Critical Value/emergent results were called by telephone at the time of interpretation on 06/14/2021 at 1:16 am to Dr. Florina Ou, Who verbally acknowledged these results. Electronically Signed   By: Ulyses Jarred M.D.   On: 06/14/2021 01:16  EKG: Independently reviewed. Sinus rhythm.   Assessment/Plan   1. Cervical spinal stenosis with myelopathy; lumbar spinal stenosis  - Presents with gait impairment and sensory impairment involving b/l UEs and LEs and is found to have severe cervical spinal stenosis with compressive myelopathy as well as worsened lumbar spinal stenosis  - Neurosurgery consulting and much appreciated, will follow-up on recommendations    2. Depression, anxiety, cognitive impairment  - She is obtunded on admission after receiving multiple doses of IV Ativan and Haldol; plan to hold sedating medications for now    3. CKD IIIa  - SCr is 1.13 on admission which appears to be her baseline  -  Renally-dose medications, monitor    4. Rheumatoid arthritis  - Pharmacy medication-reconciliation pending    5. Hypertension  - Pharmacy medication reconciliation pending - Treat as needed for now   6. Hx of type II DM  - A1c 6.0% in June 2022; appears she was being diet-controlled at that time  - Monitor, use low-intensity SSI if needed    DVT prophylaxis: SCDs  Code Status: Full  Level of Care: Level of care: Med-Surg Family Communication: Family updated from ED  Disposition Plan:  Patient is from: ILF  Anticipated d/c is to: SNF  Anticipated d/c date is: 06/16/21 Patient currently: Pending neurosurgery evaluation  Consults called: neurosurgery  Admission status: Inpatient     Vianne Bulls, MD [facility]  06/14/2021, 2:51 AM

## 2021-06-14 NOTE — Plan of Care (Signed)
  Problem: Clinical Measurements: Goal: Ability to maintain clinical measurements within normal limits will improve Outcome: Progressing Goal: Will remain free from infection Outcome: Progressing Goal: Diagnostic test results will improve Outcome: Progressing Goal: Respiratory complications will improve Outcome: Progressing Goal: Cardiovascular complication will be avoided Outcome: Progressing   Problem: Nutrition: Goal: Adequate nutrition will be maintained Outcome: Progressing   Problem: Elimination: Goal: Will not experience complications related to bowel motility Outcome: Progressing Goal: Will not experience complications related to urinary retention Outcome: Progressing   Problem: Safety: Goal: Ability to remain free from injury will improve Outcome: Progressing

## 2021-06-14 NOTE — Progress Notes (Signed)
[place] OF CARE NOTE Patient: SHAQUILLA KEHRES LNZ:[id number]   PCP: Aretta Nip, MD DOB: 1945-11-22   DOA: 06/13/2021   DOS: 06/14/2021    Patient was admitted by my colleague earlier on 06/14/2021. I have reviewed the H&P as well as assessment and plan and agree with the same. Important changes in the plan are listed below.  Plan of care: Principal Problem:   Cervical myelopathy ([place]) Active Problems:   Spinal stenosis of lumbar region with neurogenic claudication   Rheumatoid arthritis ([place])   MDD (major depressive disorder), recurrent episode, severe (HCC)   Cognitive impairment   Anxiety   Stage 3a chronic kidney disease (CKD) ([place]) Due to ongoing pain without pain medication as needed. Also resume rheumatoid arthritis medication. Patient is on chronic prednisone and therefore will require stress dose steroids perioperatively. Resuming her home regimen for depression. Awaiting transfer to [facility].  Author: Berle Mull, MD [facility] 06/14/2021 4:38 PM   If 7PM-7AM, please contact night-coverage at [email or web address]

## 2021-06-14 NOTE — ED Provider Notes (Signed)
Nursing notes and vitals signs, including pulse oximetry, reviewed.  Summary of this visit's results, reviewed by myself:  EKG:  EKG Interpretation  Date/Time:  Thursday June 13 2021 15:05:14 EST Ventricular Rate:  77 PR Interval:  144 QRS Duration: 74 QT Interval:  420 QTC Calculation: 475 R Axis:   10 Text Interpretation: Normal sinus rhythm Nonspecific ST abnormality Abnormal ECG No significant change since last tracing Confirmed by Isla Pence [phone]) on 06/13/2021 3:55:37 PM        Labs:  Results for orders placed or performed during the hospital encounter of 06/13/21 (from the past 24 hour(s))  CBC with Differential     Status: Abnormal   Collection Time: 06/13/21  1:04 PM  Result Value Ref Range   WBC 10.2 4.0 - 10.5 K/uL   RBC 3.89 3.87 - 5.11 MIL/uL   Hemoglobin 11.9 (L) 12.0 - 15.0 g/dL   HCT 38.0 36.0 - 46.0 %   MCV 97.7 80.0 - 100.0 fL   MCH 30.6 26.0 - 34.0 pg   MCHC 31.3 30.0 - 36.0 g/dL   RDW 13.9 11.5 - 15.5 %   Platelets 166 150 - 400 K/uL   nRBC 0.0 0.0 - 0.2 %   Neutrophils Relative % 86 %   Neutro Abs 8.9 (H) 1.7 - 7.7 K/uL   Lymphocytes Relative 6 %   Lymphs Abs 0.6 (L) 0.7 - 4.0 K/uL   Monocytes Relative 5 %   Monocytes Absolute 0.5 0.1 - 1.0 K/uL   Eosinophils Relative 1 %   Eosinophils Absolute 0.1 0.0 - 0.5 K/uL   Basophils Relative 1 %   Basophils Absolute 0.1 0.0 - 0.1 K/uL   Immature Granulocytes 1 %   Abs Immature Granulocytes 0.06 0.00 - 0.07 K/uL  Comprehensive metabolic panel     Status: Abnormal   Collection Time: 06/13/21  1:04 PM  Result Value Ref Range   Sodium 139 135 - 145 mmol/L   Potassium 4.4 3.5 - 5.1 mmol/L   Chloride 106 98 - 111 mmol/L   CO2 24 22 - 32 mmol/L   Glucose, Bld 109 (H) 70 - 99 mg/dL   BUN 22 8 - 23 mg/dL   Creatinine, Ser 1.13 (H) 0.44 - 1.00 mg/dL   Calcium 8.3 (L) 8.9 - 10.3 mg/dL   Total Protein 7.0 6.5 - 8.1 g/dL   Albumin 3.8 3.5 - 5.0 g/dL   AST 29 15 - 41 U/L   ALT 19 0 - 44 U/L    Alkaline Phosphatase 100 38 - 126 U/L   Total Bilirubin 0.6 0.3 - 1.2 mg/dL   GFR, Estimated 51 (L) >60 mL/min   Anion gap 9 5 - 15  TSH     Status: None   Collection Time: 06/13/21  1:15 PM  Result Value Ref Range   TSH 0.358 0.350 - 4.500 uIU/mL  Ammonia     Status: None   Collection Time: 06/13/21  2:20 PM  Result Value Ref Range   Ammonia 18 9 - 35 umol/L  Resp Panel by RT-PCR (Flu A&B, Covid) Nasopharyngeal Swab     Status: None   Collection Time: 06/13/21  2:27 PM   Specimen: Nasopharyngeal Swab; Nasopharyngeal(NP) swabs in vial transport medium  Result Value Ref Range   SARS Coronavirus 2 by RT PCR NEGATIVE NEGATIVE   Influenza A by PCR NEGATIVE NEGATIVE   Influenza B by PCR NEGATIVE NEGATIVE  CBG monitoring, ED     Status: Abnormal  Collection Time: 06/13/21  2:37 PM  Result Value Ref Range   Glucose-Capillary 113 (H) 70 - 99 mg/dL  Urinalysis, Routine w reflex microscopic Urine, Clean Catch     Status: None   Collection Time: 06/13/21  8:49 PM  Result Value Ref Range   Color, Urine YELLOW YELLOW   APPearance CLEAR CLEAR   Specific Gravity, Urine 1.012 1.005 - 1.030   pH 6.0 5.0 - 8.0   Glucose, UA NEGATIVE NEGATIVE mg/dL   Hgb urine dipstick NEGATIVE NEGATIVE   Bilirubin Urine NEGATIVE NEGATIVE   Ketones, ur NEGATIVE NEGATIVE mg/dL   Protein, ur NEGATIVE NEGATIVE mg/dL   Nitrite NEGATIVE NEGATIVE   Leukocytes,Ua NEGATIVE NEGATIVE    Imaging Studies: DG Chest 2 View  Result Date: 06/13/2021 CLINICAL DATA:  Altered mental status. Recurrent falls. Generalized weakness. EXAM: CHEST - 2 VIEW COMPARISON:  12/28/2020 FINDINGS: Heart size is normal. Mild tortuosity of the aorta. The lungs are clear. No effusions. Lateral view shows previous spinal fusion with considerable adjacent segment degenerative changes at the upper margin. IMPRESSION: No active cardiopulmonary disease. Spinal degenerative changes at the upper margin of the spinal fusion. Electronically Signed   By:  Nelson Chimes M.D.   On: 06/13/2021 13:29   CT Head Wo Contrast  Result Date: 06/13/2021 CLINICAL DATA:  Altered mental status, multiple falls. EXAM: CT HEAD WITHOUT CONTRAST TECHNIQUE: Contiguous axial images were obtained from the base of the skull through the vertex without intravenous contrast. COMPARISON:  May 24, 2018. FINDINGS: Brain: Mild chronic ischemic white matter disease is noted. No mass effect or midline shift is noted. Ventricular size is within normal limits. There is no evidence of mass lesion, hemorrhage or acute infarction. Vascular: No hyperdense vessel or unexpected calcification. Skull: Normal. Negative for fracture or focal lesion. Sinuses/Orbits: No acute finding. Other: None. IMPRESSION: No acute intracranial abnormality seen. Electronically Signed   By: Marijo Conception M.D.   On: 06/13/2021 15:28   MR BRAIN WO CONTRAST  Result Date: 06/13/2021 CLINICAL DATA:  Acute neuro deficit. EXAM: MRI HEAD WITHOUT CONTRAST TECHNIQUE: Multiplanar, multiecho pulse sequences of the brain and surrounding structures were obtained without intravenous contrast. COMPARISON:  CT head 06/13/2021 FINDINGS: Brain: Motion degraded study.  Allowing for this, no acute infarct Moderate chronic microvascular ischemic change in the white matter. Negative for hydrocephalus or mass lesion. No hemorrhage identified Vascular: Normal arterial flow voids Skull and upper cervical spine: Negative Sinuses/Orbits: Negative Other: None IMPRESSION: Image quality degraded by significant by motion Negative for acute infarct. Moderate chronic microvascular ischemic change in the white matter. Electronically Signed   By: Franchot Gallo M.D.   On: 06/13/2021 18:49   MR Cervical Spine Wo Contrast  Result Date: 06/14/2021 CLINICAL DATA:  Upper and lower extremity weakness EXAM: MRI CERVICAL, THORACIC AND LUMBAR SPINE WITHOUT CONTRAST TECHNIQUE: Multiplanar and multiecho pulse sequences of the cervical spine, to include  the craniocervical junction and cervicothoracic junction, and thoracic and lumbar spine, were obtained without intravenous contrast. COMPARISON:  None. FINDINGS: MRI CERVICAL SPINE FINDINGS Alignment: Physiologic. Vertebrae: C4-7 ACDF Cord: Mildly increased T2-weighted signal within the spinal cord at the C3 level Posterior Fossa, vertebral arteries, paraspinal tissues: Negative Disc levels: At C3-4, there is a large central disc extrusion with superior migration causing severe spinal canal stenosis and compressing the spinal cord. There is no other cervical spinal canal stenosis or advance neural foraminal stenosis. MRI THORACIC SPINE FINDINGS Alignment:  Physiologic. Vertebrae: No fracture, evidence of discitis, or bone  lesion. Cord:  Normal signal and morphology. Paraspinal and other soft tissues: Negative. Disc levels: T8-9 small central disc protrusion without stenosis. The other thoracic levels are unremarkable. MRI LUMBAR SPINE FINDINGS Segmentation:  Standard. Alignment:  Physiologic. Vertebrae: L2-5 PLIF with posterior decompression. No acute abnormality. Chronic height loss of L1. Conus medullaris and cauda equina: Conus extends to the L1 level. Paraspinal and other soft tissues: Left kidney is absent. Disc levels: T12-L1: Small left subarticular disc protrusion without spinal canal or neural foraminal stenosis. L1-L2: Large right subarticular disc protrusion causing severe narrowing of the right lateral recess and severe central spinal canal stenosis. This has worsened compared to 11/02/2018. No neural foraminal stenosis. L2-L3: Posterior fusion and decompression. No spinal canal stenosis. No neural foraminal stenosis. L3-L4: Posterior fusion and decompression. No spinal canal stenosis. No neural foraminal stenosis. L4-L5: Posterior fusion decompression. No spinal canal stenosis. No neural foraminal stenosis. L5-S1: Large central disc extrusion with inferior migration causing mass effect on the left S1  nerve root in the lateral recess. No central spinal canal stenosis. Severe bilateral neural foraminal stenosis. Visualized sacrum: Normal. IMPRESSION: 1. Large central disc extrusion at C3-4 causing severe spinal canal stenosis and compressing the spinal cord. Mildly increased T2-weighted signal within the spinal cord consistent with compressive myelopathy. 2. Large right subarticular disc protrusion at L1-L2 causing severe right lateral recess and severe central spinal canal stenosis. This has worsened compared to 04/21/202020. 3. Large central disc extrusion at L5-S1 with inferior migration causing mass effect on the left S1 nerve root in the lateral recess. Severe bilateral L5-S1 neural foraminal stenosis. Critical Value/emergent results were called by telephone at the time of interpretation on 06/14/2021 at 1:16 am to Dr. Florina Ou, Who verbally acknowledged these results. Electronically Signed   By: Ulyses Jarred M.D.   On: 06/14/2021 01:16   MR THORACIC SPINE WO CONTRAST  Result Date: 06/14/2021 CLINICAL DATA:  Upper and lower extremity weakness EXAM: MRI CERVICAL, THORACIC AND LUMBAR SPINE WITHOUT CONTRAST TECHNIQUE: Multiplanar and multiecho pulse sequences of the cervical spine, to include the craniocervical junction and cervicothoracic junction, and thoracic and lumbar spine, were obtained without intravenous contrast. COMPARISON:  None. FINDINGS: MRI CERVICAL SPINE FINDINGS Alignment: Physiologic. Vertebrae: C4-7 ACDF Cord: Mildly increased T2-weighted signal within the spinal cord at the C3 level Posterior Fossa, vertebral arteries, paraspinal tissues: Negative Disc levels: At C3-4, there is a large central disc extrusion with superior migration causing severe spinal canal stenosis and compressing the spinal cord. There is no other cervical spinal canal stenosis or advance neural foraminal stenosis. MRI THORACIC SPINE FINDINGS Alignment:  Physiologic. Vertebrae: No fracture, evidence of discitis, or bone  lesion. Cord:  Normal signal and morphology. Paraspinal and other soft tissues: Negative. Disc levels: T8-9 small central disc protrusion without stenosis. The other thoracic levels are unremarkable. MRI LUMBAR SPINE FINDINGS Segmentation:  Standard. Alignment:  Physiologic. Vertebrae: L2-5 PLIF with posterior decompression. No acute abnormality. Chronic height loss of L1. Conus medullaris and cauda equina: Conus extends to the L1 level. Paraspinal and other soft tissues: Left kidney is absent. Disc levels: T12-L1: Small left subarticular disc protrusion without spinal canal or neural foraminal stenosis. L1-L2: Large right subarticular disc protrusion causing severe narrowing of the right lateral recess and severe central spinal canal stenosis. This has worsened compared to 11/02/2018. No neural foraminal stenosis. L2-L3: Posterior fusion and decompression. No spinal canal stenosis. No neural foraminal stenosis. L3-L4: Posterior fusion and decompression. No spinal canal stenosis. No neural foraminal stenosis. L4-L5: Posterior  fusion decompression. No spinal canal stenosis. No neural foraminal stenosis. L5-S1: Large central disc extrusion with inferior migration causing mass effect on the left S1 nerve root in the lateral recess. No central spinal canal stenosis. Severe bilateral neural foraminal stenosis. Visualized sacrum: Normal. IMPRESSION: 1. Large central disc extrusion at C3-4 causing severe spinal canal stenosis and compressing the spinal cord. Mildly increased T2-weighted signal within the spinal cord consistent with compressive myelopathy. 2. Large right subarticular disc protrusion at L1-L2 causing severe right lateral recess and severe central spinal canal stenosis. This has worsened compared to 04/21/202020. 3. Large central disc extrusion at L5-S1 with inferior migration causing mass effect on the left S1 nerve root in the lateral recess. Severe bilateral L5-S1 neural foraminal stenosis. Critical  Value/emergent results were called by telephone at the time of interpretation on 06/14/2021 at 1:16 am to Dr. Florina Ou, Who verbally acknowledged these results. Electronically Signed   By: Ulyses Jarred M.D.   On: 06/14/2021 01:16   MR LUMBAR SPINE WO CONTRAST  Result Date: 06/14/2021 CLINICAL DATA:  Upper and lower extremity weakness EXAM: MRI CERVICAL, THORACIC AND LUMBAR SPINE WITHOUT CONTRAST TECHNIQUE: Multiplanar and multiecho pulse sequences of the cervical spine, to include the craniocervical junction and cervicothoracic junction, and thoracic and lumbar spine, were obtained without intravenous contrast. COMPARISON:  None. FINDINGS: MRI CERVICAL SPINE FINDINGS Alignment: Physiologic. Vertebrae: C4-7 ACDF Cord: Mildly increased T2-weighted signal within the spinal cord at the C3 level Posterior Fossa, vertebral arteries, paraspinal tissues: Negative Disc levels: At C3-4, there is a large central disc extrusion with superior migration causing severe spinal canal stenosis and compressing the spinal cord. There is no other cervical spinal canal stenosis or advance neural foraminal stenosis. MRI THORACIC SPINE FINDINGS Alignment:  Physiologic. Vertebrae: No fracture, evidence of discitis, or bone lesion. Cord:  Normal signal and morphology. Paraspinal and other soft tissues: Negative. Disc levels: T8-9 small central disc protrusion without stenosis. The other thoracic levels are unremarkable. MRI LUMBAR SPINE FINDINGS Segmentation:  Standard. Alignment:  Physiologic. Vertebrae: L2-5 PLIF with posterior decompression. No acute abnormality. Chronic height loss of L1. Conus medullaris and cauda equina: Conus extends to the L1 level. Paraspinal and other soft tissues: Left kidney is absent. Disc levels: T12-L1: Small left subarticular disc protrusion without spinal canal or neural foraminal stenosis. L1-L2: Large right subarticular disc protrusion causing severe narrowing of the right lateral recess and severe  central spinal canal stenosis. This has worsened compared to 11/02/2018. No neural foraminal stenosis. L2-L3: Posterior fusion and decompression. No spinal canal stenosis. No neural foraminal stenosis. L3-L4: Posterior fusion and decompression. No spinal canal stenosis. No neural foraminal stenosis. L4-L5: Posterior fusion decompression. No spinal canal stenosis. No neural foraminal stenosis. L5-S1: Large central disc extrusion with inferior migration causing mass effect on the left S1 nerve root in the lateral recess. No central spinal canal stenosis. Severe bilateral neural foraminal stenosis. Visualized sacrum: Normal. IMPRESSION: 1. Large central disc extrusion at C3-4 causing severe spinal canal stenosis and compressing the spinal cord. Mildly increased T2-weighted signal within the spinal cord consistent with compressive myelopathy. 2. Large right subarticular disc protrusion at L1-L2 causing severe right lateral recess and severe central spinal canal stenosis. This has worsened compared to 04/21/202020. 3. Large central disc extrusion at L5-S1 with inferior migration causing mass effect on the left S1 nerve root in the lateral recess. Severe bilateral L5-S1 neural foraminal stenosis. Critical Value/emergent results were called by telephone at the time of interpretation on 06/14/2021 at 1:16 am  to Dr. Florina Ou, Who verbally acknowledged these results. Electronically Signed   By: Ulyses Jarred M.D.   On: 06/14/2021 01:16    1:47 AM Tim open to admit patient to hospitalist service at [facility].  Will consult neurosurgery regarding her cervical and lumbar stenoses.  2:09 AM Dr. Venetia Constable of neurosurgery will see the patient at [facility].     Shanon Rosser, MD 06/14/21 [phone]

## 2021-06-14 NOTE — Progress Notes (Signed)
Discussed w/ ED physician, pt having worsening bilateral upper and lower extremity numbness and weakness over about a week, MRI w/ large cervical disc herniation and cord signal change. Awaiting transfer to [facility], will evaluate the patient upon arrival but likely OR Monday afternoon for ACDF.

## 2021-06-14 NOTE — ED Notes (Addendum)
[facility] called to transport Patient to [facility].  Receiving Nurse made aware.

## 2021-06-15 ENCOUNTER — Inpatient Hospital Stay (HOSPITAL_COMMUNITY): Payer: Medicare Other

## 2021-06-15 ENCOUNTER — Inpatient Hospital Stay (HOSPITAL_COMMUNITY): Payer: Medicare Other | Admitting: Anesthesiology

## 2021-06-15 ENCOUNTER — Encounter (HOSPITAL_COMMUNITY)
Admission: EM | Disposition: A | Discharge status: Skilled Nursing Facility | Payer: Self-pay | Source: Home / Self Care | Attending: Internal Medicine

## 2021-06-15 ENCOUNTER — Encounter (HOSPITAL_COMMUNITY): Payer: Self-pay | Admitting: Family Medicine

## 2021-06-15 HISTORY — PX: ANTERIOR CERVICAL DECOMP/DISCECTOMY FUSION: SHX1161

## 2021-06-15 LAB — CBC
HCT: 34 % — ABNORMAL LOW (ref 36.0–46.0)
Hemoglobin: 10.5 g/dL — ABNORMAL LOW (ref 12.0–15.0)
MCH: 30 pg (ref 26.0–34.0)
MCHC: 30.9 g/dL (ref 30.0–36.0)
MCV: 97.1 fL (ref 80.0–100.0)
Platelets: 137 10*3/uL — ABNORMAL LOW (ref 150–400)
RBC: 3.5 MIL/uL — ABNORMAL LOW (ref 3.87–5.11)
RDW: 13.9 % (ref 11.5–15.5)
WBC: 7.4 10*3/uL (ref 4.0–10.5)
nRBC: 0 % (ref 0.0–0.2)

## 2021-06-15 LAB — GLUCOSE, CAPILLARY
Glucose-Capillary: 104 mg/dL — ABNORMAL HIGH (ref 70–99)
Glucose-Capillary: 95 mg/dL (ref 70–99)
Glucose-Capillary: 181 mg/dL — ABNORMAL HIGH (ref 70–99)
Glucose-Capillary: 168 mg/dL — ABNORMAL HIGH (ref 70–99)

## 2021-06-15 LAB — SURGICAL PCR SCREEN
MRSA, PCR: NEGATIVE
Staphylococcus aureus: POSITIVE — AB

## 2021-06-15 LAB — BASIC METABOLIC PANEL
Anion gap: 9 (ref 5–15)
BUN: 16 mg/dL (ref 8–23)
CO2: 23 mmol/L (ref 22–32)
Calcium: 7.7 mg/dL — ABNORMAL LOW (ref 8.9–10.3)
Chloride: 105 mmol/L (ref 98–111)
Creatinine, Ser: 1.07 mg/dL — ABNORMAL HIGH (ref 0.44–1.00)
GFR, Estimated: 54 mL/min — ABNORMAL LOW (ref >60)
Glucose, Bld: 92 mg/dL (ref 70–99)
Potassium: 3.9 mmol/L (ref 3.5–5.1)
Sodium: 137 mmol/L (ref 135–145)

## 2021-06-15 SURGERY — ANTERIOR CERVICAL DECOMPRESSION/DISCECTOMY FUSION 1 LEVEL
Anesthesia: General | Site: Neck

## 2021-06-15 MED ORDER — DEXAMETHASONE SODIUM PHOSPHATE 10 MG/ML IJ SOLN
INTRAMUSCULAR | Status: DC | PRN
Start: 2021-06-15 — End: 2021-06-15
  Administered 2021-06-15: 10 mg via INTRAVENOUS

## 2021-06-15 MED ORDER — OXYCODONE HCL 5 MG/5ML PO SOLN: 5.0000 mg | Freq: Once | ORAL | Status: DC | PRN

## 2021-06-15 MED ORDER — PHENYLEPHRINE HCL-NACL 20-0.9 MG/250ML-% IV SOLN
INTRAVENOUS | Status: DC | PRN
  Administered 2021-06-15: 50 ug/min via INTRAVENOUS

## 2021-06-15 MED ORDER — OXYCODONE HCL 5 MG PO TABS: 5.0000 mg | ORAL_TABLET | Freq: Once | ORAL | Status: DC | PRN

## 2021-06-15 MED ORDER — LACTATED RINGERS IV SOLN: INTRAVENOUS | Status: DC

## 2021-06-15 MED ORDER — CHLORHEXIDINE GLUCONATE 0.12 % MT SOLN
OROMUCOSAL | Status: AC
  Administered 2021-06-15: 15 mL via OROMUCOSAL
  Filled 2021-06-15: qty 15

## 2021-06-15 MED ORDER — DEXAMETHASONE SODIUM PHOSPHATE 10 MG/ML IJ SOLN
INTRAMUSCULAR | Status: AC
  Filled 2021-06-15: qty 1

## 2021-06-15 MED ORDER — ACETAMINOPHEN 160 MG/5ML PO SOLN: 1000.0000 mg | Freq: Once | ORAL | Status: DC | PRN

## 2021-06-15 MED ORDER — LIDOCAINE 2% (20 MG/ML) 5 ML SYRINGE
INTRAMUSCULAR | Status: DC | PRN
  Administered 2021-06-15: 100 mg via INTRAVENOUS

## 2021-06-15 MED ORDER — ACETAMINOPHEN 10 MG/ML IV SOLN: 1000.0000 mg | Freq: Once | INTRAVENOUS | Status: DC | PRN

## 2021-06-15 MED ORDER — ORAL CARE MOUTH RINSE: 15.0000 mL | Freq: Once | OROMUCOSAL | Status: AC

## 2021-06-15 MED ORDER — ACETAMINOPHEN 500 MG PO TABS: 1000.0000 mg | ORAL_TABLET | Freq: Once | ORAL | Status: DC | PRN

## 2021-06-15 MED ORDER — BUPIVACAINE HCL (PF) 0.5 % IJ SOLN
INTRAMUSCULAR | Status: AC
  Filled 2021-06-15: qty 30

## 2021-06-15 MED ORDER — ONDANSETRON HCL 4 MG/2ML IJ SOLN
INTRAMUSCULAR | Status: DC | PRN
  Administered 2021-06-15: 4 mg via INTRAVENOUS

## 2021-06-15 MED ORDER — ROCURONIUM BROMIDE 10 MG/ML (PF) SYRINGE
PREFILLED_SYRINGE | INTRAVENOUS | Status: AC
  Filled 2021-06-15: qty 10

## 2021-06-15 MED ORDER — CHLORHEXIDINE GLUCONATE 0.12 % MT SOLN: 15.0000 mL | Freq: Once | OROMUCOSAL | Status: AC

## 2021-06-15 MED ORDER — FENTANYL CITRATE (PF) 250 MCG/5ML IJ SOLN
INTRAMUSCULAR | Status: AC
  Filled 2021-06-15: qty 5

## 2021-06-15 MED ORDER — LIDOCAINE-EPINEPHRINE 1 %-1:100000 IJ SOLN
INTRAMUSCULAR | Status: DC | PRN
  Administered 2021-06-15: 3.5 mL via INTRADERMAL

## 2021-06-15 MED ORDER — ONDANSETRON HCL 4 MG/2ML IJ SOLN
INTRAMUSCULAR | Status: AC
  Filled 2021-06-15: qty 2

## 2021-06-15 MED ORDER — PROPOFOL 10 MG/ML IV BOLUS
INTRAVENOUS | Status: DC | PRN
  Administered 2021-06-15: 30 mg via INTRAVENOUS
  Administered 2021-06-15: 50 mg via INTRAVENOUS
  Administered 2021-06-15: 20 mg via INTRAVENOUS

## 2021-06-15 MED ORDER — ROCURONIUM BROMIDE 10 MG/ML (PF) SYRINGE
PREFILLED_SYRINGE | INTRAVENOUS | Status: DC | PRN
  Administered 2021-06-15: 60 mg via INTRAVENOUS

## 2021-06-15 MED ORDER — ARTIFICIAL TEARS OPHTHALMIC OINT
TOPICAL_OINTMENT | OPHTHALMIC | Status: AC
  Filled 2021-06-15: qty 3.5

## 2021-06-15 MED ORDER — ACETAMINOPHEN 10 MG/ML IV SOLN
INTRAVENOUS | Status: DC | PRN
  Administered 2021-06-15: 1000 mg via INTRAVENOUS

## 2021-06-15 MED ORDER — SUGAMMADEX SODIUM 200 MG/2ML IV SOLN
INTRAVENOUS | Status: DC | PRN
  Administered 2021-06-15: 200 mg via INTRAVENOUS

## 2021-06-15 MED ORDER — LIDOCAINE 2% (20 MG/ML) 5 ML SYRINGE
INTRAMUSCULAR | Status: AC
  Filled 2021-06-15: qty 5

## 2021-06-15 MED ORDER — FENTANYL CITRATE (PF) 100 MCG/2ML IJ SOLN
INTRAMUSCULAR | Status: AC
  Administered 2021-06-15: 25 ug via INTRAVENOUS
  Filled 2021-06-15: qty 2

## 2021-06-15 MED ORDER — CEFAZOLIN SODIUM-DEXTROSE 2-3 GM-%(50ML) IV SOLR
INTRAVENOUS | Status: DC | PRN
  Administered 2021-06-15: 2 g via INTRAVENOUS

## 2021-06-15 MED ORDER — MUPIROCIN 2 % EX OINT
TOPICAL_OINTMENT | CUTANEOUS | Status: AC
  Filled 2021-06-15: qty 22

## 2021-06-15 MED ORDER — FENTANYL CITRATE (PF) 100 MCG/2ML IJ SOLN
25.0000 ug | INTRAMUSCULAR | Status: DC | PRN
  Administered 2021-06-15 (×3): 25 ug via INTRAVENOUS

## 2021-06-15 MED ORDER — PROPOFOL 10 MG/ML IV BOLUS
INTRAVENOUS | Status: AC
  Filled 2021-06-15: qty 20

## 2021-06-15 MED ORDER — PHENYLEPHRINE 40 MCG/ML (10ML) SYRINGE FOR IV PUSH (FOR BLOOD PRESSURE SUPPORT)
PREFILLED_SYRINGE | INTRAVENOUS | Status: DC | PRN
  Administered 2021-06-15 (×3): 80 ug via INTRAVENOUS

## 2021-06-15 MED ORDER — THROMBIN 5000 UNITS EX SOLR
OROMUCOSAL | Status: DC | PRN
  Administered 2021-06-15: 5 mL via TOPICAL

## 2021-06-15 MED ORDER — LIDOCAINE-EPINEPHRINE 1 %-1:100000 IJ SOLN
INTRAMUSCULAR | Status: AC
  Filled 2021-06-15: qty 1

## 2021-06-15 MED ORDER — PHENYLEPHRINE 40 MCG/ML (10ML) SYRINGE FOR IV PUSH (FOR BLOOD PRESSURE SUPPORT)
PREFILLED_SYRINGE | INTRAVENOUS | Status: AC
  Filled 2021-06-15: qty 10

## 2021-06-15 MED ORDER — BUPIVACAINE HCL 0.5 % IJ SOLN
INTRAMUSCULAR | Status: DC | PRN
  Administered 2021-06-15: 3.5 mL

## 2021-06-15 MED ORDER — 0.9 % SODIUM CHLORIDE (POUR BTL) OPTIME
TOPICAL | Status: DC | PRN
  Administered 2021-06-15: 1000 mL

## 2021-06-15 MED ORDER — ACETAMINOPHEN 10 MG/ML IV SOLN
INTRAVENOUS | Status: AC
  Filled 2021-06-15: qty 100

## 2021-06-15 MED ORDER — THROMBIN 5000 UNITS EX SOLR
CUTANEOUS | Status: AC
  Filled 2021-06-15: qty 5000

## 2021-06-15 MED ORDER — FENTANYL CITRATE (PF) 250 MCG/5ML IJ SOLN
INTRAMUSCULAR | Status: DC | PRN
  Administered 2021-06-15: 100 ug via INTRAVENOUS

## 2021-06-15 MED ORDER — CEFAZOLIN SODIUM-DEXTROSE 2-4 GM/100ML-% IV SOLN
INTRAVENOUS | Status: AC
  Filled 2021-06-15: qty 100

## 2021-06-15 SURGICAL SUPPLY — 68 items
ADH SKN CLS APL DERMABOND .7 (GAUZE/BANDAGES/DRESSINGS) ×1
APL SKNCLS STERI-STRIP NONHPOA (GAUZE/BANDAGES/DRESSINGS)
BAG COUNTER SPONGE SURGICOUNT (BAG) ×3 IMPLANT
BAG SPNG CNTER NS LX DISP (BAG) ×2
BAND INSRT 18 STRL LF DISP RB (MISCELLANEOUS) ×2
BAND RUBBER #18 3X1/16 STRL (MISCELLANEOUS) ×4 IMPLANT
BENZOIN TINCTURE PRP APPL 2/3 (GAUZE/BANDAGES/DRESSINGS) IMPLANT
BIT DRILL 13 (BIT) ×1 IMPLANT
BLADE CLIPPER SURG (BLADE) IMPLANT
BLADE SURG 11 STRL SS (BLADE) ×2 IMPLANT
BLADE ULTRA TIP 2M (BLADE) IMPLANT
BUR MATCHSTICK NEURO 3.0 LAGG (BURR) ×2 IMPLANT
CANISTER SUCT 3000ML PPV (MISCELLANEOUS) ×2 IMPLANT
CARTRIDGE OIL MAESTRO DRILL (MISCELLANEOUS) ×1 IMPLANT
COVER PLATE (Plate) ×1 IMPLANT
DECANTER SPIKE VIAL GLASS SM (MISCELLANEOUS) ×2 IMPLANT
DERMABOND ADVANCED (GAUZE/BANDAGES/DRESSINGS) ×1
DERMABOND ADVANCED .7 DNX12 (GAUZE/BANDAGES/DRESSINGS) ×1 IMPLANT
DEVICE FUSION NANLCK 6 DEG 6 (Screw) IMPLANT
DIFFUSER DRILL AIR PNEUMATIC (MISCELLANEOUS) ×2 IMPLANT
DRAPE C-ARM 42X72 X-RAY (DRAPES) ×4 IMPLANT
DRAPE HALF SHEET 40X57 (DRAPES) IMPLANT
DRAPE LAPAROTOMY 100X72 PEDS (DRAPES) ×2 IMPLANT
DRAPE MICROSCOPE LEICA (MISCELLANEOUS) ×2 IMPLANT
DRSG OPSITE 4X5.5 SM (GAUZE/BANDAGES/DRESSINGS) ×4 IMPLANT
DRSG OPSITE POSTOP 4X6 (GAUZE/BANDAGES/DRESSINGS) ×1 IMPLANT
DURAPREP 6ML APPLICATOR 50/CS (WOUND CARE) ×2 IMPLANT
ELECT COATED BLADE 2.86 ST (ELECTRODE) ×2 IMPLANT
ELECT REM PT RETURN 9FT ADLT (ELECTROSURGICAL) ×2
ELECTRODE REM PT RTRN 9FT ADLT (ELECTROSURGICAL) ×1 IMPLANT
FUSION TCS NANOLOCK 6 DEG 6 (Screw) ×2 IMPLANT
GAUZE 4X4 16PLY ~~LOC~~+RFID DBL (SPONGE) IMPLANT
GLOVE EXAM NITRILE XL STR (GLOVE) IMPLANT
GLOVE SURG ENC MOIS LTX SZ7 (GLOVE) ×1 IMPLANT
GLOVE SURG ENC MOIS LTX SZ7.5 (GLOVE) IMPLANT
GLOVE SURG ENC TEXT LTX SZ7 (GLOVE) ×1 IMPLANT
GLOVE SURG LTX SZ7 (GLOVE) ×2 IMPLANT
GLOVE SURG UNDER POLY LF SZ7.5 (GLOVE) ×4 IMPLANT
GOWN STRL REUS W/ TWL LRG LVL3 (GOWN DISPOSABLE) ×2 IMPLANT
GOWN STRL REUS W/ TWL XL LVL3 (GOWN DISPOSABLE) IMPLANT
GOWN STRL REUS W/TWL 2XL LVL3 (GOWN DISPOSABLE) IMPLANT
GOWN STRL REUS W/TWL LRG LVL3 (GOWN DISPOSABLE) ×4
GOWN STRL REUS W/TWL XL LVL3 (GOWN DISPOSABLE)
HEMOSTAT POWDER KIT SURGIFOAM (HEMOSTASIS) ×2 IMPLANT
KIT BASIN OR (CUSTOM PROCEDURE TRAY) ×2 IMPLANT
KIT TURNOVER KIT B (KITS) ×2 IMPLANT
MARKER SKIN DUAL TIP RULER LAB (MISCELLANEOUS) ×1 IMPLANT
NDL SPNL 22GX3.5 QUINCKE BK (NEEDLE) ×1 IMPLANT
NEEDLE HYPO 22GX1.5 SAFETY (NEEDLE) ×2 IMPLANT
NEEDLE SPNL 22GX3.5 QUINCKE BK (NEEDLE) ×2 IMPLANT
NS IRRIG 1000ML POUR BTL (IV SOLUTION) ×2 IMPLANT
OIL CARTRIDGE MAESTRO DRILL (MISCELLANEOUS) ×2
PACK LAMINECTOMY NEURO (CUSTOM PROCEDURE TRAY) ×2 IMPLANT
PAD ARMBOARD 7.5X6 YLW CONV (MISCELLANEOUS) ×6 IMPLANT
PLATE LOCK ENDO TCS (Plate) ×1 IMPLANT
PLATE LOCK ENDO TCS F/COVER (Plate) IMPLANT
PUTTY DBF 1CC CORTICAL FIBERS (Putty) ×1 IMPLANT
SCREW LOCKING 14MMX3.5MM (Screw) ×2 IMPLANT
SPONGE INTESTINAL PEANUT (DISPOSABLE) ×2 IMPLANT
SPONGE SURGIFOAM ABS GEL SZ50 (HEMOSTASIS) ×2 IMPLANT
STAPLER VISISTAT 35W (STAPLE) ×2 IMPLANT
STRIP CLOSURE SKIN 1/2X4 (GAUZE/BANDAGES/DRESSINGS) IMPLANT
SUT VIC AB 3-0 SH 8-18 (SUTURE) ×2 IMPLANT
SUT VICRYL 3-0 RB1 18 ABS (SUTURE) ×4 IMPLANT
TAPE CLOTH 3X10 TAN LF (GAUZE/BANDAGES/DRESSINGS) ×2 IMPLANT
TOWEL GREEN STERILE (TOWEL DISPOSABLE) ×2 IMPLANT
TOWEL GREEN STERILE FF (TOWEL DISPOSABLE) ×2 IMPLANT
WATER STERILE IRR 1000ML POUR (IV SOLUTION) ×2 IMPLANT

## 2021-06-15 NOTE — Op Note (Signed)
NEUROSURGERY OPERATIVE NOTE   PREOP DIAGNOSIS: Cervical stenosis with myelopathy, C3-4  POSTOP DIAGNOSIS: Same  PROCEDURE: 1. Discectomy at C3-4 for decompression of spinal cord and exiting nerve roots  2. Placement of intervertebral biomechanical device [employer] zero-profile 38mm cage, 54mm screws 3. Use of morselized bone allograft  4. Arthrodesis C3-4, anterior interbody technique  5. Use of intraoperative microscope  SURGEON: Dr. Consuella Lose, MD  ASSISTANT: None  ANESTHESIA: General Endotracheal  EBL: Minimal   SPECIMENS: None  DRAINS: None  COMPLICATIONS: None immediate  CONDITION: Hemodynamically stable to PACU  HISTORY: [name] is a 75 y.o. y.o. female who initially presented to the emergency department complaining of several weeks of gait instability.  Upon her transfer to [facility], she admitted to relatively rapid, progressively worsening weakness involving both the upper and lower extremities over the last several days.  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  Imaging of the cervical spine also showed relatively large central disc herniation with severe spinal cord compression and T2 signal change within the cord.  Of note, the patient had previously undergone 3 level anterior cervical discectomy back in 2004 from C4-C7.  Relatively urgent surgical decompression was therefore indicated.  The risks, benefits, and alternatives of the surgery were reviewed in detail with both the patient and her niece over the phone.  After all her questions were answered, her niece provided informed consent on the patient's behalf to proceed.  PROCEDURE IN DETAIL: The patient was brought to the operating room and transferred to the operative table. After induction of general anesthesia, the patient was positioned on the operative table in the supine position with all pressure points meticulously padded.  Previous left-sided transverse skin  incision was identified.  I marked out a new incision more superiorly in order to access the more superior C3-4 disc.  The skin of the left neck was then prepped and draped in standard sterile fashion.  After timeout was conducted, the skin was infiltrated with local anesthetic. Skin incision was then made sharply and Bovie electrocautery was used to dissect the subcutaneous tissue until the platysma was identified. The platysma was then divided and undermined. The sternocleidomastoid muscle was then identified and, utilizing natural fascial planes in the neck, the prevertebral fascia was identified and the carotid sheath was retracted laterally and the trachea and esophagus retracted medially.  Spinal needle was introduced into the disc space and our position was confirmed using lateral fluoroscopy to be at C3-4.  Bovie electrocautery was used to dissect in the subperiosteal plane and elevate the bilateral longus coli muscles.  The previously placed anterior cervical plate was identified extending essentially to the C4 endplate.  Self-retaining retractors were then placed. At this point, the microscope was draped and brought into the field, and the remainder of the case was done under the microscope using microdissecting technique.  The disc space was incised sharply and rongeurs were use to initially complete a discectomy.  This included removal of small osteophytes from C3 and C4 anteriorly.  The high-speed drill was then used to complete discectomy until the posterior annulus was identified and removed and the posterior longitudinal ligament was identified. Using a nerve hook, the PLL was elevated, and Kerrison rongeurs were used to remove the posterior longitudinal ligament.  Multiple large free disc fragments were noted posterior to the posterior longitudinal ligament significantly compressing the thecal sac.  Using a combination of ball-tipped hooks and micro hooks, these disc fragments were removed.  Kerrison punches were used to remove the posterior longitudinal ligament piecemeal as well as the inferior corner of C3 and the superior corner of C4.  Of note, there were multiple large fragments within the left lateral recess of the disc space.  Once all the disc fragments were removed, the thecal sac was noted to take on a much more normal position.  I was easily able to pass a small ball dissector within the ventral epidural space and out the foramen indicating good decompression.  Having completed our decompression, attention was turned to placement of the intervertebral device.  Because the previous anterior plate was noted to be at the level of the endplate of C4, I did not feel that placement of a new plate would be feasible.  I therefore selected a 6 mm lordotic 0 profile cage which was filled with morselized bone allograft.  This was then tapped into place and slightly countersunk to the anterior edge of the vertebral bodies.  Position was checked with lateral fluoroscopy.  14 mm screws were then placed in C3 and C4. Final fluoroscopic image was taken which demonstrated good position of the implanted hardware and good cervical alignment.  At this point, after all counts were verified to be correct, meticulous hemostasis was secured using a combination of bipolar electrocautery and passive hemostatics. The platysma muscle was then closed using interrupted 3-0 Vicryl sutures, and the skin was closed with an interrupted 3-0 Vicry subcuticular stitch. Dermabond and sterile dressings were then applied and the drapes removed.  The patient tolerated the procedure well and was extubated in the room and taken to the postanesthesia care unit in stable condition.   Consuella Lose, MD [facility]

## 2021-06-15 NOTE — Consult Note (Signed)
Chief Complaint   Chief Complaint  Patient presents with   Fall    History of Present Illness  [name] is a 75 y.o. female transfer from [facility] with a history of anxiety, depression, and cognitive impairment as well as previous history of lumbar decompressive laminectomy by Dr. Ellene Route.  Patient presents with family reporting that she has been unsteady while walking and has had multiple falls.  In speaking with the patient today, she does report about 2 to 3 weeks of difficulty walking.  While she cannot provide exact details, she says that over the last few days her arms have progressively felt "heavier."  She does also complain of a fair bit of leg pain.  She describes electric shock type feelings intermittently primarily in her left arm.  Past Medical History   Past Medical History:  Diagnosis Date   Anxiety    Arthritis    rhematoid,osteoarthritis   Diabetes mellitus without complication (HCC)    Dyspnea    increased exertion   Elevated cholesterol    Hemorrhoids    Hypertension    Hypothyroidism    Joint pain    Nocturia    Sleeping difficulties     Past Surgical History   Past Surgical History:  Procedure Laterality Date   ABDOMINAL HYSTERECTOMY     BACK SURGERY     CERVICAL FUSION  2004   CHOLECYSTECTOMY     EYE SURGERY Left    cataract   PARS PLANA REPAIR OF RETINAL DEATACHMENT     ROBOT ASSITED LAPAROSCOPIC NEPHROURETERECTOMY Left 12/26/2020   Procedure: XI ROBOT ASSITED LAPAROSCOPIC  LEFT NEPHROURETERECTOMY/ STENT PLACEMENT/ TRANSURETHRAL RESECTION OF LEFT URETERAL ORIFICE;EXCISION OF SPLENULE;  Surgeon: Ceasar Mons, MD;  Location: [facility];  Service: Urology;  Laterality: Left;   THYROIDECTOMY, PARTIAL      Social History   Social History   Tobacco Use   Smoking status: Never   Smokeless tobacco: Never  Vaping Use   Vaping Use: Never used  Substance Use Topics   Alcohol use: No   Drug use: No    Medications   Prior to  Admission medications   Medication Sig Start Date End Date Taking? Authorizing Provider  aspirin 81 MG chewable tablet Chew 81 mg by mouth daily. 05/24/21  Yes [provider]  benztropine (COGENTIN) 0.5 MG tablet Take 0.5 mg by mouth 2 (two) times daily.   Yes [provider]  citalopram (CELEXA) 10 MG tablet Take 10 mg by mouth in the morning.   Yes [provider]  hydroxychloroquine (PLAQUENIL) 200 MG tablet Take 400 mg by mouth 2 (two) times daily.   Yes [provider]  leflunomide (ARAVA) 20 MG tablet Take 20 mg by mouth daily.   Yes [provider]  metoprolol succinate (TOPROL-XL) 25 MG 24 hr tablet Take 25 mg by mouth daily.   Yes [provider]  OLANZapine (ZYPREXA) 2.5 MG tablet Take 2.5 mg by mouth daily.   Yes [provider]  predniSONE (DELTASONE) 5 MG tablet Take 5 mg by mouth daily with breakfast.   Yes [provider]  ACCU-CHEK GUIDE test strip CHECK BLOOD SUGAR ONCE A DAY 10/29/17   [provider]  docusate sodium (COLACE) 100 MG capsule Take 1 capsule (100 mg total) by mouth 2 (two) times daily. Patient not taking: Reported on 06/14/2021 12/26/20   Debbrah Alar, PA-C  escitalopram (LEXAPRO) 10 MG tablet Take 10 mg by mouth daily. Patient not taking:  Reported on 06/14/2021    [provider]  polyethylene glycol (MIRALAX / GLYCOLAX) 17 g packet Take 17 g by mouth 2 (two) times daily. Patient not taking: Reported on 06/14/2021 12/31/20   Regalado, Jerald Kief A, MD  promethazine (PHENERGAN) 12.5 MG tablet Take 1 tablet (12.5 mg total) by mouth every 4 (four) hours as needed for nausea or vomiting. Patient not taking: Reported on 06/14/2021 12/26/20   Debbrah Alar, PA-C    Allergies   Allergies  Allergen Reactions   Contrast Media [Iodinated Diagnostic Agents] Anaphylaxis, Hives and Other (See Comments)    Only the "retina dye"   Abilify [Aripiprazole] Other (See Comments)    Pt does not  recall reaction   Ultram [Tramadol Hcl] Other (See Comments)    Reaction unknown   Zithromax [Azithromycin] Anxiety and Other (See Comments)    Heightens anxiety    Review of Systems  ROS  Neurologic Exam  Awake, alert Speech fluent, appropriate CN grossly intact Motor exam: Upper Extremities Deltoid Bicep Tricep Grip  Right 3/5 4-/5 4-/5 3/5  Left 2/5 1/5 1/5 1/5   Lower Extremities IP Quad PF DF EHL  Right 4-/5 3/5 4/5 3/5 3/5  Left 4-/5 2/5 4/5 3/5 3/5   Sensation grossly intact to LT  Imaging  MRI of the cervical spine dated 06/13/2021 was personally reviewed.  This demonstrates maintenance of cervical lordosis.  Primary finding is at C3-4 where there is a large central to slightly left eccentric disc herniation with severe spinal cord compression.  There is associated T2 signal change within the spinal cord at this level.  MRI of the lumbar spine was also reviewed.  This does demonstrate evidence of previous decompression L2-L5 with pedicle screw stabilization.  There is a relatively large broad-based right eccentric disc herniation at L1-2 with resultant severe stenosis centrally and in the lateral recesses.  In addition, there is a large left eccentric inferiorly migrated disc herniation at L5-S1 with likely compression of the left S1 nerve root.  Impression  - 75 y.o. female with several weeks of gait instability and several days of progressively worsening upper extremity weakness now to the point of significant quadriparesis.  Imaging does reveal a large disc herniation with severe spinal cord compression at C3-4.  Given her relatively rapid recent decline I think urgent surgical decompression of the cervical spine is indicated.  Depending on her functional status after this, we can consider addressing her lumbar spine pathology  Plan  -We will plan on proceeding today with anterior cervical discectomy and fusion at C3-4 -I have reviewed the situation with the primary  service.  All her medical comorbidities appear to be chronic and relatively well controlled, without any contraindication to proceeding with surgery. - I will speak with the patient's sister regarding the situation   Consuella Lose, MD [facility]

## 2021-06-15 NOTE — Plan of Care (Signed)
  Problem: Education: Goal: Knowledge of General Education information will improve Description: Including pain rating scale, medication(s)/side effects and non-pharmacologic comfort measures Outcome: Progressing   Problem: Clinical Measurements: Goal: Ability to maintain clinical measurements within normal limits will improve Outcome: Progressing Goal: Will remain free from infection Outcome: Progressing Goal: Diagnostic test results will improve Outcome: Progressing Goal: Respiratory complications will improve Outcome: Progressing Goal: Cardiovascular complication will be avoided Outcome: Progressing   Problem: Nutrition: Goal: Adequate nutrition will be maintained Outcome: Progressing   Problem: Elimination: Goal: Will not experience complications related to bowel motility Outcome: Progressing Goal: Will not experience complications related to urinary retention Outcome: Progressing   Problem: Safety: Goal: Ability to remain free from injury will improve Outcome: Progressing   Problem: Skin Integrity: Goal: Risk for impaired skin integrity will decrease Outcome: Progressing

## 2021-06-15 NOTE — Progress Notes (Addendum)
I attempted to call the patient's sister twice unsuccessfully. I then spoke with the patient's niece who was an Geologist, engineering for 30 years to review the situation. I reviewed the MRI c-spine and L-spine findings and the need for surgery. Given the relatively rapid decline I did recommend more urgent surgery. We discussed the risks of surgery including bleeding, infection, CSF leak, and spinal cord injury leading to worsening weakness, numbness, paralysis. We also discussed general risks of surgery including heart attack, stroke, and blood clots. All her questions today were answered and she provided informed consent on her aunt's behalf.  Consuella Lose, MD [facility]

## 2021-06-15 NOTE — Progress Notes (Signed)
PROGRESS NOTE    [name]  ZOX:[id number] DOB: January 12, 1946 DOA: 06/13/2021 PCP: Aretta Nip, MD   Brief Narrative:  [name] is a pleasant 75 y.o. female with medical history significant for depression, anxiety, cognitive impairment, hypertension, rheumatoid arthritis, and diabetes mellitus, now presenting to the emergency department for evaluation of extremity numbness, difficulty ambulating, and recurrent falls with several days of difficulty ambulating and has complained of numbness and tingling in her bilateral hands and lower extremities.  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  Depression, anxiety, cognitive impairment  -Continue home medications including Lexapro, Zyprexa, planned surgery later today, once anesthesia wears off patient may require additional doses depending on anxiety level   CKD IIIa, at baseline -Continue to follow   Rheumatoid arthritis  - Pharmacy medication-reconciliation pending     Hypertension  - Pharmacy medication reconciliation pending - Treat as needed for now    Hx of type II DM  - A1c 5.4% - diet-controlled - Monitor, use low-intensity SSI if needed      DVT prophylaxis: SCDs  Code Status: Full  Family Communication: None present  Status is: Inpatient  Dispo: The patient is from: Home              Anticipated d/c is to: To be determined              Anticipated d/c date is: 48 to 72 hours pending clinical course and postsurgical recovery              Patient currently not medically stable for discharge  Consultants:   Neurosurgery  Procedures:  Anterior cervical discectomy and fusion at C3-4 tentatively planned this afternoon  Antimicrobials:  Perioperatively  Subjective: No acute issues or events overnight  Objective: Vitals:   06/14/21 2003 06/14/21 2344 06/15/21 0346 06/15/21 0416  BP: (!) 125/43 (!) 118/97 (!) 174/94 (!) 158/57  Pulse: (!) 101 (!) 101 (!) 104 99  Resp: 17 17 15    Temp: 98 F (36.7 C) 98.3 F (36.8 C) 98 F (36.7 C)   TempSrc: Oral Oral Oral   SpO2: 95% 96% 93%   Weight:      Height:        Intake/Output Summary (Last 24 hours) at 06/15/2021 0735 Last data filed at 06/15/2021 0400 Gross per 24 hour  Intake 840.56 ml  Output --  Net 840.56 ml   Filed Weights   06/13/21 1313  Weight: 77.1 kg    Examination:  General:  Pleasantly resting in bed, No acute distress. HEENT:  Normocephalic atraumatic.  Sclerae nonicteric, noninjected.  Extraocular movements intact bilaterally. Neck:  Without mass or deformity.  Trachea is midline. Lungs:  Clear to auscultate bilaterally without rhonchi, wheeze, or rales. Heart:  Regular rate and rhythm.  Without murmurs, rubs, or gallops. Abdomen:  Soft, nontender, nondistended.  Without guarding or rebound. Extremities: Profound weakness both upper and lower extremities bilaterally. Vascular:  Dorsalis pedis and posterior tibial pulses palpable bilaterally. Skin:  Warm and dry, no erythema, no ulcerations.  Data Reviewed: I have personally reviewed following labs and imaging studies  CBC: Recent Labs  Lab 06/13/21 1304 06/14/21 0445 06/15/21 0043  WBC 10.2 7.7 7.4  NEUTROABS 8.9*  --   --   HGB 11.9* 11.5* 10.5*  HCT 38.0 36.4 34.0*  MCV 97.7 98.6 97.1  PLT 166 141* 654*   Basic Metabolic Panel: Recent Labs  Lab 06/13/21 1304 06/14/21 0445 06/15/21 0043  NA 139 138 137  K 4.4 3.6 3.9  CL 106 107 105  CO2 24 24 23   GLUCOSE 109* 84 92  BUN 22 18 16   CREATININE 1.13* 0.99 1.07*  CALCIUM 8.3* 7.9* 7.7*    GFR: Estimated Creatinine Clearance: 44.7 mL/min (A) (by C-G formula based on SCr of 1.07 mg/dL (H)). Liver Function Tests: Recent Labs  Lab 06/13/21 1304  AST 29  ALT 19  ALKPHOS 100  BILITOT 0.6  PROT 7.0  ALBUMIN 3.8   No results for input(s): LIPASE, AMYLASE in the last 168 hours. Recent Labs  Lab 06/13/21 1420  AMMONIA 18   Coagulation Profile: No results for input(s): INR, PROTIME in the last 168 hours. Cardiac Enzymes: No results for input(s): CKTOTAL, CKMB, CKMBINDEX, TROPONINI in the last 168 hours. BNP (last 3 results) No results for input(s): PROBNP in the last 8760 hours. HbA1C: Recent Labs    06/14/21 2112  HGBA1C 5.4   CBG: Recent Labs  Lab 06/13/21 1437 06/14/21 2053 06/15/21 0610  GLUCAP 113* 76 104*   Lipid Profile: No results for input(s): CHOL, HDL, LDLCALC, TRIG, CHOLHDL, LDLDIRECT in the last 72 hours. Thyroid Function Tests: Recent Labs    06/13/21 1315  TSH 0.358   Anemia Panel: No results for input(s): VITAMINB12, FOLATE, FERRITIN, TIBC, IRON, RETICCTPCT in the last 72 hours. Sepsis Labs: No results for input(s): PROCALCITON, LATICACIDVEN in the last 168 hours.  Recent Results (from the past 240 hour(s))  Resp Panel by RT-PCR (Flu A&B, Covid) Nasopharyngeal Swab     Status: None   Collection Time: 06/13/21  2:27 PM   Specimen: Nasopharyngeal Swab; Nasopharyngeal(NP) swabs in vial transport medium  Result Value Ref Range Status   SARS Coronavirus 2 by RT PCR NEGATIVE NEGATIVE Final    Comment: (NOTE) SARS-CoV-2 target nucleic acids are NOT DETECTED.  The SARS-CoV-2 RNA is generally detectable in upper respiratory specimens during the acute phase of infection. The lowest concentration of SARS-CoV-2 viral copies this assay can detect is 138 copies/mL. A negative result does not preclude SARS-Cov-2 infection and should not be used as the sole basis for treatment or other patient management decisions. A negative result may occur  with  improper specimen collection/handling, submission of specimen other than nasopharyngeal swab, presence of viral mutation(s) within the areas targeted by this assay, and inadequate number of viral copies(<138 copies/mL). A negative result must be combined with clinical observations, patient history, and epidemiological information. The expected result is Negative.  Fact Sheet for Patients:  EntrepreneurPulse.com.au  Fact Sheet for Healthcare Providers:  IncredibleEmployment.be  This test is no t yet approved or cleared by the [place] FDA and  has been authorized for detection and/or diagnosis of SARS-CoV-2 by FDA under an Emergency Use Authorization (EUA). This EUA will remain  in effect (meaning this test can be used) for the duration of the COVID-19 declaration under Section 564(b)(1) of the Act, 21 U.S.C.section 360bbb-3(b)(1), unless the authorization is terminated  or revoked sooner.       Influenza A by PCR NEGATIVE NEGATIVE Final   Influenza B by PCR NEGATIVE NEGATIVE Final    Comment: (NOTE) The Xpert Xpress SARS-CoV-2/FLU/RSV plus assay is intended as an  aid in the diagnosis of influenza from Nasopharyngeal swab specimens and should not be used as a sole basis for treatment. Nasal washings and aspirates are unacceptable for Xpert Xpress SARS-CoV-2/FLU/RSV testing.  Fact Sheet for Patients: EntrepreneurPulse.com.au  Fact Sheet for Healthcare Providers: IncredibleEmployment.be  This test is not yet approved or cleared by the [place] FDA and has been authorized for detection and/or diagnosis of SARS-CoV-2 by FDA under an Emergency Use Authorization (EUA). This EUA will remain in effect (meaning this test can be used) for the duration of the COVID-19 declaration under Section 564(b)(1) of the Act, 21 U.S.C. section 360bbb-3(b)(1), unless the authorization is terminated  or revoked.  Performed at [facility], [place] [address] [place], [place] [address]          Radiology Studies: DG Chest 2 View  Result Date: 06/13/2021 CLINICAL DATA:  Altered mental status. Recurrent falls. Generalized weakness. EXAM: CHEST - 2 VIEW COMPARISON:  12/28/2020 FINDINGS: Heart size is normal. Mild tortuosity of the aorta. The lungs are clear. No effusions. Lateral view shows previous spinal fusion with considerable adjacent segment degenerative changes at the upper margin. IMPRESSION: No active cardiopulmonary disease. Spinal degenerative changes at the upper margin of the spinal fusion. Electronically Signed   By: Nelson Chimes M.D.   On: 06/13/2021 13:29   CT Head Wo Contrast  Result Date: 06/13/2021 CLINICAL DATA:  Altered mental status, multiple falls. EXAM: CT HEAD WITHOUT CONTRAST TECHNIQUE: Contiguous axial images were obtained from the base of the skull through the vertex without intravenous contrast. COMPARISON:  May 24, 2018. FINDINGS: Brain: Mild chronic ischemic white matter disease is noted. No mass effect or midline shift is noted. Ventricular size is within normal limits. There is no evidence of mass lesion, hemorrhage or acute infarction. Vascular: No hyperdense vessel or unexpected calcification. Skull: Normal. Negative for fracture or focal lesion. Sinuses/Orbits: No acute finding. Other: None. IMPRESSION: No acute intracranial abnormality seen. Electronically Signed   By: Marijo Conception M.D.   On: 06/13/2021 15:28   MR BRAIN WO CONTRAST  Result Date: 06/13/2021 CLINICAL DATA:  Acute neuro deficit. EXAM: MRI HEAD WITHOUT CONTRAST TECHNIQUE: Multiplanar, multiecho pulse sequences of the brain and surrounding structures were obtained without intravenous contrast. COMPARISON:  CT head 06/13/2021 FINDINGS: Brain: Motion degraded study.  Allowing for this, no acute infarct Moderate chronic microvascular ischemic change in the white matter.  Negative for hydrocephalus or mass lesion. No hemorrhage identified Vascular: Normal arterial flow voids Skull and upper cervical spine: Negative Sinuses/Orbits: Negative Other: None IMPRESSION: Image quality degraded by significant by motion Negative for acute infarct. Moderate chronic microvascular ischemic change in the white matter. Electronically Signed   By: Franchot Gallo M.D.   On: 06/13/2021 18:49   MR Cervical Spine Wo Contrast  Result Date: 06/14/2021 CLINICAL DATA:  Upper and lower extremity weakness EXAM: MRI CERVICAL, THORACIC AND LUMBAR SPINE WITHOUT CONTRAST TECHNIQUE: Multiplanar and multiecho pulse sequences of the cervical spine, to include the craniocervical junction and cervicothoracic junction, and thoracic and lumbar spine, were obtained without intravenous contrast. COMPARISON:  None. FINDINGS: MRI CERVICAL SPINE FINDINGS Alignment: Physiologic. Vertebrae: C4-7 ACDF Cord: Mildly increased T2-weighted signal within the spinal cord at the C3 level Posterior Fossa, vertebral arteries, paraspinal tissues: Negative Disc levels: At C3-4, there is a large central disc extrusion with superior migration causing severe spinal canal stenosis and compressing the spinal cord. There is no other cervical spinal canal stenosis or advance neural foraminal stenosis. MRI  THORACIC SPINE FINDINGS Alignment:  Physiologic. Vertebrae: No fracture, evidence of discitis, or bone lesion. Cord:  Normal signal and morphology. Paraspinal and other soft tissues: Negative. Disc levels: T8-9 small central disc protrusion without stenosis. The other thoracic levels are unremarkable. MRI LUMBAR SPINE FINDINGS Segmentation:  Standard. Alignment:  Physiologic. Vertebrae: L2-5 PLIF with posterior decompression. No acute abnormality. Chronic height loss of L1. Conus medullaris and cauda equina: Conus extends to the L1 level. Paraspinal and other soft tissues: Left kidney is absent. Disc levels: T12-L1: Small left subarticular  disc protrusion without spinal canal or neural foraminal stenosis. L1-L2: Large right subarticular disc protrusion causing severe narrowing of the right lateral recess and severe central spinal canal stenosis. This has worsened compared to 11/02/2018. No neural foraminal stenosis. L2-L3: Posterior fusion and decompression. No spinal canal stenosis. No neural foraminal stenosis. L3-L4: Posterior fusion and decompression. No spinal canal stenosis. No neural foraminal stenosis. L4-L5: Posterior fusion decompression. No spinal canal stenosis. No neural foraminal stenosis. L5-S1: Large central disc extrusion with inferior migration causing mass effect on the left S1 nerve root in the lateral recess. No central spinal canal stenosis. Severe bilateral neural foraminal stenosis. Visualized sacrum: Normal. IMPRESSION: 1. Large central disc extrusion at C3-4 causing severe spinal canal stenosis and compressing the spinal cord. Mildly increased T2-weighted signal within the spinal cord consistent with compressive myelopathy. 2. Large right subarticular disc protrusion at L1-L2 causing severe right lateral recess and severe central spinal canal stenosis. This has worsened compared to 04/21/202020. 3. Large central disc extrusion at L5-S1 with inferior migration causing mass effect on the left S1 nerve root in the lateral recess. Severe bilateral L5-S1 neural foraminal stenosis. Critical Value/emergent results were called by telephone at the time of interpretation on 06/14/2021 at 1:16 am to Dr. Florina Ou, Who verbally acknowledged these results. Electronically Signed   By: Ulyses Jarred M.D.   On: 06/14/2021 01:16   MR THORACIC SPINE WO CONTRAST  Result Date: 06/14/2021 CLINICAL DATA:  Upper and lower extremity weakness EXAM: MRI CERVICAL, THORACIC AND LUMBAR SPINE WITHOUT CONTRAST TECHNIQUE: Multiplanar and multiecho pulse sequences of the cervical spine, to include the craniocervical junction and cervicothoracic junction, and  thoracic and lumbar spine, were obtained without intravenous contrast. COMPARISON:  None. FINDINGS: MRI CERVICAL SPINE FINDINGS Alignment: Physiologic. Vertebrae: C4-7 ACDF Cord: Mildly increased T2-weighted signal within the spinal cord at the C3 level Posterior Fossa, vertebral arteries, paraspinal tissues: Negative Disc levels: At C3-4, there is a large central disc extrusion with superior migration causing severe spinal canal stenosis and compressing the spinal cord. There is no other cervical spinal canal stenosis or advance neural foraminal stenosis. MRI THORACIC SPINE FINDINGS Alignment:  Physiologic. Vertebrae: No fracture, evidence of discitis, or bone lesion. Cord:  Normal signal and morphology. Paraspinal and other soft tissues: Negative. Disc levels: T8-9 small central disc protrusion without stenosis. The other thoracic levels are unremarkable. MRI LUMBAR SPINE FINDINGS Segmentation:  Standard. Alignment:  Physiologic. Vertebrae: L2-5 PLIF with posterior decompression. No acute abnormality. Chronic height loss of L1. Conus medullaris and cauda equina: Conus extends to the L1 level. Paraspinal and other soft tissues: Left kidney is absent. Disc levels: T12-L1: Small left subarticular disc protrusion without spinal canal or neural foraminal stenosis. L1-L2: Large right subarticular disc protrusion causing severe narrowing of the right lateral recess and severe central spinal canal stenosis. This has worsened compared to 11/02/2018. No neural foraminal stenosis. L2-L3: Posterior fusion and decompression. No spinal canal stenosis. No neural foraminal stenosis. L3-L4:  Posterior fusion and decompression. No spinal canal stenosis. No neural foraminal stenosis. L4-L5: Posterior fusion decompression. No spinal canal stenosis. No neural foraminal stenosis. L5-S1: Large central disc extrusion with inferior migration causing mass effect on the left S1 nerve root in the lateral recess. No central spinal canal  stenosis. Severe bilateral neural foraminal stenosis. Visualized sacrum: Normal. IMPRESSION: 1. Large central disc extrusion at C3-4 causing severe spinal canal stenosis and compressing the spinal cord. Mildly increased T2-weighted signal within the spinal cord consistent with compressive myelopathy. 2. Large right subarticular disc protrusion at L1-L2 causing severe right lateral recess and severe central spinal canal stenosis. This has worsened compared to 04/21/202020. 3. Large central disc extrusion at L5-S1 with inferior migration causing mass effect on the left S1 nerve root in the lateral recess. Severe bilateral L5-S1 neural foraminal stenosis. Critical Value/emergent results were called by telephone at the time of interpretation on 06/14/2021 at 1:16 am to Dr. Florina Ou, Who verbally acknowledged these results. Electronically Signed   By: Ulyses Jarred M.D.   On: 06/14/2021 01:16   MR LUMBAR SPINE WO CONTRAST  Result Date: 06/14/2021 CLINICAL DATA:  Upper and lower extremity weakness EXAM: MRI CERVICAL, THORACIC AND LUMBAR SPINE WITHOUT CONTRAST TECHNIQUE: Multiplanar and multiecho pulse sequences of the cervical spine, to include the craniocervical junction and cervicothoracic junction, and thoracic and lumbar spine, were obtained without intravenous contrast. COMPARISON:  None. FINDINGS: MRI CERVICAL SPINE FINDINGS Alignment: Physiologic. Vertebrae: C4-7 ACDF Cord: Mildly increased T2-weighted signal within the spinal cord at the C3 level Posterior Fossa, vertebral arteries, paraspinal tissues: Negative Disc levels: At C3-4, there is a large central disc extrusion with superior migration causing severe spinal canal stenosis and compressing the spinal cord. There is no other cervical spinal canal stenosis or advance neural foraminal stenosis. MRI THORACIC SPINE FINDINGS Alignment:  Physiologic. Vertebrae: No fracture, evidence of discitis, or bone lesion. Cord:  Normal signal and morphology. Paraspinal and  other soft tissues: Negative. Disc levels: T8-9 small central disc protrusion without stenosis. The other thoracic levels are unremarkable. MRI LUMBAR SPINE FINDINGS Segmentation:  Standard. Alignment:  Physiologic. Vertebrae: L2-5 PLIF with posterior decompression. No acute abnormality. Chronic height loss of L1. Conus medullaris and cauda equina: Conus extends to the L1 level. Paraspinal and other soft tissues: Left kidney is absent. Disc levels: T12-L1: Small left subarticular disc protrusion without spinal canal or neural foraminal stenosis. L1-L2: Large right subarticular disc protrusion causing severe narrowing of the right lateral recess and severe central spinal canal stenosis. This has worsened compared to 11/02/2018. No neural foraminal stenosis. L2-L3: Posterior fusion and decompression. No spinal canal stenosis. No neural foraminal stenosis. L3-L4: Posterior fusion and decompression. No spinal canal stenosis. No neural foraminal stenosis. L4-L5: Posterior fusion decompression. No spinal canal stenosis. No neural foraminal stenosis. L5-S1: Large central disc extrusion with inferior migration causing mass effect on the left S1 nerve root in the lateral recess. No central spinal canal stenosis. Severe bilateral neural foraminal stenosis. Visualized sacrum: Normal. IMPRESSION: 1. Large central disc extrusion at C3-4 causing severe spinal canal stenosis and compressing the spinal cord. Mildly increased T2-weighted signal within the spinal cord consistent with compressive myelopathy. 2. Large right subarticular disc protrusion at L1-L2 causing severe right lateral recess and severe central spinal canal stenosis. This has worsened compared to 04/21/202020. 3. Large central disc extrusion at L5-S1 with inferior migration causing mass effect on the left S1 nerve root in the lateral recess. Severe bilateral L5-S1 neural foraminal stenosis. Critical Value/emergent results  were called by telephone at the time of  interpretation on 06/14/2021 at 1:16 am to Dr. Florina Ou, Who verbally acknowledged these results. Electronically Signed   By: Ulyses Jarred M.D.   On: 06/14/2021 01:16    Scheduled Meds:  benztropine  0.5 mg Oral BID   docusate sodium  100 mg Oral BID   escitalopram  10 mg Oral Daily   hydroxychloroquine  200 mg Oral BID   insulin aspart  0-9 Units Subcutaneous TID WC & HS   leflunomide  20 mg Oral Daily   metoprolol succinate  25 mg Oral Daily   OLANZapine  2.5 mg Oral QHS   polyethylene glycol  17 g Oral Daily   predniSONE  5 mg Oral Q breakfast    LOS: 1 day   Time spent: 76min  Kaedance Magos C Ori Kreiter, DO [facility]  If 7PM-7AM, please contact night-coverage [email or web address]  06/15/2021, 7:35 AM

## 2021-06-15 NOTE — Transfer of Care (Signed)
Immediate Anesthesia Transfer of Care Note  Patient: SELENA SWAMINATHAN  Procedure(s) Performed: ANTERIOR CERVICAL DECOMPRESSION/DISCECTOMY FUSION CERVICAL THREE-FOUR (Neck)  Patient Location: PACU  Anesthesia Type:General  Level of Consciousness: awake, alert  and oriented  Airway & Oxygen Therapy: Patient Spontanous Breathing and Patient connected to face mask oxygen  Post-op Assessment: Report given to RN, Post -op Vital signs reviewed and stable and Patient moving all extremities X 4  Post vital signs: Reviewed and stable  Last Vitals:  Vitals Value Taken Time  BP 178/65 06/15/21 1408  Temp    Pulse 116 06/15/21 1413  Resp 22 06/15/21 1413  SpO2 100 % 06/15/21 1413  Vitals shown include unvalidated device data.  Last Pain:  Vitals:   06/15/21 1123  TempSrc:   PainSc: 8       Patients Stated Pain Goal: 0 (72/76/18 4859)  Complications: No notable events documented.

## 2021-06-15 NOTE — Plan of Care (Signed)
Pt is alert oriented x 3. Pt c/o sharp pain to back and only tolerates laying on side. PRN dilaudid given per PRN order. Effective. Pt continues to complain of decreased sensation and numbness to bilateral arms and legs. Pt is not able to grip hands but is able to move fingers. Pt is able to move legs but has kept them in fetal position. When attempting to pull them out straight, pt pulls them  back in. Pt has been turned and repositioned in bed. Pt has purewick in place. Pt requires assistance with feeding.    Problem: Education: Goal: Knowledge of General Education information will improve Description: Including pain rating scale, medication(s)/side effects and non-pharmacologic comfort measures Outcome: Progressing   Problem: Health Behavior/Discharge Planning: Goal: Ability to manage health-related needs will improve Outcome: Progressing   Problem: Clinical Measurements: Goal: Ability to maintain clinical measurements within normal limits will improve Outcome: Progressing Goal: Will remain free from infection Outcome: Progressing Goal: Diagnostic test results will improve Outcome: Progressing Goal: Respiratory complications will improve Outcome: Progressing Goal: Cardiovascular complication will be avoided Outcome: Progressing   Problem: Activity: Goal: Risk for activity intolerance will decrease Outcome: Progressing   Problem: Nutrition: Goal: Adequate nutrition will be maintained Outcome: Progressing   Problem: Coping: Goal: Level of anxiety will decrease Outcome: Progressing   Problem: Elimination: Goal: Will not experience complications related to bowel motility Outcome: Progressing Goal: Will not experience complications related to urinary retention Outcome: Progressing   Problem: Pain Managment: Goal: General experience of comfort will improve Outcome: Progressing   Problem: Skin Integrity: Goal: Risk for impaired skin integrity will decrease Outcome:  Progressing

## 2021-06-15 NOTE — Anesthesia Procedure Notes (Signed)
Procedure Name: Intubation Date/Time: 06/15/2021 11:42 AM Performed by: Kyung Rudd, CRNA Pre-anesthesia Checklist: Patient identified, Emergency Drugs available, Suction available and Patient being monitored Patient Re-evaluated:Patient Re-evaluated prior to induction Oxygen Delivery Method: Circle system utilized Preoxygenation: Pre-oxygenation with 100% oxygen Induction Type: IV induction Ventilation: Mask ventilation without difficulty and Oral airway inserted - appropriate to patient size Laryngoscope Size: Glidescope and 3 Grade View: Grade II Tube type: Oral Tube size: 7.0 mm Number of attempts: 1 Airway Equipment and Method: Stylet and Video-laryngoscopy Placement Confirmation: ETT inserted through vocal cords under direct vision, positive ETCO2 and breath sounds checked- equal and bilateral Secured at: 20 cm Tube secured with: Tape Dental Injury: Teeth and Oropharynx as per pre-operative assessment

## 2021-06-15 NOTE — Anesthesia Preprocedure Evaluation (Signed)
Anesthesia Evaluation  Patient identified by MRN, date of birth, ID band Patient awake    Reviewed: Allergy & Precautions, NPO status , Patient's Chart, lab work & pertinent test results, reviewed documented beta blocker date and time   History of Anesthesia Complications Negative for: history of anesthetic complications  Airway Mallampati: III  TM Distance: >3 FB Neck ROM: Limited  Mouth opening: Limited Mouth Opening  Dental  (+) Dental Advisory Given, Partial Upper,    Pulmonary shortness of breath, neg sleep apnea, neg COPD, neg recent URI,    breath sounds clear to auscultation       Cardiovascular hypertension, Pt. on medications and Pt. on home beta blockers  Rhythm:Regular  1. Left ventricular ejection fraction, by estimation, is 60 to 65%. The  left ventricle has normal function. The left ventricle has no regional  wall motion abnormalities. Left ventricular diastolic parameters were  normal.  2. Right ventricular systolic function is normal. The right ventricular  size is normal.  3. The mitral valve is normal in structure. Trivial mitral valve  regurgitation.  4. The aortic valve is normal in structure. Aortic valve regurgitation is  mild. No aortic stenosis is present.    Neuro/Psych PSYCHIATRIC DISORDERS Anxiety Depression several weeks of gait instability and several days of progressively worsening upper extremity weakness now to the point of significant quadriparesis.  Imaging does reveal a large disc herniation with severe spinal cord compression at C3-4.  Given her relatively rapid recent decline  Neuromuscular disease    GI/Hepatic negative GI ROS, Neg liver ROS,   Endo/Other  diabetesHypothyroidism   Renal/GU Renal diseaseLab Results      Component                Value               Date                      CREATININE               1.07 (H)            06/15/2021                Musculoskeletal  (+)  Arthritis ,   Abdominal   Peds  Hematology Lab Results      Component                Value               Date                      WBC                      7.4                 06/15/2021                HGB                      10.5 (L)            06/15/2021                HCT                      34.0 (L)            06/15/2021  MCV                      97.1                06/15/2021                PLT                      137 (L)             06/15/2021              Anesthesia Other Findings   Reproductive/Obstetrics                             Anesthesia Physical Anesthesia Plan  ASA: 3  Anesthesia Plan: General   Post-op Pain Management: Ofirmev IV (intra-op)   Induction: Intravenous  PONV Risk Score and Plan: 3 and Ondansetron and Dexamethasone  Airway Management Planned: Oral ETT  Additional Equipment: None  Intra-op Plan:   Post-operative Plan: Extubation in OR  Informed Consent: I have reviewed the patients History and Physical, chart, labs and discussed the procedure including the risks, benefits and alternatives for the proposed anesthesia with the patient or authorized representative who has indicated his/her understanding and acceptance.     Dental advisory given  Plan Discussed with: CRNA and Anesthesiologist  Anesthesia Plan Comments:         Anesthesia Quick Evaluation

## 2021-06-16 LAB — CBC
HCT: 35.9 % — ABNORMAL LOW (ref 36.0–46.0)
Hemoglobin: 11.7 g/dL — ABNORMAL LOW (ref 12.0–15.0)
MCH: 30.8 pg (ref 26.0–34.0)
MCHC: 32.6 g/dL (ref 30.0–36.0)
MCV: 94.5 fL (ref 80.0–100.0)
Platelets: 147 K/uL — ABNORMAL LOW (ref 150–400)
RBC: 3.8 MIL/uL — ABNORMAL LOW (ref 3.87–5.11)
RDW: 13.5 % (ref 11.5–15.5)
WBC: 8.1 K/uL (ref 4.0–10.5)
nRBC: 0 % (ref 0.0–0.2)

## 2021-06-16 LAB — GLUCOSE, CAPILLARY
Glucose-Capillary: 135 mg/dL — ABNORMAL HIGH (ref 70–99)
Glucose-Capillary: 104 mg/dL — ABNORMAL HIGH (ref 70–99)
Glucose-Capillary: 98 mg/dL (ref 70–99)
Glucose-Capillary: 106 mg/dL — ABNORMAL HIGH (ref 70–99)

## 2021-06-16 LAB — BASIC METABOLIC PANEL WITH GFR
Anion gap: 8 (ref 5–15)
BUN: 18 mg/dL (ref 8–23)
CO2: 23 mmol/L (ref 22–32)
Calcium: 8.2 mg/dL — ABNORMAL LOW (ref 8.9–10.3)
Chloride: 107 mmol/L (ref 98–111)
Creatinine, Ser: 0.99 mg/dL (ref 0.44–1.00)
GFR, Estimated: 59 mL/min — ABNORMAL LOW
Glucose, Bld: 144 mg/dL — ABNORMAL HIGH (ref 70–99)
Potassium: 4.2 mmol/L (ref 3.5–5.1)
Sodium: 138 mmol/L (ref 135–145)

## 2021-06-16 NOTE — TOC Initial Note (Signed)
Transition of Care [name]) - Initial/Assessment Note    Patient Details  Name: [name] MRN: [id number] Date of Birth: 1945/08/09  Transition of Care [name]) CM/SW Contact:    [name], [place] Phone Number: 06/16/2021, 2:37 PM  Clinical Narrative:                 CSW was notified by RN that pt had concerns about being homeless after discharge. Per RN pt stating she lived at a group home that closed. CSW met with pt and her Brother at bedside. Pt oriented, but with some memory problems. Pt states that her concerns were regarding transportation back home. CSW clarified if she had concerns about her living situation as well, and she stated she wasn't sure if she could go back to her Group Home. CSW asked if she knew Julia name of Julia [name] as there is not one on file, and searches only pulled up a residence at Julia address provided. Both brother and pt stated they did not know Julia name, CSW asked if they had any contact information, pt stated she was hoping to get that information from [name]. Brother states [facility] is run by a husband and wife last name Leggit, he states he has contact information, but not on him. Brother says he is not convinced it Is a real GH and thinks it is just a couple who live there and he doesn't think they are able to take good care of her. Brother states that pt's sister [name] said they yell at her, pt was unable to remember if that was true or if she felt unsafe when there.  CSW asked if we had permission to contact [name] for more information. Pt said yes, but not today as she was at a wedding. CSW asked about pt returning to [name] and she states she doesn't know if she has any other choice, but doesn't know if she can go back. Pt states she has been there since June, but is unable to identify how she was placed there. CSW stated that we would talk to [name] and try to identify her living situation, and that we may need to make a report if necessary. Pt had concerns about a report  stating she was Julia only one that lives there, they would know it was her and then she wouldn't have anywhere to live. SW will speak with [name] for more collateral information. CSW noted that pt had not seen PT yet, but brother stated they were told she will need rehab, which seems likely. Pt has been to [name], [facility], and [facility] in Julia past. She stated she absolutely did not want to go back to [name] or [name], but enjoyed [name] and would be agreeable to going back, though she does not want to have to go. She and her brother note that she needs to do whatever is recommended to get better. Pt has had 2 vaccines and one booster for covid. Pt would like to stay in [name] area. CSW attempted to make a referral to [name] to assist family in Julia community, a voicemail was left. TOC will continue to follow for further needs.  Expected Discharge Plan: Skilled Nursing Facility Barriers to Discharge: Continued Medical Work up, Ship broker, SNF Pending bed offer, Family Issues, Unsafe home situation   Patient Goals and CMS Choice Patient states their goals for this hospitalization and ongoing recovery are:: Pt states her goal is to become more independent. CMS Medicare.gov Compare Post  Acute Care list provided to:: Patient Choice offered to / list presented to : Patient  Expected Discharge Plan and Services Expected Discharge Plan: [place] In-house Referral: Clinical Social Work   Post Acute Care Choice: [name] Living arrangements for Julia past 2 months: Group Home                                      Prior Living Arrangements/Services Living arrangements for Julia past 2 months: Group Home Lives with:: Facility Resident Patient language and need for interpreter reviewed:: Yes Do you feel safe going back to Julia place where you live?: No   certain of all of Julia details of Julia housing situation.  Need for Family Participation in Patient  Care: Yes (Comment) Care giver support system in place?: No (comment)   Criminal Activity/Legal Involvement Pertinent to Current Situation/Hospitalization: No - Comment as needed  Activities of Daily Living Home Assistive Devices/Equipment: CBG Meter, Eyeglasses, Dentures (specify type), Cane (specify quad or straight), Other (Comment), Grab bars in shower (upper partial plate, tub/shower unit, standard height toilet) ADL Screening (condition at time of admission) Patient's cognitive ability adequate to safely complete daily activities?: Yes Is Julia patient deaf or have difficulty hearing?: No Does Julia patient have difficulty seeing, even when wearing glasses/contacts?: No Does Julia patient have difficulty concentrating, remembering, or making decisions?: Yes Patient able to express need for assistance with ADLs?: Yes Does Julia patient have difficulty dressing or bathing?: Yes (secondary to pain and notbeing able to walk) Independently performs ADLs?: No (secondary to pain and notbeing able to walk) Communication: Independent Dressing (OT): Needs assistance Is this a change from baseline?: Change from baseline, expected to last >3 days Grooming: Needs assistance Is this a change from baseline?: Change from baseline, expected to last >3 days Feeding: Needs assistance Is this a change from baseline?: Change from baseline, expected to last >3 days Bathing: Needs assistance Is this a change from baseline?: Change from baseline, expected to last >3 days Toileting: Dependent Is this a change from baseline?: Change from baseline, expected to last >3days In/Out Bed: Dependent Is this a change from baseline?: Change from baseline, expected to last >3 days Walks in Home: Dependent Is this a change from baseline?: Change from baseline, expected to last >3 days Does Julia patient have difficulty walking or climbing stairs?: Yes (secondary to pain and notbeing able to walk) Weakness of Legs:  Both Weakness of Arms/Hands: None  Permission Sought/Granted Permission sought to share information with : Family Supports Permission granted to share information with : Yes, Verbal Permission Granted  Share Information with NAME: Telford Nab     Permission granted to share info w Relationship: sister  Permission granted to share info w Contact Information: [phone]  Emotional Assessment Appearance:: Appears stated age Attitude/Demeanor/Rapport: Engaged Affect (typically observed): Appropriate Orientation: : Oriented to Self, Oriented to Place, Oriented to  Time, Oriented to Situation Alcohol / Substance Use: Not Applicable Psych Involvement: No (comment)  Admission diagnosis:  Anxiety [F41.9] Cervical myelopathy ([place]) [G95.9] Cervical stenosis of spinal canal [M48.02] Spinal stenosis of lumbar region with neurogenic claudication [M48.062] Fall, initial encounter [W19.XXXA] Patient Active Problem List   Diagnosis Date Noted   Cervical myelopathy ([place]) 06/14/2021   Anxiety    Stage 3a chronic kidney disease (CKD) ([place])    Cervical stenosis of spinal canal    Hyperlipidemia associated with  type 2 diabetes mellitus ([place]) 03/14/2021   Acute respiratory failure with hypoxia ([place]) 01/01/2021   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   Cognitive impairment 04/17/2019   Degeneration of lumbar intervertebral disc 10/15/2018   MDD (major depressive disorder), recurrent episode, severe ([place]) 08/10/2018   MDD (major depressive disorder), recurrent, severe, with psychosis ([place])    Altered mental status 05/24/2018   Ketonuria 05/24/2018   Pain in right hand 09/11/2017   Hypertension 03/07/2014   Hyperlipidemia 03/07/2014   Depression 03/07/2014   Spinal stenosis of lumbar region with neurogenic claudication 02/24/2014   Type 2 diabetes mellitus without complication ([place]) 65/82/6088   Rheumatoid arthritis ([place]) 02/24/2014   Scoliosis of lumbar spine 02/13/2014   PCP:  Aretta Nip,  MD Pharmacy:  No Pharmacies Listed    Social Determinants of Health (SDOH) Interventions    Readmission Risk Interventions No flowsheet data found.

## 2021-06-16 NOTE — Progress Notes (Signed)
PROGRESS NOTE    [name]  OHY:[id number] DOB: [birth year] DOA: 06/13/2021 PCP: Aretta Nip, MD  Brief Narrative:  [name] is a pleasant 75 yo female previously living at a group home who closed and moved in with friends recently with medical history significant for depression, anxiety, cognitive impairment, hypertension, rheumatoid arthritis, and diabetes mellitus, now presenting to the emergency department for evaluation of extremity numbness, difficulty ambulating, and recurrent falls with several days of difficulty ambulating and has complained of numbness and tingling in her bilateral hands and lower extremities.  Neurosurgery consulted at intake.  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; lumbar spinal stenosis, POA - Presents with worsening bilateral upper and lower extremity weakness with recurrent falls - Status post discectomy and C3-4 decompression of spinal cord on 06/15/2021 with Medtronic 6 mm cage and bone allograft - Tolerated procedure quite well, pain currently well controlled - Range of motion and strength appears to be improving from yesterday as a sensation but not yet back to baseline, patient still has left upper extremity weakness and right arm paresthesias ongoing  Depression, anxiety, cognitive impairment  - Continue home medications including Lexapro, Zyprexa   CKD IIIa, at baseline - Continue to follow   Rheumatoid arthritis  - Pharmacy medication-reconciliation pending     Hypertension  - Pharmacy medication reconciliation pending - Treat as needed for now    Hx of type II DM  - A1c 5.4% - diet-controlled - Monitor, use low-intensity SSI if needed    DVT prophylaxis: SCDs  Code Status: Full  Family Communication: Sister updated over the phone  Status is: Inpatient  Dispo: The patient is from: Home              Anticipated d/c is to: To be determined              Anticipated d/c  date is: 48 to 72 hours pending clinical course and postsurgical recovery              Patient currently not medically stable for discharge  Consultants:  Neurosurgery  Procedures:  06/15/21: Discectomy at C3-4 for decompression of spinal cord and exiting nerve roots; Placement of intervertebral biomechanical device Medtronic zero-profile 52mm cage, 65mm screws; use of morselized bone allograft   Antimicrobials:  Perioperatively  Subjective: No acute issues or events overnight, pain currently well controlled denies nausea vomiting diarrhea constipation headache fevers chills or chest pain  Objective: Vitals:   06/15/21 1520 06/15/21 2005 06/15/21 2346 06/16/21 0416  BP: (!) 153/56 (!) 147/65 (!) 142/73 (!) 158/98  Pulse: (!) 114 (!) 104 95 99  Resp: 14 18 17 17   Temp: 98.4 F (36.9 C) 98 F (36.7 C) 97.8 F (36.6 C) (!) 97.5 F (36.4 C)  TempSrc: Oral Oral Oral Oral  SpO2: 93% 94% 95% 96%  Weight:      Height:        Intake/Output Summary (Last 24 hours) at 06/16/2021 0751 Last data filed at 06/15/2021 2137 Gross per 24 hour  Intake 950 ml  Output 600 ml  Net 350 ml    Filed Weights   06/13/21 1313  Weight: 77.1 kg    Examination:  General:  Pleasantly resting in bed, No acute distress. HEENT:  Normocephalic atraumatic.  Sclerae nonicteric, noninjected.  Extraocular movements intact bilaterally. Neck: Anterior neck bandage clean dry intact Lungs:  Clear to auscultate bilaterally without rhonchi, wheeze, or rales. Heart:  Regular rate  and rhythm.  Without murmurs, rubs, or gallops. Abdomen:  Soft, nontender, nondistended.  Without guarding or rebound. Extremities: Right upper extremity 4 out of 5 strength left upper extremity 3 out of 3, diffuse paresthesias bilateral upper extremities ongoing Vascular:  Dorsalis pedis and posterior tibial pulses palpable bilaterally. Skin:  Warm and dry, no erythema, no ulcerations.  Data Reviewed: I have personally reviewed  following labs and imaging studies  CBC: Recent Labs  Lab 06/13/21 1304 06/14/21 0445 06/15/21 0043 06/16/21 0152  WBC 10.2 7.7 7.4 8.1  NEUTROABS 8.9*  --   --   --   HGB 11.9* 11.5* 10.5* 11.7*  HCT 38.0 36.4 34.0* 35.9*  MCV 97.7 98.6 97.1 94.5  PLT 166 141* 137* 147*    Basic Metabolic Panel: Recent Labs  Lab 06/13/21 1304 06/14/21 0445 06/15/21 0043 06/16/21 0152  NA 139 138 137 138  K 4.4 3.6 3.9 4.2  CL 106 107 105 107  CO2 24 24 23 23   GLUCOSE 109* 84 92 144*  BUN 22 18 16 18   CREATININE 1.13* 0.99 1.07* 0.99  CALCIUM 8.3* 7.9* 7.7* 8.2*    GFR: Estimated Creatinine Clearance: 48.3 mL/min (by C-G formula based on SCr of 0.99 mg/dL). Liver Function Tests: Recent Labs  Lab 06/13/21 1304  AST 29  ALT 19  ALKPHOS 100  BILITOT 0.6  PROT 7.0  ALBUMIN 3.8    No results for input(s): LIPASE, AMYLASE in the last 168 hours. Recent Labs  Lab 06/13/21 1420  AMMONIA 18    Coagulation Profile: No results for input(s): INR, PROTIME in the last 168 hours. Cardiac Enzymes: No results for input(s): CKTOTAL, CKMB, CKMBINDEX, TROPONINI in the last 168 hours. BNP (last 3 results) No results for input(s): PROBNP in the last 8760 hours. HbA1C: Recent Labs    06/14/21 2112  HGBA1C 5.4    CBG: Recent Labs  Lab 06/15/21 0610 06/15/21 1025 06/15/21 1735 06/15/21 2108 06/16/21 0615  GLUCAP 104* 95 181* 168* 135*    Lipid Profile: No results for input(s): CHOL, HDL, LDLCALC, TRIG, CHOLHDL, LDLDIRECT in the last 72 hours. Thyroid Function Tests: Recent Labs    06/13/21 1315  TSH 0.358    Anemia Panel: No results for input(s): VITAMINB12, FOLATE, FERRITIN, TIBC, IRON, RETICCTPCT in the last 72 hours. Sepsis Labs: No results for input(s): PROCALCITON, LATICACIDVEN in the last 168 hours.  Recent Results (from the past 240 hour(s))  Resp Panel by RT-PCR (Flu A&B, Covid) Nasopharyngeal Swab     Status: None   Collection Time: 06/13/21  2:27 PM    Specimen: Nasopharyngeal Swab; Nasopharyngeal(NP) swabs in vial transport medium  Result Value Ref Range Status   SARS Coronavirus 2 by RT PCR NEGATIVE NEGATIVE Final    Comment: (NOTE) SARS-CoV-2 target nucleic acids are NOT DETECTED.  The SARS-CoV-2 RNA is generally detectable in upper respiratory specimens during the acute phase of infection. The lowest concentration of SARS-CoV-2 viral copies this assay can detect is 138 copies/mL. A negative result does not preclude SARS-Cov-2 infection and should not be used as the sole basis for treatment or other patient management decisions. A negative result may occur with  improper specimen collection/handling, submission of specimen other than nasopharyngeal swab, presence of viral mutation(s) within the areas targeted by this assay, and inadequate number of viral copies(<138 copies/mL). A negative result must be combined with clinical observations, patient history, and epidemiological information. The expected result is Negative.  Fact Sheet for Patients:  EntrepreneurPulse.com.au  Fact  Sheet for Healthcare Providers:  IncredibleEmployment.be  This test is no t yet approved or cleared by the [place] FDA and  has been authorized for detection and/or diagnosis of SARS-CoV-2 by FDA under an Emergency Use Authorization (EUA). This EUA will remain  in effect (meaning this test can be used) for the duration of the COVID-19 declaration under Section 564(b)(1) of the Act, 21 U.S.C.section 360bbb-3(b)(1), unless the authorization is terminated  or revoked sooner.       Influenza A by PCR NEGATIVE NEGATIVE Final   Influenza B by PCR NEGATIVE NEGATIVE Final    Comment: (NOTE) The Xpert Xpress SARS-CoV-2/FLU/RSV plus assay is intended as an aid in the diagnosis of influenza from Nasopharyngeal swab specimens and should not be used as a sole basis for treatment. Nasal washings and aspirates are  unacceptable for Xpert Xpress SARS-CoV-2/FLU/RSV testing.  Fact Sheet for Patients: EntrepreneurPulse.com.au  Fact Sheet for Healthcare Providers: IncredibleEmployment.be  This test is not yet approved or cleared by the [place] FDA and has been authorized for detection and/or diagnosis of SARS-CoV-2 by FDA under an Emergency Use Authorization (EUA). This EUA will remain in effect (meaning this test can be used) for the duration of the COVID-19 declaration under Section 564(b)(1) of the Act, 21 U.S.C. section 360bbb-3(b)(1), unless the authorization is terminated or revoked.  Performed at [facility], [place] [address], [place], [place] [address]   Surgical pcr screen     Status: Abnormal   Collection Time: 06/15/21 11:13 AM   Specimen: Nasal Mucosa; Nasal Swab  Result Value Ref Range Status   MRSA, PCR NEGATIVE NEGATIVE Final   Staphylococcus aureus POSITIVE (A) NEGATIVE Final    Comment: (NOTE) The Xpert SA Assay (FDA approved for NASAL specimens in patients 11 years of age and older), is one component of a comprehensive surveillance program. It is not intended to diagnose infection nor to guide or monitor treatment. Performed at [facility], [place] [address], [place], [place] [address]           Radiology Studies: DG Cervical Spine 1 View  Result Date: 06/15/2021 CLINICAL DATA:  ACDF EXAM: DG CERVICAL SPINE - 1 VIEW COMPARISON:  None. FINDINGS: Post ACDF at C3-C4. Partially imaged prior ACDF extending inferiorly from C4. IMPRESSION: C3-C4 ACDF. Electronically Signed   By: Macy Mis M.D.   On: 06/15/2021 14:46   DG C-Arm 1-60 Min-No Report  Result Date: 06/15/2021 Fluoroscopy was utilized by the requesting physician.  No radiographic interpretation.   DG C-Arm 1-60 Min-No Report  Result Date: 06/15/2021 Fluoroscopy was utilized by the requesting physician.  No radiographic interpretation.     Scheduled Meds:  benztropine  0.5 mg Oral BID   docusate sodium  100 mg Oral BID   escitalopram  10 mg Oral Daily   hydroxychloroquine  200 mg Oral BID   insulin aspart  0-9 Units Subcutaneous TID WC & HS   leflunomide  20 mg Oral Daily   metoprolol succinate  25 mg Oral Daily   OLANZapine  2.5 mg Oral QHS   polyethylene glycol  17 g Oral Daily   predniSONE  5 mg Oral Q breakfast    LOS: 2 days   Time spent: 45min  Syretta Kochel C Sherie Dobrowolski, DO [facility]  If 7PM-7AM, please contact night-coverage [email or web address]  06/16/2021, 7:51 AM

## 2021-06-16 NOTE — Progress Notes (Signed)
  NEUROSURGERY PROGRESS NOTE   No issues overnight. Has appropriate neck pain  EXAM:  BP 113/88   Pulse 100   Temp 98.8 F (37.1 C) (Oral)   Resp 16   Ht 5\' 3"  (1.6 m)   Wt 77.1 kg   SpO2 95%   BMI 30.11 kg/m   Awake, alert Speech fluent CN grossly intact  Motor exam: Upper Extremities Deltoid Bicep Tricep Grip  Right 4/5 4/5 4/5 3/5  Left 3/5 2/5 2/5 1/5    Lower Extremities IP Quad PF DF EHL  Right 4-/5 3/5 4/5 4/5 4/5  Left 4-/5 3/5 4/5 4/5 4/5    IMPRESSION:  75 y.o. female POD#1 C3-4 ACDF, mild improvement in motor exam.  PLAN: - Start PT/OT today - Cont supportive care   Consuella Lose, MD [facility]

## 2021-06-17 ENCOUNTER — Encounter (HOSPITAL_COMMUNITY)
Admission: EM | Disposition: A | Discharge status: Skilled Nursing Facility | Payer: Self-pay | Source: Home / Self Care | Attending: Internal Medicine

## 2021-06-17 LAB — CBC
HCT: 34.8 % — ABNORMAL LOW (ref 36.0–46.0)
Hemoglobin: 11.1 g/dL — ABNORMAL LOW (ref 12.0–15.0)
MCH: 30.2 pg (ref 26.0–34.0)
MCHC: 31.9 g/dL (ref 30.0–36.0)
MCV: 94.6 fL (ref 80.0–100.0)
Platelets: 146 10*3/uL — ABNORMAL LOW (ref 150–400)
RBC: 3.68 MIL/uL — ABNORMAL LOW (ref 3.87–5.11)
RDW: 13.8 % (ref 11.5–15.5)
WBC: 8.6 10*3/uL (ref 4.0–10.5)
nRBC: 0 % (ref 0.0–0.2)

## 2021-06-17 LAB — BASIC METABOLIC PANEL
Anion gap: 9 (ref 5–15)
BUN: 20 mg/dL (ref 8–23)
CO2: 25 mmol/L (ref 22–32)
Calcium: 8.3 mg/dL — ABNORMAL LOW (ref 8.9–10.3)
Chloride: 105 mmol/L (ref 98–111)
Creatinine, Ser: 0.97 mg/dL (ref 0.44–1.00)
GFR, Estimated: >60 mL/min (ref >60)
Glucose, Bld: 87 mg/dL (ref 70–99)
Potassium: 3.7 mmol/L (ref 3.5–5.1)
Sodium: 139 mmol/L (ref 135–145)

## 2021-06-17 LAB — GLUCOSE, CAPILLARY
Glucose-Capillary: 104 mg/dL — ABNORMAL HIGH (ref 70–99)
Glucose-Capillary: 83 mg/dL (ref 70–99)
Glucose-Capillary: 111 mg/dL — ABNORMAL HIGH (ref 70–99)
Glucose-Capillary: 109 mg/dL — ABNORMAL HIGH (ref 70–99)
Glucose-Capillary: 96 mg/dL (ref 70–99)

## 2021-06-17 SURGERY — ANTERIOR CERVICAL DECOMPRESSION/DISCECTOMY FUSION 1 LEVEL
Anesthesia: Choice

## 2021-06-17 MED ORDER — CHLORHEXIDINE GLUCONATE CLOTH 2 % EX PADS: 6.0000 | MEDICATED_PAD | Freq: Every day | CUTANEOUS | Status: DC

## 2021-06-17 MED ORDER — MUPIROCIN 2 % EX OINT
1.0000 "application " | TOPICAL_OINTMENT | Freq: Two times a day (BID) | CUTANEOUS | Status: DC
  Administered 2021-06-17 – 2021-06-19 (×4): 1 via NASAL
  Filled 2021-06-17 (×2): qty 22

## 2021-06-17 NOTE — NC FL2 (Signed)
[place] LEVEL OF CARE SCREENING TOOL     IDENTIFICATION  Patient Name: [name] Birthdate: 1946/03/18 Sex: female Admission Date (Current Location): 06/13/2021  [facility] and [place] Number:  Herbalist and Address:  [facility], [place] [address] [place], [place] [address]      Provider Number: [id number]  Attending Physician Name and Address:  Little Ishikawa, MD  Relative Name and Phone Number:  Telford Nab [phone]    Current Level of Care: Hospital Recommended Level of Care: [place] Prior Approval Number:    Date Approved/Denied:   PASRR Number: [id number]  Discharge Plan: SNF    Current Diagnoses: Patient Active Problem List   Diagnosis Date Noted   Cervical myelopathy ([place]) 06/14/2021   Anxiety    Stage 3a chronic kidney disease (CKD) ([place])    Cervical stenosis of spinal canal    Hyperlipidemia associated with type 2 diabetes mellitus ([place]) 03/14/2021   Acute respiratory failure with hypoxia ([place]) 01/01/2021   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   Cognitive impairment 04/17/2019   Degeneration of lumbar intervertebral disc 10/15/2018   MDD (major depressive disorder), recurrent episode, severe ([place]) 08/10/2018   MDD (major depressive disorder), recurrent, severe, with psychosis ([place])    Altered mental status 05/24/2018   Ketonuria 05/24/2018   Pain in right hand 09/11/2017   Hypertension 03/07/2014   Hyperlipidemia 03/07/2014   Depression 03/07/2014   Spinal stenosis of lumbar region with neurogenic claudication 02/24/2014   Type 2 diabetes mellitus without complication ([place]) 15/17/6160   Rheumatoid arthritis ([place]) 02/24/2014   Scoliosis of lumbar spine 02/13/2014    Orientation RESPIRATION BLADDER Height & Weight     Self, Time, Situation, Place  Normal Continent Weight: 170 lb (77.1 kg) Height:  5\' 3"  (160 cm)  BEHAVIORAL SYMPTOMS/MOOD NEUROLOGICAL BOWEL NUTRITION STATUS       Continent Diet (see DC summary)  AMBULATORY STATUS COMMUNICATION OF NEEDS Skin   Extensive Assist Verbally Surgical wounds (closed neck, honeycomb dressing)                       Personal Care Assistance Level of Assistance  Bathing, Feeding, Dressing Bathing Assistance: Maximum assistance Feeding assistance: Maximum assistance Dressing Assistance: Maximum assistance     Functional Limitations Info  Sight Sight Info: Impaired        SPECIAL CARE FACTORS FREQUENCY  PT (By licensed PT), OT (By licensed OT)     PT Frequency: 5x/wk OT Frequency: 5x/wk            Contractures Contractures Info: Not present    Additional Factors Info  Code Status, Allergies, Psychotropic Code Status Info: Full Allergies Info: Contrast Media (Iodinated Diagnostic Agents), Abilify (Aripiprazole), Ultram (Tramadol Hcl), Zithromax (Azithromycin) Psychotropic Info: Lexapro 10mg  daily; Zyprexa 2.5mg  daily at bed         Current Medications (06/17/2021):  This is the current hospital active medication list Current Facility-Administered Medications  Medication Dose Route Frequency Provider Last Rate Last Admin   acetaminophen (TYLENOL) tablet 650 mg  650 mg Oral Q6H PRN Opyd, Ilene Qua, MD   650 mg at 06/17/21 1218   Or   acetaminophen (TYLENOL) suppository 650 mg  650 mg Rectal Q6H PRN Opyd, Ilene Qua, MD       benztropine (COGENTIN) tablet 0.5 mg  0.5 mg Oral BID Lavina Hamman, MD   0.5 mg at 06/17/21 1154   docusate sodium (  COLACE) capsule 100 mg  100 mg Oral BID Lavina Hamman, MD   100 mg at 06/17/21 1218   escitalopram (LEXAPRO) tablet 10 mg  10 mg Oral Daily Lavina Hamman, MD   10 mg at 06/17/21 1154   haloperidol lactate (HALDOL) injection 2 mg  2 mg Intravenous Q6H PRN Lavina Hamman, MD   2 mg at 06/14/21 1149   HYDROmorphone (DILAUDID) injection 0.5 mg  0.5 mg Intravenous Q3H PRN Lavina Hamman, MD   0.5 mg at 06/15/21 0335   hydroxychloroquine (PLAQUENIL) tablet 200 mg  200  mg Oral BID Lavina Hamman, MD   200 mg at 06/17/21 1154   insulin aspart (novoLOG) injection 0-9 Units  0-9 Units Subcutaneous TID WC & HS Chotiner, Yevonne Aline, MD   1 Units at 06/16/21 0701   leflunomide (ARAVA) tablet 20 mg  20 mg Oral Daily Lavina Hamman, MD   20 mg at 06/17/21 1154   metoprolol succinate (TOPROL-XL) 24 hr tablet 25 mg  25 mg Oral Daily Lavina Hamman, MD   25 mg at 06/17/21 1218   OLANZapine (ZYPREXA) tablet 2.5 mg  2.5 mg Oral QHS Lavina Hamman, MD   2.5 mg at 06/16/21 2136   polyethylene glycol (MIRALAX / GLYCOLAX) packet 17 g  17 g Oral Daily Lavina Hamman, MD   17 g at 06/17/21 1154   predniSONE (DELTASONE) tablet 5 mg  5 mg Oral Q breakfast Lavina Hamman, MD   5 mg at 06/17/21 0941   senna-docusate (Senokot-S) tablet 1 tablet  1 tablet Oral QHS PRN Opyd, Ilene Qua, MD         Discharge Medications: Please see discharge summary for a list of discharge medications.  Relevant Imaging Results:  Relevant Lab Results:   Additional Information SS#: [id number]  Geralynn Ochs, LCSW

## 2021-06-17 NOTE — Plan of Care (Signed)

## 2021-06-17 NOTE — Progress Notes (Signed)
Occupational Therapy Treatment Patient Details Name: [name] MRN: [id number] DOB: July 24, 1945 Today's Date: 06/17/2021   History of present illness 75 y.o. female admitted 12/1 for requent falls and bilat hand numbness, she is now  C3-4 ACDF 12/3. PMH: medical history significant for depression, anxiety, cognitive impairment, hypertension, rheumatoid arthritis, and diabetes mellitus   OT comments  Second session today due to RN staff requesting assistance to transfer pt back to bed. RN attempting to use the steady but unsuccessful. Ultimately pt continues to require max A +2 hand hold assistance and simple one step verbal cues. Spent time going over cervical precautions for transfers and bed mobility. Also gave pt a built up foam handle for utensils to increase ability to self feed. Pt will continue to benefit from OT acutely. D/c recommendation remains appropriate.    Recommendations for follow up therapy are one component of a multi-disciplinary discharge planning process, led by the attending physician.  Recommendations may be updated based on patient status, additional functional criteria and insurance authorization.    Follow Up Recommendations  Skilled nursing-short term rehab (<3 hours/day)    Assistance Recommended at Discharge Frequent or constant Supervision/Assistance  Equipment Recommendations  BSC/3in1    Recommendations for Other Services      Precautions / Restrictions Precautions Precautions: Fall;Cervical Precaution Booklet Issued: No Precaution Comments: verbal cues during functional tasks Restrictions Weight Bearing Restrictions: No       Mobility Bed Mobility Overal bed mobility: Needs Assistance Bed Mobility: Sit to Sidelying;Rolling Rolling: Mod assist       Sit to sidelying: Max assist General bed mobility comments: max A to manage BLE, and for trunk control    Transfers Overall transfer level: Needs assistance Equipment used: 2 person  hand held assist Transfers: Sit to/from Stand;Bed to chair/wheelchair/BSC Sit to Stand: Max assist;+2 physical assistance;+2 safety/equipment Stand pivot transfers: Max assist;+2 physical assistance;+2 safety/equipment         General transfer comment: max A +2 to power into standing with bilat knees blocked. pt with extreme fear of falling and benefits from simple one step verbal cues to sequence through task. Incrased buckling noted this session     Balance Overall balance assessment: Needs assistance;History of Falls Sitting-balance support: Feet supported Sitting balance-Leahy Scale: Poor     Standing balance support: Bilateral upper extremity supported Standing balance-Leahy Scale: Poor Standing balance comment: reliant on +2 assist                           ADL either performed or assessed with clinical judgement   ADL Overall ADL's : Needs assistance/impaired Eating/Feeding: Moderate assistance;Sitting Eating/Feeding Details (indicate cue type and reason): pt given red built up foam handle to increase ability to self feed                     Toilet Transfer: Maximal assistance;+2 for physical assistance;+2 for safety/equipment;Stand-pivot Toilet Transfer Details (indicate cue type and reason): simulated from chair>bed. pt continues to benefit from simple one step cues to manage fear of falling and eliminate impulsivity         Functional mobility during ADLs: Maximal assistance;+2 for physical assistance;+2 for safety/equipment General ADL Comments: second session spent assisting pt back to bed; pt more fatigued this session with increased buckling noted    Extremity/Trunk Assessment Upper Extremity Assessment RUE Deficits / Details: shoulder ROM to 90, elbow, wrist and hand ROM are WFL. generally weak with poor  grip strength. Poor sensory motor control noted throughout with some tremorus movement observed RUE Sensation: decreased light  touch;decreased proprioception RUE Coordination: decreased fine motor;decreased gross motor LUE Deficits / Details: shoulder ROM to 90, elbow, wrist and hand ROM are WFL. generally weak with poor grip strength. Left weaker than R. Poor sensory motor control noted throughout with some tremorus movement observed LUE Sensation: decreased light touch;decreased proprioception LUE Coordination: decreased fine motor;decreased gross motor   Lower Extremity Assessment Lower Extremity Assessment: Defer to PT evaluation        Vision   Vision Assessment?: No apparent visual deficits   Perception     Praxis      Cognition Arousal/Alertness: Awake/alert Behavior During Therapy: Anxious;Impulsive Overall Cognitive Status: History of cognitive impairments - at baseline                                 General Comments: pt extremely anxious throughout session with fear of falling. Pt benefits from simple one step verbal cues to sequence through all functional tasks. Pt often stating "I can't do it" while doing the task asked of her. Per pt history, she has baseline cognitive impairment, no family present to obtain baseline          Exercises     Shoulder Instructions       General Comments VSS on RA - RN and charge RN in room attempting to transfer pt back to bed upon arrival.    Pertinent Vitals/ Pain       Pain Assessment: Faces Faces Pain Scale: Hurts a little bit Pain Location: neck, generalized Pain Intervention(s): Monitored during session  Home Living                                          Prior Functioning/Environment              Frequency  Min 2X/week        Progress Toward Goals  OT Goals(current goals can now be found in the care plan section)  Progress towards OT goals: Progressing toward goals  Acute Rehab OT Goals OT Goal Formulation: With patient Time For Goal Achievement: 07/01/21 Potential to Achieve Goals:  Good ADL Goals Pt Will Perform Eating: with set-up;sitting Pt Will Perform Grooming: with set-up;sitting Pt Will Perform Upper Body Dressing: with set-up;sitting Pt Will Perform Lower Body Dressing: with min assist;sit to/from stand Pt Will Transfer to Toilet: with min assist;ambulating  Plan Discharge plan remains appropriate    Co-evaluation                 AM-PAC OT "6 Clicks" Daily Activity     Outcome Measure   Help from another person eating meals?: A Lot Help from another person taking care of personal grooming?: A Lot Help from another person toileting, which includes using toliet, bedpan, or urinal?: A Lot Help from another person bathing (including washing, rinsing, drying)?: A Lot Help from another person to put on and taking off regular upper body clothing?: A Lot Help from another person to put on and taking off regular lower body clothing?: A Lot 6 Click Score: 12    End of Session Equipment Utilized During Treatment: Gait belt  OT Visit Diagnosis: Unsteadiness on feet (R26.81);Other abnormalities of gait and mobility (R26.89);Repeated falls (R29.6);Muscle weakness (generalized) (M62.81);History  of falling (Z91.81);Pain;Other symptoms and signs involving cognitive function   Activity Tolerance Patient tolerated treatment well   Patient Left in bed;with call bell/phone within reach;with bed alarm set;with nursing/sitter in room   Nurse Communication Mobility status        Time: 1141-1156 OT Time Calculation (min): 15 min  Charges: OT General Charges $OT Visit: 1 Visit OT Treatments $Therapeutic Activity: 8-22 mins   Elizer Bostic A Arshi Duarte 06/17/2021, 2:18 PM

## 2021-06-17 NOTE — Care Management Important Message (Signed)
Important Message  Patient Details  Name: [name] MRN: [id number] Date of Birth: 15-May-1946   Medicare Important Message Given:  Yes     Orbie Pyo 06/17/2021, 3:08 PM

## 2021-06-17 NOTE — Progress Notes (Signed)
  NEUROSURGERY PROGRESS NOTE   No issues overnight. Has appropriate neck pain. Mild dysphagia but tolerating diet.  EXAM:  BP (!) 159/76   Pulse (!) 103   Temp 98.5 F (36.9 C) (Oral)   Resp 16   Ht 5\' 3"  (1.6 m)   Wt 77.1 kg   SpO2 95%   BMI 30.11 kg/m   Awake, alert Speech fluent CN grossly intact  Motor exam: Upper Extremities Deltoid Bicep Tricep Grip  Right 4+/5 4+/5 4+/5 4/5  Left 4-/5 4/5 4/5 3/5    Lower Extremities IP Quad PF DF EHL  Right 4+/5 4/5 4/5 4/5 4/5  Left 4+/5 4/5 4/5 4/5 4/5    IMPRESSION:  75 y.o. female POD#2 C3-4 ACDF, marked improvement in motor exam.  PLAN: - Cont  PT/OT  - Cont supportive care, will likely need SNF v CIR   Consuella Lose, MD [facility]

## 2021-06-17 NOTE — Evaluation (Signed)
Physical Therapy Evaluation Patient Details Name: [name] MRN: [id number] DOB: 08-31-1945 Today's Date: 06/17/2021  History of Present Illness  75 y.o. female admitted 12/1 for requent falls and bilat hand numbness, she is now  C3-4 ACDF 12/3. PMH: medical history significant for depression, anxiety, cognitive impairment, hypertension, rheumatoid arthritis, and diabetes mellitus   Clinical Impression  Pt admitted with above. Pt with noted memory deficits, severe anxiety re: fear of falling, shakiness/tremors, neck pain, impaired comprehension of situation and cervical precautions, and currently requires assistx2 for safe OOB Mobility. Pt with unclear home situation and support available. Pt to benefit from SNF upon d/c to maximize functional recovery and to progress back to independence. Acute PT to cont to follow.       Recommendations for follow up therapy are one component of a multi-disciplinary discharge planning process, led by the attending physician.  Recommendations may be updated based on patient status, additional functional criteria and insurance authorization.  Follow Up Recommendations Skilled nursing-short term rehab (<3 hours/day)    Assistance Recommended at Discharge Frequent or constant Supervision/Assistance  Functional Status Assessment Patient has had a recent decline in their functional status and/or demonstrates limited ability to make significant improvements in function in a reasonable and predictable amount of time  Equipment Recommendations   (TBD at next venue)    Recommendations for Other Services       Precautions / Restrictions Precautions Precautions: Fall;Cervical Precaution Booklet Issued: No Precaution Comments: verbal cues during functional tasks, pt with poor memory and unable to comperhend or adhere to neck precautions Restrictions Weight Bearing Restrictions: No      Mobility  Bed Mobility Overal bed mobility: Needs Assistance Bed  Mobility: Rolling;Sidelying to Sit Rolling: Mod assist Sidelying to sit: Mod assist     Sit to sidelying: Max assist General bed mobility comments: max direcitonal verbal cues and encouragement, modA for LE management and trunk elevation    Transfers Overall transfer level: Needs assistance Equipment used: 2 person hand held assist Transfers: Sit to/from Stand;Bed to chair/wheelchair/BSC Sit to Stand: Max assist;+2 physical assistance;+2 safety/equipment Stand pivot transfers: Max assist;+2 physical assistance         General transfer comment: max A +2 to power into standing with bilat knees blocked. pt with extreme fear of falling and benefits from simple one step verbal cues to sequence through task, bilat LE instability noted however no buckling with either std pvt transfer to [facility] or to chair    Ambulation/Gait               General Gait Details: unable this date  Stairs            Wheelchair Mobility    Modified Rankin (Stroke Patients Only)       Balance Overall balance assessment: Needs assistance;History of Falls Sitting-balance support: Feet supported Sitting balance-Leahy Scale: Poor Sitting balance - Comments: minA due ot fear of falling progressing to occasional min guard   Standing balance support: Bilateral upper extremity supported Standing balance-Leahy Scale: Poor Standing balance comment: reliant on +2 assist                             Pertinent Vitals/Pain Pain Assessment: Faces Faces Pain Scale: Hurts little more Pain Location: neck, generalized Pain Intervention(s): Monitored during session    Home Living Family/patient expects to be discharged to:: Skilled nursing facility  Additional Comments: Chart states pt is from "[facility]." Pt unsure if she will be able to return to home and is concerned of homelessness. Home set up not obtained at this time.    Prior Function Prior Level of  Function : Needs assist       Physical Assist : Mobility (physical);ADLs (physical) Mobility (physical): Bed mobility;Transfers;Gait;Stairs ADLs (physical): Feeding;Grooming;Bathing;Dressing;Toileting;IADLs Mobility Comments: pt states she has not been able to get out of bed for "weeks," uses rollator when able. Multiple falls. ADLs Comments: has been at bed level for "weeks." Prior to progressed weakness, pt was able to complete ADLs Mod I, pt poor historian with poor memory     Hand Dominance   Dominant Hand: Right    Extremity/Trunk Assessment   Upper Extremity Assessment RUE Deficits / Details: shoulder ROM to 90, elbow, wrist and hand ROM are WFL. generally weak with poor grip strength. Poor sensory motor control noted throughout with some tremorus movement observed RUE Sensation: decreased light touch;decreased proprioception RUE Coordination: decreased fine motor;decreased gross motor LUE Deficits / Details: shoulder ROM to 90, elbow, wrist and hand ROM are WFL. generally weak with poor grip strength. Left weaker than R. Poor sensory motor control noted throughout with some tremorus movement observed LUE Sensation: decreased light touch;decreased proprioception LUE Coordination: decreased fine motor;decreased gross motor    Lower Extremity Assessment Lower Extremity Assessment: Generalized weakness (pt with jerky/shaking movement, most likely due to anxiety)    Cervical / Trunk Assessment Cervical / Trunk Assessment: Neck Surgery  Communication   Communication: No difficulties  Cognition Arousal/Alertness: Awake/alert Behavior During Therapy: Anxious;Impulsive Overall Cognitive Status: History of cognitive impairments - at baseline                                 General Comments: pt very anxious t/o session re: fear of falling, pt with noted poor memory, oriented to self and place/situation, pt didn't recognize surgeon        General Comments General  comments (skin integrity, edema, etc.): VSS, assisted to [facility]    Exercises     Assessment/Plan    PT Assessment Patient needs continued PT services  PT Problem List Decreased strength;Decreased activity tolerance;Decreased balance;Decreased mobility       PT Treatment Interventions DME instruction;Gait training;Stair training;Functional mobility training;Therapeutic activities;Therapeutic exercise;Balance training;Neuromuscular re-education;Cognitive remediation;Patient/family education    PT Goals (Current goals can be found in the Care Plan section)  Acute Rehab PT Goals Patient Stated Goal: get better PT Goal Formulation: With patient Time For Goal Achievement: 07/01/21 Potential to Achieve Goals: Good    Frequency Min 5X/week   Barriers to discharge Inaccessible home environment;Decreased caregiver support unsure of actual living situation and support pt has available, pt poor historian and conflicting info in chart    Co-evaluation PT/OT/SLP Co-Evaluation/Treatment: Yes Reason for Co-Treatment: Complexity of the patient's impairments (multi-system involvement) PT goals addressed during session: Mobility/safety with mobility         AM-PAC PT "6 Clicks" Mobility  Outcome Measure Help needed turning from your back to your side while in a flat bed without using bedrails?: A Little Help needed moving from lying on your back to sitting on the side of a flat bed without using bedrails?: A Little Help needed moving to and from a bed to a chair (including a wheelchair)?: A Lot Help needed standing up from a chair using your arms (e.g., wheelchair or  bedside chair)?: A Lot Help needed to walk in hospital room?: A Lot Help needed climbing 3-5 steps with a railing? : Total 6 Click Score: 13    End of Session Equipment Utilized During Treatment: Gait belt Activity Tolerance:  (limited by anxiety) Patient left: in chair;with call bell/phone within reach;with bed alarm  set Nurse Communication: Mobility status PT Visit Diagnosis: Unsteadiness on feet (R26.81);Muscle weakness (generalized) (M62.81);Difficulty in walking, not elsewhere classified (R26.2)    Time: 5525-8948 PT Time Calculation (min) (ACUTE ONLY): 19 min   Charges:   PT Evaluation $PT Eval Moderate Complexity: 1 Mod          Kittie Plater, PT, DPT Acute Rehabilitation Services Pager #: [phone] Office #: [phone]   Berline Lopes 06/17/2021, 2:26 PM

## 2021-06-17 NOTE — Evaluation (Signed)
Occupational Therapy Evaluation Patient Details Name: [name] MRN: [id number] DOB: April 10, 1946 Today's Date: 06/17/2021   History of Present Illness 75 y.o. female admitted 12/1 for requent falls and bilat hand numbness, she is now  C3-4 ACDF 12/3. PMH: medical history significant for depression, anxiety, cognitive impairment, hypertension, rheumatoid arthritis, and diabetes mellitus   Clinical Impression   [name] reports being at bed level for the past few weeks with limited mobility and having assistance at bed level for ADLs, she also endorses several falls. She came from a group home but is unsure if she will be able to return, home set up not obtained. Pt was extremely anxious throughout the entire session with fear of falls, and stating "I can't do it" while completing the task asked of her. She benefits from gentle encouragement and simple step-by-step cues to limit impulsivity. She has poor sensory motor control and was tremorous  throughout session. Overall she required mod A for bed mobility, max a +2 for simple transfers and up to max A +2 for ADLs. Pt states feeding herself is difficult and may benefit from AE education acutely. Recommend d/c to SNF to continue rehab.    Recommendations for follow up therapy are one component of a multi-disciplinary discharge planning process, led by the attending physician.  Recommendations may be updated based on patient status, additional functional criteria and insurance authorization.   Follow Up Recommendations  Skilled nursing-short term rehab (<3 hours/day)    Assistance Recommended at Discharge Frequent or constant Supervision/Assistance  Functional Status Assessment  Patient has had a recent decline in their functional status and/or demonstrates limited ability to make significant improvements in function in a reasonable and predictable amount of time  Equipment Recommendations  BSC/3in1 (RW)    Recommendations for Other  Services       Precautions / Restrictions Precautions Precautions: Fall;Cervical Precaution Booklet Issued: No Precaution Comments: verbal cues during functional tasks Restrictions Weight Bearing Restrictions: No      Mobility Bed Mobility Overal bed mobility: Needs Assistance Bed Mobility: Rolling;Sidelying to Sit Rolling: Mod assist Sidelying to sit: Mod assist       General bed mobility comments: verbal cues for log roll and sequencing, mod A for physical assist and safety    Transfers Overall transfer level: Needs assistance Equipment used: 2 person hand held assist Transfers: Sit to/from Stand;Bed to chair/wheelchair/BSC Sit to Stand: Max assist;+2 physical assistance;+2 safety/equipment Stand pivot transfers: Max assist;+2 physical assistance;+2 safety/equipment         General transfer comment: max A +2 to power into standing with bilat knees blocked. pt with extreme fear of falling and benefits from simple one step verbal cues to sequence through task. Max A +2 for SP from bed>BSC>chair      Balance Overall balance assessment: Needs assistance;History of Falls Sitting-balance support: Feet supported Sitting balance-Leahy Scale: Poor Sitting balance - Comments: intermittent close min guard   Standing balance support: Bilateral upper extremity supported Standing balance-Leahy Scale: Poor Standing balance comment: reliant on +2 assist                           ADL either performed or assessed with clinical judgement   ADL Overall ADL's : Needs assistance/impaired Eating/Feeding: Moderate assistance;Bed level   Grooming: Moderate assistance;Bed level   Upper Body Bathing: Maximal assistance;Sitting   Lower Body Bathing: Maximal assistance;+2 for physical assistance;+2 for safety/equipment;Sit to/from stand   Upper Body Dressing : Moderate  assistance;Sitting   Lower Body Dressing: Maximal assistance;+2 for physical assistance;+2 for  safety/equipment;Sit to/from stand   Toilet Transfer: Maximal assistance;+2 for physical assistance;+2 for safety/equipment;Stand-pivot;BSC/3in1   Toileting- Clothing Manipulation and Hygiene: Sit to/from stand;+2 for physical assistance;+2 for safety/equipment;Maximal assistance Toileting - Clothing Manipulation Details (indicate cue type and reason): pt able to wipe frontal peri area in sitting, required max +2 fro rear peri and throughness     Functional mobility during ADLs: Maximal assistance;+2 for physical assistance;+2 for safety/equipment General ADL Comments: pt limited by BUE weakness and parasthesias, poor sensory motor control, cognition, and anxiety.     Vision Baseline Vision/History: 0 No visual deficits Ability to See in Adequate Light: 0 Adequate Vision Assessment?: No apparent visual deficits     Perception     Praxis      Pertinent Vitals/Pain Pain Assessment: Faces Faces Pain Scale: Hurts a little bit Pain Location: neck, generalized Pain Intervention(s): Limited activity within patient's tolerance;Monitored during session     Hand Dominance Right   Extremity/Trunk Assessment Upper Extremity Assessment Upper Extremity Assessment: RUE deficits/detail;LUE deficits/detail RUE Deficits / Details: shoulder ROM to 90, elbow, wrist and hand ROM are WFL. generally weak with poor grip strength. Poor sensory motor control noted throughout with some tremorus movement observed RUE Sensation: decreased light touch;decreased proprioception RUE Coordination: decreased fine motor;decreased gross motor LUE Deficits / Details: shoulder ROM to 90, elbow, wrist and hand ROM are WFL. generally weak with poor grip strength. Left weaker than R. Poor sensory motor control noted throughout with some tremorus movement observed LUE Sensation: decreased light touch;decreased proprioception LUE Coordination: decreased fine motor;decreased gross motor   Lower Extremity  Assessment Lower Extremity Assessment: Defer to PT evaluation   Cervical / Trunk Assessment Cervical / Trunk Assessment: Neck Surgery   Communication Communication Communication: No difficulties   Cognition Arousal/Alertness: Awake/alert Behavior During Therapy: Anxious;Impulsive Overall Cognitive Status: History of cognitive impairments - at baseline                                 General Comments: pt extremely anxious throughout session with fear of falling. Pt benefits from simple one step verbal cues to sequence through all functional tasks. Pt often stating "I can't do it" while doing the task asked of her. Per pt history, she has baseline cognitive impairment, no family present to obtain baseline     General Comments  VSS on RA, no family present    Exercises     Shoulder Instructions      Home Living Family/patient expects to be discharged to:: Skilled nursing facility                                 Additional Comments: Chart states pt is from "[facility]." Pt unsure if she will be able to return to home and is concerned of homelessness. Home set up not obtained at this time.      Prior Functioning/Environment Prior Level of Function : Needs assist       Physical Assist : Mobility (physical);ADLs (physical) Mobility (physical): Bed mobility;Transfers;Gait;Stairs ADLs (physical): Feeding;Grooming;Bathing;Dressing;Toileting;IADLs Mobility Comments: pt states she has not been able to get out of bed for "weeks," uses rollator when able. Multiple falls. ADLs Comments: has been at bed level for "weeks." Prior to progressed weakness, pt was able to complete ADLs Mod I  OT Problem List: Decreased strength;Decreased range of motion;Decreased activity tolerance;Impaired balance (sitting and/or standing);Decreased cognition;Decreased knowledge of use of DME or AE;Decreased safety awareness;Decreased knowledge of  precautions;Pain;Impaired UE functional use      OT Treatment/Interventions: Self-care/ADL training;Therapeutic exercise;Balance training;Patient/family education;Therapeutic activities;DME and/or AE instruction    OT Goals(Current goals can be found in the care plan section) Acute Rehab OT Goals Patient Stated Goal: not to fall OT Goal Formulation: With patient Time For Goal Achievement: 07/01/21 Potential to Achieve Goals: Good ADL Goals Pt Will Perform Eating: with set-up;sitting Pt Will Perform Grooming: with set-up;sitting Pt Will Perform Upper Body Dressing: with set-up;sitting Pt Will Perform Lower Body Dressing: with min assist;sit to/from stand Pt Will Transfer to Toilet: with min assist;ambulating  OT Frequency: Min 2X/week   Barriers to D/C: Decreased caregiver support          Co-evaluation PT/OT/SLP Co-Evaluation/Treatment: Yes Reason for Co-Treatment: Complexity of the patient's impairments (multi-system involvement);Necessary to address cognition/behavior during functional activity;For patient/therapist safety;To address functional/ADL transfers   OT goals addressed during session: ADL's and self-care;Proper use of Adaptive equipment and DME      AM-PAC OT "6 Clicks" Daily Activity     Outcome Measure Help from another person eating meals?: A Lot Help from another person taking care of personal grooming?: A Lot Help from another person toileting, which includes using toliet, bedpan, or urinal?: A Lot Help from another person bathing (including washing, rinsing, drying)?: A Lot Help from another person to put on and taking off regular upper body clothing?: A Lot Help from another person to put on and taking off regular lower body clothing?: A Lot 6 Click Score: 12   End of Session Equipment Utilized During Treatment: Gait belt (BSC) Nurse Communication: Mobility status  Activity Tolerance: Patient tolerated treatment well Patient left: in chair;with call  bell/phone within reach;with chair alarm set  OT Visit Diagnosis: Unsteadiness on feet (R26.81);Other abnormalities of gait and mobility (R26.89);Repeated falls (R29.6);Muscle weakness (generalized) (M62.81);History of falling (Z91.81);Pain;Other symptoms and signs involving cognitive function                Time: 3338-3291 OT Time Calculation (min): 23 min Charges:  OT General Charges $OT Visit: 1 Visit OT Evaluation $OT Eval Moderate Complexity: 1 Mod   Marisue Canion A [facility] 06/17/2021, 10:01 AM

## 2021-06-17 NOTE — Progress Notes (Signed)
PROGRESS NOTE    [name]  GYI:[id number] DOB: 01-23-1946 DOA: 06/13/2021 PCP: Aretta Nip, MD  Brief Narrative:  [name] is a pleasant 75 yo female previously living at a group home who closed and moved in with friends recently with medical history significant for depression, anxiety, cognitive impairment, hypertension, rheumatoid arthritis, and diabetes mellitus, now presenting to the emergency department for evaluation of extremity numbness, difficulty ambulating, and recurrent falls with several days of difficulty ambulating and has complained of numbness and tingling in her bilateral hands and lower extremities.  Neurosurgery consulted at intake.  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; lumbar spinal stenosis, POA - Presents with worsening bilateral upper and lower extremity weakness with recurrent falls - Status post discectomy and C3-4 decompression of spinal cord on 06/15/2021 with Medtronic 6 mm cage and bone allograft - Tolerated procedure quite well, pain currently well controlled - Range of motion and strength markedly improving daily but not yet back to baseline - continues to have limited ROM and moderate paresthesias in her hands making ADLs difficult  Depression, anxiety, cognitive impairment  - Continue home medications including Lexapro, Zyprexa - Appears to be quite anxious at baseline - might benefit from further eval with outpt PCP/psych once stable for discharge   CKD IIIa, at baseline - Continue to follow   Rheumatoid arthritis  - Pharmacy medication-reconciliation pending     Hypertension  - Pharmacy medication reconciliation pending - Treat as needed for now    Hx of type II DM  - A1c 5.4% - diet-controlled - Monitor, use low-intensity SSI if needed    DVT prophylaxis: SCDs  Code Status: Full  Family Communication: Sister updated over the phone  Status is: Inpatient  Dispo: The  patient is from: Home              Anticipated d/c is to: To be determined              Anticipated d/c date is: 48 to 72 hours pending clinical course and postsurgical recovery              Patient currently not medically stable for discharge  Consultants:  Neurosurgery  Procedures:  06/15/21: Discectomy at C3-4 for decompression of spinal cord and exiting nerve roots; Placement of intervertebral biomechanical device Medtronic zero-profile 60mm cage, 46mm screws; use of morselized bone allograft   Antimicrobials:  Perioperatively  Subjective: No acute issues or events overnight, pain currently well controlled denies nausea vomiting diarrhea constipation headache fevers chills or chest pain  Objective: Vitals:   06/16/21 1658 06/16/21 2037 06/17/21 0058 06/17/21 0400  BP: 136/76 (!) 174/77 (!) 148/72 140/68  Pulse: (!) 105 (!) 109 92 83  Resp: 17 20 18 16   Temp: 99.2 F (37.3 C) 98.4 F (36.9 C) 97.6 F (36.4 C) 97.7 F (36.5 C)  TempSrc: Oral Axillary Axillary Axillary  SpO2: 95% 96% 96% 96%  Weight:      Height:        Intake/Output Summary (Last 24 hours) at 06/17/2021 0741 Last data filed at 06/16/2021 2138 Gross per 24 hour  Intake 220 ml  Output --  Net 220 ml    Filed Weights   06/13/21 1313  Weight: 77.1 kg    Examination:  General:  Pleasantly resting in bed, No acute distress. HEENT:  Normocephalic atraumatic.  Sclerae nonicteric, noninjected.  Extraocular movements intact bilaterally. Neck: Anterior neck bandage clean dry intact Lungs:  Clear to auscultate bilaterally without rhonchi, wheeze, or rales. Heart:  Regular rate and rhythm.  Without murmurs, rubs, or gallops. Abdomen:  Soft, nontender, nondistended.  Without guarding or rebound. Extremities: Right upper extremity 4 out of 5 strength left upper extremity 3 out of 3, diffuse paresthesias bilateral upper extremities ongoing Vascular:  Dorsalis pedis and posterior tibial pulses palpable  bilaterally. Skin:  Warm and dry, no erythema, no ulcerations.  Data Reviewed: I have personally reviewed following labs and imaging studies  CBC: Recent Labs  Lab 06/13/21 1304 06/14/21 0445 06/15/21 0043 06/16/21 0152 06/17/21 0453  WBC 10.2 7.7 7.4 8.1 8.6  NEUTROABS 8.9*  --   --   --   --   HGB 11.9* 11.5* 10.5* 11.7* 11.1*  HCT 38.0 36.4 34.0* 35.9* 34.8*  MCV 97.7 98.6 97.1 94.5 94.6  PLT 166 141* 137* 147* 146*    Basic Metabolic Panel: Recent Labs  Lab 06/13/21 1304 06/14/21 0445 06/15/21 0043 06/16/21 0152 06/17/21 0453  NA 139 138 137 138 139  K 4.4 3.6 3.9 4.2 3.7  CL 106 107 105 107 105  CO2 24 24 23 23 25   GLUCOSE 109* 84 92 144* 87  BUN 22 18 16 18 20   CREATININE 1.13* 0.99 1.07* 0.99 0.97  CALCIUM 8.3* 7.9* 7.7* 8.2* 8.3*    GFR: Estimated Creatinine Clearance: 49.3 mL/min (by C-G formula based on SCr of 0.97 mg/dL). Liver Function Tests: Recent Labs  Lab 06/13/21 1304  AST 29  ALT 19  ALKPHOS 100  BILITOT 0.6  PROT 7.0  ALBUMIN 3.8    No results for input(s): LIPASE, AMYLASE in the last 168 hours. Recent Labs  Lab 06/13/21 1420  AMMONIA 18    Coagulation Profile: No results for input(s): INR, PROTIME in the last 168 hours. Cardiac Enzymes: No results for input(s): CKTOTAL, CKMB, CKMBINDEX, TROPONINI in the last 168 hours. BNP (last 3 results) No results for input(s): PROBNP in the last 8760 hours. HbA1C: Recent Labs    06/14/21 2112  HGBA1C 5.4    CBG: Recent Labs  Lab 06/16/21 0615 06/16/21 1203 06/16/21 1712 06/16/21 2113 06/17/21 0648  GLUCAP 135* 104* 98 106* 83    Lipid Profile: No results for input(s): CHOL, HDL, LDLCALC, TRIG, CHOLHDL, LDLDIRECT in the last 72 hours. Thyroid Function Tests: No results for input(s): TSH, T4TOTAL, FREET4, T3FREE, THYROIDAB in the last 72 hours.  Anemia Panel: No results for input(s): VITAMINB12, FOLATE, FERRITIN, TIBC, IRON, RETICCTPCT in the last 72 hours. Sepsis  Labs: No results for input(s): PROCALCITON, LATICACIDVEN in the last 168 hours.  Recent Results (from the past 240 hour(s))  Resp Panel by RT-PCR (Flu A&B, Covid) Nasopharyngeal Swab     Status: None   Collection Time: 06/13/21  2:27 PM   Specimen: Nasopharyngeal Swab; Nasopharyngeal(NP) swabs in vial transport medium  Result Value Ref Range Status   SARS Coronavirus 2 by RT PCR NEGATIVE NEGATIVE Final    Comment: (NOTE) SARS-CoV-2 target nucleic acids are NOT DETECTED.  The SARS-CoV-2 RNA is generally detectable in upper respiratory specimens during the acute phase of infection. The lowest concentration of SARS-CoV-2 viral copies this assay can detect is 138 copies/mL. A negative result does not preclude SARS-Cov-2 infection and should not be used as the sole basis for treatment or other patient management decisions. A negative result may occur with  improper specimen collection/handling, submission of specimen other than nasopharyngeal swab, presence of viral mutation(s) within the areas targeted by this assay,  and inadequate number of viral copies(<138 copies/mL). A negative result must be combined with clinical observations, patient history, and epidemiological information. The expected result is Negative.  Fact Sheet for Patients:  EntrepreneurPulse.com.au  Fact Sheet for Healthcare Providers:  IncredibleEmployment.be  This test is no t yet approved or cleared by the [place] FDA and  has been authorized for detection and/or diagnosis of SARS-CoV-2 by FDA under an Emergency Use Authorization (EUA). This EUA will remain  in effect (meaning this test can be used) for the duration of the COVID-19 declaration under Section 564(b)(1) of the Act, 21 U.S.C.section 360bbb-3(b)(1), unless the authorization is terminated  or revoked sooner.       Influenza A by PCR NEGATIVE NEGATIVE Final   Influenza B by PCR NEGATIVE NEGATIVE Final     Comment: (NOTE) The Xpert Xpress SARS-CoV-2/FLU/RSV plus assay is intended as an aid in the diagnosis of influenza from Nasopharyngeal swab specimens and should not be used as a sole basis for treatment. Nasal washings and aspirates are unacceptable for Xpert Xpress SARS-CoV-2/FLU/RSV testing.  Fact Sheet for Patients: EntrepreneurPulse.com.au  Fact Sheet for Healthcare Providers: IncredibleEmployment.be  This test is not yet approved or cleared by the [place] FDA and has been authorized for detection and/or diagnosis of SARS-CoV-2 by FDA under an Emergency Use Authorization (EUA). This EUA will remain in effect (meaning this test can be used) for the duration of the COVID-19 declaration under Section 564(b)(1) of the Act, 21 U.S.C. section 360bbb-3(b)(1), unless the authorization is terminated or revoked.  Performed at [facility], [place] [address] [place], [place] [address]   Surgical pcr screen     Status: Abnormal   Collection Time: 06/15/21 11:13 AM   Specimen: Nasal Mucosa; Nasal Swab  Result Value Ref Range Status   MRSA, PCR NEGATIVE NEGATIVE Final   Staphylococcus aureus POSITIVE (A) NEGATIVE Final    Comment: (NOTE) The Xpert SA Assay (FDA approved for NASAL specimens in patients 83 years of age and older), is one component of a comprehensive surveillance program. It is not intended to diagnose infection nor to guide or monitor treatment. Performed at [facility], [place] [address] [place],  [address]           Radiology Studies: DG Cervical Spine 1 View  Result Date: 06/15/2021 CLINICAL DATA:  ACDF EXAM: DG CERVICAL SPINE - 1 VIEW COMPARISON:  None. FINDINGS: Post ACDF at C3-C4. Partially imaged prior ACDF extending inferiorly from C4. IMPRESSION: C3-C4 ACDF. Electronically Signed   By: Macy Mis M.D.   On: 06/15/2021 14:46   DG C-Arm 1-60 Min-No Report  Result Date:  06/15/2021 Fluoroscopy was utilized by the requesting physician.  No radiographic interpretation.   DG C-Arm 1-60 Min-No Report  Result Date: 06/15/2021 Fluoroscopy was utilized by the requesting physician.  No radiographic interpretation.    Scheduled Meds:  benztropine  0.5 mg Oral BID   docusate sodium  100 mg Oral BID   escitalopram  10 mg Oral Daily   hydroxychloroquine  200 mg Oral BID   insulin aspart  0-9 Units Subcutaneous TID WC & HS   leflunomide  20 mg Oral Daily   metoprolol succinate  25 mg Oral Daily   OLANZapine  2.5 mg Oral QHS   polyethylene glycol  17 g Oral Daily   predniSONE  5 mg Oral Q breakfast    LOS: 3 days   Time spent: 12min  Fenton Candee C Garrie Elenes, DO [facility]  If 7PM-7AM, please contact night-coverage [email or web address]  06/17/2021, 7:41 AM

## 2021-06-18 ENCOUNTER — Encounter (HOSPITAL_COMMUNITY): Payer: Self-pay | Admitting: Neurosurgery

## 2021-06-18 LAB — GLUCOSE, CAPILLARY
Glucose-Capillary: 97 mg/dL (ref 70–99)
Glucose-Capillary: 108 mg/dL — ABNORMAL HIGH (ref 70–99)
Glucose-Capillary: 121 mg/dL — ABNORMAL HIGH (ref 70–99)
Glucose-Capillary: 105 mg/dL — ABNORMAL HIGH (ref 70–99)

## 2021-06-18 MED ORDER — HYDROCODONE-ACETAMINOPHEN 5-325 MG PO TABS: 1.0000 | ORAL_TABLET | ORAL | 0 refills | Status: DC | PRN

## 2021-06-18 NOTE — TOC Progression Note (Signed)
Transition of Care [facility]) - Progression Note    Patient Details  Name: AVALYN MOLINO MRN: [id number] Date of Birth: [birth year]  Transition of Care [facility]) CM/SW [place], [place] Phone Number: 06/18/2021, 4:20 PM  Clinical Narrative:   CSW spoke with patient's sister, [name], earlier today to discuss SNF offers. [name] reviewed offer at [facility], and indicated that she would be ok with that, but it ultimately needs to be the patient's decision. CSW met with patient to discuss with her, and she said she didn't want to go to SNF and would rather go home. CSW discussed with the patient where else she would go and who would assist, as she was a +2 with a stedy for PT session today. Patient said she couldn't answer that, either. CSW discussed reasoning for SNF and how it would be short term to get her feeling better, and patient still declining but also not offering any other disposition options. CSW asked patient to think about SNF overnight and CSW will touch base with patient in the morning, as patient is ready for discharge at this time. CSW to follow.    Expected Discharge Plan: Skilled Nursing Facility Barriers to Discharge: Continued Medical Work up, Ship broker, SNF Pending bed offer, Family Issues, Unsafe home situation  Expected Discharge Plan and Services Expected Discharge Plan: [place] In-house Referral: Clinical Social Work   Post Acute Care Choice: [place] Living arrangements for the past 2 months: Group Home                                       Social Determinants of Health (SDOH) Interventions    Readmission Risk Interventions No flowsheet data found.

## 2021-06-18 NOTE — Progress Notes (Addendum)
Physical Therapy Treatment Patient Details Name: [name] MRN: [id number] DOB: [birth year] Today's Date: 06/18/2021   History of Present Illness 75 y.o. female admitted 12/1 for requent falls and bilat hand numbness, she is now  C3-4 ACDF 12/3. PMH: medical history significant for depression, anxiety, cognitive impairment, hypertension, rheumatoid arthritis, and diabetes mellitus    PT Comments    Pt received in supine and agreeable to therapy session with encouragement. Pt responds well to gentle encouragement throughout session as she is very anxious and has a fear of falling and does not seem to trust herself. Pt reported 8/10 in generalized neck area and requesting pain medications, RN notified. Pt performed STS x3 (x1 with +2 HHA, x2 to [facility]) with up to maxA+2 due to bilateral knees buckling and pt fear of falling causing posterior lean. Pt demonstrated rolling to L and R to place bed pan underneath at end of session, RN/NT notified. Pt very restless this session, constantly kicking legs throughout and encouraged to not move too much while on bed pan. Continue to recommend SNF level therapies upon DC. Pt continues to benefit from PT services to progress toward functional mobility goals.      Recommendations for follow up therapy are one component of a multi-disciplinary discharge planning process, led by the attending physician.  Recommendations may be updated based on patient status, additional functional criteria and insurance authorization.  Follow Up Recommendations  Skilled nursing-short term rehab (<3 hours/day)     Assistance Recommended at Discharge Frequent or constant Supervision/Assistance  Equipment Recommendations   (TBD at next venue)    Recommendations for Other Services       Precautions / Restrictions Precautions Precautions: Fall;Cervical Precaution Booklet Issued: No Precaution Comments: verbal cues during functional tasks, pt with poor memory and unable  to comperhend or adhere to neck precautions Restrictions Weight Bearing Restrictions: No     Mobility  Bed Mobility Overal bed mobility: Needs Assistance Bed Mobility: Rolling;Sidelying to Sit;Sit to Sidelying Rolling: Mod assist Sidelying to sit: Mod assist;+2 for physical assistance    Sit to sidelying: Mod assist;+2 for physical assistance General bed mobility comments: max direcitonal verbal cues and encouragement, modA+2 with sitting for LE management and trunk elevation    Transfers Overall transfer level: Needs assistance Equipment used: 2 person hand held assist;Ambulation equipment used Transfers: Sit to/from Stand;Bed to chair/wheelchair/BSC Sit to Stand: Max assist;+2 physical assistance;+2 safety/equipment Stand pivot transfers: Max assist;+2 physical assistance;From elevated surface   General transfer comment: max A +2 to power into standing with bilat knees blocked. pt with extreme fear of falling and benefits from simple one step verbal cues to sequence through task. [name] for x2 STS trials due to knee buckling and fear of falling Transfer via Lift Equipment: Stedy  Ambulation/Gait  General Gait Details: unable this date        Balance Overall balance assessment: Needs assistance;History of Falls Sitting-balance support: Feet supported Sitting balance-Leahy Scale: Poor Sitting balance - Comments: minA due to fear of falling   Standing balance support: Bilateral upper extremity supported Standing balance-Leahy Scale: Poor Standing balance comment: reliant on +2 assist      Cognition Arousal/Alertness: Awake/alert Behavior During Therapy: Anxious;Impulsive;Restless Overall Cognitive Status: History of cognitive impairments - at baseline   General Comments: pt very anxious and restless session due to fear of falling       Exercises Other Exercises Other Exercises: Supine: ankle pumps, heel slides, hip ab/adduction, glute sets, quad sets, SAQ  x10  ea Other Exercises: Seated: LAQ x10 Other Exercises: STS x3, and static standing    General Comments General comments (skin integrity, edema, etc.): VSS on RA      Pertinent Vitals/Pain Pain Assessment: Faces Faces Pain Scale: Hurts little more Pain Location: neck, generalized Pain Intervention(s): Monitored during session;Limited activity within patient's tolerance    PT Goals (current goals can now be found in the care plan section) Acute Rehab PT Goals Patient Stated Goal: get better PT Goal Formulation: With patient Time For Goal Achievement: 07/01/21 Progress towards PT goals: Progressing toward goals    Frequency    Min 5X/week      PT Plan Current plan remains appropriate       AM-PAC PT "6 Clicks" Mobility   Outcome Measure  Help needed turning from your back to your side while in a flat bed without using bedrails?: A Lot (mod+ cues needed for this and all below) Help needed moving from lying on your back to sitting on the side of a flat bed without using bedrails?: A Lot Help needed moving to and from a bed to a chair (including a wheelchair)?: A Lot Help needed standing up from a chair using your arms (e.g., wheelchair or bedside chair)?: A Lot Help needed to walk in hospital room?: A Lot Help needed climbing 3-5 steps with a railing? : Total 6 Click Score: 11    End of Session Equipment Utilized During Treatment: Gait belt Activity Tolerance:  (limited by anxiety) Patient left: with call bell/phone within reach;with bed alarm set;in bed;Other (comment) (on bed pan, RN/NT notified) Nurse Communication: Mobility status PT Visit Diagnosis: Unsteadiness on feet (R26.81);Muscle weakness (generalized) (M62.81);Difficulty in walking, not elsewhere classified (R26.2)     Time: 6606-3016 PT Time Calculation (min) (ACUTE ONLY): 22 min  Charges:  $Therapeutic Exercise: 8-22 mins                     [name], Student PTA CI: Julia P.,  PTA  [name] 06/18/2021, 2:42 PM

## 2021-06-18 NOTE — Anesthesia Postprocedure Evaluation (Addendum)
Anesthesia Post Note  Patient: JANEAN EISCHEN  Procedure(s) Performed: ANTERIOR CERVICAL DECOMPRESSION/DISCECTOMY FUSION CERVICAL THREE-FOUR (Neck)     Patient location during evaluation: PACU Anesthesia Type: General Level of consciousness: awake and alert Pain management: pain level controlled Vital Signs Assessment: post-procedure vital signs reviewed and stable Respiratory status: spontaneous breathing, nonlabored ventilation, respiratory function stable and patient connected to nasal cannula oxygen Cardiovascular status: blood pressure returned to baseline and stable Postop Assessment: no apparent nausea or vomiting Anesthetic complications: no   No notable events documented.  Last Vitals:  Vitals:   06/18/21 0506 06/18/21 0819  BP: (!) 165/77 (!) 146/61  Pulse: 98 96  Resp: 16 17  Temp: 36.4 C 36.9 C  SpO2: 98% 98%    Last Pain:  Vitals:   06/18/21 0819  TempSrc: Oral  PainSc:                  Jillian Pianka

## 2021-06-18 NOTE — Discharge Summary (Addendum)
Physician Discharge Summary  [name] ZOX:[id number] DOB: [birth year] DOA: 06/13/2021  PCP: Aretta Nip, MD  Admit date: 06/13/2021 Discharge date: 06/19/2021  Admitted From: Home Disposition: SNF  Patient seen and examined this morning, stable for discharge. Please refer to DC summary below as per Dr Cherlynn Kaiser. Julia Lederer, MD, PhD [facility]  Between 7 am - 7 pm you can contact me via Amion (for emergencies) or [place] (non urgent matters).  I am not available 7 pm - 7 am, please contact night coverage MD/APP via Amion   Recommendations for Outpatient Follow-up:  Follow up with PCP in 1-2 weeks Please obtain BMP/CBC in one week Please follow up with neurosurgery as scheduled  Discharge Condition: Stable CODE STATUS: Full low-salt low-fat diet as tolerated Diet recommendation:    Brief/Interim Summary: [name] is a pleasant 75 yo female previously living at a group home who closed and moved in with friends recently with medical history significant for depression, anxiety, cognitive impairment, hypertension, rheumatoid arthritis, and diabetes mellitus, now presenting to the emergency department for evaluation of extremity numbness, difficulty ambulating, and recurrent falls with several days of difficulty ambulating and has complained of numbness and tingling in her bilateral hands and lower extremities. Neurosurgery consulted at intake.  Patient tolerated procedure quite well as below, continues to have postoperative weakness ambulatory dysfunction paresthesias that are improving but not yet back to baseline, as such patient has been recommended for skilled nursing facility placement for ongoing physical therapy which family, her sister, is agreeable to.   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; lumbar spinal stenosis, POA - Presents with worsening bilateral upper and lower extremity weakness with  recurrent falls - Status post discectomy and C3-4 decompression of spinal cord on 06/15/2021 with [employer] 6 mm cage and bone allograft - Tolerated procedure quite well, pain currently well controlled - Range of motion and strength markedly improving daily but not yet back to baseline - continues to have limited ROM and moderate paresthesias in her hands making ADLs difficult -Pending acceptance to SNF for ongoing physical therapy needs and assistance with ADLs   Depression, anxiety, cognitive impairment  - Continue home medications including Lexapro, Zyprexa - Appears to be quite anxious at baseline - might benefit from further eval with outpt PCP/psych once stable for discharge   CKD IIIa, at baseline - Continue to follow   Rheumatoid arthritis  - Pharmacy medication-reconciliation pending     Hypertension  - Pharmacy medication reconciliation pending - Treat as needed for now    Hx of type II DM  - A1c 5.4% - diet-controlled - Monitor, use low-intensity SSI if needed   Discharge Instructions   Allergies as of 06/19/2021       Reactions   Contrast Media [iodinated Diagnostic Agents] Anaphylaxis, Hives, Other (See Comments)   Only the "retina dye"   Abilify [aripiprazole] Other (See Comments)   Pt does not recall reaction   Ultram [tramadol Hcl] Other (See Comments)   Reaction unknown   Zithromax [azithromycin] Anxiety, Other (See Comments)   Heightens anxiety        Medication List     STOP taking these medications    docusate sodium 100 MG capsule Commonly known as: COLACE       TAKE these medications    Accu-Chek Guide test strip Generic drug: glucose blood CHECK BLOOD SUGAR ONCE A DAY   aspirin 81 MG chewable tablet  Chew 81 mg by mouth daily.   benztropine 0.5 MG tablet Commonly known as: COGENTIN Take 0.5 mg by mouth 2 (two) times daily.   escitalopram 10 MG tablet Commonly known as: LEXAPRO Take 10 mg by mouth daily.    HYDROcodone-acetaminophen 5-325 MG tablet Commonly known as: NORCO/VICODIN Take 1 tablet by mouth every 4 (four) hours as needed for moderate pain.   hydroxychloroquine 200 MG tablet Commonly known as: PLAQUENIL Take 400 mg by mouth 2 (two) times daily.   leflunomide 20 MG tablet Commonly known as: ARAVA Take 20 mg by mouth daily.   metoprolol succinate 25 MG 24 hr tablet Commonly known as: TOPROL-XL Take 25 mg by mouth daily.   OLANZapine 2.5 MG tablet Commonly known as: ZYPREXA Take 2.5 mg by mouth daily.   polyethylene glycol 17 g packet Commonly known as: MIRALAX / GLYCOLAX Take 17 g by mouth 2 (two) times daily.   predniSONE 5 MG tablet Commonly known as: DELTASONE Take 5 mg by mouth daily with breakfast.        Allergies  Allergen Reactions   Contrast Media [Iodinated Diagnostic Agents] Anaphylaxis, Hives and Other (See Comments)    Only the "retina dye"   Abilify [Aripiprazole] Other (See Comments)    Pt does not recall reaction   Ultram [Tramadol Hcl] Other (See Comments)    Reaction unknown   Zithromax [Azithromycin] Anxiety and Other (See Comments)    Heightens anxiety    Consultations: Neurosurgery   Procedures/Studies: DG Chest 2 View  Result Date: 06/13/2021 CLINICAL DATA:  Altered mental status. Recurrent falls. Generalized weakness. EXAM: CHEST - 2 VIEW COMPARISON:  12/28/2020 FINDINGS: Heart size is normal. Mild tortuosity of the aorta. The lungs are clear. No effusions. Lateral view shows previous spinal fusion with considerable adjacent segment degenerative changes at the upper margin. IMPRESSION: No active cardiopulmonary disease. Spinal degenerative changes at the upper margin of the spinal fusion. Electronically Signed   By: Nelson Chimes M.D.   On: 06/13/2021 13:29   DG Cervical Spine 1 View  Result Date: 06/15/2021 CLINICAL DATA:  ACDF EXAM: DG CERVICAL SPINE - 1 VIEW COMPARISON:  None. FINDINGS: Post ACDF at C3-C4. Partially imaged prior  ACDF extending inferiorly from C4. IMPRESSION: C3-C4 ACDF. Electronically Signed   By: Macy Mis M.D.   On: 06/15/2021 14:46   CT Head Wo Contrast  Result Date: 06/13/2021 CLINICAL DATA:  Altered mental status, multiple falls. EXAM: CT HEAD WITHOUT CONTRAST TECHNIQUE: Contiguous axial images were obtained from the base of the skull through the vertex without intravenous contrast. COMPARISON:  May 24, 2018. FINDINGS: Brain: Mild chronic ischemic white matter disease is noted. No mass effect or midline shift is noted. Ventricular size is within normal limits. There is no evidence of mass lesion, hemorrhage or acute infarction. Vascular: No hyperdense vessel or unexpected calcification. Skull: Normal. Negative for fracture or focal lesion. Sinuses/Orbits: No acute finding. Other: None. IMPRESSION: No acute intracranial abnormality seen. Electronically Signed   By: Marijo Conception M.D.   On: 06/13/2021 15:28   MR BRAIN WO CONTRAST  Result Date: 06/13/2021 CLINICAL DATA:  Acute neuro deficit. EXAM: MRI HEAD WITHOUT CONTRAST TECHNIQUE: Multiplanar, multiecho pulse sequences of the brain and surrounding structures were obtained without intravenous contrast. COMPARISON:  CT head 06/13/2021 FINDINGS: Brain: Motion degraded study.  Allowing for this, no acute infarct Moderate chronic microvascular ischemic change in the white matter. Negative for hydrocephalus or mass lesion. No hemorrhage identified Vascular: Normal arterial flow  voids Skull and upper cervical spine: Negative Sinuses/Orbits: Negative Other: None IMPRESSION: Image quality degraded by significant by motion Negative for acute infarct. Moderate chronic microvascular ischemic change in the white matter. Electronically Signed   By: Franchot Gallo M.D.   On: 06/13/2021 18:49   MR Cervical Spine Wo Contrast  Result Date: 06/14/2021 CLINICAL DATA:  Upper and lower extremity weakness EXAM: MRI CERVICAL, THORACIC AND LUMBAR SPINE WITHOUT CONTRAST  TECHNIQUE: Multiplanar and multiecho pulse sequences of the cervical spine, to include the craniocervical junction and cervicothoracic junction, and thoracic and lumbar spine, were obtained without intravenous contrast. COMPARISON:  None. FINDINGS: MRI CERVICAL SPINE FINDINGS Alignment: Physiologic. Vertebrae: C4-7 ACDF Cord: Mildly increased T2-weighted signal within the spinal cord at the C3 level Posterior Fossa, vertebral arteries, paraspinal tissues: Negative Disc levels: At C3-4, there is a large central disc extrusion with superior migration causing severe spinal canal stenosis and compressing the spinal cord. There is no other cervical spinal canal stenosis or advance neural foraminal stenosis. MRI THORACIC SPINE FINDINGS Alignment:  Physiologic. Vertebrae: No fracture, evidence of discitis, or bone lesion. Cord:  Normal signal and morphology. Paraspinal and other soft tissues: Negative. Disc levels: T8-9 small central disc protrusion without stenosis. The other thoracic levels are unremarkable. MRI LUMBAR SPINE FINDINGS Segmentation:  Standard. Alignment:  Physiologic. Vertebrae: L2-5 PLIF with posterior decompression. No acute abnormality. Chronic height loss of L1. Conus medullaris and cauda equina: Conus extends to the L1 level. Paraspinal and other soft tissues: Left kidney is absent. Disc levels: T12-L1: Small left subarticular disc protrusion without spinal canal or neural foraminal stenosis. L1-L2: Large right subarticular disc protrusion causing severe narrowing of the right lateral recess and severe central spinal canal stenosis. This has worsened compared to 11/02/2018. No neural foraminal stenosis. L2-L3: Posterior fusion and decompression. No spinal canal stenosis. No neural foraminal stenosis. L3-L4: Posterior fusion and decompression. No spinal canal stenosis. No neural foraminal stenosis. L4-L5: Posterior fusion decompression. No spinal canal stenosis. No neural foraminal stenosis. L5-S1:  Large central disc extrusion with inferior migration causing mass effect on the left S1 nerve root in the lateral recess. No central spinal canal stenosis. Severe bilateral neural foraminal stenosis. Visualized sacrum: Normal. IMPRESSION: 1. Large central disc extrusion at C3-4 causing severe spinal canal stenosis and compressing the spinal cord. Mildly increased T2-weighted signal within the spinal cord consistent with compressive myelopathy. 2. Large right subarticular disc protrusion at L1-L2 causing severe right lateral recess and severe central spinal canal stenosis. This has worsened compared to 04/21/202020. 3. Large central disc extrusion at L5-S1 with inferior migration causing mass effect on the left S1 nerve root in the lateral recess. Severe bilateral L5-S1 neural foraminal stenosis. Critical Value/emergent results were called by telephone at the time of interpretation on 06/14/2021 at 1:16 am to Dr. Florina Ou, Who verbally acknowledged these results. Electronically Signed   By: Ulyses Jarred M.D.   On: 06/14/2021 01:16   MR THORACIC SPINE WO CONTRAST  Result Date: 06/14/2021 CLINICAL DATA:  Upper and lower extremity weakness EXAM: MRI CERVICAL, THORACIC AND LUMBAR SPINE WITHOUT CONTRAST TECHNIQUE: Multiplanar and multiecho pulse sequences of the cervical spine, to include the craniocervical junction and cervicothoracic junction, and thoracic and lumbar spine, were obtained without intravenous contrast. COMPARISON:  None. FINDINGS: MRI CERVICAL SPINE FINDINGS Alignment: Physiologic. Vertebrae: C4-7 ACDF Cord: Mildly increased T2-weighted signal within the spinal cord at the C3 level Posterior Fossa, vertebral arteries, paraspinal tissues: Negative Disc levels: At C3-4, there is a large central disc  extrusion with superior migration causing severe spinal canal stenosis and compressing the spinal cord. There is no other cervical spinal canal stenosis or advance neural foraminal stenosis. MRI THORACIC SPINE  FINDINGS Alignment:  Physiologic. Vertebrae: No fracture, evidence of discitis, or bone lesion. Cord:  Normal signal and morphology. Paraspinal and other soft tissues: Negative. Disc levels: T8-9 small central disc protrusion without stenosis. The other thoracic levels are unremarkable. MRI LUMBAR SPINE FINDINGS Segmentation:  Standard. Alignment:  Physiologic. Vertebrae: L2-5 PLIF with posterior decompression. No acute abnormality. Chronic height loss of L1. Conus medullaris and cauda equina: Conus extends to the L1 level. Paraspinal and other soft tissues: Left kidney is absent. Disc levels: T12-L1: Small left subarticular disc protrusion without spinal canal or neural foraminal stenosis. L1-L2: Large right subarticular disc protrusion causing severe narrowing of the right lateral recess and severe central spinal canal stenosis. This has worsened compared to 11/02/2018. No neural foraminal stenosis. L2-L3: Posterior fusion and decompression. No spinal canal stenosis. No neural foraminal stenosis. L3-L4: Posterior fusion and decompression. No spinal canal stenosis. No neural foraminal stenosis. L4-L5: Posterior fusion decompression. No spinal canal stenosis. No neural foraminal stenosis. L5-S1: Large central disc extrusion with inferior migration causing mass effect on the left S1 nerve root in the lateral recess. No central spinal canal stenosis. Severe bilateral neural foraminal stenosis. Visualized sacrum: Normal. IMPRESSION: 1. Large central disc extrusion at C3-4 causing severe spinal canal stenosis and compressing the spinal cord. Mildly increased T2-weighted signal within the spinal cord consistent with compressive myelopathy. 2. Large right subarticular disc protrusion at L1-L2 causing severe right lateral recess and severe central spinal canal stenosis. This has worsened compared to 04/21/202020. 3. Large central disc extrusion at L5-S1 with inferior migration causing mass effect on the left S1 nerve root  in the lateral recess. Severe bilateral L5-S1 neural foraminal stenosis. Critical Value/emergent results were called by telephone at the time of interpretation on 06/14/2021 at 1:16 am to Dr. Florina Ou, Who verbally acknowledged these results. Electronically Signed   By: Ulyses Jarred M.D.   On: 06/14/2021 01:16   MR LUMBAR SPINE WO CONTRAST  Result Date: 06/14/2021 CLINICAL DATA:  Upper and lower extremity weakness EXAM: MRI CERVICAL, THORACIC AND LUMBAR SPINE WITHOUT CONTRAST TECHNIQUE: Multiplanar and multiecho pulse sequences of the cervical spine, to include the craniocervical junction and cervicothoracic junction, and thoracic and lumbar spine, were obtained without intravenous contrast. COMPARISON:  None. FINDINGS: MRI CERVICAL SPINE FINDINGS Alignment: Physiologic. Vertebrae: C4-7 ACDF Cord: Mildly increased T2-weighted signal within the spinal cord at the C3 level Posterior Fossa, vertebral arteries, paraspinal tissues: Negative Disc levels: At C3-4, there is a large central disc extrusion with superior migration causing severe spinal canal stenosis and compressing the spinal cord. There is no other cervical spinal canal stenosis or advance neural foraminal stenosis. MRI THORACIC SPINE FINDINGS Alignment:  Physiologic. Vertebrae: No fracture, evidence of discitis, or bone lesion. Cord:  Normal signal and morphology. Paraspinal and other soft tissues: Negative. Disc levels: T8-9 small central disc protrusion without stenosis. The other thoracic levels are unremarkable. MRI LUMBAR SPINE FINDINGS Segmentation:  Standard. Alignment:  Physiologic. Vertebrae: L2-5 PLIF with posterior decompression. No acute abnormality. Chronic height loss of L1. Conus medullaris and cauda equina: Conus extends to the L1 level. Paraspinal and other soft tissues: Left kidney is absent. Disc levels: T12-L1: Small left subarticular disc protrusion without spinal canal or neural foraminal stenosis. L1-L2: Large right subarticular  disc protrusion causing severe narrowing of the right lateral recess and  severe central spinal canal stenosis. This has worsened compared to 11/02/2018. No neural foraminal stenosis. L2-L3: Posterior fusion and decompression. No spinal canal stenosis. No neural foraminal stenosis. L3-L4: Posterior fusion and decompression. No spinal canal stenosis. No neural foraminal stenosis. L4-L5: Posterior fusion decompression. No spinal canal stenosis. No neural foraminal stenosis. L5-S1: Large central disc extrusion with inferior migration causing mass effect on the left S1 nerve root in the lateral recess. No central spinal canal stenosis. Severe bilateral neural foraminal stenosis. Visualized sacrum: Normal. IMPRESSION: 1. Large central disc extrusion at C3-4 causing severe spinal canal stenosis and compressing the spinal cord. Mildly increased T2-weighted signal within the spinal cord consistent with compressive myelopathy. 2. Large right subarticular disc protrusion at L1-L2 causing severe right lateral recess and severe central spinal canal stenosis. This has worsened compared to 04/21/202020. 3. Large central disc extrusion at L5-S1 with inferior migration causing mass effect on the left S1 nerve root in the lateral recess. Severe bilateral L5-S1 neural foraminal stenosis. Critical Value/emergent results were called by telephone at the time of interpretation on 06/14/2021 at 1:16 am to Dr. Florina Ou, Who verbally acknowledged these results. Electronically Signed   By: Ulyses Jarred M.D.   On: 06/14/2021 01:16   US RENAL  Result Date: 05/31/2021 CLINICAL DATA:  Cancer of the left renal pelvis. One episode of hematuria. EXAM: RENAL / URINARY TRACT ULTRASOUND COMPLETE COMPARISON:  CT abdomen pelvis dated 02/28/2016. FINDINGS: Right Kidney: Renal measurements: 11.7 x 4.2 x 1.4 cm = volume: 139 mL. Normal echogenicity. No hydronephrosis or shadowing stone. Left Kidney: Left nephrectomy. Bladder: Appears normal for degree of  bladder distention. Other: None. IMPRESSION: 1. Status post prior left nephrectomy. 2. Unremarkable right kidney and urinary bladder. Electronically Signed   By: Anner Crete M.D.   On: 05/31/2021 01:09   DG C-Arm 1-60 Min-No Report  Result Date: 06/15/2021 Fluoroscopy was utilized by the requesting physician.  No radiographic interpretation.   DG C-Arm 1-60 Min-No Report  Result Date: 06/15/2021 Fluoroscopy was utilized by the requesting physician.  No radiographic interpretation.     Subjective: No acute issues or events overnight, pain currently well controlled denies nausea vomiting diarrhea constipation headache fevers chills or chest pain   Discharge Exam: Vitals:   06/19/21 0400 06/19/21 0724  BP: (!) 156/68 (!) 150/71  Pulse: 89 98  Resp: 18 18  Temp: 98.2 F (36.8 C) 98.2 F (36.8 C)  SpO2: 98% 98%   Vitals:   06/18/21 1938 06/18/21 2359 06/19/21 0400 06/19/21 0724  BP: (!) 146/99 (!) 169/83 (!) 156/68 (!) 150/71  Pulse: 83 84 89 98  Resp: 16 18 18 18   Temp: 97.9 F (36.6 C) 97.9 F (36.6 C) 98.2 F (36.8 C) 98.2 F (36.8 C)  TempSrc: Oral Oral Oral Oral  SpO2: 98% 98% 98% 98%  Weight:      Height:        General: Pt is alert, awake, not in acute distress Cardiovascular: RRR, S1/S2 +, no rubs, no gallops Respiratory: CTA bilaterally, no wheezing, no rhonchi Abdominal: Soft, NT, ND, bowel sounds + Extremities: no edema, no cyanosis -mild paresthesias over the distal left upper extremity, 4 out of 5 strength bilateral upper extremities; 4 out of 5 strength lower extremities bilaterally    The results of significant diagnostics from this hospitalization (including imaging, microbiology, ancillary and laboratory) are listed below for reference.     Microbiology: Recent Results (from the past 240 hour(s))  Resp Panel by RT-PCR (Flu A&B,  Covid) Nasopharyngeal Swab     Status: None   Collection Time: 06/13/21  2:27 PM   Specimen: Nasopharyngeal Swab;  Nasopharyngeal(NP) swabs in vial transport medium  Result Value Ref Range Status   SARS Coronavirus 2 by RT PCR NEGATIVE NEGATIVE Final    Comment: (NOTE) SARS-CoV-2 target nucleic acids are NOT DETECTED.  The SARS-CoV-2 RNA is generally detectable in upper respiratory specimens during the acute phase of infection. The lowest concentration of SARS-CoV-2 viral copies this assay can detect is 138 copies/mL. A negative result does not preclude SARS-Cov-2 infection and should not be used as the sole basis for treatment or other patient management decisions. A negative result may occur with  improper specimen collection/handling, submission of specimen other than nasopharyngeal swab, presence of viral mutation(s) within the areas targeted by this assay, and inadequate number of viral copies(<138 copies/mL). A negative result must be combined with clinical observations, patient history, and epidemiological information. The expected result is Negative.  Fact Sheet for Patients:  EntrepreneurPulse.com.au  Fact Sheet for Healthcare Providers:  IncredibleEmployment.be  This test is no t yet approved or cleared by the [place] FDA and  has been authorized for detection and/or diagnosis of SARS-CoV-2 by FDA under an Emergency Use Authorization (EUA). This EUA will remain  in effect (meaning this test can be used) for the duration of the COVID-19 declaration under Section 564(b)(1) of the Act, 21 U.S.C.section 360bbb-3(b)(1), unless the authorization is terminated  or revoked sooner.       Influenza A by PCR NEGATIVE NEGATIVE Final   Influenza B by PCR NEGATIVE NEGATIVE Final    Comment: (NOTE) The Xpert Xpress SARS-CoV-2/FLU/RSV plus assay is intended as an aid in the diagnosis of influenza from Nasopharyngeal swab specimens and should not be used as a sole basis for treatment. Nasal washings and aspirates are unacceptable for Xpert Xpress  SARS-CoV-2/FLU/RSV testing.  Fact Sheet for Patients: EntrepreneurPulse.com.au  Fact Sheet for Healthcare Providers: IncredibleEmployment.be  This test is not yet approved or cleared by the [place] FDA and has been authorized for detection and/or diagnosis of SARS-CoV-2 by FDA under an Emergency Use Authorization (EUA). This EUA will remain in effect (meaning this test can be used) for the duration of the COVID-19 declaration under Section 564(b)(1) of the Act, 21 U.S.C. section 360bbb-3(b)(1), unless the authorization is terminated or revoked.  Performed at [facility], [place] [address] [place], [place] [address]   Surgical pcr screen     Status: Abnormal   Collection Time: 06/15/21 11:13 AM   Specimen: Nasal Mucosa; Nasal Swab  Result Value Ref Range Status   MRSA, PCR NEGATIVE NEGATIVE Final   Staphylococcus aureus POSITIVE (A) NEGATIVE Final    Comment: (NOTE) The Xpert SA Assay (FDA approved for NASAL specimens in patients 3 years of age and older), is one component of a comprehensive surveillance program. It is not intended to diagnose infection nor to guide or monitor treatment. Performed at [facility], [place] [address] [place], [place] [address]   Resp Panel by RT-PCR (Flu A&B, Covid) Nasopharyngeal Swab     Status: None   Collection Time: 06/19/21  7:07 AM   Specimen: Nasopharyngeal Swab; Nasopharyngeal(NP) swabs in vial transport medium  Result Value Ref Range Status   SARS Coronavirus 2 by RT PCR NEGATIVE NEGATIVE Final    Comment: (NOTE) SARS-CoV-2 target nucleic acids are NOT DETECTED.  The SARS-CoV-2 RNA is generally detectable in upper respiratory specimens during the acute  phase of infection. The lowest concentration of SARS-CoV-2 viral copies this assay can detect is 138 copies/mL. A negative result does not preclude SARS-Cov-2 infection and should not be used as the sole basis for  treatment or other patient management decisions. A negative result may occur with  improper specimen collection/handling, submission of specimen other than nasopharyngeal swab, presence of viral mutation(s) within the areas targeted by this assay, and inadequate number of viral copies(<138 copies/mL). A negative result must be combined with clinical observations, patient history, and epidemiological information. The expected result is Negative.  Fact Sheet for Patients:  EntrepreneurPulse.com.au  Fact Sheet for Healthcare Providers:  IncredibleEmployment.be  This test is no t yet approved or cleared by the [place] FDA and  has been authorized for detection and/or diagnosis of SARS-CoV-2 by FDA under an Emergency Use Authorization (EUA). This EUA will remain  in effect (meaning this test can be used) for the duration of the COVID-19 declaration under Section 564(b)(1) of the Act, 21 U.S.C.section 360bbb-3(b)(1), unless the authorization is terminated  or revoked sooner.       Influenza A by PCR NEGATIVE NEGATIVE Final   Influenza B by PCR NEGATIVE NEGATIVE Final    Comment: (NOTE) The Xpert Xpress SARS-CoV-2/FLU/RSV plus assay is intended as an aid in the diagnosis of influenza from Nasopharyngeal swab specimens and should not be used as a sole basis for treatment. Nasal washings and aspirates are unacceptable for Xpert Xpress SARS-CoV-2/FLU/RSV testing.  Fact Sheet for Patients: EntrepreneurPulse.com.au  Fact Sheet for Healthcare Providers: IncredibleEmployment.be  This test is not yet approved or cleared by the [place] FDA and has been authorized for detection and/or diagnosis of SARS-CoV-2 by FDA under an Emergency Use Authorization (EUA). This EUA will remain in effect (meaning this test can be used) for the duration of the COVID-19 declaration under Section 564(b)(1) of the Act, 21  U.S.C. section 360bbb-3(b)(1), unless the authorization is terminated or revoked.  Performed at [facility], [place] [address] [place], [place] [address]      Labs: BNP (last 3 results) Recent Labs    12/26/20 2147  BNP 46.5   Basic Metabolic Panel: Recent Labs  Lab 06/14/21 0445 06/15/21 0043 06/16/21 0152 06/17/21 0453 06/19/21 0539  NA 138 137 138 139 138  K 3.6 3.9 4.2 3.7 3.8  CL 107 105 107 105 101  CO2 24 23 23 25 25   GLUCOSE 84 92 144* 87 89  BUN 18 16 18 20 20   CREATININE 0.99 1.07* 0.99 0.97 1.09*  CALCIUM 7.9* 7.7* 8.2* 8.3* 8.7*   Liver Function Tests: Recent Labs  Lab 06/13/21 1304  AST 29  ALT 19  ALKPHOS 100  BILITOT 0.6  PROT 7.0  ALBUMIN 3.8   No results for input(s): LIPASE, AMYLASE in the last 168 hours. Recent Labs  Lab 06/13/21 1420  AMMONIA 18   CBC: Recent Labs  Lab 06/13/21 1304 06/14/21 0445 06/15/21 0043 06/16/21 0152 06/17/21 0453 06/19/21 0539  WBC 10.2 7.7 7.4 8.1 8.6 7.1  NEUTROABS 8.9*  --   --   --   --   --   HGB 11.9* 11.5* 10.5* 11.7* 11.1* 12.3  HCT 38.0 36.4 34.0* 35.9* 34.8* 38.3  MCV 97.7 98.6 97.1 94.5 94.6 94.8  PLT 166 141* 137* 147* 146* 156   Cardiac Enzymes: No results for input(s): CKTOTAL, CKMB, CKMBINDEX, TROPONINI in the last 168 hours. BNP: Invalid input(s): POCBNP CBG: Recent Labs  Lab 06/18/21 0657 06/18/21  1308 06/18/21 1622 06/18/21 2129 06/19/21 0614  GLUCAP 97 108* 121* 105* 91   D-Dimer No results for input(s): DDIMER in the last 72 hours. Hgb A1c No results for input(s): HGBA1C in the last 72 hours. Lipid Profile No results for input(s): CHOL, HDL, LDLCALC, TRIG, CHOLHDL, LDLDIRECT in the last 72 hours. Thyroid function studies No results for input(s): TSH, T4TOTAL, T3FREE, THYROIDAB in the last 72 hours.  Invalid input(s): FREET3 Anemia work up No results for input(s): VITAMINB12, FOLATE, FERRITIN, TIBC, IRON, RETICCTPCT in the last 72 hours. Urinalysis     Component Value Date/Time   COLORURINE YELLOW 06/13/2021 2049   APPEARANCEUR CLEAR 06/13/2021 2049   LABSPEC 1.012 06/13/2021 2049   PHURINE 6.0 06/13/2021 2049   GLUCOSEU NEGATIVE 06/13/2021 2049   HGBUR NEGATIVE 06/13/2021 2049   [place] NEGATIVE 06/13/2021 2049   KETONESUR NEGATIVE 06/13/2021 2049   PROTEINUR NEGATIVE 06/13/2021 2049   UROBILINOGEN 0.2 02/01/2011 1327   NITRITE NEGATIVE 06/13/2021 2049   LEUKOCYTESUR NEGATIVE 06/13/2021 2049   Sepsis Labs Invalid input(s): PROCALCITONIN,  WBC,  LACTICIDVEN Microbiology Recent Results (from the past 240 hour(s))  Resp Panel by RT-PCR (Flu A&B, Covid) Nasopharyngeal Swab     Status: None   Collection Time: 06/13/21  2:27 PM   Specimen: Nasopharyngeal Swab; Nasopharyngeal(NP) swabs in vial transport medium  Result Value Ref Range Status   SARS Coronavirus 2 by RT PCR NEGATIVE NEGATIVE Final    Comment: (NOTE) SARS-CoV-2 target nucleic acids are NOT DETECTED.  The SARS-CoV-2 RNA is generally detectable in upper respiratory specimens during the acute phase of infection. The lowest concentration of SARS-CoV-2 viral copies this assay can detect is 138 copies/mL. A negative result does not preclude SARS-Cov-2 infection and should not be used as the sole basis for treatment or other patient management decisions. A negative result may occur with  improper specimen collection/handling, submission of specimen other than nasopharyngeal swab, presence of viral mutation(s) within the areas targeted by this assay, and inadequate number of viral copies(<138 copies/mL). A negative result must be combined with clinical observations, patient history, and epidemiological information. The expected result is Negative.  Fact Sheet for Patients:  EntrepreneurPulse.com.au  Fact Sheet for Healthcare Providers:  IncredibleEmployment.be  This test is no t yet approved or cleared by the [place] FDA and   has been authorized for detection and/or diagnosis of SARS-CoV-2 by FDA under an Emergency Use Authorization (EUA). This EUA will remain  in effect (meaning this test can be used) for the duration of the COVID-19 declaration under Section 564(b)(1) of the Act, 21 U.S.C.section 360bbb-3(b)(1), unless the authorization is terminated  or revoked sooner.       Influenza A by PCR NEGATIVE NEGATIVE Final   Influenza B by PCR NEGATIVE NEGATIVE Final    Comment: (NOTE) The Xpert Xpress SARS-CoV-2/FLU/RSV plus assay is intended as an aid in the diagnosis of influenza from Nasopharyngeal swab specimens and should not be used as a sole basis for treatment. Nasal washings and aspirates are unacceptable for Xpert Xpress SARS-CoV-2/FLU/RSV testing.  Fact Sheet for Patients: EntrepreneurPulse.com.au  Fact Sheet for Healthcare Providers: IncredibleEmployment.be  This test is not yet approved or cleared by the [place] FDA and has been authorized for detection and/or diagnosis of SARS-CoV-2 by FDA under an Emergency Use Authorization (EUA). This EUA will remain in effect (meaning this test can be used) for the duration of the COVID-19 declaration under Section 564(b)(1) of the Act, 21 U.S.C. section 360bbb-3(b)(1), unless the authorization  is terminated or revoked.  Performed at [facility], [place] [address] [place], [place] [address]   Surgical pcr screen     Status: Abnormal   Collection Time: 06/15/21 11:13 AM   Specimen: Nasal Mucosa; Nasal Swab  Result Value Ref Range Status   MRSA, PCR NEGATIVE NEGATIVE Final   Staphylococcus aureus POSITIVE (A) NEGATIVE Final    Comment: (NOTE) The Xpert SA Assay (FDA approved for NASAL specimens in patients 57 years of age and older), is one component of a comprehensive surveillance program. It is not intended to diagnose infection nor to guide or monitor treatment. Performed at [facility], [place] [address], [place], [place] [address]   Resp Panel by RT-PCR (Flu A&B, Covid) Nasopharyngeal Swab     Status: None   Collection Time: 06/19/21  7:07 AM   Specimen: Nasopharyngeal Swab; Nasopharyngeal(NP) swabs in vial transport medium  Result Value Ref Range Status   SARS Coronavirus 2 by RT PCR NEGATIVE NEGATIVE Final    Comment: (NOTE) SARS-CoV-2 target nucleic acids are NOT DETECTED.  The SARS-CoV-2 RNA is generally detectable in upper respiratory specimens during the acute phase of infection. The lowest concentration of SARS-CoV-2 viral copies this assay can detect is 138 copies/mL. A negative result does not preclude SARS-Cov-2 infection and should not be used as the sole basis for treatment or other patient management decisions. A negative result may occur with  improper specimen collection/handling, submission of specimen other than nasopharyngeal swab, presence of viral mutation(s) within the areas targeted by this assay, and inadequate number of viral copies(<138 copies/mL). A negative result must be combined with clinical observations, patient history, and epidemiological information. The expected result is Negative.  Fact Sheet for Patients:  EntrepreneurPulse.com.au  Fact Sheet for Healthcare Providers:  IncredibleEmployment.be  This test is no t yet approved or cleared by the [place] FDA and  has been authorized for detection and/or diagnosis of SARS-CoV-2 by FDA under an Emergency Use Authorization (EUA). This EUA will remain  in effect (meaning this test can be used) for the duration of the COVID-19 declaration under Section 564(b)(1) of the Act, 21 U.S.C.section 360bbb-3(b)(1), unless the authorization is terminated  or revoked sooner.       Influenza A by PCR NEGATIVE NEGATIVE Final   Influenza B by PCR NEGATIVE NEGATIVE Final    Comment: (NOTE) The Xpert Xpress SARS-CoV-2/FLU/RSV plus assay is  intended as an aid in the diagnosis of influenza from Nasopharyngeal swab specimens and should not be used as a sole basis for treatment. Nasal washings and aspirates are unacceptable for Xpert Xpress SARS-CoV-2/FLU/RSV testing.  Fact Sheet for Patients: EntrepreneurPulse.com.au  Fact Sheet for Healthcare Providers: IncredibleEmployment.be  This test is not yet approved or cleared by the [place] FDA and has been authorized for detection and/or diagnosis of SARS-CoV-2 by FDA under an Emergency Use Authorization (EUA). This EUA will remain in effect (meaning this test can be used) for the duration of the COVID-19 declaration under Section 564(b)(1) of the Act, 21 U.S.C. section 360bbb-3(b)(1), unless the authorization is terminated or revoked.  Performed at [facility], [place] [address] [place], [place] [address]      Time coordinating discharge: Over 30 minutes  SIGNED:   Holli Humbles, DO [facility] 06/19/2021, 9:36 AM Pager   If 7PM-7AM, please contact night-coverage [email or web address]

## 2021-06-18 NOTE — Progress Notes (Signed)
  NEUROSURGERY PROGRESS NOTE   No issues overnight. No complaints this am.  EXAM:  BP (!) 146/61 (BP Location: Right Arm)   Pulse 96   Temp 98.4 F (36.9 C) (Oral)   Resp 17   Ht 5\' 3"  (1.6 m)   Wt 77.1 kg   SpO2 98%   BMI 30.11 kg/m   Awake, alert Speech fluent CN grossly intact  Motor exam: Upper Extremities Deltoid Bicep Tricep Grip  Right 4+/5 4+/5 4+/5 4/5  Left 4/5 4/5 4/5 4/5    Lower Extremities IP Quad PF DF EHL  Right 4+/5 4/5 4/5 4/5 4/5  Left 4+/5 4/5 4/5 4/5 4/5    IMPRESSION:  75 y.o. female POD#3 C3-4 ACDF, cont to make good improvements in motor exam.  PLAN: - Cont  PT/OT  - Dispo planning, likely SNF - Cont supportive care per primary service   Consuella Lose, MD [facility]

## 2021-06-19 DIAGNOSIS — Z7982 Long term (current) use of aspirin: Secondary | ICD-10-CM | POA: Diagnosis not present

## 2021-06-19 DIAGNOSIS — H04123 Dry eye syndrome of bilateral lacrimal glands: Secondary | ICD-10-CM | POA: Diagnosis not present

## 2021-06-19 DIAGNOSIS — R52 Pain, unspecified: Secondary | ICD-10-CM | POA: Diagnosis not present

## 2021-06-19 DIAGNOSIS — S0990XA Unspecified injury of head, initial encounter: Secondary | ICD-10-CM | POA: Diagnosis not present

## 2021-06-19 DIAGNOSIS — Z7401 Bed confinement status: Secondary | ICD-10-CM | POA: Diagnosis not present

## 2021-06-19 DIAGNOSIS — S199XXA Unspecified injury of neck, initial encounter: Secondary | ICD-10-CM | POA: Diagnosis not present

## 2021-06-19 DIAGNOSIS — W19XXXD Unspecified fall, subsequent encounter: Secondary | ICD-10-CM | POA: Diagnosis not present

## 2021-06-19 DIAGNOSIS — W19XXXA Unspecified fall, initial encounter: Secondary | ICD-10-CM | POA: Diagnosis not present

## 2021-06-19 DIAGNOSIS — R29898 Other symptoms and signs involving the musculoskeletal system: Secondary | ICD-10-CM | POA: Diagnosis not present

## 2021-06-19 DIAGNOSIS — M549 Dorsalgia, unspecified: Secondary | ICD-10-CM | POA: Diagnosis not present

## 2021-06-19 DIAGNOSIS — R6889 Other general symptoms and signs: Secondary | ICD-10-CM | POA: Diagnosis not present

## 2021-06-19 DIAGNOSIS — E039 Hypothyroidism, unspecified: Secondary | ICD-10-CM | POA: Diagnosis not present

## 2021-06-19 DIAGNOSIS — G8929 Other chronic pain: Secondary | ICD-10-CM | POA: Diagnosis not present

## 2021-06-19 DIAGNOSIS — M545 Low back pain, unspecified: Secondary | ICD-10-CM | POA: Diagnosis not present

## 2021-06-19 DIAGNOSIS — I129 Hypertensive chronic kidney disease with stage 1 through stage 4 chronic kidney disease, or unspecified chronic kidney disease: Secondary | ICD-10-CM | POA: Diagnosis not present

## 2021-06-19 DIAGNOSIS — I10 Essential (primary) hypertension: Secondary | ICD-10-CM | POA: Diagnosis not present

## 2021-06-19 DIAGNOSIS — M542 Cervicalgia: Secondary | ICD-10-CM | POA: Diagnosis not present

## 2021-06-19 DIAGNOSIS — M4802 Spinal stenosis, cervical region: Secondary | ICD-10-CM | POA: Diagnosis not present

## 2021-06-19 DIAGNOSIS — R2681 Unsteadiness on feet: Secondary | ICD-10-CM | POA: Diagnosis not present

## 2021-06-19 DIAGNOSIS — N1831 Chronic kidney disease, stage 3a: Secondary | ICD-10-CM | POA: Diagnosis not present

## 2021-06-19 DIAGNOSIS — R0902 Hypoxemia: Secondary | ICD-10-CM | POA: Diagnosis not present

## 2021-06-19 DIAGNOSIS — Z743 Need for continuous supervision: Secondary | ICD-10-CM | POA: Diagnosis not present

## 2021-06-19 DIAGNOSIS — M2578 Osteophyte, vertebrae: Secondary | ICD-10-CM | POA: Diagnosis not present

## 2021-06-19 DIAGNOSIS — E559 Vitamin D deficiency, unspecified: Secondary | ICD-10-CM | POA: Diagnosis not present

## 2021-06-19 DIAGNOSIS — G959 Disease of spinal cord, unspecified: Secondary | ICD-10-CM | POA: Diagnosis not present

## 2021-06-19 DIAGNOSIS — G3184 Mild cognitive impairment, so stated: Secondary | ICD-10-CM | POA: Diagnosis not present

## 2021-06-19 DIAGNOSIS — W06XXXA Fall from bed, initial encounter: Secondary | ICD-10-CM | POA: Diagnosis not present

## 2021-06-19 DIAGNOSIS — Z981 Arthrodesis status: Secondary | ICD-10-CM | POA: Diagnosis not present

## 2021-06-19 DIAGNOSIS — E119 Type 2 diabetes mellitus without complications: Secondary | ICD-10-CM | POA: Diagnosis not present

## 2021-06-19 DIAGNOSIS — M48062 Spinal stenosis, lumbar region with neurogenic claudication: Secondary | ICD-10-CM | POA: Diagnosis not present

## 2021-06-19 DIAGNOSIS — Z79899 Other long term (current) drug therapy: Secondary | ICD-10-CM | POA: Diagnosis not present

## 2021-06-19 DIAGNOSIS — M069 Rheumatoid arthritis, unspecified: Secondary | ICD-10-CM | POA: Diagnosis not present

## 2021-06-19 DIAGNOSIS — M4322 Fusion of spine, cervical region: Secondary | ICD-10-CM | POA: Diagnosis not present

## 2021-06-19 DIAGNOSIS — R404 Transient alteration of awareness: Secondary | ICD-10-CM | POA: Diagnosis not present

## 2021-06-19 DIAGNOSIS — R7989 Other specified abnormal findings of blood chemistry: Secondary | ICD-10-CM | POA: Diagnosis not present

## 2021-06-19 DIAGNOSIS — R252 Cramp and spasm: Secondary | ICD-10-CM | POA: Diagnosis not present

## 2021-06-19 DIAGNOSIS — R1312 Dysphagia, oropharyngeal phase: Secondary | ICD-10-CM | POA: Diagnosis not present

## 2021-06-19 DIAGNOSIS — M5090 Cervical disc disorder, unspecified, unspecified cervical region: Secondary | ICD-10-CM | POA: Diagnosis not present

## 2021-06-19 DIAGNOSIS — F32A Depression, unspecified: Secondary | ICD-10-CM | POA: Diagnosis not present

## 2021-06-19 DIAGNOSIS — E1122 Type 2 diabetes mellitus with diabetic chronic kidney disease: Secondary | ICD-10-CM | POA: Diagnosis not present

## 2021-06-19 DIAGNOSIS — M48061 Spinal stenosis, lumbar region without neurogenic claudication: Secondary | ICD-10-CM | POA: Diagnosis not present

## 2021-06-19 DIAGNOSIS — G629 Polyneuropathy, unspecified: Secondary | ICD-10-CM | POA: Diagnosis not present

## 2021-06-19 DIAGNOSIS — R262 Difficulty in walking, not elsewhere classified: Secondary | ICD-10-CM | POA: Diagnosis not present

## 2021-06-19 DIAGNOSIS — M6281 Muscle weakness (generalized): Secondary | ICD-10-CM | POA: Diagnosis not present

## 2021-06-19 DIAGNOSIS — K5900 Constipation, unspecified: Secondary | ICD-10-CM | POA: Diagnosis not present

## 2021-06-19 LAB — GLUCOSE, CAPILLARY
Glucose-Capillary: 91 mg/dL (ref 70–99)
Glucose-Capillary: 131 mg/dL — ABNORMAL HIGH (ref 70–99)

## 2021-06-19 LAB — RESP PANEL BY RT-PCR (FLU A&B, COVID) ARPGX2
Influenza A by PCR: NEGATIVE
Influenza B by PCR: NEGATIVE
SARS Coronavirus 2 by RT PCR: NEGATIVE

## 2021-06-19 LAB — CBC
HCT: 38.3 % (ref 36.0–46.0)
Hemoglobin: 12.3 g/dL (ref 12.0–15.0)
MCH: 30.4 pg (ref 26.0–34.0)
MCHC: 32.1 g/dL (ref 30.0–36.0)
MCV: 94.8 fL (ref 80.0–100.0)
Platelets: 156 10*3/uL (ref 150–400)
RBC: 4.04 MIL/uL (ref 3.87–5.11)
RDW: 13.9 % (ref 11.5–15.5)
WBC: 7.1 10*3/uL (ref 4.0–10.5)
nRBC: 0 % (ref 0.0–0.2)

## 2021-06-19 LAB — BASIC METABOLIC PANEL
Anion gap: 12 (ref 5–15)
BUN: 20 mg/dL (ref 8–23)
CO2: 25 mmol/L (ref 22–32)
Calcium: 8.7 mg/dL — ABNORMAL LOW (ref 8.9–10.3)
Chloride: 101 mmol/L (ref 98–111)
Creatinine, Ser: 1.09 mg/dL — ABNORMAL HIGH (ref 0.44–1.00)
GFR, Estimated: 53 mL/min — ABNORMAL LOW (ref >60)
Glucose, Bld: 89 mg/dL (ref 70–99)
Potassium: 3.8 mmol/L (ref 3.5–5.1)
Sodium: 138 mmol/L (ref 135–145)

## 2021-06-19 MED ORDER — HYDROCODONE-ACETAMINOPHEN 5-325 MG PO TABS: 1.0000 | ORAL_TABLET | ORAL | 0 refills | Status: AC | PRN

## 2021-06-19 NOTE — Progress Notes (Addendum)
Physical Therapy Treatment Patient Details Name: [name] MRN: [id number] DOB: 1945-08-27 Today's Date: 06/19/2021   History of Present Illness 75 y.o. female admitted 12/1 for requent falls and bilat hand numbness, she is now  C3-4 ACDF 12/3. PMH: medical history significant for depression, anxiety, cognitive impairment, hypertension, rheumatoid arthritis, and diabetes mellitus    PT Comments    Pt received in supine and agreeable to therapy session. Pt c/o severe pain in generalized neck area and LLE, pt received IV pain medication prior to PT session, pt then stated 4/10 pain once seated EOB. Pt with increased restlessness today, bilateral arms and legs twitching throughout entire session, affected seated balance and buckling when standing due to restlessness, MD notified. Pt performed side steps to [facility] today with maxA+2 to shift weight and step safely. Continue to recommend SNF level therapies upon DC, pt may benefit from further cognitive assessment in acute or post-acute settings due to poor insight into safety/deficits. Pt continues to benefit from PT services to progress toward functional mobility goals.      Recommendations for follow up therapy are one component of a multi-disciplinary discharge planning process, led by the attending physician.  Recommendations may be updated based on patient status, additional functional criteria and insurance authorization.  Follow Up Recommendations  Skilled nursing-short term rehab (<3 hours/day)     Assistance Recommended at Discharge Frequent or constant Supervision/Assistance  Equipment Recommendations   (TBD at next venue)    Recommendations for Other Services       Precautions / Restrictions Precautions Precautions: Fall;Cervical Precaution Booklet Issued: No Precaution Comments: verbal cues during functional tasks, pt with poor memory Restrictions Weight Bearing Restrictions: No     Mobility  Bed Mobility Overal bed  mobility: Needs Assistance Bed Mobility: Rolling;Sidelying to Sit;Sit to Sidelying Rolling: Mod assist Sidelying to sit: Mod assist;+2 for physical assistance    Sit to sidelying: Mod assist;+2 for physical assistance General bed mobility comments: max direcitonal verbal cues and encouragement, modA+2 with sitting for LE management and trunk elevation, cues for log rolling    Transfers Overall transfer level: Needs assistance Equipment used: 2 person hand held assist Transfers: Sit to/from Stand Sit to Stand: Max assist;+2 physical assistance;+2 safety/equipment   General transfer comment: max A +2 to power into standing with bilat knees blocked. pt with extreme fear of falling and benefits from simple one step verbal cues to sequence through task. Pts restless legs result in knee buckling. Able to take side steps towards [facility] with max assist to weight shift onto RLE and move LLE    Ambulation/Gait   General Gait Details: unable this date Pre-gait: nearly totalA to weight shift and block B knees while attempting shuffled sidesteps x4 total    Balance Overall balance assessment: Needs assistance;History of Falls Sitting-balance support: Feet supported Sitting balance-Leahy Scale: Poor Sitting balance - Comments: minA due to fear of falling, L lateral lean with cues to lean R, pt able to tolerate sitting for ~10 min and perform seated scooting toward HOB with +2 modA. Restlessness/body twitching greatly affecting seated balance. Postural control: Left lateral lean Standing balance support: Bilateral upper extremity supported Standing balance-Leahy Scale: Poor Standing balance comment: reliant on +2 external assist, fear of falling, anxiety, and restlessness      Cognition Arousal/Alertness: Awake/alert Behavior During Therapy: Anxious;Impulsive;Restless Overall Cognitive Status: History of cognitive impairments - at baseline   General Comments: pt very anxious and more restless  this session, arms and legs restlessly moving  during entire session, pt has fear of falling       Exercises Other Exercises Other Exercises: Seated: LAQ x10 Other Exercises: STS x3 and static standing    General Comments General comments (skin integrity, edema, etc.): VSS on RA, weaned pt off O2 and SPo2 remained >96% throughout session      Pertinent Vitals/Pain Pain Assessment: 0-10 Pain Score: 4  Pain Location: neck, generalized, L leg Pain Intervention(s): Premedicated before session;Monitored during session     PT Goals (current goals can now be found in the care plan section) Acute Rehab PT Goals Patient Stated Goal: get better PT Goal Formulation: With patient Time For Goal Achievement: 07/01/21 Progress towards PT goals: Progressing toward goals    Frequency    Min 5X/week      PT Plan Current plan remains appropriate       AM-PAC PT "6 Clicks" Mobility   Outcome Measure  Help needed turning from your back to your side while in a flat bed without using bedrails?: A Lot (mod+ cues needed for this and all below) Help needed moving from lying on your back to sitting on the side of a flat bed without using bedrails?: A Lot Help needed moving to and from a bed to a chair (including a wheelchair)?: A Lot Help needed standing up from a chair using your arms (e.g., wheelchair or bedside chair)?: A Lot Help needed to walk in hospital room?: A Lot Help needed climbing 3-5 steps with a railing? : Total 6 Click Score: 11    End of Session Equipment Utilized During Treatment: Gait belt Activity Tolerance:  (limited by anxiety) Patient left: with call bell/phone within reach;with bed alarm set;in bed;Other (comment) (on bed pan, RN/NT notified) Nurse Communication: Mobility status PT Visit Diagnosis: Unsteadiness on feet (R26.81);Muscle weakness (generalized) (M62.81);Difficulty in walking, not elsewhere classified (R26.2)     Time: 0388-8280 PT Time Calculation  (min) (ACUTE ONLY): 22 min  Charges:  $Therapeutic Exercise: 8-22 mins                     Evelene Croon, Student PTA CI: Carly P., PTA  [name] 06/19/2021, 10:25 AM

## 2021-06-19 NOTE — Progress Notes (Signed)
Pt wheeled off unit by PTAR.  

## 2021-06-19 NOTE — TOC Transition Note (Signed)
Transition of Care [facility]) - CM/SW Discharge Note   Patient Details  Name: [name] MRN: [id number] Date of Birth: [birth year]  Transition of Care [facility]) CM/SW Contact:  Geralynn Ochs, LCSW Phone Number: 06/19/2021, 11:09 AM   Clinical Narrative:   Nurse to call report to [phone], Room 3214.  Transport scheduled for 3:30 PM.    Final next level of care: [place] Barriers to Discharge: Barriers Resolved   Patient Goals and CMS Choice Patient states their goals for this hospitalization and ongoing recovery are:: Pt states her goal is to become more independent. CMS Medicare.gov Compare Post Acute Care list provided to:: Patient Choice offered to / list presented to : Patient  Discharge Placement              Patient [facility] bed at: [facility] Patient to be transferred to facility by: [facility] Name of family member notified: [name] Patient and family notified of of transfer: 06/19/21  Discharge Plan and Services In-house Referral: Clinical Social Work   Post Acute Care Choice: [place]                               Social Determinants of Health (SDOH) Interventions     Readmission Risk Interventions No flowsheet data found.

## 2021-06-19 NOTE — Progress Notes (Signed)
Called report to kim at [facility].

## 2021-06-20 ENCOUNTER — Emergency Department (HOSPITAL_COMMUNITY): Payer: Medicare Other

## 2021-06-20 ENCOUNTER — Encounter (HOSPITAL_COMMUNITY): Payer: Self-pay

## 2021-06-20 ENCOUNTER — Emergency Department (HOSPITAL_COMMUNITY)
Admission: EM | Admit: 2021-06-20 | Discharge: 2021-06-21 | Disposition: A | Discharge status: Home / Self Care | Payer: Medicare Other | Attending: Emergency Medicine | Admitting: Emergency Medicine

## 2021-06-20 DIAGNOSIS — M542 Cervicalgia: Secondary | ICD-10-CM | POA: Diagnosis not present

## 2021-06-20 DIAGNOSIS — M545 Low back pain, unspecified: Secondary | ICD-10-CM | POA: Diagnosis not present

## 2021-06-20 DIAGNOSIS — W19XXXD Unspecified fall, subsequent encounter: Secondary | ICD-10-CM | POA: Diagnosis not present

## 2021-06-20 DIAGNOSIS — K5900 Constipation, unspecified: Secondary | ICD-10-CM | POA: Diagnosis not present

## 2021-06-20 DIAGNOSIS — M4802 Spinal stenosis, cervical region: Secondary | ICD-10-CM | POA: Diagnosis not present

## 2021-06-20 DIAGNOSIS — I10 Essential (primary) hypertension: Secondary | ICD-10-CM | POA: Diagnosis not present

## 2021-06-20 DIAGNOSIS — E119 Type 2 diabetes mellitus without complications: Secondary | ICD-10-CM | POA: Diagnosis not present

## 2021-06-20 DIAGNOSIS — R6889 Other general symptoms and signs: Secondary | ICD-10-CM | POA: Diagnosis not present

## 2021-06-20 DIAGNOSIS — W19XXXA Unspecified fall, initial encounter: Secondary | ICD-10-CM

## 2021-06-20 DIAGNOSIS — R252 Cramp and spasm: Secondary | ICD-10-CM | POA: Diagnosis not present

## 2021-06-20 DIAGNOSIS — M4322 Fusion of spine, cervical region: Secondary | ICD-10-CM | POA: Diagnosis not present

## 2021-06-20 DIAGNOSIS — G629 Polyneuropathy, unspecified: Secondary | ICD-10-CM | POA: Diagnosis not present

## 2021-06-20 DIAGNOSIS — E039 Hypothyroidism, unspecified: Secondary | ICD-10-CM | POA: Diagnosis not present

## 2021-06-20 DIAGNOSIS — M549 Dorsalgia, unspecified: Secondary | ICD-10-CM | POA: Diagnosis not present

## 2021-06-20 DIAGNOSIS — Z79899 Other long term (current) drug therapy: Secondary | ICD-10-CM | POA: Diagnosis not present

## 2021-06-20 DIAGNOSIS — N1831 Chronic kidney disease, stage 3a: Secondary | ICD-10-CM | POA: Diagnosis not present

## 2021-06-20 DIAGNOSIS — Z743 Need for continuous supervision: Secondary | ICD-10-CM | POA: Diagnosis not present

## 2021-06-20 DIAGNOSIS — M069 Rheumatoid arthritis, unspecified: Secondary | ICD-10-CM | POA: Diagnosis not present

## 2021-06-20 DIAGNOSIS — Z7982 Long term (current) use of aspirin: Secondary | ICD-10-CM | POA: Insufficient documentation

## 2021-06-20 DIAGNOSIS — Z981 Arthrodesis status: Secondary | ICD-10-CM | POA: Diagnosis not present

## 2021-06-20 DIAGNOSIS — W06XXXA Fall from bed, initial encounter: Secondary | ICD-10-CM | POA: Diagnosis not present

## 2021-06-20 DIAGNOSIS — S0990XA Unspecified injury of head, initial encounter: Secondary | ICD-10-CM | POA: Diagnosis not present

## 2021-06-20 DIAGNOSIS — I129 Hypertensive chronic kidney disease with stage 1 through stage 4 chronic kidney disease, or unspecified chronic kidney disease: Secondary | ICD-10-CM | POA: Diagnosis not present

## 2021-06-20 DIAGNOSIS — M2578 Osteophyte, vertebrae: Secondary | ICD-10-CM | POA: Diagnosis not present

## 2021-06-20 DIAGNOSIS — R0902 Hypoxemia: Secondary | ICD-10-CM | POA: Diagnosis not present

## 2021-06-20 DIAGNOSIS — E1122 Type 2 diabetes mellitus with diabetic chronic kidney disease: Secondary | ICD-10-CM | POA: Insufficient documentation

## 2021-06-20 DIAGNOSIS — S199XXA Unspecified injury of neck, initial encounter: Secondary | ICD-10-CM | POA: Diagnosis not present

## 2021-06-20 DIAGNOSIS — F32A Depression, unspecified: Secondary | ICD-10-CM | POA: Diagnosis not present

## 2021-06-20 DIAGNOSIS — E559 Vitamin D deficiency, unspecified: Secondary | ICD-10-CM | POA: Diagnosis not present

## 2021-06-20 NOTE — ED Notes (Signed)
PTAR CALLED PT IS NUMBER [phone] ON THE LIST

## 2021-06-20 NOTE — ED Notes (Signed)
Talkative, friendly, brief dry, no distress.

## 2021-06-20 NOTE — ED Triage Notes (Signed)
Pt BIB EMS after unwitnessed fall at [facility] facility. She rolled out of bed and landed on top of her head, she is not on blood thinners, denies LOC, was looking at something on the side of the bed and rolled out approx 2 ft., pt was on the  floor for an hr., 2 weeks ago she had a surgery that removed C3 and C4, pt endorses neck and back pain, she is a/ox4

## 2021-06-20 NOTE — ED Provider Notes (Signed)
[facility] EMERGENCY DEPARTMENT Provider Note   CSN: [id number] Arrival date & time: 06/20/21  1410     History Chief Complaint  Patient presents with   [name]    Julia VANDERWEELE is a 75 y.o. female.  Presented to the emergency room with concern for fall.  Patient reports that she accidentally fell out of her bed while she was leaning over, denies loss consciousness.  Did hit her head.  States that her primary trauma was to her head.  Initially had some neck pain but denies any ongoing neck pain at present.  Does have some low back pain.  Pain is currently mild to moderate.  Worse with movement and improved with rest.  Aching.  HPI     Past Medical History:  Diagnosis Date   Anxiety    Arthritis    rhematoid,osteoarthritis   Diabetes mellitus without complication (HCC)    Dyspnea    increased exertion   Elevated cholesterol    Hemorrhoids    Hypertension    Hypothyroidism    Joint pain    Nocturia    Sleeping difficulties     Patient Active Problem List   Diagnosis Date Noted   Cervical myelopathy ([place]) 06/14/2021   Anxiety    Stage 3a chronic kidney disease (CKD) ([place])    Cervical stenosis of spinal canal    Hyperlipidemia associated with type 2 diabetes mellitus ([place]) 03/14/2021   Acute respiratory failure with hypoxia ([place]) 01/01/2021   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   Cognitive impairment 04/17/2019   Degeneration of lumbar intervertebral disc 10/15/2018   MDD (major depressive disorder), recurrent episode, severe ([place]) 08/10/2018   MDD (major depressive disorder), recurrent, severe, with psychosis ([place])    Altered mental status 05/24/2018   Ketonuria 05/24/2018   Pain in right hand 09/11/2017   Hypertension 03/07/2014   Hyperlipidemia 03/07/2014   Depression 03/07/2014   Spinal stenosis of lumbar region with neurogenic claudication 02/24/2014   Type 2 diabetes mellitus without complication ([place]) 40/98/1191   Rheumatoid arthritis ([place])  02/24/2014   Scoliosis of lumbar spine 02/13/2014    Past Surgical History:  Procedure Laterality Date   ABDOMINAL HYSTERECTOMY     ANTERIOR CERVICAL DECOMP/DISCECTOMY FUSION N/A 06/15/2021   Procedure: ANTERIOR CERVICAL DECOMPRESSION/DISCECTOMY FUSION CERVICAL THREE-FOUR;  Surgeon: Consuella Lose, MD;  Location: [facility];  Service: Neurosurgery;  Laterality: N/A;   BACK SURGERY     CERVICAL FUSION  2004   CHOLECYSTECTOMY     EYE SURGERY Left    cataract   PARS PLANA REPAIR OF RETINAL DEATACHMENT     ROBOT ASSITED LAPAROSCOPIC NEPHROURETERECTOMY Left 12/26/2020   Procedure: XI ROBOT ASSITED LAPAROSCOPIC  LEFT NEPHROURETERECTOMY/ STENT PLACEMENT/ TRANSURETHRAL RESECTION OF LEFT URETERAL ORIFICE;EXCISION OF SPLENULE;  Surgeon: Ceasar Mons, MD;  Location: [facility];  Service: Urology;  Laterality: Left;   THYROIDECTOMY, PARTIAL       OB History   No obstetric history on file.     Family History  Problem Relation Age of Onset   Diabetes Mother    Breast cancer Mother    Gout Mother    Alzheimer's disease Father    Heart Problems Father    Breast cancer Sister    Alzheimer's disease Sister    Diabetes Sister    Gout Sister    Heart Problems Sister    Alzheimer's disease Brother    Diabetes Brother    Heart Problems Brother     Social History  Tobacco Use   Smoking status: Never   Smokeless tobacco: Never  Vaping Use   Vaping Use: Never used  Substance Use Topics   Alcohol use: No   Drug use: No    Home Medications Prior to Admission medications   Medication Sig Start Date End Date Taking? Authorizing Provider  ACCU-CHEK GUIDE test strip CHECK BLOOD SUGAR ONCE A DAY 10/29/17   [provider]  aspirin 81 MG chewable tablet Chew 81 mg by mouth daily. 05/24/21   [provider]  benztropine (COGENTIN) 0.5 MG tablet Take 0.5 mg by mouth 2 (two) times daily.    [provider]  escitalopram (LEXAPRO) 10 MG tablet Take 10 mg by  mouth daily. Patient not taking: Reported on 06/14/2021    [provider]  HYDROcodone-acetaminophen (NORCO/VICODIN) 5-325 MG tablet Take 1 tablet by mouth every 4 (four) hours as needed for moderate pain. 06/19/21 06/19/22  Caren Griffins, MD  hydroxychloroquine (PLAQUENIL) 200 MG tablet Take 400 mg by mouth 2 (two) times daily.    [provider]  leflunomide (ARAVA) 20 MG tablet Take 20 mg by mouth daily.    [provider]  metoprolol succinate (TOPROL-XL) 25 MG 24 hr tablet Take 25 mg by mouth daily.    [provider]  OLANZapine (ZYPREXA) 2.5 MG tablet Take 2.5 mg by mouth daily.    [provider]  polyethylene glycol (MIRALAX / GLYCOLAX) 17 g packet Take 17 g by mouth 2 (two) times daily. Patient not taking: Reported on 06/14/2021 12/31/20   Regalado, Jerald Kief A, MD  predniSONE (DELTASONE) 5 MG tablet Take 5 mg by mouth daily with breakfast.    [provider]    Allergies    Contrast media [iodinated diagnostic agents], Abilify [aripiprazole], Ultram [tramadol hcl], and Zithromax [azithromycin]  Review of Systems   Review of Systems  Constitutional:  Negative for chills and fever.  HENT:  Negative for ear pain and sore throat.   Eyes:  Negative for pain and visual disturbance.  Respiratory:  Negative for cough and shortness of breath.   Cardiovascular:  Negative for chest pain and palpitations.  Gastrointestinal:  Negative for abdominal pain and vomiting.  Genitourinary:  Negative for dysuria and hematuria.  Musculoskeletal:  Positive for back pain and neck pain. Negative for arthralgias.  Skin:  Negative for color change and rash.  Neurological:  Negative for seizures and syncope.  All other systems reviewed and are negative.  Physical Exam Updated Vital Signs BP (!) 135/110   Pulse 85   Temp 98.4 F (36.9 C)   Resp 18   SpO2 97%   Physical Exam Vitals and nursing note reviewed.  Constitutional:      General: She  is not in acute distress.    Appearance: She is well-developed.  HENT:     Head: Normocephalic and atraumatic.  Eyes:     Conjunctiva/sclera: Conjunctivae normal.  Cardiovascular:     Rate and Rhythm: Normal rate and regular rhythm.     Heart sounds: No murmur heard. Pulmonary:     Effort: Pulmonary effort is normal. No respiratory distress.     Breath sounds: Normal breath sounds.  Abdominal:     Palpations: Abdomen is soft.     Tenderness: There is no abdominal tenderness.  Musculoskeletal:        General: No swelling.     Cervical back: Neck supple.  Skin:    General: Skin is warm and dry.  Capillary Refill: Capillary refill takes less than 2 seconds.  Neurological:     General: No focal deficit present.     Mental Status: She is alert.  Psychiatric:        Mood and Affect: Mood normal.    ED Results / Procedures / Treatments   Labs (all labs ordered are listed, but only abnormal results are displayed) Labs Reviewed - No data to display  EKG EKG Interpretation  Date/Time:  Thursday June 20 2021 14:24:02 EST Ventricular Rate:  79 PR Interval:  129 QRS Duration: 81 QT Interval:  416 QTC Calculation: 477 R Axis:   6 Text Interpretation: Sinus rhythm Abnormal R-wave progression, early transition Confirmed by Madalyn Rob [phone]) on 06/20/2021 3:43:32 PM  Radiology DG Lumbar Spine Complete  Result Date: 06/20/2021 CLINICAL DATA:  Low back pain after fall. EXAM: LUMBAR SPINE - COMPLETE 4+ VIEW COMPARISON:  Lumbar spine x-ray 01/02/2015. MRI lumbar spine 06/14/2020. FINDINGS: L2-L5 posterior fusion hardware is again noted. Alignment is anatomic. There is no evidence for hardware loosening. There is mild compression deformity of the inferior endplate of L1 with 66% loss vertebral body height. This is unchanged. No acute fractures. Degenerative disc space narrowing and osteophyte formation have progressed at L1-L2, but are stable at L5-S1. IMPRESSION: 1. No acute  fracture or malalignment. 2. Stable posterior fusion changes L2-L5. Electronically Signed   By: Ronney Asters M.D.   On: 06/20/2021 15:31   DG Pelvis 1-2 Views  Result Date: 06/20/2021 CLINICAL DATA:  Pt c/o lower back and pelvic pain post fall today. Hx of prior back surgery. No hx of prior pelvic surgery or injuries. Best obtainable images.pelvis pain, low back pain aftr fall EXAM: PELVIS - 1-2 VIEW COMPARISON:  None. FINDINGS: Hips are located. No pelvic fracture or hip fracture. Posterior lumbar fusion. IMPRESSION: No acute osseous abnormality. Electronically Signed   By: Suzy Bouchard M.D.   On: 06/20/2021 15:25   CT Head Wo Contrast  Result Date: 06/20/2021 CLINICAL DATA:  She rolled out of bed and landed on top of her head, she is not on blood thinners, denies LOC, was looking at something on the side of the bed and rolled out approx 2 ft., EXAM: CT HEAD WITHOUT CONTRAST CT CERVICAL SPINE WITHOUT CONTRAST TECHNIQUE: Multidetector CT imaging of the head and cervical spine was performed following the standard protocol without intravenous contrast. Multiplanar CT image reconstructions of the cervical spine were also generated. COMPARISON:  MR cervical spine 06/14/2021, CT head 06/13/2021 FINDINGS: CT HEAD FINDINGS BRAIN: BRAIN Cerebral ventricle sizes are concordant with the degree of cerebral volume loss. Patchy and confluent areas of decreased attenuation are noted throughout the deep and periventricular white matter of the cerebral hemispheres bilaterally, compatible with chronic microvascular ischemic disease. No evidence of large-territorial acute infarction. No parenchymal hemorrhage. No mass lesion. No extra-axial collection. No mass effect or midline shift. No hydrocephalus. Basilar cisterns are patent. Vascular: No hyperdense vessel. Skull: No acute fracture or focal lesion. Sinuses/Orbits: Paranasal sinuses and mastoid air cells are clear. Left lens replacement. Otherwise orbits are  unremarkable. Other: None. CT CERVICAL SPINE FINDINGS Alignment: Normal. Skull base and vertebrae: Anterior and interbody fusion of the C3-C6 levels. Associated multilevel density of the spine most prominent at the C6-C7 C7-T1 levels. No acute fracture. No aggressive appearing focal osseous lesion or focal pathologic process. Soft tissues and spinal canal: No prevertebral fluid or swelling. No visible canal hematoma. Upper chest: Unremarkable. Other: None. IMPRESSION: 1. No acute intracranial  abnormality. 2. No acute displaced fracture or traumatic listhesis of the cervical spine in a patient with C3 through C6 anterior interbody fusion. Electronically Signed   By: Iven Finn M.D.   On: 06/20/2021 15:33   CT Cervical Spine Wo Contrast  Result Date: 06/20/2021 CLINICAL DATA:  She rolled out of bed and landed on top of her head, she is not on blood thinners, denies LOC, was looking at something on the side of the bed and rolled out approx 2 ft., EXAM: CT HEAD WITHOUT CONTRAST CT CERVICAL SPINE WITHOUT CONTRAST TECHNIQUE: Multidetector CT imaging of the head and cervical spine was performed following the standard protocol without intravenous contrast. Multiplanar CT image reconstructions of the cervical spine were also generated. COMPARISON:  MR cervical spine 06/14/2021, CT head 06/13/2021 FINDINGS: CT HEAD FINDINGS BRAIN: BRAIN Cerebral ventricle sizes are concordant with the degree of cerebral volume loss. Patchy and confluent areas of decreased attenuation are noted throughout the deep and periventricular white matter of the cerebral hemispheres bilaterally, compatible with chronic microvascular ischemic disease. No evidence of large-territorial acute infarction. No parenchymal hemorrhage. No mass lesion. No extra-axial collection. No mass effect or midline shift. No hydrocephalus. Basilar cisterns are patent. Vascular: No hyperdense vessel. Skull: No acute fracture or focal lesion. Sinuses/Orbits:  Paranasal sinuses and mastoid air cells are clear. Left lens replacement. Otherwise orbits are unremarkable. Other: None. CT CERVICAL SPINE FINDINGS Alignment: Normal. Skull base and vertebrae: Anterior and interbody fusion of the C3-C6 levels. Associated multilevel density of the spine most prominent at the C6-C7 C7-T1 levels. No acute fracture. No aggressive appearing focal osseous lesion or focal pathologic process. Soft tissues and spinal canal: No prevertebral fluid or swelling. No visible canal hematoma. Upper chest: Unremarkable. Other: None. IMPRESSION: 1. No acute intracranial abnormality. 2. No acute displaced fracture or traumatic listhesis of the cervical spine in a patient with C3 through C6 anterior interbody fusion. Electronically Signed   By: Iven Finn M.D.   On: 06/20/2021 15:33    Procedures Procedures   Medications Ordered in ED Medications - No data to display  ED Course  I have reviewed the triage vital signs and the nursing notes.  Pertinent labs & imaging results that were available during my care of the patient were reviewed by me and considered in my medical decision making (see chart for details).    MDM Rules/Calculators/A&P                           75 year old lady presents to the emergency room with concern for fall, head trauma.  On exam she appears well in no distress.  No visible signs of trauma to her head or face.  Head, C-spine imaging negative for acute traumatic pathology.  Plain films of her lumbar region were negative, screening pelvis x-ray negative.  Patient remained well-appearing throughout ER visit.  She denied LOC and denies ongoing complaints, discharged.   After the discussed management above, the patient was determined to be safe for discharge.  The patient was in agreement with this plan and all questions regarding their care were answered.  ED return precautions were discussed and the patient will return to the ED with any significant  worsening of condition.  Final Clinical Impression(s) / ED Diagnoses Final diagnoses:  Fall, initial encounter    Rx / DC Orders ED Discharge Orders     None        Sehaj Mcenroe, Ellwood Dense, MD  06/20/21 1608  

## 2021-06-20 NOTE — Discharge Instructions (Signed)
Follow-up with your primary care doctor, spine surgeon.  Come back to ER for any additional falls or other new concerning symptoms.

## 2021-06-20 NOTE — ED Notes (Signed)
Called [facility] x 2, was unable to speak with an RN/LPN or caregiver after waiting for 10 minutes each call. Left name, phone # and information to return call to [phone] for ED report, discharge and return information.

## 2021-06-20 NOTE — ED Notes (Signed)
ED Provider at bedside. 

## 2021-06-21 DIAGNOSIS — M4802 Spinal stenosis, cervical region: Secondary | ICD-10-CM | POA: Diagnosis not present

## 2021-06-21 DIAGNOSIS — M48061 Spinal stenosis, lumbar region without neurogenic claudication: Secondary | ICD-10-CM | POA: Diagnosis not present

## 2021-06-21 DIAGNOSIS — G959 Disease of spinal cord, unspecified: Secondary | ICD-10-CM | POA: Diagnosis not present

## 2021-06-21 DIAGNOSIS — R404 Transient alteration of awareness: Secondary | ICD-10-CM | POA: Diagnosis not present

## 2021-06-21 DIAGNOSIS — W19XXXA Unspecified fall, initial encounter: Secondary | ICD-10-CM | POA: Diagnosis not present

## 2021-06-21 DIAGNOSIS — R29898 Other symptoms and signs involving the musculoskeletal system: Secondary | ICD-10-CM | POA: Diagnosis not present

## 2021-06-21 DIAGNOSIS — I10 Essential (primary) hypertension: Secondary | ICD-10-CM | POA: Diagnosis not present

## 2021-06-21 DIAGNOSIS — Z7401 Bed confinement status: Secondary | ICD-10-CM | POA: Diagnosis not present

## 2021-06-21 DIAGNOSIS — E119 Type 2 diabetes mellitus without complications: Secondary | ICD-10-CM | POA: Diagnosis not present

## 2021-06-21 DIAGNOSIS — M5090 Cervical disc disorder, unspecified, unspecified cervical region: Secondary | ICD-10-CM | POA: Diagnosis not present

## 2021-06-25 DIAGNOSIS — G629 Polyneuropathy, unspecified: Secondary | ICD-10-CM | POA: Diagnosis not present

## 2021-06-25 DIAGNOSIS — F32A Depression, unspecified: Secondary | ICD-10-CM | POA: Diagnosis not present

## 2021-06-25 DIAGNOSIS — I10 Essential (primary) hypertension: Secondary | ICD-10-CM | POA: Diagnosis not present

## 2021-07-02 DIAGNOSIS — G3184 Mild cognitive impairment, so stated: Secondary | ICD-10-CM | POA: Diagnosis not present

## 2021-07-03 DIAGNOSIS — F32A Depression, unspecified: Secondary | ICD-10-CM | POA: Diagnosis not present

## 2021-07-03 DIAGNOSIS — M4802 Spinal stenosis, cervical region: Secondary | ICD-10-CM | POA: Diagnosis not present

## 2021-07-03 DIAGNOSIS — I10 Essential (primary) hypertension: Secondary | ICD-10-CM | POA: Diagnosis not present

## 2021-07-03 DIAGNOSIS — G629 Polyneuropathy, unspecified: Secondary | ICD-10-CM | POA: Diagnosis not present

## 2021-07-09 DIAGNOSIS — I10 Essential (primary) hypertension: Secondary | ICD-10-CM | POA: Diagnosis not present

## 2021-07-09 DIAGNOSIS — G629 Polyneuropathy, unspecified: Secondary | ICD-10-CM | POA: Diagnosis not present

## 2021-07-09 DIAGNOSIS — H04123 Dry eye syndrome of bilateral lacrimal glands: Secondary | ICD-10-CM | POA: Diagnosis not present

## 2021-07-09 DIAGNOSIS — F32A Depression, unspecified: Secondary | ICD-10-CM | POA: Diagnosis not present

## 2021-07-10 DIAGNOSIS — R7989 Other specified abnormal findings of blood chemistry: Secondary | ICD-10-CM | POA: Diagnosis not present

## 2021-07-11 DIAGNOSIS — I10 Essential (primary) hypertension: Secondary | ICD-10-CM | POA: Diagnosis not present

## 2021-07-11 DIAGNOSIS — G629 Polyneuropathy, unspecified: Secondary | ICD-10-CM | POA: Diagnosis not present

## 2021-07-11 DIAGNOSIS — E119 Type 2 diabetes mellitus without complications: Secondary | ICD-10-CM | POA: Diagnosis not present

## 2021-07-11 DIAGNOSIS — N1831 Chronic kidney disease, stage 3a: Secondary | ICD-10-CM | POA: Diagnosis not present

## 2021-07-22 DIAGNOSIS — N1831 Chronic kidney disease, stage 3a: Secondary | ICD-10-CM | POA: Diagnosis not present

## 2021-07-22 DIAGNOSIS — M48062 Spinal stenosis, lumbar region with neurogenic claudication: Secondary | ICD-10-CM | POA: Diagnosis not present

## 2021-07-22 DIAGNOSIS — M069 Rheumatoid arthritis, unspecified: Secondary | ICD-10-CM | POA: Diagnosis not present

## 2021-07-22 DIAGNOSIS — M6281 Muscle weakness (generalized): Secondary | ICD-10-CM | POA: Diagnosis not present

## 2021-07-22 DIAGNOSIS — R262 Difficulty in walking, not elsewhere classified: Secondary | ICD-10-CM | POA: Diagnosis not present

## 2021-07-22 DIAGNOSIS — R2681 Unsteadiness on feet: Secondary | ICD-10-CM | POA: Diagnosis not present

## 2021-07-22 DIAGNOSIS — R1312 Dysphagia, oropharyngeal phase: Secondary | ICD-10-CM | POA: Diagnosis not present

## 2021-07-22 DIAGNOSIS — G959 Disease of spinal cord, unspecified: Secondary | ICD-10-CM | POA: Diagnosis not present

## 2021-07-23 DIAGNOSIS — M4802 Spinal stenosis, cervical region: Secondary | ICD-10-CM | POA: Diagnosis not present

## 2021-07-23 DIAGNOSIS — G959 Disease of spinal cord, unspecified: Secondary | ICD-10-CM | POA: Diagnosis not present

## 2021-07-23 DIAGNOSIS — M069 Rheumatoid arthritis, unspecified: Secondary | ICD-10-CM | POA: Diagnosis not present

## 2021-07-23 DIAGNOSIS — I10 Essential (primary) hypertension: Secondary | ICD-10-CM | POA: Diagnosis not present

## 2021-07-23 DIAGNOSIS — R2681 Unsteadiness on feet: Secondary | ICD-10-CM | POA: Diagnosis not present

## 2021-07-23 DIAGNOSIS — E119 Type 2 diabetes mellitus without complications: Secondary | ICD-10-CM | POA: Diagnosis not present

## 2021-07-23 DIAGNOSIS — M6281 Muscle weakness (generalized): Secondary | ICD-10-CM | POA: Diagnosis not present

## 2021-07-23 DIAGNOSIS — M48062 Spinal stenosis, lumbar region with neurogenic claudication: Secondary | ICD-10-CM | POA: Diagnosis not present

## 2021-07-23 DIAGNOSIS — R1312 Dysphagia, oropharyngeal phase: Secondary | ICD-10-CM | POA: Diagnosis not present

## 2021-07-23 DIAGNOSIS — F32A Depression, unspecified: Secondary | ICD-10-CM | POA: Diagnosis not present

## 2021-07-23 DIAGNOSIS — R262 Difficulty in walking, not elsewhere classified: Secondary | ICD-10-CM | POA: Diagnosis not present

## 2021-07-23 DIAGNOSIS — N1831 Chronic kidney disease, stage 3a: Secondary | ICD-10-CM | POA: Diagnosis not present

## 2021-07-24 DIAGNOSIS — G959 Disease of spinal cord, unspecified: Secondary | ICD-10-CM | POA: Diagnosis not present

## 2021-07-24 DIAGNOSIS — R262 Difficulty in walking, not elsewhere classified: Secondary | ICD-10-CM | POA: Diagnosis not present

## 2021-07-24 DIAGNOSIS — N1831 Chronic kidney disease, stage 3a: Secondary | ICD-10-CM | POA: Diagnosis not present

## 2021-07-24 DIAGNOSIS — M6281 Muscle weakness (generalized): Secondary | ICD-10-CM | POA: Diagnosis not present

## 2021-07-24 DIAGNOSIS — R1312 Dysphagia, oropharyngeal phase: Secondary | ICD-10-CM | POA: Diagnosis not present

## 2021-07-24 DIAGNOSIS — R2681 Unsteadiness on feet: Secondary | ICD-10-CM | POA: Diagnosis not present

## 2021-07-24 DIAGNOSIS — M48062 Spinal stenosis, lumbar region with neurogenic claudication: Secondary | ICD-10-CM | POA: Diagnosis not present

## 2021-07-24 DIAGNOSIS — M069 Rheumatoid arthritis, unspecified: Secondary | ICD-10-CM | POA: Diagnosis not present

## 2021-07-26 DIAGNOSIS — G959 Disease of spinal cord, unspecified: Secondary | ICD-10-CM | POA: Diagnosis not present

## 2021-07-26 DIAGNOSIS — M48061 Spinal stenosis, lumbar region without neurogenic claudication: Secondary | ICD-10-CM | POA: Diagnosis not present

## 2021-07-26 DIAGNOSIS — M5090 Cervical disc disorder, unspecified, unspecified cervical region: Secondary | ICD-10-CM | POA: Diagnosis not present

## 2021-07-26 DIAGNOSIS — R29898 Other symptoms and signs involving the musculoskeletal system: Secondary | ICD-10-CM | POA: Diagnosis not present

## 2021-07-28 DIAGNOSIS — G629 Polyneuropathy, unspecified: Secondary | ICD-10-CM | POA: Diagnosis not present

## 2021-07-28 DIAGNOSIS — K5900 Constipation, unspecified: Secondary | ICD-10-CM | POA: Diagnosis not present

## 2021-07-28 DIAGNOSIS — F32A Depression, unspecified: Secondary | ICD-10-CM | POA: Diagnosis not present

## 2021-07-28 DIAGNOSIS — I10 Essential (primary) hypertension: Secondary | ICD-10-CM | POA: Diagnosis not present

## 2021-07-30 DIAGNOSIS — G3184 Mild cognitive impairment, so stated: Secondary | ICD-10-CM | POA: Diagnosis not present

## 2021-07-31 DIAGNOSIS — F32A Depression, unspecified: Secondary | ICD-10-CM | POA: Diagnosis not present

## 2021-07-31 DIAGNOSIS — I10 Essential (primary) hypertension: Secondary | ICD-10-CM | POA: Diagnosis not present

## 2021-07-31 DIAGNOSIS — G629 Polyneuropathy, unspecified: Secondary | ICD-10-CM | POA: Diagnosis not present

## 2021-07-31 DIAGNOSIS — Z7401 Bed confinement status: Secondary | ICD-10-CM | POA: Diagnosis not present

## 2021-07-31 DIAGNOSIS — M2550 Pain in unspecified joint: Secondary | ICD-10-CM | POA: Diagnosis not present

## 2021-07-31 DIAGNOSIS — M4802 Spinal stenosis, cervical region: Secondary | ICD-10-CM | POA: Diagnosis not present

## 2021-08-01 DIAGNOSIS — M069 Rheumatoid arthritis, unspecified: Secondary | ICD-10-CM | POA: Diagnosis not present

## 2021-08-01 DIAGNOSIS — G629 Polyneuropathy, unspecified: Secondary | ICD-10-CM | POA: Diagnosis not present

## 2021-08-01 DIAGNOSIS — K5900 Constipation, unspecified: Secondary | ICD-10-CM | POA: Diagnosis not present

## 2021-08-01 DIAGNOSIS — F32A Depression, unspecified: Secondary | ICD-10-CM | POA: Diagnosis not present

## 2021-08-01 DIAGNOSIS — M4802 Spinal stenosis, cervical region: Secondary | ICD-10-CM | POA: Diagnosis not present

## 2021-08-03 DIAGNOSIS — K5900 Constipation, unspecified: Secondary | ICD-10-CM | POA: Diagnosis not present

## 2021-08-03 DIAGNOSIS — I10 Essential (primary) hypertension: Secondary | ICD-10-CM | POA: Diagnosis not present

## 2021-08-03 DIAGNOSIS — M4802 Spinal stenosis, cervical region: Secondary | ICD-10-CM | POA: Diagnosis not present

## 2021-08-03 DIAGNOSIS — F32A Depression, unspecified: Secondary | ICD-10-CM | POA: Diagnosis not present

## 2021-08-03 DIAGNOSIS — G629 Polyneuropathy, unspecified: Secondary | ICD-10-CM | POA: Diagnosis not present

## 2021-08-03 DIAGNOSIS — M069 Rheumatoid arthritis, unspecified: Secondary | ICD-10-CM | POA: Diagnosis not present

## 2021-08-18 DIAGNOSIS — M069 Rheumatoid arthritis, unspecified: Secondary | ICD-10-CM | POA: Diagnosis not present

## 2021-08-18 DIAGNOSIS — F32A Depression, unspecified: Secondary | ICD-10-CM | POA: Diagnosis not present

## 2021-08-18 DIAGNOSIS — K5900 Constipation, unspecified: Secondary | ICD-10-CM | POA: Diagnosis not present

## 2021-08-18 DIAGNOSIS — H04123 Dry eye syndrome of bilateral lacrimal glands: Secondary | ICD-10-CM | POA: Diagnosis not present

## 2021-08-18 DIAGNOSIS — G629 Polyneuropathy, unspecified: Secondary | ICD-10-CM | POA: Diagnosis not present

## 2021-08-22 DIAGNOSIS — M6281 Muscle weakness (generalized): Secondary | ICD-10-CM | POA: Diagnosis not present

## 2021-08-22 DIAGNOSIS — N1831 Chronic kidney disease, stage 3a: Secondary | ICD-10-CM | POA: Diagnosis not present

## 2021-08-22 DIAGNOSIS — G959 Disease of spinal cord, unspecified: Secondary | ICD-10-CM | POA: Diagnosis not present

## 2021-08-22 DIAGNOSIS — R2681 Unsteadiness on feet: Secondary | ICD-10-CM | POA: Diagnosis not present

## 2021-08-22 DIAGNOSIS — R262 Difficulty in walking, not elsewhere classified: Secondary | ICD-10-CM | POA: Diagnosis not present

## 2021-08-22 DIAGNOSIS — M069 Rheumatoid arthritis, unspecified: Secondary | ICD-10-CM | POA: Diagnosis not present

## 2021-08-22 DIAGNOSIS — M48062 Spinal stenosis, lumbar region with neurogenic claudication: Secondary | ICD-10-CM | POA: Diagnosis not present

## 2021-08-22 DIAGNOSIS — R1312 Dysphagia, oropharyngeal phase: Secondary | ICD-10-CM | POA: Diagnosis not present

## 2021-08-23 DIAGNOSIS — M5090 Cervical disc disorder, unspecified, unspecified cervical region: Secondary | ICD-10-CM | POA: Diagnosis not present

## 2021-08-23 DIAGNOSIS — R29898 Other symptoms and signs involving the musculoskeletal system: Secondary | ICD-10-CM | POA: Diagnosis not present

## 2021-08-23 DIAGNOSIS — G959 Disease of spinal cord, unspecified: Secondary | ICD-10-CM | POA: Diagnosis not present

## 2021-08-23 DIAGNOSIS — M48061 Spinal stenosis, lumbar region without neurogenic claudication: Secondary | ICD-10-CM | POA: Diagnosis not present

## 2021-08-24 DIAGNOSIS — G629 Polyneuropathy, unspecified: Secondary | ICD-10-CM | POA: Diagnosis not present

## 2021-08-24 DIAGNOSIS — M069 Rheumatoid arthritis, unspecified: Secondary | ICD-10-CM | POA: Diagnosis not present

## 2021-08-24 DIAGNOSIS — I10 Essential (primary) hypertension: Secondary | ICD-10-CM | POA: Diagnosis not present

## 2021-08-24 DIAGNOSIS — K5900 Constipation, unspecified: Secondary | ICD-10-CM | POA: Diagnosis not present

## 2021-08-24 DIAGNOSIS — F32A Depression, unspecified: Secondary | ICD-10-CM | POA: Diagnosis not present

## 2021-08-25 DIAGNOSIS — G959 Disease of spinal cord, unspecified: Secondary | ICD-10-CM | POA: Diagnosis not present

## 2021-08-25 DIAGNOSIS — N1831 Chronic kidney disease, stage 3a: Secondary | ICD-10-CM | POA: Diagnosis not present

## 2021-08-25 DIAGNOSIS — M6281 Muscle weakness (generalized): Secondary | ICD-10-CM | POA: Diagnosis not present

## 2021-08-25 DIAGNOSIS — M48062 Spinal stenosis, lumbar region with neurogenic claudication: Secondary | ICD-10-CM | POA: Diagnosis not present

## 2021-08-25 DIAGNOSIS — M069 Rheumatoid arthritis, unspecified: Secondary | ICD-10-CM | POA: Diagnosis not present

## 2021-08-25 DIAGNOSIS — R1312 Dysphagia, oropharyngeal phase: Secondary | ICD-10-CM | POA: Diagnosis not present

## 2021-08-25 DIAGNOSIS — R262 Difficulty in walking, not elsewhere classified: Secondary | ICD-10-CM | POA: Diagnosis not present

## 2021-08-25 DIAGNOSIS — R2681 Unsteadiness on feet: Secondary | ICD-10-CM | POA: Diagnosis not present

## 2021-08-26 DIAGNOSIS — R2681 Unsteadiness on feet: Secondary | ICD-10-CM | POA: Diagnosis not present

## 2021-08-26 DIAGNOSIS — R1312 Dysphagia, oropharyngeal phase: Secondary | ICD-10-CM | POA: Diagnosis not present

## 2021-08-26 DIAGNOSIS — N1831 Chronic kidney disease, stage 3a: Secondary | ICD-10-CM | POA: Diagnosis not present

## 2021-08-26 DIAGNOSIS — R262 Difficulty in walking, not elsewhere classified: Secondary | ICD-10-CM | POA: Diagnosis not present

## 2021-08-26 DIAGNOSIS — M6281 Muscle weakness (generalized): Secondary | ICD-10-CM | POA: Diagnosis not present

## 2021-08-26 DIAGNOSIS — G959 Disease of spinal cord, unspecified: Secondary | ICD-10-CM | POA: Diagnosis not present

## 2021-08-26 DIAGNOSIS — M48062 Spinal stenosis, lumbar region with neurogenic claudication: Secondary | ICD-10-CM | POA: Diagnosis not present

## 2021-08-26 DIAGNOSIS — M069 Rheumatoid arthritis, unspecified: Secondary | ICD-10-CM | POA: Diagnosis not present

## 2021-08-27 ENCOUNTER — Other Ambulatory Visit: Payer: Medicare Other

## 2021-08-27 DIAGNOSIS — M4802 Spinal stenosis, cervical region: Secondary | ICD-10-CM | POA: Diagnosis not present

## 2021-08-27 DIAGNOSIS — M6281 Muscle weakness (generalized): Secondary | ICD-10-CM | POA: Diagnosis not present

## 2021-08-27 DIAGNOSIS — M069 Rheumatoid arthritis, unspecified: Secondary | ICD-10-CM | POA: Diagnosis not present

## 2021-08-27 DIAGNOSIS — I10 Essential (primary) hypertension: Secondary | ICD-10-CM | POA: Diagnosis not present

## 2021-08-27 DIAGNOSIS — M48062 Spinal stenosis, lumbar region with neurogenic claudication: Secondary | ICD-10-CM | POA: Diagnosis not present

## 2021-08-27 DIAGNOSIS — G959 Disease of spinal cord, unspecified: Secondary | ICD-10-CM | POA: Diagnosis not present

## 2021-08-27 DIAGNOSIS — F32A Depression, unspecified: Secondary | ICD-10-CM | POA: Diagnosis not present

## 2021-08-27 DIAGNOSIS — G3184 Mild cognitive impairment, so stated: Secondary | ICD-10-CM | POA: Diagnosis not present

## 2021-08-27 DIAGNOSIS — R2681 Unsteadiness on feet: Secondary | ICD-10-CM | POA: Diagnosis not present

## 2021-08-27 DIAGNOSIS — G629 Polyneuropathy, unspecified: Secondary | ICD-10-CM | POA: Diagnosis not present

## 2021-08-27 DIAGNOSIS — R262 Difficulty in walking, not elsewhere classified: Secondary | ICD-10-CM | POA: Diagnosis not present

## 2021-08-27 DIAGNOSIS — N1831 Chronic kidney disease, stage 3a: Secondary | ICD-10-CM | POA: Diagnosis not present

## 2021-08-27 DIAGNOSIS — R1312 Dysphagia, oropharyngeal phase: Secondary | ICD-10-CM | POA: Diagnosis not present

## 2021-08-28 DIAGNOSIS — R1312 Dysphagia, oropharyngeal phase: Secondary | ICD-10-CM | POA: Diagnosis not present

## 2021-08-28 DIAGNOSIS — M48062 Spinal stenosis, lumbar region with neurogenic claudication: Secondary | ICD-10-CM | POA: Diagnosis not present

## 2021-08-28 DIAGNOSIS — R262 Difficulty in walking, not elsewhere classified: Secondary | ICD-10-CM | POA: Diagnosis not present

## 2021-08-28 DIAGNOSIS — N1831 Chronic kidney disease, stage 3a: Secondary | ICD-10-CM | POA: Diagnosis not present

## 2021-08-28 DIAGNOSIS — M069 Rheumatoid arthritis, unspecified: Secondary | ICD-10-CM | POA: Diagnosis not present

## 2021-08-28 DIAGNOSIS — G959 Disease of spinal cord, unspecified: Secondary | ICD-10-CM | POA: Diagnosis not present

## 2021-08-28 DIAGNOSIS — R2681 Unsteadiness on feet: Secondary | ICD-10-CM | POA: Diagnosis not present

## 2021-08-28 DIAGNOSIS — M6281 Muscle weakness (generalized): Secondary | ICD-10-CM | POA: Diagnosis not present

## 2021-08-29 DIAGNOSIS — M069 Rheumatoid arthritis, unspecified: Secondary | ICD-10-CM | POA: Diagnosis not present

## 2021-08-29 DIAGNOSIS — M48062 Spinal stenosis, lumbar region with neurogenic claudication: Secondary | ICD-10-CM | POA: Diagnosis not present

## 2021-08-29 DIAGNOSIS — M6281 Muscle weakness (generalized): Secondary | ICD-10-CM | POA: Diagnosis not present

## 2021-08-29 DIAGNOSIS — R1312 Dysphagia, oropharyngeal phase: Secondary | ICD-10-CM | POA: Diagnosis not present

## 2021-08-29 DIAGNOSIS — N1831 Chronic kidney disease, stage 3a: Secondary | ICD-10-CM | POA: Diagnosis not present

## 2021-08-29 DIAGNOSIS — G959 Disease of spinal cord, unspecified: Secondary | ICD-10-CM | POA: Diagnosis not present

## 2021-08-29 DIAGNOSIS — R262 Difficulty in walking, not elsewhere classified: Secondary | ICD-10-CM | POA: Diagnosis not present

## 2021-08-29 DIAGNOSIS — R2681 Unsteadiness on feet: Secondary | ICD-10-CM | POA: Diagnosis not present

## 2021-08-30 DIAGNOSIS — R2681 Unsteadiness on feet: Secondary | ICD-10-CM | POA: Diagnosis not present

## 2021-08-30 DIAGNOSIS — M069 Rheumatoid arthritis, unspecified: Secondary | ICD-10-CM | POA: Diagnosis not present

## 2021-08-30 DIAGNOSIS — M48062 Spinal stenosis, lumbar region with neurogenic claudication: Secondary | ICD-10-CM | POA: Diagnosis not present

## 2021-08-30 DIAGNOSIS — R262 Difficulty in walking, not elsewhere classified: Secondary | ICD-10-CM | POA: Diagnosis not present

## 2021-08-30 DIAGNOSIS — N1831 Chronic kidney disease, stage 3a: Secondary | ICD-10-CM | POA: Diagnosis not present

## 2021-08-30 DIAGNOSIS — R1312 Dysphagia, oropharyngeal phase: Secondary | ICD-10-CM | POA: Diagnosis not present

## 2021-08-30 DIAGNOSIS — G959 Disease of spinal cord, unspecified: Secondary | ICD-10-CM | POA: Diagnosis not present

## 2021-08-30 DIAGNOSIS — M6281 Muscle weakness (generalized): Secondary | ICD-10-CM | POA: Diagnosis not present

## 2021-09-03 DIAGNOSIS — G959 Disease of spinal cord, unspecified: Secondary | ICD-10-CM | POA: Diagnosis not present

## 2021-09-03 DIAGNOSIS — R262 Difficulty in walking, not elsewhere classified: Secondary | ICD-10-CM | POA: Diagnosis not present

## 2021-09-03 DIAGNOSIS — N1831 Chronic kidney disease, stage 3a: Secondary | ICD-10-CM | POA: Diagnosis not present

## 2021-09-03 DIAGNOSIS — R1312 Dysphagia, oropharyngeal phase: Secondary | ICD-10-CM | POA: Diagnosis not present

## 2021-09-03 DIAGNOSIS — R2681 Unsteadiness on feet: Secondary | ICD-10-CM | POA: Diagnosis not present

## 2021-09-03 DIAGNOSIS — M6281 Muscle weakness (generalized): Secondary | ICD-10-CM | POA: Diagnosis not present

## 2021-09-03 DIAGNOSIS — M069 Rheumatoid arthritis, unspecified: Secondary | ICD-10-CM | POA: Diagnosis not present

## 2021-09-03 DIAGNOSIS — M48062 Spinal stenosis, lumbar region with neurogenic claudication: Secondary | ICD-10-CM | POA: Diagnosis not present

## 2021-09-04 DIAGNOSIS — E119 Type 2 diabetes mellitus without complications: Secondary | ICD-10-CM | POA: Diagnosis not present

## 2021-09-04 DIAGNOSIS — M4802 Spinal stenosis, cervical region: Secondary | ICD-10-CM | POA: Diagnosis not present

## 2021-09-04 DIAGNOSIS — G629 Polyneuropathy, unspecified: Secondary | ICD-10-CM | POA: Diagnosis not present

## 2021-09-04 DIAGNOSIS — F32A Depression, unspecified: Secondary | ICD-10-CM | POA: Diagnosis not present

## 2021-09-05 DIAGNOSIS — M069 Rheumatoid arthritis, unspecified: Secondary | ICD-10-CM | POA: Diagnosis not present

## 2021-09-05 DIAGNOSIS — R2681 Unsteadiness on feet: Secondary | ICD-10-CM | POA: Diagnosis not present

## 2021-09-05 DIAGNOSIS — R262 Difficulty in walking, not elsewhere classified: Secondary | ICD-10-CM | POA: Diagnosis not present

## 2021-09-05 DIAGNOSIS — M48062 Spinal stenosis, lumbar region with neurogenic claudication: Secondary | ICD-10-CM | POA: Diagnosis not present

## 2021-09-05 DIAGNOSIS — R1312 Dysphagia, oropharyngeal phase: Secondary | ICD-10-CM | POA: Diagnosis not present

## 2021-09-05 DIAGNOSIS — M6281 Muscle weakness (generalized): Secondary | ICD-10-CM | POA: Diagnosis not present

## 2021-09-05 DIAGNOSIS — N1831 Chronic kidney disease, stage 3a: Secondary | ICD-10-CM | POA: Diagnosis not present

## 2021-09-05 DIAGNOSIS — G959 Disease of spinal cord, unspecified: Secondary | ICD-10-CM | POA: Diagnosis not present

## 2021-09-06 DIAGNOSIS — G959 Disease of spinal cord, unspecified: Secondary | ICD-10-CM | POA: Diagnosis not present

## 2021-09-06 DIAGNOSIS — M48062 Spinal stenosis, lumbar region with neurogenic claudication: Secondary | ICD-10-CM | POA: Diagnosis not present

## 2021-09-06 DIAGNOSIS — R1312 Dysphagia, oropharyngeal phase: Secondary | ICD-10-CM | POA: Diagnosis not present

## 2021-09-06 DIAGNOSIS — R2681 Unsteadiness on feet: Secondary | ICD-10-CM | POA: Diagnosis not present

## 2021-09-06 DIAGNOSIS — M069 Rheumatoid arthritis, unspecified: Secondary | ICD-10-CM | POA: Diagnosis not present

## 2021-09-06 DIAGNOSIS — N1831 Chronic kidney disease, stage 3a: Secondary | ICD-10-CM | POA: Diagnosis not present

## 2021-09-06 DIAGNOSIS — R262 Difficulty in walking, not elsewhere classified: Secondary | ICD-10-CM | POA: Diagnosis not present

## 2021-09-06 DIAGNOSIS — M6281 Muscle weakness (generalized): Secondary | ICD-10-CM | POA: Diagnosis not present

## 2021-09-09 DIAGNOSIS — G959 Disease of spinal cord, unspecified: Secondary | ICD-10-CM | POA: Diagnosis not present

## 2021-09-09 DIAGNOSIS — G629 Polyneuropathy, unspecified: Secondary | ICD-10-CM | POA: Diagnosis not present

## 2021-09-09 DIAGNOSIS — M48062 Spinal stenosis, lumbar region with neurogenic claudication: Secondary | ICD-10-CM | POA: Diagnosis not present

## 2021-09-09 DIAGNOSIS — E119 Type 2 diabetes mellitus without complications: Secondary | ICD-10-CM | POA: Diagnosis not present

## 2021-09-09 DIAGNOSIS — R262 Difficulty in walking, not elsewhere classified: Secondary | ICD-10-CM | POA: Diagnosis not present

## 2021-09-09 DIAGNOSIS — R2681 Unsteadiness on feet: Secondary | ICD-10-CM | POA: Diagnosis not present

## 2021-09-09 DIAGNOSIS — M069 Rheumatoid arthritis, unspecified: Secondary | ICD-10-CM | POA: Diagnosis not present

## 2021-09-09 DIAGNOSIS — M4802 Spinal stenosis, cervical region: Secondary | ICD-10-CM | POA: Diagnosis not present

## 2021-09-09 DIAGNOSIS — N1831 Chronic kidney disease, stage 3a: Secondary | ICD-10-CM | POA: Diagnosis not present

## 2021-09-09 DIAGNOSIS — R1312 Dysphagia, oropharyngeal phase: Secondary | ICD-10-CM | POA: Diagnosis not present

## 2021-09-09 DIAGNOSIS — F32A Depression, unspecified: Secondary | ICD-10-CM | POA: Diagnosis not present

## 2021-09-09 DIAGNOSIS — M6281 Muscle weakness (generalized): Secondary | ICD-10-CM | POA: Diagnosis not present

## 2021-09-10 DIAGNOSIS — R262 Difficulty in walking, not elsewhere classified: Secondary | ICD-10-CM | POA: Diagnosis not present

## 2021-09-10 DIAGNOSIS — M069 Rheumatoid arthritis, unspecified: Secondary | ICD-10-CM | POA: Diagnosis not present

## 2021-09-10 DIAGNOSIS — R2681 Unsteadiness on feet: Secondary | ICD-10-CM | POA: Diagnosis not present

## 2021-09-10 DIAGNOSIS — N1831 Chronic kidney disease, stage 3a: Secondary | ICD-10-CM | POA: Diagnosis not present

## 2021-09-10 DIAGNOSIS — M6281 Muscle weakness (generalized): Secondary | ICD-10-CM | POA: Diagnosis not present

## 2021-09-10 DIAGNOSIS — G959 Disease of spinal cord, unspecified: Secondary | ICD-10-CM | POA: Diagnosis not present

## 2021-09-10 DIAGNOSIS — R1312 Dysphagia, oropharyngeal phase: Secondary | ICD-10-CM | POA: Diagnosis not present

## 2021-09-10 DIAGNOSIS — M48062 Spinal stenosis, lumbar region with neurogenic claudication: Secondary | ICD-10-CM | POA: Diagnosis not present

## 2021-09-11 DIAGNOSIS — M48062 Spinal stenosis, lumbar region with neurogenic claudication: Secondary | ICD-10-CM | POA: Diagnosis not present

## 2021-09-11 DIAGNOSIS — N1831 Chronic kidney disease, stage 3a: Secondary | ICD-10-CM | POA: Diagnosis not present

## 2021-09-11 DIAGNOSIS — M069 Rheumatoid arthritis, unspecified: Secondary | ICD-10-CM | POA: Diagnosis not present

## 2021-09-11 DIAGNOSIS — G959 Disease of spinal cord, unspecified: Secondary | ICD-10-CM | POA: Diagnosis not present

## 2021-09-12 DIAGNOSIS — G959 Disease of spinal cord, unspecified: Secondary | ICD-10-CM | POA: Diagnosis not present

## 2021-09-12 DIAGNOSIS — R8279 Other abnormal findings on microbiological examination of urine: Secondary | ICD-10-CM | POA: Diagnosis not present

## 2021-09-12 DIAGNOSIS — M48062 Spinal stenosis, lumbar region with neurogenic claudication: Secondary | ICD-10-CM | POA: Diagnosis not present

## 2021-09-12 DIAGNOSIS — R310 Gross hematuria: Secondary | ICD-10-CM | POA: Diagnosis not present

## 2021-09-12 DIAGNOSIS — M069 Rheumatoid arthritis, unspecified: Secondary | ICD-10-CM | POA: Diagnosis not present

## 2021-09-12 DIAGNOSIS — N1831 Chronic kidney disease, stage 3a: Secondary | ICD-10-CM | POA: Diagnosis not present

## 2021-09-12 DIAGNOSIS — C652 Malignant neoplasm of left renal pelvis: Secondary | ICD-10-CM | POA: Diagnosis not present

## 2021-09-13 DIAGNOSIS — G959 Disease of spinal cord, unspecified: Secondary | ICD-10-CM | POA: Diagnosis not present

## 2021-09-13 DIAGNOSIS — M48062 Spinal stenosis, lumbar region with neurogenic claudication: Secondary | ICD-10-CM | POA: Diagnosis not present

## 2021-09-13 DIAGNOSIS — N1831 Chronic kidney disease, stage 3a: Secondary | ICD-10-CM | POA: Diagnosis not present

## 2021-09-13 DIAGNOSIS — M069 Rheumatoid arthritis, unspecified: Secondary | ICD-10-CM | POA: Diagnosis not present

## 2021-09-14 DIAGNOSIS — G629 Polyneuropathy, unspecified: Secondary | ICD-10-CM | POA: Diagnosis not present

## 2021-09-14 DIAGNOSIS — M069 Rheumatoid arthritis, unspecified: Secondary | ICD-10-CM | POA: Diagnosis not present

## 2021-09-14 DIAGNOSIS — F32A Depression, unspecified: Secondary | ICD-10-CM | POA: Diagnosis not present

## 2021-09-14 DIAGNOSIS — I10 Essential (primary) hypertension: Secondary | ICD-10-CM | POA: Diagnosis not present

## 2021-09-16 DIAGNOSIS — F32A Depression, unspecified: Secondary | ICD-10-CM | POA: Diagnosis not present

## 2021-09-16 DIAGNOSIS — G629 Polyneuropathy, unspecified: Secondary | ICD-10-CM | POA: Diagnosis not present

## 2021-09-16 DIAGNOSIS — M4802 Spinal stenosis, cervical region: Secondary | ICD-10-CM | POA: Diagnosis not present

## 2021-09-16 DIAGNOSIS — E119 Type 2 diabetes mellitus without complications: Secondary | ICD-10-CM | POA: Diagnosis not present

## 2021-09-18 DIAGNOSIS — E1122 Type 2 diabetes mellitus with diabetic chronic kidney disease: Secondary | ICD-10-CM | POA: Diagnosis not present

## 2021-09-18 DIAGNOSIS — M48061 Spinal stenosis, lumbar region without neurogenic claudication: Secondary | ICD-10-CM | POA: Diagnosis not present

## 2021-09-18 DIAGNOSIS — I10 Essential (primary) hypertension: Secondary | ICD-10-CM | POA: Diagnosis not present

## 2021-09-18 DIAGNOSIS — N1831 Chronic kidney disease, stage 3a: Secondary | ICD-10-CM | POA: Diagnosis not present

## 2021-09-18 DIAGNOSIS — Z79899 Other long term (current) drug therapy: Secondary | ICD-10-CM | POA: Diagnosis not present

## 2021-09-18 DIAGNOSIS — G3184 Mild cognitive impairment, so stated: Secondary | ICD-10-CM | POA: Diagnosis not present

## 2021-10-03 DIAGNOSIS — G959 Disease of spinal cord, unspecified: Secondary | ICD-10-CM | POA: Diagnosis not present

## 2021-10-03 DIAGNOSIS — M5126 Other intervertebral disc displacement, lumbar region: Secondary | ICD-10-CM | POA: Diagnosis not present

## 2021-10-16 DIAGNOSIS — N1831 Chronic kidney disease, stage 3a: Secondary | ICD-10-CM | POA: Diagnosis not present

## 2021-10-16 DIAGNOSIS — M0510 Rheumatoid lung disease with rheumatoid arthritis of unspecified site: Secondary | ICD-10-CM | POA: Diagnosis not present

## 2021-10-16 DIAGNOSIS — R197 Diarrhea, unspecified: Secondary | ICD-10-CM | POA: Diagnosis not present

## 2021-10-16 DIAGNOSIS — I10 Essential (primary) hypertension: Secondary | ICD-10-CM | POA: Diagnosis not present

## 2021-10-28 DIAGNOSIS — E1169 Type 2 diabetes mellitus with other specified complication: Secondary | ICD-10-CM | POA: Diagnosis not present

## 2021-10-28 DIAGNOSIS — G8252 Quadriplegia, C1-C4 incomplete: Secondary | ICD-10-CM | POA: Diagnosis not present

## 2021-10-28 DIAGNOSIS — Z981 Arthrodesis status: Secondary | ICD-10-CM | POA: Diagnosis not present

## 2021-10-28 DIAGNOSIS — Z7982 Long term (current) use of aspirin: Secondary | ICD-10-CM | POA: Diagnosis not present

## 2021-10-28 DIAGNOSIS — M5001 Cervical disc disorder with myelopathy,  high cervical region: Secondary | ICD-10-CM | POA: Diagnosis not present

## 2021-10-28 DIAGNOSIS — R1310 Dysphagia, unspecified: Secondary | ICD-10-CM | POA: Diagnosis not present

## 2021-10-28 DIAGNOSIS — E039 Hypothyroidism, unspecified: Secondary | ICD-10-CM | POA: Diagnosis not present

## 2021-10-28 DIAGNOSIS — Z7952 Long term (current) use of systemic steroids: Secondary | ICD-10-CM | POA: Diagnosis not present

## 2021-10-28 DIAGNOSIS — E7800 Pure hypercholesterolemia, unspecified: Secondary | ICD-10-CM | POA: Diagnosis not present

## 2021-10-28 DIAGNOSIS — M5136 Other intervertebral disc degeneration, lumbar region: Secondary | ICD-10-CM | POA: Diagnosis not present

## 2021-10-28 DIAGNOSIS — M48061 Spinal stenosis, lumbar region without neurogenic claudication: Secondary | ICD-10-CM | POA: Diagnosis not present

## 2021-10-28 DIAGNOSIS — I129 Hypertensive chronic kidney disease with stage 1 through stage 4 chronic kidney disease, or unspecified chronic kidney disease: Secondary | ICD-10-CM | POA: Diagnosis not present

## 2021-10-28 DIAGNOSIS — M069 Rheumatoid arthritis, unspecified: Secondary | ICD-10-CM | POA: Diagnosis not present

## 2021-10-28 DIAGNOSIS — E1122 Type 2 diabetes mellitus with diabetic chronic kidney disease: Secondary | ICD-10-CM | POA: Diagnosis not present

## 2021-10-28 DIAGNOSIS — N1831 Chronic kidney disease, stage 3a: Secondary | ICD-10-CM | POA: Diagnosis not present

## 2021-10-30 DIAGNOSIS — M0510 Rheumatoid lung disease with rheumatoid arthritis of unspecified site: Secondary | ICD-10-CM | POA: Diagnosis not present

## 2021-10-30 DIAGNOSIS — N1831 Chronic kidney disease, stage 3a: Secondary | ICD-10-CM | POA: Diagnosis not present

## 2021-10-30 DIAGNOSIS — I129 Hypertensive chronic kidney disease with stage 1 through stage 4 chronic kidney disease, or unspecified chronic kidney disease: Secondary | ICD-10-CM | POA: Diagnosis not present

## 2021-10-31 DIAGNOSIS — M5416 Radiculopathy, lumbar region: Secondary | ICD-10-CM | POA: Diagnosis not present

## 2021-11-04 DIAGNOSIS — M48061 Spinal stenosis, lumbar region without neurogenic claudication: Secondary | ICD-10-CM | POA: Diagnosis not present

## 2021-11-04 DIAGNOSIS — I129 Hypertensive chronic kidney disease with stage 1 through stage 4 chronic kidney disease, or unspecified chronic kidney disease: Secondary | ICD-10-CM | POA: Diagnosis not present

## 2021-11-04 DIAGNOSIS — E1169 Type 2 diabetes mellitus with other specified complication: Secondary | ICD-10-CM | POA: Diagnosis not present

## 2021-11-04 DIAGNOSIS — M069 Rheumatoid arthritis, unspecified: Secondary | ICD-10-CM | POA: Diagnosis not present

## 2021-11-04 DIAGNOSIS — E039 Hypothyroidism, unspecified: Secondary | ICD-10-CM | POA: Diagnosis not present

## 2021-11-04 DIAGNOSIS — R1310 Dysphagia, unspecified: Secondary | ICD-10-CM | POA: Diagnosis not present

## 2021-11-04 DIAGNOSIS — M5001 Cervical disc disorder with myelopathy,  high cervical region: Secondary | ICD-10-CM | POA: Diagnosis not present

## 2021-11-04 DIAGNOSIS — Z981 Arthrodesis status: Secondary | ICD-10-CM | POA: Diagnosis not present

## 2021-11-04 DIAGNOSIS — E1122 Type 2 diabetes mellitus with diabetic chronic kidney disease: Secondary | ICD-10-CM | POA: Diagnosis not present

## 2021-11-04 DIAGNOSIS — E7800 Pure hypercholesterolemia, unspecified: Secondary | ICD-10-CM | POA: Diagnosis not present

## 2021-11-04 DIAGNOSIS — Z7952 Long term (current) use of systemic steroids: Secondary | ICD-10-CM | POA: Diagnosis not present

## 2021-11-04 DIAGNOSIS — M5136 Other intervertebral disc degeneration, lumbar region: Secondary | ICD-10-CM | POA: Diagnosis not present

## 2021-11-04 DIAGNOSIS — N1831 Chronic kidney disease, stage 3a: Secondary | ICD-10-CM | POA: Diagnosis not present

## 2021-11-04 DIAGNOSIS — Z7982 Long term (current) use of aspirin: Secondary | ICD-10-CM | POA: Diagnosis not present

## 2021-11-04 DIAGNOSIS — G8252 Quadriplegia, C1-C4 incomplete: Secondary | ICD-10-CM | POA: Diagnosis not present

## 2021-11-05 DIAGNOSIS — Z981 Arthrodesis status: Secondary | ICD-10-CM | POA: Diagnosis not present

## 2021-11-05 DIAGNOSIS — M48061 Spinal stenosis, lumbar region without neurogenic claudication: Secondary | ICD-10-CM | POA: Diagnosis not present

## 2021-11-05 DIAGNOSIS — Z7982 Long term (current) use of aspirin: Secondary | ICD-10-CM | POA: Diagnosis not present

## 2021-11-05 DIAGNOSIS — E039 Hypothyroidism, unspecified: Secondary | ICD-10-CM | POA: Diagnosis not present

## 2021-11-05 DIAGNOSIS — E1169 Type 2 diabetes mellitus with other specified complication: Secondary | ICD-10-CM | POA: Diagnosis not present

## 2021-11-05 DIAGNOSIS — M5136 Other intervertebral disc degeneration, lumbar region: Secondary | ICD-10-CM | POA: Diagnosis not present

## 2021-11-05 DIAGNOSIS — N1831 Chronic kidney disease, stage 3a: Secondary | ICD-10-CM | POA: Diagnosis not present

## 2021-11-05 DIAGNOSIS — E1122 Type 2 diabetes mellitus with diabetic chronic kidney disease: Secondary | ICD-10-CM | POA: Diagnosis not present

## 2021-11-05 DIAGNOSIS — E7800 Pure hypercholesterolemia, unspecified: Secondary | ICD-10-CM | POA: Diagnosis not present

## 2021-11-05 DIAGNOSIS — G8252 Quadriplegia, C1-C4 incomplete: Secondary | ICD-10-CM | POA: Diagnosis not present

## 2021-11-05 DIAGNOSIS — M069 Rheumatoid arthritis, unspecified: Secondary | ICD-10-CM | POA: Diagnosis not present

## 2021-11-05 DIAGNOSIS — Z7952 Long term (current) use of systemic steroids: Secondary | ICD-10-CM | POA: Diagnosis not present

## 2021-11-05 DIAGNOSIS — M5001 Cervical disc disorder with myelopathy,  high cervical region: Secondary | ICD-10-CM | POA: Diagnosis not present

## 2021-11-05 DIAGNOSIS — I129 Hypertensive chronic kidney disease with stage 1 through stage 4 chronic kidney disease, or unspecified chronic kidney disease: Secondary | ICD-10-CM | POA: Diagnosis not present

## 2021-11-05 DIAGNOSIS — R1310 Dysphagia, unspecified: Secondary | ICD-10-CM | POA: Diagnosis not present

## 2021-11-08 DIAGNOSIS — E039 Hypothyroidism, unspecified: Secondary | ICD-10-CM | POA: Diagnosis not present

## 2021-11-08 DIAGNOSIS — M48061 Spinal stenosis, lumbar region without neurogenic claudication: Secondary | ICD-10-CM | POA: Diagnosis not present

## 2021-11-08 DIAGNOSIS — Z981 Arthrodesis status: Secondary | ICD-10-CM | POA: Diagnosis not present

## 2021-11-08 DIAGNOSIS — Z7982 Long term (current) use of aspirin: Secondary | ICD-10-CM | POA: Diagnosis not present

## 2021-11-08 DIAGNOSIS — G8252 Quadriplegia, C1-C4 incomplete: Secondary | ICD-10-CM | POA: Diagnosis not present

## 2021-11-08 DIAGNOSIS — Z7952 Long term (current) use of systemic steroids: Secondary | ICD-10-CM | POA: Diagnosis not present

## 2021-11-08 DIAGNOSIS — M5001 Cervical disc disorder with myelopathy,  high cervical region: Secondary | ICD-10-CM | POA: Diagnosis not present

## 2021-11-08 DIAGNOSIS — M069 Rheumatoid arthritis, unspecified: Secondary | ICD-10-CM | POA: Diagnosis not present

## 2021-11-08 DIAGNOSIS — E1122 Type 2 diabetes mellitus with diabetic chronic kidney disease: Secondary | ICD-10-CM | POA: Diagnosis not present

## 2021-11-08 DIAGNOSIS — E1169 Type 2 diabetes mellitus with other specified complication: Secondary | ICD-10-CM | POA: Diagnosis not present

## 2021-11-08 DIAGNOSIS — M5136 Other intervertebral disc degeneration, lumbar region: Secondary | ICD-10-CM | POA: Diagnosis not present

## 2021-11-08 DIAGNOSIS — N1831 Chronic kidney disease, stage 3a: Secondary | ICD-10-CM | POA: Diagnosis not present

## 2021-11-08 DIAGNOSIS — E7800 Pure hypercholesterolemia, unspecified: Secondary | ICD-10-CM | POA: Diagnosis not present

## 2021-11-08 DIAGNOSIS — R1310 Dysphagia, unspecified: Secondary | ICD-10-CM | POA: Diagnosis not present

## 2021-11-08 DIAGNOSIS — I129 Hypertensive chronic kidney disease with stage 1 through stage 4 chronic kidney disease, or unspecified chronic kidney disease: Secondary | ICD-10-CM | POA: Diagnosis not present

## 2021-11-12 DIAGNOSIS — M5136 Other intervertebral disc degeneration, lumbar region: Secondary | ICD-10-CM | POA: Diagnosis not present

## 2021-11-12 DIAGNOSIS — I129 Hypertensive chronic kidney disease with stage 1 through stage 4 chronic kidney disease, or unspecified chronic kidney disease: Secondary | ICD-10-CM | POA: Diagnosis not present

## 2021-11-12 DIAGNOSIS — E1122 Type 2 diabetes mellitus with diabetic chronic kidney disease: Secondary | ICD-10-CM | POA: Diagnosis not present

## 2021-11-12 DIAGNOSIS — E039 Hypothyroidism, unspecified: Secondary | ICD-10-CM | POA: Diagnosis not present

## 2021-11-12 DIAGNOSIS — Z7952 Long term (current) use of systemic steroids: Secondary | ICD-10-CM | POA: Diagnosis not present

## 2021-11-12 DIAGNOSIS — G8252 Quadriplegia, C1-C4 incomplete: Secondary | ICD-10-CM | POA: Diagnosis not present

## 2021-11-12 DIAGNOSIS — N1831 Chronic kidney disease, stage 3a: Secondary | ICD-10-CM | POA: Diagnosis not present

## 2021-11-12 DIAGNOSIS — E7800 Pure hypercholesterolemia, unspecified: Secondary | ICD-10-CM | POA: Diagnosis not present

## 2021-11-12 DIAGNOSIS — E1169 Type 2 diabetes mellitus with other specified complication: Secondary | ICD-10-CM | POA: Diagnosis not present

## 2021-11-12 DIAGNOSIS — M5001 Cervical disc disorder with myelopathy,  high cervical region: Secondary | ICD-10-CM | POA: Diagnosis not present

## 2021-11-12 DIAGNOSIS — R1310 Dysphagia, unspecified: Secondary | ICD-10-CM | POA: Diagnosis not present

## 2021-11-12 DIAGNOSIS — M48061 Spinal stenosis, lumbar region without neurogenic claudication: Secondary | ICD-10-CM | POA: Diagnosis not present

## 2021-11-12 DIAGNOSIS — M069 Rheumatoid arthritis, unspecified: Secondary | ICD-10-CM | POA: Diagnosis not present

## 2021-11-12 DIAGNOSIS — Z7982 Long term (current) use of aspirin: Secondary | ICD-10-CM | POA: Diagnosis not present

## 2021-11-12 DIAGNOSIS — Z981 Arthrodesis status: Secondary | ICD-10-CM | POA: Diagnosis not present

## 2021-11-13 DIAGNOSIS — I10 Essential (primary) hypertension: Secondary | ICD-10-CM | POA: Diagnosis not present

## 2021-11-13 DIAGNOSIS — M0510 Rheumatoid lung disease with rheumatoid arthritis of unspecified site: Secondary | ICD-10-CM | POA: Diagnosis not present

## 2021-11-14 DIAGNOSIS — Z7982 Long term (current) use of aspirin: Secondary | ICD-10-CM | POA: Diagnosis not present

## 2021-11-14 DIAGNOSIS — Z7952 Long term (current) use of systemic steroids: Secondary | ICD-10-CM | POA: Diagnosis not present

## 2021-11-14 DIAGNOSIS — M5001 Cervical disc disorder with myelopathy,  high cervical region: Secondary | ICD-10-CM | POA: Diagnosis not present

## 2021-11-14 DIAGNOSIS — M48061 Spinal stenosis, lumbar region without neurogenic claudication: Secondary | ICD-10-CM | POA: Diagnosis not present

## 2021-11-14 DIAGNOSIS — N1831 Chronic kidney disease, stage 3a: Secondary | ICD-10-CM | POA: Diagnosis not present

## 2021-11-14 DIAGNOSIS — E039 Hypothyroidism, unspecified: Secondary | ICD-10-CM | POA: Diagnosis not present

## 2021-11-14 DIAGNOSIS — I129 Hypertensive chronic kidney disease with stage 1 through stage 4 chronic kidney disease, or unspecified chronic kidney disease: Secondary | ICD-10-CM | POA: Diagnosis not present

## 2021-11-14 DIAGNOSIS — G8252 Quadriplegia, C1-C4 incomplete: Secondary | ICD-10-CM | POA: Diagnosis not present

## 2021-11-14 DIAGNOSIS — M5136 Other intervertebral disc degeneration, lumbar region: Secondary | ICD-10-CM | POA: Diagnosis not present

## 2021-11-14 DIAGNOSIS — R1310 Dysphagia, unspecified: Secondary | ICD-10-CM | POA: Diagnosis not present

## 2021-11-14 DIAGNOSIS — E1122 Type 2 diabetes mellitus with diabetic chronic kidney disease: Secondary | ICD-10-CM | POA: Diagnosis not present

## 2021-11-14 DIAGNOSIS — Z981 Arthrodesis status: Secondary | ICD-10-CM | POA: Diagnosis not present

## 2021-11-14 DIAGNOSIS — E7800 Pure hypercholesterolemia, unspecified: Secondary | ICD-10-CM | POA: Diagnosis not present

## 2021-11-14 DIAGNOSIS — E1169 Type 2 diabetes mellitus with other specified complication: Secondary | ICD-10-CM | POA: Diagnosis not present

## 2021-11-14 DIAGNOSIS — M069 Rheumatoid arthritis, unspecified: Secondary | ICD-10-CM | POA: Diagnosis not present

## 2021-11-15 DIAGNOSIS — Z981 Arthrodesis status: Secondary | ICD-10-CM | POA: Diagnosis not present

## 2021-11-15 DIAGNOSIS — M5136 Other intervertebral disc degeneration, lumbar region: Secondary | ICD-10-CM | POA: Diagnosis not present

## 2021-11-15 DIAGNOSIS — I129 Hypertensive chronic kidney disease with stage 1 through stage 4 chronic kidney disease, or unspecified chronic kidney disease: Secondary | ICD-10-CM | POA: Diagnosis not present

## 2021-11-15 DIAGNOSIS — M48061 Spinal stenosis, lumbar region without neurogenic claudication: Secondary | ICD-10-CM | POA: Diagnosis not present

## 2021-11-15 DIAGNOSIS — E7800 Pure hypercholesterolemia, unspecified: Secondary | ICD-10-CM | POA: Diagnosis not present

## 2021-11-15 DIAGNOSIS — N1831 Chronic kidney disease, stage 3a: Secondary | ICD-10-CM | POA: Diagnosis not present

## 2021-11-15 DIAGNOSIS — Z7982 Long term (current) use of aspirin: Secondary | ICD-10-CM | POA: Diagnosis not present

## 2021-11-15 DIAGNOSIS — E1122 Type 2 diabetes mellitus with diabetic chronic kidney disease: Secondary | ICD-10-CM | POA: Diagnosis not present

## 2021-11-15 DIAGNOSIS — E039 Hypothyroidism, unspecified: Secondary | ICD-10-CM | POA: Diagnosis not present

## 2021-11-15 DIAGNOSIS — M5001 Cervical disc disorder with myelopathy,  high cervical region: Secondary | ICD-10-CM | POA: Diagnosis not present

## 2021-11-15 DIAGNOSIS — G8252 Quadriplegia, C1-C4 incomplete: Secondary | ICD-10-CM | POA: Diagnosis not present

## 2021-11-15 DIAGNOSIS — E1169 Type 2 diabetes mellitus with other specified complication: Secondary | ICD-10-CM | POA: Diagnosis not present

## 2021-11-15 DIAGNOSIS — Z7952 Long term (current) use of systemic steroids: Secondary | ICD-10-CM | POA: Diagnosis not present

## 2021-11-15 DIAGNOSIS — R1310 Dysphagia, unspecified: Secondary | ICD-10-CM | POA: Diagnosis not present

## 2021-11-15 DIAGNOSIS — M069 Rheumatoid arthritis, unspecified: Secondary | ICD-10-CM | POA: Diagnosis not present

## 2021-11-18 DIAGNOSIS — S41009A Unspecified open wound of unspecified shoulder, initial encounter: Secondary | ICD-10-CM | POA: Diagnosis not present

## 2021-11-19 DIAGNOSIS — Z981 Arthrodesis status: Secondary | ICD-10-CM | POA: Diagnosis not present

## 2021-11-19 DIAGNOSIS — E7800 Pure hypercholesterolemia, unspecified: Secondary | ICD-10-CM | POA: Diagnosis not present

## 2021-11-19 DIAGNOSIS — R197 Diarrhea, unspecified: Secondary | ICD-10-CM | POA: Diagnosis not present

## 2021-11-19 DIAGNOSIS — M48061 Spinal stenosis, lumbar region without neurogenic claudication: Secondary | ICD-10-CM | POA: Diagnosis not present

## 2021-11-19 DIAGNOSIS — R1310 Dysphagia, unspecified: Secondary | ICD-10-CM | POA: Diagnosis not present

## 2021-11-19 DIAGNOSIS — N1831 Chronic kidney disease, stage 3a: Secondary | ICD-10-CM | POA: Diagnosis not present

## 2021-11-19 DIAGNOSIS — Z7952 Long term (current) use of systemic steroids: Secondary | ICD-10-CM | POA: Diagnosis not present

## 2021-11-19 DIAGNOSIS — E1122 Type 2 diabetes mellitus with diabetic chronic kidney disease: Secondary | ICD-10-CM | POA: Diagnosis not present

## 2021-11-19 DIAGNOSIS — Z7982 Long term (current) use of aspirin: Secondary | ICD-10-CM | POA: Diagnosis not present

## 2021-11-19 DIAGNOSIS — M5136 Other intervertebral disc degeneration, lumbar region: Secondary | ICD-10-CM | POA: Diagnosis not present

## 2021-11-19 DIAGNOSIS — R609 Edema, unspecified: Secondary | ICD-10-CM | POA: Diagnosis not present

## 2021-11-19 DIAGNOSIS — G8252 Quadriplegia, C1-C4 incomplete: Secondary | ICD-10-CM | POA: Diagnosis not present

## 2021-11-19 DIAGNOSIS — M5001 Cervical disc disorder with myelopathy,  high cervical region: Secondary | ICD-10-CM | POA: Diagnosis not present

## 2021-11-19 DIAGNOSIS — E039 Hypothyroidism, unspecified: Secondary | ICD-10-CM | POA: Diagnosis not present

## 2021-11-19 DIAGNOSIS — I129 Hypertensive chronic kidney disease with stage 1 through stage 4 chronic kidney disease, or unspecified chronic kidney disease: Secondary | ICD-10-CM | POA: Diagnosis not present

## 2021-11-19 DIAGNOSIS — E1169 Type 2 diabetes mellitus with other specified complication: Secondary | ICD-10-CM | POA: Diagnosis not present

## 2021-11-19 DIAGNOSIS — M069 Rheumatoid arthritis, unspecified: Secondary | ICD-10-CM | POA: Diagnosis not present

## 2021-11-19 DIAGNOSIS — R0689 Other abnormalities of breathing: Secondary | ICD-10-CM | POA: Diagnosis not present

## 2021-11-20 DIAGNOSIS — E039 Hypothyroidism, unspecified: Secondary | ICD-10-CM | POA: Diagnosis not present

## 2021-11-20 DIAGNOSIS — E1169 Type 2 diabetes mellitus with other specified complication: Secondary | ICD-10-CM | POA: Diagnosis not present

## 2021-11-20 DIAGNOSIS — R1310 Dysphagia, unspecified: Secondary | ICD-10-CM | POA: Diagnosis not present

## 2021-11-20 DIAGNOSIS — M48061 Spinal stenosis, lumbar region without neurogenic claudication: Secondary | ICD-10-CM | POA: Diagnosis not present

## 2021-11-20 DIAGNOSIS — M0510 Rheumatoid lung disease with rheumatoid arthritis of unspecified site: Secondary | ICD-10-CM | POA: Diagnosis not present

## 2021-11-20 DIAGNOSIS — I129 Hypertensive chronic kidney disease with stage 1 through stage 4 chronic kidney disease, or unspecified chronic kidney disease: Secondary | ICD-10-CM | POA: Diagnosis not present

## 2021-11-20 DIAGNOSIS — M5001 Cervical disc disorder with myelopathy,  high cervical region: Secondary | ICD-10-CM | POA: Diagnosis not present

## 2021-11-20 DIAGNOSIS — M5136 Other intervertebral disc degeneration, lumbar region: Secondary | ICD-10-CM | POA: Diagnosis not present

## 2021-11-20 DIAGNOSIS — M069 Rheumatoid arthritis, unspecified: Secondary | ICD-10-CM | POA: Diagnosis not present

## 2021-11-20 DIAGNOSIS — Z981 Arthrodesis status: Secondary | ICD-10-CM | POA: Diagnosis not present

## 2021-11-20 DIAGNOSIS — N1831 Chronic kidney disease, stage 3a: Secondary | ICD-10-CM | POA: Diagnosis not present

## 2021-11-20 DIAGNOSIS — E1122 Type 2 diabetes mellitus with diabetic chronic kidney disease: Secondary | ICD-10-CM | POA: Diagnosis not present

## 2021-11-20 DIAGNOSIS — G8252 Quadriplegia, C1-C4 incomplete: Secondary | ICD-10-CM | POA: Diagnosis not present

## 2021-11-20 DIAGNOSIS — I10 Essential (primary) hypertension: Secondary | ICD-10-CM | POA: Diagnosis not present

## 2021-11-20 DIAGNOSIS — E7800 Pure hypercholesterolemia, unspecified: Secondary | ICD-10-CM | POA: Diagnosis not present

## 2021-11-20 DIAGNOSIS — Z7982 Long term (current) use of aspirin: Secondary | ICD-10-CM | POA: Diagnosis not present

## 2021-11-20 DIAGNOSIS — Z7952 Long term (current) use of systemic steroids: Secondary | ICD-10-CM | POA: Diagnosis not present

## 2021-11-21 DIAGNOSIS — E1169 Type 2 diabetes mellitus with other specified complication: Secondary | ICD-10-CM | POA: Diagnosis not present

## 2021-11-21 DIAGNOSIS — Z7952 Long term (current) use of systemic steroids: Secondary | ICD-10-CM | POA: Diagnosis not present

## 2021-11-21 DIAGNOSIS — Z981 Arthrodesis status: Secondary | ICD-10-CM | POA: Diagnosis not present

## 2021-11-21 DIAGNOSIS — I129 Hypertensive chronic kidney disease with stage 1 through stage 4 chronic kidney disease, or unspecified chronic kidney disease: Secondary | ICD-10-CM | POA: Diagnosis not present

## 2021-11-21 DIAGNOSIS — G8252 Quadriplegia, C1-C4 incomplete: Secondary | ICD-10-CM | POA: Diagnosis not present

## 2021-11-21 DIAGNOSIS — M5136 Other intervertebral disc degeneration, lumbar region: Secondary | ICD-10-CM | POA: Diagnosis not present

## 2021-11-21 DIAGNOSIS — N1831 Chronic kidney disease, stage 3a: Secondary | ICD-10-CM | POA: Diagnosis not present

## 2021-11-21 DIAGNOSIS — E039 Hypothyroidism, unspecified: Secondary | ICD-10-CM | POA: Diagnosis not present

## 2021-11-21 DIAGNOSIS — M5001 Cervical disc disorder with myelopathy,  high cervical region: Secondary | ICD-10-CM | POA: Diagnosis not present

## 2021-11-21 DIAGNOSIS — Z7982 Long term (current) use of aspirin: Secondary | ICD-10-CM | POA: Diagnosis not present

## 2021-11-21 DIAGNOSIS — M069 Rheumatoid arthritis, unspecified: Secondary | ICD-10-CM | POA: Diagnosis not present

## 2021-11-21 DIAGNOSIS — M48061 Spinal stenosis, lumbar region without neurogenic claudication: Secondary | ICD-10-CM | POA: Diagnosis not present

## 2021-11-21 DIAGNOSIS — E1122 Type 2 diabetes mellitus with diabetic chronic kidney disease: Secondary | ICD-10-CM | POA: Diagnosis not present

## 2021-11-21 DIAGNOSIS — E7800 Pure hypercholesterolemia, unspecified: Secondary | ICD-10-CM | POA: Diagnosis not present

## 2021-11-21 DIAGNOSIS — R1310 Dysphagia, unspecified: Secondary | ICD-10-CM | POA: Diagnosis not present

## 2021-11-22 DIAGNOSIS — M5136 Other intervertebral disc degeneration, lumbar region: Secondary | ICD-10-CM | POA: Diagnosis not present

## 2021-11-22 DIAGNOSIS — Z7982 Long term (current) use of aspirin: Secondary | ICD-10-CM | POA: Diagnosis not present

## 2021-11-22 DIAGNOSIS — Z981 Arthrodesis status: Secondary | ICD-10-CM | POA: Diagnosis not present

## 2021-11-22 DIAGNOSIS — E039 Hypothyroidism, unspecified: Secondary | ICD-10-CM | POA: Diagnosis not present

## 2021-11-22 DIAGNOSIS — R1310 Dysphagia, unspecified: Secondary | ICD-10-CM | POA: Diagnosis not present

## 2021-11-22 DIAGNOSIS — M48061 Spinal stenosis, lumbar region without neurogenic claudication: Secondary | ICD-10-CM | POA: Diagnosis not present

## 2021-11-22 DIAGNOSIS — M5001 Cervical disc disorder with myelopathy,  high cervical region: Secondary | ICD-10-CM | POA: Diagnosis not present

## 2021-11-22 DIAGNOSIS — N1831 Chronic kidney disease, stage 3a: Secondary | ICD-10-CM | POA: Diagnosis not present

## 2021-11-22 DIAGNOSIS — E1122 Type 2 diabetes mellitus with diabetic chronic kidney disease: Secondary | ICD-10-CM | POA: Diagnosis not present

## 2021-11-22 DIAGNOSIS — G8252 Quadriplegia, C1-C4 incomplete: Secondary | ICD-10-CM | POA: Diagnosis not present

## 2021-11-22 DIAGNOSIS — I129 Hypertensive chronic kidney disease with stage 1 through stage 4 chronic kidney disease, or unspecified chronic kidney disease: Secondary | ICD-10-CM | POA: Diagnosis not present

## 2021-11-22 DIAGNOSIS — M069 Rheumatoid arthritis, unspecified: Secondary | ICD-10-CM | POA: Diagnosis not present

## 2021-11-22 DIAGNOSIS — E1169 Type 2 diabetes mellitus with other specified complication: Secondary | ICD-10-CM | POA: Diagnosis not present

## 2021-11-22 DIAGNOSIS — E7800 Pure hypercholesterolemia, unspecified: Secondary | ICD-10-CM | POA: Diagnosis not present

## 2021-11-22 DIAGNOSIS — Z7952 Long term (current) use of systemic steroids: Secondary | ICD-10-CM | POA: Diagnosis not present

## 2021-11-26 DIAGNOSIS — G8252 Quadriplegia, C1-C4 incomplete: Secondary | ICD-10-CM | POA: Diagnosis not present

## 2021-11-26 DIAGNOSIS — E039 Hypothyroidism, unspecified: Secondary | ICD-10-CM | POA: Diagnosis not present

## 2021-11-26 DIAGNOSIS — N1831 Chronic kidney disease, stage 3a: Secondary | ICD-10-CM | POA: Diagnosis not present

## 2021-11-26 DIAGNOSIS — M48061 Spinal stenosis, lumbar region without neurogenic claudication: Secondary | ICD-10-CM | POA: Diagnosis not present

## 2021-11-26 DIAGNOSIS — E1122 Type 2 diabetes mellitus with diabetic chronic kidney disease: Secondary | ICD-10-CM | POA: Diagnosis not present

## 2021-11-26 DIAGNOSIS — M5136 Other intervertebral disc degeneration, lumbar region: Secondary | ICD-10-CM | POA: Diagnosis not present

## 2021-11-26 DIAGNOSIS — E7800 Pure hypercholesterolemia, unspecified: Secondary | ICD-10-CM | POA: Diagnosis not present

## 2021-11-26 DIAGNOSIS — R1310 Dysphagia, unspecified: Secondary | ICD-10-CM | POA: Diagnosis not present

## 2021-11-26 DIAGNOSIS — E1169 Type 2 diabetes mellitus with other specified complication: Secondary | ICD-10-CM | POA: Diagnosis not present

## 2021-11-26 DIAGNOSIS — M5001 Cervical disc disorder with myelopathy,  high cervical region: Secondary | ICD-10-CM | POA: Diagnosis not present

## 2021-11-26 DIAGNOSIS — Z7952 Long term (current) use of systemic steroids: Secondary | ICD-10-CM | POA: Diagnosis not present

## 2021-11-26 DIAGNOSIS — M069 Rheumatoid arthritis, unspecified: Secondary | ICD-10-CM | POA: Diagnosis not present

## 2021-11-26 DIAGNOSIS — I129 Hypertensive chronic kidney disease with stage 1 through stage 4 chronic kidney disease, or unspecified chronic kidney disease: Secondary | ICD-10-CM | POA: Diagnosis not present

## 2021-11-26 DIAGNOSIS — Z7982 Long term (current) use of aspirin: Secondary | ICD-10-CM | POA: Diagnosis not present

## 2021-11-26 DIAGNOSIS — Z981 Arthrodesis status: Secondary | ICD-10-CM | POA: Diagnosis not present

## 2021-11-28 DIAGNOSIS — E039 Hypothyroidism, unspecified: Secondary | ICD-10-CM | POA: Diagnosis not present

## 2021-11-28 DIAGNOSIS — N1831 Chronic kidney disease, stage 3a: Secondary | ICD-10-CM | POA: Diagnosis not present

## 2021-11-28 DIAGNOSIS — E7800 Pure hypercholesterolemia, unspecified: Secondary | ICD-10-CM | POA: Diagnosis not present

## 2021-11-28 DIAGNOSIS — M069 Rheumatoid arthritis, unspecified: Secondary | ICD-10-CM | POA: Diagnosis not present

## 2021-11-28 DIAGNOSIS — R1310 Dysphagia, unspecified: Secondary | ICD-10-CM | POA: Diagnosis not present

## 2021-11-28 DIAGNOSIS — Z981 Arthrodesis status: Secondary | ICD-10-CM | POA: Diagnosis not present

## 2021-11-28 DIAGNOSIS — E1122 Type 2 diabetes mellitus with diabetic chronic kidney disease: Secondary | ICD-10-CM | POA: Diagnosis not present

## 2021-11-28 DIAGNOSIS — I129 Hypertensive chronic kidney disease with stage 1 through stage 4 chronic kidney disease, or unspecified chronic kidney disease: Secondary | ICD-10-CM | POA: Diagnosis not present

## 2021-11-28 DIAGNOSIS — M5136 Other intervertebral disc degeneration, lumbar region: Secondary | ICD-10-CM | POA: Diagnosis not present

## 2021-11-28 DIAGNOSIS — E1169 Type 2 diabetes mellitus with other specified complication: Secondary | ICD-10-CM | POA: Diagnosis not present

## 2021-11-28 DIAGNOSIS — M5001 Cervical disc disorder with myelopathy,  high cervical region: Secondary | ICD-10-CM | POA: Diagnosis not present

## 2021-11-28 DIAGNOSIS — M48061 Spinal stenosis, lumbar region without neurogenic claudication: Secondary | ICD-10-CM | POA: Diagnosis not present

## 2021-11-28 DIAGNOSIS — Z7982 Long term (current) use of aspirin: Secondary | ICD-10-CM | POA: Diagnosis not present

## 2021-11-28 DIAGNOSIS — G8252 Quadriplegia, C1-C4 incomplete: Secondary | ICD-10-CM | POA: Diagnosis not present

## 2021-11-28 DIAGNOSIS — Z7952 Long term (current) use of systemic steroids: Secondary | ICD-10-CM | POA: Diagnosis not present

## 2021-12-03 DIAGNOSIS — E1169 Type 2 diabetes mellitus with other specified complication: Secondary | ICD-10-CM | POA: Diagnosis not present

## 2021-12-03 DIAGNOSIS — E7800 Pure hypercholesterolemia, unspecified: Secondary | ICD-10-CM | POA: Diagnosis not present

## 2021-12-03 DIAGNOSIS — M069 Rheumatoid arthritis, unspecified: Secondary | ICD-10-CM | POA: Diagnosis not present

## 2021-12-03 DIAGNOSIS — Z7952 Long term (current) use of systemic steroids: Secondary | ICD-10-CM | POA: Diagnosis not present

## 2021-12-03 DIAGNOSIS — E039 Hypothyroidism, unspecified: Secondary | ICD-10-CM | POA: Diagnosis not present

## 2021-12-03 DIAGNOSIS — I129 Hypertensive chronic kidney disease with stage 1 through stage 4 chronic kidney disease, or unspecified chronic kidney disease: Secondary | ICD-10-CM | POA: Diagnosis not present

## 2021-12-03 DIAGNOSIS — G8252 Quadriplegia, C1-C4 incomplete: Secondary | ICD-10-CM | POA: Diagnosis not present

## 2021-12-03 DIAGNOSIS — Z981 Arthrodesis status: Secondary | ICD-10-CM | POA: Diagnosis not present

## 2021-12-03 DIAGNOSIS — M5001 Cervical disc disorder with myelopathy,  high cervical region: Secondary | ICD-10-CM | POA: Diagnosis not present

## 2021-12-03 DIAGNOSIS — R1310 Dysphagia, unspecified: Secondary | ICD-10-CM | POA: Diagnosis not present

## 2021-12-03 DIAGNOSIS — M48061 Spinal stenosis, lumbar region without neurogenic claudication: Secondary | ICD-10-CM | POA: Diagnosis not present

## 2021-12-03 DIAGNOSIS — E1122 Type 2 diabetes mellitus with diabetic chronic kidney disease: Secondary | ICD-10-CM | POA: Diagnosis not present

## 2021-12-03 DIAGNOSIS — M5136 Other intervertebral disc degeneration, lumbar region: Secondary | ICD-10-CM | POA: Diagnosis not present

## 2021-12-03 DIAGNOSIS — Z7982 Long term (current) use of aspirin: Secondary | ICD-10-CM | POA: Diagnosis not present

## 2021-12-03 DIAGNOSIS — N1831 Chronic kidney disease, stage 3a: Secondary | ICD-10-CM | POA: Diagnosis not present

## 2021-12-10 DIAGNOSIS — Z7982 Long term (current) use of aspirin: Secondary | ICD-10-CM | POA: Diagnosis not present

## 2021-12-10 DIAGNOSIS — M5001 Cervical disc disorder with myelopathy,  high cervical region: Secondary | ICD-10-CM | POA: Diagnosis not present

## 2021-12-10 DIAGNOSIS — E7800 Pure hypercholesterolemia, unspecified: Secondary | ICD-10-CM | POA: Diagnosis not present

## 2021-12-10 DIAGNOSIS — M5136 Other intervertebral disc degeneration, lumbar region: Secondary | ICD-10-CM | POA: Diagnosis not present

## 2021-12-10 DIAGNOSIS — N1831 Chronic kidney disease, stage 3a: Secondary | ICD-10-CM | POA: Diagnosis not present

## 2021-12-10 DIAGNOSIS — I129 Hypertensive chronic kidney disease with stage 1 through stage 4 chronic kidney disease, or unspecified chronic kidney disease: Secondary | ICD-10-CM | POA: Diagnosis not present

## 2021-12-10 DIAGNOSIS — M069 Rheumatoid arthritis, unspecified: Secondary | ICD-10-CM | POA: Diagnosis not present

## 2021-12-10 DIAGNOSIS — E1122 Type 2 diabetes mellitus with diabetic chronic kidney disease: Secondary | ICD-10-CM | POA: Diagnosis not present

## 2021-12-10 DIAGNOSIS — R1310 Dysphagia, unspecified: Secondary | ICD-10-CM | POA: Diagnosis not present

## 2021-12-10 DIAGNOSIS — E039 Hypothyroidism, unspecified: Secondary | ICD-10-CM | POA: Diagnosis not present

## 2021-12-10 DIAGNOSIS — Z7952 Long term (current) use of systemic steroids: Secondary | ICD-10-CM | POA: Diagnosis not present

## 2021-12-10 DIAGNOSIS — M48061 Spinal stenosis, lumbar region without neurogenic claudication: Secondary | ICD-10-CM | POA: Diagnosis not present

## 2021-12-10 DIAGNOSIS — E1169 Type 2 diabetes mellitus with other specified complication: Secondary | ICD-10-CM | POA: Diagnosis not present

## 2021-12-10 DIAGNOSIS — Z981 Arthrodesis status: Secondary | ICD-10-CM | POA: Diagnosis not present

## 2021-12-10 DIAGNOSIS — G8252 Quadriplegia, C1-C4 incomplete: Secondary | ICD-10-CM | POA: Diagnosis not present

## 2021-12-11 DIAGNOSIS — R1310 Dysphagia, unspecified: Secondary | ICD-10-CM | POA: Diagnosis not present

## 2021-12-11 DIAGNOSIS — Z85528 Personal history of other malignant neoplasm of kidney: Secondary | ICD-10-CM | POA: Diagnosis not present

## 2021-12-11 DIAGNOSIS — I129 Hypertensive chronic kidney disease with stage 1 through stage 4 chronic kidney disease, or unspecified chronic kidney disease: Secondary | ICD-10-CM | POA: Diagnosis not present

## 2021-12-11 DIAGNOSIS — M48061 Spinal stenosis, lumbar region without neurogenic claudication: Secondary | ICD-10-CM | POA: Diagnosis not present

## 2021-12-11 DIAGNOSIS — M5136 Other intervertebral disc degeneration, lumbar region: Secondary | ICD-10-CM | POA: Diagnosis not present

## 2021-12-11 DIAGNOSIS — E1169 Type 2 diabetes mellitus with other specified complication: Secondary | ICD-10-CM | POA: Diagnosis not present

## 2021-12-11 DIAGNOSIS — M0510 Rheumatoid lung disease with rheumatoid arthritis of unspecified site: Secondary | ICD-10-CM | POA: Diagnosis not present

## 2021-12-11 DIAGNOSIS — E039 Hypothyroidism, unspecified: Secondary | ICD-10-CM | POA: Diagnosis not present

## 2021-12-11 DIAGNOSIS — M069 Rheumatoid arthritis, unspecified: Secondary | ICD-10-CM | POA: Diagnosis not present

## 2021-12-11 DIAGNOSIS — Z981 Arthrodesis status: Secondary | ICD-10-CM | POA: Diagnosis not present

## 2021-12-11 DIAGNOSIS — G8252 Quadriplegia, C1-C4 incomplete: Secondary | ICD-10-CM | POA: Diagnosis not present

## 2021-12-11 DIAGNOSIS — N1831 Chronic kidney disease, stage 3a: Secondary | ICD-10-CM | POA: Diagnosis not present

## 2021-12-11 DIAGNOSIS — Z7952 Long term (current) use of systemic steroids: Secondary | ICD-10-CM | POA: Diagnosis not present

## 2021-12-11 DIAGNOSIS — E7800 Pure hypercholesterolemia, unspecified: Secondary | ICD-10-CM | POA: Diagnosis not present

## 2021-12-11 DIAGNOSIS — M5001 Cervical disc disorder with myelopathy,  high cervical region: Secondary | ICD-10-CM | POA: Diagnosis not present

## 2021-12-11 DIAGNOSIS — Z7982 Long term (current) use of aspirin: Secondary | ICD-10-CM | POA: Diagnosis not present

## 2021-12-11 DIAGNOSIS — I10 Essential (primary) hypertension: Secondary | ICD-10-CM | POA: Diagnosis not present

## 2021-12-11 DIAGNOSIS — E1122 Type 2 diabetes mellitus with diabetic chronic kidney disease: Secondary | ICD-10-CM | POA: Diagnosis not present

## 2021-12-18 DIAGNOSIS — M5136 Other intervertebral disc degeneration, lumbar region: Secondary | ICD-10-CM | POA: Diagnosis not present

## 2021-12-18 DIAGNOSIS — I129 Hypertensive chronic kidney disease with stage 1 through stage 4 chronic kidney disease, or unspecified chronic kidney disease: Secondary | ICD-10-CM | POA: Diagnosis not present

## 2021-12-18 DIAGNOSIS — E1122 Type 2 diabetes mellitus with diabetic chronic kidney disease: Secondary | ICD-10-CM | POA: Diagnosis not present

## 2021-12-18 DIAGNOSIS — R1310 Dysphagia, unspecified: Secondary | ICD-10-CM | POA: Diagnosis not present

## 2021-12-18 DIAGNOSIS — Z981 Arthrodesis status: Secondary | ICD-10-CM | POA: Diagnosis not present

## 2021-12-18 DIAGNOSIS — M5001 Cervical disc disorder with myelopathy,  high cervical region: Secondary | ICD-10-CM | POA: Diagnosis not present

## 2021-12-18 DIAGNOSIS — E7800 Pure hypercholesterolemia, unspecified: Secondary | ICD-10-CM | POA: Diagnosis not present

## 2021-12-18 DIAGNOSIS — M069 Rheumatoid arthritis, unspecified: Secondary | ICD-10-CM | POA: Diagnosis not present

## 2021-12-18 DIAGNOSIS — G8252 Quadriplegia, C1-C4 incomplete: Secondary | ICD-10-CM | POA: Diagnosis not present

## 2021-12-18 DIAGNOSIS — Z7982 Long term (current) use of aspirin: Secondary | ICD-10-CM | POA: Diagnosis not present

## 2021-12-18 DIAGNOSIS — E1169 Type 2 diabetes mellitus with other specified complication: Secondary | ICD-10-CM | POA: Diagnosis not present

## 2021-12-18 DIAGNOSIS — Z7952 Long term (current) use of systemic steroids: Secondary | ICD-10-CM | POA: Diagnosis not present

## 2021-12-18 DIAGNOSIS — N1831 Chronic kidney disease, stage 3a: Secondary | ICD-10-CM | POA: Diagnosis not present

## 2021-12-18 DIAGNOSIS — E039 Hypothyroidism, unspecified: Secondary | ICD-10-CM | POA: Diagnosis not present

## 2021-12-18 DIAGNOSIS — M48061 Spinal stenosis, lumbar region without neurogenic claudication: Secondary | ICD-10-CM | POA: Diagnosis not present

## 2021-12-20 DIAGNOSIS — Z981 Arthrodesis status: Secondary | ICD-10-CM | POA: Diagnosis not present

## 2021-12-20 DIAGNOSIS — E1169 Type 2 diabetes mellitus with other specified complication: Secondary | ICD-10-CM | POA: Diagnosis not present

## 2021-12-20 DIAGNOSIS — E1122 Type 2 diabetes mellitus with diabetic chronic kidney disease: Secondary | ICD-10-CM | POA: Diagnosis not present

## 2021-12-20 DIAGNOSIS — M5001 Cervical disc disorder with myelopathy,  high cervical region: Secondary | ICD-10-CM | POA: Diagnosis not present

## 2021-12-20 DIAGNOSIS — N1831 Chronic kidney disease, stage 3a: Secondary | ICD-10-CM | POA: Diagnosis not present

## 2021-12-20 DIAGNOSIS — I129 Hypertensive chronic kidney disease with stage 1 through stage 4 chronic kidney disease, or unspecified chronic kidney disease: Secondary | ICD-10-CM | POA: Diagnosis not present

## 2021-12-20 DIAGNOSIS — E039 Hypothyroidism, unspecified: Secondary | ICD-10-CM | POA: Diagnosis not present

## 2021-12-20 DIAGNOSIS — M069 Rheumatoid arthritis, unspecified: Secondary | ICD-10-CM | POA: Diagnosis not present

## 2021-12-20 DIAGNOSIS — M5136 Other intervertebral disc degeneration, lumbar region: Secondary | ICD-10-CM | POA: Diagnosis not present

## 2021-12-20 DIAGNOSIS — G8252 Quadriplegia, C1-C4 incomplete: Secondary | ICD-10-CM | POA: Diagnosis not present

## 2021-12-20 DIAGNOSIS — Z7952 Long term (current) use of systemic steroids: Secondary | ICD-10-CM | POA: Diagnosis not present

## 2021-12-20 DIAGNOSIS — Z7982 Long term (current) use of aspirin: Secondary | ICD-10-CM | POA: Diagnosis not present

## 2021-12-20 DIAGNOSIS — M48061 Spinal stenosis, lumbar region without neurogenic claudication: Secondary | ICD-10-CM | POA: Diagnosis not present

## 2021-12-20 DIAGNOSIS — R1310 Dysphagia, unspecified: Secondary | ICD-10-CM | POA: Diagnosis not present

## 2021-12-20 DIAGNOSIS — E7800 Pure hypercholesterolemia, unspecified: Secondary | ICD-10-CM | POA: Diagnosis not present

## 2021-12-24 DIAGNOSIS — N1831 Chronic kidney disease, stage 3a: Secondary | ICD-10-CM | POA: Diagnosis not present

## 2021-12-24 DIAGNOSIS — I129 Hypertensive chronic kidney disease with stage 1 through stage 4 chronic kidney disease, or unspecified chronic kidney disease: Secondary | ICD-10-CM | POA: Diagnosis not present

## 2021-12-24 DIAGNOSIS — Z7952 Long term (current) use of systemic steroids: Secondary | ICD-10-CM | POA: Diagnosis not present

## 2021-12-24 DIAGNOSIS — G8252 Quadriplegia, C1-C4 incomplete: Secondary | ICD-10-CM | POA: Diagnosis not present

## 2021-12-24 DIAGNOSIS — M5136 Other intervertebral disc degeneration, lumbar region: Secondary | ICD-10-CM | POA: Diagnosis not present

## 2021-12-24 DIAGNOSIS — Z7982 Long term (current) use of aspirin: Secondary | ICD-10-CM | POA: Diagnosis not present

## 2021-12-24 DIAGNOSIS — E1122 Type 2 diabetes mellitus with diabetic chronic kidney disease: Secondary | ICD-10-CM | POA: Diagnosis not present

## 2021-12-24 DIAGNOSIS — E039 Hypothyroidism, unspecified: Secondary | ICD-10-CM | POA: Diagnosis not present

## 2021-12-24 DIAGNOSIS — M5001 Cervical disc disorder with myelopathy,  high cervical region: Secondary | ICD-10-CM | POA: Diagnosis not present

## 2021-12-24 DIAGNOSIS — M069 Rheumatoid arthritis, unspecified: Secondary | ICD-10-CM | POA: Diagnosis not present

## 2021-12-24 DIAGNOSIS — E7800 Pure hypercholesterolemia, unspecified: Secondary | ICD-10-CM | POA: Diagnosis not present

## 2021-12-24 DIAGNOSIS — Z981 Arthrodesis status: Secondary | ICD-10-CM | POA: Diagnosis not present

## 2021-12-24 DIAGNOSIS — R1310 Dysphagia, unspecified: Secondary | ICD-10-CM | POA: Diagnosis not present

## 2021-12-24 DIAGNOSIS — M48061 Spinal stenosis, lumbar region without neurogenic claudication: Secondary | ICD-10-CM | POA: Diagnosis not present

## 2021-12-24 DIAGNOSIS — E1169 Type 2 diabetes mellitus with other specified complication: Secondary | ICD-10-CM | POA: Diagnosis not present

## 2021-12-25 DIAGNOSIS — Z7952 Long term (current) use of systemic steroids: Secondary | ICD-10-CM | POA: Diagnosis not present

## 2021-12-25 DIAGNOSIS — R1310 Dysphagia, unspecified: Secondary | ICD-10-CM | POA: Diagnosis not present

## 2021-12-25 DIAGNOSIS — M5001 Cervical disc disorder with myelopathy,  high cervical region: Secondary | ICD-10-CM | POA: Diagnosis not present

## 2021-12-25 DIAGNOSIS — E1122 Type 2 diabetes mellitus with diabetic chronic kidney disease: Secondary | ICD-10-CM | POA: Diagnosis not present

## 2021-12-25 DIAGNOSIS — E7800 Pure hypercholesterolemia, unspecified: Secondary | ICD-10-CM | POA: Diagnosis not present

## 2021-12-25 DIAGNOSIS — M5136 Other intervertebral disc degeneration, lumbar region: Secondary | ICD-10-CM | POA: Diagnosis not present

## 2021-12-25 DIAGNOSIS — G8252 Quadriplegia, C1-C4 incomplete: Secondary | ICD-10-CM | POA: Diagnosis not present

## 2021-12-25 DIAGNOSIS — N1831 Chronic kidney disease, stage 3a: Secondary | ICD-10-CM | POA: Diagnosis not present

## 2021-12-25 DIAGNOSIS — E1169 Type 2 diabetes mellitus with other specified complication: Secondary | ICD-10-CM | POA: Diagnosis not present

## 2021-12-25 DIAGNOSIS — E039 Hypothyroidism, unspecified: Secondary | ICD-10-CM | POA: Diagnosis not present

## 2021-12-25 DIAGNOSIS — Z981 Arthrodesis status: Secondary | ICD-10-CM | POA: Diagnosis not present

## 2021-12-25 DIAGNOSIS — M069 Rheumatoid arthritis, unspecified: Secondary | ICD-10-CM | POA: Diagnosis not present

## 2021-12-25 DIAGNOSIS — I129 Hypertensive chronic kidney disease with stage 1 through stage 4 chronic kidney disease, or unspecified chronic kidney disease: Secondary | ICD-10-CM | POA: Diagnosis not present

## 2021-12-25 DIAGNOSIS — I10 Essential (primary) hypertension: Secondary | ICD-10-CM | POA: Diagnosis not present

## 2021-12-25 DIAGNOSIS — Z7982 Long term (current) use of aspirin: Secondary | ICD-10-CM | POA: Diagnosis not present

## 2021-12-25 DIAGNOSIS — M48061 Spinal stenosis, lumbar region without neurogenic claudication: Secondary | ICD-10-CM | POA: Diagnosis not present

## 2021-12-27 DIAGNOSIS — I10 Essential (primary) hypertension: Secondary | ICD-10-CM | POA: Diagnosis not present

## 2021-12-27 DIAGNOSIS — G3184 Mild cognitive impairment, so stated: Secondary | ICD-10-CM | POA: Diagnosis not present

## 2021-12-27 DIAGNOSIS — M0580 Other rheumatoid arthritis with rheumatoid factor of unspecified site: Secondary | ICD-10-CM | POA: Diagnosis not present

## 2022-01-03 DIAGNOSIS — M48061 Spinal stenosis, lumbar region without neurogenic claudication: Secondary | ICD-10-CM | POA: Diagnosis not present

## 2022-01-03 DIAGNOSIS — Z981 Arthrodesis status: Secondary | ICD-10-CM | POA: Diagnosis not present

## 2022-01-03 DIAGNOSIS — Z7982 Long term (current) use of aspirin: Secondary | ICD-10-CM | POA: Diagnosis not present

## 2022-01-03 DIAGNOSIS — M5001 Cervical disc disorder with myelopathy,  high cervical region: Secondary | ICD-10-CM | POA: Diagnosis not present

## 2022-01-03 DIAGNOSIS — E1122 Type 2 diabetes mellitus with diabetic chronic kidney disease: Secondary | ICD-10-CM | POA: Diagnosis not present

## 2022-01-03 DIAGNOSIS — M069 Rheumatoid arthritis, unspecified: Secondary | ICD-10-CM | POA: Diagnosis not present

## 2022-01-03 DIAGNOSIS — R1310 Dysphagia, unspecified: Secondary | ICD-10-CM | POA: Diagnosis not present

## 2022-01-03 DIAGNOSIS — Z7952 Long term (current) use of systemic steroids: Secondary | ICD-10-CM | POA: Diagnosis not present

## 2022-01-03 DIAGNOSIS — E1169 Type 2 diabetes mellitus with other specified complication: Secondary | ICD-10-CM | POA: Diagnosis not present

## 2022-01-03 DIAGNOSIS — N1831 Chronic kidney disease, stage 3a: Secondary | ICD-10-CM | POA: Diagnosis not present

## 2022-01-03 DIAGNOSIS — I129 Hypertensive chronic kidney disease with stage 1 through stage 4 chronic kidney disease, or unspecified chronic kidney disease: Secondary | ICD-10-CM | POA: Diagnosis not present

## 2022-01-03 DIAGNOSIS — G8252 Quadriplegia, C1-C4 incomplete: Secondary | ICD-10-CM | POA: Diagnosis not present

## 2022-01-03 DIAGNOSIS — E7800 Pure hypercholesterolemia, unspecified: Secondary | ICD-10-CM | POA: Diagnosis not present

## 2022-01-03 DIAGNOSIS — E039 Hypothyroidism, unspecified: Secondary | ICD-10-CM | POA: Diagnosis not present

## 2022-01-03 DIAGNOSIS — M5136 Other intervertebral disc degeneration, lumbar region: Secondary | ICD-10-CM | POA: Diagnosis not present

## 2022-01-08 DIAGNOSIS — I10 Essential (primary) hypertension: Secondary | ICD-10-CM | POA: Diagnosis not present

## 2022-01-08 DIAGNOSIS — Z85528 Personal history of other malignant neoplasm of kidney: Secondary | ICD-10-CM | POA: Diagnosis not present

## 2022-01-08 DIAGNOSIS — M0580 Other rheumatoid arthritis with rheumatoid factor of unspecified site: Secondary | ICD-10-CM | POA: Diagnosis not present

## 2022-01-09 DIAGNOSIS — M069 Rheumatoid arthritis, unspecified: Secondary | ICD-10-CM | POA: Diagnosis not present

## 2022-01-09 DIAGNOSIS — I129 Hypertensive chronic kidney disease with stage 1 through stage 4 chronic kidney disease, or unspecified chronic kidney disease: Secondary | ICD-10-CM | POA: Diagnosis not present

## 2022-01-09 DIAGNOSIS — E7800 Pure hypercholesterolemia, unspecified: Secondary | ICD-10-CM | POA: Diagnosis not present

## 2022-01-09 DIAGNOSIS — Z7952 Long term (current) use of systemic steroids: Secondary | ICD-10-CM | POA: Diagnosis not present

## 2022-01-09 DIAGNOSIS — N1831 Chronic kidney disease, stage 3a: Secondary | ICD-10-CM | POA: Diagnosis not present

## 2022-01-09 DIAGNOSIS — M5001 Cervical disc disorder with myelopathy,  high cervical region: Secondary | ICD-10-CM | POA: Diagnosis not present

## 2022-01-09 DIAGNOSIS — E039 Hypothyroidism, unspecified: Secondary | ICD-10-CM | POA: Diagnosis not present

## 2022-01-09 DIAGNOSIS — M5136 Other intervertebral disc degeneration, lumbar region: Secondary | ICD-10-CM | POA: Diagnosis not present

## 2022-01-09 DIAGNOSIS — Z7982 Long term (current) use of aspirin: Secondary | ICD-10-CM | POA: Diagnosis not present

## 2022-01-09 DIAGNOSIS — R1310 Dysphagia, unspecified: Secondary | ICD-10-CM | POA: Diagnosis not present

## 2022-01-09 DIAGNOSIS — M48061 Spinal stenosis, lumbar region without neurogenic claudication: Secondary | ICD-10-CM | POA: Diagnosis not present

## 2022-01-09 DIAGNOSIS — E1169 Type 2 diabetes mellitus with other specified complication: Secondary | ICD-10-CM | POA: Diagnosis not present

## 2022-01-09 DIAGNOSIS — Z981 Arthrodesis status: Secondary | ICD-10-CM | POA: Diagnosis not present

## 2022-01-09 DIAGNOSIS — G8252 Quadriplegia, C1-C4 incomplete: Secondary | ICD-10-CM | POA: Diagnosis not present

## 2022-01-09 DIAGNOSIS — E1122 Type 2 diabetes mellitus with diabetic chronic kidney disease: Secondary | ICD-10-CM | POA: Diagnosis not present

## 2022-01-10 DIAGNOSIS — G8252 Quadriplegia, C1-C4 incomplete: Secondary | ICD-10-CM | POA: Diagnosis not present

## 2022-01-10 DIAGNOSIS — R1310 Dysphagia, unspecified: Secondary | ICD-10-CM | POA: Diagnosis not present

## 2022-01-10 DIAGNOSIS — Z7952 Long term (current) use of systemic steroids: Secondary | ICD-10-CM | POA: Diagnosis not present

## 2022-01-10 DIAGNOSIS — M48061 Spinal stenosis, lumbar region without neurogenic claudication: Secondary | ICD-10-CM | POA: Diagnosis not present

## 2022-01-10 DIAGNOSIS — N1831 Chronic kidney disease, stage 3a: Secondary | ICD-10-CM | POA: Diagnosis not present

## 2022-01-10 DIAGNOSIS — I129 Hypertensive chronic kidney disease with stage 1 through stage 4 chronic kidney disease, or unspecified chronic kidney disease: Secondary | ICD-10-CM | POA: Diagnosis not present

## 2022-01-10 DIAGNOSIS — M5001 Cervical disc disorder with myelopathy,  high cervical region: Secondary | ICD-10-CM | POA: Diagnosis not present

## 2022-01-10 DIAGNOSIS — E7800 Pure hypercholesterolemia, unspecified: Secondary | ICD-10-CM | POA: Diagnosis not present

## 2022-01-10 DIAGNOSIS — M069 Rheumatoid arthritis, unspecified: Secondary | ICD-10-CM | POA: Diagnosis not present

## 2022-01-10 DIAGNOSIS — E1122 Type 2 diabetes mellitus with diabetic chronic kidney disease: Secondary | ICD-10-CM | POA: Diagnosis not present

## 2022-01-10 DIAGNOSIS — E1169 Type 2 diabetes mellitus with other specified complication: Secondary | ICD-10-CM | POA: Diagnosis not present

## 2022-01-10 DIAGNOSIS — M5136 Other intervertebral disc degeneration, lumbar region: Secondary | ICD-10-CM | POA: Diagnosis not present

## 2022-01-10 DIAGNOSIS — Z981 Arthrodesis status: Secondary | ICD-10-CM | POA: Diagnosis not present

## 2022-01-10 DIAGNOSIS — E039 Hypothyroidism, unspecified: Secondary | ICD-10-CM | POA: Diagnosis not present

## 2022-01-10 DIAGNOSIS — Z7982 Long term (current) use of aspirin: Secondary | ICD-10-CM | POA: Diagnosis not present

## 2022-01-17 DIAGNOSIS — G8252 Quadriplegia, C1-C4 incomplete: Secondary | ICD-10-CM | POA: Diagnosis not present

## 2022-01-17 DIAGNOSIS — M069 Rheumatoid arthritis, unspecified: Secondary | ICD-10-CM | POA: Diagnosis not present

## 2022-01-17 DIAGNOSIS — E1122 Type 2 diabetes mellitus with diabetic chronic kidney disease: Secondary | ICD-10-CM | POA: Diagnosis not present

## 2022-01-17 DIAGNOSIS — R1310 Dysphagia, unspecified: Secondary | ICD-10-CM | POA: Diagnosis not present

## 2022-01-17 DIAGNOSIS — N1831 Chronic kidney disease, stage 3a: Secondary | ICD-10-CM | POA: Diagnosis not present

## 2022-01-17 DIAGNOSIS — Z981 Arthrodesis status: Secondary | ICD-10-CM | POA: Diagnosis not present

## 2022-01-17 DIAGNOSIS — M5001 Cervical disc disorder with myelopathy,  high cervical region: Secondary | ICD-10-CM | POA: Diagnosis not present

## 2022-01-17 DIAGNOSIS — E7800 Pure hypercholesterolemia, unspecified: Secondary | ICD-10-CM | POA: Diagnosis not present

## 2022-01-17 DIAGNOSIS — Z7982 Long term (current) use of aspirin: Secondary | ICD-10-CM | POA: Diagnosis not present

## 2022-01-17 DIAGNOSIS — M48061 Spinal stenosis, lumbar region without neurogenic claudication: Secondary | ICD-10-CM | POA: Diagnosis not present

## 2022-01-17 DIAGNOSIS — Z7952 Long term (current) use of systemic steroids: Secondary | ICD-10-CM | POA: Diagnosis not present

## 2022-01-17 DIAGNOSIS — I129 Hypertensive chronic kidney disease with stage 1 through stage 4 chronic kidney disease, or unspecified chronic kidney disease: Secondary | ICD-10-CM | POA: Diagnosis not present

## 2022-01-17 DIAGNOSIS — E1169 Type 2 diabetes mellitus with other specified complication: Secondary | ICD-10-CM | POA: Diagnosis not present

## 2022-01-17 DIAGNOSIS — M5136 Other intervertebral disc degeneration, lumbar region: Secondary | ICD-10-CM | POA: Diagnosis not present

## 2022-01-17 DIAGNOSIS — E039 Hypothyroidism, unspecified: Secondary | ICD-10-CM | POA: Diagnosis not present

## 2022-01-24 DIAGNOSIS — E1169 Type 2 diabetes mellitus with other specified complication: Secondary | ICD-10-CM | POA: Diagnosis not present

## 2022-01-24 DIAGNOSIS — Z981 Arthrodesis status: Secondary | ICD-10-CM | POA: Diagnosis not present

## 2022-01-24 DIAGNOSIS — M5136 Other intervertebral disc degeneration, lumbar region: Secondary | ICD-10-CM | POA: Diagnosis not present

## 2022-01-24 DIAGNOSIS — E1122 Type 2 diabetes mellitus with diabetic chronic kidney disease: Secondary | ICD-10-CM | POA: Diagnosis not present

## 2022-01-24 DIAGNOSIS — M069 Rheumatoid arthritis, unspecified: Secondary | ICD-10-CM | POA: Diagnosis not present

## 2022-01-24 DIAGNOSIS — R1310 Dysphagia, unspecified: Secondary | ICD-10-CM | POA: Diagnosis not present

## 2022-01-24 DIAGNOSIS — E7800 Pure hypercholesterolemia, unspecified: Secondary | ICD-10-CM | POA: Diagnosis not present

## 2022-01-24 DIAGNOSIS — M5001 Cervical disc disorder with myelopathy,  high cervical region: Secondary | ICD-10-CM | POA: Diagnosis not present

## 2022-01-24 DIAGNOSIS — N1831 Chronic kidney disease, stage 3a: Secondary | ICD-10-CM | POA: Diagnosis not present

## 2022-01-24 DIAGNOSIS — Z7952 Long term (current) use of systemic steroids: Secondary | ICD-10-CM | POA: Diagnosis not present

## 2022-01-24 DIAGNOSIS — G8252 Quadriplegia, C1-C4 incomplete: Secondary | ICD-10-CM | POA: Diagnosis not present

## 2022-01-24 DIAGNOSIS — M48061 Spinal stenosis, lumbar region without neurogenic claudication: Secondary | ICD-10-CM | POA: Diagnosis not present

## 2022-01-24 DIAGNOSIS — Z7982 Long term (current) use of aspirin: Secondary | ICD-10-CM | POA: Diagnosis not present

## 2022-01-24 DIAGNOSIS — I129 Hypertensive chronic kidney disease with stage 1 through stage 4 chronic kidney disease, or unspecified chronic kidney disease: Secondary | ICD-10-CM | POA: Diagnosis not present

## 2022-01-24 DIAGNOSIS — E039 Hypothyroidism, unspecified: Secondary | ICD-10-CM | POA: Diagnosis not present

## 2022-01-29 DIAGNOSIS — N1831 Chronic kidney disease, stage 3a: Secondary | ICD-10-CM | POA: Diagnosis not present

## 2022-01-29 DIAGNOSIS — M5136 Other intervertebral disc degeneration, lumbar region: Secondary | ICD-10-CM | POA: Diagnosis not present

## 2022-01-29 DIAGNOSIS — E039 Hypothyroidism, unspecified: Secondary | ICD-10-CM | POA: Diagnosis not present

## 2022-01-29 DIAGNOSIS — Z7982 Long term (current) use of aspirin: Secondary | ICD-10-CM | POA: Diagnosis not present

## 2022-01-29 DIAGNOSIS — M069 Rheumatoid arthritis, unspecified: Secondary | ICD-10-CM | POA: Diagnosis not present

## 2022-01-29 DIAGNOSIS — Z7952 Long term (current) use of systemic steroids: Secondary | ICD-10-CM | POA: Diagnosis not present

## 2022-01-29 DIAGNOSIS — E1122 Type 2 diabetes mellitus with diabetic chronic kidney disease: Secondary | ICD-10-CM | POA: Diagnosis not present

## 2022-01-29 DIAGNOSIS — Z981 Arthrodesis status: Secondary | ICD-10-CM | POA: Diagnosis not present

## 2022-01-29 DIAGNOSIS — M5001 Cervical disc disorder with myelopathy,  high cervical region: Secondary | ICD-10-CM | POA: Diagnosis not present

## 2022-01-29 DIAGNOSIS — E1169 Type 2 diabetes mellitus with other specified complication: Secondary | ICD-10-CM | POA: Diagnosis not present

## 2022-01-29 DIAGNOSIS — I129 Hypertensive chronic kidney disease with stage 1 through stage 4 chronic kidney disease, or unspecified chronic kidney disease: Secondary | ICD-10-CM | POA: Diagnosis not present

## 2022-01-29 DIAGNOSIS — R1310 Dysphagia, unspecified: Secondary | ICD-10-CM | POA: Diagnosis not present

## 2022-01-29 DIAGNOSIS — E7800 Pure hypercholesterolemia, unspecified: Secondary | ICD-10-CM | POA: Diagnosis not present

## 2022-01-29 DIAGNOSIS — G8252 Quadriplegia, C1-C4 incomplete: Secondary | ICD-10-CM | POA: Diagnosis not present

## 2022-01-29 DIAGNOSIS — M48061 Spinal stenosis, lumbar region without neurogenic claudication: Secondary | ICD-10-CM | POA: Diagnosis not present

## 2022-02-05 DIAGNOSIS — M0580 Other rheumatoid arthritis with rheumatoid factor of unspecified site: Secondary | ICD-10-CM | POA: Diagnosis not present

## 2022-02-05 DIAGNOSIS — E039 Hypothyroidism, unspecified: Secondary | ICD-10-CM | POA: Diagnosis not present

## 2022-02-05 DIAGNOSIS — E7800 Pure hypercholesterolemia, unspecified: Secondary | ICD-10-CM | POA: Diagnosis not present

## 2022-02-05 DIAGNOSIS — M48061 Spinal stenosis, lumbar region without neurogenic claudication: Secondary | ICD-10-CM | POA: Diagnosis not present

## 2022-02-05 DIAGNOSIS — I129 Hypertensive chronic kidney disease with stage 1 through stage 4 chronic kidney disease, or unspecified chronic kidney disease: Secondary | ICD-10-CM | POA: Diagnosis not present

## 2022-02-05 DIAGNOSIS — Z7952 Long term (current) use of systemic steroids: Secondary | ICD-10-CM | POA: Diagnosis not present

## 2022-02-05 DIAGNOSIS — M5136 Other intervertebral disc degeneration, lumbar region: Secondary | ICD-10-CM | POA: Diagnosis not present

## 2022-02-05 DIAGNOSIS — I10 Essential (primary) hypertension: Secondary | ICD-10-CM | POA: Diagnosis not present

## 2022-02-05 DIAGNOSIS — E1169 Type 2 diabetes mellitus with other specified complication: Secondary | ICD-10-CM | POA: Diagnosis not present

## 2022-02-05 DIAGNOSIS — M069 Rheumatoid arthritis, unspecified: Secondary | ICD-10-CM | POA: Diagnosis not present

## 2022-02-05 DIAGNOSIS — R1310 Dysphagia, unspecified: Secondary | ICD-10-CM | POA: Diagnosis not present

## 2022-02-05 DIAGNOSIS — M5001 Cervical disc disorder with myelopathy,  high cervical region: Secondary | ICD-10-CM | POA: Diagnosis not present

## 2022-02-05 DIAGNOSIS — N1831 Chronic kidney disease, stage 3a: Secondary | ICD-10-CM | POA: Diagnosis not present

## 2022-02-05 DIAGNOSIS — G8252 Quadriplegia, C1-C4 incomplete: Secondary | ICD-10-CM | POA: Diagnosis not present

## 2022-02-05 DIAGNOSIS — Z981 Arthrodesis status: Secondary | ICD-10-CM | POA: Diagnosis not present

## 2022-02-05 DIAGNOSIS — Z7982 Long term (current) use of aspirin: Secondary | ICD-10-CM | POA: Diagnosis not present

## 2022-02-05 DIAGNOSIS — E1122 Type 2 diabetes mellitus with diabetic chronic kidney disease: Secondary | ICD-10-CM | POA: Diagnosis not present

## 2022-02-05 DIAGNOSIS — K5900 Constipation, unspecified: Secondary | ICD-10-CM | POA: Diagnosis not present

## 2022-02-12 DIAGNOSIS — M5001 Cervical disc disorder with myelopathy,  high cervical region: Secondary | ICD-10-CM | POA: Diagnosis not present

## 2022-02-12 DIAGNOSIS — Z981 Arthrodesis status: Secondary | ICD-10-CM | POA: Diagnosis not present

## 2022-02-12 DIAGNOSIS — R1310 Dysphagia, unspecified: Secondary | ICD-10-CM | POA: Diagnosis not present

## 2022-02-12 DIAGNOSIS — I129 Hypertensive chronic kidney disease with stage 1 through stage 4 chronic kidney disease, or unspecified chronic kidney disease: Secondary | ICD-10-CM | POA: Diagnosis not present

## 2022-02-12 DIAGNOSIS — M25552 Pain in left hip: Secondary | ICD-10-CM | POA: Diagnosis not present

## 2022-02-12 DIAGNOSIS — Z7982 Long term (current) use of aspirin: Secondary | ICD-10-CM | POA: Diagnosis not present

## 2022-02-12 DIAGNOSIS — E1122 Type 2 diabetes mellitus with diabetic chronic kidney disease: Secondary | ICD-10-CM | POA: Diagnosis not present

## 2022-02-12 DIAGNOSIS — W010XXA Fall on same level from slipping, tripping and stumbling without subsequent striking against object, initial encounter: Secondary | ICD-10-CM | POA: Diagnosis not present

## 2022-02-12 DIAGNOSIS — S72002A Fracture of unspecified part of neck of left femur, initial encounter for closed fracture: Secondary | ICD-10-CM | POA: Diagnosis not present

## 2022-02-12 DIAGNOSIS — E7800 Pure hypercholesterolemia, unspecified: Secondary | ICD-10-CM | POA: Diagnosis not present

## 2022-02-12 DIAGNOSIS — M069 Rheumatoid arthritis, unspecified: Secondary | ICD-10-CM | POA: Diagnosis not present

## 2022-02-12 DIAGNOSIS — M25562 Pain in left knee: Secondary | ICD-10-CM | POA: Diagnosis not present

## 2022-02-12 DIAGNOSIS — Z7952 Long term (current) use of systemic steroids: Secondary | ICD-10-CM | POA: Diagnosis not present

## 2022-02-12 DIAGNOSIS — I10 Essential (primary) hypertension: Secondary | ICD-10-CM | POA: Diagnosis not present

## 2022-02-12 DIAGNOSIS — N1831 Chronic kidney disease, stage 3a: Secondary | ICD-10-CM | POA: Diagnosis not present

## 2022-02-12 DIAGNOSIS — M5136 Other intervertebral disc degeneration, lumbar region: Secondary | ICD-10-CM | POA: Diagnosis not present

## 2022-02-12 DIAGNOSIS — M48061 Spinal stenosis, lumbar region without neurogenic claudication: Secondary | ICD-10-CM | POA: Diagnosis not present

## 2022-02-12 DIAGNOSIS — E1169 Type 2 diabetes mellitus with other specified complication: Secondary | ICD-10-CM | POA: Diagnosis not present

## 2022-02-12 DIAGNOSIS — E039 Hypothyroidism, unspecified: Secondary | ICD-10-CM | POA: Diagnosis not present

## 2022-02-12 DIAGNOSIS — G8252 Quadriplegia, C1-C4 incomplete: Secondary | ICD-10-CM | POA: Diagnosis not present

## 2022-02-12 DIAGNOSIS — M79662 Pain in left lower leg: Secondary | ICD-10-CM | POA: Diagnosis not present

## 2022-02-18 ENCOUNTER — Other Ambulatory Visit: Payer: Self-pay | Admitting: Urology

## 2022-02-18 DIAGNOSIS — C642 Malignant neoplasm of left kidney, except renal pelvis: Secondary | ICD-10-CM

## 2022-02-21 DIAGNOSIS — Z7952 Long term (current) use of systemic steroids: Secondary | ICD-10-CM | POA: Diagnosis not present

## 2022-02-21 DIAGNOSIS — E1169 Type 2 diabetes mellitus with other specified complication: Secondary | ICD-10-CM | POA: Diagnosis not present

## 2022-02-21 DIAGNOSIS — E039 Hypothyroidism, unspecified: Secondary | ICD-10-CM | POA: Diagnosis not present

## 2022-02-21 DIAGNOSIS — Z7982 Long term (current) use of aspirin: Secondary | ICD-10-CM | POA: Diagnosis not present

## 2022-02-21 DIAGNOSIS — M5001 Cervical disc disorder with myelopathy,  high cervical region: Secondary | ICD-10-CM | POA: Diagnosis not present

## 2022-02-21 DIAGNOSIS — G8252 Quadriplegia, C1-C4 incomplete: Secondary | ICD-10-CM | POA: Diagnosis not present

## 2022-02-21 DIAGNOSIS — R1310 Dysphagia, unspecified: Secondary | ICD-10-CM | POA: Diagnosis not present

## 2022-02-21 DIAGNOSIS — I129 Hypertensive chronic kidney disease with stage 1 through stage 4 chronic kidney disease, or unspecified chronic kidney disease: Secondary | ICD-10-CM | POA: Diagnosis not present

## 2022-02-21 DIAGNOSIS — M5136 Other intervertebral disc degeneration, lumbar region: Secondary | ICD-10-CM | POA: Diagnosis not present

## 2022-02-21 DIAGNOSIS — Z981 Arthrodesis status: Secondary | ICD-10-CM | POA: Diagnosis not present

## 2022-02-21 DIAGNOSIS — E1122 Type 2 diabetes mellitus with diabetic chronic kidney disease: Secondary | ICD-10-CM | POA: Diagnosis not present

## 2022-02-21 DIAGNOSIS — M48061 Spinal stenosis, lumbar region without neurogenic claudication: Secondary | ICD-10-CM | POA: Diagnosis not present

## 2022-02-21 DIAGNOSIS — N1831 Chronic kidney disease, stage 3a: Secondary | ICD-10-CM | POA: Diagnosis not present

## 2022-02-21 DIAGNOSIS — M069 Rheumatoid arthritis, unspecified: Secondary | ICD-10-CM | POA: Diagnosis not present

## 2022-02-21 DIAGNOSIS — E7800 Pure hypercholesterolemia, unspecified: Secondary | ICD-10-CM | POA: Diagnosis not present

## 2022-02-24 DIAGNOSIS — I129 Hypertensive chronic kidney disease with stage 1 through stage 4 chronic kidney disease, or unspecified chronic kidney disease: Secondary | ICD-10-CM | POA: Diagnosis not present

## 2022-02-24 DIAGNOSIS — R1310 Dysphagia, unspecified: Secondary | ICD-10-CM | POA: Diagnosis not present

## 2022-02-24 DIAGNOSIS — N1831 Chronic kidney disease, stage 3a: Secondary | ICD-10-CM | POA: Diagnosis not present

## 2022-02-24 DIAGNOSIS — Z981 Arthrodesis status: Secondary | ICD-10-CM | POA: Diagnosis not present

## 2022-02-24 DIAGNOSIS — M48061 Spinal stenosis, lumbar region without neurogenic claudication: Secondary | ICD-10-CM | POA: Diagnosis not present

## 2022-02-24 DIAGNOSIS — E039 Hypothyroidism, unspecified: Secondary | ICD-10-CM | POA: Diagnosis not present

## 2022-02-24 DIAGNOSIS — G8252 Quadriplegia, C1-C4 incomplete: Secondary | ICD-10-CM | POA: Diagnosis not present

## 2022-02-24 DIAGNOSIS — Z7982 Long term (current) use of aspirin: Secondary | ICD-10-CM | POA: Diagnosis not present

## 2022-02-24 DIAGNOSIS — E1169 Type 2 diabetes mellitus with other specified complication: Secondary | ICD-10-CM | POA: Diagnosis not present

## 2022-02-24 DIAGNOSIS — E7800 Pure hypercholesterolemia, unspecified: Secondary | ICD-10-CM | POA: Diagnosis not present

## 2022-02-24 DIAGNOSIS — M5001 Cervical disc disorder with myelopathy,  high cervical region: Secondary | ICD-10-CM | POA: Diagnosis not present

## 2022-02-24 DIAGNOSIS — Z7952 Long term (current) use of systemic steroids: Secondary | ICD-10-CM | POA: Diagnosis not present

## 2022-02-24 DIAGNOSIS — E1122 Type 2 diabetes mellitus with diabetic chronic kidney disease: Secondary | ICD-10-CM | POA: Diagnosis not present

## 2022-02-24 DIAGNOSIS — M5136 Other intervertebral disc degeneration, lumbar region: Secondary | ICD-10-CM | POA: Diagnosis not present

## 2022-02-24 DIAGNOSIS — M069 Rheumatoid arthritis, unspecified: Secondary | ICD-10-CM | POA: Diagnosis not present

## 2022-02-28 DIAGNOSIS — Z85528 Personal history of other malignant neoplasm of kidney: Secondary | ICD-10-CM | POA: Diagnosis not present

## 2022-02-28 DIAGNOSIS — G3184 Mild cognitive impairment, so stated: Secondary | ICD-10-CM | POA: Diagnosis not present

## 2022-02-28 DIAGNOSIS — G4700 Insomnia, unspecified: Secondary | ICD-10-CM | POA: Diagnosis not present

## 2022-02-28 DIAGNOSIS — N1831 Chronic kidney disease, stage 3a: Secondary | ICD-10-CM | POA: Diagnosis not present

## 2022-02-28 DIAGNOSIS — Z981 Arthrodesis status: Secondary | ICD-10-CM | POA: Diagnosis not present

## 2022-02-28 DIAGNOSIS — M5136 Other intervertebral disc degeneration, lumbar region: Secondary | ICD-10-CM | POA: Diagnosis not present

## 2022-02-28 DIAGNOSIS — E119 Type 2 diabetes mellitus without complications: Secondary | ICD-10-CM | POA: Diagnosis not present

## 2022-02-28 DIAGNOSIS — I129 Hypertensive chronic kidney disease with stage 1 through stage 4 chronic kidney disease, or unspecified chronic kidney disease: Secondary | ICD-10-CM | POA: Diagnosis not present

## 2022-02-28 DIAGNOSIS — M0510 Rheumatoid lung disease with rheumatoid arthritis of unspecified site: Secondary | ICD-10-CM | POA: Diagnosis not present

## 2022-02-28 DIAGNOSIS — M5020 Other cervical disc displacement, unspecified cervical region: Secondary | ICD-10-CM | POA: Diagnosis not present

## 2022-02-28 DIAGNOSIS — Z7982 Long term (current) use of aspirin: Secondary | ICD-10-CM | POA: Diagnosis not present

## 2022-02-28 DIAGNOSIS — M48061 Spinal stenosis, lumbar region without neurogenic claudication: Secondary | ICD-10-CM | POA: Diagnosis not present

## 2022-02-28 DIAGNOSIS — E039 Hypothyroidism, unspecified: Secondary | ICD-10-CM | POA: Diagnosis not present

## 2022-02-28 DIAGNOSIS — K649 Unspecified hemorrhoids: Secondary | ICD-10-CM | POA: Diagnosis not present

## 2022-02-28 DIAGNOSIS — M5126 Other intervertebral disc displacement, lumbar region: Secondary | ICD-10-CM | POA: Diagnosis not present

## 2022-02-28 DIAGNOSIS — G8252 Quadriplegia, C1-C4 incomplete: Secondary | ICD-10-CM | POA: Diagnosis not present

## 2022-02-28 DIAGNOSIS — Z7952 Long term (current) use of systemic steroids: Secondary | ICD-10-CM | POA: Diagnosis not present

## 2022-02-28 DIAGNOSIS — E7800 Pure hypercholesterolemia, unspecified: Secondary | ICD-10-CM | POA: Diagnosis not present

## 2022-02-28 DIAGNOSIS — R1310 Dysphagia, unspecified: Secondary | ICD-10-CM | POA: Diagnosis not present

## 2022-02-28 DIAGNOSIS — M5001 Cervical disc disorder with myelopathy,  high cervical region: Secondary | ICD-10-CM | POA: Diagnosis not present

## 2022-03-04 DIAGNOSIS — Z7982 Long term (current) use of aspirin: Secondary | ICD-10-CM | POA: Diagnosis not present

## 2022-03-04 DIAGNOSIS — M48061 Spinal stenosis, lumbar region without neurogenic claudication: Secondary | ICD-10-CM | POA: Diagnosis not present

## 2022-03-04 DIAGNOSIS — G4700 Insomnia, unspecified: Secondary | ICD-10-CM | POA: Diagnosis not present

## 2022-03-04 DIAGNOSIS — M0510 Rheumatoid lung disease with rheumatoid arthritis of unspecified site: Secondary | ICD-10-CM | POA: Diagnosis not present

## 2022-03-04 DIAGNOSIS — Z981 Arthrodesis status: Secondary | ICD-10-CM | POA: Diagnosis not present

## 2022-03-04 DIAGNOSIS — E119 Type 2 diabetes mellitus without complications: Secondary | ICD-10-CM | POA: Diagnosis not present

## 2022-03-04 DIAGNOSIS — M5136 Other intervertebral disc degeneration, lumbar region: Secondary | ICD-10-CM | POA: Diagnosis not present

## 2022-03-04 DIAGNOSIS — Z7952 Long term (current) use of systemic steroids: Secondary | ICD-10-CM | POA: Diagnosis not present

## 2022-03-04 DIAGNOSIS — M5126 Other intervertebral disc displacement, lumbar region: Secondary | ICD-10-CM | POA: Diagnosis not present

## 2022-03-04 DIAGNOSIS — E039 Hypothyroidism, unspecified: Secondary | ICD-10-CM | POA: Diagnosis not present

## 2022-03-04 DIAGNOSIS — N1831 Chronic kidney disease, stage 3a: Secondary | ICD-10-CM | POA: Diagnosis not present

## 2022-03-04 DIAGNOSIS — E7800 Pure hypercholesterolemia, unspecified: Secondary | ICD-10-CM | POA: Diagnosis not present

## 2022-03-04 DIAGNOSIS — K649 Unspecified hemorrhoids: Secondary | ICD-10-CM | POA: Diagnosis not present

## 2022-03-04 DIAGNOSIS — G3184 Mild cognitive impairment, so stated: Secondary | ICD-10-CM | POA: Diagnosis not present

## 2022-03-04 DIAGNOSIS — I129 Hypertensive chronic kidney disease with stage 1 through stage 4 chronic kidney disease, or unspecified chronic kidney disease: Secondary | ICD-10-CM | POA: Diagnosis not present

## 2022-03-04 DIAGNOSIS — R1310 Dysphagia, unspecified: Secondary | ICD-10-CM | POA: Diagnosis not present

## 2022-03-04 DIAGNOSIS — M5020 Other cervical disc displacement, unspecified cervical region: Secondary | ICD-10-CM | POA: Diagnosis not present

## 2022-03-04 DIAGNOSIS — Z85528 Personal history of other malignant neoplasm of kidney: Secondary | ICD-10-CM | POA: Diagnosis not present

## 2022-03-04 DIAGNOSIS — M5001 Cervical disc disorder with myelopathy,  high cervical region: Secondary | ICD-10-CM | POA: Diagnosis not present

## 2022-03-04 DIAGNOSIS — G8252 Quadriplegia, C1-C4 incomplete: Secondary | ICD-10-CM | POA: Diagnosis not present

## 2022-03-05 DIAGNOSIS — K5900 Constipation, unspecified: Secondary | ICD-10-CM | POA: Diagnosis not present

## 2022-03-05 DIAGNOSIS — M0580 Other rheumatoid arthritis with rheumatoid factor of unspecified site: Secondary | ICD-10-CM | POA: Diagnosis not present

## 2022-03-05 DIAGNOSIS — Z79899 Other long term (current) drug therapy: Secondary | ICD-10-CM | POA: Diagnosis not present

## 2022-03-06 DIAGNOSIS — K649 Unspecified hemorrhoids: Secondary | ICD-10-CM | POA: Diagnosis not present

## 2022-03-06 DIAGNOSIS — M5020 Other cervical disc displacement, unspecified cervical region: Secondary | ICD-10-CM | POA: Diagnosis not present

## 2022-03-06 DIAGNOSIS — G4700 Insomnia, unspecified: Secondary | ICD-10-CM | POA: Diagnosis not present

## 2022-03-06 DIAGNOSIS — Z7952 Long term (current) use of systemic steroids: Secondary | ICD-10-CM | POA: Diagnosis not present

## 2022-03-06 DIAGNOSIS — Z85528 Personal history of other malignant neoplasm of kidney: Secondary | ICD-10-CM | POA: Diagnosis not present

## 2022-03-06 DIAGNOSIS — G8252 Quadriplegia, C1-C4 incomplete: Secondary | ICD-10-CM | POA: Diagnosis not present

## 2022-03-06 DIAGNOSIS — R1310 Dysphagia, unspecified: Secondary | ICD-10-CM | POA: Diagnosis not present

## 2022-03-06 DIAGNOSIS — I129 Hypertensive chronic kidney disease with stage 1 through stage 4 chronic kidney disease, or unspecified chronic kidney disease: Secondary | ICD-10-CM | POA: Diagnosis not present

## 2022-03-06 DIAGNOSIS — E119 Type 2 diabetes mellitus without complications: Secondary | ICD-10-CM | POA: Diagnosis not present

## 2022-03-06 DIAGNOSIS — E7800 Pure hypercholesterolemia, unspecified: Secondary | ICD-10-CM | POA: Diagnosis not present

## 2022-03-06 DIAGNOSIS — Z981 Arthrodesis status: Secondary | ICD-10-CM | POA: Diagnosis not present

## 2022-03-06 DIAGNOSIS — Z7982 Long term (current) use of aspirin: Secondary | ICD-10-CM | POA: Diagnosis not present

## 2022-03-06 DIAGNOSIS — N1831 Chronic kidney disease, stage 3a: Secondary | ICD-10-CM | POA: Diagnosis not present

## 2022-03-06 DIAGNOSIS — M5126 Other intervertebral disc displacement, lumbar region: Secondary | ICD-10-CM | POA: Diagnosis not present

## 2022-03-06 DIAGNOSIS — E039 Hypothyroidism, unspecified: Secondary | ICD-10-CM | POA: Diagnosis not present

## 2022-03-06 DIAGNOSIS — M0510 Rheumatoid lung disease with rheumatoid arthritis of unspecified site: Secondary | ICD-10-CM | POA: Diagnosis not present

## 2022-03-06 DIAGNOSIS — G3184 Mild cognitive impairment, so stated: Secondary | ICD-10-CM | POA: Diagnosis not present

## 2022-03-06 DIAGNOSIS — M5136 Other intervertebral disc degeneration, lumbar region: Secondary | ICD-10-CM | POA: Diagnosis not present

## 2022-03-06 DIAGNOSIS — M48061 Spinal stenosis, lumbar region without neurogenic claudication: Secondary | ICD-10-CM | POA: Diagnosis not present

## 2022-03-06 DIAGNOSIS — M5001 Cervical disc disorder with myelopathy,  high cervical region: Secondary | ICD-10-CM | POA: Diagnosis not present

## 2022-03-07 DIAGNOSIS — N1831 Chronic kidney disease, stage 3a: Secondary | ICD-10-CM | POA: Diagnosis not present

## 2022-03-07 DIAGNOSIS — M5020 Other cervical disc displacement, unspecified cervical region: Secondary | ICD-10-CM | POA: Diagnosis not present

## 2022-03-07 DIAGNOSIS — M5136 Other intervertebral disc degeneration, lumbar region: Secondary | ICD-10-CM | POA: Diagnosis not present

## 2022-03-07 DIAGNOSIS — E039 Hypothyroidism, unspecified: Secondary | ICD-10-CM | POA: Diagnosis not present

## 2022-03-07 DIAGNOSIS — I129 Hypertensive chronic kidney disease with stage 1 through stage 4 chronic kidney disease, or unspecified chronic kidney disease: Secondary | ICD-10-CM | POA: Diagnosis not present

## 2022-03-07 DIAGNOSIS — Z7982 Long term (current) use of aspirin: Secondary | ICD-10-CM | POA: Diagnosis not present

## 2022-03-07 DIAGNOSIS — R1310 Dysphagia, unspecified: Secondary | ICD-10-CM | POA: Diagnosis not present

## 2022-03-07 DIAGNOSIS — M5126 Other intervertebral disc displacement, lumbar region: Secondary | ICD-10-CM | POA: Diagnosis not present

## 2022-03-07 DIAGNOSIS — E119 Type 2 diabetes mellitus without complications: Secondary | ICD-10-CM | POA: Diagnosis not present

## 2022-03-07 DIAGNOSIS — M48061 Spinal stenosis, lumbar region without neurogenic claudication: Secondary | ICD-10-CM | POA: Diagnosis not present

## 2022-03-07 DIAGNOSIS — G4700 Insomnia, unspecified: Secondary | ICD-10-CM | POA: Diagnosis not present

## 2022-03-07 DIAGNOSIS — Z7952 Long term (current) use of systemic steroids: Secondary | ICD-10-CM | POA: Diagnosis not present

## 2022-03-07 DIAGNOSIS — G8252 Quadriplegia, C1-C4 incomplete: Secondary | ICD-10-CM | POA: Diagnosis not present

## 2022-03-07 DIAGNOSIS — G3184 Mild cognitive impairment, so stated: Secondary | ICD-10-CM | POA: Diagnosis not present

## 2022-03-07 DIAGNOSIS — Z85528 Personal history of other malignant neoplasm of kidney: Secondary | ICD-10-CM | POA: Diagnosis not present

## 2022-03-07 DIAGNOSIS — Z981 Arthrodesis status: Secondary | ICD-10-CM | POA: Diagnosis not present

## 2022-03-07 DIAGNOSIS — K649 Unspecified hemorrhoids: Secondary | ICD-10-CM | POA: Diagnosis not present

## 2022-03-07 DIAGNOSIS — M0510 Rheumatoid lung disease with rheumatoid arthritis of unspecified site: Secondary | ICD-10-CM | POA: Diagnosis not present

## 2022-03-07 DIAGNOSIS — M5001 Cervical disc disorder with myelopathy,  high cervical region: Secondary | ICD-10-CM | POA: Diagnosis not present

## 2022-03-07 DIAGNOSIS — E7800 Pure hypercholesterolemia, unspecified: Secondary | ICD-10-CM | POA: Diagnosis not present

## 2022-03-10 DIAGNOSIS — Z7952 Long term (current) use of systemic steroids: Secondary | ICD-10-CM | POA: Diagnosis not present

## 2022-03-10 DIAGNOSIS — I129 Hypertensive chronic kidney disease with stage 1 through stage 4 chronic kidney disease, or unspecified chronic kidney disease: Secondary | ICD-10-CM | POA: Diagnosis not present

## 2022-03-10 DIAGNOSIS — N1831 Chronic kidney disease, stage 3a: Secondary | ICD-10-CM | POA: Diagnosis not present

## 2022-03-10 DIAGNOSIS — G8252 Quadriplegia, C1-C4 incomplete: Secondary | ICD-10-CM | POA: Diagnosis not present

## 2022-03-10 DIAGNOSIS — Z7982 Long term (current) use of aspirin: Secondary | ICD-10-CM | POA: Diagnosis not present

## 2022-03-10 DIAGNOSIS — M5126 Other intervertebral disc displacement, lumbar region: Secondary | ICD-10-CM | POA: Diagnosis not present

## 2022-03-10 DIAGNOSIS — E7800 Pure hypercholesterolemia, unspecified: Secondary | ICD-10-CM | POA: Diagnosis not present

## 2022-03-10 DIAGNOSIS — M48061 Spinal stenosis, lumbar region without neurogenic claudication: Secondary | ICD-10-CM | POA: Diagnosis not present

## 2022-03-10 DIAGNOSIS — E119 Type 2 diabetes mellitus without complications: Secondary | ICD-10-CM | POA: Diagnosis not present

## 2022-03-10 DIAGNOSIS — M5136 Other intervertebral disc degeneration, lumbar region: Secondary | ICD-10-CM | POA: Diagnosis not present

## 2022-03-10 DIAGNOSIS — Z85528 Personal history of other malignant neoplasm of kidney: Secondary | ICD-10-CM | POA: Diagnosis not present

## 2022-03-10 DIAGNOSIS — K649 Unspecified hemorrhoids: Secondary | ICD-10-CM | POA: Diagnosis not present

## 2022-03-10 DIAGNOSIS — G4700 Insomnia, unspecified: Secondary | ICD-10-CM | POA: Diagnosis not present

## 2022-03-10 DIAGNOSIS — Z981 Arthrodesis status: Secondary | ICD-10-CM | POA: Diagnosis not present

## 2022-03-10 DIAGNOSIS — G3184 Mild cognitive impairment, so stated: Secondary | ICD-10-CM | POA: Diagnosis not present

## 2022-03-10 DIAGNOSIS — M5001 Cervical disc disorder with myelopathy,  high cervical region: Secondary | ICD-10-CM | POA: Diagnosis not present

## 2022-03-10 DIAGNOSIS — M0510 Rheumatoid lung disease with rheumatoid arthritis of unspecified site: Secondary | ICD-10-CM | POA: Diagnosis not present

## 2022-03-10 DIAGNOSIS — M5020 Other cervical disc displacement, unspecified cervical region: Secondary | ICD-10-CM | POA: Diagnosis not present

## 2022-03-10 DIAGNOSIS — R1310 Dysphagia, unspecified: Secondary | ICD-10-CM | POA: Diagnosis not present

## 2022-03-10 DIAGNOSIS — E039 Hypothyroidism, unspecified: Secondary | ICD-10-CM | POA: Diagnosis not present

## 2022-03-13 ENCOUNTER — Ambulatory Visit: Payer: Self-pay

## 2022-03-13 DIAGNOSIS — U071 COVID-19: Secondary | ICD-10-CM | POA: Diagnosis not present

## 2022-03-13 DIAGNOSIS — R059 Cough, unspecified: Secondary | ICD-10-CM | POA: Diagnosis not present

## 2022-03-13 DIAGNOSIS — R918 Other nonspecific abnormal finding of lung field: Secondary | ICD-10-CM | POA: Diagnosis not present

## 2022-03-13 NOTE — Patient Outreach (Signed)
  Care Coordination   03/13/2022 Name: [name] MRN: [id number] DOB: 1946/03/17   Care Coordination Outreach Attempts:  An unsuccessful telephone outreach was attempted today to offer the patient information about available care coordination services as a benefit of their health plan.   Follow Up Plan:  Additional outreach attempts will be made to offer the patient care coordination information and services.   Encounter Outcome:  No Answer  Care Coordination Interventions Activated:  No   Care Coordination Interventions:  No, not indicated    [facility] [phone]

## 2022-03-14 DIAGNOSIS — M5126 Other intervertebral disc displacement, lumbar region: Secondary | ICD-10-CM | POA: Diagnosis not present

## 2022-03-14 DIAGNOSIS — N1831 Chronic kidney disease, stage 3a: Secondary | ICD-10-CM | POA: Diagnosis not present

## 2022-03-14 DIAGNOSIS — G3184 Mild cognitive impairment, so stated: Secondary | ICD-10-CM | POA: Diagnosis not present

## 2022-03-14 DIAGNOSIS — K649 Unspecified hemorrhoids: Secondary | ICD-10-CM | POA: Diagnosis not present

## 2022-03-14 DIAGNOSIS — I129 Hypertensive chronic kidney disease with stage 1 through stage 4 chronic kidney disease, or unspecified chronic kidney disease: Secondary | ICD-10-CM | POA: Diagnosis not present

## 2022-03-14 DIAGNOSIS — E119 Type 2 diabetes mellitus without complications: Secondary | ICD-10-CM | POA: Diagnosis not present

## 2022-03-14 DIAGNOSIS — E039 Hypothyroidism, unspecified: Secondary | ICD-10-CM | POA: Diagnosis not present

## 2022-03-14 DIAGNOSIS — Z7982 Long term (current) use of aspirin: Secondary | ICD-10-CM | POA: Diagnosis not present

## 2022-03-14 DIAGNOSIS — G8252 Quadriplegia, C1-C4 incomplete: Secondary | ICD-10-CM | POA: Diagnosis not present

## 2022-03-14 DIAGNOSIS — Z981 Arthrodesis status: Secondary | ICD-10-CM | POA: Diagnosis not present

## 2022-03-14 DIAGNOSIS — M0510 Rheumatoid lung disease with rheumatoid arthritis of unspecified site: Secondary | ICD-10-CM | POA: Diagnosis not present

## 2022-03-14 DIAGNOSIS — R1310 Dysphagia, unspecified: Secondary | ICD-10-CM | POA: Diagnosis not present

## 2022-03-14 DIAGNOSIS — M5020 Other cervical disc displacement, unspecified cervical region: Secondary | ICD-10-CM | POA: Diagnosis not present

## 2022-03-14 DIAGNOSIS — Z85528 Personal history of other malignant neoplasm of kidney: Secondary | ICD-10-CM | POA: Diagnosis not present

## 2022-03-14 DIAGNOSIS — M48061 Spinal stenosis, lumbar region without neurogenic claudication: Secondary | ICD-10-CM | POA: Diagnosis not present

## 2022-03-14 DIAGNOSIS — M5001 Cervical disc disorder with myelopathy,  high cervical region: Secondary | ICD-10-CM | POA: Diagnosis not present

## 2022-03-14 DIAGNOSIS — E7800 Pure hypercholesterolemia, unspecified: Secondary | ICD-10-CM | POA: Diagnosis not present

## 2022-03-14 DIAGNOSIS — G4700 Insomnia, unspecified: Secondary | ICD-10-CM | POA: Diagnosis not present

## 2022-03-14 DIAGNOSIS — Z7952 Long term (current) use of systemic steroids: Secondary | ICD-10-CM | POA: Diagnosis not present

## 2022-03-14 DIAGNOSIS — M5136 Other intervertebral disc degeneration, lumbar region: Secondary | ICD-10-CM | POA: Diagnosis not present

## 2022-03-18 DIAGNOSIS — R1310 Dysphagia, unspecified: Secondary | ICD-10-CM | POA: Diagnosis not present

## 2022-03-18 DIAGNOSIS — I129 Hypertensive chronic kidney disease with stage 1 through stage 4 chronic kidney disease, or unspecified chronic kidney disease: Secondary | ICD-10-CM | POA: Diagnosis not present

## 2022-03-18 DIAGNOSIS — M0510 Rheumatoid lung disease with rheumatoid arthritis of unspecified site: Secondary | ICD-10-CM | POA: Diagnosis not present

## 2022-03-18 DIAGNOSIS — Z7952 Long term (current) use of systemic steroids: Secondary | ICD-10-CM | POA: Diagnosis not present

## 2022-03-18 DIAGNOSIS — M5136 Other intervertebral disc degeneration, lumbar region: Secondary | ICD-10-CM | POA: Diagnosis not present

## 2022-03-18 DIAGNOSIS — Z7982 Long term (current) use of aspirin: Secondary | ICD-10-CM | POA: Diagnosis not present

## 2022-03-18 DIAGNOSIS — G3184 Mild cognitive impairment, so stated: Secondary | ICD-10-CM | POA: Diagnosis not present

## 2022-03-18 DIAGNOSIS — K649 Unspecified hemorrhoids: Secondary | ICD-10-CM | POA: Diagnosis not present

## 2022-03-18 DIAGNOSIS — Z981 Arthrodesis status: Secondary | ICD-10-CM | POA: Diagnosis not present

## 2022-03-18 DIAGNOSIS — N1831 Chronic kidney disease, stage 3a: Secondary | ICD-10-CM | POA: Diagnosis not present

## 2022-03-18 DIAGNOSIS — M5001 Cervical disc disorder with myelopathy,  high cervical region: Secondary | ICD-10-CM | POA: Diagnosis not present

## 2022-03-18 DIAGNOSIS — M48061 Spinal stenosis, lumbar region without neurogenic claudication: Secondary | ICD-10-CM | POA: Diagnosis not present

## 2022-03-18 DIAGNOSIS — G4700 Insomnia, unspecified: Secondary | ICD-10-CM | POA: Diagnosis not present

## 2022-03-18 DIAGNOSIS — M5020 Other cervical disc displacement, unspecified cervical region: Secondary | ICD-10-CM | POA: Diagnosis not present

## 2022-03-18 DIAGNOSIS — E119 Type 2 diabetes mellitus without complications: Secondary | ICD-10-CM | POA: Diagnosis not present

## 2022-03-18 DIAGNOSIS — E7800 Pure hypercholesterolemia, unspecified: Secondary | ICD-10-CM | POA: Diagnosis not present

## 2022-03-18 DIAGNOSIS — E039 Hypothyroidism, unspecified: Secondary | ICD-10-CM | POA: Diagnosis not present

## 2022-03-18 DIAGNOSIS — Z85528 Personal history of other malignant neoplasm of kidney: Secondary | ICD-10-CM | POA: Diagnosis not present

## 2022-03-18 DIAGNOSIS — G8252 Quadriplegia, C1-C4 incomplete: Secondary | ICD-10-CM | POA: Diagnosis not present

## 2022-03-18 DIAGNOSIS — M5126 Other intervertebral disc displacement, lumbar region: Secondary | ICD-10-CM | POA: Diagnosis not present

## 2022-03-19 DIAGNOSIS — I10 Essential (primary) hypertension: Secondary | ICD-10-CM | POA: Diagnosis not present

## 2022-03-19 DIAGNOSIS — M0580 Other rheumatoid arthritis with rheumatoid factor of unspecified site: Secondary | ICD-10-CM | POA: Diagnosis not present

## 2022-03-26 DIAGNOSIS — G3184 Mild cognitive impairment, so stated: Secondary | ICD-10-CM | POA: Diagnosis not present

## 2022-03-26 DIAGNOSIS — M5020 Other cervical disc displacement, unspecified cervical region: Secondary | ICD-10-CM | POA: Diagnosis not present

## 2022-03-26 DIAGNOSIS — Z7982 Long term (current) use of aspirin: Secondary | ICD-10-CM | POA: Diagnosis not present

## 2022-03-26 DIAGNOSIS — E039 Hypothyroidism, unspecified: Secondary | ICD-10-CM | POA: Diagnosis not present

## 2022-03-26 DIAGNOSIS — G8252 Quadriplegia, C1-C4 incomplete: Secondary | ICD-10-CM | POA: Diagnosis not present

## 2022-03-26 DIAGNOSIS — Z981 Arthrodesis status: Secondary | ICD-10-CM | POA: Diagnosis not present

## 2022-03-26 DIAGNOSIS — K649 Unspecified hemorrhoids: Secondary | ICD-10-CM | POA: Diagnosis not present

## 2022-03-26 DIAGNOSIS — N1831 Chronic kidney disease, stage 3a: Secondary | ICD-10-CM | POA: Diagnosis not present

## 2022-03-26 DIAGNOSIS — M5126 Other intervertebral disc displacement, lumbar region: Secondary | ICD-10-CM | POA: Diagnosis not present

## 2022-03-26 DIAGNOSIS — R1310 Dysphagia, unspecified: Secondary | ICD-10-CM | POA: Diagnosis not present

## 2022-03-26 DIAGNOSIS — M0510 Rheumatoid lung disease with rheumatoid arthritis of unspecified site: Secondary | ICD-10-CM | POA: Diagnosis not present

## 2022-03-26 DIAGNOSIS — G4700 Insomnia, unspecified: Secondary | ICD-10-CM | POA: Diagnosis not present

## 2022-03-26 DIAGNOSIS — Z85528 Personal history of other malignant neoplasm of kidney: Secondary | ICD-10-CM | POA: Diagnosis not present

## 2022-03-26 DIAGNOSIS — I129 Hypertensive chronic kidney disease with stage 1 through stage 4 chronic kidney disease, or unspecified chronic kidney disease: Secondary | ICD-10-CM | POA: Diagnosis not present

## 2022-03-26 DIAGNOSIS — E119 Type 2 diabetes mellitus without complications: Secondary | ICD-10-CM | POA: Diagnosis not present

## 2022-03-26 DIAGNOSIS — Z7952 Long term (current) use of systemic steroids: Secondary | ICD-10-CM | POA: Diagnosis not present

## 2022-03-26 DIAGNOSIS — E7800 Pure hypercholesterolemia, unspecified: Secondary | ICD-10-CM | POA: Diagnosis not present

## 2022-03-26 DIAGNOSIS — M5001 Cervical disc disorder with myelopathy,  high cervical region: Secondary | ICD-10-CM | POA: Diagnosis not present

## 2022-03-26 DIAGNOSIS — M5136 Other intervertebral disc degeneration, lumbar region: Secondary | ICD-10-CM | POA: Diagnosis not present

## 2022-03-26 DIAGNOSIS — M48061 Spinal stenosis, lumbar region without neurogenic claudication: Secondary | ICD-10-CM | POA: Diagnosis not present

## 2022-03-28 DIAGNOSIS — M0510 Rheumatoid lung disease with rheumatoid arthritis of unspecified site: Secondary | ICD-10-CM | POA: Diagnosis not present

## 2022-03-28 DIAGNOSIS — E039 Hypothyroidism, unspecified: Secondary | ICD-10-CM | POA: Diagnosis not present

## 2022-03-28 DIAGNOSIS — M5020 Other cervical disc displacement, unspecified cervical region: Secondary | ICD-10-CM | POA: Diagnosis not present

## 2022-03-28 DIAGNOSIS — M5136 Other intervertebral disc degeneration, lumbar region: Secondary | ICD-10-CM | POA: Diagnosis not present

## 2022-03-28 DIAGNOSIS — E7800 Pure hypercholesterolemia, unspecified: Secondary | ICD-10-CM | POA: Diagnosis not present

## 2022-03-28 DIAGNOSIS — R1310 Dysphagia, unspecified: Secondary | ICD-10-CM | POA: Diagnosis not present

## 2022-03-28 DIAGNOSIS — E119 Type 2 diabetes mellitus without complications: Secondary | ICD-10-CM | POA: Diagnosis not present

## 2022-03-28 DIAGNOSIS — Z7982 Long term (current) use of aspirin: Secondary | ICD-10-CM | POA: Diagnosis not present

## 2022-03-28 DIAGNOSIS — Z7952 Long term (current) use of systemic steroids: Secondary | ICD-10-CM | POA: Diagnosis not present

## 2022-03-28 DIAGNOSIS — M48061 Spinal stenosis, lumbar region without neurogenic claudication: Secondary | ICD-10-CM | POA: Diagnosis not present

## 2022-03-28 DIAGNOSIS — G3184 Mild cognitive impairment, so stated: Secondary | ICD-10-CM | POA: Diagnosis not present

## 2022-03-28 DIAGNOSIS — Z981 Arthrodesis status: Secondary | ICD-10-CM | POA: Diagnosis not present

## 2022-03-28 DIAGNOSIS — I129 Hypertensive chronic kidney disease with stage 1 through stage 4 chronic kidney disease, or unspecified chronic kidney disease: Secondary | ICD-10-CM | POA: Diagnosis not present

## 2022-03-28 DIAGNOSIS — G8252 Quadriplegia, C1-C4 incomplete: Secondary | ICD-10-CM | POA: Diagnosis not present

## 2022-03-28 DIAGNOSIS — K649 Unspecified hemorrhoids: Secondary | ICD-10-CM | POA: Diagnosis not present

## 2022-03-28 DIAGNOSIS — G4700 Insomnia, unspecified: Secondary | ICD-10-CM | POA: Diagnosis not present

## 2022-03-28 DIAGNOSIS — N1831 Chronic kidney disease, stage 3a: Secondary | ICD-10-CM | POA: Diagnosis not present

## 2022-03-28 DIAGNOSIS — Z85528 Personal history of other malignant neoplasm of kidney: Secondary | ICD-10-CM | POA: Diagnosis not present

## 2022-03-28 DIAGNOSIS — M5001 Cervical disc disorder with myelopathy,  high cervical region: Secondary | ICD-10-CM | POA: Diagnosis not present

## 2022-03-28 DIAGNOSIS — M5126 Other intervertebral disc displacement, lumbar region: Secondary | ICD-10-CM | POA: Diagnosis not present

## 2022-04-02 DIAGNOSIS — M0580 Other rheumatoid arthritis with rheumatoid factor of unspecified site: Secondary | ICD-10-CM | POA: Diagnosis not present

## 2022-04-02 DIAGNOSIS — Z79899 Other long term (current) drug therapy: Secondary | ICD-10-CM | POA: Diagnosis not present

## 2022-04-02 DIAGNOSIS — I10 Essential (primary) hypertension: Secondary | ICD-10-CM | POA: Diagnosis not present

## 2022-04-03 ENCOUNTER — Telehealth: Payer: Self-pay

## 2022-04-03 DIAGNOSIS — Z85528 Personal history of other malignant neoplasm of kidney: Secondary | ICD-10-CM | POA: Diagnosis not present

## 2022-04-03 DIAGNOSIS — I129 Hypertensive chronic kidney disease with stage 1 through stage 4 chronic kidney disease, or unspecified chronic kidney disease: Secondary | ICD-10-CM | POA: Diagnosis not present

## 2022-04-03 DIAGNOSIS — K649 Unspecified hemorrhoids: Secondary | ICD-10-CM | POA: Diagnosis not present

## 2022-04-03 DIAGNOSIS — G8252 Quadriplegia, C1-C4 incomplete: Secondary | ICD-10-CM | POA: Diagnosis not present

## 2022-04-03 DIAGNOSIS — M5126 Other intervertebral disc displacement, lumbar region: Secondary | ICD-10-CM | POA: Diagnosis not present

## 2022-04-03 DIAGNOSIS — Z7982 Long term (current) use of aspirin: Secondary | ICD-10-CM | POA: Diagnosis not present

## 2022-04-03 DIAGNOSIS — E039 Hypothyroidism, unspecified: Secondary | ICD-10-CM | POA: Diagnosis not present

## 2022-04-03 DIAGNOSIS — M0510 Rheumatoid lung disease with rheumatoid arthritis of unspecified site: Secondary | ICD-10-CM | POA: Diagnosis not present

## 2022-04-03 DIAGNOSIS — G3184 Mild cognitive impairment, so stated: Secondary | ICD-10-CM | POA: Diagnosis not present

## 2022-04-03 DIAGNOSIS — E119 Type 2 diabetes mellitus without complications: Secondary | ICD-10-CM | POA: Diagnosis not present

## 2022-04-03 DIAGNOSIS — N1831 Chronic kidney disease, stage 3a: Secondary | ICD-10-CM | POA: Diagnosis not present

## 2022-04-03 DIAGNOSIS — R1310 Dysphagia, unspecified: Secondary | ICD-10-CM | POA: Diagnosis not present

## 2022-04-03 DIAGNOSIS — Z981 Arthrodesis status: Secondary | ICD-10-CM | POA: Diagnosis not present

## 2022-04-03 DIAGNOSIS — E7800 Pure hypercholesterolemia, unspecified: Secondary | ICD-10-CM | POA: Diagnosis not present

## 2022-04-03 DIAGNOSIS — Z7952 Long term (current) use of systemic steroids: Secondary | ICD-10-CM | POA: Diagnosis not present

## 2022-04-03 DIAGNOSIS — M48061 Spinal stenosis, lumbar region without neurogenic claudication: Secondary | ICD-10-CM | POA: Diagnosis not present

## 2022-04-03 DIAGNOSIS — M5020 Other cervical disc displacement, unspecified cervical region: Secondary | ICD-10-CM | POA: Diagnosis not present

## 2022-04-03 DIAGNOSIS — G4700 Insomnia, unspecified: Secondary | ICD-10-CM | POA: Diagnosis not present

## 2022-04-03 DIAGNOSIS — M5136 Other intervertebral disc degeneration, lumbar region: Secondary | ICD-10-CM | POA: Diagnosis not present

## 2022-04-03 DIAGNOSIS — M5001 Cervical disc disorder with myelopathy,  high cervical region: Secondary | ICD-10-CM | POA: Diagnosis not present

## 2022-04-03 NOTE — Patient Outreach (Signed)
  Care Coordination   04/03/2022 Name: [name] MRN: [id number] DOB: Nov 18, 1945   Care Coordination Outreach Attempts:  A second unsuccessful outreach was attempted today to offer the patient with information about available care coordination services as a benefit of their health plan.     Follow Up Plan:  Additional outreach attempts will be made to offer the patient care coordination information and services.   Encounter Outcome:  No Answer  Care Coordination Interventions Activated:  No   Care Coordination Interventions:  No, not indicated    [facility] [facility] [phone]

## 2022-04-04 DIAGNOSIS — C652 Malignant neoplasm of left renal pelvis: Secondary | ICD-10-CM | POA: Diagnosis not present

## 2022-04-04 DIAGNOSIS — R8279 Other abnormal findings on microbiological examination of urine: Secondary | ICD-10-CM | POA: Diagnosis not present

## 2022-04-07 ENCOUNTER — Other Ambulatory Visit: Payer: Self-pay | Admitting: Urology

## 2022-04-07 DIAGNOSIS — C652 Malignant neoplasm of left renal pelvis: Secondary | ICD-10-CM

## 2022-04-09 DIAGNOSIS — M5001 Cervical disc disorder with myelopathy,  high cervical region: Secondary | ICD-10-CM | POA: Diagnosis not present

## 2022-04-09 DIAGNOSIS — Z981 Arthrodesis status: Secondary | ICD-10-CM | POA: Diagnosis not present

## 2022-04-09 DIAGNOSIS — G4700 Insomnia, unspecified: Secondary | ICD-10-CM | POA: Diagnosis not present

## 2022-04-09 DIAGNOSIS — Z7952 Long term (current) use of systemic steroids: Secondary | ICD-10-CM | POA: Diagnosis not present

## 2022-04-09 DIAGNOSIS — M5020 Other cervical disc displacement, unspecified cervical region: Secondary | ICD-10-CM | POA: Diagnosis not present

## 2022-04-09 DIAGNOSIS — E119 Type 2 diabetes mellitus without complications: Secondary | ICD-10-CM | POA: Diagnosis not present

## 2022-04-09 DIAGNOSIS — M0510 Rheumatoid lung disease with rheumatoid arthritis of unspecified site: Secondary | ICD-10-CM | POA: Diagnosis not present

## 2022-04-09 DIAGNOSIS — M5136 Other intervertebral disc degeneration, lumbar region: Secondary | ICD-10-CM | POA: Diagnosis not present

## 2022-04-09 DIAGNOSIS — I129 Hypertensive chronic kidney disease with stage 1 through stage 4 chronic kidney disease, or unspecified chronic kidney disease: Secondary | ICD-10-CM | POA: Diagnosis not present

## 2022-04-09 DIAGNOSIS — E7800 Pure hypercholesterolemia, unspecified: Secondary | ICD-10-CM | POA: Diagnosis not present

## 2022-04-09 DIAGNOSIS — K649 Unspecified hemorrhoids: Secondary | ICD-10-CM | POA: Diagnosis not present

## 2022-04-09 DIAGNOSIS — N1831 Chronic kidney disease, stage 3a: Secondary | ICD-10-CM | POA: Diagnosis not present

## 2022-04-09 DIAGNOSIS — Z7982 Long term (current) use of aspirin: Secondary | ICD-10-CM | POA: Diagnosis not present

## 2022-04-09 DIAGNOSIS — R1310 Dysphagia, unspecified: Secondary | ICD-10-CM | POA: Diagnosis not present

## 2022-04-09 DIAGNOSIS — G8252 Quadriplegia, C1-C4 incomplete: Secondary | ICD-10-CM | POA: Diagnosis not present

## 2022-04-09 DIAGNOSIS — M5126 Other intervertebral disc displacement, lumbar region: Secondary | ICD-10-CM | POA: Diagnosis not present

## 2022-04-09 DIAGNOSIS — G3184 Mild cognitive impairment, so stated: Secondary | ICD-10-CM | POA: Diagnosis not present

## 2022-04-09 DIAGNOSIS — E039 Hypothyroidism, unspecified: Secondary | ICD-10-CM | POA: Diagnosis not present

## 2022-04-09 DIAGNOSIS — M48061 Spinal stenosis, lumbar region without neurogenic claudication: Secondary | ICD-10-CM | POA: Diagnosis not present

## 2022-04-09 DIAGNOSIS — Z85528 Personal history of other malignant neoplasm of kidney: Secondary | ICD-10-CM | POA: Diagnosis not present

## 2022-04-15 DIAGNOSIS — R300 Dysuria: Secondary | ICD-10-CM | POA: Diagnosis not present

## 2022-04-16 DIAGNOSIS — M5001 Cervical disc disorder with myelopathy,  high cervical region: Secondary | ICD-10-CM | POA: Diagnosis not present

## 2022-04-16 DIAGNOSIS — E039 Hypothyroidism, unspecified: Secondary | ICD-10-CM | POA: Diagnosis not present

## 2022-04-16 DIAGNOSIS — K649 Unspecified hemorrhoids: Secondary | ICD-10-CM | POA: Diagnosis not present

## 2022-04-16 DIAGNOSIS — E7800 Pure hypercholesterolemia, unspecified: Secondary | ICD-10-CM | POA: Diagnosis not present

## 2022-04-16 DIAGNOSIS — M0580 Other rheumatoid arthritis with rheumatoid factor of unspecified site: Secondary | ICD-10-CM | POA: Diagnosis not present

## 2022-04-16 DIAGNOSIS — G4700 Insomnia, unspecified: Secondary | ICD-10-CM | POA: Diagnosis not present

## 2022-04-16 DIAGNOSIS — I10 Essential (primary) hypertension: Secondary | ICD-10-CM | POA: Diagnosis not present

## 2022-04-16 DIAGNOSIS — G3184 Mild cognitive impairment, so stated: Secondary | ICD-10-CM | POA: Diagnosis not present

## 2022-04-16 DIAGNOSIS — M5020 Other cervical disc displacement, unspecified cervical region: Secondary | ICD-10-CM | POA: Diagnosis not present

## 2022-04-16 DIAGNOSIS — Z7982 Long term (current) use of aspirin: Secondary | ICD-10-CM | POA: Diagnosis not present

## 2022-04-16 DIAGNOSIS — M5136 Other intervertebral disc degeneration, lumbar region: Secondary | ICD-10-CM | POA: Diagnosis not present

## 2022-04-16 DIAGNOSIS — G8252 Quadriplegia, C1-C4 incomplete: Secondary | ICD-10-CM | POA: Diagnosis not present

## 2022-04-16 DIAGNOSIS — I129 Hypertensive chronic kidney disease with stage 1 through stage 4 chronic kidney disease, or unspecified chronic kidney disease: Secondary | ICD-10-CM | POA: Diagnosis not present

## 2022-04-16 DIAGNOSIS — E119 Type 2 diabetes mellitus without complications: Secondary | ICD-10-CM | POA: Diagnosis not present

## 2022-04-16 DIAGNOSIS — M0510 Rheumatoid lung disease with rheumatoid arthritis of unspecified site: Secondary | ICD-10-CM | POA: Diagnosis not present

## 2022-04-16 DIAGNOSIS — Z7952 Long term (current) use of systemic steroids: Secondary | ICD-10-CM | POA: Diagnosis not present

## 2022-04-16 DIAGNOSIS — R1310 Dysphagia, unspecified: Secondary | ICD-10-CM | POA: Diagnosis not present

## 2022-04-16 DIAGNOSIS — Z85528 Personal history of other malignant neoplasm of kidney: Secondary | ICD-10-CM | POA: Diagnosis not present

## 2022-04-16 DIAGNOSIS — M5126 Other intervertebral disc displacement, lumbar region: Secondary | ICD-10-CM | POA: Diagnosis not present

## 2022-04-16 DIAGNOSIS — N1831 Chronic kidney disease, stage 3a: Secondary | ICD-10-CM | POA: Diagnosis not present

## 2022-04-16 DIAGNOSIS — Z981 Arthrodesis status: Secondary | ICD-10-CM | POA: Diagnosis not present

## 2022-04-16 DIAGNOSIS — M48061 Spinal stenosis, lumbar region without neurogenic claudication: Secondary | ICD-10-CM | POA: Diagnosis not present

## 2022-04-21 DIAGNOSIS — M48061 Spinal stenosis, lumbar region without neurogenic claudication: Secondary | ICD-10-CM | POA: Diagnosis not present

## 2022-04-21 DIAGNOSIS — G4700 Insomnia, unspecified: Secondary | ICD-10-CM | POA: Diagnosis not present

## 2022-04-21 DIAGNOSIS — Z7982 Long term (current) use of aspirin: Secondary | ICD-10-CM | POA: Diagnosis not present

## 2022-04-21 DIAGNOSIS — Z85528 Personal history of other malignant neoplasm of kidney: Secondary | ICD-10-CM | POA: Diagnosis not present

## 2022-04-21 DIAGNOSIS — E7800 Pure hypercholesterolemia, unspecified: Secondary | ICD-10-CM | POA: Diagnosis not present

## 2022-04-21 DIAGNOSIS — M5136 Other intervertebral disc degeneration, lumbar region: Secondary | ICD-10-CM | POA: Diagnosis not present

## 2022-04-21 DIAGNOSIS — G3184 Mild cognitive impairment, so stated: Secondary | ICD-10-CM | POA: Diagnosis not present

## 2022-04-21 DIAGNOSIS — M5020 Other cervical disc displacement, unspecified cervical region: Secondary | ICD-10-CM | POA: Diagnosis not present

## 2022-04-21 DIAGNOSIS — E119 Type 2 diabetes mellitus without complications: Secondary | ICD-10-CM | POA: Diagnosis not present

## 2022-04-21 DIAGNOSIS — M5001 Cervical disc disorder with myelopathy,  high cervical region: Secondary | ICD-10-CM | POA: Diagnosis not present

## 2022-04-21 DIAGNOSIS — I129 Hypertensive chronic kidney disease with stage 1 through stage 4 chronic kidney disease, or unspecified chronic kidney disease: Secondary | ICD-10-CM | POA: Diagnosis not present

## 2022-04-21 DIAGNOSIS — E039 Hypothyroidism, unspecified: Secondary | ICD-10-CM | POA: Diagnosis not present

## 2022-04-21 DIAGNOSIS — R1310 Dysphagia, unspecified: Secondary | ICD-10-CM | POA: Diagnosis not present

## 2022-04-21 DIAGNOSIS — M0510 Rheumatoid lung disease with rheumatoid arthritis of unspecified site: Secondary | ICD-10-CM | POA: Diagnosis not present

## 2022-04-21 DIAGNOSIS — M5126 Other intervertebral disc displacement, lumbar region: Secondary | ICD-10-CM | POA: Diagnosis not present

## 2022-04-21 DIAGNOSIS — N1831 Chronic kidney disease, stage 3a: Secondary | ICD-10-CM | POA: Diagnosis not present

## 2022-04-21 DIAGNOSIS — Z981 Arthrodesis status: Secondary | ICD-10-CM | POA: Diagnosis not present

## 2022-04-21 DIAGNOSIS — G8252 Quadriplegia, C1-C4 incomplete: Secondary | ICD-10-CM | POA: Diagnosis not present

## 2022-04-21 DIAGNOSIS — K649 Unspecified hemorrhoids: Secondary | ICD-10-CM | POA: Diagnosis not present

## 2022-04-21 DIAGNOSIS — Z7952 Long term (current) use of systemic steroids: Secondary | ICD-10-CM | POA: Diagnosis not present

## 2022-04-24 ENCOUNTER — Telehealth: Payer: Self-pay

## 2022-04-24 NOTE — Patient Outreach (Signed)
  Care Coordination   04/24/2022 Name: [name] MRN: [id number] DOB: November 03, 1945   Care Coordination Outreach Attempts:  A third unsuccessful outreach was attempted today to offer the patient with information about available care coordination services as a benefit of their health plan.   Follow Up Plan:  No further outreach attempts will be made at this time. We have been unable to contact the patient to offer or enroll patient in care coordination services  Encounter Outcome:  No Answer  Care Coordination Interventions Activated:  No   Care Coordination Interventions:  No, not indicated    [facility] [phone]

## 2022-04-25 DIAGNOSIS — M5020 Other cervical disc displacement, unspecified cervical region: Secondary | ICD-10-CM | POA: Diagnosis not present

## 2022-04-25 DIAGNOSIS — M0510 Rheumatoid lung disease with rheumatoid arthritis of unspecified site: Secondary | ICD-10-CM | POA: Diagnosis not present

## 2022-04-25 DIAGNOSIS — Z85528 Personal history of other malignant neoplasm of kidney: Secondary | ICD-10-CM | POA: Diagnosis not present

## 2022-04-25 DIAGNOSIS — M5136 Other intervertebral disc degeneration, lumbar region: Secondary | ICD-10-CM | POA: Diagnosis not present

## 2022-04-25 DIAGNOSIS — M48061 Spinal stenosis, lumbar region without neurogenic claudication: Secondary | ICD-10-CM | POA: Diagnosis not present

## 2022-04-25 DIAGNOSIS — G3184 Mild cognitive impairment, so stated: Secondary | ICD-10-CM | POA: Diagnosis not present

## 2022-04-25 DIAGNOSIS — E119 Type 2 diabetes mellitus without complications: Secondary | ICD-10-CM | POA: Diagnosis not present

## 2022-04-25 DIAGNOSIS — Z981 Arthrodesis status: Secondary | ICD-10-CM | POA: Diagnosis not present

## 2022-04-25 DIAGNOSIS — K649 Unspecified hemorrhoids: Secondary | ICD-10-CM | POA: Diagnosis not present

## 2022-04-25 DIAGNOSIS — G4700 Insomnia, unspecified: Secondary | ICD-10-CM | POA: Diagnosis not present

## 2022-04-25 DIAGNOSIS — R1310 Dysphagia, unspecified: Secondary | ICD-10-CM | POA: Diagnosis not present

## 2022-04-25 DIAGNOSIS — N1831 Chronic kidney disease, stage 3a: Secondary | ICD-10-CM | POA: Diagnosis not present

## 2022-04-25 DIAGNOSIS — G8252 Quadriplegia, C1-C4 incomplete: Secondary | ICD-10-CM | POA: Diagnosis not present

## 2022-04-25 DIAGNOSIS — E039 Hypothyroidism, unspecified: Secondary | ICD-10-CM | POA: Diagnosis not present

## 2022-04-25 DIAGNOSIS — Z7952 Long term (current) use of systemic steroids: Secondary | ICD-10-CM | POA: Diagnosis not present

## 2022-04-25 DIAGNOSIS — Z7982 Long term (current) use of aspirin: Secondary | ICD-10-CM | POA: Diagnosis not present

## 2022-04-25 DIAGNOSIS — M5001 Cervical disc disorder with myelopathy,  high cervical region: Secondary | ICD-10-CM | POA: Diagnosis not present

## 2022-04-25 DIAGNOSIS — E7800 Pure hypercholesterolemia, unspecified: Secondary | ICD-10-CM | POA: Diagnosis not present

## 2022-04-25 DIAGNOSIS — M5126 Other intervertebral disc displacement, lumbar region: Secondary | ICD-10-CM | POA: Diagnosis not present

## 2022-04-25 DIAGNOSIS — I129 Hypertensive chronic kidney disease with stage 1 through stage 4 chronic kidney disease, or unspecified chronic kidney disease: Secondary | ICD-10-CM | POA: Diagnosis not present

## 2022-04-30 DIAGNOSIS — M0580 Other rheumatoid arthritis with rheumatoid factor of unspecified site: Secondary | ICD-10-CM | POA: Diagnosis not present

## 2022-04-30 DIAGNOSIS — I10 Essential (primary) hypertension: Secondary | ICD-10-CM | POA: Diagnosis not present

## 2022-04-30 DIAGNOSIS — Z79899 Other long term (current) drug therapy: Secondary | ICD-10-CM | POA: Diagnosis not present

## 2022-05-02 DIAGNOSIS — Z7982 Long term (current) use of aspirin: Secondary | ICD-10-CM | POA: Diagnosis not present

## 2022-05-02 DIAGNOSIS — M48061 Spinal stenosis, lumbar region without neurogenic claudication: Secondary | ICD-10-CM | POA: Diagnosis not present

## 2022-05-02 DIAGNOSIS — G3184 Mild cognitive impairment, so stated: Secondary | ICD-10-CM | POA: Diagnosis not present

## 2022-05-02 DIAGNOSIS — Z85528 Personal history of other malignant neoplasm of kidney: Secondary | ICD-10-CM | POA: Diagnosis not present

## 2022-05-02 DIAGNOSIS — G4700 Insomnia, unspecified: Secondary | ICD-10-CM | POA: Diagnosis not present

## 2022-05-02 DIAGNOSIS — E7800 Pure hypercholesterolemia, unspecified: Secondary | ICD-10-CM | POA: Diagnosis not present

## 2022-05-02 DIAGNOSIS — M0510 Rheumatoid lung disease with rheumatoid arthritis of unspecified site: Secondary | ICD-10-CM | POA: Diagnosis not present

## 2022-05-02 DIAGNOSIS — E119 Type 2 diabetes mellitus without complications: Secondary | ICD-10-CM | POA: Diagnosis not present

## 2022-05-02 DIAGNOSIS — Z981 Arthrodesis status: Secondary | ICD-10-CM | POA: Diagnosis not present

## 2022-05-02 DIAGNOSIS — M5126 Other intervertebral disc displacement, lumbar region: Secondary | ICD-10-CM | POA: Diagnosis not present

## 2022-05-02 DIAGNOSIS — M5001 Cervical disc disorder with myelopathy,  high cervical region: Secondary | ICD-10-CM | POA: Diagnosis not present

## 2022-05-02 DIAGNOSIS — M5136 Other intervertebral disc degeneration, lumbar region: Secondary | ICD-10-CM | POA: Diagnosis not present

## 2022-05-02 DIAGNOSIS — Z7952 Long term (current) use of systemic steroids: Secondary | ICD-10-CM | POA: Diagnosis not present

## 2022-05-02 DIAGNOSIS — I129 Hypertensive chronic kidney disease with stage 1 through stage 4 chronic kidney disease, or unspecified chronic kidney disease: Secondary | ICD-10-CM | POA: Diagnosis not present

## 2022-05-02 DIAGNOSIS — R1310 Dysphagia, unspecified: Secondary | ICD-10-CM | POA: Diagnosis not present

## 2022-05-02 DIAGNOSIS — M5020 Other cervical disc displacement, unspecified cervical region: Secondary | ICD-10-CM | POA: Diagnosis not present

## 2022-05-02 DIAGNOSIS — K649 Unspecified hemorrhoids: Secondary | ICD-10-CM | POA: Diagnosis not present

## 2022-05-02 DIAGNOSIS — G8252 Quadriplegia, C1-C4 incomplete: Secondary | ICD-10-CM | POA: Diagnosis not present

## 2022-05-02 DIAGNOSIS — E039 Hypothyroidism, unspecified: Secondary | ICD-10-CM | POA: Diagnosis not present

## 2022-05-02 DIAGNOSIS — N1831 Chronic kidney disease, stage 3a: Secondary | ICD-10-CM | POA: Diagnosis not present

## 2022-05-06 ENCOUNTER — Inpatient Hospital Stay: Admission: RE | Admit: 2022-05-06 | Payer: Medicare Other | Source: Ambulatory Visit

## 2022-05-06 DIAGNOSIS — M5020 Other cervical disc displacement, unspecified cervical region: Secondary | ICD-10-CM | POA: Diagnosis not present

## 2022-05-06 DIAGNOSIS — Z7952 Long term (current) use of systemic steroids: Secondary | ICD-10-CM | POA: Diagnosis not present

## 2022-05-06 DIAGNOSIS — M5136 Other intervertebral disc degeneration, lumbar region: Secondary | ICD-10-CM | POA: Diagnosis not present

## 2022-05-06 DIAGNOSIS — E119 Type 2 diabetes mellitus without complications: Secondary | ICD-10-CM | POA: Diagnosis not present

## 2022-05-06 DIAGNOSIS — M0510 Rheumatoid lung disease with rheumatoid arthritis of unspecified site: Secondary | ICD-10-CM | POA: Diagnosis not present

## 2022-05-06 DIAGNOSIS — Z7982 Long term (current) use of aspirin: Secondary | ICD-10-CM | POA: Diagnosis not present

## 2022-05-06 DIAGNOSIS — Z981 Arthrodesis status: Secondary | ICD-10-CM | POA: Diagnosis not present

## 2022-05-06 DIAGNOSIS — M5001 Cervical disc disorder with myelopathy,  high cervical region: Secondary | ICD-10-CM | POA: Diagnosis not present

## 2022-05-06 DIAGNOSIS — G4700 Insomnia, unspecified: Secondary | ICD-10-CM | POA: Diagnosis not present

## 2022-05-06 DIAGNOSIS — M48061 Spinal stenosis, lumbar region without neurogenic claudication: Secondary | ICD-10-CM | POA: Diagnosis not present

## 2022-05-06 DIAGNOSIS — M5126 Other intervertebral disc displacement, lumbar region: Secondary | ICD-10-CM | POA: Diagnosis not present

## 2022-05-06 DIAGNOSIS — Z85528 Personal history of other malignant neoplasm of kidney: Secondary | ICD-10-CM | POA: Diagnosis not present

## 2022-05-06 DIAGNOSIS — K649 Unspecified hemorrhoids: Secondary | ICD-10-CM | POA: Diagnosis not present

## 2022-05-06 DIAGNOSIS — G8252 Quadriplegia, C1-C4 incomplete: Secondary | ICD-10-CM | POA: Diagnosis not present

## 2022-05-06 DIAGNOSIS — R1310 Dysphagia, unspecified: Secondary | ICD-10-CM | POA: Diagnosis not present

## 2022-05-06 DIAGNOSIS — G3184 Mild cognitive impairment, so stated: Secondary | ICD-10-CM | POA: Diagnosis not present

## 2022-05-06 DIAGNOSIS — E7800 Pure hypercholesterolemia, unspecified: Secondary | ICD-10-CM | POA: Diagnosis not present

## 2022-05-06 DIAGNOSIS — E039 Hypothyroidism, unspecified: Secondary | ICD-10-CM | POA: Diagnosis not present

## 2022-05-06 DIAGNOSIS — I129 Hypertensive chronic kidney disease with stage 1 through stage 4 chronic kidney disease, or unspecified chronic kidney disease: Secondary | ICD-10-CM | POA: Diagnosis not present

## 2022-05-06 DIAGNOSIS — N1831 Chronic kidney disease, stage 3a: Secondary | ICD-10-CM | POA: Diagnosis not present

## 2022-05-08 DIAGNOSIS — G4700 Insomnia, unspecified: Secondary | ICD-10-CM | POA: Diagnosis not present

## 2022-05-08 DIAGNOSIS — E039 Hypothyroidism, unspecified: Secondary | ICD-10-CM | POA: Diagnosis not present

## 2022-05-08 DIAGNOSIS — M5126 Other intervertebral disc displacement, lumbar region: Secondary | ICD-10-CM | POA: Diagnosis not present

## 2022-05-08 DIAGNOSIS — G8252 Quadriplegia, C1-C4 incomplete: Secondary | ICD-10-CM | POA: Diagnosis not present

## 2022-05-08 DIAGNOSIS — K649 Unspecified hemorrhoids: Secondary | ICD-10-CM | POA: Diagnosis not present

## 2022-05-08 DIAGNOSIS — Z7982 Long term (current) use of aspirin: Secondary | ICD-10-CM | POA: Diagnosis not present

## 2022-05-08 DIAGNOSIS — G3184 Mild cognitive impairment, so stated: Secondary | ICD-10-CM | POA: Diagnosis not present

## 2022-05-08 DIAGNOSIS — Z7952 Long term (current) use of systemic steroids: Secondary | ICD-10-CM | POA: Diagnosis not present

## 2022-05-08 DIAGNOSIS — I129 Hypertensive chronic kidney disease with stage 1 through stage 4 chronic kidney disease, or unspecified chronic kidney disease: Secondary | ICD-10-CM | POA: Diagnosis not present

## 2022-05-08 DIAGNOSIS — M5001 Cervical disc disorder with myelopathy,  high cervical region: Secondary | ICD-10-CM | POA: Diagnosis not present

## 2022-05-08 DIAGNOSIS — M48061 Spinal stenosis, lumbar region without neurogenic claudication: Secondary | ICD-10-CM | POA: Diagnosis not present

## 2022-05-08 DIAGNOSIS — R1310 Dysphagia, unspecified: Secondary | ICD-10-CM | POA: Diagnosis not present

## 2022-05-08 DIAGNOSIS — M5136 Other intervertebral disc degeneration, lumbar region: Secondary | ICD-10-CM | POA: Diagnosis not present

## 2022-05-08 DIAGNOSIS — M0510 Rheumatoid lung disease with rheumatoid arthritis of unspecified site: Secondary | ICD-10-CM | POA: Diagnosis not present

## 2022-05-08 DIAGNOSIS — M5020 Other cervical disc displacement, unspecified cervical region: Secondary | ICD-10-CM | POA: Diagnosis not present

## 2022-05-08 DIAGNOSIS — E119 Type 2 diabetes mellitus without complications: Secondary | ICD-10-CM | POA: Diagnosis not present

## 2022-05-08 DIAGNOSIS — N1831 Chronic kidney disease, stage 3a: Secondary | ICD-10-CM | POA: Diagnosis not present

## 2022-05-08 DIAGNOSIS — Z981 Arthrodesis status: Secondary | ICD-10-CM | POA: Diagnosis not present

## 2022-05-08 DIAGNOSIS — E7800 Pure hypercholesterolemia, unspecified: Secondary | ICD-10-CM | POA: Diagnosis not present

## 2022-05-08 DIAGNOSIS — Z85528 Personal history of other malignant neoplasm of kidney: Secondary | ICD-10-CM | POA: Diagnosis not present

## 2022-05-09 DIAGNOSIS — E039 Hypothyroidism, unspecified: Secondary | ICD-10-CM | POA: Diagnosis not present

## 2022-05-09 DIAGNOSIS — M5001 Cervical disc disorder with myelopathy,  high cervical region: Secondary | ICD-10-CM | POA: Diagnosis not present

## 2022-05-09 DIAGNOSIS — M5136 Other intervertebral disc degeneration, lumbar region: Secondary | ICD-10-CM | POA: Diagnosis not present

## 2022-05-09 DIAGNOSIS — Z7952 Long term (current) use of systemic steroids: Secondary | ICD-10-CM | POA: Diagnosis not present

## 2022-05-09 DIAGNOSIS — R1310 Dysphagia, unspecified: Secondary | ICD-10-CM | POA: Diagnosis not present

## 2022-05-09 DIAGNOSIS — N1831 Chronic kidney disease, stage 3a: Secondary | ICD-10-CM | POA: Diagnosis not present

## 2022-05-09 DIAGNOSIS — E7800 Pure hypercholesterolemia, unspecified: Secondary | ICD-10-CM | POA: Diagnosis not present

## 2022-05-09 DIAGNOSIS — Z85528 Personal history of other malignant neoplasm of kidney: Secondary | ICD-10-CM | POA: Diagnosis not present

## 2022-05-09 DIAGNOSIS — E119 Type 2 diabetes mellitus without complications: Secondary | ICD-10-CM | POA: Diagnosis not present

## 2022-05-09 DIAGNOSIS — I129 Hypertensive chronic kidney disease with stage 1 through stage 4 chronic kidney disease, or unspecified chronic kidney disease: Secondary | ICD-10-CM | POA: Diagnosis not present

## 2022-05-09 DIAGNOSIS — G3184 Mild cognitive impairment, so stated: Secondary | ICD-10-CM | POA: Diagnosis not present

## 2022-05-09 DIAGNOSIS — G8252 Quadriplegia, C1-C4 incomplete: Secondary | ICD-10-CM | POA: Diagnosis not present

## 2022-05-09 DIAGNOSIS — K649 Unspecified hemorrhoids: Secondary | ICD-10-CM | POA: Diagnosis not present

## 2022-05-09 DIAGNOSIS — M5020 Other cervical disc displacement, unspecified cervical region: Secondary | ICD-10-CM | POA: Diagnosis not present

## 2022-05-09 DIAGNOSIS — Z7982 Long term (current) use of aspirin: Secondary | ICD-10-CM | POA: Diagnosis not present

## 2022-05-09 DIAGNOSIS — Z981 Arthrodesis status: Secondary | ICD-10-CM | POA: Diagnosis not present

## 2022-05-09 DIAGNOSIS — G4700 Insomnia, unspecified: Secondary | ICD-10-CM | POA: Diagnosis not present

## 2022-05-09 DIAGNOSIS — M48061 Spinal stenosis, lumbar region without neurogenic claudication: Secondary | ICD-10-CM | POA: Diagnosis not present

## 2022-05-09 DIAGNOSIS — M0510 Rheumatoid lung disease with rheumatoid arthritis of unspecified site: Secondary | ICD-10-CM | POA: Diagnosis not present

## 2022-05-09 DIAGNOSIS — M5126 Other intervertebral disc displacement, lumbar region: Secondary | ICD-10-CM | POA: Diagnosis not present

## 2022-05-13 DIAGNOSIS — M48061 Spinal stenosis, lumbar region without neurogenic claudication: Secondary | ICD-10-CM | POA: Diagnosis not present

## 2022-05-13 DIAGNOSIS — E7800 Pure hypercholesterolemia, unspecified: Secondary | ICD-10-CM | POA: Diagnosis not present

## 2022-05-13 DIAGNOSIS — M5136 Other intervertebral disc degeneration, lumbar region: Secondary | ICD-10-CM | POA: Diagnosis not present

## 2022-05-13 DIAGNOSIS — M5020 Other cervical disc displacement, unspecified cervical region: Secondary | ICD-10-CM | POA: Diagnosis not present

## 2022-05-13 DIAGNOSIS — M5126 Other intervertebral disc displacement, lumbar region: Secondary | ICD-10-CM | POA: Diagnosis not present

## 2022-05-13 DIAGNOSIS — K649 Unspecified hemorrhoids: Secondary | ICD-10-CM | POA: Diagnosis not present

## 2022-05-13 DIAGNOSIS — G4700 Insomnia, unspecified: Secondary | ICD-10-CM | POA: Diagnosis not present

## 2022-05-13 DIAGNOSIS — N1831 Chronic kidney disease, stage 3a: Secondary | ICD-10-CM | POA: Diagnosis not present

## 2022-05-13 DIAGNOSIS — I129 Hypertensive chronic kidney disease with stage 1 through stage 4 chronic kidney disease, or unspecified chronic kidney disease: Secondary | ICD-10-CM | POA: Diagnosis not present

## 2022-05-13 DIAGNOSIS — G8252 Quadriplegia, C1-C4 incomplete: Secondary | ICD-10-CM | POA: Diagnosis not present

## 2022-05-13 DIAGNOSIS — M0510 Rheumatoid lung disease with rheumatoid arthritis of unspecified site: Secondary | ICD-10-CM | POA: Diagnosis not present

## 2022-05-13 DIAGNOSIS — E039 Hypothyroidism, unspecified: Secondary | ICD-10-CM | POA: Diagnosis not present

## 2022-05-13 DIAGNOSIS — G3184 Mild cognitive impairment, so stated: Secondary | ICD-10-CM | POA: Diagnosis not present

## 2022-05-13 DIAGNOSIS — Z7952 Long term (current) use of systemic steroids: Secondary | ICD-10-CM | POA: Diagnosis not present

## 2022-05-13 DIAGNOSIS — Z85528 Personal history of other malignant neoplasm of kidney: Secondary | ICD-10-CM | POA: Diagnosis not present

## 2022-05-13 DIAGNOSIS — E119 Type 2 diabetes mellitus without complications: Secondary | ICD-10-CM | POA: Diagnosis not present

## 2022-05-13 DIAGNOSIS — R1310 Dysphagia, unspecified: Secondary | ICD-10-CM | POA: Diagnosis not present

## 2022-05-13 DIAGNOSIS — Z981 Arthrodesis status: Secondary | ICD-10-CM | POA: Diagnosis not present

## 2022-05-13 DIAGNOSIS — Z7982 Long term (current) use of aspirin: Secondary | ICD-10-CM | POA: Diagnosis not present

## 2022-05-13 DIAGNOSIS — M5001 Cervical disc disorder with myelopathy,  high cervical region: Secondary | ICD-10-CM | POA: Diagnosis not present

## 2022-05-14 DIAGNOSIS — M0580 Other rheumatoid arthritis with rheumatoid factor of unspecified site: Secondary | ICD-10-CM | POA: Diagnosis not present

## 2022-05-14 DIAGNOSIS — H6123 Impacted cerumen, bilateral: Secondary | ICD-10-CM | POA: Diagnosis not present

## 2022-05-14 DIAGNOSIS — I10 Essential (primary) hypertension: Secondary | ICD-10-CM | POA: Diagnosis not present

## 2022-05-16 DIAGNOSIS — G3184 Mild cognitive impairment, so stated: Secondary | ICD-10-CM | POA: Diagnosis not present

## 2022-05-16 DIAGNOSIS — G8252 Quadriplegia, C1-C4 incomplete: Secondary | ICD-10-CM | POA: Diagnosis not present

## 2022-05-16 DIAGNOSIS — Z7982 Long term (current) use of aspirin: Secondary | ICD-10-CM | POA: Diagnosis not present

## 2022-05-16 DIAGNOSIS — G4700 Insomnia, unspecified: Secondary | ICD-10-CM | POA: Diagnosis not present

## 2022-05-16 DIAGNOSIS — Z7952 Long term (current) use of systemic steroids: Secondary | ICD-10-CM | POA: Diagnosis not present

## 2022-05-16 DIAGNOSIS — M5020 Other cervical disc displacement, unspecified cervical region: Secondary | ICD-10-CM | POA: Diagnosis not present

## 2022-05-16 DIAGNOSIS — N1831 Chronic kidney disease, stage 3a: Secondary | ICD-10-CM | POA: Diagnosis not present

## 2022-05-16 DIAGNOSIS — E119 Type 2 diabetes mellitus without complications: Secondary | ICD-10-CM | POA: Diagnosis not present

## 2022-05-16 DIAGNOSIS — E039 Hypothyroidism, unspecified: Secondary | ICD-10-CM | POA: Diagnosis not present

## 2022-05-16 DIAGNOSIS — M0510 Rheumatoid lung disease with rheumatoid arthritis of unspecified site: Secondary | ICD-10-CM | POA: Diagnosis not present

## 2022-05-16 DIAGNOSIS — I129 Hypertensive chronic kidney disease with stage 1 through stage 4 chronic kidney disease, or unspecified chronic kidney disease: Secondary | ICD-10-CM | POA: Diagnosis not present

## 2022-05-16 DIAGNOSIS — E7800 Pure hypercholesterolemia, unspecified: Secondary | ICD-10-CM | POA: Diagnosis not present

## 2022-05-16 DIAGNOSIS — M48061 Spinal stenosis, lumbar region without neurogenic claudication: Secondary | ICD-10-CM | POA: Diagnosis not present

## 2022-05-16 DIAGNOSIS — R1310 Dysphagia, unspecified: Secondary | ICD-10-CM | POA: Diagnosis not present

## 2022-05-16 DIAGNOSIS — M5001 Cervical disc disorder with myelopathy,  high cervical region: Secondary | ICD-10-CM | POA: Diagnosis not present

## 2022-05-16 DIAGNOSIS — M5136 Other intervertebral disc degeneration, lumbar region: Secondary | ICD-10-CM | POA: Diagnosis not present

## 2022-05-16 DIAGNOSIS — Z85528 Personal history of other malignant neoplasm of kidney: Secondary | ICD-10-CM | POA: Diagnosis not present

## 2022-05-16 DIAGNOSIS — M5126 Other intervertebral disc displacement, lumbar region: Secondary | ICD-10-CM | POA: Diagnosis not present

## 2022-05-16 DIAGNOSIS — K649 Unspecified hemorrhoids: Secondary | ICD-10-CM | POA: Diagnosis not present

## 2022-05-16 DIAGNOSIS — Z981 Arthrodesis status: Secondary | ICD-10-CM | POA: Diagnosis not present

## 2022-05-19 DIAGNOSIS — I129 Hypertensive chronic kidney disease with stage 1 through stage 4 chronic kidney disease, or unspecified chronic kidney disease: Secondary | ICD-10-CM | POA: Diagnosis not present

## 2022-05-19 DIAGNOSIS — E039 Hypothyroidism, unspecified: Secondary | ICD-10-CM | POA: Diagnosis not present

## 2022-05-19 DIAGNOSIS — M5001 Cervical disc disorder with myelopathy,  high cervical region: Secondary | ICD-10-CM | POA: Diagnosis not present

## 2022-05-19 DIAGNOSIS — N1831 Chronic kidney disease, stage 3a: Secondary | ICD-10-CM | POA: Diagnosis not present

## 2022-05-19 DIAGNOSIS — Z85528 Personal history of other malignant neoplasm of kidney: Secondary | ICD-10-CM | POA: Diagnosis not present

## 2022-05-19 DIAGNOSIS — E119 Type 2 diabetes mellitus without complications: Secondary | ICD-10-CM | POA: Diagnosis not present

## 2022-05-19 DIAGNOSIS — M0510 Rheumatoid lung disease with rheumatoid arthritis of unspecified site: Secondary | ICD-10-CM | POA: Diagnosis not present

## 2022-05-19 DIAGNOSIS — Z981 Arthrodesis status: Secondary | ICD-10-CM | POA: Diagnosis not present

## 2022-05-19 DIAGNOSIS — M5126 Other intervertebral disc displacement, lumbar region: Secondary | ICD-10-CM | POA: Diagnosis not present

## 2022-05-19 DIAGNOSIS — G3184 Mild cognitive impairment, so stated: Secondary | ICD-10-CM | POA: Diagnosis not present

## 2022-05-19 DIAGNOSIS — E7800 Pure hypercholesterolemia, unspecified: Secondary | ICD-10-CM | POA: Diagnosis not present

## 2022-05-19 DIAGNOSIS — R1310 Dysphagia, unspecified: Secondary | ICD-10-CM | POA: Diagnosis not present

## 2022-05-19 DIAGNOSIS — M5136 Other intervertebral disc degeneration, lumbar region: Secondary | ICD-10-CM | POA: Diagnosis not present

## 2022-05-19 DIAGNOSIS — G4700 Insomnia, unspecified: Secondary | ICD-10-CM | POA: Diagnosis not present

## 2022-05-19 DIAGNOSIS — Z7952 Long term (current) use of systemic steroids: Secondary | ICD-10-CM | POA: Diagnosis not present

## 2022-05-19 DIAGNOSIS — Z7982 Long term (current) use of aspirin: Secondary | ICD-10-CM | POA: Diagnosis not present

## 2022-05-19 DIAGNOSIS — G8252 Quadriplegia, C1-C4 incomplete: Secondary | ICD-10-CM | POA: Diagnosis not present

## 2022-05-19 DIAGNOSIS — M48061 Spinal stenosis, lumbar region without neurogenic claudication: Secondary | ICD-10-CM | POA: Diagnosis not present

## 2022-05-19 DIAGNOSIS — K649 Unspecified hemorrhoids: Secondary | ICD-10-CM | POA: Diagnosis not present

## 2022-05-19 DIAGNOSIS — M5020 Other cervical disc displacement, unspecified cervical region: Secondary | ICD-10-CM | POA: Diagnosis not present

## 2022-05-20 DIAGNOSIS — G4700 Insomnia, unspecified: Secondary | ICD-10-CM | POA: Diagnosis not present

## 2022-05-20 DIAGNOSIS — Z85528 Personal history of other malignant neoplasm of kidney: Secondary | ICD-10-CM | POA: Diagnosis not present

## 2022-05-20 DIAGNOSIS — E119 Type 2 diabetes mellitus without complications: Secondary | ICD-10-CM | POA: Diagnosis not present

## 2022-05-20 DIAGNOSIS — M5001 Cervical disc disorder with myelopathy,  high cervical region: Secondary | ICD-10-CM | POA: Diagnosis not present

## 2022-05-20 DIAGNOSIS — Z7982 Long term (current) use of aspirin: Secondary | ICD-10-CM | POA: Diagnosis not present

## 2022-05-20 DIAGNOSIS — Z7952 Long term (current) use of systemic steroids: Secondary | ICD-10-CM | POA: Diagnosis not present

## 2022-05-20 DIAGNOSIS — Z981 Arthrodesis status: Secondary | ICD-10-CM | POA: Diagnosis not present

## 2022-05-20 DIAGNOSIS — M48061 Spinal stenosis, lumbar region without neurogenic claudication: Secondary | ICD-10-CM | POA: Diagnosis not present

## 2022-05-20 DIAGNOSIS — R1310 Dysphagia, unspecified: Secondary | ICD-10-CM | POA: Diagnosis not present

## 2022-05-20 DIAGNOSIS — G8252 Quadriplegia, C1-C4 incomplete: Secondary | ICD-10-CM | POA: Diagnosis not present

## 2022-05-20 DIAGNOSIS — M5136 Other intervertebral disc degeneration, lumbar region: Secondary | ICD-10-CM | POA: Diagnosis not present

## 2022-05-20 DIAGNOSIS — M5126 Other intervertebral disc displacement, lumbar region: Secondary | ICD-10-CM | POA: Diagnosis not present

## 2022-05-20 DIAGNOSIS — N1831 Chronic kidney disease, stage 3a: Secondary | ICD-10-CM | POA: Diagnosis not present

## 2022-05-20 DIAGNOSIS — I129 Hypertensive chronic kidney disease with stage 1 through stage 4 chronic kidney disease, or unspecified chronic kidney disease: Secondary | ICD-10-CM | POA: Diagnosis not present

## 2022-05-20 DIAGNOSIS — E7800 Pure hypercholesterolemia, unspecified: Secondary | ICD-10-CM | POA: Diagnosis not present

## 2022-05-20 DIAGNOSIS — M5020 Other cervical disc displacement, unspecified cervical region: Secondary | ICD-10-CM | POA: Diagnosis not present

## 2022-05-20 DIAGNOSIS — E039 Hypothyroidism, unspecified: Secondary | ICD-10-CM | POA: Diagnosis not present

## 2022-05-20 DIAGNOSIS — M0510 Rheumatoid lung disease with rheumatoid arthritis of unspecified site: Secondary | ICD-10-CM | POA: Diagnosis not present

## 2022-05-20 DIAGNOSIS — G3184 Mild cognitive impairment, so stated: Secondary | ICD-10-CM | POA: Diagnosis not present

## 2022-05-20 DIAGNOSIS — K649 Unspecified hemorrhoids: Secondary | ICD-10-CM | POA: Diagnosis not present

## 2022-05-21 DIAGNOSIS — I10 Essential (primary) hypertension: Secondary | ICD-10-CM | POA: Diagnosis not present

## 2022-05-21 DIAGNOSIS — M0580 Other rheumatoid arthritis with rheumatoid factor of unspecified site: Secondary | ICD-10-CM | POA: Diagnosis not present

## 2022-05-21 DIAGNOSIS — R21 Rash and other nonspecific skin eruption: Secondary | ICD-10-CM | POA: Diagnosis not present

## 2022-05-27 DIAGNOSIS — I129 Hypertensive chronic kidney disease with stage 1 through stage 4 chronic kidney disease, or unspecified chronic kidney disease: Secondary | ICD-10-CM | POA: Diagnosis not present

## 2022-05-27 DIAGNOSIS — E119 Type 2 diabetes mellitus without complications: Secondary | ICD-10-CM | POA: Diagnosis not present

## 2022-05-27 DIAGNOSIS — K649 Unspecified hemorrhoids: Secondary | ICD-10-CM | POA: Diagnosis not present

## 2022-05-27 DIAGNOSIS — M5001 Cervical disc disorder with myelopathy,  high cervical region: Secondary | ICD-10-CM | POA: Diagnosis not present

## 2022-05-27 DIAGNOSIS — Z7952 Long term (current) use of systemic steroids: Secondary | ICD-10-CM | POA: Diagnosis not present

## 2022-05-27 DIAGNOSIS — G4700 Insomnia, unspecified: Secondary | ICD-10-CM | POA: Diagnosis not present

## 2022-05-27 DIAGNOSIS — M5126 Other intervertebral disc displacement, lumbar region: Secondary | ICD-10-CM | POA: Diagnosis not present

## 2022-05-27 DIAGNOSIS — G3184 Mild cognitive impairment, so stated: Secondary | ICD-10-CM | POA: Diagnosis not present

## 2022-05-27 DIAGNOSIS — N1831 Chronic kidney disease, stage 3a: Secondary | ICD-10-CM | POA: Diagnosis not present

## 2022-05-27 DIAGNOSIS — Z7982 Long term (current) use of aspirin: Secondary | ICD-10-CM | POA: Diagnosis not present

## 2022-05-27 DIAGNOSIS — Z981 Arthrodesis status: Secondary | ICD-10-CM | POA: Diagnosis not present

## 2022-05-27 DIAGNOSIS — E039 Hypothyroidism, unspecified: Secondary | ICD-10-CM | POA: Diagnosis not present

## 2022-05-27 DIAGNOSIS — Z85528 Personal history of other malignant neoplasm of kidney: Secondary | ICD-10-CM | POA: Diagnosis not present

## 2022-05-27 DIAGNOSIS — E7800 Pure hypercholesterolemia, unspecified: Secondary | ICD-10-CM | POA: Diagnosis not present

## 2022-05-27 DIAGNOSIS — M5136 Other intervertebral disc degeneration, lumbar region: Secondary | ICD-10-CM | POA: Diagnosis not present

## 2022-05-27 DIAGNOSIS — M48061 Spinal stenosis, lumbar region without neurogenic claudication: Secondary | ICD-10-CM | POA: Diagnosis not present

## 2022-05-27 DIAGNOSIS — R1310 Dysphagia, unspecified: Secondary | ICD-10-CM | POA: Diagnosis not present

## 2022-05-27 DIAGNOSIS — G8252 Quadriplegia, C1-C4 incomplete: Secondary | ICD-10-CM | POA: Diagnosis not present

## 2022-05-27 DIAGNOSIS — M0510 Rheumatoid lung disease with rheumatoid arthritis of unspecified site: Secondary | ICD-10-CM | POA: Diagnosis not present

## 2022-05-27 DIAGNOSIS — M5020 Other cervical disc displacement, unspecified cervical region: Secondary | ICD-10-CM | POA: Diagnosis not present

## 2022-05-28 DIAGNOSIS — I10 Essential (primary) hypertension: Secondary | ICD-10-CM | POA: Diagnosis not present

## 2022-05-28 DIAGNOSIS — M0580 Other rheumatoid arthritis with rheumatoid factor of unspecified site: Secondary | ICD-10-CM | POA: Diagnosis not present

## 2022-05-29 DIAGNOSIS — G8252 Quadriplegia, C1-C4 incomplete: Secondary | ICD-10-CM | POA: Diagnosis not present

## 2022-05-29 DIAGNOSIS — M48061 Spinal stenosis, lumbar region without neurogenic claudication: Secondary | ICD-10-CM | POA: Diagnosis not present

## 2022-05-29 DIAGNOSIS — M5001 Cervical disc disorder with myelopathy,  high cervical region: Secondary | ICD-10-CM | POA: Diagnosis not present

## 2022-05-29 DIAGNOSIS — Z7982 Long term (current) use of aspirin: Secondary | ICD-10-CM | POA: Diagnosis not present

## 2022-05-29 DIAGNOSIS — G4700 Insomnia, unspecified: Secondary | ICD-10-CM | POA: Diagnosis not present

## 2022-05-29 DIAGNOSIS — K649 Unspecified hemorrhoids: Secondary | ICD-10-CM | POA: Diagnosis not present

## 2022-05-29 DIAGNOSIS — I129 Hypertensive chronic kidney disease with stage 1 through stage 4 chronic kidney disease, or unspecified chronic kidney disease: Secondary | ICD-10-CM | POA: Diagnosis not present

## 2022-05-29 DIAGNOSIS — M0510 Rheumatoid lung disease with rheumatoid arthritis of unspecified site: Secondary | ICD-10-CM | POA: Diagnosis not present

## 2022-05-29 DIAGNOSIS — R1310 Dysphagia, unspecified: Secondary | ICD-10-CM | POA: Diagnosis not present

## 2022-05-29 DIAGNOSIS — E039 Hypothyroidism, unspecified: Secondary | ICD-10-CM | POA: Diagnosis not present

## 2022-05-29 DIAGNOSIS — E7800 Pure hypercholesterolemia, unspecified: Secondary | ICD-10-CM | POA: Diagnosis not present

## 2022-05-29 DIAGNOSIS — M5126 Other intervertebral disc displacement, lumbar region: Secondary | ICD-10-CM | POA: Diagnosis not present

## 2022-05-29 DIAGNOSIS — M5020 Other cervical disc displacement, unspecified cervical region: Secondary | ICD-10-CM | POA: Diagnosis not present

## 2022-05-29 DIAGNOSIS — G3184 Mild cognitive impairment, so stated: Secondary | ICD-10-CM | POA: Diagnosis not present

## 2022-05-29 DIAGNOSIS — Z981 Arthrodesis status: Secondary | ICD-10-CM | POA: Diagnosis not present

## 2022-05-29 DIAGNOSIS — Z7952 Long term (current) use of systemic steroids: Secondary | ICD-10-CM | POA: Diagnosis not present

## 2022-05-29 DIAGNOSIS — Z85528 Personal history of other malignant neoplasm of kidney: Secondary | ICD-10-CM | POA: Diagnosis not present

## 2022-05-29 DIAGNOSIS — E119 Type 2 diabetes mellitus without complications: Secondary | ICD-10-CM | POA: Diagnosis not present

## 2022-05-29 DIAGNOSIS — M5136 Other intervertebral disc degeneration, lumbar region: Secondary | ICD-10-CM | POA: Diagnosis not present

## 2022-05-29 DIAGNOSIS — N1831 Chronic kidney disease, stage 3a: Secondary | ICD-10-CM | POA: Diagnosis not present

## 2022-06-02 DIAGNOSIS — M5001 Cervical disc disorder with myelopathy,  high cervical region: Secondary | ICD-10-CM | POA: Diagnosis not present

## 2022-06-02 DIAGNOSIS — G8252 Quadriplegia, C1-C4 incomplete: Secondary | ICD-10-CM | POA: Diagnosis not present

## 2022-06-02 DIAGNOSIS — M48061 Spinal stenosis, lumbar region without neurogenic claudication: Secondary | ICD-10-CM | POA: Diagnosis not present

## 2022-06-02 DIAGNOSIS — G4700 Insomnia, unspecified: Secondary | ICD-10-CM | POA: Diagnosis not present

## 2022-06-02 DIAGNOSIS — I129 Hypertensive chronic kidney disease with stage 1 through stage 4 chronic kidney disease, or unspecified chronic kidney disease: Secondary | ICD-10-CM | POA: Diagnosis not present

## 2022-06-02 DIAGNOSIS — M0510 Rheumatoid lung disease with rheumatoid arthritis of unspecified site: Secondary | ICD-10-CM | POA: Diagnosis not present

## 2022-06-02 DIAGNOSIS — R1310 Dysphagia, unspecified: Secondary | ICD-10-CM | POA: Diagnosis not present

## 2022-06-02 DIAGNOSIS — E7800 Pure hypercholesterolemia, unspecified: Secondary | ICD-10-CM | POA: Diagnosis not present

## 2022-06-02 DIAGNOSIS — N1831 Chronic kidney disease, stage 3a: Secondary | ICD-10-CM | POA: Diagnosis not present

## 2022-06-02 DIAGNOSIS — Z85528 Personal history of other malignant neoplasm of kidney: Secondary | ICD-10-CM | POA: Diagnosis not present

## 2022-06-02 DIAGNOSIS — G3184 Mild cognitive impairment, so stated: Secondary | ICD-10-CM | POA: Diagnosis not present

## 2022-06-02 DIAGNOSIS — M5126 Other intervertebral disc displacement, lumbar region: Secondary | ICD-10-CM | POA: Diagnosis not present

## 2022-06-02 DIAGNOSIS — M5136 Other intervertebral disc degeneration, lumbar region: Secondary | ICD-10-CM | POA: Diagnosis not present

## 2022-06-02 DIAGNOSIS — K649 Unspecified hemorrhoids: Secondary | ICD-10-CM | POA: Diagnosis not present

## 2022-06-02 DIAGNOSIS — Z981 Arthrodesis status: Secondary | ICD-10-CM | POA: Diagnosis not present

## 2022-06-02 DIAGNOSIS — E119 Type 2 diabetes mellitus without complications: Secondary | ICD-10-CM | POA: Diagnosis not present

## 2022-06-02 DIAGNOSIS — E039 Hypothyroidism, unspecified: Secondary | ICD-10-CM | POA: Diagnosis not present

## 2022-06-02 DIAGNOSIS — Z7982 Long term (current) use of aspirin: Secondary | ICD-10-CM | POA: Diagnosis not present

## 2022-06-02 DIAGNOSIS — M5020 Other cervical disc displacement, unspecified cervical region: Secondary | ICD-10-CM | POA: Diagnosis not present

## 2022-06-02 DIAGNOSIS — Z7952 Long term (current) use of systemic steroids: Secondary | ICD-10-CM | POA: Diagnosis not present

## 2022-06-03 DIAGNOSIS — M069 Rheumatoid arthritis, unspecified: Secondary | ICD-10-CM | POA: Diagnosis not present

## 2022-06-03 DIAGNOSIS — I10 Essential (primary) hypertension: Secondary | ICD-10-CM | POA: Diagnosis not present

## 2022-06-03 DIAGNOSIS — Z993 Dependence on wheelchair: Secondary | ICD-10-CM | POA: Diagnosis not present

## 2022-06-09 DIAGNOSIS — E119 Type 2 diabetes mellitus without complications: Secondary | ICD-10-CM | POA: Diagnosis not present

## 2022-06-09 DIAGNOSIS — M48061 Spinal stenosis, lumbar region without neurogenic claudication: Secondary | ICD-10-CM | POA: Diagnosis not present

## 2022-06-09 DIAGNOSIS — G8252 Quadriplegia, C1-C4 incomplete: Secondary | ICD-10-CM | POA: Diagnosis not present

## 2022-06-09 DIAGNOSIS — K649 Unspecified hemorrhoids: Secondary | ICD-10-CM | POA: Diagnosis not present

## 2022-06-09 DIAGNOSIS — E7800 Pure hypercholesterolemia, unspecified: Secondary | ICD-10-CM | POA: Diagnosis not present

## 2022-06-09 DIAGNOSIS — I129 Hypertensive chronic kidney disease with stage 1 through stage 4 chronic kidney disease, or unspecified chronic kidney disease: Secondary | ICD-10-CM | POA: Diagnosis not present

## 2022-06-09 DIAGNOSIS — N1831 Chronic kidney disease, stage 3a: Secondary | ICD-10-CM | POA: Diagnosis not present

## 2022-06-09 DIAGNOSIS — M5136 Other intervertebral disc degeneration, lumbar region: Secondary | ICD-10-CM | POA: Diagnosis not present

## 2022-06-09 DIAGNOSIS — G3184 Mild cognitive impairment, so stated: Secondary | ICD-10-CM | POA: Diagnosis not present

## 2022-06-09 DIAGNOSIS — M0510 Rheumatoid lung disease with rheumatoid arthritis of unspecified site: Secondary | ICD-10-CM | POA: Diagnosis not present

## 2022-06-09 DIAGNOSIS — R1310 Dysphagia, unspecified: Secondary | ICD-10-CM | POA: Diagnosis not present

## 2022-06-09 DIAGNOSIS — M5020 Other cervical disc displacement, unspecified cervical region: Secondary | ICD-10-CM | POA: Diagnosis not present

## 2022-06-09 DIAGNOSIS — G4700 Insomnia, unspecified: Secondary | ICD-10-CM | POA: Diagnosis not present

## 2022-06-09 DIAGNOSIS — Z981 Arthrodesis status: Secondary | ICD-10-CM | POA: Diagnosis not present

## 2022-06-09 DIAGNOSIS — Z7982 Long term (current) use of aspirin: Secondary | ICD-10-CM | POA: Diagnosis not present

## 2022-06-09 DIAGNOSIS — Z85528 Personal history of other malignant neoplasm of kidney: Secondary | ICD-10-CM | POA: Diagnosis not present

## 2022-06-09 DIAGNOSIS — M5126 Other intervertebral disc displacement, lumbar region: Secondary | ICD-10-CM | POA: Diagnosis not present

## 2022-06-09 DIAGNOSIS — M5001 Cervical disc disorder with myelopathy,  high cervical region: Secondary | ICD-10-CM | POA: Diagnosis not present

## 2022-06-09 DIAGNOSIS — E039 Hypothyroidism, unspecified: Secondary | ICD-10-CM | POA: Diagnosis not present

## 2022-06-09 DIAGNOSIS — Z7952 Long term (current) use of systemic steroids: Secondary | ICD-10-CM | POA: Diagnosis not present

## 2022-06-11 DIAGNOSIS — M0580 Other rheumatoid arthritis with rheumatoid factor of unspecified site: Secondary | ICD-10-CM | POA: Diagnosis not present

## 2022-06-11 DIAGNOSIS — I10 Essential (primary) hypertension: Secondary | ICD-10-CM | POA: Diagnosis not present

## 2022-06-13 DIAGNOSIS — M0510 Rheumatoid lung disease with rheumatoid arthritis of unspecified site: Secondary | ICD-10-CM | POA: Diagnosis not present

## 2022-06-13 DIAGNOSIS — N1831 Chronic kidney disease, stage 3a: Secondary | ICD-10-CM | POA: Diagnosis not present

## 2022-06-13 DIAGNOSIS — M5126 Other intervertebral disc displacement, lumbar region: Secondary | ICD-10-CM | POA: Diagnosis not present

## 2022-06-13 DIAGNOSIS — Z7982 Long term (current) use of aspirin: Secondary | ICD-10-CM | POA: Diagnosis not present

## 2022-06-13 DIAGNOSIS — E119 Type 2 diabetes mellitus without complications: Secondary | ICD-10-CM | POA: Diagnosis not present

## 2022-06-13 DIAGNOSIS — E039 Hypothyroidism, unspecified: Secondary | ICD-10-CM | POA: Diagnosis not present

## 2022-06-13 DIAGNOSIS — M48061 Spinal stenosis, lumbar region without neurogenic claudication: Secondary | ICD-10-CM | POA: Diagnosis not present

## 2022-06-13 DIAGNOSIS — K649 Unspecified hemorrhoids: Secondary | ICD-10-CM | POA: Diagnosis not present

## 2022-06-13 DIAGNOSIS — G4700 Insomnia, unspecified: Secondary | ICD-10-CM | POA: Diagnosis not present

## 2022-06-13 DIAGNOSIS — G8252 Quadriplegia, C1-C4 incomplete: Secondary | ICD-10-CM | POA: Diagnosis not present

## 2022-06-13 DIAGNOSIS — M5020 Other cervical disc displacement, unspecified cervical region: Secondary | ICD-10-CM | POA: Diagnosis not present

## 2022-06-13 DIAGNOSIS — Z981 Arthrodesis status: Secondary | ICD-10-CM | POA: Diagnosis not present

## 2022-06-13 DIAGNOSIS — G3184 Mild cognitive impairment, so stated: Secondary | ICD-10-CM | POA: Diagnosis not present

## 2022-06-13 DIAGNOSIS — Z85528 Personal history of other malignant neoplasm of kidney: Secondary | ICD-10-CM | POA: Diagnosis not present

## 2022-06-13 DIAGNOSIS — I129 Hypertensive chronic kidney disease with stage 1 through stage 4 chronic kidney disease, or unspecified chronic kidney disease: Secondary | ICD-10-CM | POA: Diagnosis not present

## 2022-06-13 DIAGNOSIS — R1310 Dysphagia, unspecified: Secondary | ICD-10-CM | POA: Diagnosis not present

## 2022-06-13 DIAGNOSIS — Z7952 Long term (current) use of systemic steroids: Secondary | ICD-10-CM | POA: Diagnosis not present

## 2022-06-13 DIAGNOSIS — E7800 Pure hypercholesterolemia, unspecified: Secondary | ICD-10-CM | POA: Diagnosis not present

## 2022-06-13 DIAGNOSIS — M5136 Other intervertebral disc degeneration, lumbar region: Secondary | ICD-10-CM | POA: Diagnosis not present

## 2022-06-13 DIAGNOSIS — M5001 Cervical disc disorder with myelopathy,  high cervical region: Secondary | ICD-10-CM | POA: Diagnosis not present

## 2022-06-17 DIAGNOSIS — Z981 Arthrodesis status: Secondary | ICD-10-CM | POA: Diagnosis not present

## 2022-06-17 DIAGNOSIS — Z7952 Long term (current) use of systemic steroids: Secondary | ICD-10-CM | POA: Diagnosis not present

## 2022-06-17 DIAGNOSIS — G8252 Quadriplegia, C1-C4 incomplete: Secondary | ICD-10-CM | POA: Diagnosis not present

## 2022-06-17 DIAGNOSIS — M5020 Other cervical disc displacement, unspecified cervical region: Secondary | ICD-10-CM | POA: Diagnosis not present

## 2022-06-17 DIAGNOSIS — K649 Unspecified hemorrhoids: Secondary | ICD-10-CM | POA: Diagnosis not present

## 2022-06-17 DIAGNOSIS — G4700 Insomnia, unspecified: Secondary | ICD-10-CM | POA: Diagnosis not present

## 2022-06-17 DIAGNOSIS — N1831 Chronic kidney disease, stage 3a: Secondary | ICD-10-CM | POA: Diagnosis not present

## 2022-06-17 DIAGNOSIS — R1310 Dysphagia, unspecified: Secondary | ICD-10-CM | POA: Diagnosis not present

## 2022-06-17 DIAGNOSIS — G3184 Mild cognitive impairment, so stated: Secondary | ICD-10-CM | POA: Diagnosis not present

## 2022-06-17 DIAGNOSIS — Z85528 Personal history of other malignant neoplasm of kidney: Secondary | ICD-10-CM | POA: Diagnosis not present

## 2022-06-17 DIAGNOSIS — Z7982 Long term (current) use of aspirin: Secondary | ICD-10-CM | POA: Diagnosis not present

## 2022-06-17 DIAGNOSIS — E7800 Pure hypercholesterolemia, unspecified: Secondary | ICD-10-CM | POA: Diagnosis not present

## 2022-06-17 DIAGNOSIS — E119 Type 2 diabetes mellitus without complications: Secondary | ICD-10-CM | POA: Diagnosis not present

## 2022-06-17 DIAGNOSIS — M5001 Cervical disc disorder with myelopathy,  high cervical region: Secondary | ICD-10-CM | POA: Diagnosis not present

## 2022-06-17 DIAGNOSIS — M48061 Spinal stenosis, lumbar region without neurogenic claudication: Secondary | ICD-10-CM | POA: Diagnosis not present

## 2022-06-17 DIAGNOSIS — M5126 Other intervertebral disc displacement, lumbar region: Secondary | ICD-10-CM | POA: Diagnosis not present

## 2022-06-17 DIAGNOSIS — I129 Hypertensive chronic kidney disease with stage 1 through stage 4 chronic kidney disease, or unspecified chronic kidney disease: Secondary | ICD-10-CM | POA: Diagnosis not present

## 2022-06-17 DIAGNOSIS — M0510 Rheumatoid lung disease with rheumatoid arthritis of unspecified site: Secondary | ICD-10-CM | POA: Diagnosis not present

## 2022-06-17 DIAGNOSIS — M5136 Other intervertebral disc degeneration, lumbar region: Secondary | ICD-10-CM | POA: Diagnosis not present

## 2022-06-17 DIAGNOSIS — E039 Hypothyroidism, unspecified: Secondary | ICD-10-CM | POA: Diagnosis not present

## 2022-06-24 DIAGNOSIS — E7800 Pure hypercholesterolemia, unspecified: Secondary | ICD-10-CM | POA: Diagnosis not present

## 2022-06-24 DIAGNOSIS — E039 Hypothyroidism, unspecified: Secondary | ICD-10-CM | POA: Diagnosis not present

## 2022-06-24 DIAGNOSIS — Z85528 Personal history of other malignant neoplasm of kidney: Secondary | ICD-10-CM | POA: Diagnosis not present

## 2022-06-24 DIAGNOSIS — G4700 Insomnia, unspecified: Secondary | ICD-10-CM | POA: Diagnosis not present

## 2022-06-24 DIAGNOSIS — M5126 Other intervertebral disc displacement, lumbar region: Secondary | ICD-10-CM | POA: Diagnosis not present

## 2022-06-24 DIAGNOSIS — N1831 Chronic kidney disease, stage 3a: Secondary | ICD-10-CM | POA: Diagnosis not present

## 2022-06-24 DIAGNOSIS — M5001 Cervical disc disorder with myelopathy,  high cervical region: Secondary | ICD-10-CM | POA: Diagnosis not present

## 2022-06-24 DIAGNOSIS — E119 Type 2 diabetes mellitus without complications: Secondary | ICD-10-CM | POA: Diagnosis not present

## 2022-06-24 DIAGNOSIS — R1310 Dysphagia, unspecified: Secondary | ICD-10-CM | POA: Diagnosis not present

## 2022-06-24 DIAGNOSIS — M5136 Other intervertebral disc degeneration, lumbar region: Secondary | ICD-10-CM | POA: Diagnosis not present

## 2022-06-24 DIAGNOSIS — G3184 Mild cognitive impairment, so stated: Secondary | ICD-10-CM | POA: Diagnosis not present

## 2022-06-24 DIAGNOSIS — G8252 Quadriplegia, C1-C4 incomplete: Secondary | ICD-10-CM | POA: Diagnosis not present

## 2022-06-24 DIAGNOSIS — M48061 Spinal stenosis, lumbar region without neurogenic claudication: Secondary | ICD-10-CM | POA: Diagnosis not present

## 2022-06-24 DIAGNOSIS — Z7952 Long term (current) use of systemic steroids: Secondary | ICD-10-CM | POA: Diagnosis not present

## 2022-06-24 DIAGNOSIS — Z981 Arthrodesis status: Secondary | ICD-10-CM | POA: Diagnosis not present

## 2022-06-24 DIAGNOSIS — K649 Unspecified hemorrhoids: Secondary | ICD-10-CM | POA: Diagnosis not present

## 2022-06-24 DIAGNOSIS — M0510 Rheumatoid lung disease with rheumatoid arthritis of unspecified site: Secondary | ICD-10-CM | POA: Diagnosis not present

## 2022-06-24 DIAGNOSIS — M5020 Other cervical disc displacement, unspecified cervical region: Secondary | ICD-10-CM | POA: Diagnosis not present

## 2022-06-24 DIAGNOSIS — I129 Hypertensive chronic kidney disease with stage 1 through stage 4 chronic kidney disease, or unspecified chronic kidney disease: Secondary | ICD-10-CM | POA: Diagnosis not present

## 2022-06-24 DIAGNOSIS — Z7982 Long term (current) use of aspirin: Secondary | ICD-10-CM | POA: Diagnosis not present

## 2022-06-25 DIAGNOSIS — I10 Essential (primary) hypertension: Secondary | ICD-10-CM | POA: Diagnosis not present

## 2022-06-25 DIAGNOSIS — F5101 Primary insomnia: Secondary | ICD-10-CM | POA: Diagnosis not present

## 2022-07-02 DIAGNOSIS — M0580 Other rheumatoid arthritis with rheumatoid factor of unspecified site: Secondary | ICD-10-CM | POA: Diagnosis not present

## 2022-07-02 DIAGNOSIS — F5101 Primary insomnia: Secondary | ICD-10-CM | POA: Diagnosis not present

## 2022-07-04 DIAGNOSIS — I129 Hypertensive chronic kidney disease with stage 1 through stage 4 chronic kidney disease, or unspecified chronic kidney disease: Secondary | ICD-10-CM | POA: Diagnosis not present

## 2022-07-04 DIAGNOSIS — D649 Anemia, unspecified: Secondary | ICD-10-CM | POA: Diagnosis not present

## 2022-07-09 DIAGNOSIS — I10 Essential (primary) hypertension: Secondary | ICD-10-CM | POA: Diagnosis not present

## 2022-07-09 DIAGNOSIS — Z79899 Other long term (current) drug therapy: Secondary | ICD-10-CM | POA: Diagnosis not present

## 2022-07-09 DIAGNOSIS — E559 Vitamin D deficiency, unspecified: Secondary | ICD-10-CM | POA: Diagnosis not present

## 2022-07-09 DIAGNOSIS — F5101 Primary insomnia: Secondary | ICD-10-CM | POA: Diagnosis not present

## 2022-07-16 DIAGNOSIS — D509 Iron deficiency anemia, unspecified: Secondary | ICD-10-CM | POA: Diagnosis not present

## 2022-07-16 DIAGNOSIS — E785 Hyperlipidemia, unspecified: Secondary | ICD-10-CM | POA: Diagnosis not present

## 2022-07-23 DIAGNOSIS — M069 Rheumatoid arthritis, unspecified: Secondary | ICD-10-CM | POA: Diagnosis not present

## 2022-07-23 DIAGNOSIS — M0580 Other rheumatoid arthritis with rheumatoid factor of unspecified site: Secondary | ICD-10-CM | POA: Diagnosis not present

## 2022-08-12 DIAGNOSIS — D531 Other megaloblastic anemias, not elsewhere classified: Secondary | ICD-10-CM | POA: Diagnosis not present

## 2022-08-12 DIAGNOSIS — I129 Hypertensive chronic kidney disease with stage 1 through stage 4 chronic kidney disease, or unspecified chronic kidney disease: Secondary | ICD-10-CM | POA: Diagnosis not present

## 2022-08-13 DIAGNOSIS — E559 Vitamin D deficiency, unspecified: Secondary | ICD-10-CM | POA: Diagnosis not present

## 2022-08-13 DIAGNOSIS — I129 Hypertensive chronic kidney disease with stage 1 through stage 4 chronic kidney disease, or unspecified chronic kidney disease: Secondary | ICD-10-CM | POA: Diagnosis not present

## 2022-08-13 DIAGNOSIS — N1832 Chronic kidney disease, stage 3b: Secondary | ICD-10-CM | POA: Diagnosis not present

## 2022-08-13 DIAGNOSIS — K5900 Constipation, unspecified: Secondary | ICD-10-CM | POA: Diagnosis not present

## 2022-08-22 ENCOUNTER — Emergency Department (HOSPITAL_BASED_OUTPATIENT_CLINIC_OR_DEPARTMENT_OTHER)
Admission: EM | Admit: 2022-08-22 | Discharge: 2022-08-22 | Disposition: A | Discharge status: Home / Self Care | Payer: Medicare Other | Attending: Emergency Medicine | Admitting: Emergency Medicine

## 2022-08-22 ENCOUNTER — Other Ambulatory Visit: Payer: Self-pay

## 2022-08-22 ENCOUNTER — Encounter (HOSPITAL_BASED_OUTPATIENT_CLINIC_OR_DEPARTMENT_OTHER): Payer: Self-pay

## 2022-08-22 DIAGNOSIS — S0501XA Injury of conjunctiva and corneal abrasion without foreign body, right eye, initial encounter: Secondary | ICD-10-CM

## 2022-08-22 DIAGNOSIS — Z743 Need for continuous supervision: Secondary | ICD-10-CM | POA: Diagnosis not present

## 2022-08-22 DIAGNOSIS — R609 Edema, unspecified: Secondary | ICD-10-CM | POA: Diagnosis not present

## 2022-08-22 DIAGNOSIS — X58XXXA Exposure to other specified factors, initial encounter: Secondary | ICD-10-CM | POA: Insufficient documentation

## 2022-08-22 DIAGNOSIS — H579 Unspecified disorder of eye and adnexa: Secondary | ICD-10-CM | POA: Diagnosis not present

## 2022-08-22 DIAGNOSIS — S0502XA Injury of conjunctiva and corneal abrasion without foreign body, left eye, initial encounter: Secondary | ICD-10-CM

## 2022-08-22 DIAGNOSIS — Z7401 Bed confinement status: Secondary | ICD-10-CM | POA: Diagnosis not present

## 2022-08-22 DIAGNOSIS — D649 Anemia, unspecified: Secondary | ICD-10-CM | POA: Insufficient documentation

## 2022-08-22 DIAGNOSIS — S0590XA Unspecified injury of unspecified eye and orbit, initial encounter: Secondary | ICD-10-CM | POA: Diagnosis not present

## 2022-08-22 DIAGNOSIS — R220 Localized swelling, mass and lump, head: Secondary | ICD-10-CM | POA: Diagnosis not present

## 2022-08-22 LAB — CBC WITH DIFFERENTIAL/PLATELET
Abs Immature Granulocytes: 0.03 10*3/uL (ref 0.00–0.07)
Basophils Absolute: 0 10*3/uL (ref 0.0–0.1)
Basophils Relative: 0 %
Eosinophils Absolute: 0.1 10*3/uL (ref 0.0–0.5)
Eosinophils Relative: 2 %
HCT: 35.3 % — ABNORMAL LOW (ref 36.0–46.0)
Hemoglobin: 10.9 g/dL — ABNORMAL LOW (ref 12.0–15.0)
Immature Granulocytes: 0 %
Lymphocytes Relative: 11 %
Lymphs Abs: 0.8 10*3/uL (ref 0.7–4.0)
MCH: 30 pg (ref 26.0–34.0)
MCHC: 30.9 g/dL (ref 30.0–36.0)
MCV: 97.2 fL (ref 80.0–100.0)
Monocytes Absolute: 0.5 10*3/uL (ref 0.1–1.0)
Monocytes Relative: 7 %
Neutro Abs: 5.5 10*3/uL (ref 1.7–7.7)
Neutrophils Relative %: 80 %
Platelets: 153 10*3/uL (ref 150–400)
RBC: 3.63 MIL/uL — ABNORMAL LOW (ref 3.87–5.11)
RDW: 15.4 % (ref 11.5–15.5)
WBC: 7 10*3/uL (ref 4.0–10.5)
nRBC: 0 % (ref 0.0–0.2)

## 2022-08-22 LAB — URINALYSIS, ROUTINE W REFLEX MICROSCOPIC
Bilirubin Urine: NEGATIVE
Glucose, UA: NEGATIVE mg/dL
Hgb urine dipstick: NEGATIVE
Ketones, ur: NEGATIVE mg/dL
Leukocytes,Ua: NEGATIVE
Nitrite: NEGATIVE
Protein, ur: NEGATIVE mg/dL
Specific Gravity, Urine: 1.01 (ref 1.005–1.030)
pH: 7 (ref 5.0–8.0)

## 2022-08-22 LAB — COMPREHENSIVE METABOLIC PANEL
ALT: 44 U/L (ref 0–44)
AST: 34 U/L (ref 15–41)
Albumin: 3.6 g/dL (ref 3.5–5.0)
Alkaline Phosphatase: 102 U/L (ref 38–126)
Anion gap: 8 (ref 5–15)
BUN: 20 mg/dL (ref 8–23)
CO2: 27 mmol/L (ref 22–32)
Calcium: 8.1 mg/dL — ABNORMAL LOW (ref 8.9–10.3)
Chloride: 109 mmol/L (ref 98–111)
Creatinine, Ser: 1.23 mg/dL — ABNORMAL HIGH (ref 0.44–1.00)
GFR, Estimated: 46 mL/min — ABNORMAL LOW (ref >60)
Glucose, Bld: 97 mg/dL (ref 70–99)
Potassium: 4.5 mmol/L (ref 3.5–5.1)
Sodium: 144 mmol/L (ref 135–145)
Total Bilirubin: 0.2 mg/dL — ABNORMAL LOW (ref 0.3–1.2)
Total Protein: 6.2 g/dL — ABNORMAL LOW (ref 6.5–8.1)

## 2022-08-22 MED ORDER — FLUORESCEIN SODIUM 1 MG OP STRP
1.0000 | ORAL_STRIP | Freq: Once | OPHTHALMIC | Status: AC
  Administered 2022-08-22: 1 via OPHTHALMIC
  Filled 2022-08-22: qty 1

## 2022-08-22 MED ORDER — LORATADINE 10 MG PO TABS: 10.0000 mg | ORAL_TABLET | Freq: Every day | ORAL | 0 refills | Status: AC

## 2022-08-22 MED ORDER — POLYMYXIN B-TRIMETHOPRIM 10000-0.1 UNIT/ML-% OP SOLN: 1.0000 [drp] | OPHTHALMIC | 0 refills | Status: DC

## 2022-08-22 MED ORDER — DIPHENHYDRAMINE HCL 25 MG PO CAPS
25.0000 mg | ORAL_CAPSULE | Freq: Once | ORAL | Status: AC
  Administered 2022-08-22: 25 mg via ORAL
  Filled 2022-08-22: qty 1

## 2022-08-22 MED ORDER — TETRACAINE HCL 0.5 % OP SOLN
2.0000 [drp] | Freq: Once | OPHTHALMIC | Status: AC
  Administered 2022-08-22: 2 [drp] via OPHTHALMIC
  Filled 2022-08-22: qty 4

## 2022-08-22 NOTE — ED Provider Notes (Signed)
[facility] Provider Note   CSN: [id number] Arrival date & time: 08/22/22  1340     History Chief Complaint  Patient presents with   Facial Swelling    [name] is a 77 y.o. female with history of RA, diabetes, CKD stage III presents the emergency room today for evaluation of facial swelling.  Patient reports that she woke up with this facial swelling but is mainly around her eyes.  She does report that her eyes do itch a little.  Denies any blurry vision or visual changes.  She denies any fevers or recent cough or cold symptoms.  Denies any dark urine. Denies any new medication changes.     HPI     Home Medications Prior to Admission medications   Medication Sig Start Date End Date Taking? Authorizing Provider  ACCU-CHEK GUIDE test strip Check blood sugar every morning 10/29/17   [provider]  aspirin 81 MG chewable tablet Chew 81 mg by mouth daily. 05/24/21   [provider]  benztropine (COGENTIN) 0.5 MG tablet Take 0.5 mg by mouth 2 (two) times daily.    [provider]  escitalopram (LEXAPRO) 10 MG tablet Take 10 mg by mouth daily.    [provider]  hydroxychloroquine (PLAQUENIL) 200 MG tablet Take 400 mg by mouth 2 (two) times daily.    [provider]  leflunomide (ARAVA) 20 MG tablet Take 20 mg by mouth daily.    [provider]  metoprolol succinate (TOPROL-XL) 25 MG 24 hr tablet Take 25 mg by mouth daily.    [provider]  OLANZapine (ZYPREXA) 2.5 MG tablet Take 2.5 mg by mouth daily.    [provider]  polyethylene glycol (MIRALAX / GLYCOLAX) 17 g packet Take 17 g by mouth 2 (two) times daily. 12/31/20   Regalado, Belkys A, MD  predniSONE (DELTASONE) 5 MG tablet Take 5 mg by mouth daily with breakfast.    [provider]      Allergies    Contrast media [iodinated contrast media], Abilify [aripiprazole], Ultram [tramadol hcl], and  Zithromax [azithromycin]    Review of Systems   Review of Systems  Constitutional:  Negative for chills and fever.  HENT:  Positive for facial swelling. Negative for congestion, rhinorrhea, sore throat and trouble swallowing.   Respiratory:  Negative for cough and shortness of breath.   Cardiovascular:  Negative for chest pain.  Gastrointestinal:  Negative for abdominal pain, nausea and vomiting.  Genitourinary:  Negative for dysuria.    Physical Exam Updated Vital Signs BP (!) 142/82   Pulse 70   Temp 98.4 F (36.9 C)   Resp 16   Ht 5' 3"$  (1.6 m)   Wt 77.1 kg   SpO2 98%   BMI 30.11 kg/m  Physical Exam Vitals and nursing note reviewed.  Constitutional:      General: She is not in acute distress.    Appearance: She is not ill-appearing or toxic-appearing.  HENT:     Right Ear: Tympanic membrane, ear canal and external ear normal.     Left Ear: Tympanic membrane, ear canal and external ear normal.     Nose: Rhinorrhea present.     Mouth/Throat:     Mouth: Mucous membranes are moist.  Eyes:     General: No scleral icterus.    Extraocular Movements: Extraocular movements intact.     Pupils: Pupils are equal, round, and reactive to light.  Comments: Periorbital swelling bilaterally with right greater than left.  No active discharge noted.  She does not have any pain on movement of her eyes.  No crepitus noted to the area.  No increased warmth to the area as well.  PERRLA.  EOMI.  Fluorescein exam shows linear abrasions seen to the lower third of the bilateral eyes with the right being worse than the left.  There is no Seidel sign.  I do not see any herpetic lesions or any ulcerations.  Tono-Pen shows pressures at the left being 24 and the right being 21.  Pulmonary:     Effort: Pulmonary effort is normal. No respiratory distress.  Abdominal:     Palpations: Abdomen is soft.  Musculoskeletal:     Cervical back: Normal range of motion. No rigidity.  Skin:    General: Skin  is warm and dry.  Neurological:     Mental Status: She is alert. Mental status is at baseline.     ED Results / Procedures / Treatments   Labs (all labs ordered are listed, but only abnormal results are displayed) Labs Reviewed  CBC WITH DIFFERENTIAL/PLATELET - Abnormal; Notable for the following components:      Result Value   RBC 3.63 (*)    Hemoglobin 10.9 (*)    HCT 35.3 (*)    All other components within normal limits  COMPREHENSIVE METABOLIC PANEL - Abnormal; Notable for the following components:   Creatinine, Ser 1.23 (*)    Calcium 8.1 (*)    Total Protein 6.2 (*)    Total Bilirubin 0.2 (*)    GFR, Estimated 46 (*)    All other components within normal limits  URINALYSIS, ROUTINE W REFLEX MICROSCOPIC - Abnormal; Notable for the following components:   Color, Urine COLORLESS (*)    All other components within normal limits    EKG None  Radiology No results found.  Procedures Procedures   Medications Ordered in ED Medications  fluorescein ophthalmic strip 1 strip (has no administration in time range)  tetracaine (PONTOCAINE) 0.5 % ophthalmic solution 2 drop (has no administration in time range)    ED Course/ Medical Decision Making/ A&P                            Medical Decision Making Amount and/or Complexity of Data Reviewed Labs: ordered.  Risk OTC drugs. Prescription drug management.   77 year old female presents emerged from today for evaluation of facial swelling.  Differential diagnosis includes but is not limited to bacterial conjunctivitis, viral conjunctivitis, periorbital cellulitis, preseptal cellulitis, nephrotic syndrome, myxedema.  Vital signs show mildly elevated pressure 142/82, afebrile, no pulse rate, satting well room air without increased work breathing.  Physical exam as noted above.  I independently reviewed and interpreted the patient's labs.  CBC shows mild anemia with hemoglobin of 10.9 otherwise no leukocytosis.  Normal  platelets.  Hemoglobin is slightly lower than her baseline around 11.5.  CMP shows creatinine at 1.23 with her creatinine being last year 1.09.  Decrease in calcium and total protein.  Total bili also decreased as well.  No other electrolyte or LFT abnormality.  Urinalysis with colorless urine without any abnormalities.  I likely this is more bacterial conjunctivitis or viral conjunctivitis or an allergic reaction.  She does not have any blurry vision, throat swelling, trouble breathing.  She has no pain on movement of her eyes.  I do not think patient needs any imaging  for periorbital cellulitis.  My attending assessed at bedside and agrees.  Will perform fluorescein exam.  Fluorescein exam shows linear abrasions seen to the lower third of the bilateral eyes with the right being worse than the left.  There is no Seidel sign.  I do not see any herpetic lesions or any ulcerations.  Tono-Pen shows pressures at the left being 24 and the right being 21. The patient denies that she has been scratched in the eye, however she has been rubbing them.   My attending assessed at bedside and recommends polytrim drops. I will also prescribe her some Loratadine for allergies. Information for the ophthalmologist given.   We discussed her new eye drops to be used.  Discussed the need to follow-up with the eye doctor.  She verbalized understanding agrees the plan.  Patient is stable and being discharged home in good condition.  I discussed this case with my attending physician who cosigned this note including patient's presenting symptoms, physical exam, and planned diagnostics and interventions. Attending physician stated agreement with plan or made changes to plan which were implemented.   Attending physician assessed patient at bedside.  Final Clinical Impression(s) / ED Diagnoses Final diagnoses:  Abrasion of left cornea, initial encounter  Abrasion of right cornea, initial encounter    Rx / DC Orders ED  Discharge Orders          Ordered    trimethoprim-polymyxin b (POLYTRIM) ophthalmic solution  Every 4 hours        08/22/22 1948    loratadine (CLARITIN) 10 MG tablet  Daily        08/22/22 1948              Sherrell Puller, PA-C 08/25/22 0100    Drenda Freeze, MD 08/25/22 [phone]

## 2022-08-22 NOTE — ED Notes (Signed)
2nd on PTAR list

## 2022-08-22 NOTE — ED Notes (Signed)
Pt now eating meat loaf tv dinner with applesauce and water - no acute distress.  Bilat perioribtal edema remains visible with erythema to the periorbital region which pt describes as irritating -- PO 72m Benadryl administered (see MAR)

## 2022-08-22 NOTE — ED Notes (Signed)
Late entry -- External female cath d/c'd -- pericare performed and brief applied with disposable scrubs as pt reports her pants she wore today were soiled.  Pt then assisted getting dressed.  Standing by in bed awaiting [facility] transport back to facility

## 2022-08-22 NOTE — ED Notes (Signed)
ED Provider, Dr. Darl Householder, at bedside to re-eval patient prior to d/c

## 2022-08-22 NOTE — ED Notes (Signed)
PTAR has now arrived and pt leaving floor at this time

## 2022-08-22 NOTE — ED Triage Notes (Signed)
Patient BIB [facility] from [facility].  Endorses Facial Swelling that was noted this AM. States she did not wish to come but the Facility Staff requested an Evaluation.   Patient notes some Itching to bilateral Eyes. No SOB. VSS with EMS en Route. No Interventions.   NAD Noted during Triage. A&Ox4. GCS 15. BIB Wheelchair.

## 2022-08-22 NOTE — Discharge Instructions (Addendum)
You were seen in the ER today for evaluation of your facial swelling. Your labwork was reassuring. I likely think this is from your runny nose and nasal congestion. You do have some scratches on your eyes. Because of this, I am prescribing you a drop to use in both your eyes every 4 hours while you are awake. Additionally, I am prescribing you some claritin to help with your symptoms. I would like for you to follow up with the ophthalmologist within the next few days for re-evaluation. If you have any concerns, new or worsening symptoms, please return to the nearest ER for re-evaluation.   Contact a doctor if: You keep having eye pain and other symptoms for more than 2 days. You get new symptoms, such as more redness, watery eyes, or discharge. You have discharge that makes your eyelids stick together in the morning. Your eye patch becomes so loose that you can blink your eye. Symptoms come back after your eye heals. Get help right away if: You have very bad eye pain that does not get better with medicine. You lose eyesight.

## 2022-08-22 NOTE — ED Notes (Addendum)
Pt is alret and oriented. Pt states that her face started to swell last night about 0900.   Pt denis SOB and states that tongue and throat are fine. Pt calm and not in distress   Pt denis N/V, denis headache, denis pain or dizzness. Pt states that she need glasses to see but thinks her vision is fine.  Pt does complain of mild itching around her eyes  Pt stated that EMS gave her bendryl

## 2022-08-26 DIAGNOSIS — I872 Venous insufficiency (chronic) (peripheral): Secondary | ICD-10-CM | POA: Diagnosis not present

## 2022-08-26 DIAGNOSIS — M069 Rheumatoid arthritis, unspecified: Secondary | ICD-10-CM | POA: Diagnosis not present

## 2022-08-26 DIAGNOSIS — I87323 Chronic venous hypertension (idiopathic) with inflammation of bilateral lower extremity: Secondary | ICD-10-CM | POA: Diagnosis not present

## 2022-08-26 DIAGNOSIS — D531 Other megaloblastic anemias, not elsewhere classified: Secondary | ICD-10-CM | POA: Diagnosis not present

## 2022-08-27 DIAGNOSIS — I129 Hypertensive chronic kidney disease with stage 1 through stage 4 chronic kidney disease, or unspecified chronic kidney disease: Secondary | ICD-10-CM | POA: Diagnosis not present

## 2022-08-27 DIAGNOSIS — S0501XA Injury of conjunctiva and corneal abrasion without foreign body, right eye, initial encounter: Secondary | ICD-10-CM | POA: Diagnosis not present

## 2022-08-27 DIAGNOSIS — N1832 Chronic kidney disease, stage 3b: Secondary | ICD-10-CM | POA: Diagnosis not present

## 2022-08-27 DIAGNOSIS — J309 Allergic rhinitis, unspecified: Secondary | ICD-10-CM | POA: Diagnosis not present

## 2022-08-27 DIAGNOSIS — S0502XA Injury of conjunctiva and corneal abrasion without foreign body, left eye, initial encounter: Secondary | ICD-10-CM | POA: Diagnosis not present

## 2022-09-03 DIAGNOSIS — I129 Hypertensive chronic kidney disease with stage 1 through stage 4 chronic kidney disease, or unspecified chronic kidney disease: Secondary | ICD-10-CM | POA: Diagnosis not present

## 2022-09-03 DIAGNOSIS — N1832 Chronic kidney disease, stage 3b: Secondary | ICD-10-CM | POA: Diagnosis not present

## 2022-09-03 DIAGNOSIS — J309 Allergic rhinitis, unspecified: Secondary | ICD-10-CM | POA: Diagnosis not present

## 2022-09-03 DIAGNOSIS — R052 Subacute cough: Secondary | ICD-10-CM | POA: Diagnosis not present

## 2022-09-10 DIAGNOSIS — N1832 Chronic kidney disease, stage 3b: Secondary | ICD-10-CM | POA: Diagnosis not present

## 2022-09-10 DIAGNOSIS — K5900 Constipation, unspecified: Secondary | ICD-10-CM | POA: Diagnosis not present

## 2022-09-10 DIAGNOSIS — F5101 Primary insomnia: Secondary | ICD-10-CM | POA: Diagnosis not present

## 2022-09-10 DIAGNOSIS — C679 Malignant neoplasm of bladder, unspecified: Secondary | ICD-10-CM | POA: Diagnosis not present

## 2022-09-10 DIAGNOSIS — I129 Hypertensive chronic kidney disease with stage 1 through stage 4 chronic kidney disease, or unspecified chronic kidney disease: Secondary | ICD-10-CM | POA: Diagnosis not present

## 2022-09-10 DIAGNOSIS — I739 Peripheral vascular disease, unspecified: Secondary | ICD-10-CM | POA: Diagnosis not present

## 2022-09-17 DIAGNOSIS — M069 Rheumatoid arthritis, unspecified: Secondary | ICD-10-CM | POA: Diagnosis not present

## 2022-09-17 DIAGNOSIS — H109 Unspecified conjunctivitis: Secondary | ICD-10-CM | POA: Diagnosis not present

## 2022-09-17 DIAGNOSIS — L03116 Cellulitis of left lower limb: Secondary | ICD-10-CM | POA: Diagnosis not present

## 2022-09-17 DIAGNOSIS — L03115 Cellulitis of right lower limb: Secondary | ICD-10-CM | POA: Diagnosis not present

## 2022-09-18 DIAGNOSIS — C652 Malignant neoplasm of left renal pelvis: Secondary | ICD-10-CM | POA: Diagnosis not present

## 2022-09-18 DIAGNOSIS — D414 Neoplasm of uncertain behavior of bladder: Secondary | ICD-10-CM | POA: Diagnosis not present

## 2022-09-18 DIAGNOSIS — R8279 Other abnormal findings on microbiological examination of urine: Secondary | ICD-10-CM | POA: Diagnosis not present

## 2022-09-19 ENCOUNTER — Other Ambulatory Visit: Payer: Self-pay | Admitting: Urology

## 2022-09-24 DIAGNOSIS — C679 Malignant neoplasm of bladder, unspecified: Secondary | ICD-10-CM | POA: Diagnosis not present

## 2022-09-24 DIAGNOSIS — S81801A Unspecified open wound, right lower leg, initial encounter: Secondary | ICD-10-CM | POA: Diagnosis not present

## 2022-09-24 DIAGNOSIS — Z85528 Personal history of other malignant neoplasm of kidney: Secondary | ICD-10-CM | POA: Diagnosis not present

## 2022-09-24 DIAGNOSIS — I739 Peripheral vascular disease, unspecified: Secondary | ICD-10-CM | POA: Diagnosis not present

## 2022-09-24 DIAGNOSIS — R600 Localized edema: Secondary | ICD-10-CM | POA: Diagnosis not present

## 2022-09-27 DIAGNOSIS — D631 Anemia in chronic kidney disease: Secondary | ICD-10-CM | POA: Diagnosis not present

## 2022-09-27 DIAGNOSIS — G4700 Insomnia, unspecified: Secondary | ICD-10-CM | POA: Diagnosis not present

## 2022-09-27 DIAGNOSIS — G3184 Mild cognitive impairment, so stated: Secondary | ICD-10-CM | POA: Diagnosis not present

## 2022-09-27 DIAGNOSIS — S81801D Unspecified open wound, right lower leg, subsequent encounter: Secondary | ICD-10-CM | POA: Diagnosis not present

## 2022-09-27 DIAGNOSIS — Z7982 Long term (current) use of aspirin: Secondary | ICD-10-CM | POA: Diagnosis not present

## 2022-09-27 DIAGNOSIS — K5900 Constipation, unspecified: Secondary | ICD-10-CM | POA: Diagnosis not present

## 2022-09-27 DIAGNOSIS — E785 Hyperlipidemia, unspecified: Secondary | ICD-10-CM | POA: Diagnosis not present

## 2022-09-27 DIAGNOSIS — E1151 Type 2 diabetes mellitus with diabetic peripheral angiopathy without gangrene: Secondary | ICD-10-CM | POA: Diagnosis not present

## 2022-09-27 DIAGNOSIS — N183 Chronic kidney disease, stage 3 unspecified: Secondary | ICD-10-CM | POA: Diagnosis not present

## 2022-09-27 DIAGNOSIS — Z993 Dependence on wheelchair: Secondary | ICD-10-CM | POA: Diagnosis not present

## 2022-09-27 DIAGNOSIS — S80822D Blister (nonthermal), left lower leg, subsequent encounter: Secondary | ICD-10-CM | POA: Diagnosis not present

## 2022-09-27 DIAGNOSIS — J309 Allergic rhinitis, unspecified: Secondary | ICD-10-CM | POA: Diagnosis not present

## 2022-09-27 DIAGNOSIS — E559 Vitamin D deficiency, unspecified: Secondary | ICD-10-CM | POA: Diagnosis not present

## 2022-09-27 DIAGNOSIS — Z7952 Long term (current) use of systemic steroids: Secondary | ICD-10-CM | POA: Diagnosis not present

## 2022-09-27 DIAGNOSIS — G894 Chronic pain syndrome: Secondary | ICD-10-CM | POA: Diagnosis not present

## 2022-09-27 DIAGNOSIS — I129 Hypertensive chronic kidney disease with stage 1 through stage 4 chronic kidney disease, or unspecified chronic kidney disease: Secondary | ICD-10-CM | POA: Diagnosis not present

## 2022-09-27 DIAGNOSIS — M4800 Spinal stenosis, site unspecified: Secondary | ICD-10-CM | POA: Diagnosis not present

## 2022-09-27 DIAGNOSIS — E1122 Type 2 diabetes mellitus with diabetic chronic kidney disease: Secondary | ICD-10-CM | POA: Diagnosis not present

## 2022-09-27 DIAGNOSIS — Z8551 Personal history of malignant neoplasm of bladder: Secondary | ICD-10-CM | POA: Diagnosis not present

## 2022-09-27 DIAGNOSIS — M069 Rheumatoid arthritis, unspecified: Secondary | ICD-10-CM | POA: Diagnosis not present

## 2022-10-01 DIAGNOSIS — E1151 Type 2 diabetes mellitus with diabetic peripheral angiopathy without gangrene: Secondary | ICD-10-CM | POA: Diagnosis not present

## 2022-10-01 DIAGNOSIS — G4700 Insomnia, unspecified: Secondary | ICD-10-CM | POA: Diagnosis not present

## 2022-10-01 DIAGNOSIS — S80822D Blister (nonthermal), left lower leg, subsequent encounter: Secondary | ICD-10-CM | POA: Diagnosis not present

## 2022-10-01 DIAGNOSIS — E785 Hyperlipidemia, unspecified: Secondary | ICD-10-CM | POA: Diagnosis not present

## 2022-10-01 DIAGNOSIS — G3184 Mild cognitive impairment, so stated: Secondary | ICD-10-CM | POA: Diagnosis not present

## 2022-10-01 DIAGNOSIS — E559 Vitamin D deficiency, unspecified: Secondary | ICD-10-CM | POA: Diagnosis not present

## 2022-10-01 DIAGNOSIS — S81801D Unspecified open wound, right lower leg, subsequent encounter: Secondary | ICD-10-CM | POA: Diagnosis not present

## 2022-10-01 DIAGNOSIS — F5101 Primary insomnia: Secondary | ICD-10-CM | POA: Diagnosis not present

## 2022-10-01 DIAGNOSIS — I129 Hypertensive chronic kidney disease with stage 1 through stage 4 chronic kidney disease, or unspecified chronic kidney disease: Secondary | ICD-10-CM | POA: Diagnosis not present

## 2022-10-01 DIAGNOSIS — Z7982 Long term (current) use of aspirin: Secondary | ICD-10-CM | POA: Diagnosis not present

## 2022-10-01 DIAGNOSIS — D631 Anemia in chronic kidney disease: Secondary | ICD-10-CM | POA: Diagnosis not present

## 2022-10-01 DIAGNOSIS — N183 Chronic kidney disease, stage 3 unspecified: Secondary | ICD-10-CM | POA: Diagnosis not present

## 2022-10-01 DIAGNOSIS — M069 Rheumatoid arthritis, unspecified: Secondary | ICD-10-CM | POA: Diagnosis not present

## 2022-10-01 DIAGNOSIS — J309 Allergic rhinitis, unspecified: Secondary | ICD-10-CM | POA: Diagnosis not present

## 2022-10-01 DIAGNOSIS — K5900 Constipation, unspecified: Secondary | ICD-10-CM | POA: Diagnosis not present

## 2022-10-01 DIAGNOSIS — N1832 Chronic kidney disease, stage 3b: Secondary | ICD-10-CM | POA: Diagnosis not present

## 2022-10-01 DIAGNOSIS — Z8551 Personal history of malignant neoplasm of bladder: Secondary | ICD-10-CM | POA: Diagnosis not present

## 2022-10-01 DIAGNOSIS — E1122 Type 2 diabetes mellitus with diabetic chronic kidney disease: Secondary | ICD-10-CM | POA: Diagnosis not present

## 2022-10-01 DIAGNOSIS — G894 Chronic pain syndrome: Secondary | ICD-10-CM | POA: Diagnosis not present

## 2022-10-01 DIAGNOSIS — M4800 Spinal stenosis, site unspecified: Secondary | ICD-10-CM | POA: Diagnosis not present

## 2022-10-01 DIAGNOSIS — Z993 Dependence on wheelchair: Secondary | ICD-10-CM | POA: Diagnosis not present

## 2022-10-01 DIAGNOSIS — Z7952 Long term (current) use of systemic steroids: Secondary | ICD-10-CM | POA: Diagnosis not present

## 2022-10-01 NOTE — Progress Notes (Addendum)
Patient has questions regarding procedure. Sign consent DOS  COVID Vaccine Completed: yes  Date of COVID positive in last 90 days: few months   PCP - Milagros Evener, MD Cardiologist - hasn't seen in awhile, CP  Chest x-ray - n/a EKG - 10/02/22 Epic/chart Stress Test - yes, 25 years ago ECHO - 12/28/20 Epic Cardiac Cath - n/a Pacemaker/ICD device last checked: n/a Spinal Cord Stimulator: n/a  Bowel Prep - no  Sleep Study - yes CPAP - no  Fasting Blood Sugar - diet controlled Checks Blood Sugar  no checks  Last dose of GLP1 agonist-  N/A GLP1 instructions:  N/A   Last dose of SGLT-2 inhibitors-  N/A SGLT-2 instructions: N/A   Blood Thinner Instructions: Aspirin Instructions: ASA 81, no instructions Last Dose:  Activity level: Can perform activities of daily living without stopping and without symptoms of chest pain or shortness of breath. No stairs due to legs  Anesthesia review: leg rash bilateral lower extermities, HTN, DM2  Patient denies shortness of breath, fever, cough and chest pain at PAT appointment  Patient verbalized understanding of instructions that were given to them at the PAT appointment. Patient was also instructed that they will need to review over the PAT instructions again at home before surgery.

## 2022-10-01 NOTE — Patient Instructions (Addendum)
SURGICAL WAITING ROOM VISITATION  Patients having surgery or a procedure may have no more than 2 support people in the waiting area - these visitors may rotate.    Children under the age of 67 must have an adult with them who is not the patient.  Due to an increase in RSV and influenza rates and associated hospitalizations, children ages 41 and under may not visit patients in [place].  If the patient needs to stay at the hospital during part of their recovery, the visitor guidelines for inpatient rooms apply. Pre-op nurse will coordinate an appropriate time for 1 support person to accompany patient in pre-op.  This support person may not rotate.    Please refer to the [facility] website for the visitor guidelines for Inpatients (after your surgery is over and you are in a regular room).    Your procedure is scheduled on: 10/07/22   Report to [facility] Main Entrance    Report to admitting at 7:45 AM   Call this number if you have problems the morning of surgery [phone]   Do not eat food or drink liquids :After Midnight.          If you have questions, please contact your surgeon's office.   FOLLOW BOWEL PREP AND ANY ADDITIONAL PRE OP INSTRUCTIONS YOU RECEIVED FROM YOUR SURGEON'S OFFICE!!!     Oral Hygiene is also important to reduce your risk of infection.                                    Remember - BRUSH YOUR TEETH THE MORNING OF SURGERY WITH YOUR REGULAR TOOTHPASTE  DENTURES WILL BE REMOVED PRIOR TO SURGERY PLEASE DO NOT APPLY "Poly grip" OR ADHESIVES!!!   Take these medicines the morning of surgery with A SIP OF WATER: Benztrioine, Escitalopram, Claritin, Metoprolol, Zyprexa, Prednisone, Tylenol, Hydrocodone, Inhaler  DO NOT TAKE ANY ORAL DIABETIC MEDICATIONS DAY OF YOUR SURGERY  How to Manage Your Diabetes Before and After Surgery  Why is it important to control my blood sugar before and after surgery? Improving blood sugar levels before and  after surgery helps healing and can limit problems. A way of improving blood sugar control is eating a healthy diet by:  Eating less sugar and carbohydrates  Increasing activity/exercise  Talking with your doctor about reaching your blood sugar goals High blood sugars (greater than 180 mg/dL) can raise your risk of infections and slow your recovery, so you will need to focus on controlling your diabetes during the weeks before surgery. Make sure that the doctor who takes care of your diabetes knows about your planned surgery including the date and location.  How do I manage my blood sugar before surgery? Check your blood sugar at least 4 times a day, starting 2 days before surgery, to make sure that the level is not too high or low. Check your blood sugar the morning of your surgery when you wake up and every 2 hours until you get to the Short Stay unit. If your blood sugar is less than 70 mg/dL, you will need to treat for low blood sugar: Do not take insulin. Treat a low blood sugar (less than 70 mg/dL) with  cup of clear juice (cranberry or apple), 4 glucose tablets, OR glucose gel. Recheck blood sugar in 15 minutes after treatment (to make sure it is greater than 70 mg/dL). If your blood  sugar is not greater than 70 mg/dL on recheck, call [phone] for further instructions. Report your blood sugar to the short stay nurse when you get to Short Stay.  If you are admitted to the hospital after surgery: Your blood sugar will be checked by the staff and you will probably be given insulin after surgery (instead of oral diabetes medicines) to make sure you have good blood sugar levels. The goal for blood sugar control after surgery is 80-180 mg/dL.  Reviewed and Endorsed by [facility] Patient Education Committee, August 2015                              You may not have any metal on your body including hair pins, jewelry, and body piercing             Do not wear make-up, lotions, powders,  perfumes, or deodorant  Do not wear nail polish including gel and S&S, artificial/acrylic nails, or any other type of covering on natural nails including finger and toenails. If you have artificial nails, gel coating, etc. that needs to be removed by a nail salon please have this removed prior to surgery or surgery may need to be canceled/ delayed if the surgeon/ anesthesia feels like they are unable to be safely monitored.   Do not shave  48 hours prior to surgery.    Do not bring valuables to the hospital. [place].   Contacts, glasses, dentures or bridgework may not be worn into surgery.  DO NOT [place]. PHARMACY WILL DISPENSE MEDICATIONS LISTED ON YOUR MEDICATION LIST TO YOU DURING YOUR ADMISSION [place]!    Patients discharged on the day of surgery will not be allowed to drive home.  Someone NEEDS to stay with you for the first 24 hours after anesthesia.              Please read over the following fact sheets you were given: IF [place] [phone] Schneiders    If you received a COVID test during your pre-op visit  it is requested that you wear a mask when out in public, stay away from anyone that may not be feeling well and notify your surgeon if you develop symptoms. If you test positive for Covid or have been in contact with anyone that has tested positive in the last 10 days please notify you surgeon.    [place] - Preparing for Surgery Before surgery, you can play an important role.  Because skin is not sterile, your skin needs to be as free of germs as possible.  You can reduce the number of germs on your skin by washing with CHG (chlorahexidine gluconate) soap before surgery.  CHG is an antiseptic cleaner which kills germs and bonds with the skin to continue killing germs even after washing. Please DO NOT use if you have an allergy to  CHG or antibacterial soaps.  If your skin becomes reddened/irritated stop using the CHG and inform your nurse when you arrive at Short Stay. Do not shave (including legs and underarms) for at least 48 hours prior to the first CHG shower.  You may shave your face/neck.  Please follow these instructions carefully:  1.  Shower with CHG Soap the night before surgery  and the  morning of surgery.  2.  If you choose to wash your hair, wash your hair first as usual with your normal  shampoo.  3.  After you shampoo, rinse your hair and body thoroughly to remove the shampoo.                             4.  Use CHG as you would any other liquid soap.  You can apply chg directly to the skin and wash.  Gently with a scrungie or clean washcloth.  5.  Apply the CHG Soap to your body ONLY FROM THE NECK DOWN.   Do   not use on face/ open                           Wound or open sores. Avoid contact with eyes, ears mouth and   genitals (private parts).                       Wash face,  Genitals (private parts) with your normal soap.             6.  Wash thoroughly, paying special attention to the area where your    surgery  will be performed.  7.  Thoroughly rinse your body with warm water from the neck down.  8.  DO NOT shower/wash with your normal soap after using and rinsing off the CHG Soap.                9.  Pat yourself dry with a clean towel.            10.  Wear clean pajamas.            11.  Place clean sheets on your bed the night of your first shower and do not  sleep with pets. Day of Surgery : Do not apply any lotions/deodorants the morning of surgery.  Please wear clean clothes to the hospital/surgery center.  FAILURE TO FOLLOW THESE INSTRUCTIONS MAY RESULT IN THE CANCELLATION OF YOUR SURGERY  PATIENT SIGNATURE_________________________________  NURSE SIGNATURE__________________________________  ________________________________________________________________________

## 2022-10-02 ENCOUNTER — Encounter (HOSPITAL_COMMUNITY)
Admission: RE | Admit: 2022-10-02 | Discharge: 2022-10-02 | Disposition: A | Discharge status: Home / Self Care | Payer: Medicare Other | Source: Ambulatory Visit | Attending: Urology | Admitting: Urology

## 2022-10-02 ENCOUNTER — Other Ambulatory Visit: Payer: Self-pay

## 2022-10-02 ENCOUNTER — Encounter (HOSPITAL_COMMUNITY): Payer: Self-pay

## 2022-10-02 VITALS — BP 129/55 | HR 76 | Temp 97.8°F | Resp 16 | Ht 63.0 in | Wt 163.0 lb

## 2022-10-02 DIAGNOSIS — E119 Type 2 diabetes mellitus without complications: Secondary | ICD-10-CM | POA: Diagnosis not present

## 2022-10-02 DIAGNOSIS — Z01818 Encounter for other preprocedural examination: Secondary | ICD-10-CM | POA: Diagnosis not present

## 2022-10-02 DIAGNOSIS — I10 Essential (primary) hypertension: Secondary | ICD-10-CM | POA: Diagnosis not present

## 2022-10-02 HISTORY — DX: Unspecified asthma, uncomplicated: J45.909

## 2022-10-02 HISTORY — DX: Anemia, unspecified: D64.9

## 2022-10-02 HISTORY — DX: Malignant (primary) neoplasm, unspecified: C80.1

## 2022-10-02 HISTORY — DX: Pneumonia, unspecified organism: J18.9

## 2022-10-02 HISTORY — DX: Gastro-esophageal reflux disease without esophagitis: K21.9

## 2022-10-02 LAB — CBC
HCT: 34.7 % — ABNORMAL LOW (ref 36.0–46.0)
Hemoglobin: 10.6 g/dL — ABNORMAL LOW (ref 12.0–15.0)
MCH: 30.2 pg (ref 26.0–34.0)
MCHC: 30.5 g/dL (ref 30.0–36.0)
MCV: 98.9 fL (ref 80.0–100.0)
Platelets: 170 10*3/uL (ref 150–400)
RBC: 3.51 MIL/uL — ABNORMAL LOW (ref 3.87–5.11)
RDW: 14.9 % (ref 11.5–15.5)
WBC: 9.4 10*3/uL (ref 4.0–10.5)
nRBC: 0 % (ref 0.0–0.2)

## 2022-10-02 LAB — BASIC METABOLIC PANEL
Anion gap: 9 (ref 5–15)
BUN: 25 mg/dL — ABNORMAL HIGH (ref 8–23)
CO2: 24 mmol/L (ref 22–32)
Calcium: 7.8 mg/dL — ABNORMAL LOW (ref 8.9–10.3)
Chloride: 106 mmol/L (ref 98–111)
Creatinine, Ser: 1.19 mg/dL — ABNORMAL HIGH (ref 0.44–1.00)
GFR, Estimated: 47 mL/min — ABNORMAL LOW (ref >60)
Glucose, Bld: 98 mg/dL (ref 70–99)
Potassium: 4 mmol/L (ref 3.5–5.1)
Sodium: 139 mmol/L (ref 135–145)

## 2022-10-02 LAB — GLUCOSE, CAPILLARY: Glucose-Capillary: 95 mg/dL (ref 70–99)

## 2022-10-03 DIAGNOSIS — N183 Chronic kidney disease, stage 3 unspecified: Secondary | ICD-10-CM | POA: Diagnosis not present

## 2022-10-03 DIAGNOSIS — D631 Anemia in chronic kidney disease: Secondary | ICD-10-CM | POA: Diagnosis not present

## 2022-10-03 DIAGNOSIS — M4800 Spinal stenosis, site unspecified: Secondary | ICD-10-CM | POA: Diagnosis not present

## 2022-10-03 DIAGNOSIS — E1122 Type 2 diabetes mellitus with diabetic chronic kidney disease: Secondary | ICD-10-CM | POA: Diagnosis not present

## 2022-10-03 DIAGNOSIS — Z993 Dependence on wheelchair: Secondary | ICD-10-CM | POA: Diagnosis not present

## 2022-10-03 DIAGNOSIS — Z8551 Personal history of malignant neoplasm of bladder: Secondary | ICD-10-CM | POA: Diagnosis not present

## 2022-10-03 DIAGNOSIS — S80822D Blister (nonthermal), left lower leg, subsequent encounter: Secondary | ICD-10-CM | POA: Diagnosis not present

## 2022-10-03 DIAGNOSIS — K5900 Constipation, unspecified: Secondary | ICD-10-CM | POA: Diagnosis not present

## 2022-10-03 DIAGNOSIS — E1151 Type 2 diabetes mellitus with diabetic peripheral angiopathy without gangrene: Secondary | ICD-10-CM | POA: Diagnosis not present

## 2022-10-03 DIAGNOSIS — G894 Chronic pain syndrome: Secondary | ICD-10-CM | POA: Diagnosis not present

## 2022-10-03 DIAGNOSIS — I129 Hypertensive chronic kidney disease with stage 1 through stage 4 chronic kidney disease, or unspecified chronic kidney disease: Secondary | ICD-10-CM | POA: Diagnosis not present

## 2022-10-03 DIAGNOSIS — E785 Hyperlipidemia, unspecified: Secondary | ICD-10-CM | POA: Diagnosis not present

## 2022-10-03 DIAGNOSIS — E559 Vitamin D deficiency, unspecified: Secondary | ICD-10-CM | POA: Diagnosis not present

## 2022-10-03 DIAGNOSIS — G3184 Mild cognitive impairment, so stated: Secondary | ICD-10-CM | POA: Diagnosis not present

## 2022-10-03 DIAGNOSIS — M069 Rheumatoid arthritis, unspecified: Secondary | ICD-10-CM | POA: Diagnosis not present

## 2022-10-03 DIAGNOSIS — Z7952 Long term (current) use of systemic steroids: Secondary | ICD-10-CM | POA: Diagnosis not present

## 2022-10-03 DIAGNOSIS — J309 Allergic rhinitis, unspecified: Secondary | ICD-10-CM | POA: Diagnosis not present

## 2022-10-03 DIAGNOSIS — Z7982 Long term (current) use of aspirin: Secondary | ICD-10-CM | POA: Diagnosis not present

## 2022-10-03 DIAGNOSIS — G4700 Insomnia, unspecified: Secondary | ICD-10-CM | POA: Diagnosis not present

## 2022-10-03 DIAGNOSIS — S81801D Unspecified open wound, right lower leg, subsequent encounter: Secondary | ICD-10-CM | POA: Diagnosis not present

## 2022-10-03 LAB — HEMOGLOBIN A1C
Hgb A1c MFr Bld: 5.7 % — ABNORMAL HIGH (ref 4.8–5.6)
Mean Plasma Glucose: 117 mg/dL

## 2022-10-07 ENCOUNTER — Ambulatory Visit (HOSPITAL_BASED_OUTPATIENT_CLINIC_OR_DEPARTMENT_OTHER): Payer: Medicare Other | Admitting: Anesthesiology

## 2022-10-07 ENCOUNTER — Encounter (HOSPITAL_COMMUNITY): Payer: Self-pay | Admitting: Urology

## 2022-10-07 ENCOUNTER — Other Ambulatory Visit: Payer: Self-pay

## 2022-10-07 ENCOUNTER — Ambulatory Visit (HOSPITAL_COMMUNITY): Payer: Medicare Other | Admitting: Physician Assistant

## 2022-10-07 ENCOUNTER — Encounter (HOSPITAL_COMMUNITY)
Admission: RE | Disposition: A | Discharge status: Hospice | Payer: Self-pay | Source: Home / Self Care | Attending: Urology

## 2022-10-07 ENCOUNTER — Ambulatory Visit (HOSPITAL_COMMUNITY)
Admission: RE | Admit: 2022-10-07 | Discharge: 2022-10-07 | Disposition: A | Discharge status: Hospice | Payer: Medicare Other | Attending: Urology | Admitting: Urology

## 2022-10-07 DIAGNOSIS — Z803 Family history of malignant neoplasm of breast: Secondary | ICD-10-CM | POA: Insufficient documentation

## 2022-10-07 DIAGNOSIS — Z8554 Personal history of malignant neoplasm of ureter: Secondary | ICD-10-CM | POA: Diagnosis not present

## 2022-10-07 DIAGNOSIS — Z841 Family history of disorders of kidney and ureter: Secondary | ICD-10-CM | POA: Insufficient documentation

## 2022-10-07 DIAGNOSIS — E039 Hypothyroidism, unspecified: Secondary | ICD-10-CM | POA: Diagnosis not present

## 2022-10-07 DIAGNOSIS — Z905 Acquired absence of kidney: Secondary | ICD-10-CM | POA: Diagnosis not present

## 2022-10-07 DIAGNOSIS — Z8553 Personal history of malignant neoplasm of renal pelvis: Secondary | ICD-10-CM | POA: Insufficient documentation

## 2022-10-07 DIAGNOSIS — F32A Depression, unspecified: Secondary | ICD-10-CM | POA: Diagnosis not present

## 2022-10-07 DIAGNOSIS — I10 Essential (primary) hypertension: Secondary | ICD-10-CM

## 2022-10-07 DIAGNOSIS — D494 Neoplasm of unspecified behavior of bladder: Secondary | ICD-10-CM | POA: Diagnosis not present

## 2022-10-07 DIAGNOSIS — K219 Gastro-esophageal reflux disease without esophagitis: Secondary | ICD-10-CM | POA: Diagnosis not present

## 2022-10-07 DIAGNOSIS — J45909 Unspecified asthma, uncomplicated: Secondary | ICD-10-CM | POA: Diagnosis not present

## 2022-10-07 DIAGNOSIS — F419 Anxiety disorder, unspecified: Secondary | ICD-10-CM | POA: Insufficient documentation

## 2022-10-07 DIAGNOSIS — F418 Other specified anxiety disorders: Secondary | ICD-10-CM

## 2022-10-07 DIAGNOSIS — M1990 Unspecified osteoarthritis, unspecified site: Secondary | ICD-10-CM | POA: Insufficient documentation

## 2022-10-07 DIAGNOSIS — E119 Type 2 diabetes mellitus without complications: Secondary | ICD-10-CM | POA: Insufficient documentation

## 2022-10-07 DIAGNOSIS — C679 Malignant neoplasm of bladder, unspecified: Secondary | ICD-10-CM

## 2022-10-07 DIAGNOSIS — C671 Malignant neoplasm of dome of bladder: Secondary | ICD-10-CM | POA: Insufficient documentation

## 2022-10-07 HISTORY — PX: CYSTOSCOPY: SHX5120

## 2022-10-07 LAB — GLUCOSE, CAPILLARY
Glucose-Capillary: 92 mg/dL (ref 70–99)
Glucose-Capillary: 116 mg/dL — ABNORMAL HIGH (ref 70–99)

## 2022-10-07 SURGERY — TURBT, WITH CHEMOTHERAPEUTIC AGENT INSTILLATION INTO BLADDER
Anesthesia: General

## 2022-10-07 MED ORDER — ONDANSETRON HCL 4 MG/2ML IJ SOLN: 4.0000 mg | Freq: Four times a day (QID) | INTRAMUSCULAR | Status: DC | PRN

## 2022-10-07 MED ORDER — CEFAZOLIN SODIUM-DEXTROSE 2-4 GM/100ML-% IV SOLN
2.0000 g | INTRAVENOUS | Status: AC
  Administered 2022-10-07: 2 g via INTRAVENOUS
  Filled 2022-10-07: qty 100

## 2022-10-07 MED ORDER — ONDANSETRON HCL 4 MG/2ML IJ SOLN
INTRAMUSCULAR | Status: DC | PRN
  Administered 2022-10-07: 4 mg via INTRAVENOUS

## 2022-10-07 MED ORDER — LACTATED RINGERS IV SOLN: INTRAVENOUS | Status: DC

## 2022-10-07 MED ORDER — KETOROLAC TROMETHAMINE 30 MG/ML IJ SOLN
INTRAMUSCULAR | Status: AC
  Filled 2022-10-07: qty 1

## 2022-10-07 MED ORDER — MIDAZOLAM HCL 2 MG/2ML IJ SOLN
INTRAMUSCULAR | Status: AC
  Filled 2022-10-07: qty 2

## 2022-10-07 MED ORDER — ORAL CARE MOUTH RINSE: 15.0000 mL | Freq: Once | OROMUCOSAL | Status: DC

## 2022-10-07 MED ORDER — DEXAMETHASONE SODIUM PHOSPHATE 10 MG/ML IJ SOLN
INTRAMUSCULAR | Status: AC
  Filled 2022-10-07: qty 1

## 2022-10-07 MED ORDER — PROPOFOL 10 MG/ML IV BOLUS
INTRAVENOUS | Status: AC
  Filled 2022-10-07: qty 20

## 2022-10-07 MED ORDER — CHLORHEXIDINE GLUCONATE 0.12 % MT SOLN: 15.0000 mL | Freq: Once | OROMUCOSAL | Status: DC

## 2022-10-07 MED ORDER — FENTANYL CITRATE (PF) 100 MCG/2ML IJ SOLN
INTRAMUSCULAR | Status: AC
  Filled 2022-10-07: qty 2

## 2022-10-07 MED ORDER — OXYCODONE HCL 5 MG PO TABS: 5.0000 mg | ORAL_TABLET | Freq: Once | ORAL | Status: DC | PRN

## 2022-10-07 MED ORDER — GEMCITABINE CHEMO FOR BLADDER INSTILLATION 2000 MG
2000.0000 mg | Freq: Once | INTRAVENOUS | Status: AC
  Administered 2022-10-07: 2000 mg via INTRAVESICAL
  Filled 2022-10-07: qty 2000

## 2022-10-07 MED ORDER — DEXAMETHASONE SODIUM PHOSPHATE 10 MG/ML IJ SOLN
INTRAMUSCULAR | Status: DC | PRN
  Administered 2022-10-07: 4 mg via INTRAVENOUS

## 2022-10-07 MED ORDER — PROPOFOL 10 MG/ML IV BOLUS
INTRAVENOUS | Status: DC | PRN
  Administered 2022-10-07: 100 mg via INTRAVENOUS

## 2022-10-07 MED ORDER — ONDANSETRON HCL 4 MG/2ML IJ SOLN
INTRAMUSCULAR | Status: AC
  Filled 2022-10-07: qty 2

## 2022-10-07 MED ORDER — LIDOCAINE HCL (PF) 2 % IJ SOLN
INTRAMUSCULAR | Status: DC | PRN
  Administered 2022-10-07: 60 mg via INTRADERMAL

## 2022-10-07 MED ORDER — FENTANYL CITRATE (PF) 100 MCG/2ML IJ SOLN
INTRAMUSCULAR | Status: DC | PRN
  Administered 2022-10-07 (×2): 25 ug via INTRAVENOUS

## 2022-10-07 MED ORDER — FENTANYL CITRATE PF 50 MCG/ML IJ SOSY: 25.0000 ug | PREFILLED_SYRINGE | INTRAMUSCULAR | Status: DC | PRN

## 2022-10-07 MED ORDER — LIDOCAINE HCL (PF) 2 % IJ SOLN
INTRAMUSCULAR | Status: AC
  Filled 2022-10-07: qty 5

## 2022-10-07 MED ORDER — OXYCODONE HCL 5 MG/5ML PO SOLN: 5.0000 mg | Freq: Once | ORAL | Status: DC | PRN

## 2022-10-07 SURGICAL SUPPLY — 13 items
BAG URO CATCHER STRL LF (MISCELLANEOUS) ×2 IMPLANT
CATH FOLEY 2WAY SLVR  5CC 16FR (CATHETERS) ×1
CATH FOLEY 2WAY SLVR 5CC 16FR (CATHETERS) IMPLANT
CATH URETL OPEN 5X70 (CATHETERS) ×2 IMPLANT
CLOTH BEACON ORANGE TIMEOUT ST (SAFETY) ×2 IMPLANT
GLOVE SURG LX STRL 7.5 STRW (GLOVE) ×2 IMPLANT
GOWN STRL REUS W/ TWL XL LVL3 (GOWN DISPOSABLE) ×2 IMPLANT
GOWN STRL REUS W/TWL XL LVL3 (GOWN DISPOSABLE) ×1
GUIDEWIRE ZIPWRE .038 STRAIGHT (WIRE) ×2 IMPLANT
KIT TURNOVER KIT A (KITS) IMPLANT
MANIFOLD NEPTUNE II (INSTRUMENTS) ×2 IMPLANT
PACK CYSTO (CUSTOM PROCEDURE TRAY) ×2 IMPLANT
TUBING CONNECTING 10 (TUBING) ×2 IMPLANT

## 2022-10-07 NOTE — Anesthesia Preprocedure Evaluation (Signed)
Anesthesia Evaluation  Patient identified by MRN, date of birth, ID band Patient awake    Reviewed: Allergy & Precautions, H&P , NPO status , Patient's Chart, lab work & pertinent test results  Airway Mallampati: II   Neck ROM: full    Dental   Pulmonary shortness of breath, asthma    breath sounds clear to auscultation       Cardiovascular hypertension,  Rhythm:regular Rate:Normal     Neuro/Psych  PSYCHIATRIC DISORDERS Anxiety Depression       GI/Hepatic ,GERD  ,,  Endo/Other  diabetes, Type 2Hypothyroidism    Renal/GU    Bladder tumor    Musculoskeletal  (+) Arthritis ,    Abdominal   Peds  Hematology   Anesthesia Other Findings   Reproductive/Obstetrics                             Anesthesia Physical Anesthesia Plan  ASA: 3  Anesthesia Plan: General   Post-op Pain Management:    Induction: Intravenous  PONV Risk Score and Plan: 3 and Ondansetron, Dexamethasone and Treatment may vary due to age or medical condition  Airway Management Planned: LMA  Additional Equipment:   Intra-op Plan:   Post-operative Plan: Extubation in OR  Informed Consent: I have reviewed the patients History and Physical, chart, labs and discussed the procedure including the risks, benefits and alternatives for the proposed anesthesia with the patient or authorized representative who has indicated his/her understanding and acceptance.     Dental advisory given  Plan Discussed with: CRNA, Anesthesiologist and Surgeon  Anesthesia Plan Comments:        Anesthesia Quick Evaluation

## 2022-10-07 NOTE — H&P (Signed)
PRE-Op H&P  Office Visit Report     09/18/2022   --------------------------------------------------------------------------------   [name]  MRN: [id number]  DOB: [birth year], 77 year old Female  SSN:    PRIMARY CARE:  [place] R. Rankins, MD  PRIMARY CARE FAX:  [phone]  REFERRING:  Maryan Char, MD  PROVIDER:  Ellison Hughs, M.D.  LOCATION:  [facility] 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   01/04/21: The patient is here today for a routine follow-up. Currently receiving rehab at a SNF. She is recovering well from surgery, which was complicated by re-intubation in the PACU due to acute respiration failure thought to be 2/2 volume overload from CHF. She reports mild incision pain and LE edema (which was present prior to surgery--negative LE dopplers pre-op). She is amublating with a walker, passing flatus and having regular bowel movements. She is also urinating w/o difficulty and dysuria or hematuria.   02/04/2021: 77 year old female that presents today for post surgical clearance after she underwent left sided nephrectomy. She reports she has he recovering much better and is no longer needing the use of a walker. She is slowly regaining her strength. She denies interval fevers and chills. She reports some brown color discharge that she has noticed on her underwear and pad and she is not sure where this is coming from. She refuses to allow me to look at this today. She denies any difficulty with urination. She denies dysuria. She is slowly regaining her strength.   05/09/2021: The patient is here today for a routine follow-up. She was unable to have her surveillance CT scan due to her inability to lay flat for a prolonged  period of time. I have tried giving her an oxycodone to help with the pain associated with laying flat, but she could not tolerate the study. She has a renal ultrasound pending. She reports 1 episode of gross hematuria since her last visit. She denies interval episodes of dysuria or UTIs. She is voiding without difficulty and has no urinary complaints today.   09/12/21: The patient is here today for a routine follow-up and surveillance cystoscopy. She denies interval UTIs, dysuria or hematuria.   04/04/22: The patient is here today for a routine follow-up and surveillance cystoscopy. She is currently residing at an assisted living facility. She is recovering from a recent bout of pneumonia. No GU complaints today. Denies interval UTIs, dysuria or hematuria.   09/18/22: The patient is here today for routine surveillance cystoscopy. She is currently on a course of cephalexin for lower extremity cellulitis. From a urinary standpoint, she denies interval UTIs, dysuria or hematuria.     ALLERGIES: Morphine Sulfate - Nausea     Notes: allergy to dye given for retinal check   MEDICATIONS: Aspirin 81 mg tablet,chewable  Doxycycline Hyclate  Hydrocodone-Acetaminophen 7.5 mg-325 mg tablet 1 tablet PO As Directed Take one hour prior to your scheduled CT scan. You must have a driver the day of the CT.  Metoprolol Succinate 25 mg tablet, extended release 24 hr  Benztropine Mesylate 0.5 mg tablet  Escitalopram Oxalate 10 mg tablet  Hydroxychloroquine Sulfate 400 mg tablet  Lactulose  Leflunomide 20 mg tablet  Melatonin  Miralax  Mucinex  Olanzapine 2.5 mg tablet  Prednisone 5 mg tablet  Systane  Vitamin  C     GU PSH: Cysto Remove Stent FB Sim - 10/12/2020 Cysto Uretero Biopsy Fulgura, Left - 10/03/2020 Cystoscopy - 04/04/2022, 09/12/2021, 05/09/2021, 09/19/2020 Cystoscopy Insert Stent, Left - 10/03/2020 Hysterectomy - about 1987 Lap Nephro Ureterectomy - 12/26/2020 Locm 300-399Mg /Ml Iodine,1Ml - 2022        PSH Notes: partial thyroidectomy   NON-GU PSH: Back surgery Neck Surgery Thyroid Surgery - about 1987     GU PMH: Renal pelvis cancer, left - 04/04/2022, - 09/12/2021, - 05/09/2021 (Stable), - 01/04/2021, - 12/18/2020, - 10/22/2020 Left renal neoplasm - 02/04/2021, - [id number] Left uncertain neoplasm of kidney - 09/19/2020 Gross hematuria - 2022, - 2022    NON-GU PMH: Pyuria/other UA findings (Stable) - 04/04/2022, - 09/12/2021 Anxiety Arthritis Depression Hypercholesterolemia Hypertension Hyperthyroidism Hypothyroidism    FAMILY HISTORY: Alzheimer's Disease - Father Breast Cancer - Mother, Sister Diabetes - Mother, Brother Heart Disease - Runs in Family Kidney Failure - Brother   SOCIAL HISTORY: Marital Status: Widowed Preferred Language: English; Race: White Current Smoking Status: Patient has never smoked.   Tobacco Use Assessment Completed: Used Tobacco in last 30 days? Does not use smokeless tobacco. Has never drank.  Does not use drugs. Drinks 1 caffeinated drink per day. Has not had a blood transfusion. Patient's occupation is/was retired.    REVIEW OF SYSTEMS:    GU Review Female:   Patient denies frequent urination, hard to postpone urination, burning /pain with urination, get up at night to urinate, leakage of urine, stream starts and stops, trouble starting your stream, have to strain to urinate, and being pregnant.  Gastrointestinal (Upper):   Patient denies nausea, vomiting, and indigestion/ heartburn.  Gastrointestinal (Lower):   Patient denies diarrhea and constipation.  Constitutional:   Patient denies fever, night sweats, weight loss, and fatigue.  Skin:   Patient denies skin rash/ lesion and itching.  Eyes:   Patient denies blurred vision and double vision.  Ears/ Nose/ Throat:   Patient denies sore throat and sinus problems.  Hematologic/Lymphatic:   Patient denies swollen glands and easy bruising.  Cardiovascular:   Patient reports leg swelling. Patient  denies chest pains.  Respiratory:   Patient denies cough and shortness of breath.  Endocrine:   Patient denies excessive thirst.  Musculoskeletal:   Patient denies joint pain and back pain.  Neurological:   Patient denies headaches and dizziness.  Psychologic:   Patient denies depression and anxiety.   Notes: Edema in legs- draining- currently on antibiotic for this    VITAL SIGNS:      09/18/2022 12:44 PM  Weight 163 lb / 73.94 kg  BP 148/61 mmHg  Pulse 85 /min  Temperature 97.1 F / 36.1 C   GU PHYSICAL EXAMINATION:    Urethral Meatus: Severe urethral retraction. Normal size. No discharge.   Urethra: No tenderness, no mass, no scarring. No hypermobility. No leakage.  Bladder: Normal to palpation, no tenderness, no mass, normal size.  Vagina: Severe vaginal atrophy. No stenosis. No rectocele. No cystocele. No enterocele.    MULTI-SYSTEM PHYSICAL EXAMINATION:    Constitutional: Well-nourished. No physical deformities. Normally developed. Good grooming.  Neurologic / Psychiatric: Patient anxious. Oriented to time, oriented to place, oriented to person. No depression, no agitation.   Musculoskeletal: Normal gait and station of head and neck.     Complexity of Data:  Records Review:   Previous Patient Records   PROCEDURES:         Flexible Cystoscopy - 52000  Risks, benefits, and some of  the potential complications of the procedure were discussed at length with the patient including infection, bleeding, voiding discomfort, urinary retention, fever, chills, sepsis, and others. All questions were answered. Informed consent was obtained. Antibiotic prophylaxis was given. Sterile technique and intraurethral analgesia were used.  Meatus:  Normal size. Normal location. Normal condition.  Urethra:  No hypermobility. No leakage.  Ureteral Orifices:  Normal location. Normal size. Normal shape. Effluxed clear urine. Left ureteral orifice surgically absent  Bladder:  A dome tumor. 1 cm tumor.  Solitary tumor. No trabeculation. Normal mucosa. No stones.      The lower urinary tract was carefully examined. The procedure was well-tolerated and without complications. Antibiotic instructions were given. Instructions were given to call the office immediately for bloody urine, difficulty urinating, urinary retention, painful or frequent urination, fever, chills, nausea, vomiting or other illness. The patient stated that she understood these instructions and would comply with them.        In and Out Cath for Specimen/ Medicare - [phone]  A 14 French clear straight catheter was inserted into the bladder using sterile technique 250 cc of urine was obtained.         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 Contrast - Renal US           Schedule Return Visit/Planned Activity: Next Available Appointment - Schedule Surgery          Document Letter(s):  Created for [place] R. 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 No other intravesical or urethral abnormalities were seen   The risks, benefits and alternatives of cystoscopy with TURBT with gemcitabine instillation was discussed with the patient. The risks include, but are not limited to, bleeding, urinary tract infection, bladder perforation requiring prolonged catheterization and/or open bladder repair, ureteral obstruction, voiding dysfunction and the inherent risks of general anesthesia. The patient voices understanding and wishes to proceed.   -Surveillance renal ultrasound pending

## 2022-10-07 NOTE — Op Note (Signed)
Operative Note  Preoperative diagnosis:  1.  2 cm bladder dome tumor 2.  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  2 cm bladder dome tumor 2.  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  Cystoscopy with TURBT medium 2.  Intravesical instillation of gemcitabine  Surgeon: Ellison Hughs, MD  Assistants:  None  Anesthesia:  General  Complications:  None  EBL: Less than 5 mL  Specimens: 1.  Dome tumor  Drains/Catheters: 1.  32 French Foley catheter  Intraoperative findings:   Papillary bladder mass involving the bladder dome with no other intravesical or urethral abnormalities  Indication:  [name] is a 77 y.o. female with a history of pT1, high-grade urothelial carcinoma involving the left renal pelvis, s/p left nephro ureterectomy in 2022.  During her surveillance cystoscopy, she was found to have a papillary bladder tumor recurrence and is here today for TURBT.  Description of procedure:  After informed consent was obtained, the patient was brought to the operating room and general endotracheal anesthesia was administered.  The patient was placed in the dorsolithotomy position and prepped and draped in usual sterile fashion.  A timeout was then performed.   A 26 French resectoscope was then inserted into the urethral meatus and advanced into the bladder.  Complete bladder survey revealed a 2 cm papillary bladder tumor involving the bladder dome with no other intravesical abnormalities.  Using a bipolar loop, the bladder dome tumor was resected down to the detrusor musculature.  The area of resection was then extensively fulgurated until hemostasis was achieved.  There was no evidence of bladder perforation.  The bladder tumor fragments were evacuated from the bladder through the  sheath of the cystoscope and sent off for pathologic analysis.  A 16 French Foley catheter was inserted.  While in the recovery room 2000 mg of gemcitabine in 50 mL of water was instilled in the bladder through the catheter and the catheter was plugged. This will remain indwelling for approximately one hour. It will then be drained from the bladder and the catheter will be removed and the patient discharged home.  Plan: Hold gemcitabine intravesically for 1 hour postoperatively.  Follow-up in 2 weeks to discuss pathology results.

## 2022-10-07 NOTE — Anesthesia Procedure Notes (Signed)
Procedure Name: LMA Insertion Date/Time: 10/07/2022 10:17 AM  Performed by: Randye Lobo, CRNAPre-anesthesia Checklist: Patient identified, Emergency Drugs available, Suction available and Patient being monitored Patient Re-evaluated:Patient Re-evaluated prior to induction Oxygen Delivery Method: Circle System Utilized Preoxygenation: Pre-oxygenation with 100% oxygen Induction Type: IV induction Ventilation: Mask ventilation without difficulty LMA: LMA inserted LMA Size: 3.0 Number of attempts: 1 Airway Equipment and Method: Bite block Placement Confirmation: positive ETCO2 Tube secured with: Tape Dental Injury: Teeth and Oropharynx as per pre-operative assessment

## 2022-10-07 NOTE — Transfer of Care (Signed)
Immediate Anesthesia Transfer of Care Note  Patient: [name]  Procedure(s) Performed: TRANSURETHRAL RESECTION OF BLADDER TUMOR (TURBT) with GEMCITABINE INSTILLATION CYSTOSCOPY  Patient Location: PACU  Anesthesia Type:General  Level of Consciousness: awake, alert , and patient cooperative  Airway & Oxygen Therapy: Patient Spontanous Breathing and Patient connected to nasal cannula oxygen  Post-op Assessment: Report given to RN, Post -op Vital signs reviewed and stable, and Patient moving all extremities  Post vital signs: Reviewed and stable  Last Vitals:  Vitals Value Taken Time  BP 148/97 10/07/22 1055  Temp    Pulse 86 10/07/22 1056  Resp 16 10/07/22 1056  SpO2 99 % 10/07/22 1056  Vitals shown include unvalidated device data.  Last Pain:  Vitals:   10/07/22 0837  TempSrc:   PainSc: 5       Patients Stated Pain Goal: 0 ([id number])  Complications: No notable events documented.

## 2022-10-08 ENCOUNTER — Encounter (HOSPITAL_COMMUNITY): Payer: Self-pay | Admitting: Urology

## 2022-10-08 LAB — SURGICAL PATHOLOGY

## 2022-10-09 DIAGNOSIS — I739 Peripheral vascular disease, unspecified: Secondary | ICD-10-CM | POA: Diagnosis not present

## 2022-10-09 DIAGNOSIS — C679 Malignant neoplasm of bladder, unspecified: Secondary | ICD-10-CM | POA: Diagnosis not present

## 2022-10-10 DIAGNOSIS — J309 Allergic rhinitis, unspecified: Secondary | ICD-10-CM | POA: Diagnosis not present

## 2022-10-10 DIAGNOSIS — Z8551 Personal history of malignant neoplasm of bladder: Secondary | ICD-10-CM | POA: Diagnosis not present

## 2022-10-10 DIAGNOSIS — N183 Chronic kidney disease, stage 3 unspecified: Secondary | ICD-10-CM | POA: Diagnosis not present

## 2022-10-10 DIAGNOSIS — E559 Vitamin D deficiency, unspecified: Secondary | ICD-10-CM | POA: Diagnosis not present

## 2022-10-10 DIAGNOSIS — E1122 Type 2 diabetes mellitus with diabetic chronic kidney disease: Secondary | ICD-10-CM | POA: Diagnosis not present

## 2022-10-10 DIAGNOSIS — G4700 Insomnia, unspecified: Secondary | ICD-10-CM | POA: Diagnosis not present

## 2022-10-10 DIAGNOSIS — D631 Anemia in chronic kidney disease: Secondary | ICD-10-CM | POA: Diagnosis not present

## 2022-10-10 DIAGNOSIS — S81801D Unspecified open wound, right lower leg, subsequent encounter: Secondary | ICD-10-CM | POA: Diagnosis not present

## 2022-10-10 DIAGNOSIS — Z7982 Long term (current) use of aspirin: Secondary | ICD-10-CM | POA: Diagnosis not present

## 2022-10-10 DIAGNOSIS — Z7952 Long term (current) use of systemic steroids: Secondary | ICD-10-CM | POA: Diagnosis not present

## 2022-10-10 DIAGNOSIS — G3184 Mild cognitive impairment, so stated: Secondary | ICD-10-CM | POA: Diagnosis not present

## 2022-10-10 DIAGNOSIS — G894 Chronic pain syndrome: Secondary | ICD-10-CM | POA: Diagnosis not present

## 2022-10-10 DIAGNOSIS — I129 Hypertensive chronic kidney disease with stage 1 through stage 4 chronic kidney disease, or unspecified chronic kidney disease: Secondary | ICD-10-CM | POA: Diagnosis not present

## 2022-10-10 DIAGNOSIS — Z993 Dependence on wheelchair: Secondary | ICD-10-CM | POA: Diagnosis not present

## 2022-10-10 DIAGNOSIS — K5900 Constipation, unspecified: Secondary | ICD-10-CM | POA: Diagnosis not present

## 2022-10-10 DIAGNOSIS — M4800 Spinal stenosis, site unspecified: Secondary | ICD-10-CM | POA: Diagnosis not present

## 2022-10-10 DIAGNOSIS — E785 Hyperlipidemia, unspecified: Secondary | ICD-10-CM | POA: Diagnosis not present

## 2022-10-10 DIAGNOSIS — E1151 Type 2 diabetes mellitus with diabetic peripheral angiopathy without gangrene: Secondary | ICD-10-CM | POA: Diagnosis not present

## 2022-10-10 DIAGNOSIS — S80822D Blister (nonthermal), left lower leg, subsequent encounter: Secondary | ICD-10-CM | POA: Diagnosis not present

## 2022-10-10 DIAGNOSIS — M069 Rheumatoid arthritis, unspecified: Secondary | ICD-10-CM | POA: Diagnosis not present

## 2022-10-10 NOTE — Anesthesia Postprocedure Evaluation (Signed)
Anesthesia Post Note  Patient: [name]  Procedure(s) Performed: TRANSURETHRAL RESECTION OF BLADDER TUMOR (TURBT) with GEMCITABINE INSTILLATION CYSTOSCOPY     Patient location during evaluation: PACU Anesthesia Type: General Level of consciousness: awake and alert Pain management: pain level controlled Vital Signs Assessment: post-procedure vital signs reviewed and stable Respiratory status: spontaneous breathing, nonlabored ventilation, respiratory function stable and patient connected to nasal cannula oxygen Cardiovascular status: blood pressure returned to baseline and stable Postop Assessment: no apparent nausea or vomiting Anesthetic complications: no   No notable events documented.  Last Vitals:  Vitals:   10/07/22 1300 10/07/22 1402  BP: (!) 144/79 (!) 120/56  Pulse: 80 85  Resp: 15 15  Temp:  36.5 C  SpO2: 94% 99%    Last Pain:  Vitals:   10/07/22 1402  TempSrc:   PainSc: 0-No pain                 Lesette Frary S

## 2022-10-13 DIAGNOSIS — E1122 Type 2 diabetes mellitus with diabetic chronic kidney disease: Secondary | ICD-10-CM | POA: Diagnosis not present

## 2022-10-13 DIAGNOSIS — D631 Anemia in chronic kidney disease: Secondary | ICD-10-CM | POA: Diagnosis not present

## 2022-10-13 DIAGNOSIS — S81801D Unspecified open wound, right lower leg, subsequent encounter: Secondary | ICD-10-CM | POA: Diagnosis not present

## 2022-10-13 DIAGNOSIS — S80822D Blister (nonthermal), left lower leg, subsequent encounter: Secondary | ICD-10-CM | POA: Diagnosis not present

## 2022-10-13 DIAGNOSIS — G4700 Insomnia, unspecified: Secondary | ICD-10-CM | POA: Diagnosis not present

## 2022-10-13 DIAGNOSIS — Z7982 Long term (current) use of aspirin: Secondary | ICD-10-CM | POA: Diagnosis not present

## 2022-10-13 DIAGNOSIS — Z8551 Personal history of malignant neoplasm of bladder: Secondary | ICD-10-CM | POA: Diagnosis not present

## 2022-10-13 DIAGNOSIS — E559 Vitamin D deficiency, unspecified: Secondary | ICD-10-CM | POA: Diagnosis not present

## 2022-10-13 DIAGNOSIS — M4800 Spinal stenosis, site unspecified: Secondary | ICD-10-CM | POA: Diagnosis not present

## 2022-10-13 DIAGNOSIS — K5900 Constipation, unspecified: Secondary | ICD-10-CM | POA: Diagnosis not present

## 2022-10-13 DIAGNOSIS — I129 Hypertensive chronic kidney disease with stage 1 through stage 4 chronic kidney disease, or unspecified chronic kidney disease: Secondary | ICD-10-CM | POA: Diagnosis not present

## 2022-10-13 DIAGNOSIS — Z7952 Long term (current) use of systemic steroids: Secondary | ICD-10-CM | POA: Diagnosis not present

## 2022-10-13 DIAGNOSIS — G894 Chronic pain syndrome: Secondary | ICD-10-CM | POA: Diagnosis not present

## 2022-10-13 DIAGNOSIS — E1151 Type 2 diabetes mellitus with diabetic peripheral angiopathy without gangrene: Secondary | ICD-10-CM | POA: Diagnosis not present

## 2022-10-13 DIAGNOSIS — E785 Hyperlipidemia, unspecified: Secondary | ICD-10-CM | POA: Diagnosis not present

## 2022-10-13 DIAGNOSIS — G3184 Mild cognitive impairment, so stated: Secondary | ICD-10-CM | POA: Diagnosis not present

## 2022-10-13 DIAGNOSIS — N183 Chronic kidney disease, stage 3 unspecified: Secondary | ICD-10-CM | POA: Diagnosis not present

## 2022-10-13 DIAGNOSIS — M069 Rheumatoid arthritis, unspecified: Secondary | ICD-10-CM | POA: Diagnosis not present

## 2022-10-13 DIAGNOSIS — Z993 Dependence on wheelchair: Secondary | ICD-10-CM | POA: Diagnosis not present

## 2022-10-13 DIAGNOSIS — J309 Allergic rhinitis, unspecified: Secondary | ICD-10-CM | POA: Diagnosis not present

## 2022-10-16 DIAGNOSIS — C671 Malignant neoplasm of dome of bladder: Secondary | ICD-10-CM | POA: Diagnosis not present

## 2022-10-16 DIAGNOSIS — C652 Malignant neoplasm of left renal pelvis: Secondary | ICD-10-CM | POA: Diagnosis not present

## 2022-10-17 DIAGNOSIS — D631 Anemia in chronic kidney disease: Secondary | ICD-10-CM | POA: Diagnosis not present

## 2022-10-17 DIAGNOSIS — E559 Vitamin D deficiency, unspecified: Secondary | ICD-10-CM | POA: Diagnosis not present

## 2022-10-17 DIAGNOSIS — G4700 Insomnia, unspecified: Secondary | ICD-10-CM | POA: Diagnosis not present

## 2022-10-17 DIAGNOSIS — Z7982 Long term (current) use of aspirin: Secondary | ICD-10-CM | POA: Diagnosis not present

## 2022-10-17 DIAGNOSIS — Z7952 Long term (current) use of systemic steroids: Secondary | ICD-10-CM | POA: Diagnosis not present

## 2022-10-17 DIAGNOSIS — M069 Rheumatoid arthritis, unspecified: Secondary | ICD-10-CM | POA: Diagnosis not present

## 2022-10-17 DIAGNOSIS — M4800 Spinal stenosis, site unspecified: Secondary | ICD-10-CM | POA: Diagnosis not present

## 2022-10-17 DIAGNOSIS — S80822D Blister (nonthermal), left lower leg, subsequent encounter: Secondary | ICD-10-CM | POA: Diagnosis not present

## 2022-10-17 DIAGNOSIS — N183 Chronic kidney disease, stage 3 unspecified: Secondary | ICD-10-CM | POA: Diagnosis not present

## 2022-10-17 DIAGNOSIS — I129 Hypertensive chronic kidney disease with stage 1 through stage 4 chronic kidney disease, or unspecified chronic kidney disease: Secondary | ICD-10-CM | POA: Diagnosis not present

## 2022-10-17 DIAGNOSIS — S81801D Unspecified open wound, right lower leg, subsequent encounter: Secondary | ICD-10-CM | POA: Diagnosis not present

## 2022-10-17 DIAGNOSIS — Z993 Dependence on wheelchair: Secondary | ICD-10-CM | POA: Diagnosis not present

## 2022-10-17 DIAGNOSIS — G3184 Mild cognitive impairment, so stated: Secondary | ICD-10-CM | POA: Diagnosis not present

## 2022-10-17 DIAGNOSIS — E785 Hyperlipidemia, unspecified: Secondary | ICD-10-CM | POA: Diagnosis not present

## 2022-10-17 DIAGNOSIS — Z8551 Personal history of malignant neoplasm of bladder: Secondary | ICD-10-CM | POA: Diagnosis not present

## 2022-10-17 DIAGNOSIS — E1122 Type 2 diabetes mellitus with diabetic chronic kidney disease: Secondary | ICD-10-CM | POA: Diagnosis not present

## 2022-10-17 DIAGNOSIS — G894 Chronic pain syndrome: Secondary | ICD-10-CM | POA: Diagnosis not present

## 2022-10-17 DIAGNOSIS — J309 Allergic rhinitis, unspecified: Secondary | ICD-10-CM | POA: Diagnosis not present

## 2022-10-17 DIAGNOSIS — E1151 Type 2 diabetes mellitus with diabetic peripheral angiopathy without gangrene: Secondary | ICD-10-CM | POA: Diagnosis not present

## 2022-10-17 DIAGNOSIS — K5900 Constipation, unspecified: Secondary | ICD-10-CM | POA: Diagnosis not present

## 2022-10-21 DIAGNOSIS — G4700 Insomnia, unspecified: Secondary | ICD-10-CM | POA: Diagnosis not present

## 2022-10-21 DIAGNOSIS — E1122 Type 2 diabetes mellitus with diabetic chronic kidney disease: Secondary | ICD-10-CM | POA: Diagnosis not present

## 2022-10-21 DIAGNOSIS — E1151 Type 2 diabetes mellitus with diabetic peripheral angiopathy without gangrene: Secondary | ICD-10-CM | POA: Diagnosis not present

## 2022-10-21 DIAGNOSIS — D631 Anemia in chronic kidney disease: Secondary | ICD-10-CM | POA: Diagnosis not present

## 2022-10-21 DIAGNOSIS — G3184 Mild cognitive impairment, so stated: Secondary | ICD-10-CM | POA: Diagnosis not present

## 2022-10-21 DIAGNOSIS — E559 Vitamin D deficiency, unspecified: Secondary | ICD-10-CM | POA: Diagnosis not present

## 2022-10-21 DIAGNOSIS — K5900 Constipation, unspecified: Secondary | ICD-10-CM | POA: Diagnosis not present

## 2022-10-21 DIAGNOSIS — M069 Rheumatoid arthritis, unspecified: Secondary | ICD-10-CM | POA: Diagnosis not present

## 2022-10-21 DIAGNOSIS — Z7952 Long term (current) use of systemic steroids: Secondary | ICD-10-CM | POA: Diagnosis not present

## 2022-10-21 DIAGNOSIS — Z7982 Long term (current) use of aspirin: Secondary | ICD-10-CM | POA: Diagnosis not present

## 2022-10-21 DIAGNOSIS — E785 Hyperlipidemia, unspecified: Secondary | ICD-10-CM | POA: Diagnosis not present

## 2022-10-21 DIAGNOSIS — N183 Chronic kidney disease, stage 3 unspecified: Secondary | ICD-10-CM | POA: Diagnosis not present

## 2022-10-21 DIAGNOSIS — M4800 Spinal stenosis, site unspecified: Secondary | ICD-10-CM | POA: Diagnosis not present

## 2022-10-21 DIAGNOSIS — S80822D Blister (nonthermal), left lower leg, subsequent encounter: Secondary | ICD-10-CM | POA: Diagnosis not present

## 2022-10-21 DIAGNOSIS — S81801D Unspecified open wound, right lower leg, subsequent encounter: Secondary | ICD-10-CM | POA: Diagnosis not present

## 2022-10-21 DIAGNOSIS — G894 Chronic pain syndrome: Secondary | ICD-10-CM | POA: Diagnosis not present

## 2022-10-21 DIAGNOSIS — J309 Allergic rhinitis, unspecified: Secondary | ICD-10-CM | POA: Diagnosis not present

## 2022-10-21 DIAGNOSIS — Z993 Dependence on wheelchair: Secondary | ICD-10-CM | POA: Diagnosis not present

## 2022-10-21 DIAGNOSIS — Z8551 Personal history of malignant neoplasm of bladder: Secondary | ICD-10-CM | POA: Diagnosis not present

## 2022-10-21 DIAGNOSIS — I129 Hypertensive chronic kidney disease with stage 1 through stage 4 chronic kidney disease, or unspecified chronic kidney disease: Secondary | ICD-10-CM | POA: Diagnosis not present

## 2022-10-23 DIAGNOSIS — E559 Vitamin D deficiency, unspecified: Secondary | ICD-10-CM | POA: Diagnosis not present

## 2022-10-23 DIAGNOSIS — N183 Chronic kidney disease, stage 3 unspecified: Secondary | ICD-10-CM | POA: Diagnosis not present

## 2022-10-23 DIAGNOSIS — M4800 Spinal stenosis, site unspecified: Secondary | ICD-10-CM | POA: Diagnosis not present

## 2022-10-23 DIAGNOSIS — D631 Anemia in chronic kidney disease: Secondary | ICD-10-CM | POA: Diagnosis not present

## 2022-10-23 DIAGNOSIS — Z993 Dependence on wheelchair: Secondary | ICD-10-CM | POA: Diagnosis not present

## 2022-10-23 DIAGNOSIS — S80822D Blister (nonthermal), left lower leg, subsequent encounter: Secondary | ICD-10-CM | POA: Diagnosis not present

## 2022-10-23 DIAGNOSIS — S81801D Unspecified open wound, right lower leg, subsequent encounter: Secondary | ICD-10-CM | POA: Diagnosis not present

## 2022-10-23 DIAGNOSIS — Z7982 Long term (current) use of aspirin: Secondary | ICD-10-CM | POA: Diagnosis not present

## 2022-10-23 DIAGNOSIS — E785 Hyperlipidemia, unspecified: Secondary | ICD-10-CM | POA: Diagnosis not present

## 2022-10-23 DIAGNOSIS — G3184 Mild cognitive impairment, so stated: Secondary | ICD-10-CM | POA: Diagnosis not present

## 2022-10-23 DIAGNOSIS — E1122 Type 2 diabetes mellitus with diabetic chronic kidney disease: Secondary | ICD-10-CM | POA: Diagnosis not present

## 2022-10-23 DIAGNOSIS — K5900 Constipation, unspecified: Secondary | ICD-10-CM | POA: Diagnosis not present

## 2022-10-23 DIAGNOSIS — G4700 Insomnia, unspecified: Secondary | ICD-10-CM | POA: Diagnosis not present

## 2022-10-23 DIAGNOSIS — E1151 Type 2 diabetes mellitus with diabetic peripheral angiopathy without gangrene: Secondary | ICD-10-CM | POA: Diagnosis not present

## 2022-10-23 DIAGNOSIS — Z8551 Personal history of malignant neoplasm of bladder: Secondary | ICD-10-CM | POA: Diagnosis not present

## 2022-10-23 DIAGNOSIS — G894 Chronic pain syndrome: Secondary | ICD-10-CM | POA: Diagnosis not present

## 2022-10-23 DIAGNOSIS — M069 Rheumatoid arthritis, unspecified: Secondary | ICD-10-CM | POA: Diagnosis not present

## 2022-10-23 DIAGNOSIS — Z7952 Long term (current) use of systemic steroids: Secondary | ICD-10-CM | POA: Diagnosis not present

## 2022-10-23 DIAGNOSIS — I129 Hypertensive chronic kidney disease with stage 1 through stage 4 chronic kidney disease, or unspecified chronic kidney disease: Secondary | ICD-10-CM | POA: Diagnosis not present

## 2022-10-23 DIAGNOSIS — J309 Allergic rhinitis, unspecified: Secondary | ICD-10-CM | POA: Diagnosis not present

## 2022-10-27 DIAGNOSIS — M069 Rheumatoid arthritis, unspecified: Secondary | ICD-10-CM | POA: Diagnosis not present

## 2022-10-27 DIAGNOSIS — D631 Anemia in chronic kidney disease: Secondary | ICD-10-CM | POA: Diagnosis not present

## 2022-10-27 DIAGNOSIS — N183 Chronic kidney disease, stage 3 unspecified: Secondary | ICD-10-CM | POA: Diagnosis not present

## 2022-10-27 DIAGNOSIS — S80822D Blister (nonthermal), left lower leg, subsequent encounter: Secondary | ICD-10-CM | POA: Diagnosis not present

## 2022-10-27 DIAGNOSIS — E1122 Type 2 diabetes mellitus with diabetic chronic kidney disease: Secondary | ICD-10-CM | POA: Diagnosis not present

## 2022-10-27 DIAGNOSIS — M4800 Spinal stenosis, site unspecified: Secondary | ICD-10-CM | POA: Diagnosis not present

## 2022-10-27 DIAGNOSIS — G3184 Mild cognitive impairment, so stated: Secondary | ICD-10-CM | POA: Diagnosis not present

## 2022-10-27 DIAGNOSIS — G4700 Insomnia, unspecified: Secondary | ICD-10-CM | POA: Diagnosis not present

## 2022-10-27 DIAGNOSIS — Z7982 Long term (current) use of aspirin: Secondary | ICD-10-CM | POA: Diagnosis not present

## 2022-10-27 DIAGNOSIS — G894 Chronic pain syndrome: Secondary | ICD-10-CM | POA: Diagnosis not present

## 2022-10-27 DIAGNOSIS — Z8551 Personal history of malignant neoplasm of bladder: Secondary | ICD-10-CM | POA: Diagnosis not present

## 2022-10-27 DIAGNOSIS — E1151 Type 2 diabetes mellitus with diabetic peripheral angiopathy without gangrene: Secondary | ICD-10-CM | POA: Diagnosis not present

## 2022-10-27 DIAGNOSIS — E559 Vitamin D deficiency, unspecified: Secondary | ICD-10-CM | POA: Diagnosis not present

## 2022-10-27 DIAGNOSIS — Z993 Dependence on wheelchair: Secondary | ICD-10-CM | POA: Diagnosis not present

## 2022-10-27 DIAGNOSIS — I129 Hypertensive chronic kidney disease with stage 1 through stage 4 chronic kidney disease, or unspecified chronic kidney disease: Secondary | ICD-10-CM | POA: Diagnosis not present

## 2022-10-27 DIAGNOSIS — K5900 Constipation, unspecified: Secondary | ICD-10-CM | POA: Diagnosis not present

## 2022-10-27 DIAGNOSIS — E785 Hyperlipidemia, unspecified: Secondary | ICD-10-CM | POA: Diagnosis not present

## 2022-10-27 DIAGNOSIS — Z7952 Long term (current) use of systemic steroids: Secondary | ICD-10-CM | POA: Diagnosis not present

## 2022-10-27 DIAGNOSIS — S81801D Unspecified open wound, right lower leg, subsequent encounter: Secondary | ICD-10-CM | POA: Diagnosis not present

## 2022-10-27 DIAGNOSIS — J309 Allergic rhinitis, unspecified: Secondary | ICD-10-CM | POA: Diagnosis not present

## 2022-10-29 DIAGNOSIS — I129 Hypertensive chronic kidney disease with stage 1 through stage 4 chronic kidney disease, or unspecified chronic kidney disease: Secondary | ICD-10-CM | POA: Diagnosis not present

## 2022-10-29 DIAGNOSIS — L03116 Cellulitis of left lower limb: Secondary | ICD-10-CM | POA: Diagnosis not present

## 2022-10-29 DIAGNOSIS — N1832 Chronic kidney disease, stage 3b: Secondary | ICD-10-CM | POA: Diagnosis not present

## 2022-10-29 DIAGNOSIS — R600 Localized edema: Secondary | ICD-10-CM | POA: Diagnosis not present

## 2022-10-29 DIAGNOSIS — L03115 Cellulitis of right lower limb: Secondary | ICD-10-CM | POA: Diagnosis not present

## 2022-10-30 ENCOUNTER — Encounter (HOSPITAL_COMMUNITY)
Admission: EM | Disposition: A | Discharge status: Skilled Nursing Facility | Payer: Self-pay | Source: Skilled Nursing Facility | Attending: Internal Medicine

## 2022-10-30 ENCOUNTER — Emergency Department (HOSPITAL_COMMUNITY): Payer: Medicare Other | Admitting: Anesthesiology

## 2022-10-30 ENCOUNTER — Emergency Department (HOSPITAL_COMMUNITY): Payer: Medicare Other

## 2022-10-30 ENCOUNTER — Other Ambulatory Visit: Payer: Self-pay

## 2022-10-30 ENCOUNTER — Inpatient Hospital Stay (HOSPITAL_COMMUNITY)
Admission: EM | Admit: 2022-10-30 | Discharge: 2022-11-03 | DRG: 654 | Disposition: A | Discharge status: Skilled Nursing Facility | Payer: Medicare Other | Source: Skilled Nursing Facility | Attending: Internal Medicine | Admitting: Internal Medicine

## 2022-10-30 ENCOUNTER — Encounter (HOSPITAL_COMMUNITY): Payer: Self-pay | Admitting: *Deleted

## 2022-10-30 DIAGNOSIS — Z743 Need for continuous supervision: Secondary | ICD-10-CM | POA: Diagnosis not present

## 2022-10-30 DIAGNOSIS — Z7401 Bed confinement status: Secondary | ICD-10-CM | POA: Diagnosis not present

## 2022-10-30 DIAGNOSIS — R1030 Lower abdominal pain, unspecified: Secondary | ICD-10-CM | POA: Diagnosis not present

## 2022-10-30 DIAGNOSIS — S3729XA Other injury of bladder, initial encounter: Secondary | ICD-10-CM | POA: Diagnosis present

## 2022-10-30 DIAGNOSIS — R310 Gross hematuria: Secondary | ICD-10-CM | POA: Diagnosis present

## 2022-10-30 DIAGNOSIS — M069 Rheumatoid arthritis, unspecified: Secondary | ICD-10-CM | POA: Diagnosis present

## 2022-10-30 DIAGNOSIS — I129 Hypertensive chronic kidney disease with stage 1 through stage 4 chronic kidney disease, or unspecified chronic kidney disease: Secondary | ICD-10-CM | POA: Diagnosis not present

## 2022-10-30 DIAGNOSIS — R188 Other ascites: Secondary | ICD-10-CM | POA: Diagnosis present

## 2022-10-30 DIAGNOSIS — E875 Hyperkalemia: Secondary | ICD-10-CM | POA: Diagnosis present

## 2022-10-30 DIAGNOSIS — J45909 Unspecified asthma, uncomplicated: Secondary | ICD-10-CM | POA: Diagnosis not present

## 2022-10-30 DIAGNOSIS — Z9889 Other specified postprocedural states: Secondary | ICD-10-CM | POA: Diagnosis not present

## 2022-10-30 DIAGNOSIS — S3720XA Unspecified injury of bladder, initial encounter: Secondary | ICD-10-CM | POA: Diagnosis present

## 2022-10-30 DIAGNOSIS — Z981 Arthrodesis status: Secondary | ICD-10-CM | POA: Diagnosis not present

## 2022-10-30 DIAGNOSIS — D62 Acute posthemorrhagic anemia: Secondary | ICD-10-CM | POA: Diagnosis not present

## 2022-10-30 DIAGNOSIS — Z5329 Procedure and treatment not carried out because of patient's decision for other reasons: Secondary | ICD-10-CM | POA: Diagnosis not present

## 2022-10-30 DIAGNOSIS — R0902 Hypoxemia: Secondary | ICD-10-CM | POA: Diagnosis not present

## 2022-10-30 DIAGNOSIS — N3090 Cystitis, unspecified without hematuria: Secondary | ICD-10-CM | POA: Diagnosis not present

## 2022-10-30 DIAGNOSIS — K219 Gastro-esophageal reflux disease without esophagitis: Secondary | ICD-10-CM | POA: Diagnosis not present

## 2022-10-30 DIAGNOSIS — Y838 Other surgical procedures as the cause of abnormal reaction of the patient, or of later complication, without mention of misadventure at the time of the procedure: Secondary | ICD-10-CM | POA: Diagnosis present

## 2022-10-30 DIAGNOSIS — I10 Essential (primary) hypertension: Secondary | ICD-10-CM | POA: Diagnosis not present

## 2022-10-30 DIAGNOSIS — R109 Unspecified abdominal pain: Secondary | ICD-10-CM | POA: Diagnosis present

## 2022-10-30 DIAGNOSIS — F419 Anxiety disorder, unspecified: Secondary | ICD-10-CM | POA: Diagnosis not present

## 2022-10-30 DIAGNOSIS — Z8553 Personal history of malignant neoplasm of renal pelvis: Secondary | ICD-10-CM

## 2022-10-30 DIAGNOSIS — Z7982 Long term (current) use of aspirin: Secondary | ICD-10-CM | POA: Diagnosis not present

## 2022-10-30 DIAGNOSIS — Z90710 Acquired absence of both cervix and uterus: Secondary | ICD-10-CM

## 2022-10-30 DIAGNOSIS — N179 Acute kidney failure, unspecified: Secondary | ICD-10-CM | POA: Diagnosis present

## 2022-10-30 DIAGNOSIS — Z91041 Radiographic dye allergy status: Secondary | ICD-10-CM

## 2022-10-30 DIAGNOSIS — N1831 Chronic kidney disease, stage 3a: Secondary | ICD-10-CM | POA: Diagnosis present

## 2022-10-30 DIAGNOSIS — E1122 Type 2 diabetes mellitus with diabetic chronic kidney disease: Secondary | ICD-10-CM | POA: Diagnosis present

## 2022-10-30 DIAGNOSIS — F32A Depression, unspecified: Secondary | ICD-10-CM | POA: Diagnosis present

## 2022-10-30 DIAGNOSIS — Z7952 Long term (current) use of systemic steroids: Secondary | ICD-10-CM

## 2022-10-30 DIAGNOSIS — N3289 Other specified disorders of bladder: Secondary | ICD-10-CM

## 2022-10-30 DIAGNOSIS — R4189 Other symptoms and signs involving cognitive functions and awareness: Secondary | ICD-10-CM | POA: Diagnosis present

## 2022-10-30 DIAGNOSIS — F418 Other specified anxiety disorders: Secondary | ICD-10-CM | POA: Diagnosis not present

## 2022-10-30 DIAGNOSIS — R6889 Other general symptoms and signs: Secondary | ICD-10-CM | POA: Diagnosis not present

## 2022-10-30 DIAGNOSIS — F332 Major depressive disorder, recurrent severe without psychotic features: Secondary | ICD-10-CM | POA: Diagnosis present

## 2022-10-30 DIAGNOSIS — R1011 Right upper quadrant pain: Secondary | ICD-10-CM | POA: Diagnosis not present

## 2022-10-30 DIAGNOSIS — E7800 Pure hypercholesterolemia, unspecified: Secondary | ICD-10-CM | POA: Diagnosis not present

## 2022-10-30 DIAGNOSIS — Z888 Allergy status to other drugs, medicaments and biological substances status: Secondary | ICD-10-CM

## 2022-10-30 DIAGNOSIS — Z79899 Other long term (current) drug therapy: Secondary | ICD-10-CM

## 2022-10-30 DIAGNOSIS — Z8551 Personal history of malignant neoplasm of bladder: Secondary | ICD-10-CM | POA: Diagnosis not present

## 2022-10-30 DIAGNOSIS — R410 Disorientation, unspecified: Secondary | ICD-10-CM | POA: Diagnosis not present

## 2022-10-30 DIAGNOSIS — Z8603 Personal history of neoplasm of uncertain behavior: Secondary | ICD-10-CM | POA: Diagnosis not present

## 2022-10-30 DIAGNOSIS — C678 Malignant neoplasm of overlapping sites of bladder: Secondary | ICD-10-CM | POA: Diagnosis not present

## 2022-10-30 DIAGNOSIS — N9989 Other postprocedural complications and disorders of genitourinary system: Principal | ICD-10-CM | POA: Diagnosis present

## 2022-10-30 DIAGNOSIS — F333 Major depressive disorder, recurrent, severe with psychotic symptoms: Secondary | ICD-10-CM | POA: Diagnosis not present

## 2022-10-30 DIAGNOSIS — E039 Hypothyroidism, unspecified: Secondary | ICD-10-CM | POA: Diagnosis present

## 2022-10-30 DIAGNOSIS — K5710 Diverticulosis of small intestine without perforation or abscess without bleeding: Secondary | ICD-10-CM | POA: Diagnosis not present

## 2022-10-30 DIAGNOSIS — Z833 Family history of diabetes mellitus: Secondary | ICD-10-CM

## 2022-10-30 DIAGNOSIS — Z881 Allergy status to other antibiotic agents status: Secondary | ICD-10-CM

## 2022-10-30 DIAGNOSIS — K5900 Constipation, unspecified: Secondary | ICD-10-CM | POA: Diagnosis present

## 2022-10-30 HISTORY — PX: BLADDER REPAIR: SHX6721

## 2022-10-30 LAB — POCT I-STAT EG7
Acid-base deficit: 3 mmol/L — ABNORMAL HIGH (ref 0.0–2.0)
Bicarbonate: 21.1 mmol/L (ref 20.0–28.0)
Calcium, Ion: 1.03 mmol/L — ABNORMAL LOW (ref 1.15–1.40)
HCT: 29 % — ABNORMAL LOW (ref 36.0–46.0)
Hemoglobin: 9.9 g/dL — ABNORMAL LOW (ref 12.0–15.0)
O2 Saturation: 22 %
Potassium: 5.6 mmol/L — ABNORMAL HIGH (ref 3.5–5.1)
Sodium: 137 mmol/L (ref 135–145)
TCO2: 22 mmol/L (ref 22–32)
pCO2, Ven: 35.3 mmHg — ABNORMAL LOW (ref 44–60)
pH, Ven: 7.386 (ref 7.25–7.43)
pO2, Ven: 16 mmHg — CL (ref 32–45)

## 2022-10-30 LAB — LACTIC ACID, PLASMA
Lactic Acid, Venous: 1.8 mmol/L (ref 0.5–1.9)
Lactic Acid, Venous: 1.8 mmol/L (ref 0.5–1.9)

## 2022-10-30 LAB — CBC WITH DIFFERENTIAL/PLATELET
Abs Immature Granulocytes: 0.03 10*3/uL (ref 0.00–0.07)
Basophils Absolute: 0 10*3/uL (ref 0.0–0.1)
Basophils Relative: 1 %
Eosinophils Absolute: 0.2 10*3/uL (ref 0.0–0.5)
Eosinophils Relative: 2 %
HCT: 33.1 % — ABNORMAL LOW (ref 36.0–46.0)
Hemoglobin: 10.1 g/dL — ABNORMAL LOW (ref 12.0–15.0)
Immature Granulocytes: 0 %
Lymphocytes Relative: 7 %
Lymphs Abs: 0.6 10*3/uL — ABNORMAL LOW (ref 0.7–4.0)
MCH: 29.5 pg (ref 26.0–34.0)
MCHC: 30.5 g/dL (ref 30.0–36.0)
MCV: 96.8 fL (ref 80.0–100.0)
Monocytes Absolute: 0.6 10*3/uL (ref 0.1–1.0)
Monocytes Relative: 7 %
Neutro Abs: 7 10*3/uL (ref 1.7–7.7)
Neutrophils Relative %: 83 %
Platelets: 232 10*3/uL (ref 150–400)
RBC: 3.42 MIL/uL — ABNORMAL LOW (ref 3.87–5.11)
RDW: 15.6 % — ABNORMAL HIGH (ref 11.5–15.5)
WBC: 8.4 10*3/uL (ref 4.0–10.5)
nRBC: 0 % (ref 0.0–0.2)

## 2022-10-30 LAB — COMPREHENSIVE METABOLIC PANEL
ALT: 29 U/L (ref 0–44)
AST: 23 U/L (ref 15–41)
Albumin: 3 g/dL — ABNORMAL LOW (ref 3.5–5.0)
Alkaline Phosphatase: 91 U/L (ref 38–126)
Anion gap: 11 (ref 5–15)
BUN: 36 mg/dL — ABNORMAL HIGH (ref 8–23)
CO2: 21 mmol/L — ABNORMAL LOW (ref 22–32)
Calcium: 7.8 mg/dL — ABNORMAL LOW (ref 8.9–10.3)
Chloride: 107 mmol/L (ref 98–111)
Creatinine, Ser: 3.28 mg/dL — ABNORMAL HIGH (ref 0.44–1.00)
GFR, Estimated: 14 mL/min — ABNORMAL LOW (ref >60)
Glucose, Bld: 127 mg/dL — ABNORMAL HIGH (ref 70–99)
Potassium: 5.2 mmol/L — ABNORMAL HIGH (ref 3.5–5.1)
Sodium: 139 mmol/L (ref 135–145)
Total Bilirubin: 0.5 mg/dL (ref 0.3–1.2)
Total Protein: 6 g/dL — ABNORMAL LOW (ref 6.5–8.1)

## 2022-10-30 LAB — URINALYSIS, ROUTINE W REFLEX MICROSCOPIC
Bacteria, UA: NONE SEEN
Bilirubin Urine: NEGATIVE
Glucose, UA: NEGATIVE mg/dL
Ketones, ur: NEGATIVE mg/dL
Nitrite: NEGATIVE
Protein, ur: 100 mg/dL — AB
RBC / HPF: >50 RBC/hpf (ref 0–5)
Specific Gravity, Urine: 1.017 (ref 1.005–1.030)
WBC, UA: >50 WBC/hpf (ref 0–5)
pH: 5 (ref 5.0–8.0)

## 2022-10-30 LAB — GLUCOSE, CAPILLARY
Glucose-Capillary: 112 mg/dL — ABNORMAL HIGH (ref 70–99)
Glucose-Capillary: 137 mg/dL — ABNORMAL HIGH (ref 70–99)

## 2022-10-30 LAB — LIPASE, BLOOD: Lipase: 25 U/L (ref 11–51)

## 2022-10-30 SURGERY — REPAIR, BLADDER
Anesthesia: General

## 2022-10-30 MED ORDER — LIDOCAINE HCL (CARDIAC) PF 100 MG/5ML IV SOSY
PREFILLED_SYRINGE | INTRAVENOUS | Status: DC | PRN
  Administered 2022-10-30: 80 mg via INTRAVENOUS

## 2022-10-30 MED ORDER — OXYCODONE-ACETAMINOPHEN 5-325 MG PO TABS
1.0000 | ORAL_TABLET | ORAL | Status: DC | PRN
  Administered 2022-10-31 – 2022-11-03 (×3): 1 via ORAL
  Filled 2022-10-30 (×3): qty 1

## 2022-10-30 MED ORDER — IOHEXOL 300 MG/ML  SOLN
25.0000 mL | Freq: Once | INTRAMUSCULAR | Status: AC | PRN
  Administered 2022-10-30: 25 mL

## 2022-10-30 MED ORDER — PROPOFOL 10 MG/ML IV BOLUS
INTRAVENOUS | Status: AC
  Filled 2022-10-30: qty 20

## 2022-10-30 MED ORDER — HYDROXYZINE HCL 25 MG PO TABS
50.0000 mg | ORAL_TABLET | Freq: Two times a day (BID) | ORAL | Status: DC
  Administered 2022-10-30 – 2022-11-03 (×8): 50 mg via ORAL
  Filled 2022-10-30 (×9): qty 2

## 2022-10-30 MED ORDER — LIDOCAINE HCL (PF) 2 % IJ SOLN
INTRAMUSCULAR | Status: AC
  Filled 2022-10-30: qty 5

## 2022-10-30 MED ORDER — CHLORHEXIDINE GLUCONATE 0.12 % MT SOLN
15.0000 mL | Freq: Once | OROMUCOSAL | Status: AC
  Administered 2022-10-30: 15 mL via OROMUCOSAL

## 2022-10-30 MED ORDER — LACTATED RINGERS IV SOLN
INTRAVENOUS | Status: DC
  Administered 2022-10-30: 1000 mL via INTRAVENOUS

## 2022-10-30 MED ORDER — ONDANSETRON HCL 4 MG/2ML IJ SOLN
4.0000 mg | Freq: Once | INTRAMUSCULAR | Status: AC
  Administered 2022-10-30: 4 mg via INTRAVENOUS
  Filled 2022-10-30: qty 2

## 2022-10-30 MED ORDER — BUPIVACAINE HCL (PF) 0.25 % IJ SOLN
INTRAMUSCULAR | Status: DC | PRN
  Administered 2022-10-30: 20 mL
  Administered 2022-10-30: 30 mL

## 2022-10-30 MED ORDER — METHYLENE BLUE 1 % INJ SOLN
INTRAVENOUS | Status: DC | PRN
  Administered 2022-10-30: 10 mL

## 2022-10-30 MED ORDER — BUPIVACAINE HCL 0.25 % IJ SOLN
INTRAMUSCULAR | Status: AC
  Filled 2022-10-30: qty 1

## 2022-10-30 MED ORDER — PREDNISONE 5 MG PO TABS
5.0000 mg | ORAL_TABLET | Freq: Every day | ORAL | Status: DC
  Administered 2022-10-31 – 2022-11-03 (×4): 5 mg via ORAL
  Filled 2022-10-30 (×5): qty 1

## 2022-10-30 MED ORDER — ONDANSETRON HCL 4 MG/2ML IJ SOLN: 4.0000 mg | Freq: Four times a day (QID) | INTRAMUSCULAR | Status: DC | PRN

## 2022-10-30 MED ORDER — HYDROMORPHONE HCL 1 MG/ML IJ SOLN
0.5000 mg | INTRAMUSCULAR | Status: DC | PRN
  Administered 2022-10-31 – 2022-11-01 (×3): 0.5 mg via INTRAVENOUS
  Filled 2022-10-30 (×3): qty 0.5

## 2022-10-30 MED ORDER — METHYLENE BLUE 1 % INJ SOLN
INTRAVENOUS | Status: AC
  Filled 2022-10-30: qty 10

## 2022-10-30 MED ORDER — ACETAMINOPHEN 650 MG RE SUPP: 650.0000 mg | Freq: Four times a day (QID) | RECTAL | Status: DC | PRN

## 2022-10-30 MED ORDER — OLANZAPINE 5 MG PO TABS
5.0000 mg | ORAL_TABLET | Freq: Every day | ORAL | Status: DC
  Administered 2022-10-30 – 2022-11-02 (×4): 5 mg via ORAL
  Filled 2022-10-30 (×4): qty 1

## 2022-10-30 MED ORDER — HYDROXYCHLOROQUINE SULFATE 200 MG PO TABS
400.0000 mg | ORAL_TABLET | Freq: Every day | ORAL | Status: DC
  Administered 2022-10-31 – 2022-11-03 (×4): 400 mg via ORAL
  Filled 2022-10-30 (×4): qty 2

## 2022-10-30 MED ORDER — STERILE WATER FOR IRRIGATION IR SOLN
Status: DC | PRN
  Administered 2022-10-30: 1000 mL

## 2022-10-30 MED ORDER — LACTATED RINGERS IV SOLN: INTRAVENOUS | Status: DC

## 2022-10-30 MED ORDER — DEXAMETHASONE SODIUM PHOSPHATE 4 MG/ML IJ SOLN
INTRAMUSCULAR | Status: DC | PRN
  Administered 2022-10-30: 5 mg via INTRAVENOUS

## 2022-10-30 MED ORDER — CEFAZOLIN SODIUM-DEXTROSE 2-4 GM/100ML-% IV SOLN
2.0000 g | Freq: Once | INTRAVENOUS | Status: AC
  Administered 2022-10-30: 2 g via INTRAVENOUS
  Filled 2022-10-30: qty 100

## 2022-10-30 MED ORDER — METHYLPREDNISOLONE SODIUM SUCC 40 MG IJ SOLR
40.0000 mg | Freq: Once | INTRAMUSCULAR | Status: AC
  Administered 2022-10-30: 40 mg via INTRAVENOUS
  Filled 2022-10-30: qty 1

## 2022-10-30 MED ORDER — DIPHENHYDRAMINE HCL 25 MG PO CAPS: 50.0000 mg | ORAL_CAPSULE | Freq: Once | ORAL | Status: DC

## 2022-10-30 MED ORDER — FENTANYL CITRATE (PF) 100 MCG/2ML IJ SOLN
INTRAMUSCULAR | Status: AC
  Filled 2022-10-30: qty 2

## 2022-10-30 MED ORDER — CEFAZOLIN SODIUM-DEXTROSE 1-4 GM/50ML-% IV SOLN: 1.0000 g | Freq: Once | INTRAVENOUS | Status: DC

## 2022-10-30 MED ORDER — FENTANYL CITRATE PF 50 MCG/ML IJ SOSY
25.0000 ug | PREFILLED_SYRINGE | INTRAMUSCULAR | Status: DC | PRN
  Administered 2022-10-30 (×2): 25 ug via INTRAVENOUS

## 2022-10-30 MED ORDER — OXYCODONE HCL 5 MG PO TABS: 5.0000 mg | ORAL_TABLET | Freq: Once | ORAL | Status: DC | PRN

## 2022-10-30 MED ORDER — OXYCODONE HCL 5 MG/5ML PO SOLN: 5.0000 mg | Freq: Once | ORAL | Status: DC | PRN

## 2022-10-30 MED ORDER — SODIUM CHLORIDE 0.9 % IV SOLN: INTRAVENOUS | Status: AC

## 2022-10-30 MED ORDER — PHENYLEPHRINE HCL (PRESSORS) 10 MG/ML IV SOLN
INTRAVENOUS | Status: AC
  Filled 2022-10-30: qty 1

## 2022-10-30 MED ORDER — PHENYLEPHRINE HCL-NACL 20-0.9 MG/250ML-% IV SOLN
INTRAVENOUS | Status: DC | PRN
  Administered 2022-10-30: 25 ug/min via INTRAVENOUS

## 2022-10-30 MED ORDER — ACETAMINOPHEN 325 MG PO TABS
650.0000 mg | ORAL_TABLET | Freq: Four times a day (QID) | ORAL | Status: DC | PRN
  Filled 2022-10-30: qty 2

## 2022-10-30 MED ORDER — DIPHENHYDRAMINE HCL 50 MG/ML IJ SOLN: 50.0000 mg | Freq: Once | INTRAMUSCULAR | Status: DC

## 2022-10-30 MED ORDER — ROCURONIUM BROMIDE 100 MG/10ML IV SOLN
INTRAVENOUS | Status: DC | PRN
  Administered 2022-10-30: 40 mg via INTRAVENOUS

## 2022-10-30 MED ORDER — LEFLUNOMIDE 10 MG PO TABS
20.0000 mg | ORAL_TABLET | Freq: Every day | ORAL | Status: DC
  Administered 2022-10-31 – 2022-11-03 (×4): 20 mg via ORAL
  Filled 2022-10-30 (×4): qty 2

## 2022-10-30 MED ORDER — ETOMIDATE 2 MG/ML IV SOLN
INTRAVENOUS | Status: DC | PRN
  Administered 2022-10-30: 20 mg via INTRAVENOUS

## 2022-10-30 MED ORDER — SODIUM ZIRCONIUM CYCLOSILICATE 10 G PO PACK
10.0000 g | PACK | Freq: Once | ORAL | Status: AC
  Administered 2022-10-30: 10 g via ORAL
  Filled 2022-10-30: qty 1

## 2022-10-30 MED ORDER — ROCURONIUM BROMIDE 10 MG/ML (PF) SYRINGE
PREFILLED_SYRINGE | INTRAVENOUS | Status: AC
  Filled 2022-10-30: qty 10

## 2022-10-30 MED ORDER — FENTANYL CITRATE (PF) 100 MCG/2ML IJ SOLN
INTRAMUSCULAR | Status: DC | PRN
  Administered 2022-10-30 (×2): 50 ug via INTRAVENOUS

## 2022-10-30 MED ORDER — BENZTROPINE MESYLATE 0.5 MG PO TABS
0.5000 mg | ORAL_TABLET | Freq: Two times a day (BID) | ORAL | Status: DC
  Administered 2022-10-30 – 2022-11-03 (×8): 0.5 mg via ORAL
  Filled 2022-10-30 (×9): qty 1

## 2022-10-30 MED ORDER — SUCCINYLCHOLINE CHLORIDE 200 MG/10ML IV SOSY
PREFILLED_SYRINGE | INTRAVENOUS | Status: DC | PRN
  Administered 2022-10-30: 100 mg via INTRAVENOUS

## 2022-10-30 MED ORDER — 0.9 % SODIUM CHLORIDE (POUR BTL) OPTIME
TOPICAL | Status: DC | PRN
  Administered 2022-10-30 (×2): 1000 mL

## 2022-10-30 MED ORDER — MORPHINE SULFATE (PF) 4 MG/ML IV SOLN
4.0000 mg | Freq: Once | INTRAVENOUS | Status: AC
  Administered 2022-10-30: 4 mg via INTRAVENOUS
  Filled 2022-10-30: qty 1

## 2022-10-30 MED ORDER — ESCITALOPRAM OXALATE 10 MG PO TABS
15.0000 mg | ORAL_TABLET | Freq: Every day | ORAL | Status: DC
  Administered 2022-10-31 – 2022-11-03 (×4): 15 mg via ORAL
  Filled 2022-10-30: qty 2
  Filled 2022-10-30: qty 1
  Filled 2022-10-30: qty 2

## 2022-10-30 MED ORDER — METOPROLOL SUCCINATE ER 25 MG PO TB24
25.0000 mg | ORAL_TABLET | Freq: Every day | ORAL | Status: DC
  Administered 2022-10-30 – 2022-11-03 (×5): 25 mg via ORAL
  Filled 2022-10-30 (×6): qty 1

## 2022-10-30 MED ORDER — FENTANYL CITRATE PF 50 MCG/ML IJ SOSY
PREFILLED_SYRINGE | INTRAMUSCULAR | Status: AC
  Filled 2022-10-30: qty 1

## 2022-10-30 SURGICAL SUPPLY — 59 items
APL PRP STRL LF DISP 70% ISPRP (MISCELLANEOUS) ×1
ATTRACTOMAT 16X20 MAGNETIC DRP (DRAPES) IMPLANT
BAG COUNTER SPONGE SURGICOUNT (BAG) IMPLANT
BAG SPNG CNTER NS LX DISP (BAG)
BLADE EXTENDED COATED 6.5IN (ELECTRODE) ×2 IMPLANT
BLADE HEX COATED 2.75 (ELECTRODE) ×2 IMPLANT
CATH FOLEY 2WAY SLVR  5CC 16FR (CATHETERS) ×1
CATH FOLEY 2WAY SLVR 30CC 20FR (CATHETERS) IMPLANT
CATH FOLEY 2WAY SLVR 5CC 16FR (CATHETERS) ×2 IMPLANT
CHLORAPREP W/TINT 26 (MISCELLANEOUS) ×2 IMPLANT
CLIP LIGATING HEM O LOK PURPLE (MISCELLANEOUS) ×8 IMPLANT
CLIP LIGATING HEMO O LOK GREEN (MISCELLANEOUS) ×8 IMPLANT
CLIP TI LARGE 6 (CLIP) IMPLANT
COVER BACK TABLE 60X90IN (DRAPES) ×2 IMPLANT
COVER SURGICAL LIGHT HANDLE (MISCELLANEOUS) ×2 IMPLANT
DISSECTOR ROUND CHERRY 3/8 STR (MISCELLANEOUS) ×2 IMPLANT
DRAIN CHANNEL 10F 3/8 F FF (DRAIN) IMPLANT
DRAIN PENROSE 0.5X18 (DRAIN) IMPLANT
DRAPE LAPAROSCOPIC ABDOMINAL (DRAPES) IMPLANT
DRAPE LAPAROTOMY TRNSV 102X78 (DRAPES) IMPLANT
DRAPE WARM FLUID 44X44 (DRAPES) ×2 IMPLANT
ELECT REM PT RETURN 15FT ADLT (MISCELLANEOUS) ×2 IMPLANT
EVACUATOR SILICONE 100CC (DRAIN) IMPLANT
GAUZE 4X4 16PLY ~~LOC~~+RFID DBL (SPONGE) ×2 IMPLANT
GAUZE SPONGE 4X4 12PLY STRL (GAUZE/BANDAGES/DRESSINGS) ×4 IMPLANT
GLOVE SURG LX STRL 7.5 STRW (GLOVE) ×2 IMPLANT
GOWN STRL REUS W/ TWL LRG LVL3 (GOWN DISPOSABLE) ×2 IMPLANT
GOWN STRL REUS W/TWL LRG LVL3 (GOWN DISPOSABLE) ×1
HEMOSTAT SURGICEL 4X8 (HEMOSTASIS) IMPLANT
KIT BASIN OR (CUSTOM PROCEDURE TRAY) ×2 IMPLANT
KIT TURNOVER KIT A (KITS) IMPLANT
LOOP VESSEL MAXI BLUE (MISCELLANEOUS) ×2 IMPLANT
NS IRRIG 1000ML POUR BTL (IV SOLUTION) ×4 IMPLANT
PACK GENERAL/GYN (CUSTOM PROCEDURE TRAY) ×2 IMPLANT
PROTECTOR NERVE ULNAR (MISCELLANEOUS) ×4 IMPLANT
SPIKE FLUID TRANSFER (MISCELLANEOUS) IMPLANT
SPONGE SURGIFOAM ABS GEL 100 (HEMOSTASIS) ×2 IMPLANT
STAPLER VISISTAT 35W (STAPLE) ×2 IMPLANT
SUT CHROMIC 0 UR 5 27 (SUTURE) ×4 IMPLANT
SUT CHROMIC 2 0 UR 5 27 (SUTURE) ×4 IMPLANT
SUT PDS AB 0 CT1 36 (SUTURE) IMPLANT
SUT PDS AB 1 TP1 96 (SUTURE) ×4 IMPLANT
SUT SILK 1 TIES 10/18 (SUTURE) ×2 IMPLANT
SUT SILK 2 0 (SUTURE) ×2
SUT SILK 2 0 30  PSL (SUTURE)
SUT SILK 2 0 30 PSL (SUTURE) IMPLANT
SUT SILK 2-0 18XBRD TIE 12 (SUTURE) ×4 IMPLANT
SUT VIC AB 2-0 CT1 27 (SUTURE) ×1
SUT VIC AB 2-0 CT1 TAPERPNT 27 (SUTURE) IMPLANT
SUT VIC AB 2-0 SH 27 (SUTURE) ×1
SUT VIC AB 2-0 SH 27X BRD (SUTURE) IMPLANT
SUT VIC AB 3-0 SH 27 (SUTURE) ×1
SUT VIC AB 3-0 SH 27X BRD (SUTURE) IMPLANT
SYR 10ML LL (SYRINGE) ×2 IMPLANT
TAPE CLOTH SURG 4X10 WHT LF (GAUZE/BANDAGES/DRESSINGS) ×2 IMPLANT
TAPE UMBILICAL 1/8 X36 TWILL (MISCELLANEOUS) ×2 IMPLANT
TUBING CONNECTING 10 (TUBING) ×2 IMPLANT
URINEMETER 350 (MISCELLANEOUS) ×2 IMPLANT
WATER STERILE IRR 1000ML POUR (IV SOLUTION) ×2 IMPLANT

## 2022-10-30 NOTE — ED Triage Notes (Signed)
Pt bib ems from [facility] c/o abdominal pain "on and off for a few days" Pt reports vomiting episode yesterday states currently feels nauseous.

## 2022-10-30 NOTE — Transfer of Care (Signed)
Immediate Anesthesia Transfer of Care Note  Patient: [name]  Procedure(s) Performed: BLADDER REPAIR  Patient Location: PACU  Anesthesia Type:General  Level of Consciousness: awake, alert , and oriented  Airway & Oxygen Therapy: Patient Spontanous Breathing and Patient connected to face mask oxygen  Post-op Assessment: Report given to RN and Post -op Vital signs reviewed and stable  Post vital signs: Reviewed and stable  Last Vitals:  Vitals Value Taken Time  BP 163/138 10/30/22 1922  Temp    Pulse 110 10/30/22 1923  Resp 28 10/30/22 1923  SpO2 94 % 10/30/22 1923  Vitals shown include unvalidated device data.  Last Pain:  Vitals:   10/30/22 1650  TempSrc: Oral  PainSc: 8          Complications: No notable events documented.

## 2022-10-30 NOTE — ED Provider Notes (Signed)
  Physical Exam  BP (!) 146/66   Pulse 84   Temp 98.3 F (36.8 C) (Oral)   Resp (!) 22   Ht  (1.6 m)   Wt 73.9 kg   SpO2 94%   BMI 28.86 kg/m   Physical Exam  Procedures  Ultrasound ED Peripheral IV (Provider)  Date/Time: 10/30/2022 9:40 AM  Performed by: Darrick Grinder, PA-C Authorized by: Darrick Grinder, PA-C   Procedure details:    Indications: poor IV access     Skin Prep: chlorhexidine gluconate     Location: Right upper arm.   Angiocath:  20 G   Bedside Ultrasound Guided: Yes     Patient tolerated procedure without complications: Yes     Dressing applied: Yes     ED Course / MDM    Medical Decision Making Amount and/or Complexity of Data Reviewed Labs: ordered. Radiology: ordered.  Risk Prescription drug management.         Darrick Grinder, PA-C 10/30/22 1610    Gwyneth Sprout, MD 11/05/22 [phone]

## 2022-10-30 NOTE — Anesthesia Procedure Notes (Signed)
Procedure Name: Intubation Date/Time: 10/30/2022 6:10 PM  Performed by: Deri Fuelling, CRNAPre-anesthesia Checklist: Patient identified, Emergency Drugs available, Suction available and Patient being monitored Patient Re-evaluated:Patient Re-evaluated prior to induction Oxygen Delivery Method: Circle system utilized Preoxygenation: Pre-oxygenation with 100% oxygen Induction Type: IV induction Ventilation: Mask ventilation without difficulty Laryngoscope Size: Mac and 3 Tube type: Oral Tube size: 7.0 mm Number of attempts: 1 Airway Equipment and Method: Stylet and Oral airway Placement Confirmation: ETT inserted through vocal cords under direct vision, positive ETCO2 and breath sounds checked- equal and bilateral Secured at: 21 cm Tube secured with: Tape Dental Injury: Teeth and Oropharynx as per pre-operative assessment

## 2022-10-30 NOTE — H&P (Signed)
History and Physical    [name]:[id number] DOB: 1946-01-29 DOA: 10/30/2022  PCP: Clayborn Heron, MD   Patient coming from: SNF   Chief Complaint: Abdominal pain    HPI: [name] is a 77 y.o. 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  Patient reports at least 2 days of intermittent abdominal discomfort.  Pain can become severe at times, is generalized, associated with nausea and an episode of hematuria.  She is unable to identify alleviating or exacerbating factors.  ED Course: Upon arrival to the ED, patient is found to be afebrile and saturating mid 90s on room air initially with elevated heart rate and stable blood pressure.  Labs are most notable for creatinine 3.28, potassium 5.2, hemoglobin 10.1, normal WBC, and normal lactic acid.  CT of the abdomen pelvis demonstrates free fluid in the abdomen and abnormal urinary bladder appearance.  This was followed by CT cystogram which demonstrates focal defect in her bladder with contrast from cystogram extending outside of the bladder.  Urology was consulted by the ED physician, took the patient to the OR for exploratory laparotomy with partial cystectomy and cystorrhaphy.  They requested medical admission.  Review of Systems:  All other systems reviewed and apart from HPI, are negative.  Past Medical History:  Diagnosis Date   Anemia    Anxiety    Arthritis    rhematoid,osteoarthritis   Asthma    mild   Cancer    kidney   Diabetes mellitus without complication    Dyspnea    increased exertion   Elevated cholesterol    GERD (gastroesophageal reflux disease)    Hemorrhoids    Hypertension    Hypothyroidism    Joint pain    Nocturia    Pneumonia    Sleeping difficulties     Past Surgical  History:  Procedure Laterality Date   ABDOMINAL HYSTERECTOMY     ANTERIOR CERVICAL DECOMP/DISCECTOMY FUSION N/A 06/15/2021   Procedure: ANTERIOR CERVICAL DECOMPRESSION/DISCECTOMY FUSION CERVICAL THREE-FOUR;  Surgeon: Lisbeth Renshaw, MD;  Location: [facility] OR;  Service: Neurosurgery;  Laterality: N/A;   BACK SURGERY     CERVICAL FUSION  2004   CHOLECYSTECTOMY     CYSTOSCOPY N/A 10/07/2022   Procedure: CYSTOSCOPY;  Surgeon: Rene Paci, MD;  Location: [facility];  Service: Urology;  Laterality: N/A;   EYE SURGERY Left    cataract   PARS PLANA REPAIR OF RETINAL DEATACHMENT     ROBOT ASSITED LAPAROSCOPIC NEPHROURETERECTOMY Left 12/26/2020   Procedure: XI ROBOT ASSITED LAPAROSCOPIC  LEFT NEPHROURETERECTOMY/ STENT PLACEMENT/ TRANSURETHRAL RESECTION OF LEFT URETERAL ORIFICE;EXCISION OF SPLENULE;  Surgeon: Rene Paci, MD;  Location: [facility];  Service: Urology;  Laterality: Left;   THYROIDECTOMY, PARTIAL      Social History:   reports that she has never smoked. She has never used smokeless tobacco. She reports that she does not drink alcohol and does not use drugs.  Allergies  Allergen Reactions   Contrast Media [Iodinated Contrast Media] Anaphylaxis, Hives and Other (See Comments)    Only the "retina dye"   Abilify [Aripiprazole] Other (See Comments)    Pt does not recall reaction   Ultram [Tramadol Hcl] Other (See Comments)    Reaction unknown   Zithromax [Azithromycin] Anxiety and Other (See Comments)    Heightens anxiety    Family  History  Problem Relation Age of Onset   Diabetes Mother    Breast cancer Mother    Gout Mother    Alzheimer's disease Father    Heart Problems Father    Breast cancer Sister    Alzheimer's disease Sister    Diabetes Sister    Gout Sister    Heart Problems Sister    Alzheimer's disease Brother    Diabetes Brother    Heart Problems Brother      Prior to Admission medications   Medication Sig Start Date End Date Taking?  Authorizing Provider  acetaminophen (TYLENOL) 500 MG tablet Take 1,000 mg by mouth every 6 (six) hours as needed for moderate pain or fever.   Yes [provider]  aspirin 81 MG chewable tablet Chew 81 mg by mouth daily. 05/24/21  Yes [provider]  Benzocaine-Menthol (SORE THROAT) 15-3.6 MG LOZG Use as directed 1 lozenge in the mouth or throat every 2 (two) hours as needed (sore throat).   Yes [provider]  benztropine (COGENTIN) 0.5 MG tablet Take 0.5 mg by mouth 2 (two) times daily.   Yes [provider]  Cholecalciferol (VITAMIN D) 50 MCG (2000 UT) CAPS Take 2,000 Units by mouth daily.   Yes [provider]  ENULOSE 10 GM/15ML SOLN Take 10 g by mouth daily. 07/25/22  Yes [provider]  escitalopram (LEXAPRO) 10 MG tablet Take 15 mg by mouth daily.   Yes [provider]  ferrous gluconate (FERGON) 324 MG tablet Take 324 mg by mouth 3 (three) times a week.   Yes [provider]  hydroxychloroquine (PLAQUENIL) 200 MG tablet Take 400 mg by mouth daily.   Yes [provider]  hydrOXYzine (VISTARIL) 50 MG capsule Take 50 mg by mouth See admin instructions. Take 50 mg by  mouth in the morning and bedtime, may also take an additional 50 mg during the day as needed 09/17/22  Yes [provider]  leflunomide (ARAVA) 20 MG tablet Take 20 mg by mouth daily.   Yes [provider]  loratadine (CLARITIN) 10 MG tablet Take 1 tablet (10 mg total) by mouth daily. 08/22/22  Yes Achille Rich, PA-C  melatonin 5 MG TABS Take 5 mg by mouth at bedtime.   Yes [provider]  metoprolol succinate (TOPROL-XL) 25 MG 24 hr tablet Take 25 mg by mouth daily.   Yes [provider]  OLANZapine (ZYPREXA) 5 MG tablet Take 5 mg by mouth daily.   Yes [provider]  polyethylene glycol (MIRALAX / GLYCOLAX) 17 g packet Take 17 g by mouth 2 (two) times daily. 12/31/20  Yes Regalado, Belkys A, MD   predniSONE (DELTASONE) 5 MG tablet Take 5 mg by mouth daily with breakfast.   Yes [provider]  vitamin C (ASCORBIC ACID) 250 MG tablet Take 250 mg by mouth 3 (three) times a week.   Yes [provider]  ACCU-CHEK GUIDE test strip Check blood sugar every morning 10/29/17   [provider]  Dextromethorphan-guaiFENesin 5-100 MG/5ML LIQD Take 20 mLs by mouth every 4 (four) hours as needed (cough/congestion). 09/10/22   [provider]  HYDROcodone-acetaminophen (NORCO/VICODIN) 5-325 MG tablet Take 1 tablet by mouth every 4 (four) hours as needed for moderate pain.    [provider]  ipratropium (ATROVENT) 0.03 % nasal spray Place 2 sprays into both nostrils 2 (two) times daily as needed for rhinitis. 05/16/22   [provider]  loperamide (IMODIUM A-D)  2 MG tablet Take 2 mg by mouth as needed for diarrhea or loose stools.    [provider]  nystatin cream (MYCOSTATIN) Apply 1 Application topically 2 (two) times daily as needed (redness under abdominal folds and/or under breast). 07/24/22   [provider]  trimethoprim-polymyxin b (POLYTRIM) ophthalmic solution Place 1 drop into both eyes every 4 (four) hours. While awake Patient not taking: Reported on 10/01/2022 08/22/22   Achille Rich, [place]    Physical Exam: Vitals:   10/30/22 1930 10/30/22 1945 10/30/22 2000 10/30/22 2015  BP: (!) 160/61 (!) 144/70 139/62 (!) 140/63  Pulse: (!) 107 (!) 105 (!) 101 (!) 101  Resp: (!) 31 (!) 26 (!) 24 (!) 22  Temp:      TempSrc:      SpO2: 93% 92% 92% 95%  Weight:      Height:        Constitutional: NAD, no diaphoresis  Eyes: PERTLA, lids and conjunctivae normal ENMT: Mucous membranes are moist. Posterior pharynx clear of any exudate or lesions.   Neck: supple, no masses  Respiratory: no wheezing, no crackles. No accessory muscle use.  Cardiovascular: S1 & S2 heard, regular rate and rhythm. Lower leg swelling b/l.   Abdomen: No  distension, soft. Bowel sounds active.  Musculoskeletal: no clubbing / cyanosis. No joint deformity upper and lower extremities.   Skin: Erythema and crust involving lower legs. Warm, dry, well-perfused. Neurologic: CN 2-12 grossly intact. Moving all extremities. Alert and oriented to person, place, and situation.  Psychiatric: Calm. Cooperative.    Labs and Imaging on Admission: I have personally reviewed following labs and imaging studies  CBC: Recent Labs  Lab 10/30/22 0950 10/30/22 1734  WBC 8.4  --   NEUTROABS 7.0  --   HGB 10.1* 9.9*  HCT 33.1* 29.0*  MCV 96.8  --   PLT 232  --    Basic Metabolic Panel: Recent Labs  Lab 10/30/22 0950 10/30/22 1734  NA 139 137  K 5.2* 5.6*  CL 107  --   CO2 21*  --   GLUCOSE 127*  --   BUN 36*  --   CREATININE 3.28*  --   CALCIUM 7.8*  --    GFR: Estimated Creatinine Clearance: 14.1 mL/min (A) (by C-G formula based on SCr of 3.28 mg/dL (H)). Liver Function Tests: Recent Labs  Lab 10/30/22 0950  AST 23  ALT 29  ALKPHOS 91  BILITOT 0.5  PROT 6.0*  ALBUMIN 3.0*   Recent Labs  Lab 10/30/22 0950  LIPASE 25   No results for input(s): "AMMONIA" in the last 168 hours. Coagulation Profile: No results for input(s): "INR", "PROTIME" in the last 168 hours. Cardiac Enzymes: No results for input(s): "CKTOTAL", "CKMB", "CKMBINDEX", "TROPONINI" in the last 168 hours. BNP (last 3 results) No results for input(s): "PROBNP" in the last 8760 hours. HbA1C: No results for input(s): "HGBA1C" in the last 72 hours. CBG: Recent Labs  Lab 10/30/22 1652  GLUCAP 112*   Lipid Profile: No results for input(s): "CHOL", "HDL", "LDLCALC", "TRIG", "CHOLHDL", "LDLDIRECT" in the last 72 hours. Thyroid Function Tests: No results for input(s): "TSH", "T4TOTAL", "FREET4", "T3FREE", "THYROIDAB" in the last 72 hours. Anemia Panel: No results for input(s): "VITAMINB12", "FOLATE", "FERRITIN", "TIBC", "IRON", "RETICCTPCT" in the last 72 hours. Urine  analysis:    Component Value Date/Time   COLORURINE YELLOW 10/30/2022 1400   APPEARANCEUR CLOUDY (A) 10/30/2022 1400   LABSPEC 1.017 10/30/2022 1400   PHURINE 5.0 10/30/2022 1400  GLUCOSEU NEGATIVE 10/30/2022 1400   HGBUR LARGE (A) 10/30/2022 1400   BILIRUBINUR NEGATIVE 10/30/2022 1400   KETONESUR NEGATIVE 10/30/2022 1400   PROTEINUR 100 (A) 10/30/2022 1400   UROBILINOGEN 0.2 02/01/2011 1327   NITRITE NEGATIVE 10/30/2022 1400   LEUKOCYTESUR MODERATE (A) 10/30/2022 1400   Sepsis Labs: @LABRCNTIP (procalcitonin:4,lacticidven:4) )No results found for this or any previous visit (from the past 240 hour(s)).   Radiological Exams on Admission: CT CYSTOGRAM PELVIS  Result Date: 10/30/2022 CLINICAL DATA:  Bladder lesion. EXAM: CT CYSTOGRAM (CT PELVIS WITH CONTRAST) TECHNIQUE: Multidetector CT imaging through the pelvis was performed after dilute contrast had been introduced into the bladder for the purposes of performing CT cystography. RADIATION DOSE REDUCTION: This exam was performed according to the departmental dose-optimization program which includes automated exposure control, adjustment of the mA and/or kV according to patient size and/or use of iterative reconstruction technique. CONTRAST:  25mL OMNIPAQUE IOHEXOL 300 MG/ML  SOLN COMPARISON:  CT 10/30/2022 noncontrast FINDINGS: Urinary Tract: There is contrast seen in the urinary bladder with a Foley catheter. Bladder wall is thickened and trabeculated. There is an ill-defined filling defect noted in the bladder with a masslike contour but overall mixed density with some contrast centrally measuring overall 3.8 by 3.3 by 5.0 cm. Although this could be a mass, this very well could be a hematoma. There is some high density material in the bladder on the prior noncontrast CT scan. There is extraluminal contrast seen along the left side of the dome with a focal defect seen through the bladder wall along the dome of the left side best on series 6,  image 56. The contrast extends into the adjacent fluid in the pelvis outside the bladder. Some of this is in the extraperitoneal space but other areas in the peritoneal space. This is consistent with a bladder wall injury with an intraperitoneal component. Please see sagittal image 60 of series 8 as well. Bowel:  Visualized bowel in the pelvis is nondilated. Vascular/Lymphatic: Grossly preserved iliac vasculature on this non IV contrast exam. No specific abnormal lymph node enlargement. Reproductive:  No mass or other significant abnormality Other: Once again there is fluid in the extraperitoneal space as well as the peritoneal space, left slightly greater than right. Anasarca. Musculoskeletal: Degenerative changes seen of the spine and pelvis. There is streak artifact related to the spinal fixation hardware. IMPRESSION: Focal defect along the bladder dome left of midline with contrast from cystogram extending outside the bladder wall into the intraperitoneal spaces. The bladder itself is with wall thickening and trabeculation. There is some luminal filling defects which could represent hematoma. Recommend follow-up to exclude underlying lesion correlate with prior intervention. Free fluid in the pelvis both intra peritoneal and extraperitoneal spaces. Critical Value/emergent results were called by telephone at the time of interpretation on 10/30/2022 at 12:11 pm to provider Lorre Nick , who verbally acknowledged these results. Electronically Signed   By: Karen Kays M.D.   On: 10/30/2022 15:17   CT ABDOMEN PELVIS WO CONTRAST  Result Date: 10/30/2022 CLINICAL DATA:  77 year old female with abdominal pain "on and off for a few days). Nausea and vomiting. 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 EXAM: CT ABDOMEN AND PELVIS WITHOUT CONTRAST TECHNIQUE: Multidetector CT imaging of the abdomen and pelvis was performed following the standard protocol without IV contrast. RADIATION DOSE REDUCTION: This exam was  performed according to the departmental dose-optimization program which includes automated exposure control, adjustment of the mA and/or kV according to patient size  and/or use of iterative reconstruction technique. COMPARISON:  Pre nephrectomy CT Abdomen and Pelvis 09/10/2020. FINDINGS: Lower chest: Small layering right pleural effusion is new since 2022. No cardiomegaly or pericardial effusion. Bilateral lung base streaky and platelike opacity most resembles atelectasis and/or scarring, increased bilaterally since 2022. Hepatobiliary: Evidence of gallbladder sludge but no pericholecystic inflammation. Trace perihepatic ascites. But the noncontrast liver is otherwise negative. Pancreas: Negative. Spleen: Small volume left upper quadrant ascites with simple fluid density. Diminutive and otherwise negative spleen. Adrenals/Urinary Tract: Left kidney is absent now. Left adrenal gland remains in place and appears normal. Normal right adrenal gland. Right kidney remains in place. No definite right hydronephrosis, and no right hydroureter. However, the urinary bladder is abnormal. The bladder is indistinct, with surrounding simple density fluid which seems to be mil Multi spatial. Within the bladder is of vague mixed density 4.1 cm rounded mass on coronal image 73, axial image 75. Pelvic phleboliths, but no definite urinary calculus. Stomach/Bowel: Large bowel is decompressed from the splenic flexure distally. Right colon and transverse colon average volume retained stool. Decompressed terminal ileum. Appendix not identified. No dilated small bowel. Small gastric hiatal hernia or phrenic ampulla. Stomach is decompressed. Chronic distal duodenum diverticulum which does not appear significantly changed since 2022, no definite inflammation (series 2, image 35). But there is abnormal ascites, most present in the caudal omentum and mesentery, also partially layering in the gutters. No pneumoperitoneum identified.  Vascular/Lymphatic: Normal caliber abdominal aorta. Aortoiliac calcified atherosclerosis. Vascular patency is not evaluated in the absence of IV contrast. No lymphadenopathy identified. Reproductive: Chronically absent uterus, diminutive or absent ovaries. Other: Motion artifact in the pelvis. No layering pelvis free fluid, but simple fluid density tracking in the lower mesentery and surrounding the urinary bladder (seems partially within the space of Retzius). Chronic pelvic phleboliths. Musculoskeletal: Widespread previous lumbar decompression and fusion with chronic L1-L2 adjacent segment disease. Significant spinal stenosis there, bulky disc osteophyte degeneration. No acute or suspicious osseous lesion identified. Dependent body wall edema, and also some upper abdominal ventral wall edema. No soft tissue gas. IMPRESSION: 1. Abnormal but nonspecific Free Fluid in the abdomen and pelvis. This has simple fluid density. The left upper quadrant, caudal omentum, and the soft tissue spaces surrounding the urinary bladder are most affected (see #2). No overt changes of cirrhosis or portal venous hypertension to explain ascites. 2. Abnormal bladder containing of mixed density nonspecific roughly 4 cm mass. The hyperdense component of this could be blood or soft tissue. Query hematuria, recommend correlation with urinalysis. Given #1 and #2, a bladder rupture is not excluded. 3. New small layering right pleural effusion, and some scattered bilateral body wall edema. But no cardiomegaly. Lung base atelectasis and scarring but no evidence of lung base edema. 4. No discrete bowel abnormality. No pneumoperitoneum. Chronic duodenum of the distal diverticulum. Electronically Signed   By: Odessa Fleming M.D.   On: 10/30/2022 10:59     Assessment/Plan   1. Bladder perforation - She underwent ex lap with partial cystectomy and cystorrhaphy 10/29/21  - Continue Foley and JP drain care, pain-control, clears okay tonight per urology     2. AKI superimposed on CKD IIIa; hyperkalemia   - SCr is 3.28 on admission, up from apparent baseline of ~1.2; potassium is 5.6  - Continue IVF hydration overnight, renally-dose medications, give a dose of Lokelma, repeat chem panel in am   3. Rheumatoid arthritis  - Continue leflunomide, Plaquenil, and prednisone 5 mg qD   4. Hypertension  -  Continue metoprolol    5. Depression, anxiety, cognitive impairment  - Continue Lexapro, Seroquel, and hydroxyzine, use delirium precautions   DVT prophylaxis: SCDs  Code Status: Full  Level of Care: Level of care: Med-Surg Family Communication: none present  Disposition Plan:  Patient is from: SNF  Anticipated d/c is to: SNF  Anticipated d/c date is: 11/02/22  Patient currently: Pending stable postoperative course, advancement of diet  Consults called: Urology  Admission status: Inpatient     Briscoe Deutscher, MD [facility]  10/30/2022, 8:35 PM

## 2022-10-30 NOTE — Consult Note (Addendum)
Urology Consult  Consulting physician: Dr. Lorre Nick Reason for referral: Abdominal pain in the context of recent TURBT  Chief Complaint: Abdominal pain with free fluid in the abdomen and pelvis.  History of Present Illness: [name] is a 77 year old female known to our practice.  She presents to [facility] emergency department today in transfer from her skilled nursing facility with chief complaint of abd pain.  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 On 10/07/2022 during routine cystoscopy, a 2 cm papillary tumor involving the anterior bladder dome was discovered and resected.  She has been experiencing abdominal discomfort for the last 2 days, mild transient hematuria, and feeling of incomplete emptying at times.  On assessment she is alert, oriented, and in clear discomfort.  She states her pain is 8/10.  Her abdomen is diffusely rigid on palpation, though it is difficult to distinguish an acute process from clenching in response to her discomfort.  [facility] was consulted following CT ABD/pelvis showing fluid in the abdomen.  Past Medical History:  Diagnosis Date   Anemia    Anxiety    Arthritis    rhematoid,osteoarthritis   Asthma    mild   Cancer (HCC)    kidney   Diabetes mellitus without complication (HCC)    Dyspnea    increased exertion   Elevated cholesterol    GERD (gastroesophageal reflux disease)    Hemorrhoids    Hypertension    Hypothyroidism    Joint pain    Nocturia    Pneumonia    Sleeping difficulties    Past Surgical History:  Procedure Laterality Date   ABDOMINAL HYSTERECTOMY     ANTERIOR CERVICAL DECOMP/DISCECTOMY FUSION N/A 06/15/2021   Procedure: ANTERIOR CERVICAL DECOMPRESSION/DISCECTOMY FUSION CERVICAL THREE-FOUR;  Surgeon: Lisbeth Renshaw, MD;  Location: [facility] OR;  Service: Neurosurgery;  Laterality: N/A;   BACK SURGERY     CERVICAL  FUSION  2004   CHOLECYSTECTOMY     CYSTOSCOPY N/A 10/07/2022   Procedure: CYSTOSCOPY;  Surgeon: Rene Paci, MD;  Location: [facility];  Service: Urology;  Laterality: N/A;   EYE SURGERY Left    cataract   PARS PLANA REPAIR OF RETINAL DEATACHMENT     ROBOT ASSITED LAPAROSCOPIC NEPHROURETERECTOMY Left 12/26/2020   Procedure: XI ROBOT ASSITED LAPAROSCOPIC  LEFT NEPHROURETERECTOMY/ STENT PLACEMENT/ TRANSURETHRAL RESECTION OF LEFT URETERAL ORIFICE;EXCISION OF SPLENULE;  Surgeon: Rene Paci, MD;  Location: [facility];  Service: Urology;  Laterality: Left;   THYROIDECTOMY, PARTIAL       Allergies:  Allergies  Allergen Reactions   Contrast Media [Iodinated Contrast Media] Anaphylaxis, Hives and Other (See Comments)    Only the "retina dye"   Abilify [Aripiprazole] Other (See Comments)    Pt does not recall reaction   Ultram [Tramadol Hcl] Other (See Comments)    Reaction unknown   Zithromax [Azithromycin] Anxiety and Other (See Comments)    Heightens anxiety    Family History  Problem Relation Age of Onset   Diabetes Mother    Breast cancer Mother    Gout Mother    Alzheimer's disease Father    Heart Problems Father    Breast cancer Sister    Alzheimer's disease Sister    Diabetes Sister    Gout Sister    Heart Problems Sister    Alzheimer's disease Brother    Diabetes Brother    Heart Problems Brother    Social History:  reports that she has never smoked. She has never used smokeless tobacco. She reports that she does not drink alcohol and does not use drugs.   Review of Systems  Gastrointestinal:  Positive for abdominal pain and nausea.  Genitourinary:  Positive for dysuria and hematuria.     Physical Exam:  Vital signs in last 24 hours: Temp:  [98.2 F (36.8 C)-98.3 F (36.8 C)] 98.2 F (36.8 C) (04/18 1204) Pulse Rate:  [84-107] 107 (04/18 1500) Resp:  [22-31] 30 (04/18 1500) BP: (132-146)/(66-80) 138/80 (04/18 1500) SpO2:  [92 %-96 %] 92 %  (04/18 1500) Weight:  [73.9 kg] 73.9 kg (04/18 0839)  Physical Exam Vitals reviewed.  Constitutional:      General: She is in acute distress.     Appearance: She is not ill-appearing, toxic-appearing or diaphoretic.  HENT:     Head: Normocephalic and atraumatic.  Cardiovascular:     Rate and Rhythm: Tachycardia present.  Pulmonary:     Comments: Tachypneic Abdominal:     General: Abdomen is flat. There is no distension.     Palpations: Abdomen is rigid. There is no mass or pulsatile mass.     Tenderness: There is abdominal tenderness in the periumbilical area. There is guarding.  Skin:    General: Skin is warm and dry.  Neurological:     General: No focal deficit present.     Mental Status: She is alert.  Psychiatric:        Mood and Affect: Mood normal.        Behavior: Behavior normal.     Vitals:   10/30/22 1400 10/30/22 1500  BP: (!) 144/75 138/80  Pulse: (!) 102 (!) 107  Resp: (!) 30 (!) 30  Temp:    SpO2: 95% 92%     Laboratory Data:  Results for orders placed or performed during the hospital encounter of 10/30/22 (from the past 72 hour(s))  CBC with Differential/Platelet     Status: Abnormal   Collection Time: 10/30/22  9:50 AM  Result Value Ref Range   WBC 8.4 4.0 - 10.5 K/uL   RBC 3.42 (L) 3.87 - 5.11 MIL/uL   Hemoglobin 10.1 (L) 12.0 - 15.0 g/dL   HCT 40.9 (L) 81.1 - 91.4 %   MCV 96.8 80.0 - 100.0 fL   MCH 29.5 26.0 - 34.0 pg   MCHC 30.5 30.0 - 36.0 g/dL   RDW 78.2 (H) 95.6 - 21.3 %   Platelets 232 150 - 400 K/uL   nRBC 0.0 0.0 - 0.2 %   Neutrophils Relative % 83 %   Neutro Abs 7.0 1.7 - 7.7 K/uL   Lymphocytes Relative 7 %   Lymphs Abs 0.6 (L) 0.7 - 4.0 K/uL   Monocytes Relative 7 %   Monocytes Absolute 0.6 0.1 - 1.0 K/uL   Eosinophils Relative 2 %   Eosinophils Absolute 0.2 0.0 - 0.5 K/uL   Basophils Relative 1 %   Basophils Absolute 0.0 0.0 - 0.1 K/uL   Immature Granulocytes 0 %   Abs Immature Granulocytes 0.03 0.00 - 0.07 K/uL    Comment:  Performed at [facility], [address], [place], [place] [address]  Comprehensive metabolic panel     Status: Abnormal   Collection Time: 10/30/22  9:50 AM  Result Value Ref Range   Sodium 139 135 - 145 mmol/L   Potassium 5.2 (H) 3.5 - 5.1 mmol/L   Chloride 107 98 - 111 mmol/L   CO2 21 (L)  22 - 32 mmol/L   Glucose, Bld 127 (H) 70 - 99 mg/dL    Comment: Glucose reference range applies only to samples taken after fasting for at least 8 hours.   BUN 36 (H) 8 - 23 mg/dL   Creatinine, Ser 9.60 (H) 0.44 - 1.00 mg/dL   Calcium 7.8 (L) 8.9 - 10.3 mg/dL   Total Protein 6.0 (L) 6.5 - 8.1 g/dL   Albumin 3.0 (L) 3.5 - 5.0 g/dL   AST 23 15 - 41 U/L   ALT 29 0 - 44 U/L   Alkaline Phosphatase 91 38 - 126 U/L   Total Bilirubin 0.5 0.3 - 1.2 mg/dL   GFR, Estimated 14 (L) >60 mL/min    Comment: (NOTE) Calculated using the CKD-EPI Creatinine Equation (2021)    Anion gap 11 5 - 15    Comment: Performed at [facility], [address], [place], [place] [address]  Lipase, blood     Status: None   Collection Time: 10/30/22  9:50 AM  Result Value Ref Range   Lipase 25 11 - 51 U/L    Comment: Performed at [facility], [address], [place], [place] [address]  Lactic acid, plasma     Status: None   Collection Time: 10/30/22  9:50 AM  Result Value Ref Range   Lactic Acid, Venous 1.8 0.5 - 1.9 mmol/L    Comment: Performed at [facility], [address] [place], [place] [address]  Lactic acid, plasma     Status: None   Collection Time: 10/30/22  1:32 PM  Result Value Ref Range   Lactic Acid, Venous 1.8 0.5 - 1.9 mmol/L    Comment: Performed at [facility], [address], [place], [place] [address]  Urinalysis, Routine w reflex microscopic -Urine, Clean Catch     Status: Abnormal   Collection Time: 10/30/22  2:00 PM  Result Value Ref Range   Color, Urine YELLOW YELLOW   APPearance CLOUDY (A) CLEAR    Specific Gravity, Urine 1.017 1.005 - 1.030   pH 5.0 5.0 - 8.0   Glucose, UA NEGATIVE NEGATIVE mg/dL   Hgb urine dipstick LARGE (A) NEGATIVE   Bilirubin Urine NEGATIVE NEGATIVE   Ketones, ur NEGATIVE NEGATIVE mg/dL   Protein, ur 086 (A) NEGATIVE mg/dL   Nitrite NEGATIVE NEGATIVE   Leukocytes,Ua MODERATE (A) NEGATIVE   RBC / HPF >50 0 - 5 RBC/hpf   WBC, UA >50 0 - 5 WBC/hpf   Bacteria, UA NONE SEEN NONE SEEN   Squamous Epithelial / HPF 0-5 0 - 5 /HPF   WBC Clumps PRESENT    Mucus PRESENT     Comment: Performed at [facility], [address] [place], [place] [address]   No results found for this or any previous visit (from the past 240 hour(s)). Creatinine: Recent Labs    10/30/22 0950  CREATININE 3.28*    CT: See report/chart   Impression/Assessment:  CT cystogram was recommended following CT ABD/pelvis.  Cystogram again showed roughly 4 x 5 cm mixed density mass within the bladder which could represent tumor, though more likely clot consolidation.  Contrast was seen communicating through the bladder wall at the dome, somewhat posteriorly and extending into the peritoneal and extraperitoneal spaces.  Plan:  - NPO - Have posted case for abdominal exploration with likely closure of bladder defect. - Maintain Foley catheter for approximately 4 weeks with repeat cystogram prior to removal. -  Urology to follow  Scherrie Bateman [place] 10/30/2022, 3:32 PM  I performed a history and physical examination of the patient and discussed the patients management with the nurse practioner.  I reviewed the  note and agree with the documented findings and plan of care.

## 2022-10-30 NOTE — ED Provider Notes (Signed)
[place] [place] EMERGENCY DEPARTMENT AT [facility] Provider Note   CSN: [id number] Arrival date & time: 10/30/22  0825     History  Chief Complaint  Patient presents with   Abdominal Pain    [name] is a 77 y.o. female.  77 year old female who presents with abdominal discomfort times several days.  Started having emesis today which she states is not bilious or bloody.  Denies any fever or chills.  Last BM was yesterday.  No urinary symptoms.  States she has been up probably about 2 or 3 times.  Has nausea.  Symptoms are persistent nothing makes them better or worse no treatment use prior to arrival       Home Medications Prior to Admission medications   Medication Sig Start Date End Date Taking? Authorizing Provider  ACCU-CHEK GUIDE test strip Check blood sugar every morning 10/29/17   [provider]  acetaminophen (TYLENOL) 500 MG tablet Take 1,000 mg by mouth every 6 (six) hours as needed for moderate pain or fever.    [provider]  aspirin 81 MG chewable tablet Chew 81 mg by mouth daily. 05/24/21   [provider]  Benzocaine-Menthol (SORE THROAT) 15-3.6 MG LOZG Use as directed 1 lozenge in the mouth or throat every 2 (two) hours as needed (sore throat).    [provider]  benztropine (COGENTIN) 0.5 MG tablet Take 0.5 mg by mouth 2 (two) times daily.    [provider]  Cholecalciferol (VITAMIN D) 50 MCG (2000 UT) CAPS Take 2,000 Units by mouth daily.    [provider]  Dextromethorphan-guaiFENesin 5-100 MG/5ML LIQD Take 20 mLs by mouth every 4 (four) hours as needed (cough/congestion). 09/10/22   [provider]  ENULOSE 10 GM/15ML SOLN Take 10 g by mouth daily. 07/25/22   [provider]  escitalopram (LEXAPRO) 10 MG tablet Take 15 mg by mouth daily.    [provider]  ferrous gluconate (FERGON) 324 MG tablet Take 324 mg by mouth 3 (three) times a week.    [provider]  HYDROcodone-acetaminophen (NORCO/VICODIN) 5-325 MG tablet Take 1 tablet by mouth every 4 (four) hours as needed for moderate pain.    [provider]  hydroxychloroquine (PLAQUENIL) 200 MG tablet Take 400 mg by mouth daily.    [provider]  hydrOXYzine (VISTARIL) 50 MG capsule Take 50 mg by mouth See admin instructions. Take 50 mg by  mouth in the morning and bedtime, may also take an additional 50 mg during the day as needed 09/17/22   [provider]  ipratropium (ATROVENT) 0.03 % nasal spray Place 2 sprays into both nostrils 2 (two) times daily as needed for rhinitis. 05/16/22   [provider]  leflunomide (ARAVA) 20 MG tablet Take 20 mg by mouth daily.    [provider]  loperamide (IMODIUM A-D) 2 MG tablet Take 2 mg by mouth as needed for diarrhea or loose stools.    [provider]  loratadine (CLARITIN) 10 MG tablet Take 1 tablet (10 mg total) by mouth daily. 08/22/22   Achille Rich, PA-C  melatonin 5 MG TABS Take 5 mg by mouth at bedtime.    [provider]  metoprolol succinate (TOPROL-XL) 25 MG 24 hr tablet Take 25 mg by mouth daily.    [provider]  nystatin cream (MYCOSTATIN) Apply 1 Application topically 2 (two) times daily as needed (redness under abdominal folds and/or under breast). 07/24/22   [provider]  OLANZapine (ZYPREXA) 5 MG tablet Take 5 mg by mouth daily.    [provider]  polyethylene glycol (MIRALAX / GLYCOLAX) 17 g packet Take 17 g by mouth 2 (two) times daily. 12/31/20   Regalado, Belkys A, MD  predniSONE (DELTASONE) 5 MG tablet Take 5 mg by mouth daily with breakfast.    [provider]  trimethoprim-polymyxin b (POLYTRIM) ophthalmic solution Place 1 drop into both eyes every 4 (four) hours. While awake Patient not taking: Reported on 10/01/2022 08/22/22   Achille Rich, PA-C  vitamin C (ASCORBIC ACID) 250 MG tablet Take 250 mg by mouth 3 (three) times  a week.    [provider]      Allergies    Contrast media [iodinated contrast media], Abilify [aripiprazole], Ultram [tramadol hcl], and Zithromax [azithromycin]    Review of Systems   Review of Systems  All other systems reviewed and are negative.   Physical Exam Updated Vital Signs BP (!) 146/66   Pulse 84   Temp 98.3 F (36.8 C) (Oral)   Resp (!) 22   Ht 1.6 m ( )   Wt 73.9 kg   SpO2 94%   BMI 28.86 kg/m  Physical Exam Vitals and nursing note reviewed.  Constitutional:      General: She is not in acute distress.    Appearance: Normal appearance. She is well-developed. She is not toxic-appearing.  HENT:     Head: Normocephalic and atraumatic.  Eyes:     General: Lids are normal.     Conjunctiva/sclera: Conjunctivae normal.     Pupils: Pupils are equal, round, and reactive to light.  Neck:     Thyroid: No thyroid mass.     Trachea: No tracheal deviation.  Cardiovascular:     Rate and Rhythm: Normal rate and regular rhythm.     Heart sounds: Normal heart sounds. No murmur heard.    No gallop.  Pulmonary:     Effort: Pulmonary effort is normal. No respiratory distress.     Breath sounds: Normal breath sounds. No stridor. No decreased breath sounds, wheezing, rhonchi or rales.  Abdominal:     General: There is no distension.     Palpations: Abdomen is soft.     Tenderness: There is abdominal tenderness in the right upper quadrant and epigastric area. There is guarding. There is no rebound.    Musculoskeletal:        General: No tenderness. Normal range of motion.     Cervical back: Normal range of motion and neck supple.  Skin:    General: Skin is warm and dry.     Findings: No abrasion or rash.  Neurological:     Mental Status: She is alert and oriented to person, place, and time. Mental status is at baseline.     GCS: GCS eye subscore is 4. GCS verbal subscore is 5. GCS motor subscore is 6.     Cranial Nerves: No cranial nerve deficit.      Sensory: No sensory deficit.     Motor: Motor function is intact.  Psychiatric:        Attention and Perception: Attention normal.        Speech: Speech normal.        Behavior: Behavior normal.     ED Results / Procedures / Treatments   Labs (all labs ordered are listed, but only abnormal results are displayed) Labs Reviewed  CBC WITH DIFFERENTIAL/PLATELET  COMPREHENSIVE METABOLIC PANEL  LIPASE, BLOOD  URINALYSIS, ROUTINE W REFLEX MICROSCOPIC  LACTIC ACID, PLASMA  LACTIC ACID, PLASMA    EKG None  Radiology No results found.  Procedures Procedures    Medications Ordered in ED Medications  lactated ringers infusion (has no administration in time range)  ondansetron (ZOFRAN) injection 4 mg (has no administration in time range)  morphine (PF) 4 MG/ML injection 4 mg (has no administration in time range)    ED Course/ Medical Decision Making/ A&P                             Medical Decision Making Amount and/or Complexity of Data Reviewed Labs: ordered. Radiology: ordered.  Risk Prescription drug management.  1:01 PM Patient's labs significant for acute kidney injury.  No evidence of severe hyperkalemia. Patient CT results are as noted.  Discussed with urology, Dr. Benancio Deeds, who request that patient have a CT cystogram.  Unfortunate, patient has contrast dye allergy.  Will require 4-hour premedication.  3:23 PM Discussed with radiologist and patient can receive her scan prior to having the 4-hour contrast dye allergy prep due to the nature of her scan.  Patient had this test and shows a bladder rupture.  Discussed with Dr. Benancio Deeds again from urology and patient to go to the OR for repair  CRITICAL CARE Performed by: Toy Baker Total critical care time: 50 minutes Critical care time was exclusive of separately billable procedures and treating other patients. Critical care was necessary to treat or prevent imminent or life-threatening deterioration. Critical  care was time spent personally by me on the following activities: development of treatment plan with patient and/or surrogate as well as nursing, discussions with consultants, evaluation of patient's response to treatment, examination of patient, obtaining history from patient or surrogate, ordering and performing treatments and interventions, ordering and review of laboratory studies, ordering and review of radiographic studies, pulse oximetry and re-evaluation of patient's condition.         Final Clinical Impression(s) / ED Diagnoses Final diagnoses:  None    Rx / DC Orders ED Discharge Orders     None         Lorre Nick, MD 10/30/22 1524

## 2022-10-30 NOTE — Op Note (Addendum)
Operative Note  Preoperative diagnosis:  1.  Intraperitoneal bladder perforation 2.  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  Intraperitoneal bladder perforation 2.  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  Exploratory laparotomy with partial cystectomy and cystorrhaphy  Surgeon: Rhoderick Moody, MD  Assistants: Karoline Caldwell, MD  An assistant was required for this surgical procedure.  The duties of the assistant included but were not limited to suctioning, passing suture, camera manipulation, retraction.  This procedure would not be able to be performed without an Geophysicist/field seismologist.   Anesthesia:  General  Complications:  None  EBL: 50 mL  Specimens: 1.  Bladder resection margin  Drains/Catheters: 1.  20 French two-way Foley catheter with 20 mill of sterile water in the balloon 2.  Right lower quadrant JP drain  Intraoperative findings:   Punctate intraperitoneal bladder perforation involving the bladder dome Watertight cystorrhaphy following methylene blue instillation  Indication:  [name] is a 77 y.o. female with a history of high-grade urothelial carcinoma involving the left renal pelvis, status post left nephro ureterectomy in 2022 who subsequently have a bladder tumor recurrence that required TURBT with gemcitabine instillation on 10/07/2022.  The patient was initially seen 1 week postoperatively and had no voiding complaints or issues with abdominal pain.  3 days ago, the patient developed progressively worsening abdominal pain along with gross hematuria.  CT abdomen/pelvis and CT cystogram revealed evidence of free fluid and signs of an intraperitoneal bladder perforation necessitating open repair.  She has been consented for the above procedures, voices  understanding and wishes to proceed.  Description of procedure:  After informed consent was obtained, the patient was brought to the operating room and general endotracheal anesthesia was administered.  The patient was placed in the dorsolithotomy position and prepped and draped in usual sterile fashion.  A timeout was then performed.  The previously placed 14 French Foley catheter was then exchanged for a 20 [place] Foley catheter.  A 10 cm lower midline incision was made down to the rectus fascia, which was then incised using electrocautery.  The space of Retzius was then bluntly developed.  Her Foley catheter was then instilled with approximately 250 mL of sterile water and a small leak could be observed in the posterior bladder dome.  An anterior cystotomy was made and the bladder was bivalved revealing slightly necrotic areas from her previous TURBT site but no obvious through and through hole in the bladder.  Small margins of bladder surrounding her previous TURBT site was excised and sent for pathologic analysis.  Her bladder was then closed in 2 layers and found to be watertight after 300 cc of sterile water and methylene blue were instilled.  A right lower quadrant JP drain was then placed to bulb suction.  Her rectus fascia was then closed using a 0 PDS in a running fashion.  Her skin was then reapproximated with a 2-0 Vicryl in the subcutaneous layer and her skin was reapproximated using skin staples.  All incisions were then dressed appropriately.  She tolerated the procedure well and was transferred to the postanesthesia in stable condition.  Plan: Monitor JP and Foley output on the floor

## 2022-10-30 NOTE — Anesthesia Preprocedure Evaluation (Signed)
Anesthesia Evaluation  Patient identified by MRN, date of birth, ID band Patient awake    Reviewed: Allergy & Precautions, H&P , NPO status , Patient's Chart, lab work & pertinent test results  Airway Mallampati: II   Neck ROM: full    Dental   Pulmonary shortness of breath, asthma    breath sounds clear to auscultation       Cardiovascular hypertension,  Rhythm:regular Rate:Normal     Neuro/Psych  PSYCHIATRIC DISORDERS Anxiety Depression       GI/Hepatic ,GERD  ,,  Endo/Other  diabetes, Type 2Hypothyroidism    Renal/GU Renal InsufficiencyRenal diseaseCr 3.28   Perforated bladder    Musculoskeletal  (+) Arthritis ,    Abdominal   Peds  Hematology   Anesthesia Other Findings   Reproductive/Obstetrics                             Anesthesia Physical Anesthesia Plan  ASA: 3  Anesthesia Plan: General   Post-op Pain Management:    Induction: Intravenous  PONV Risk Score and Plan: 3 and Ondansetron, Dexamethasone and Treatment may vary due to age or medical condition  Airway Management Planned: Oral ETT  Additional Equipment:   Intra-op Plan:   Post-operative Plan: Extubation in OR  Informed Consent: I have reviewed the patients History and Physical, chart, labs and discussed the procedure including the risks, benefits and alternatives for the proposed anesthesia with the patient or authorized representative who has indicated his/her understanding and acceptance.     Dental advisory given  Plan Discussed with: CRNA, Anesthesiologist and Surgeon  Anesthesia Plan Comments:        Anesthesia Quick Evaluation

## 2022-10-30 NOTE — ED Notes (Signed)
Pt has poor veins and swelling to arms.  Getting IV [place]

## 2022-10-31 ENCOUNTER — Encounter (HOSPITAL_COMMUNITY): Payer: Self-pay | Admitting: Urology

## 2022-10-31 DIAGNOSIS — R109 Unspecified abdominal pain: Secondary | ICD-10-CM | POA: Diagnosis not present

## 2022-10-31 LAB — MAGNESIUM: Magnesium: 1.9 mg/dL (ref 1.7–2.4)

## 2022-10-31 LAB — BASIC METABOLIC PANEL
Anion gap: 7 (ref 5–15)
BUN: 31 mg/dL — ABNORMAL HIGH (ref 8–23)
CO2: 22 mmol/L (ref 22–32)
Calcium: 7.1 mg/dL — ABNORMAL LOW (ref 8.9–10.3)
Chloride: 107 mmol/L (ref 98–111)
Creatinine, Ser: 2.05 mg/dL — ABNORMAL HIGH (ref 0.44–1.00)
GFR, Estimated: 25 mL/min — ABNORMAL LOW (ref >60)
Glucose, Bld: 148 mg/dL — ABNORMAL HIGH (ref 70–99)
Potassium: 4.8 mmol/L (ref 3.5–5.1)
Sodium: 136 mmol/L (ref 135–145)

## 2022-10-31 LAB — CBC
HCT: 26.9 % — ABNORMAL LOW (ref 36.0–46.0)
Hemoglobin: 8.2 g/dL — ABNORMAL LOW (ref 12.0–15.0)
MCH: 29.7 pg (ref 26.0–34.0)
MCHC: 30.5 g/dL (ref 30.0–36.0)
MCV: 97.5 fL (ref 80.0–100.0)
Platelets: 165 10*3/uL (ref 150–400)
RBC: 2.76 MIL/uL — ABNORMAL LOW (ref 3.87–5.11)
RDW: 15.4 % (ref 11.5–15.5)
WBC: 7.2 10*3/uL (ref 4.0–10.5)
nRBC: 0 % (ref 0.0–0.2)

## 2022-10-31 LAB — CREATININE, FLUID (PLEURAL, PERITONEAL, JP DRAINAGE): Creat, Fluid: 1.6 mg/dL

## 2022-10-31 MED ORDER — CHLORHEXIDINE GLUCONATE CLOTH 2 % EX PADS
6.0000 | MEDICATED_PAD | Freq: Every day | CUTANEOUS | Status: DC
  Administered 2022-11-01 – 2022-11-03 (×3): 6 via TOPICAL

## 2022-10-31 NOTE — Progress Notes (Signed)
PROGRESS NOTE    [name]  WUJ:[id number] DOB: Feb 08, 1946 DOA: 10/30/2022 PCP: Clayborn Heron, MD   Brief Narrative: 77 y.o. 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   Patient reports at least 2 days of intermittent abdominal discomfort.  Pain can become severe at times, is generalized, associated with nausea and an episode of hematuria.  She is unable to identify alleviating or exacerbating factors.   ED Course: Upon arrival to the ED, patient is found to be afebrile and saturating mid 90s on room air initially with elevated heart rate and stable blood pressure.  Labs are most notable for creatinine 3.28, potassium 5.2, hemoglobin 10.1, normal WBC, and normal lactic acid.  CT of the abdomen pelvis demonstrates free fluid in the abdomen and abnormal urinary bladder appearance.  This was followed by CT cystogram which demonstrates focal defect in her bladder with contrast from cystogram extending outside of the bladder.   Urology was consulted by the ED physician, took the patient to the OR for exploratory laparotomy with partial cystectomy and cystorrhaphy.   Assessment & Plan:   Principal Problem:   Bladder injury Active Problems:   Rheumatoid arthritis (HCC)   Hypertension   MDD (major depressive disorder), recurrent episode, severe   Cognitive impairment   Anxiety   Abdominal pain   Abdominal fluid collection   H/O transurethral resection of bladder tumor (TURBT)   Acute renal failure superimposed on stage 3a chronic kidney disease   1. Bladder perforation - She underwent ex lap with partial cystectomy and cystorrhaphy 10/29/21  - Continue Foley and JP drain care, pain-control   2. AKI superimposed on CKD IIIa; hyperkalemia   - SCr is  2.05 from 3.28 on  admission, up from baseline of ~1.2; potassium is 4.8 from 5.6 after lokelma  - Continue IVF hydration   3. Rheumatoid arthritis  - Continue leflunomide, Plaquenil, and prednisone 5 mg qD    4. Hypertension  - Continue metoprolol     5. Depression, anxiety, cognitive impairment  - Continue Lexapro, Seroquel, and hydroxyzine, use delirium precautions     Estimated body mass index is 28.86 kg/m as calculated from the following:   Height as of this encounter:  (1.6 m).   Weight as of this encounter: 73.9 kg.  DVT prophylaxis: none due to hematuria and bladder perforation Code Status: Full code  family Communication: None at bedside Disposition Plan:  Status is: Inpatient Remains inpatient appropriate because: AKI hematuria bladder perforation   Consultants:  Urology  Procedures: exploratory laparotomy with partial cystectomy and cystorrhaphy.   Antimicrobials: none  Subjective: Complains of abdominal pain JP drain in place with blood-tinged drainage Foley in place with clear urine  Objective: Vitals:   10/31/22 0922 10/31/22 1100 10/31/22 1136 10/31/22 1136  BP: (!) 118/55 120/60 121/65 121/65  Pulse: 94  93 93  Resp: 16 16    Temp: 98.5 F (36.9 C) 98 F (36.7 C)  98 F (36.7 C)  TempSrc:  Oral  Oral  SpO2: 93%  90% 90%  Weight:      Height:        Intake/Output Summary (Last 24 hours) at 10/31/2022 1142 Last data filed at 10/31/2022 0900 Gross per 24 hour  Intake 3525.9 ml  Output 950 ml  Net 2575.9 ml   [employer]  10/30/22 0839  Weight: 73.9 kg    Examination:  General exam: Appears in no acute distress Respiratory system: Clear to auscultation. Respiratory effort normal. Cardiovascular system: S1 & S2 heard, RRR. No JVD, murmurs, rubs, gallops or clicks. No pedal edema. Gastrointestinal system: Abdomen is nondistended, soft and tender. No organomegaly or masses felt. Normal bowel sounds heard.  Drain in place [place] nervous system: Alert  and oriented. No focal neurological deficits. Extremities: 1+ edema.   Data Reviewed: I have personally reviewed following labs and imaging studies  CBC: Recent Labs  Lab 10/30/22 0950 10/30/22 1734 10/31/22 0423  WBC 8.4  --  7.2  NEUTROABS 7.0  --   --   HGB 10.1* 9.9* 8.2*  HCT 33.1* 29.0* 26.9*  MCV 96.8  --  97.5  PLT 232  --  165   Basic Metabolic Panel: Recent Labs  Lab 10/30/22 0950 10/30/22 1734 10/31/22 0423  NA 139 137 136  K 5.2* 5.6* 4.8  CL 107  --  107  CO2 21*  --  22  GLUCOSE 127*  --  148*  BUN 36*  --  31*  CREATININE 3.28*  --  2.05*  CALCIUM 7.8*  --  7.1*  MG  --   --  1.9   GFR: Estimated Creatinine Clearance: 22.5 mL/min (A) (by C-G formula based on SCr of 2.05 mg/dL (H)). Liver Function Tests: Recent Labs  Lab 10/30/22 0950  AST 23  ALT 29  ALKPHOS 91  BILITOT 0.5  PROT 6.0*  ALBUMIN 3.0*   Recent Labs  Lab 10/30/22 0950  LIPASE 25   No results for input(s): "AMMONIA" in the last 168 hours. Coagulation Profile: No results for input(s): "INR", "PROTIME" in the last 168 hours. Cardiac Enzymes: No results for input(s): "CKTOTAL", "CKMB", "CKMBINDEX", "TROPONINI" in the last 168 hours. BNP (last 3 results) No results for input(s): "PROBNP" in the last 8760 hours. HbA1C: No results for input(s): "HGBA1C" in the last 72 hours. CBG: Recent Labs  Lab 10/30/22 1652 10/30/22 1945  GLUCAP 112* 137*   Lipid Profile: No results for input(s): "CHOL", "HDL", "LDLCALC", "TRIG", "CHOLHDL", "LDLDIRECT" in the last 72 hours. Thyroid Function Tests: No results for input(s): "TSH", "T4TOTAL", "FREET4", "T3FREE", "THYROIDAB" in the last 72 hours. Anemia Panel: No results for input(s): "VITAMINB12", "FOLATE", "FERRITIN", "TIBC", "IRON", "RETICCTPCT" in the last 72 hours. Sepsis Labs: Recent Labs  Lab 10/30/22 0950 10/30/22 1332  LATICACIDVEN 1.8 1.8    No results found for this or any previous visit (from the past 240 hour(s)).        Radiology Studies: CT CYSTOGRAM PELVIS  Result Date: 10/30/2022 CLINICAL DATA:  Bladder lesion. EXAM: CT CYSTOGRAM (CT PELVIS WITH CONTRAST) TECHNIQUE: Multidetector CT imaging through the pelvis was performed after dilute contrast had been introduced into the bladder for the purposes of performing CT cystography. RADIATION DOSE REDUCTION: This exam was performed according to the departmental dose-optimization program which includes automated exposure control, adjustment of the mA and/or kV according to patient size and/or use of iterative reconstruction technique. CONTRAST:  25mL OMNIPAQUE IOHEXOL 300 MG/ML  SOLN COMPARISON:  CT 10/30/2022 noncontrast FINDINGS: Urinary Tract: There is contrast seen in the urinary bladder with a Foley catheter. Bladder wall is thickened and trabeculated. There is an ill-defined filling defect noted in the bladder with a masslike contour but overall mixed density with some contrast centrally measuring overall 3.8 by 3.3 by 5.0 cm. Although this could be a mass, this very well could  be a hematoma. There is some high density material in the bladder on the prior noncontrast CT scan. There is extraluminal contrast seen along the left side of the dome with a focal defect seen through the bladder wall along the dome of the left side best on series 6, image 56. The contrast extends into the adjacent fluid in the pelvis outside the bladder. Some of this is in the extraperitoneal space but other areas in the peritoneal space. This is consistent with a bladder wall injury with an intraperitoneal component. Please see sagittal image 60 of series 8 as well. Bowel:  Visualized bowel in the pelvis is nondilated. Vascular/Lymphatic: Grossly preserved iliac vasculature on this non IV contrast exam. No specific abnormal lymph node enlargement. Reproductive:  No mass or other significant abnormality Other: Once again there is fluid in the extraperitoneal space as well as the peritoneal  space, left slightly greater than right. Anasarca. Musculoskeletal: Degenerative changes seen of the spine and pelvis. There is streak artifact related to the spinal fixation hardware. IMPRESSION: Focal defect along the bladder dome left of midline with contrast from cystogram extending outside the bladder wall into the intraperitoneal spaces. The bladder itself is with wall thickening and trabeculation. There is some luminal filling defects which could represent hematoma. Recommend follow-up to exclude underlying lesion correlate with prior intervention. Free fluid in the pelvis both intra peritoneal and extraperitoneal spaces. Critical Value/emergent results were called by telephone at the time of interpretation on 10/30/2022 at 12:11 pm to provider Lorre Nick , who verbally acknowledged these results. Electronically Signed   By: Karen Kays M.D.   On: 10/30/2022 15:17   CT ABDOMEN PELVIS WO CONTRAST  Result Date: 10/30/2022 CLINICAL DATA:  77 year old female with abdominal pain "on and off for a few days). Nausea and vomiting. 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 EXAM: CT ABDOMEN AND PELVIS WITHOUT CONTRAST TECHNIQUE: Multidetector CT imaging of the abdomen and pelvis was performed following the standard protocol without IV contrast. RADIATION DOSE REDUCTION: This exam was performed according to the departmental dose-optimization program which includes automated exposure control, adjustment of the mA and/or kV according to patient size and/or use of iterative reconstruction technique. COMPARISON:  Pre nephrectomy CT Abdomen and Pelvis 09/10/2020. FINDINGS: Lower chest: Small layering right pleural effusion is new since 2022. No cardiomegaly or pericardial effusion. Bilateral lung base streaky and platelike opacity most resembles atelectasis and/or scarring, increased bilaterally since 2022. Hepatobiliary: Evidence of gallbladder sludge but no pericholecystic inflammation. Trace perihepatic ascites.  But the noncontrast liver is otherwise negative. Pancreas: Negative. Spleen: Small volume left upper quadrant ascites with simple fluid density. Diminutive and otherwise negative spleen. Adrenals/Urinary Tract: Left kidney is absent now. Left adrenal gland remains in place and appears normal. Normal right adrenal gland. Right kidney remains in place. No definite right hydronephrosis, and no right hydroureter. However, the urinary bladder is abnormal. The bladder is indistinct, with surrounding simple density fluid which seems to be mil Multi spatial. Within the bladder is of vague mixed density 4.1 cm rounded mass on coronal image 73, axial image 75. Pelvic phleboliths, but no definite urinary calculus. Stomach/Bowel: Large bowel is decompressed from the splenic flexure distally. Right colon and transverse colon average volume retained stool. Decompressed terminal ileum. Appendix not identified. No dilated small bowel. Small gastric hiatal hernia or phrenic ampulla. Stomach is decompressed. Chronic distal duodenum diverticulum which does not appear significantly changed since 2022, no definite inflammation (series 2, image 35). But there is  abnormal ascites, most present in the caudal omentum and mesentery, also partially layering in the gutters. No pneumoperitoneum identified. Vascular/Lymphatic: Normal caliber abdominal aorta. Aortoiliac calcified atherosclerosis. Vascular patency is not evaluated in the absence of IV contrast. No lymphadenopathy identified. Reproductive: Chronically absent uterus, diminutive or absent ovaries. Other: Motion artifact in the pelvis. No layering pelvis free fluid, but simple fluid density tracking in the lower mesentery and surrounding the urinary bladder (seems partially within the space of [place]). Chronic pelvic phleboliths. Musculoskeletal: Widespread previous lumbar decompression and fusion with chronic L1-L2 adjacent segment disease. Significant spinal stenosis there, bulky  disc osteophyte degeneration. No acute or suspicious osseous lesion identified. Dependent body wall edema, and also some upper abdominal ventral wall edema. No soft tissue gas. IMPRESSION: 1. Abnormal but nonspecific Free Fluid in the abdomen and pelvis. This has simple fluid density. The left upper quadrant, caudal omentum, and the soft tissue spaces surrounding the urinary bladder are most affected (see #2). No overt changes of cirrhosis or portal venous hypertension to explain ascites. 2. Abnormal bladder containing of mixed density nonspecific roughly 4 cm mass. The hyperdense component of this could be blood or soft tissue. Query hematuria, recommend correlation with urinalysis. Given #1 and #2, a bladder rupture is not excluded. 3. New small layering right pleural effusion, and some scattered bilateral body wall edema. But no cardiomegaly. Lung base atelectasis and scarring but no evidence of lung base edema. 4. No discrete bowel abnormality. No pneumoperitoneum. Chronic duodenum of the distal diverticulum. Electronically Signed   By: Odessa Fleming M.D.   On: 10/30/2022 10:59        Scheduled Meds:  benztropine  0.5 mg Oral BID   escitalopram  15 mg Oral Daily   hydroxychloroquine  400 mg Oral Daily   hydrOXYzine  50 mg Oral BID   leflunomide  20 mg Oral Daily   metoprolol succinate  25 mg Oral Daily   OLANZapine  5 mg Oral QHS   predniSONE  5 mg Oral Q breakfast   Continuous Infusions:   LOS: 1 day    Time spent: 38 min  Alwyn Ren, MD  10/31/2022, 11:42 AM

## 2022-10-31 NOTE — TOC Initial Note (Signed)
Transition of Care [facility]) - Initial/Assessment Note    Patient Details  Name: [name] MRN: [id number] Date of Birth: 07-May-1946  [facility]) CM/SW Contact:    Coralyn Helling, LCSW Phone Number: 10/31/2022, 10:57 AM  Clinical Narrative:      Patient from [facility]. Patient in wheelchair at baseline.    PLAN: TBD PT/OT pending.              Expected Discharge Plan: Assisted Living Barriers to Discharge: Continued Medical Work up   Patient Goals and CMS Choice            Expected Discharge Plan and Services In-house Referral: NA Discharge Planning Services: NA Post Acute Care Choice: NA Living arrangements for the past 2 months: Assisted Living Facility                 DME Arranged: N/A DME Agency: NA       HH Arranged: NA HH Agency: NA        Prior Living Arrangements/Services Living arrangements for the past 2 months: Assisted Living Facility Lives with:: Facility Resident Patient language and need for interpreter reviewed:: Yes Do you feel safe going back to the place where you live?: Yes      Need for Family Participation in Patient Care: Yes (Comment) Care giver support system in place?: Yes (comment) Current home services: DME Criminal Activity/Legal Involvement Pertinent to Current Situation/Hospitalization: No - Comment as needed  Activities of Daily Living Home Assistive Devices/Equipment: Wheelchair ADL Screening (condition at time of admission) Patient's cognitive ability adequate to safely complete daily activities?: Yes Is the patient deaf or have difficulty hearing?: No Does the patient have difficulty seeing, even when wearing glasses/contacts?: No Does the patient have difficulty concentrating, remembering, or making decisions?: No Patient able to express need for assistance with ADLs?: Yes Does the patient have difficulty dressing or bathing?: Yes Independently performs ADLs?: No Communication:  Independent Dressing (OT): Independent Is this a change from baseline?: Pre-admission baseline Grooming: Independent Is this a change from baseline?: Pre-admission baseline Feeding: Independent Bathing: Needs assistance Is this a change from baseline?: Pre-admission baseline Toileting: Independent with device (comment) Is this a change from baseline?: Pre-admission baseline In/Out Bed: Independent with device (comment) Is this a change from baseline?: Pre-admission baseline Walks in Home: Dependent Is this a change from baseline?: Pre-admission baseline Does the patient have difficulty walking or climbing stairs?: Yes Weakness of Legs: Both Weakness of Arms/Hands: Both  Permission Sought/Granted Permission sought to share information with : Family Supports    Share Information with NAME: Fredrik Cove, brother           Emotional Assessment Appearance:: Appears stated age Attitude/Demeanor/Rapport: Unable to Assess Affect (typically observed): Unable to Assess Orientation: : Oriented to Self, Oriented to Place, Oriented to  Time, Oriented to Situation Alcohol / Substance Use: Not Applicable Psych Involvement: No (comment)  Admission diagnosis:  Abdominal pain, unspecified abdominal location [R10.9] Bladder injury [S37.20XA] Patient Active Problem List   Diagnosis Date Noted   Abdominal pain 10/30/2022   Abdominal fluid collection 10/30/2022   H/O transurethral resection of bladder tumor (TURBT) 10/30/2022   Bladder injury 10/30/2022   Acute renal failure superimposed on stage 3a chronic kidney disease 10/30/2022   Cervical myelopathy 06/14/2021   Anxiety    Stage 3a chronic kidney disease (CKD)    Cervical stenosis of spinal canal    Hyperlipidemia associated with type 2 diabetes mellitus 03/14/2021   Acute respiratory failure  with hypoxia 01/01/2021   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   Cognitive impairment 04/17/2019   Degeneration of lumbar intervertebral disc 10/15/2018   MDD  (major depressive disorder), recurrent episode, severe 08/10/2018   MDD (major depressive disorder), recurrent, severe, with psychosis    Altered mental status 05/24/2018   Ketonuria 05/24/2018   Pain in right hand 09/11/2017   Hypertension 03/07/2014   Hyperlipidemia 03/07/2014   Depression 03/07/2014   Spinal stenosis of lumbar region with neurogenic claudication 02/24/2014   Type 2 diabetes mellitus without complication 02/24/2014   Rheumatoid arthritis (HCC) 02/24/2014   Scoliosis of lumbar spine 02/13/2014   PCP:  Clayborn Heron, MD Pharmacy:  No Pharmacies Listed    Social Determinants of Health (SDOH) Social History: SDOH Screenings   Alcohol Screen: Low Risk  (08/10/2018)  Tobacco Use: Low Risk  (10/31/2022)   SDOH Interventions:     Readmission Risk Interventions     No data to display

## 2022-10-31 NOTE — Progress Notes (Signed)
1 Day Post-Op Subjective: Patient complaining of lower abdominal soreness.  She denies nausea/vomiting or catheter discomfort.  JP and Foley output appropriate.  JP creatinine was 1.3  Objective: Vital signs in last 24 hours: Temp:  [98 F (36.7 C)-99.5 F (37.5 C)] 98 F (36.7 C) (04/19 1136) Pulse Rate:  [82-107] 93 (04/19 1136) Resp:  [14-31] 16 (04/19 1136) BP: (118-160)/(51-84) 121/65 (04/19 1136) SpO2:  [90 %-98 %] 90 % (04/19 1136)  Intake/Output from previous day: 04/18 0701 - 04/19 0700 In: 3525.9 [P.O.:180; I.V.:3245.9; IV Piggyback:100] Out: 770 [Urine:600; Drains:70; Blood:100]  Intake/Output this shift: Total I/O In: -  Out: 210 [Urine:100; Drains:110]  Physical Exam:  General: Alert and oriented CV: RRR, palpable distal pulses Lungs: CTAB, equal chest rise Abdomen: Soft, NTND, no rebound or guarding Incisions: clean, dry and intact.  JP drain with serosanguineous output  Gu: Foley catheter draining clear-yellow urine Ext: NT, No erythema  Lab Results: Recent Labs    10/30/22 0950 10/30/22 1734 10/31/22 0423  HGB 10.1* 9.9* 8.2*  HCT 33.1* 29.0* 26.9*   BMET Recent Labs    10/30/22 0950 10/30/22 1734 10/31/22 0423  NA 139 137 136  K 5.2* 5.6* 4.8  CL 107  --  107  CO2 21*  --  22  GLUCOSE 127*  --  148*  BUN 36*  --  31*  CREATININE 3.28*  --  2.05*  CALCIUM 7.8*  --  7.1*     Studies/Results: CT CYSTOGRAM PELVIS  Result Date: 10/30/2022 CLINICAL DATA:  Bladder lesion. EXAM: CT CYSTOGRAM (CT PELVIS WITH CONTRAST) TECHNIQUE: Multidetector CT imaging through the pelvis was performed after dilute contrast had been introduced into the bladder for the purposes of performing CT cystography. RADIATION DOSE REDUCTION: This exam was performed according to the departmental dose-optimization program which includes automated exposure control, adjustment of the mA and/or kV according to patient size and/or use of iterative reconstruction technique.  CONTRAST:  25mL OMNIPAQUE IOHEXOL 300 MG/ML  SOLN COMPARISON:  CT 10/30/2022 noncontrast FINDINGS: Urinary Tract: There is contrast seen in the urinary bladder with a Foley catheter. Bladder wall is thickened and trabeculated. There is an ill-defined filling defect noted in the bladder with a masslike contour but overall mixed density with some contrast centrally measuring overall 3.8 by 3.3 by 5.0 cm. Although this could be a mass, this very well could be a hematoma. There is some high density material in the bladder on the prior noncontrast CT scan. There is extraluminal contrast seen along the left side of the dome with a focal defect seen through the bladder wall along the dome of the left side best on series 6, image 56. The contrast extends into the adjacent fluid in the pelvis outside the bladder. Some of this is in the extraperitoneal space but other areas in the peritoneal space. This is consistent with a bladder wall injury with an intraperitoneal component. Please see sagittal image 60 of series 8 as well. Bowel:  Visualized bowel in the pelvis is nondilated. Vascular/Lymphatic: Grossly preserved iliac vasculature on this non IV contrast exam. No specific abnormal lymph node enlargement. Reproductive:  No mass or other significant abnormality Other: Once again there is fluid in the extraperitoneal space as well as the peritoneal space, left slightly greater than right. Anasarca. Musculoskeletal: Degenerative changes seen of the spine and pelvis. There is streak artifact related to the spinal fixation hardware. IMPRESSION: Focal defect along the bladder dome left of midline with contrast from cystogram extending  outside the bladder wall into the intraperitoneal spaces. The bladder itself is with wall thickening and trabeculation. There is some luminal filling defects which could represent hematoma. Recommend follow-up to exclude underlying lesion correlate with prior intervention. Free fluid in the pelvis  both intra peritoneal and extraperitoneal spaces. Critical Value/emergent results were called by telephone at the time of interpretation on 10/30/2022 at 12:11 pm to provider Lorre Nick , who verbally acknowledged these results. Electronically Signed   By: Karen Kays M.D.   On: 10/30/2022 15:17   CT ABDOMEN PELVIS WO CONTRAST  Result Date: 10/30/2022 CLINICAL DATA:  77 year old female with abdominal pain "on and off for a few days). Nausea and vomiting. 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 EXAM: CT ABDOMEN AND PELVIS WITHOUT CONTRAST TECHNIQUE: Multidetector CT imaging of the abdomen and pelvis was performed following the standard protocol without IV contrast. RADIATION DOSE REDUCTION: This exam was performed according to the departmental dose-optimization program which includes automated exposure control, adjustment of the mA and/or kV according to patient size and/or use of iterative reconstruction technique. COMPARISON:  Pre nephrectomy CT Abdomen and Pelvis 09/10/2020. FINDINGS: Lower chest: Small layering right pleural effusion is new since 2022. No cardiomegaly or pericardial effusion. Bilateral lung base streaky and platelike opacity most resembles atelectasis and/or scarring, increased bilaterally since 2022. Hepatobiliary: Evidence of gallbladder sludge but no pericholecystic inflammation. Trace perihepatic ascites. But the noncontrast liver is otherwise negative. Pancreas: Negative. Spleen: Small volume left upper quadrant ascites with simple fluid density. Diminutive and otherwise negative spleen. Adrenals/Urinary Tract: Left kidney is absent now. Left adrenal gland remains in place and appears normal. Normal right adrenal gland. Right kidney remains in place. No definite right hydronephrosis, and no right hydroureter. However, the urinary bladder is abnormal. The bladder is indistinct, with surrounding simple density fluid which seems to be mil Multi spatial. Within the bladder is of  vague mixed density 4.1 cm rounded mass on coronal image 73, axial image 75. Pelvic phleboliths, but no definite urinary calculus. Stomach/Bowel: Large bowel is decompressed from the splenic flexure distally. Right colon and transverse colon average volume retained stool. Decompressed terminal ileum. Appendix not identified. No dilated small bowel. Small gastric hiatal hernia or phrenic ampulla. Stomach is decompressed. Chronic distal duodenum diverticulum which does not appear significantly changed since 2022, no definite inflammation (series 2, image 35). But there is abnormal ascites, most present in the caudal omentum and mesentery, also partially layering in the gutters. No pneumoperitoneum identified. Vascular/Lymphatic: Normal caliber abdominal aorta. Aortoiliac calcified atherosclerosis. Vascular patency is not evaluated in the absence of IV contrast. No lymphadenopathy identified. Reproductive: Chronically absent uterus, diminutive or absent ovaries. Other: Motion artifact in the pelvis. No layering pelvis free fluid, but simple fluid density tracking in the lower mesentery and surrounding the urinary bladder (seems partially within the space of Retzius). Chronic pelvic phleboliths. Musculoskeletal: Widespread previous lumbar decompression and fusion with chronic L1-L2 adjacent segment disease. Significant spinal stenosis there, bulky disc osteophyte degeneration. No acute or suspicious osseous lesion identified. Dependent body wall edema, and also some upper abdominal ventral wall edema. No soft tissue gas. IMPRESSION: 1. Abnormal but nonspecific Free Fluid in the abdomen and pelvis. This has simple fluid density. The left upper quadrant, caudal omentum, and the soft tissue spaces surrounding the urinary bladder are most affected (see #2). No overt changes of cirrhosis or portal venous hypertension to explain ascites. 2. Abnormal bladder containing of mixed density nonspecific roughly 4 cm mass. The  hyperdense  component of this could be blood or soft tissue. Query hematuria, recommend correlation with urinalysis. Given #1 and #2, a bladder rupture is not excluded. 3. New small layering right pleural effusion, and some scattered bilateral body wall edema. But no cardiomegaly. Lung base atelectasis and scarring but no evidence of lung base edema. 4. No discrete bowel abnormality. No pneumoperitoneum. Chronic duodenum of the distal diverticulum. Electronically Signed   By: Odessa Fleming M.D.   On: 10/30/2022 10:59    Assessment/Plan: 77 year old female with intraperitoneal bladder perforation following TURBT on 10/07/2022, status post open cystorrhaphy on 10/30/2022  -Remove JP drain tomorrow if output appropriate.  JP creatinine normal today -Keep Foley catheter for 2 weeks -Out of bed to chair and ambulate -Advance diet as tolerated   LOS: 1 day   Rhoderick Moody, MD [facility] Pager: [phone]  10/31/2022, 3:21 PM

## 2022-10-31 NOTE — Evaluation (Signed)
Physical Therapy Evaluation Patient Details Name: [name] MRN: [id number] DOB: 1946/04/24 Today's Date: 10/31/2022  History of Present Illness  Pt is 77 yo female presented on 10/30/22 with abdominal pain.  Pt found to have bladder perforation and s/p exp lap with partial cystectomy and cystorrhaphy with JP drain on 10/30/22.  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  Clinical Impression  Pt admitted with above diagnosis. At baseline, pt from ALF and able to stand pivot to w/c on her own.  Today, pt limited by pain with activity (no pain at rest) and was only able to get partially to EOB with mod A.   Did note pt had not recently had pain meds and reported no pain in supine.  Pt currently with functional limitations due to the deficits listed below (see PT Problem List). Pt will benefit from acute skilled PT to increase their independence and safety with mobility to allow discharge.  Patient will benefit from continued inpatient follow up therapy, <3 hours/day after discharge.          Recommendations for follow up therapy are one component of a multi-disciplinary discharge planning process, led by the attending physician.  Recommendations may be updated based on patient status, additional functional criteria and insurance authorization.  Follow Up Recommendations Can patient physically be transported by private vehicle: No     Assistance Recommended at Discharge Frequent or constant Supervision/Assistance  Patient can return home with the following  Two people to help with walking and/or transfers;Two people to help with bathing/dressing/bathroom    Equipment Recommendations Wheelchair (measurements PT);Wheelchair cushion (measurements PT)  Recommendations for Other Services       Functional Status Assessment Patient has had a recent decline in their functional status and demonstrates the  ability to make significant improvements in function in a reasonable and predictable amount of time.     Precautions / Restrictions Precautions Precautions: Fall Precaution Comments: exp lap, JP drain      Mobility  Bed Mobility Overal bed mobility: Needs Assistance Bed Mobility: Supine to Sit     Supine to sit: Mod assist, HOB elevated     General bed mobility comments: Pt with good effort moving legs and lifting trunk with assist but then had increased pain and not able to continue scooting forward. Did note last pain meds early this morning.    Transfers                   General transfer comment: unable due to pain    Ambulation/Gait                  Stairs            Wheelchair Mobility    Modified Rankin (Stroke Patients Only)       Balance Overall balance assessment: Needs assistance Sitting-balance support: Bilateral upper extremity supported, Feet unsupported Sitting balance-Leahy Scale: Poor                                       Pertinent Vitals/Pain Pain Assessment Pain Assessment: 0-10 Pain Score: 9  Pain Location: Abdomen with movement Pain Descriptors / Indicators: Discomfort, Grimacing, Guarding Pain Intervention(s): Limited activity within patient's tolerance, Monitored during session, Patient requesting pain meds-RN notified    Home Living Family/patient expects to be discharged to:: Assisted  living ([facility])                 Home Equipment: Rolling Walker (2 wheels) Additional Comments: ALF provides manual W/c    Prior Function Prior Level of Function : Needs assist             Mobility Comments: Pt can stand pivot to w/c on her own and mobilizes on her own in w/c.  Reports worked with therapy about a month ago and walked some. ADLs Comments: Pt reports toielting on her own; tries dressing on her own but can call for help if needed; has assist with showers; facility does iadls      Hand Dominance        Extremity/Trunk Assessment   Upper Extremity Assessment Upper Extremity Assessment: Generalized weakness    Lower Extremity Assessment Lower Extremity Assessment: LLE deficits/detail;RLE deficits/detail RLE Deficits / Details: ROM WFL; MMT at least 3/5 but not further tested due to pain; noted edema, erythema, weeping, and wounds on lower legs (notified RN of wounds on legs) LLE Deficits / Details: ROM WFL; MMT at least 3/5 but not further tested due to pain; noted edema, erythema, weeping, and wounds on lower legs (notfied RN)    Cervical / Trunk Assessment Cervical / Trunk Assessment: Kyphotic;Other exceptions Cervical / Trunk Exceptions: weak trunk - abdominal sx  Communication   Communication: No difficulties  Cognition Arousal/Alertness: Awake/alert Behavior During Therapy: WFL for tasks assessed/performed Overall Cognitive Status: History of cognitive impairments - at baseline                                          General Comments General comments (skin integrity, edema, etc.): VSS    Exercises     Assessment/Plan    PT Assessment Patient needs continued PT services  PT Problem List Decreased strength;Pain;Decreased range of motion;Decreased activity tolerance;Decreased balance;Decreased mobility;Decreased knowledge of precautions;Decreased safety awareness;Decreased knowledge of use of DME;Decreased skin integrity       PT Treatment Interventions DME instruction;Therapeutic exercise;Gait training;Balance training;Functional mobility training;Therapeutic activities;Patient/family education;Modalities;Wheelchair mobility training    PT Goals (Current goals can be found in the Care Plan section)  Acute Rehab PT Goals Patient Stated Goal: decrease pain PT Goal Formulation: With patient Time For Goal Achievement: 11/14/22 Potential to Achieve Goals: Good Additional Goals Additional Goal #1: Pt will mobilize in w/c for  200' with supervision    Frequency Min 1X/week     Co-evaluation               AM-PAC PT "6 Clicks" Mobility  Outcome Measure Help needed turning from your back to your side while in a flat bed without using bedrails?: A Lot Help needed moving from lying on your back to sitting on the side of a flat bed without using bedrails?: A Lot Help needed moving to and from a bed to a chair (including a wheelchair)?: Total Help needed standing up from a chair using your arms (e.g., wheelchair or bedside chair)?: Total Help needed to walk in hospital room?: Total Help needed climbing 3-5 steps with a railing? : Total 6 Click Score: 8    End of Session   Activity Tolerance: Patient limited by pain Patient left: in bed;with call bell/phone within reach;with SCD's reapplied;with bed alarm set Nurse Communication: Mobility status;Other (comment);Patient requests pain meds (wounds on lower legs) PT Visit Diagnosis: Muscle  weakness (generalized) (M62.81);Other abnormalities of gait and mobility (R26.89)    Time: 4098-1191 PT Time Calculation (min) (ACUTE ONLY): 14 min   Charges:   PT Evaluation $PT Eval Low Complexity: 1 Low          Rusty Glodowski, PT Acute Rehab [facility] [phone]   Rayetta Humphrey 10/31/2022, 5:34 PM

## 2022-11-01 DIAGNOSIS — R109 Unspecified abdominal pain: Secondary | ICD-10-CM | POA: Diagnosis not present

## 2022-11-01 DIAGNOSIS — S3720XA Unspecified injury of bladder, initial encounter: Secondary | ICD-10-CM | POA: Diagnosis not present

## 2022-11-01 LAB — BASIC METABOLIC PANEL
Anion gap: 6 (ref 5–15)
BUN: 33 mg/dL — ABNORMAL HIGH (ref 8–23)
CO2: 23 mmol/L (ref 22–32)
Calcium: 7.2 mg/dL — ABNORMAL LOW (ref 8.9–10.3)
Chloride: 108 mmol/L (ref 98–111)
Creatinine, Ser: 1.47 mg/dL — ABNORMAL HIGH (ref 0.44–1.00)
GFR, Estimated: 37 mL/min — ABNORMAL LOW (ref >60)
Glucose, Bld: 90 mg/dL (ref 70–99)
Potassium: 4.7 mmol/L (ref 3.5–5.1)
Sodium: 137 mmol/L (ref 135–145)

## 2022-11-01 LAB — CBC
HCT: 25.8 % — ABNORMAL LOW (ref 36.0–46.0)
Hemoglobin: 7.8 g/dL — ABNORMAL LOW (ref 12.0–15.0)
MCH: 30 pg (ref 26.0–34.0)
MCHC: 30.2 g/dL (ref 30.0–36.0)
MCV: 99.2 fL (ref 80.0–100.0)
Platelets: 161 10*3/uL (ref 150–400)
RBC: 2.6 MIL/uL — ABNORMAL LOW (ref 3.87–5.11)
RDW: 15.7 % — ABNORMAL HIGH (ref 11.5–15.5)
WBC: 9 10*3/uL (ref 4.0–10.5)
nRBC: 0 % (ref 0.0–0.2)

## 2022-11-01 NOTE — Progress Notes (Signed)
Mobility Specialist - Progress Note   11/01/22 0915  Mobility  Activity Dangled on edge of bed  Level of Assistance Minimal assist, patient does 75% or more  Assistive Device Front wheel walker  Activity Response Tolerated well  Mobility Referral Yes  $Mobility charge 1 Mobility   Pt received in bed and agreeable to sit EOB w/ assistance of NT. Once sitting up pt c/o back pain but still eager to sit up. Assisted pt with brushing teeth while sitting up. No other complaints during session. Pt to bed after session with all needs met.     [facility]

## 2022-11-01 NOTE — Consult Note (Signed)
WOC Nurse Consult Note: Reason for Consult:bilateral LEs with full thickness wounds (chronic, nonhealing) and weeping dermatitis Wound type:venous insufficiency Pressure Injury POA: Yes/No/NA Measurement:To be obtained by Bedside RN and Wound Treatment Associate (WTA-C) P. Armstrong later today with first dressing application  Wound ZOX:WRUE, moist Drainage (amount, consistency, odor) serous, small  Periwound:hemosiderin staining Dressing procedure/placement/frequency: I have provided Nursing with guidance for the care of these lesions using a NS cleanse followed by application of an antimicrobial nonadherent (xeroform gauze) topped with dry gauze and an ABD pad. This is to be secured with Kerlix roll gauze applied from just below toes to just below knees. The Kerlix is to be topped with an ACE bandage applied in a similar manner. Feet are to be placed into Prevalon Boots and a sacral foam dressing placed for PI prevention.  WOC nursing team will not follow, but will remain available to this patient, the nursing and medical teams.  Please re-consult if needed.  Thank you for inviting [place] to participate in this patient's Plan of Care.  Ladona Mow, MSN, RN, CNS, GNP, Leda Min, [employer], [employer] phone:  [phone]

## 2022-11-01 NOTE — Progress Notes (Signed)
PROGRESS NOTE    CHASMINE LENDER  ONG:[id number] DOB: 04/05/1946 DOA: 10/30/2022 PCP: Clayborn Heron, MD   Brief Narrative: 77 y.o. 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   Patient reports at least 2 days of intermittent abdominal discomfort.  Pain can become severe at times, is generalized, associated with nausea and an episode of hematuria.  She is unable to identify alleviating or exacerbating factors.   ED Course: Upon arrival to the ED, patient is found to be afebrile and saturating mid 90s on room air initially with elevated heart rate and stable blood pressure.  Labs are most notable for creatinine 3.28, potassium 5.2, hemoglobin 10.1, normal WBC, and normal lactic acid.  CT of the abdomen pelvis demonstrates free fluid in the abdomen and abnormal urinary bladder appearance.  This was followed by CT cystogram which demonstrates focal defect in her bladder with contrast from cystogram extending outside of the bladder.   Urology was consulted by the ED physician, took the patient to the OR for exploratory laparotomy with partial cystectomy and cystorrhaphy.   Assessment & Plan:   Principal Problem:   Bladder injury Active Problems:   Rheumatoid arthritis (HCC)   Hypertension   MDD (major depressive disorder), recurrent episode, severe   Cognitive impairment   Anxiety   Abdominal pain   Abdominal fluid collection   H/O transurethral resection of bladder tumor (TURBT)   Acute renal failure superimposed on stage 3a chronic kidney disease   1. Bladder perforation - She underwent ex lap with partial cystectomy and cystorrhaphy 10/29/21  - Continue Foley and JP drain care, pain-control   2. AKI superimposed on CKD IIIa; hyperkalemia   - SCr is  2.05 from 3.28 on  admission, up from baseline of ~1.2; potassium is 4.8 from 5.6 after lokelma  - Continue IVF hydration   3. Rheumatoid arthritis  - Continue leflunomide, Plaquenil, and prednisone 5 mg qD    4. Hypertension  - Continue metoprolol     5. Depression, anxiety, cognitive impairment  - Continue Lexapro, Seroquel, and hydroxyzine, use delirium precautions     Estimated body mass index is 31.71 kg/m as calculated from the following:   Height as of this encounter:  (1.6 m).   Weight as of this encounter: 81.2 kg.  DVT prophylaxis: none due to hematuria and bladder perforation Code Status: Full code  family Communication: None at bedside Disposition Plan:  Status is: Inpatient Remains inpatient appropriate because: AKI hematuria bladder perforation   Consultants:  Urology  Procedures: exploratory laparotomy with partial cystectomy and cystorrhaphy.   Antimicrobials: none  Subjective:  Hemoglobin 7.8 from 9.9 JP drain with blood-tinged drainage noted BMP pending Creatinine 2.05 yesterday Objective: Vitals:   11/01/22 0126 11/01/22 0429 11/01/22 0433 11/01/22 1007  BP: 133/67  139/66 126/71  Pulse: 85  83 88  Resp: 20  20 (!) 22  Temp: 98.4 F (36.9 C)  98.4 F (36.9 C) 98.2 F (36.8 C)  TempSrc: Oral  Oral   SpO2: (!) 89%  91% 93%  Weight:  81.2 kg    Height:        Intake/Output Summary (Last 24 hours) at 11/01/2022 1048 Last data filed at 11/01/2022 0915 Gross per 24 hour  Intake 460 ml  Output 1350 ml  Net -890 ml    [employer]  10/30/22 0839 11/01/22 0429  Weight: 73.9 kg 81.2 kg    Examination:  General exam: Appears in no acute distress Respiratory system: Clear to auscultation. Respiratory effort normal. Cardiovascular system: S1 & S2 heard, RRR. No JVD, murmurs, rubs, gallops or clicks. No pedal edema. Gastrointestinal system: Abdomen is nondistended, soft and tender. No organomegaly or masses felt. Normal bowel sounds heard.  Drain in  place [place] nervous system: Alert and oriented. No focal neurological deficits. Extremities: 1+ edema.   Data Reviewed: I have personally reviewed following labs and imaging studies  CBC: Recent Labs  Lab 10/30/22 0950 10/30/22 1734 10/31/22 0423 11/01/22 0609  WBC 8.4  --  7.2 9.0  NEUTROABS 7.0  --   --   --   HGB 10.1* 9.9* 8.2* 7.8*  HCT 33.1* 29.0* 26.9* 25.8*  MCV 96.8  --  97.5 99.2  PLT 232  --  165 161    Basic Metabolic Panel: Recent Labs  Lab 10/30/22 0950 10/30/22 1734 10/31/22 0423  NA 139 137 136  K 5.2* 5.6* 4.8  CL 107  --  107  CO2 21*  --  22  GLUCOSE 127*  --  148*  BUN 36*  --  31*  CREATININE 3.28*  --  2.05*  CALCIUM 7.8*  --  7.1*  MG  --   --  1.9    GFR: Estimated Creatinine Clearance: 23.6 mL/min (A) (by C-G formula based on SCr of 2.05 mg/dL (H)). Liver Function Tests: Recent Labs  Lab 10/30/22 0950  AST 23  ALT 29  ALKPHOS 91  BILITOT 0.5  PROT 6.0*  ALBUMIN 3.0*    Recent Labs  Lab 10/30/22 0950  LIPASE 25    No results for input(s): "AMMONIA" in the last 168 hours. Coagulation Profile: No results for input(s): "INR", "PROTIME" in the last 168 hours. Cardiac Enzymes: No results for input(s): "CKTOTAL", "CKMB", "CKMBINDEX", "TROPONINI" in the last 168 hours. BNP (last 3 results) No results for input(s): "PROBNP" in the last 8760 hours. HbA1C: No results for input(s): "HGBA1C" in the last 72 hours. CBG: Recent Labs  Lab 10/30/22 1652 10/30/22 1945  GLUCAP 112* 137*    Lipid Profile: No results for input(s): "CHOL", "HDL", "LDLCALC", "TRIG", "CHOLHDL", "LDLDIRECT" in the last 72 hours. Thyroid Function Tests: No results for input(s): "TSH", "T4TOTAL", "FREET4", "T3FREE", "THYROIDAB" in the last 72 hours. Anemia Panel: No results for input(s): "VITAMINB12", "FOLATE", "FERRITIN", "TIBC", "IRON", "RETICCTPCT" in the last 72 hours. Sepsis Labs: Recent Labs  Lab 10/30/22 0950 10/30/22 1332  LATICACIDVEN 1.8  1.8     No results found for this or any previous visit (from the past 240 hour(s)).       Radiology Studies: CT CYSTOGRAM PELVIS  Result Date: 10/30/2022 CLINICAL DATA:  Bladder lesion. EXAM: CT CYSTOGRAM (CT PELVIS WITH CONTRAST) TECHNIQUE: Multidetector CT imaging through the pelvis was performed after dilute contrast had been introduced into the bladder for the purposes of performing CT cystography. RADIATION DOSE REDUCTION: This exam was performed according to the departmental dose-optimization program which includes automated exposure control, adjustment of the mA and/or kV according to patient size and/or use of iterative reconstruction technique. CONTRAST:  25mL OMNIPAQUE IOHEXOL 300 MG/ML  SOLN COMPARISON:  CT 10/30/2022 noncontrast FINDINGS: Urinary Tract: There is contrast seen in the urinary bladder with a Foley catheter. Bladder wall is thickened and trabeculated. There is an ill-defined filling defect noted in the bladder with a masslike contour but overall mixed density with some  contrast centrally measuring overall 3.8 by 3.3 by 5.0 cm. Although this could be a mass, this very well could be a hematoma. There is some high density material in the bladder on the prior noncontrast CT scan. There is extraluminal contrast seen along the left side of the dome with a focal defect seen through the bladder wall along the dome of the left side best on series 6, image 56. The contrast extends into the adjacent fluid in the pelvis outside the bladder. Some of this is in the extraperitoneal space but other areas in the peritoneal space. This is consistent with a bladder wall injury with an intraperitoneal component. Please see sagittal image 60 of series 8 as well. Bowel:  Visualized bowel in the pelvis is nondilated. Vascular/Lymphatic: Grossly preserved iliac vasculature on this non IV contrast exam. No specific abnormal lymph node enlargement. Reproductive:  No mass or other significant abnormality  Other: Once again there is fluid in the extraperitoneal space as well as the peritoneal space, left slightly greater than right. Anasarca. Musculoskeletal: Degenerative changes seen of the spine and pelvis. There is streak artifact related to the spinal fixation hardware. IMPRESSION: Focal defect along the bladder dome left of midline with contrast from cystogram extending outside the bladder wall into the intraperitoneal spaces. The bladder itself is with wall thickening and trabeculation. There is some luminal filling defects which could represent hematoma. Recommend follow-up to exclude underlying lesion correlate with prior intervention. Free fluid in the pelvis both intra peritoneal and extraperitoneal spaces. Critical Value/emergent results were called by telephone at the time of interpretation on 10/30/2022 at 12:11 pm to provider Lorre Nick , who verbally acknowledged these results. Electronically Signed   By: Karen Kays M.D.   On: 10/30/2022 15:17        Scheduled Meds:  benztropine  0.5 mg Oral BID   Chlorhexidine Gluconate Cloth  6 each Topical Daily   escitalopram  15 mg Oral Daily   hydroxychloroquine  400 mg Oral Daily   hydrOXYzine  50 mg Oral BID   leflunomide  20 mg Oral Daily   metoprolol succinate  25 mg Oral Daily   OLANZapine  5 mg Oral QHS   predniSONE  5 mg Oral Q breakfast   Continuous Infusions:   LOS: 2 days    Time spent: 38 min  Alwyn Ren, MD  11/01/2022, 10:48 AM

## 2022-11-01 NOTE — Progress Notes (Signed)
2 Days Post-Op Subjective: No acute events overnight.  Pain well-controlled with PRNs.  Tolerating diet without nausea or emesis.  Making adequate urine which is clear yellow.  200 cc from JP drain.  Hemoglobin 7.8.  Creatinine improved to 1.4.  Objective: Vital signs in last 24 hours: Temp:  [98.2 F (36.8 C)-98.5 F (36.9 C)] 98.2 F (36.8 C) (04/20 1007) Pulse Rate:  [83-94] 88 (04/20 1007) Resp:  [16-22] 22 (04/20 1007) BP: (113-141)/(61-71) 126/71 (04/20 1007) SpO2:  [89 %-94 %] 93 % (04/20 1007) Weight:  [81.2 kg] 81.2 kg (04/20 0429)  Intake/Output from previous day: 04/19 0701 - 04/20 0700 In: 240 [P.O.:240] Out: 1250 [Urine:1050; Drains:200]  Intake/Output this shift: Total I/O In: 220 [P.O.:220] Out: 280 [Urine:250; Drains:30]  Physical Exam:  General: Alert and oriented Lungs: Normal work of breathing on room air Abdomen: Soft, NTND, no rebound or guarding Incisions: clean, dry and intact.  Staples in place.  JP drain with serosanguineous output  Gu: Foley catheter draining clear-yellow urine Ext: NT, No erythema  Lab Results: Recent Labs    10/30/22 1734 10/31/22 0423 11/01/22 0609  HGB 9.9* 8.2* 7.8*  HCT 29.0* 26.9* 25.8*    BMET Recent Labs    10/31/22 0423 11/01/22 0609  NA 136 137  K 4.8 4.7  CL 107 108  CO2 22 23  GLUCOSE 148* 90  BUN 31* 33*  CREATININE 2.05* 1.47*  CALCIUM 7.1* 7.2*      Studies/Results: CT CYSTOGRAM PELVIS  Result Date: 10/30/2022 CLINICAL DATA:  Bladder lesion. EXAM: CT CYSTOGRAM (CT PELVIS WITH CONTRAST) TECHNIQUE: Multidetector CT imaging through the pelvis was performed after dilute contrast had been introduced into the bladder for the purposes of performing CT cystography. RADIATION DOSE REDUCTION: This exam was performed according to the departmental dose-optimization program which includes automated exposure control, adjustment of the mA and/or kV according to patient size and/or use of iterative  reconstruction technique. CONTRAST:  25mL OMNIPAQUE IOHEXOL 300 MG/ML  SOLN COMPARISON:  CT 10/30/2022 noncontrast FINDINGS: Urinary Tract: There is contrast seen in the urinary bladder with a Foley catheter. Bladder wall is thickened and trabeculated. There is an ill-defined filling defect noted in the bladder with a masslike contour but overall mixed density with some contrast centrally measuring overall 3.8 by 3.3 by 5.0 cm. Although this could be a mass, this very well could be a hematoma. There is some high density material in the bladder on the prior noncontrast CT scan. There is extraluminal contrast seen along the left side of the dome with a focal defect seen through the bladder wall along the dome of the left side best on series 6, image 56. The contrast extends into the adjacent fluid in the pelvis outside the bladder. Some of this is in the extraperitoneal space but other areas in the peritoneal space. This is consistent with a bladder wall injury with an intraperitoneal component. Please see sagittal image 60 of series 8 as well. Bowel:  Visualized bowel in the pelvis is nondilated. Vascular/Lymphatic: Grossly preserved iliac vasculature on this non IV contrast exam. No specific abnormal lymph node enlargement. Reproductive:  No mass or other significant abnormality Other: Once again there is fluid in the extraperitoneal space as well as the peritoneal space, left slightly greater than right. Anasarca. Musculoskeletal: Degenerative changes seen of the spine and pelvis. There is streak artifact related to the spinal fixation hardware. IMPRESSION: Focal defect along the bladder dome left of midline with contrast from cystogram extending  outside the bladder wall into the intraperitoneal spaces. The bladder itself is with wall thickening and trabeculation. There is some luminal filling defects which could represent hematoma. Recommend follow-up to exclude underlying lesion correlate with prior intervention.  Free fluid in the pelvis both intra peritoneal and extraperitoneal spaces. Critical Value/emergent results were called by telephone at the time of interpretation on 10/30/2022 at 12:11 pm to provider Lorre Nick , who verbally acknowledged these results. Electronically Signed   By: Karen Kays M.D.   On: 10/30/2022 15:17    Assessment/Plan: 77 year old female with intraperitoneal bladder perforation following TURBT on 10/07/2022, status post open cystorrhaphy on 10/30/2022  -Keep Foley catheter for 2 weeks -Continue JP drain to bulb suction at this time. -Out of bed to chair and ambulate -Advance diet as tolerated   LOS: 2 days   Neila Gear, MD [facility] PGY4 [facility]   11/01/2022, 12:06 PM

## 2022-11-02 DIAGNOSIS — R109 Unspecified abdominal pain: Secondary | ICD-10-CM | POA: Diagnosis not present

## 2022-11-02 LAB — CBC
HCT: 30.4 % — ABNORMAL LOW (ref 36.0–46.0)
Hemoglobin: 9.5 g/dL — ABNORMAL LOW (ref 12.0–15.0)
MCH: 31 pg (ref 26.0–34.0)
MCHC: 31.3 g/dL (ref 30.0–36.0)
MCV: 99.3 fL (ref 80.0–100.0)
Platelets: 204 10*3/uL (ref 150–400)
RBC: 3.06 MIL/uL — ABNORMAL LOW (ref 3.87–5.11)
RDW: 15.7 % — ABNORMAL HIGH (ref 11.5–15.5)
WBC: 10 10*3/uL (ref 4.0–10.5)
nRBC: 0 % (ref 0.0–0.2)

## 2022-11-02 LAB — COMPREHENSIVE METABOLIC PANEL
ALT: 27 U/L (ref 0–44)
AST: 43 U/L — ABNORMAL HIGH (ref 15–41)
Albumin: 2.4 g/dL — ABNORMAL LOW (ref 3.5–5.0)
Alkaline Phosphatase: 113 U/L (ref 38–126)
Anion gap: 10 (ref 5–15)
BUN: 26 mg/dL — ABNORMAL HIGH (ref 8–23)
CO2: 21 mmol/L — ABNORMAL LOW (ref 22–32)
Calcium: 7.5 mg/dL — ABNORMAL LOW (ref 8.9–10.3)
Chloride: 105 mmol/L (ref 98–111)
Creatinine, Ser: 1.3 mg/dL — ABNORMAL HIGH (ref 0.44–1.00)
GFR, Estimated: 43 mL/min — ABNORMAL LOW (ref >60)
Glucose, Bld: 82 mg/dL (ref 70–99)
Potassium: 4.2 mmol/L (ref 3.5–5.1)
Sodium: 136 mmol/L (ref 135–145)
Total Bilirubin: 0.5 mg/dL (ref 0.3–1.2)
Total Protein: 6.1 g/dL — ABNORMAL LOW (ref 6.5–8.1)

## 2022-11-02 MED ORDER — POLYETHYLENE GLYCOL 3350 17 G PO PACK
17.0000 g | PACK | Freq: Every day | ORAL | Status: DC
  Administered 2022-11-02: 17 g via ORAL
  Filled 2022-11-02 (×2): qty 1

## 2022-11-02 MED ORDER — BISACODYL 10 MG RE SUPP
10.0000 mg | Freq: Once | RECTAL | Status: AC
  Administered 2022-11-02: 10 mg via RECTAL
  Filled 2022-11-02: qty 1

## 2022-11-02 MED ORDER — BISACODYL 5 MG PO TBEC
10.0000 mg | DELAYED_RELEASE_TABLET | Freq: Every day | ORAL | Status: DC
  Filled 2022-11-02: qty 2

## 2022-11-02 NOTE — Progress Notes (Signed)
PROGRESS NOTE    [name]  WUJ:[id number] DOB: 10-19-1945 DOA: 10/30/2022 PCP: Clayborn Heron, MD   Brief Narrative: 77 y.o. 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   Patient reports at least 2 days of intermittent abdominal discomfort.  Pain can become severe at times, is generalized, associated with nausea and an episode of hematuria.  She is unable to identify alleviating or exacerbating factors.   ED Course: Upon arrival to the ED, patient is found to be afebrile and saturating mid 90s on room air initially with elevated heart rate and stable blood pressure.  Labs are most notable for creatinine 3.28, potassium 5.2, hemoglobin 10.1, normal WBC, and normal lactic acid.  CT of the abdomen pelvis demonstrates free fluid in the abdomen and abnormal urinary bladder appearance.  This was followed by CT cystogram which demonstrates focal defect in her bladder with contrast from cystogram extending outside of the bladder.   Urology was consulted by the ED physician, took the patient to the OR for exploratory laparotomy with partial cystectomy and cystorrhaphy.   Assessment & Plan:   Principal Problem:   Bladder injury Active Problems:   Rheumatoid arthritis (HCC)   Hypertension   MDD (major depressive disorder), recurrent episode, severe   Cognitive impairment   Anxiety   Abdominal pain   Abdominal fluid collection   H/O transurethral resection of bladder tumor (TURBT)   Acute renal failure superimposed on stage 3a chronic kidney disease   1. Bladder perforation - She underwent ex lap with partial cystectomy and cystorrhaphy 10/29/21  - Continue Foley and JP drain care, pain-control   2. AKI superimposed on CKD IIIa; hyperkalemia   - SCr is  2.05 from 3.28 on  admission, up from baseline of ~1.2; potassium is 4.8 from 5.6 after lokelma  - Continue IVF hydration   3. Rheumatoid arthritis  - Continue leflunomide, Plaquenil, and prednisone 5 mg qD    4. Hypertension  - Continue metoprolol     5. Depression, anxiety, cognitive impairment  - Continue Lexapro, Seroquel, and hydroxyzine, use delirium precautions   6 constipation-stool softners laxatives   Estimated body mass index is 31.71 kg/m as calculated from the following:   Height as of this encounter:  (1.6 m).   Weight as of this encounter: 81.2 kg.  DVT prophylaxis: none due to hematuria and bladder perforation Code Status: Full code  family Communication: None at bedside Disposition Plan:  Status is: Inpatient Remains inpatient appropriate because: AKI hematuria bladder perforation   Consultants:  Urology  Procedures: exploratory laparotomy with partial cystectomy and cystorrhaphy.   Antimicrobials: none  Subjective:  Hemoglobin 9.5 from 7.8 from 9.9 JP drain with blood-tinged drainage noted  Cr 1.3  Objective: Vitals:   11/01/22 2043 11/01/22 2045 11/02/22 0529 11/02/22 1446  BP: (!) 143/72  (!) 152/82 124/82  Pulse: 92  85 79  Resp: Temp: 100.3 F (37.9 C) 99.7 F (37.6 C) 98.2 F (36.8 C) 98.1 F (36.7 C)  TempSrc: Oral Oral Oral Oral  SpO2: 93%  99% 96%  Weight:      Height:        Intake/Output Summary (Last 24 hours) at 11/02/2022 1504 Last data filed at 11/02/2022 0934 Gross per 24 hour  Intake 300 ml  Output 1645 ml  Net -  1345 ml    [employer]   10/30/22 0839 11/01/22 0429  Weight: 73.9 kg 81.2 kg    Examination:  General exam: Appears in no acute distress Respiratory system: Clear to auscultation. Respiratory effort normal. Cardiovascular system: S1 & S2 heard, RRR. No JVD, murmurs, rubs, gallops or clicks. No pedal edema. Gastrointestinal system: Abdomen is nondistended, soft and tender. No organomegaly or masses felt.  Normal bowel sounds heard.  Drain in place [place] nervous system: Alert and oriented. No focal neurological deficits. Extremities: 1+ edema.   Data Reviewed: I have personally reviewed following labs and imaging studies  CBC: Recent Labs  Lab 10/30/22 0950 10/30/22 1734 10/31/22 0423 11/01/22 0609 11/02/22 0549  WBC 8.4  --  7.2 9.0 10.0  NEUTROABS 7.0  --   --   --   --   HGB 10.1* 9.9* 8.2* 7.8* 9.5*  HCT 33.1* 29.0* 26.9* 25.8* 30.4*  MCV 96.8  --  97.5 99.2 99.3  PLT 232  --  165 161 204    Basic Metabolic Panel: Recent Labs  Lab 10/30/22 0950 10/30/22 1734 10/31/22 0423 11/01/22 0609 11/02/22 0549  NA 139 137 136 137 136  K 5.2* 5.6* 4.8 4.7 4.2  CL 107  --  107 108 105  CO2 21*  --  22 23 21*  GLUCOSE 127*  --  148* 90 82  BUN 36*  --  31* 33* 26*  CREATININE 3.28*  --  2.05* 1.47* 1.30*  CALCIUM 7.8*  --  7.1* 7.2* 7.5*  MG  --   --  1.9  --   --     GFR: Estimated Creatinine Clearance: 37.1 mL/min (A) (by C-G formula based on SCr of 1.3 mg/dL (H)). Liver Function Tests: Recent Labs  Lab 10/30/22 0950 11/02/22 0549  AST 23 43*  ALT 29 27  ALKPHOS 91 113  BILITOT 0.5 0.5  PROT 6.0* 6.1*  ALBUMIN 3.0* 2.4*    Recent Labs  Lab 10/30/22 0950  LIPASE 25    No results for input(s): "AMMONIA" in the last 168 hours. Coagulation Profile: No results for input(s): "INR", "PROTIME" in the last 168 hours. Cardiac Enzymes: No results for input(s): "CKTOTAL", "CKMB", "CKMBINDEX", "TROPONINI" in the last 168 hours. BNP (last 3 results) No results for input(s): "PROBNP" in the last 8760 hours. HbA1C: No results for input(s): "HGBA1C" in the last 72 hours. CBG: Recent Labs  Lab 10/30/22 1652 10/30/22 1945  GLUCAP 112* 137*    Lipid Profile: No results for input(s): "CHOL", "HDL", "LDLCALC", "TRIG", "CHOLHDL", "LDLDIRECT" in the last 72 hours. Thyroid Function Tests: No results for input(s): "TSH", "T4TOTAL", "FREET4", "T3FREE", "THYROIDAB" in  the last 72 hours. Anemia Panel: No results for input(s): "VITAMINB12", "FOLATE", "FERRITIN", "TIBC", "IRON", "RETICCTPCT" in the last 72 hours. Sepsis Labs: Recent Labs  Lab 10/30/22 0950 10/30/22 1332  LATICACIDVEN 1.8 1.8     No results found for this or any previous visit (from the past 240 hour(s)).       Radiology Studies: No results found.      Scheduled Meds:  benztropine  0.5 mg Oral BID   bisacodyl  10 mg Oral Daily   Chlorhexidine Gluconate Cloth  6 each Topical Daily   escitalopram  15 mg Oral Daily   hydroxychloroquine  400 mg Oral Daily   hydrOXYzine  50 mg Oral BID   leflunomide  20 mg Oral Daily   metoprolol succinate  25 mg Oral Daily   OLANZapine  5 mg Oral QHS   polyethylene glycol  17 g Oral Daily   predniSONE  5 mg Oral Q breakfast   Continuous Infusions:   LOS: 3 days    Time spent: 38 min  Alwyn Ren, MD  11/02/2022, 3:04 PM

## 2022-11-02 NOTE — Progress Notes (Signed)
Physical Therapy Treatment Patient Details Name: [name] MRN: [id number] DOB: 02-26-1946 Today's Date: 11/02/2022   History of Present Illness Pt is 77 yo female presented on 10/30/22 with abdominal pain.  Pt found to have bladder perforation and s/p exp lap with partial cystectomy and cystorrhaphy with JP drain on 10/30/22.  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    PT Comments    Pt limited by anxiety regarding falling but with increased time, encouragement and introduction of STEDY pt sit<>stand x 3 and up to chair for lunch.  Pt pleased to be OOB.   Recommendations for follow up therapy are one component of a multi-disciplinary discharge planning process, led by the attending physician.  Recommendations may be updated based on patient status, additional functional criteria and insurance authorization.  Follow Up Recommendations  Can patient physically be transported by private vehicle: No    Assistance Recommended at Discharge Frequent or constant Supervision/Assistance  Patient can return home with the following Two people to help with walking and/or transfers;Two people to help with bathing/dressing/bathroom   Equipment Recommendations  Wheelchair (measurements PT);Wheelchair cushion (measurements PT)    Recommendations for Other Services       Precautions / Restrictions Precautions Precautions: Fall Precaution Comments: exp lap, JP drain Restrictions Weight Bearing Restrictions: No     Mobility  Bed Mobility Overal bed mobility: Needs Assistance Bed Mobility: Supine to Sit     Supine to sit: Mod assist, HOB elevated     General bed mobility comments: Increased time with HOB elevated.  Pt with good effort moving legs and lifting trunk with assist but requiring assist to complete scoot to EOB sitting    Transfers Overall transfer level: Needs assistance Equipment  used:  (STEDY) Transfers: Sit to/from Stand Sit to Stand: From elevated surface, Min assist, Mod assist, +2 physical assistance, +2 safety/equipment (performed x 3)           General transfer comment: Pt with elevated anxiety regarding standing and falling with RW - STEDY introduced    Ambulation/Gait               General Gait Details: Pt up with STEDY 2* elevated anxiety level   Stairs             Wheelchair Mobility    Modified Rankin (Stroke Patients Only)       Balance Overall balance assessment: Needs assistance Sitting-balance support: Bilateral upper extremity supported, Feet unsupported Sitting balance-Leahy Scale: Fair     Standing balance support: Bilateral upper extremity supported Standing balance-Leahy Scale: Poor                              Cognition Arousal/Alertness: Awake/alert Behavior During Therapy: WFL for tasks assessed/performed Overall Cognitive Status: History of cognitive impairments - at baseline                                 General Comments: Elevated anxiety level regarding falls        Exercises      General Comments        Pertinent Vitals/Pain Pain Assessment Pain Assessment: 0-10 Pain Score: 6  Pain Location: Abdomen with movement Pain Descriptors / Indicators: Aching, Grimacing, Guarding Pain Intervention(s): Limited activity within patient's tolerance, Monitored during session, Premedicated before session  Home Living                          Prior Function            PT Goals (current goals can now be found in the care plan section) Acute Rehab PT Goals Patient Stated Goal: Not fall PT Goal Formulation: With patient Time For Goal Achievement: 11/14/22 Potential to Achieve Goals: Good Progress towards PT goals: Progressing toward goals    Frequency    Min 1X/week      PT Plan Current plan remains appropriate    Co-evaluation               AM-PAC PT "6 Clicks" Mobility   Outcome Measure  Help needed turning from your back to your side while in a flat bed without using bedrails?: A Lot Help needed moving from lying on your back to sitting on the side of a flat bed without using bedrails?: A Lot Help needed moving to and from a bed to a chair (including a wheelchair)?: Total Help needed standing up from a chair using your arms (e.g., wheelchair or bedside chair)?: A Lot Help needed to walk in hospital room?: Total Help needed climbing 3-5 steps with a railing? : Total 6 Click Score: 9    End of Session Equipment Utilized During Treatment: Gait belt (STEDY) Activity Tolerance: Other (comment) (anxiety regarding falling) Patient left: in chair;with call bell/phone within reach;with chair alarm set Nurse Communication: Mobility status PT Visit Diagnosis: Muscle weakness (generalized) (M62.81);Other abnormalities of gait and mobility (R26.89)     Time: 1610-9604 PT Time Calculation (min) (ACUTE ONLY): 27 min  Charges:  $Therapeutic Activity: 23-37 mins                     Mauro Kaufmann PT Acute Rehabilitation Services Pager [phone] Office [phone]    [name] 11/02/2022, 4:18 PM

## 2022-11-02 NOTE — Progress Notes (Signed)
Physical Therapy Treatment Patient Details Name: [name] MRN: [id number] DOB: [birth year] Today's Date: 11/02/2022   History of Present Illness Pt is 77 yo female presented on 10/30/22 with abdominal pain.  Pt found to have bladder perforation and s/p exp lap with partial cystectomy and cystorrhaphy with JP drain on 10/30/22.  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    PT Comments    Pt continues with elevated anxiety regarding falling but with each attempt at standing and transferring with STEDY demonstrating increased confidence and decreasing level of assist required for sit<>stand.  Pt assisted recliner to [facility] to bed.   Recommendations for follow up therapy are one component of a multi-disciplinary discharge planning process, led by the attending physician.  Recommendations may be updated based on patient status, additional functional criteria and insurance authorization.  Follow Up Recommendations  Can patient physically be transported by private vehicle: No    Assistance Recommended at Discharge Frequent or constant Supervision/Assistance  Patient can return home with the following Two people to help with walking and/or transfers;Two people to help with bathing/dressing/bathroom   Equipment Recommendations  Wheelchair (measurements PT);Wheelchair cushion (measurements PT)    Recommendations for Other Services       Precautions / Restrictions Precautions Precautions: Fall Precaution Comments: exp lap, JP drain Restrictions Weight Bearing Restrictions: No     Mobility  Bed Mobility Overal bed mobility: Needs Assistance Bed Mobility: Sit to Supine     Supine to sit: Mod assist, HOB elevated Sit to supine: Mod assist, +2 for physical assistance, +2 for safety/equipment   General bed mobility comments: Increased time with assist to manage LEs and to control trunk     Transfers Overall transfer level: Needs assistance Equipment used:  (STEDY) Transfers: Sit to/from Stand Sit to Stand: From elevated surface, Min assist, +2 physical assistance, +2 safety/equipment           General transfer comment: Pt with elevated anxiety regarding standing and falling but improved with each attempt.  Pt assisted up from recliner to [facility] and up from [facility] to return to bed.    Ambulation/Gait               General Gait Details: Pt up with STEDY 2* elevated anxiety level   Stairs             Wheelchair Mobility    Modified Rankin (Stroke Patients Only)       Balance Overall balance assessment: Needs assistance Sitting-balance support: Bilateral upper extremity supported, Feet unsupported Sitting balance-Leahy Scale: Fair     Standing balance support: Bilateral upper extremity supported Standing balance-Leahy Scale: Poor                              Cognition Arousal/Alertness: Awake/alert Behavior During Therapy: WFL for tasks assessed/performed Overall Cognitive Status: History of cognitive impairments - at baseline                                 General Comments: Elevated anxiety level regarding falls        Exercises      General Comments        Pertinent Vitals/Pain Pain Assessment Pain Assessment: 0-10 Pain Score: 6  Pain Location: Abdomen with movement Pain Descriptors / Indicators: Aching, Grimacing, Guarding Pain Intervention(s):  Limited activity within patient's tolerance, Monitored during session    Home Living                          Prior Function            PT Goals (current goals can now be found in the care plan section) Acute Rehab PT Goals Patient Stated Goal: Not fall PT Goal Formulation: With patient Time For Goal Achievement: 11/14/22 Potential to Achieve Goals: Good Progress towards PT goals: Progressing toward goals    Frequency    Min  1X/week      PT Plan Current plan remains appropriate    Co-evaluation              AM-PAC PT "6 Clicks" Mobility   Outcome Measure  Help needed turning from your back to your side while in a flat bed without using bedrails?: A Lot Help needed moving from lying on your back to sitting on the side of a flat bed without using bedrails?: A Lot Help needed moving to and from a bed to a chair (including a wheelchair)?: Total Help needed standing up from a chair using your arms (e.g., wheelchair or bedside chair)?: A Lot Help needed to walk in hospital room?: Total Help needed climbing 3-5 steps with a railing? : Total 6 Click Score: 9    End of Session Equipment Utilized During Treatment: Gait belt Activity Tolerance: Other (comment) (anxiety level) Patient left: in bed;with call bell/phone within reach;with bed alarm set;with nursing/sitter in room Nurse Communication: Mobility status PT Visit Diagnosis: Muscle weakness (generalized) (M62.81);Other abnormalities of gait and mobility (R26.89)     Time: 1610-9604 PT Time Calculation (min) (ACUTE ONLY): 58 min  Charges:  $Therapeutic Activity: 23-37 mins                     Mauro Kaufmann PT Acute Rehabilitation Services Pager [phone] Office [phone]    [name] 11/02/2022, 4:25 PM

## 2022-11-02 NOTE — Progress Notes (Signed)
3 Days Post-Op Subjective: No acute events overnight.  Pain well-controlled with PRNs.  Tolerating diet without nausea or emesis.  Making adequate urine which is clear yellow.  105cc from JP drain. Hgb 9.5, Cr 1.3  Objective: Vital signs in last 24 hours: Temp:  [98.2 F (36.8 C)-100.3 F (37.9 C)] 98.2 F (36.8 C) (04/21 0529) Pulse Rate:  [85-99] 85 (04/21 0529) Resp:  [18-20] 18 (04/21 0529) BP: (126-152)/(49-82) 152/82 (04/21 0529) SpO2:  [90 %-99 %] 99 % (04/21 0529)  Intake/Output from previous day: 04/20 0701 - 04/21 0700 In: 640 [P.O.:640] Out: 2005 [Urine:1900; Drains:105]  Intake/Output this shift: Total I/O In: -  Out: 400 [Urine:400]  Physical Exam:  General: Alert and oriented Lungs: Normal work of breathing on room air Abdomen: Soft, NTND, no rebound or guarding Incisions: clean, dry and intact.  Staples in place.  JP drain with serous output Gu: Foley catheter draining clear yellow urine Ext: NT, No erythema  Lab Results: Recent Labs    10/31/22 0423 11/01/22 0609 11/02/22 0549  HGB 8.2* 7.8* 9.5*  HCT 26.9* 25.8* 30.4*    BMET Recent Labs    11/01/22 0609 11/02/22 0549  NA 137 136  K 4.7 4.2  CL 108 105  CO2 23 21*  GLUCOSE 90 82  BUN 33* 26*  CREATININE 1.47* 1.30*  CALCIUM 7.2* 7.5*      Studies/Results: No results found.  Assessment/Plan: 77 year old female with intraperitoneal bladder perforation following TURBT on 10/07/2022, status post open cystorrhaphy on 10/30/2022  -Keep Foley catheter for 2 weeks -Plan for JP removal today -Out of bed to chair and ambulate -Advance diet as tolerated   LOS: 3 days   Neila Gear, MD [facility] PGY4 [facility]   11/02/2022, 10:50 AM

## 2022-11-03 DIAGNOSIS — R109 Unspecified abdominal pain: Secondary | ICD-10-CM | POA: Diagnosis not present

## 2022-11-03 LAB — POCT I-STAT EG7
Acid-base deficit: 5 mmol/L — ABNORMAL HIGH (ref 0.0–2.0)
Bicarbonate: 19.1 mmol/L — ABNORMAL LOW (ref 20.0–28.0)
Calcium, Ion: 1.02 mmol/L — ABNORMAL LOW (ref 1.15–1.40)
HCT: 25 % — ABNORMAL LOW (ref 36.0–46.0)
Hemoglobin: 8.5 g/dL — ABNORMAL LOW (ref 12.0–15.0)
O2 Saturation: 87 %
Potassium: 5.1 mmol/L (ref 3.5–5.1)
Sodium: 136 mmol/L (ref 135–145)
TCO2: 20 mmol/L — ABNORMAL LOW (ref 22–32)
pCO2, Ven: 31.7 mmHg — ABNORMAL LOW (ref 44–60)
pH, Ven: 7.388 (ref 7.25–7.43)
pO2, Ven: 53 mmHg — ABNORMAL HIGH (ref 32–45)

## 2022-11-03 LAB — CBC
HCT: 25 % — ABNORMAL LOW (ref 36.0–46.0)
Hemoglobin: 7.7 g/dL — ABNORMAL LOW (ref 12.0–15.0)
MCH: 29.8 pg (ref 26.0–34.0)
MCHC: 30.8 g/dL (ref 30.0–36.0)
MCV: 96.9 fL (ref 80.0–100.0)
Platelets: 180 10*3/uL (ref 150–400)
RBC: 2.58 MIL/uL — ABNORMAL LOW (ref 3.87–5.11)
RDW: 15.2 % (ref 11.5–15.5)
WBC: 7.9 10*3/uL (ref 4.0–10.5)
nRBC: 0 % (ref 0.0–0.2)

## 2022-11-03 LAB — COMPREHENSIVE METABOLIC PANEL
ALT: 34 U/L (ref 0–44)
AST: 48 U/L — ABNORMAL HIGH (ref 15–41)
Albumin: 2.2 g/dL — ABNORMAL LOW (ref 3.5–5.0)
Alkaline Phosphatase: 115 U/L (ref 38–126)
Anion gap: 8 (ref 5–15)
BUN: 25 mg/dL — ABNORMAL HIGH (ref 8–23)
CO2: 23 mmol/L (ref 22–32)
Calcium: 7.2 mg/dL — ABNORMAL LOW (ref 8.9–10.3)
Chloride: 108 mmol/L (ref 98–111)
Creatinine, Ser: 1.26 mg/dL — ABNORMAL HIGH (ref 0.44–1.00)
GFR, Estimated: 44 mL/min — ABNORMAL LOW (ref >60)
Glucose, Bld: 88 mg/dL (ref 70–99)
Potassium: 4.1 mmol/L (ref 3.5–5.1)
Sodium: 139 mmol/L (ref 135–145)
Total Bilirubin: 0.5 mg/dL (ref 0.3–1.2)
Total Protein: 5.2 g/dL — ABNORMAL LOW (ref 6.5–8.1)

## 2022-11-03 LAB — SURGICAL PATHOLOGY

## 2022-11-03 MED ORDER — FUROSEMIDE 40 MG PO TABS: 20.0000 mg | ORAL_TABLET | Freq: Every day | ORAL | 1 refills | Status: DC

## 2022-11-03 MED ORDER — SODIUM CHLORIDE 0.9% IV SOLUTION: Freq: Once | INTRAVENOUS | Status: DC

## 2022-11-03 NOTE — [place] FL2 (Signed)
[place] MEDICAID FL2 LEVEL OF CARE FORM     IDENTIFICATION  Patient Name: [name] Birthdate: 31-Oct-1945 Sex: female Admission Date (Current Location): 10/30/2022  [facility] and [place] Number:  Producer, television/film/video and Address:  [facility],  [address] [place], [place] [address]      Provider Number: [id number]  Attending Physician Name and Address:  Alwyn Ren, MD  Relative Name and Phone Number:  Creed,Roger Brother [phone]  [phone]  Anthonette Legato Sister [phone]  [phone]  Apple,Candy Niece   [phone]    Current Level of Care: Hospital Recommended Level of Care: Assisted Living Facility Prior Approval Number:    Date Approved/Denied:   PASRR Number:    Discharge Plan: Other (Comment) (ALF)    Current Diagnoses: Patient Active Problem List   Diagnosis Date Noted   Abdominal pain 10/30/2022   Abdominal fluid collection 10/30/2022   H/O transurethral resection of bladder tumor (TURBT) 10/30/2022   Bladder injury 10/30/2022   Acute renal failure superimposed on stage 3a chronic kidney disease 10/30/2022   Cervical myelopathy 06/14/2021   Anxiety    Stage 3a chronic kidney disease (CKD)    Cervical stenosis of spinal canal    Hyperlipidemia associated with type 2 diabetes mellitus 03/14/2021   Acute respiratory failure with hypoxia 01/01/2021   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   Cognitive impairment 04/17/2019   Degeneration of lumbar intervertebral disc 10/15/2018   MDD (major depressive disorder), recurrent episode, severe 08/10/2018   MDD (major depressive disorder), recurrent, severe, with psychosis    Altered mental status 05/24/2018   Ketonuria 05/24/2018   Pain in right hand 09/11/2017   Hypertension 03/07/2014   Hyperlipidemia 03/07/2014   Depression 03/07/2014   Spinal stenosis of lumbar region with neurogenic claudication 02/24/2014   Type 2 diabetes mellitus without complication 02/24/2014   Rheumatoid  arthritis (HCC) 02/24/2014   Scoliosis of lumbar spine 02/13/2014    Orientation RESPIRATION BLADDER Height & Weight     Self, Time, Situation, Place  Normal Indwelling catheter Weight: 81.2 kg Height:   (160 cm)  BEHAVIORAL SYMPTOMS/MOOD NEUROLOGICAL BOWEL NUTRITION STATUS      Continent Diet (Soft)  AMBULATORY STATUS COMMUNICATION OF NEEDS Skin   Extensive Assist Verbally Other (Comment) (leg wounds on admission to hospital, Right leg)                       Personal Care Assistance Level of Assistance  Bathing, Feeding, Dressing Bathing Assistance: Limited assistance Feeding assistance: Independent Dressing Assistance: Limited assistance     Functional Limitations Info  Sight, Hearing, Speech Sight Info: Adequate Hearing Info: Adequate Speech Info: Adequate    SPECIAL CARE FACTORS FREQUENCY  PT (By licensed PT), OT (By licensed OT)     PT Frequency: Evaluate and treat OT Frequency: Evaluate and treat            Contractures Contractures Info: Not present    Additional Factors Info  Code Status, Allergies Code Status Info: FULL Allergies Info: Contrast Media (Iodinated Contrast Media), Abilify (Aripiprazole), Ultram (Tramadol Hcl), Zithromax (Azithromycin)           Current Medications (11/03/2022):  This is the current hospital active medication list Current Facility-Administered Medications  Medication Dose Route Frequency Provider Last Rate Last Admin   0.9 %  sodium chloride infusion (Manually program via Guardrails IV Fluids)   Intravenous Once Alwyn Ren, MD       acetaminophen (TYLENOL) tablet 650 mg  650 mg Oral Q6H PRN Opyd, Lavone Neri, MD       Or   acetaminophen (TYLENOL) suppository 650 mg  650 mg Rectal Q6H PRN Opyd, Lavone Neri, MD       benztropine (COGENTIN) tablet 0.5 mg  0.5 mg Oral BID Opyd, Lavone Neri, MD   0.5 mg at 11/03/22 0908   bisacodyl (DULCOLAX) EC tablet 10 mg  10 mg Oral Daily Alwyn Ren, MD        Chlorhexidine Gluconate Cloth 2 % PADS 6 each  6 each Topical Daily Alwyn Ren, MD   6 each at 11/03/22 0910   escitalopram (LEXAPRO) tablet 15 mg  15 mg Oral Daily Opyd, Lavone Neri, MD   15 mg at 11/03/22 8469   HYDROmorphone (DILAUDID) injection 0.5 mg  0.5 mg Intravenous Q2H PRN Karoline Caldwell B, MD   0.5 mg at 11/01/22 2129   hydroxychloroquine (PLAQUENIL) tablet 400 mg  400 mg Oral Daily Opyd, Lavone Neri, MD   400 mg at 11/03/22 0908   hydrOXYzine (ATARAX) tablet 50 mg  50 mg Oral BID Briscoe Deutscher, MD   50 mg at 11/03/22 0908   leflunomide (ARAVA) tablet 20 mg  20 mg Oral Daily Opyd, Lavone Neri, MD   20 mg at 11/03/22 0909   metoprolol succinate (TOPROL-XL) 24 hr tablet 25 mg  25 mg Oral Daily Opyd, Lavone Neri, MD   25 mg at 11/03/22 0908   OLANZapine (ZYPREXA) tablet 5 mg  5 mg Oral QHS Opyd, Lavone Neri, MD   5 mg at 11/02/22 2140   oxyCODONE-acetaminophen (PERCOCET/ROXICET) 5-325 MG per tablet 1 tablet  1 tablet Oral Q4H PRN Belva Agee, MD   1 tablet at 11/03/22 1042   polyethylene glycol (MIRALAX / GLYCOLAX) packet 17 g  17 g Oral Daily Alwyn Ren, MD   17 g at 11/02/22 1148   predniSONE (DELTASONE) tablet 5 mg  5 mg Oral Q breakfast Opyd, Lavone Neri, MD   5 mg at 11/03/22 0740     Discharge Medications: Please see discharge summary for a list of discharge medications.  Relevant Imaging Results:  Relevant Lab Results:   Additional Information SS#[id number]  Geni Bers, RN

## 2022-11-03 NOTE — Progress Notes (Signed)
Attempted to call Victorino Dike at [facility] [phone] but she was not available. Secretary to give her the message to call back for report.

## 2022-11-03 NOTE — TOC Progression Note (Signed)
Transition of Care [facility]) - Progression Note    Patient Details  Name: [name] MRN: [id number] Date of Birth: [birth year]  [facility]) CM/SW Contact  Geni Bers, RN Phone Number: 11/03/2022, 10:55 AM  Clinical Narrative:    Pt is from [facility]. A call to RN Victorino Dike was made. Pt may return will need FL2 and HHRN/PT orders. Transport by [employer].    Expected Discharge Plan: Assisted Living Barriers to Discharge: Continued Medical Work up  Expected Discharge Plan and Services In-house Referral: NA Discharge Planning Services: NA Post Acute Care Choice: NA Living arrangements for the past 2 months: Assisted Living Facility Expected Discharge Date: 11/03/22               DME Arranged: N/A DME Agency: NA       HH Arranged: NA HH Agency: NA         Social Determinants of Health (SDOH) Interventions SDOH Screenings   Alcohol Screen: Low Risk  (08/10/2018)  Tobacco Use: Low Risk  (10/31/2022)    Readmission Risk Interventions     No data to display

## 2022-11-03 NOTE — Progress Notes (Signed)
4 Days Post-Op Subjective: NAEON.  Foley in place with clear yellow urine.  Patient complaining of localized moderate postoperative abdominal pain.  Objective: Vital signs in last 24 hours: Temp:  [98.1 F (36.7 C)-98.7 F (37.1 C)] 98.5 F (36.9 C) (04/22 0520) Pulse Rate:  [79-92] 92 (04/22 0520) Resp:  [16-18] 18 (04/22 0520) BP: (124-141)/(61-82) 139/61 (04/22 0520) SpO2:  [95 %-96 %] 95 % (04/22 0520)  Intake/Output from previous day: 04/21 0701 - 04/22 0700 In: 180 [P.O.:180] Out: 2660 [Urine:2650; Drains:10]  Intake/Output this shift: Total I/O In: 240 [P.O.:240] Out: 850 [Urine:850]  Physical Exam:  General: Alert and oriented Lungs: Normal work of breathing on room air Abdomen: Soft, NTND, no rebound or guarding Incisions: clean, dry and intact.  Staples in place Gu: Foley catheter draining clear yellow urine Ext: NT, No erythema  Lab Results: Recent Labs    11/01/22 0609 11/02/22 0549 11/03/22 0444  HGB 7.8* 9.5* 7.7*  HCT 25.8* 30.4* 25.0*   BMET Recent Labs    11/02/22 0549 11/03/22 0444  NA 136 139  K 4.2 4.1  CL 105 108  CO2 21* 23  GLUCOSE 82 88  BUN 26* 25*  CREATININE 1.30* 1.26*  CALCIUM 7.5* 7.2*     Studies/Results: No results found.  Assessment/Plan: 77 year old female with intraperitoneal bladder perforation following TURBT on 10/07/2022, status post open cystorrhaphy on 10/30/2022  -Keep Foley catheter for 2 weeks -J/P out with no drainage -Out of bed to chair and ambulate -Advance diet as tolerated -Follow up in clinic- 10-14 days for staple removal and possible voiding trial.    LOS: 4 days   Elmon Kirschner, NP [facility]   11/03/2022, 11:54 AM

## 2022-11-03 NOTE — Progress Notes (Addendum)
Patient refused blood transfusion. MD made aware.

## 2022-11-03 NOTE — TOC Progression Note (Signed)
Transition of Care [facility]) - Progression Note    Patient Details  Name: [name] MRN: [id number] Date of Birth: 1946/06/28  [facility]) CM/SW Contact  Geni Bers, RN Phone Number: 11/03/2022, 12:35 PM  Clinical Narrative:     Home Health referral given to [facility], [name] for HHRN/PT.    Expected Discharge Plan: Assisted Living Barriers to Discharge: Continued Medical Work up  Expected Discharge Plan and Services In-house Referral: NA Discharge Planning Services: NA Post Acute Care Choice: NA Living arrangements for the past 2 months: Assisted Living Facility Expected Discharge Date: 11/03/22               DME Arranged: N/A DME Agency: NA       HH Arranged: NA HH Agency: NA         Social Determinants of Health (SDOH) Interventions SDOH Screenings   Alcohol Screen: Low Risk  (08/10/2018)  Tobacco Use: Low Risk  (10/31/2022)    Readmission Risk Interventions     No data to display

## 2022-11-03 NOTE — Discharge Summary (Signed)
Physician Discharge Summary  [name] ZOX:[id number] DOB: [birth year] DOA: 10/30/2022  PCP: Clayborn Heron, MD  Admit date: 10/30/2022 Discharge date: 11/03/2022  Admitted From: NH Disposition: NH Recommendations for Outpatient Follow-up:  Follow up with PCP in 1-2 weeks Please obtain BMP/CBC in one week Please follow up WITH UROLOGY  Home Health:NO Equipment/Devices:NONE Discharge Condition STABLE CODE STATUS:FULL Diet recommendation: CARDIAC Brief/Interim Summary:  77 y.o. 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   Patient reports at least 2 days of intermittent abdominal discomfort.  Pain can become severe at times, is generalized, associated with nausea and an episode of hematuria.  She is unable to identify alleviating or exacerbating factors.   ED Course: Upon arrival to the ED, patient is found to be afebrile and saturating mid 90s on room air initially with elevated heart rate and stable blood pressure.  Labs are most notable for creatinine 3.28, potassium 5.2, hemoglobin 10.1, normal WBC, and normal lactic acid.  CT of the abdomen pelvis demonstrates free fluid in the abdomen and abnormal urinary bladder appearance.  This was followed by CT cystogram which demonstrates focal defect in her bladder with contrast from cystogram extending outside of the bladder.   Urology was consulted by the ED physician, took the patient to the OR for exploratory laparotomy with partial cystectomy and cystorrhaphy.  Discharge Diagnoses:  Principal Problem:   Bladder injury Active Problems:   Rheumatoid arthritis (HCC)   Hypertension   MDD (major depressive disorder), recurrent episode, severe   Cognitive impairment   Anxiety   Abdominal pain   Abdominal fluid  collection   H/O transurethral resection of bladder tumor (TURBT)   Acute renal failure superimposed on stage 3a chronic kidney disease  1. Bladder perforation - She underwent ex lap with partial cystectomy and cystorrhaphy 10/29/21.  She had a JP drain and a Foley catheter in place after surgery.  JP drain was removed 11/02/2022.  She will be discharged with the Foley in place.  Urology wanted the Foley in place for approximately at least 4 weeks prior to removal.  She needs to follow-up with urologist to see if they can remove the Foley catheter.  She will need a repeat cystogram prior to removing the Foley.  Do not DC Foley at the nursing home.   2. AKI superimposed on CKD IIIa; hyperkalemia creatinine was 1.26 on the day of discharge from 3.28 on admission.    3. Rheumatoid arthritis  - Continue leflunomide, Plaquenil, and prednisone 5 mg qD    4. Hypertension  - Continue metoprolol     5. Depression, anxiety, cognitive impairment  - Continue Lexapro, Seroquel, and hydroxyzine, use delirium precautions   6 constipation-stool softners laxatives   7 anemia hemoglobin dropped to 7.7 from 10 on admission patient refused blood transfusion.  Some of this is possible as related to post surgery acute blood loss anemia.  She has anemia of chronic disease.    Estimated body mass index is 31.71 kg/m as calculated from the following:   Height as of this encounter: 5\' 3"  (1.6 m).   Weight as of this encounter: 81.2 kg.  Discharge Instructions  Discharge Instructions     Diet - low sodium heart healthy   Complete by: As directed    Discharge wound care:   Complete by: As directed  Wound care to bilateral lower extremity full-thickness wounds cleanse with normal saline pat dry cover with folded layers of Xeroform gauze top with dry gauze ABD pad for comfort and secure with Kerlix roll gauze applied from just below the toes to just below the knees and with paper tape.  Top Kerlix roll gauze  with Ace bandage applied in a similar manner place feet into Prevalon boots.   Increase activity slowly   Complete by: As directed       Allergies as of 11/03/2022       Reactions   Contrast Media [iodinated Contrast Media] Anaphylaxis, Hives, Other (See Comments)   Only the "retina dye"   Abilify [aripiprazole] Other (See Comments)   Pt does not recall reaction   Ultram [tramadol Hcl] Other (See Comments)   Reaction unknown   Zithromax [azithromycin] Anxiety, Other (See Comments)   Heightens anxiety        Medication List     STOP taking these medications    cefdinir 300 MG capsule Commonly known as: OMNICEF       TAKE these medications    Accu-Chek Guide test strip Generic drug: glucose blood Check blood sugar every morning   acetaminophen 500 MG tablet Commonly known as: TYLENOL Take 1,000 mg by mouth every 6 (six) hours as needed for moderate pain or fever.   aspirin 81 MG chewable tablet Chew 81 mg by mouth daily.   benztropine 0.5 MG tablet Commonly known as: COGENTIN Take 0.5 mg by mouth 2 (two) times daily.   Dextromethorphan-guaiFENesin 5-100 MG/5ML Liqd Take 20 mLs by mouth every 4 (four) hours as needed (cough/congestion).   Enulose 10 GM/15ML Soln Generic drug: lactulose (encephalopathy) Take 10 g by mouth daily.   escitalopram 10 MG tablet Commonly known as: LEXAPRO Take 15 mg by mouth daily.   ferrous sulfate 325 (65 FE) MG EC tablet Take 325 mg by mouth every Monday, Wednesday, and Friday.   furosemide 40 MG tablet Commonly known as: LASIX Take 0.5 tablets (20 mg total) by mouth daily. What changed: how much to take   HYDROcodone-acetaminophen 5-325 MG tablet Commonly known as: NORCO/VICODIN Take 1 tablet by mouth every 4 (four) hours as needed for moderate pain.   hydroxychloroquine 200 MG tablet Commonly known as: PLAQUENIL Take 600 mg by mouth daily.   hydrOXYzine 50 MG capsule Commonly known as: VISTARIL Take 50 mg by  mouth 2 (two) times daily.   ipratropium 0.03 % nasal spray Commonly known as: ATROVENT Place 2 sprays into both nostrils 2 (two) times daily as needed for rhinitis.   leflunomide 20 MG tablet Commonly known as: ARAVA Take 20 mg by mouth daily.   loperamide 2 MG tablet Commonly known as: IMODIUM A-D Take 2 mg by mouth as needed for diarrhea or loose stools.   loratadine 10 MG tablet Commonly known as: CLARITIN Take 1 tablet (10 mg total) by mouth daily. What changed:  when to take this reasons to take this   melatonin 5 MG Tabs Take 5 mg by mouth at bedtime.   metoprolol succinate 25 MG 24 hr tablet Commonly known as: TOPROL-XL Take 25 mg by mouth daily.   nystatin cream Commonly known as: MYCOSTATIN Apply 1 Application topically 2 (two) times daily as needed (redness under abdominal folds and/or under breast).   OLANZapine 5 MG tablet Commonly known as: ZYPREXA Take 5 mg by mouth daily.   polyethylene glycol 17 g packet Commonly known as: MIRALAX / GLYCOLAX Take  17 g by mouth 2 (two) times daily.   predniSONE 5 MG tablet Commonly known as: DELTASONE Take 5 mg by mouth daily with breakfast.   Sore Throat 15-3.6 MG Lozg Generic drug: Benzocaine-Menthol Use as directed 1 lozenge in the mouth or throat every 2 (two) hours as needed (sore throat).   trimethoprim-polymyxin b ophthalmic solution Commonly known as: Polytrim Place 1 drop into both eyes every 4 (four) hours. While awake   vitamin C 250 MG tablet Commonly known as: ASCORBIC ACID Take 250 mg by mouth every Monday, Wednesday, and Friday.   Vitamin D 50 MCG (2000 UT) Caps Take 2,000 Units by mouth daily.               Discharge Care Instructions  (From admission, onward)           Start     Ordered   11/03/22 0000  Discharge wound care:       Comments: Wound care to bilateral lower extremity full-thickness wounds cleanse with normal saline pat dry cover with folded layers of Xeroform  gauze top with dry gauze ABD pad for comfort and secure with Kerlix roll gauze applied from just below the toes to just below the knees and with paper tape.  Top Kerlix roll gauze with Ace bandage applied in a similar manner place feet into Prevalon boots.   11/03/22 1610            Follow-up Information     Rene Paci, MD Follow up in 2 week(s).   Specialty: Urology Why: Call to schedule appointment for catheter removal Contact information: [address] [place] [address] [phone]                Allergies  Allergen Reactions   Contrast Media [Iodinated Contrast Media] Anaphylaxis, Hives and Other (See Comments)    Only the "retina dye"   Abilify [Aripiprazole] Other (See Comments)    Pt does not recall reaction   Ultram [Tramadol Hcl] Other (See Comments)    Reaction unknown   Zithromax [Azithromycin] Anxiety and Other (See Comments)    Heightens anxiety    Consultations:UROLOGY Procedures/Studies: CT CYSTOGRAM PELVIS  Result Date: 10/30/2022 CLINICAL DATA:  Bladder lesion. EXAM: CT CYSTOGRAM (CT PELVIS WITH CONTRAST) TECHNIQUE: Multidetector CT imaging through the pelvis was performed after dilute contrast had been introduced into the bladder for the purposes of performing CT cystography. RADIATION DOSE REDUCTION: This exam was performed according to the departmental dose-optimization program which includes automated exposure control, adjustment of the mA and/or kV according to patient size and/or use of iterative reconstruction technique. CONTRAST:  25mL OMNIPAQUE IOHEXOL 300 MG/ML  SOLN COMPARISON:  CT 10/30/2022 noncontrast FINDINGS: Urinary Tract: There is contrast seen in the urinary bladder with a Foley catheter. Bladder wall is thickened and trabeculated. There is an ill-defined filling defect noted in the bladder with a masslike contour but overall mixed density with some contrast centrally measuring overall 3.8 by 3.3 by 5.0 cm.  Although this could be a mass, this very well could be a hematoma. There is some high density material in the bladder on the prior noncontrast CT scan. There is extraluminal contrast seen along the left side of the dome with a focal defect seen through the bladder wall along the dome of the left side best on series 6, image 56. The contrast extends into the adjacent fluid in the pelvis outside the bladder. Some of this is in the extraperitoneal  space but other areas in the peritoneal space. This is consistent with a bladder wall injury with an intraperitoneal component. Please see sagittal image 60 of series 8 as well. Bowel:  Visualized bowel in the pelvis is nondilated. Vascular/Lymphatic: Grossly preserved iliac vasculature on this non IV contrast exam. No specific abnormal lymph node enlargement. Reproductive:  No mass or other significant abnormality Other: Once again there is fluid in the extraperitoneal space as well as the peritoneal space, left slightly greater than right. Anasarca. Musculoskeletal: Degenerative changes seen of the spine and pelvis. There is streak artifact related to the spinal fixation hardware. IMPRESSION: Focal defect along the bladder dome left of midline with contrast from cystogram extending outside the bladder wall into the intraperitoneal spaces. The bladder itself is with wall thickening and trabeculation. There is some luminal filling defects which could represent hematoma. Recommend follow-up to exclude underlying lesion correlate with prior intervention. Free fluid in the pelvis both intra peritoneal and extraperitoneal spaces. Critical Value/emergent results were called by telephone at the time of interpretation on 10/30/2022 at 12:11 pm to provider Lorre Nick , who verbally acknowledged these results. Electronically Signed   By: Karen Kays M.D.   On: 10/30/2022 15:17   CT ABDOMEN PELVIS WO CONTRAST  Result Date: 10/30/2022 CLINICAL DATA:  77 year old female with  abdominal pain "on and off for a few days). Nausea and vomiting. 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 EXAM: CT ABDOMEN AND PELVIS WITHOUT CONTRAST TECHNIQUE: Multidetector CT imaging of the abdomen and pelvis was performed following the standard protocol without IV contrast. RADIATION DOSE REDUCTION: This exam was performed according to the departmental dose-optimization program which includes automated exposure control, adjustment of the mA and/or kV according to patient size and/or use of iterative reconstruction technique. COMPARISON:  Pre nephrectomy CT Abdomen and Pelvis 09/10/2020. FINDINGS: Lower chest: Small layering right pleural effusion is new since 2022. No cardiomegaly or pericardial effusion. Bilateral lung base streaky and platelike opacity most resembles atelectasis and/or scarring, increased bilaterally since 2022. Hepatobiliary: Evidence of gallbladder sludge but no pericholecystic inflammation. Trace perihepatic ascites. But the noncontrast liver is otherwise negative. Pancreas: Negative. Spleen: Small volume left upper quadrant ascites with simple fluid density. Diminutive and otherwise negative spleen. Adrenals/Urinary Tract: Left kidney is absent now. Left adrenal gland remains in place and appears normal. Normal right adrenal gland. Right kidney remains in place. No definite right hydronephrosis, and no right hydroureter. However, the urinary bladder is abnormal. The bladder is indistinct, with surrounding simple density fluid which seems to be mil Multi spatial. Within the bladder is of vague mixed density 4.1 cm rounded mass on coronal image 73, axial image 75. Pelvic phleboliths, but no definite urinary calculus. Stomach/Bowel: Large bowel is decompressed from the splenic flexure distally. Right colon and transverse colon average volume retained stool. Decompressed terminal ileum. Appendix not identified. No dilated small bowel. Small gastric hiatal hernia or phrenic ampulla.  Stomach is decompressed. Chronic distal duodenum diverticulum which does not appear significantly changed since 2022, no definite inflammation (series 2, image 35). But there is abnormal ascites, most present in the caudal omentum and mesentery, also partially layering in the gutters. No pneumoperitoneum identified. Vascular/Lymphatic: Normal caliber abdominal aorta. Aortoiliac calcified atherosclerosis. Vascular patency is not evaluated in the absence of IV contrast. No lymphadenopathy identified. Reproductive: Chronically absent uterus, diminutive or absent ovaries. Other: Motion artifact in the pelvis. No layering pelvis free fluid, but simple fluid density tracking in the lower mesentery and surrounding the  urinary bladder (seems partially within the space of Retzius). Chronic pelvic phleboliths. Musculoskeletal: Widespread previous lumbar decompression and fusion with chronic L1-L2 adjacent segment disease. Significant spinal stenosis there, bulky disc osteophyte degeneration. No acute or suspicious osseous lesion identified. Dependent body wall edema, and also some upper abdominal ventral wall edema. No soft tissue gas. IMPRESSION: 1. Abnormal but nonspecific Free Fluid in the abdomen and pelvis. This has simple fluid density. The left upper quadrant, caudal omentum, and the soft tissue spaces surrounding the urinary bladder are most affected (see #2). No overt changes of cirrhosis or portal venous hypertension to explain ascites. 2. Abnormal bladder containing of mixed density nonspecific roughly 4 cm mass. The hyperdense component of this could be blood or soft tissue. Query hematuria, recommend correlation with urinalysis. Given #1 and #2, a bladder rupture is not excluded. 3. New small layering right pleural effusion, and some scattered bilateral body wall edema. But no cardiomegaly. Lung base atelectasis and scarring but no evidence of lung base edema. 4. No discrete bowel abnormality. No  pneumoperitoneum. Chronic duodenum of the distal diverticulum. Electronically Signed   By: Odessa Fleming M.D.   On: 10/30/2022 10:59   (Echo, Carotid, EGD, Colonoscopy, ERCP)    Subjective: NO new c/o  Foley in place Discharge Exam: Vitals:   11/02/22 2144 11/03/22 0520  BP: (!) 141/72 139/61  Pulse: 92 92  Resp: 18 18  Temp: 98.7 F (37.1 C) 98.5 F (36.9 C)  SpO2: 96% 95%   Vitals:   11/02/22 0529 11/02/22 1446 11/02/22 2144 11/03/22 0520  BP: (!) 152/82 124/82 (!) 141/72 139/61  Pulse: 85 79 92 92  Resp: 18 16 18 18   Temp: 98.2 F (36.8 C) 98.1 F (36.7 C) 98.7 F (37.1 C) 98.5 F (36.9 C)  TempSrc: Oral Oral Oral Oral  SpO2: 99% 96% 96% 95%  Weight:      Height:        General: Pt is alert, awake, not in acute distress Cardiovascular: RRR, S1/S2 +, no rubs, no gallops Respiratory: CTA bilaterally, no wheezing, no rhonchi Abdominal: Soft, NT, ND, bowel sounds + Extremities: no edema, no cyanosis    The results of significant diagnostics from this hospitalization (including imaging, microbiology, ancillary and laboratory) are listed below for reference.     Microbiology: No results found for this or any previous visit (from the past 240 hour(s)).   Labs: BNP (last 3 results) No results for input(s): "BNP" in the last 8760 hours. Basic Metabolic Panel: Recent Labs  Lab 10/30/22 0950 10/30/22 1734 10/30/22 1836 10/31/22 0423 11/01/22 0609 11/02/22 0549 11/03/22 0444  NA 139   < > 136 136 137 136 139  K 5.2*   < > 5.1 4.8 4.7 4.2 4.1  CL 107  --   --  107 108 105 108  CO2 21*  --   --  22 23 21* 23  GLUCOSE 127*  --   --  148* 90 82 88  BUN 36*  --   --  31* 33* 26* 25*  CREATININE 3.28*  --   --  2.05* 1.47* 1.30* 1.26*  CALCIUM 7.8*  --   --  7.1* 7.2* 7.5* 7.2*  MG  --   --   --  1.9  --   --   --    < > = values in this interval not displayed.   Liver Function Tests: Recent Labs  Lab 10/30/22 0950 11/02/22 0549 11/03/22 0444  AST 23  43* 48*   ALT 29 27 34  ALKPHOS 91 113 115  BILITOT 0.5 0.5 0.5  PROT 6.0* 6.1* 5.2*  ALBUMIN 3.0* 2.4* 2.2*   Recent Labs  Lab 10/30/22 0950  LIPASE 25   No results for input(s): "AMMONIA" in the last 168 hours. CBC: Recent Labs  Lab 10/30/22 0950 10/30/22 1734 10/30/22 1836 10/31/22 0423 11/01/22 0609 11/02/22 0549 11/03/22 0444  WBC 8.4  --   --  7.2 9.0 10.0 7.9  NEUTROABS 7.0  --   --   --   --   --   --   HGB 10.1*   < > 8.5* 8.2* 7.8* 9.5* 7.7*  HCT 33.1*   < > 25.0* 26.9* 25.8* 30.4* 25.0*  MCV 96.8  --   --  97.5 99.2 99.3 96.9  PLT 232  --   --  165 161 204 180   < > = values in this interval not displayed.   Cardiac Enzymes: No results for input(s): "CKTOTAL", "CKMB", "CKMBINDEX", "TROPONINI" in the last 168 hours. BNP: Invalid input(s): "POCBNP" CBG: Recent Labs  Lab 10/30/22 1652 10/30/22 1945  GLUCAP 112* 137*   D-Dimer No results for input(s): "DDIMER" in the last 72 hours. Hgb A1c No results for input(s): "HGBA1C" in the last 72 hours. Lipid Profile No results for input(s): "CHOL", "HDL", "LDLCALC", "TRIG", "CHOLHDL", "LDLDIRECT" in the last 72 hours. Thyroid function studies No results for input(s): "TSH", "T4TOTAL", "T3FREE", "THYROIDAB" in the last 72 hours.  Invalid input(s): "FREET3" Anemia work up No results for input(s): "VITAMINB12", "FOLATE", "FERRITIN", "TIBC", "IRON", "RETICCTPCT" in the last 72 hours. Urinalysis    Component Value Date/Time   COLORURINE YELLOW 10/30/2022 1400   APPEARANCEUR CLOUDY (A) 10/30/2022 1400   LABSPEC 1.017 10/30/2022 1400   PHURINE 5.0 10/30/2022 1400   GLUCOSEU NEGATIVE 10/30/2022 1400   HGBUR LARGE (A) 10/30/2022 1400   BILIRUBINUR NEGATIVE 10/30/2022 1400   KETONESUR NEGATIVE 10/30/2022 1400   PROTEINUR 100 (A) 10/30/2022 1400   UROBILINOGEN 0.2 02/01/2011 1327   NITRITE NEGATIVE 10/30/2022 1400   LEUKOCYTESUR MODERATE (A) 10/30/2022 1400   Sepsis Labs Recent Labs  Lab 10/31/22 0423  11/01/22 0609 11/02/22 0549 11/03/22 0444  WBC 7.2 9.0 10.0 7.9   Microbiology No results found for this or any previous visit (from the past 240 hour(s)).  Time coordinating discharge: 39 minutes SIGNED: Alwyn Ren, MD  11/03/2022, 9:46 AM

## 2022-11-04 DIAGNOSIS — N183 Chronic kidney disease, stage 3 unspecified: Secondary | ICD-10-CM | POA: Diagnosis not present

## 2022-11-04 DIAGNOSIS — M069 Rheumatoid arthritis, unspecified: Secondary | ICD-10-CM | POA: Diagnosis not present

## 2022-11-04 DIAGNOSIS — D631 Anemia in chronic kidney disease: Secondary | ICD-10-CM | POA: Diagnosis not present

## 2022-11-04 DIAGNOSIS — E1122 Type 2 diabetes mellitus with diabetic chronic kidney disease: Secondary | ICD-10-CM | POA: Diagnosis not present

## 2022-11-04 DIAGNOSIS — E559 Vitamin D deficiency, unspecified: Secondary | ICD-10-CM | POA: Diagnosis not present

## 2022-11-04 DIAGNOSIS — G3184 Mild cognitive impairment, so stated: Secondary | ICD-10-CM | POA: Diagnosis not present

## 2022-11-04 DIAGNOSIS — G4700 Insomnia, unspecified: Secondary | ICD-10-CM | POA: Diagnosis not present

## 2022-11-04 DIAGNOSIS — E1151 Type 2 diabetes mellitus with diabetic peripheral angiopathy without gangrene: Secondary | ICD-10-CM | POA: Diagnosis not present

## 2022-11-04 DIAGNOSIS — S81801D Unspecified open wound, right lower leg, subsequent encounter: Secondary | ICD-10-CM | POA: Diagnosis not present

## 2022-11-04 DIAGNOSIS — G894 Chronic pain syndrome: Secondary | ICD-10-CM | POA: Diagnosis not present

## 2022-11-04 DIAGNOSIS — Z8551 Personal history of malignant neoplasm of bladder: Secondary | ICD-10-CM | POA: Diagnosis not present

## 2022-11-04 DIAGNOSIS — Z7952 Long term (current) use of systemic steroids: Secondary | ICD-10-CM | POA: Diagnosis not present

## 2022-11-04 DIAGNOSIS — S80822D Blister (nonthermal), left lower leg, subsequent encounter: Secondary | ICD-10-CM | POA: Diagnosis not present

## 2022-11-04 DIAGNOSIS — E785 Hyperlipidemia, unspecified: Secondary | ICD-10-CM | POA: Diagnosis not present

## 2022-11-04 DIAGNOSIS — I129 Hypertensive chronic kidney disease with stage 1 through stage 4 chronic kidney disease, or unspecified chronic kidney disease: Secondary | ICD-10-CM | POA: Diagnosis not present

## 2022-11-04 DIAGNOSIS — J309 Allergic rhinitis, unspecified: Secondary | ICD-10-CM | POA: Diagnosis not present

## 2022-11-04 DIAGNOSIS — Z993 Dependence on wheelchair: Secondary | ICD-10-CM | POA: Diagnosis not present

## 2022-11-04 DIAGNOSIS — Z7982 Long term (current) use of aspirin: Secondary | ICD-10-CM | POA: Diagnosis not present

## 2022-11-04 DIAGNOSIS — K5900 Constipation, unspecified: Secondary | ICD-10-CM | POA: Diagnosis not present

## 2022-11-04 DIAGNOSIS — M4800 Spinal stenosis, site unspecified: Secondary | ICD-10-CM | POA: Diagnosis not present

## 2022-11-05 DIAGNOSIS — I129 Hypertensive chronic kidney disease with stage 1 through stage 4 chronic kidney disease, or unspecified chronic kidney disease: Secondary | ICD-10-CM | POA: Diagnosis not present

## 2022-11-05 DIAGNOSIS — N1832 Chronic kidney disease, stage 3b: Secondary | ICD-10-CM | POA: Diagnosis not present

## 2022-11-05 DIAGNOSIS — R600 Localized edema: Secondary | ICD-10-CM | POA: Diagnosis not present

## 2022-11-05 DIAGNOSIS — S3729XA Other injury of bladder, initial encounter: Secondary | ICD-10-CM | POA: Diagnosis not present

## 2022-11-07 DIAGNOSIS — Z7952 Long term (current) use of systemic steroids: Secondary | ICD-10-CM | POA: Diagnosis not present

## 2022-11-07 DIAGNOSIS — N183 Chronic kidney disease, stage 3 unspecified: Secondary | ICD-10-CM | POA: Diagnosis not present

## 2022-11-07 DIAGNOSIS — E785 Hyperlipidemia, unspecified: Secondary | ICD-10-CM | POA: Diagnosis not present

## 2022-11-07 DIAGNOSIS — K5900 Constipation, unspecified: Secondary | ICD-10-CM | POA: Diagnosis not present

## 2022-11-07 DIAGNOSIS — E559 Vitamin D deficiency, unspecified: Secondary | ICD-10-CM | POA: Diagnosis not present

## 2022-11-07 DIAGNOSIS — J309 Allergic rhinitis, unspecified: Secondary | ICD-10-CM | POA: Diagnosis not present

## 2022-11-07 DIAGNOSIS — M4800 Spinal stenosis, site unspecified: Secondary | ICD-10-CM | POA: Diagnosis not present

## 2022-11-07 DIAGNOSIS — M069 Rheumatoid arthritis, unspecified: Secondary | ICD-10-CM | POA: Diagnosis not present

## 2022-11-07 DIAGNOSIS — S80822D Blister (nonthermal), left lower leg, subsequent encounter: Secondary | ICD-10-CM | POA: Diagnosis not present

## 2022-11-07 DIAGNOSIS — G4700 Insomnia, unspecified: Secondary | ICD-10-CM | POA: Diagnosis not present

## 2022-11-07 DIAGNOSIS — E1122 Type 2 diabetes mellitus with diabetic chronic kidney disease: Secondary | ICD-10-CM | POA: Diagnosis not present

## 2022-11-07 DIAGNOSIS — G3184 Mild cognitive impairment, so stated: Secondary | ICD-10-CM | POA: Diagnosis not present

## 2022-11-07 DIAGNOSIS — S81801D Unspecified open wound, right lower leg, subsequent encounter: Secondary | ICD-10-CM | POA: Diagnosis not present

## 2022-11-07 DIAGNOSIS — Z8551 Personal history of malignant neoplasm of bladder: Secondary | ICD-10-CM | POA: Diagnosis not present

## 2022-11-07 DIAGNOSIS — Z993 Dependence on wheelchair: Secondary | ICD-10-CM | POA: Diagnosis not present

## 2022-11-07 DIAGNOSIS — D631 Anemia in chronic kidney disease: Secondary | ICD-10-CM | POA: Diagnosis not present

## 2022-11-07 DIAGNOSIS — I129 Hypertensive chronic kidney disease with stage 1 through stage 4 chronic kidney disease, or unspecified chronic kidney disease: Secondary | ICD-10-CM | POA: Diagnosis not present

## 2022-11-07 DIAGNOSIS — G894 Chronic pain syndrome: Secondary | ICD-10-CM | POA: Diagnosis not present

## 2022-11-07 DIAGNOSIS — E1151 Type 2 diabetes mellitus with diabetic peripheral angiopathy without gangrene: Secondary | ICD-10-CM | POA: Diagnosis not present

## 2022-11-07 DIAGNOSIS — Z7982 Long term (current) use of aspirin: Secondary | ICD-10-CM | POA: Diagnosis not present

## 2022-11-07 NOTE — Anesthesia Postprocedure Evaluation (Signed)
Anesthesia Post Note  Patient: [name]  Procedure(s) Performed: BLADDER REPAIR     Patient location during evaluation: PACU Anesthesia Type: General Level of consciousness: awake and alert Pain management: pain level controlled Vital Signs Assessment: post-procedure vital signs reviewed and stable Respiratory status: spontaneous breathing, nonlabored ventilation, respiratory function stable and patient connected to nasal cannula oxygen Cardiovascular status: blood pressure returned to baseline and stable Postop Assessment: no apparent nausea or vomiting Anesthetic complications: no   No notable events documented.  Last Vitals:  Vitals:   11/02/22 2144 11/03/22 0520  BP: (!) 141/72 139/61  Pulse: 92 92  Resp: 18 18  Temp: 37.1 C 36.9 C  SpO2: 96% 95%    Last Pain:  Vitals:   11/03/22 1111  TempSrc:   PainSc: 3                  [name]

## 2022-11-10 ENCOUNTER — Emergency Department (HOSPITAL_BASED_OUTPATIENT_CLINIC_OR_DEPARTMENT_OTHER)
Admit: 2022-11-10 | Discharge: 2022-11-10 | Disposition: A | Discharge status: Home / Self Care | Payer: Medicare Other | Attending: Emergency Medicine | Admitting: Emergency Medicine

## 2022-11-10 ENCOUNTER — Emergency Department (HOSPITAL_COMMUNITY)
Admission: EM | Admit: 2022-11-10 | Discharge: 2022-11-10 | Disposition: A | Discharge status: Home / Self Care | Payer: Medicare Other | Attending: Emergency Medicine | Admitting: Emergency Medicine

## 2022-11-10 ENCOUNTER — Other Ambulatory Visit: Payer: Self-pay

## 2022-11-10 ENCOUNTER — Emergency Department (HOSPITAL_COMMUNITY): Admit: 2022-11-10 | Payer: Medicare Other

## 2022-11-10 ENCOUNTER — Encounter (HOSPITAL_COMMUNITY): Payer: Self-pay

## 2022-11-10 ENCOUNTER — Emergency Department (HOSPITAL_COMMUNITY): Payer: Medicare Other

## 2022-11-10 DIAGNOSIS — R600 Localized edema: Secondary | ICD-10-CM | POA: Insufficient documentation

## 2022-11-10 DIAGNOSIS — I129 Hypertensive chronic kidney disease with stage 1 through stage 4 chronic kidney disease, or unspecified chronic kidney disease: Secondary | ICD-10-CM | POA: Diagnosis not present

## 2022-11-10 DIAGNOSIS — S80822D Blister (nonthermal), left lower leg, subsequent encounter: Secondary | ICD-10-CM | POA: Diagnosis not present

## 2022-11-10 DIAGNOSIS — I872 Venous insufficiency (chronic) (peripheral): Secondary | ICD-10-CM | POA: Insufficient documentation

## 2022-11-10 DIAGNOSIS — X58XXXA Exposure to other specified factors, initial encounter: Secondary | ICD-10-CM | POA: Diagnosis not present

## 2022-11-10 DIAGNOSIS — R6889 Other general symptoms and signs: Secondary | ICD-10-CM | POA: Diagnosis not present

## 2022-11-10 DIAGNOSIS — E119 Type 2 diabetes mellitus without complications: Secondary | ICD-10-CM | POA: Diagnosis not present

## 2022-11-10 DIAGNOSIS — M069 Rheumatoid arthritis, unspecified: Secondary | ICD-10-CM | POA: Diagnosis not present

## 2022-11-10 DIAGNOSIS — E1122 Type 2 diabetes mellitus with diabetic chronic kidney disease: Secondary | ICD-10-CM | POA: Diagnosis not present

## 2022-11-10 DIAGNOSIS — Z743 Need for continuous supervision: Secondary | ICD-10-CM | POA: Diagnosis not present

## 2022-11-10 DIAGNOSIS — M79605 Pain in left leg: Secondary | ICD-10-CM | POA: Diagnosis present

## 2022-11-10 DIAGNOSIS — S81801A Unspecified open wound, right lower leg, initial encounter: Secondary | ICD-10-CM | POA: Insufficient documentation

## 2022-11-10 DIAGNOSIS — E559 Vitamin D deficiency, unspecified: Secondary | ICD-10-CM | POA: Diagnosis not present

## 2022-11-10 DIAGNOSIS — Z79899 Other long term (current) drug therapy: Secondary | ICD-10-CM | POA: Diagnosis not present

## 2022-11-10 DIAGNOSIS — I10 Essential (primary) hypertension: Secondary | ICD-10-CM | POA: Diagnosis not present

## 2022-11-10 DIAGNOSIS — Z7982 Long term (current) use of aspirin: Secondary | ICD-10-CM | POA: Diagnosis not present

## 2022-11-10 DIAGNOSIS — N183 Chronic kidney disease, stage 3 unspecified: Secondary | ICD-10-CM | POA: Diagnosis not present

## 2022-11-10 DIAGNOSIS — R609 Edema, unspecified: Secondary | ICD-10-CM

## 2022-11-10 DIAGNOSIS — Z993 Dependence on wheelchair: Secondary | ICD-10-CM | POA: Diagnosis not present

## 2022-11-10 DIAGNOSIS — R0602 Shortness of breath: Secondary | ICD-10-CM | POA: Diagnosis not present

## 2022-11-10 DIAGNOSIS — Z8551 Personal history of malignant neoplasm of bladder: Secondary | ICD-10-CM | POA: Diagnosis not present

## 2022-11-10 DIAGNOSIS — S81802A Unspecified open wound, left lower leg, initial encounter: Secondary | ICD-10-CM

## 2022-11-10 DIAGNOSIS — G3184 Mild cognitive impairment, so stated: Secondary | ICD-10-CM | POA: Diagnosis not present

## 2022-11-10 DIAGNOSIS — Z7952 Long term (current) use of systemic steroids: Secondary | ICD-10-CM | POA: Diagnosis not present

## 2022-11-10 DIAGNOSIS — N3289 Other specified disorders of bladder: Secondary | ICD-10-CM | POA: Insufficient documentation

## 2022-11-10 DIAGNOSIS — K5900 Constipation, unspecified: Secondary | ICD-10-CM | POA: Diagnosis not present

## 2022-11-10 DIAGNOSIS — E1151 Type 2 diabetes mellitus with diabetic peripheral angiopathy without gangrene: Secondary | ICD-10-CM | POA: Diagnosis not present

## 2022-11-10 DIAGNOSIS — I700 Atherosclerosis of aorta: Secondary | ICD-10-CM | POA: Diagnosis not present

## 2022-11-10 DIAGNOSIS — C679 Malignant neoplasm of bladder, unspecified: Secondary | ICD-10-CM | POA: Diagnosis not present

## 2022-11-10 DIAGNOSIS — R531 Weakness: Secondary | ICD-10-CM | POA: Diagnosis not present

## 2022-11-10 DIAGNOSIS — M4800 Spinal stenosis, site unspecified: Secondary | ICD-10-CM | POA: Diagnosis not present

## 2022-11-10 DIAGNOSIS — E049 Nontoxic goiter, unspecified: Secondary | ICD-10-CM | POA: Diagnosis not present

## 2022-11-10 DIAGNOSIS — G4700 Insomnia, unspecified: Secondary | ICD-10-CM | POA: Diagnosis not present

## 2022-11-10 DIAGNOSIS — S81801D Unspecified open wound, right lower leg, subsequent encounter: Secondary | ICD-10-CM | POA: Diagnosis not present

## 2022-11-10 DIAGNOSIS — S3729XS Other injury of bladder, sequela: Secondary | ICD-10-CM | POA: Diagnosis not present

## 2022-11-10 DIAGNOSIS — Z85528 Personal history of other malignant neoplasm of kidney: Secondary | ICD-10-CM | POA: Diagnosis not present

## 2022-11-10 DIAGNOSIS — J9811 Atelectasis: Secondary | ICD-10-CM | POA: Diagnosis not present

## 2022-11-10 DIAGNOSIS — J309 Allergic rhinitis, unspecified: Secondary | ICD-10-CM | POA: Diagnosis not present

## 2022-11-10 DIAGNOSIS — J9809 Other diseases of bronchus, not elsewhere classified: Secondary | ICD-10-CM | POA: Diagnosis not present

## 2022-11-10 DIAGNOSIS — E785 Hyperlipidemia, unspecified: Secondary | ICD-10-CM | POA: Diagnosis not present

## 2022-11-10 DIAGNOSIS — D631 Anemia in chronic kidney disease: Secondary | ICD-10-CM | POA: Diagnosis not present

## 2022-11-10 DIAGNOSIS — Z7401 Bed confinement status: Secondary | ICD-10-CM | POA: Diagnosis not present

## 2022-11-10 DIAGNOSIS — R0902 Hypoxemia: Secondary | ICD-10-CM | POA: Diagnosis not present

## 2022-11-10 DIAGNOSIS — G894 Chronic pain syndrome: Secondary | ICD-10-CM | POA: Diagnosis not present

## 2022-11-10 LAB — COMPREHENSIVE METABOLIC PANEL
ALT: 25 U/L (ref 0–44)
AST: 31 U/L (ref 15–41)
Albumin: 2.6 g/dL — ABNORMAL LOW (ref 3.5–5.0)
Alkaline Phosphatase: 118 U/L (ref 38–126)
Anion gap: 12 (ref 5–15)
BUN: 19 mg/dL (ref 8–23)
CO2: 24 mmol/L (ref 22–32)
Calcium: 7.1 mg/dL — ABNORMAL LOW (ref 8.9–10.3)
Chloride: 104 mmol/L (ref 98–111)
Creatinine, Ser: 1.25 mg/dL — ABNORMAL HIGH (ref 0.44–1.00)
GFR, Estimated: 45 mL/min — ABNORMAL LOW (ref >60)
Glucose, Bld: 122 mg/dL — ABNORMAL HIGH (ref 70–99)
Potassium: 4 mmol/L (ref 3.5–5.1)
Sodium: 140 mmol/L (ref 135–145)
Total Bilirubin: 0.4 mg/dL (ref 0.3–1.2)
Total Protein: 6.3 g/dL — ABNORMAL LOW (ref 6.5–8.1)

## 2022-11-10 LAB — CBC WITH DIFFERENTIAL/PLATELET
Abs Immature Granulocytes: 0.16 10*3/uL — ABNORMAL HIGH (ref 0.00–0.07)
Basophils Absolute: 0.1 10*3/uL (ref 0.0–0.1)
Basophils Relative: 0 %
Eosinophils Absolute: 0.2 10*3/uL (ref 0.0–0.5)
Eosinophils Relative: 1 %
HCT: 29.6 % — ABNORMAL LOW (ref 36.0–46.0)
Hemoglobin: 9 g/dL — ABNORMAL LOW (ref 12.0–15.0)
Immature Granulocytes: 1 %
Lymphocytes Relative: 6 %
Lymphs Abs: 0.8 10*3/uL (ref 0.7–4.0)
MCH: 29.2 pg (ref 26.0–34.0)
MCHC: 30.4 g/dL (ref 30.0–36.0)
MCV: 96.1 fL (ref 80.0–100.0)
Monocytes Absolute: 0.8 10*3/uL (ref 0.1–1.0)
Monocytes Relative: 6 %
Neutro Abs: 11 10*3/uL — ABNORMAL HIGH (ref 1.7–7.7)
Neutrophils Relative %: 86 %
Platelets: 323 10*3/uL (ref 150–400)
RBC: 3.08 MIL/uL — ABNORMAL LOW (ref 3.87–5.11)
RDW: 14.6 % (ref 11.5–15.5)
WBC: 13 10*3/uL — ABNORMAL HIGH (ref 4.0–10.5)
nRBC: 0 % (ref 0.0–0.2)

## 2022-11-10 LAB — BRAIN NATRIURETIC PEPTIDE: B Natriuretic Peptide: 200.6 pg/mL — ABNORMAL HIGH (ref 0.0–100.0)

## 2022-11-10 MED ORDER — DOXYCYCLINE HYCLATE 100 MG PO TABS
100.0000 mg | ORAL_TABLET | Freq: Once | ORAL | Status: AC
  Administered 2022-11-10: 100 mg via ORAL
  Filled 2022-11-10: qty 1

## 2022-11-10 MED ORDER — DOXYCYCLINE HYCLATE 100 MG PO CAPS: 100.0000 mg | ORAL_CAPSULE | Freq: Two times a day (BID) | ORAL | 0 refills | Status: AC

## 2022-11-10 NOTE — ED Provider Notes (Signed)
[facility]ERGENCY DEPARTMENT AT [facility] Provider Note   CSN: [id number] Arrival date & time: 11/10/22  1440     History  Chief Complaint  Patient presents with   Leg Pain    [name] is a 77 y.o. female.  Patient here bilateral leg pain.  Recently admitted with bladder perforation.  She denies any abdominal pain.  Lasix was decreased recently.  She had a little bit more swelling in her legs.  She got little bit more redness at both ankles.  She is having some discomfort in this area.  She has not been on antibiotics.  She has history of hypertension, diabetes.  She denies any headache, chest pain, shortness of breath, abdominal pain.  She states that she has been doing well since her surgery but having pain at the red spots in her legs bilaterally.  She is not on blood thinners.  The history is provided by the patient.       Home Medications Prior to Admission medications   Medication Sig Start Date End Date Taking? Authorizing Provider  acetaminophen (TYLENOL) 500 MG tablet Take 1,000 mg by mouth every 6 (six) hours as needed for fever (or pain).   Yes [provider]  aspirin 81 MG chewable tablet Chew 81 mg by mouth daily. 05/24/21  Yes [provider]  Benzocaine-Menthol (SORE THROAT) 15-3.6 MG LOZG Use as directed 1 lozenge in the mouth or throat every 2 (two) hours as needed (sore throat).   Yes [provider]  benztropine (COGENTIN) 0.5 MG tablet Take 0.5 mg by mouth in the morning and at bedtime.   Yes [provider]  Cholecalciferol (VITAMIN D3) 50 MCG (2000 UT) TABS Take 2,000 Units by mouth in the morning.   Yes [provider]  Dextromethorphan-guaiFENesin 5-100 MG/5ML LIQD Take 20 mLs by mouth every 4 (four) hours as needed (cough/congestion). 09/10/22  Yes [provider]  doxycycline (VIBRAMYCIN) 100 MG capsule Take 1 capsule (100 mg total) by mouth 2 (two) times daily for 10 days.  11/10/22 11/20/22 Yes Jesilyn Easom, DO  ENULOSE 10 GM/15ML SOLN Take 10 g by mouth daily. 07/25/22  Yes [provider]  escitalopram (LEXAPRO) 10 MG tablet Take 15 mg by mouth at bedtime.   Yes [provider]  ferrous sulfate 325 (65 FE) MG EC tablet Take 325 mg by mouth every Monday, Wednesday, and Friday.   Yes [provider]  HYDROcodone-acetaminophen (NORCO/VICODIN) 5-325 MG tablet Take 1 tablet by mouth every 4 (four) hours as needed (for pain).   Yes [provider]  hydroxychloroquine (PLAQUENIL) 200 MG tablet Take 600 mg by mouth daily.   Yes [provider]  hydrOXYzine (VISTARIL) 50 MG capsule Take 50 mg by mouth in the morning and at bedtime. 09/17/22  Yes [provider]  ipratropium (ATROVENT) 0.03 % nasal spray Place 2 sprays into both nostrils 2 (two) times daily as needed for rhinitis. 05/16/22  Yes [provider]  LASIX 20 MG tablet Take 20 mg by mouth in the morning.   Yes [provider]  leflunomide (ARAVA) 20 MG tablet Take 20 mg by mouth daily.   Yes [provider]  loperamide (IMODIUM A-D) 2 MG tablet Take 2 mg by mouth See admin instructions. Take 2 mg by mouth as needed/as directed after each loose stool- MAX OF 3 TABLETS/24 HOURS   Yes [provider]  loratadine (CLARITIN) 10 MG tablet Take 1 tablet (10  mg total) by mouth daily. Patient taking differently: Take 10 mg by mouth in the morning. 08/22/22  Yes Achille Rich, PA-C  melatonin 5 MG TABS Take 5 mg by mouth at bedtime.   Yes [provider]  metoprolol succinate (TOPROL-XL) 25 MG 24 hr tablet Take 25 mg by mouth daily.   Yes [provider]  nystatin cream (MYCOSTATIN) Apply 1 Application topically 2 (two) times daily as needed (for redness- under abdominal folds and/or under the breasts). 07/24/22  Yes [provider]  OLANZapine (ZYPREXA) 5 MG tablet Take 5 mg by mouth daily.   Yes [provider]  polyethylene glycol (MIRALAX / GLYCOLAX) 17 g packet Take 17 g by mouth 2 (two) times daily. Patient taking differently: Take 17 g by mouth in the morning and at bedtime. 12/31/20  Yes Regalado, Belkys A, MD  predniSONE (DELTASONE) 5 MG tablet Take 5 mg by mouth daily with breakfast.   Yes [provider]  vitamin C (ASCORBIC ACID) 250 MG tablet Take 250 mg by mouth every Monday, Wednesday, and Friday.   Yes [provider]  ACCU-CHEK GUIDE test strip Check blood sugar every morning 10/29/17   [provider]  furosemide (LASIX) 40 MG tablet Take 0.5 tablets (20 mg total) by mouth daily. Patient not taking: Reported on 11/10/2022 11/03/22   Alwyn Ren, MD  trimethoprim-polymyxin b Joaquim Lai) ophthalmic solution Place 1 drop into both eyes every 4 (four) hours. While awake Patient not taking: Reported on 10/01/2022 08/22/22   Achille Rich, PA-C      Allergies    Iodinated contrast media, Abilify [aripiprazole], Ultram [tramadol hcl], and Zithromax [azithromycin]    Review of Systems   Review of Systems  Physical Exam Updated Vital Signs BP 121/60 (BP Location: Right Arm)   Pulse 96   Temp 98.3 F (36.8 C) (Oral)   Resp 17   Ht 5\' 4"  (1.626 m)   Wt 77.1 kg   SpO2 98%   BMI 29.18 kg/m  Physical Exam Vitals and nursing note reviewed.  Constitutional:      General: She is not in acute distress.    Appearance: She is well-developed. She is not ill-appearing.  HENT:     Head: Normocephalic and atraumatic.     Nose: Nose normal.     Mouth/Throat:     Mouth: Mucous membranes are moist.  Eyes:     Extraocular Movements: Extraocular movements intact.     Conjunctiva/sclera: Conjunctivae normal.     Pupils: Pupils are equal, round, and reactive to light.  Cardiovascular:     Rate and Rhythm: Normal rate and regular rhythm.     Pulses: Normal pulses.     Heart sounds: Normal heart sounds. No murmur heard. Pulmonary:     Effort: Pulmonary effort  is normal. No respiratory distress.     Breath sounds: Normal breath sounds.  Abdominal:     Palpations: Abdomen is soft.     Tenderness: There is no abdominal tenderness.  Musculoskeletal:        General: No swelling.     Cervical back: Normal range of motion and neck supple.     Right lower leg: Edema present.     Left lower leg: Edema present.     Comments: 1+ pitting edema bilaterally  Skin:    General: Skin is warm and dry.     Capillary Refill: Capillary refill takes less than 2 seconds.     Findings: Erythema present.  Comments: She has venous stasis changes and some erythema to bilateral mid tibia, right leg little bit more edematous than the left  Neurological:     General: No focal deficit present.     Mental Status: She is alert.  Psychiatric:        Mood and Affect: Mood normal.     ED Results / Procedures / Treatments   Labs (all labs ordered are listed, but only abnormal results are displayed) Labs Reviewed  CBC WITH DIFFERENTIAL/PLATELET - Abnormal; Notable for the following components:      Result Value   WBC 13.0 (*)    RBC 3.08 (*)    Hemoglobin 9.0 (*)    HCT 29.6 (*)    Neutro Abs 11.0 (*)    Abs Immature Granulocytes 0.16 (*)    All other components within normal limits  COMPREHENSIVE METABOLIC PANEL - Abnormal; Notable for the following components:   Glucose, Bld 122 (*)    Creatinine, Ser 1.25 (*)    Calcium 7.1 (*)    Total Protein 6.3 (*)    Albumin 2.6 (*)    GFR, Estimated 45 (*)    All other components within normal limits  BRAIN NATRIURETIC PEPTIDE - Abnormal; Notable for the following components:   B Natriuretic Peptide 200.6 (*)    All other components within normal limits    EKG None  Radiology CT CHEST ABDOMEN PELVIS WO CONTRAST  Result Date: 11/10/2022 CLINICAL DATA:  Per EMS patient is here for swelling in lower legs. Recently decrease Lasix. Leg wounds. EXAM: CT CHEST, ABDOMEN AND PELVIS WITHOUT CONTRAST TECHNIQUE:  Multidetector CT imaging of the chest, abdomen and pelvis was performed following the standard protocol without IV contrast. RADIATION DOSE REDUCTION: This exam was performed according to the departmental dose-optimization program which includes automated exposure control, adjustment of the mA and/or kV according to patient size and/or use of iterative reconstruction technique. COMPARISON:  Radiograph 11/10/2022; CT cystogram 10/30/2022; CT abdomen and pelvis 10/30/2022 FINDINGS: CT CHEST FINDINGS Cardiovascular: Normal heart size. No pericardial effusion. Aortic atherosclerotic calcification. Mediastinum/Nodes: Heterogenous enlargement of the right thyroid lobe measuring up to 3.1 cm. Unremarkable esophagus. No thoracic adenopathy. Lungs/Pleura: Bronchial wall thickening and mucous plugging greatest in the left lower lobe with associated streaky atelectasis or scarring. No pleural effusion or pneumothorax. Musculoskeletal: Partially visualized cervical spine fusion. No acute fracture. CT ABDOMEN PELVIS FINDINGS Hepatobiliary: No focal liver abnormality is seen. No gallstones, gallbladder wall thickening, or biliary dilatation. Pancreas: Unremarkable. Spleen: Unremarkable. Adrenals/Urinary Tract: Normal adrenal glands. Absent left kidney. No urinary calculi or hydronephrosis. Foley catheter in the nondistended bladder. There is perivesical fat stranding in the pelvis. Mild bladder wall thickening. Stomach/Bowel: Normal caliber large and small bowel. No bowel wall thickening. Normal appendix. Stomach is within normal limits. Vascular/Lymphatic: Aortic atherosclerosis. No enlarged abdominal or pelvic lymph nodes. Reproductive: Status post hysterectomy. No adnexal masses. Other: Cutaneous staples in the midline abdomen with subcutaneous fat stranding and small fluid and gas collection in the anterior abdominal measuring 4.3 x 1.9 cm. This is likely postoperative. No significant wall thickening. No free intraperitoneal  air. Trace free fluid in the pelvis. Musculoskeletal: Posterior fusion L2-L5. Thoracolumbar spondylosis. No acute fracture. 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 No subchondral collapse. IMPRESSION: 1. Perivesical fat stranding in the pelvis with bladder wall thickening. This is likely postoperative given recent bladder rupture repair. Correlate with urinalysis to exclude infection. 2. Small fluid and gas collection in the anterior abdominal  wall measuring 4.3 x 1.9 cm, likely a postoperative seroma. 3. No free intraperitoneal air. The finding on same-day radiograph may have resolved or been artifactual. 4. Bronchial wall thickening and mucous plugging greatest in the left lower lobe with associated streaky atelectasis or scarring. 5. Heterogenous enlargement of the right thyroid lobe measuring up to 3.1 cm. Recommend nonemergent thyroid ultrasound. (ref: J Am Coll Radiol. 2015 Feb;12(2): 143-50). Aortic Atherosclerosis (ICD10-I70.0). Electronically Signed   By: Minerva Fester M.D.   On: 11/10/2022 19:12   VAS [place] LOWER EXTREMITY VENOUS (DVT) (ONLY [facility] & [facility])  Result Date: 11/10/2022  Lower Venous DVT Study Patient Name:  [name] [facility]  Date of Exam:   11/10/2022 Medical Rec #: [id number]           Accession #:    [id number] Date of Birth: 27-Apr-1946           Patient Gender: F Patient Age:   38 years Exam Location:  [facility] Procedure:      VAS [place] LOWER EXTREMITY VENOUS (DVT) Referring Phys: Jabar Krysiak --------------------------------------------------------------------------------  Indications: Edema.  Risk Factors: None identified. Limitations: Poor ultrasound/tissue interface. Comparison Study: No prior studies. Performing Technologist: Chanda Busing RVT  Examination Guidelines: A complete evaluation includes B-mode imaging, spectral Doppler, color Doppler, and power Doppler as needed of all accessible portions of each vessel. Bilateral testing is considered an integral  part of a complete examination. Limited examinations for reoccurring indications may be performed as noted. The reflux portion of the exam is performed with the patient in reverse Trendelenburg.  +---------+---------------+---------+-----------+----------+--------------+ RIGHT    CompressibilityPhasicitySpontaneityPropertiesThrombus Aging +---------+---------------+---------+-----------+----------+--------------+ CFV      Full           Yes      Yes                                 +---------+---------------+---------+-----------+----------+--------------+ SFJ      Full                                                        +---------+---------------+---------+-----------+----------+--------------+ FV Prox  Full                                                        +---------+---------------+---------+-----------+----------+--------------+ FV Mid   Full                                                        +---------+---------------+---------+-----------+----------+--------------+ FV DistalFull                                                        +---------+---------------+---------+-----------+----------+--------------+ PFV      Full                                                        +---------+---------------+---------+-----------+----------+--------------+  POP      Full           Yes      Yes                                 +---------+---------------+---------+-----------+----------+--------------+ PTV      Full                                                        +---------+---------------+---------+-----------+----------+--------------+ PERO     Full                                                        +---------+---------------+---------+-----------+----------+--------------+   +---------+---------------+---------+-----------+----------+-------------------+ LEFT     CompressibilityPhasicitySpontaneityPropertiesThrombus Aging       +---------+---------------+---------+-----------+----------+-------------------+ CFV      Full           Yes      Yes                                      +---------+---------------+---------+-----------+----------+-------------------+ SFJ      Full                                                             +---------+---------------+---------+-----------+----------+-------------------+ FV Prox  Full                                                             +---------+---------------+---------+-----------+----------+-------------------+ FV Mid                  Yes      Yes                                      +---------+---------------+---------+-----------+----------+-------------------+ FV Distal               Yes      Yes                                      +---------+---------------+---------+-----------+----------+-------------------+ PFV      Full                                                             +---------+---------------+---------+-----------+----------+-------------------+ POP      Full  Yes      Yes                                      +---------+---------------+---------+-----------+----------+-------------------+ PTV                                                   Not well visualized +---------+---------------+---------+-----------+----------+-------------------+ PERO                                                  Not well visualized +---------+---------------+---------+-----------+----------+-------------------+    Summary: RIGHT: - There is no evidence of deep vein thrombosis in the lower extremity. However, portions of this examination were limited- see technologist comments above.  - No cystic structure found in the popliteal fossa.  LEFT: - There is no evidence of deep vein thrombosis in the lower extremity. However, portions of this examination were limited- see technologist comments above.  - No cystic  structure found in the popliteal fossa.  *See table(s) above for measurements and observations.    Preliminary    DG Chest Portable 1 View  Result Date: 11/10/2022 CLINICAL DATA:  Shortness of breath EXAM: PORTABLE CHEST 1 VIEW COMPARISON:  None Available. FINDINGS: Unchanged cardiomediastinal silhouette. Streaky bibasilar opacities. No large effusion or evidence of pneumothorax. There is lucency under the right hemidiaphragm. No acute osseous abnormality. Thoracic spondylosis. Partially visualized cervical and lumbar spine fusion hardware. IMPRESSION: Lucency under the right hemidiaphragm concerning for pneumoperitoneum. Per medical record, patient had a recent exploratory laparotomy on 10/30/2022, and this could potentially be postoperative. Bibasilar streaky opacities, likely atelectasis. No overt pulmonary edema. Critical Value/emergent results were called by telephone at the time of interpretation on 11/10/2022 at 4:11 pm to provider Urie Loughner , who verbally acknowledged these results. Electronically Signed   By: Caprice Renshaw M.D.   On: 11/10/2022 16:14    Procedures Procedures    Medications Ordered in ED Medications  doxycycline (VIBRA-TABS) tablet 100 mg (has no administration in time range)    ED Course/ Medical Decision Making/ A&P                             Medical Decision Making Amount and/or Complexity of Data Reviewed Labs: ordered. Radiology: ordered.  Risk Prescription drug management.   [name] is here with pain to both of her ankles with some redness.  History of hypertension, diabetes and appears to be venous stasis.  She was just recently hospitalized for bladder perforation.  She feels like she is doing well.  No abdominal pain.  Overall looks like she has some venous stasis changes to bilateral ankle areas.  Little bit erythematous but very localized.  Sounds like they decrease Lasix dose recently.  She not having any shortness of breath.  Denies any  chest pain.  Denies any fever or chills.  The right leg does look a little bit more enlarged than the left.  Will get DVT study check basic labs and cxr.  Will reevaluate.  Per my review interpretation labs no significant anemia or electrolyte abnormality or kidney injury.  Cardiac workup is unremarkable.  BNP within markable.  No signs of volume overload on chest x-ray.  Radiology did state that x-ray was somewhat poor quality and he thought maybe there is some free air under the diaphragm which he thought that given that her surgery was 11 days ago was a little abnormal and that we should get a CT scan to further evaluate.  She not have any abdominal pain.  CT scan overall seems unremarkable.  She has what appears to be likely a postoperative seroma.  But they do not see any free intraperitoneal air.  There is no acute findings on the CT scan.  She has no fever.  She is well-appearing.  Ultimately I think the wounds on her legs are from chronic venous stasis and pressure.  Will put her on doxycycline and have her follow-up with the wound care team.  Discharged in good condition.  This chart was dictated using voice recognition software.  Despite best efforts to proofread,  errors can occur which can change the documentation meaning.          Final Clinical Impression(s) / ED Diagnoses Final diagnoses:  Wound of left lower extremity, initial encounter  Wound of right lower extremity, initial encounter    Rx / DC Orders ED Discharge Orders          Ordered    doxycycline (VIBRAMYCIN) 100 MG capsule  2 times daily        11/10/22 1926              Virgina Norfolk, DO 11/10/22 1929

## 2022-11-10 NOTE — Progress Notes (Signed)
Bilateral lower extremity venous duplex has been completed. Preliminary results can be found in CV Proc through chart review.  Results were given to Dr. Lockie Mola.  11/10/22 6:47 PM Olen Cordial RVT

## 2022-11-10 NOTE — ED Triage Notes (Signed)
Pt to er via ems, per ems pt is here for some swelling in her lower legs, states that they recently decreased her lasix, states that pt has some wounds on her legs.  Pt is from [facility] at [address].

## 2022-11-10 NOTE — Discharge Instructions (Signed)
Overall you do not have blood clots in your legs.  Follow-up with urologist as scheduled.  I think the wounds that you have on your legs are from pressure.  Recommend that you wrap the legs daily to try to avoid any increased pressure from bed or chairs.  I have referred you to the wound clinic for follow-up.  Take your next dose antibiotic tomorrow morning.

## 2022-11-10 NOTE — ED Notes (Signed)
[facility] called for transport back to [facility].  

## 2022-11-11 DIAGNOSIS — D631 Anemia in chronic kidney disease: Secondary | ICD-10-CM | POA: Diagnosis not present

## 2022-11-11 DIAGNOSIS — S80822D Blister (nonthermal), left lower leg, subsequent encounter: Secondary | ICD-10-CM | POA: Diagnosis not present

## 2022-11-11 DIAGNOSIS — G894 Chronic pain syndrome: Secondary | ICD-10-CM | POA: Diagnosis not present

## 2022-11-11 DIAGNOSIS — J309 Allergic rhinitis, unspecified: Secondary | ICD-10-CM | POA: Diagnosis not present

## 2022-11-11 DIAGNOSIS — N183 Chronic kidney disease, stage 3 unspecified: Secondary | ICD-10-CM | POA: Diagnosis not present

## 2022-11-11 DIAGNOSIS — C679 Malignant neoplasm of bladder, unspecified: Secondary | ICD-10-CM | POA: Diagnosis not present

## 2022-11-11 DIAGNOSIS — M4800 Spinal stenosis, site unspecified: Secondary | ICD-10-CM | POA: Diagnosis not present

## 2022-11-11 DIAGNOSIS — I129 Hypertensive chronic kidney disease with stage 1 through stage 4 chronic kidney disease, or unspecified chronic kidney disease: Secondary | ICD-10-CM | POA: Diagnosis not present

## 2022-11-11 DIAGNOSIS — E1122 Type 2 diabetes mellitus with diabetic chronic kidney disease: Secondary | ICD-10-CM | POA: Diagnosis not present

## 2022-11-11 DIAGNOSIS — Z993 Dependence on wheelchair: Secondary | ICD-10-CM | POA: Diagnosis not present

## 2022-11-11 DIAGNOSIS — M069 Rheumatoid arthritis, unspecified: Secondary | ICD-10-CM | POA: Diagnosis not present

## 2022-11-11 DIAGNOSIS — K5900 Constipation, unspecified: Secondary | ICD-10-CM | POA: Diagnosis not present

## 2022-11-11 DIAGNOSIS — G3184 Mild cognitive impairment, so stated: Secondary | ICD-10-CM | POA: Diagnosis not present

## 2022-11-11 DIAGNOSIS — S81801D Unspecified open wound, right lower leg, subsequent encounter: Secondary | ICD-10-CM | POA: Diagnosis not present

## 2022-11-11 DIAGNOSIS — E559 Vitamin D deficiency, unspecified: Secondary | ICD-10-CM | POA: Diagnosis not present

## 2022-11-11 DIAGNOSIS — Z7982 Long term (current) use of aspirin: Secondary | ICD-10-CM | POA: Diagnosis not present

## 2022-11-11 DIAGNOSIS — E785 Hyperlipidemia, unspecified: Secondary | ICD-10-CM | POA: Diagnosis not present

## 2022-11-11 DIAGNOSIS — Z8551 Personal history of malignant neoplasm of bladder: Secondary | ICD-10-CM | POA: Diagnosis not present

## 2022-11-11 DIAGNOSIS — E1151 Type 2 diabetes mellitus with diabetic peripheral angiopathy without gangrene: Secondary | ICD-10-CM | POA: Diagnosis not present

## 2022-11-11 DIAGNOSIS — Z7952 Long term (current) use of systemic steroids: Secondary | ICD-10-CM | POA: Diagnosis not present

## 2022-11-11 DIAGNOSIS — I739 Peripheral vascular disease, unspecified: Secondary | ICD-10-CM | POA: Diagnosis not present

## 2022-11-11 DIAGNOSIS — G4700 Insomnia, unspecified: Secondary | ICD-10-CM | POA: Diagnosis not present

## 2022-11-12 DIAGNOSIS — I129 Hypertensive chronic kidney disease with stage 1 through stage 4 chronic kidney disease, or unspecified chronic kidney disease: Secondary | ICD-10-CM | POA: Diagnosis not present

## 2022-11-12 DIAGNOSIS — S81801D Unspecified open wound, right lower leg, subsequent encounter: Secondary | ICD-10-CM | POA: Diagnosis not present

## 2022-11-12 DIAGNOSIS — N183 Chronic kidney disease, stage 3 unspecified: Secondary | ICD-10-CM | POA: Diagnosis not present

## 2022-11-12 DIAGNOSIS — Z8551 Personal history of malignant neoplasm of bladder: Secondary | ICD-10-CM | POA: Diagnosis not present

## 2022-11-12 DIAGNOSIS — S80822D Blister (nonthermal), left lower leg, subsequent encounter: Secondary | ICD-10-CM | POA: Diagnosis not present

## 2022-11-12 DIAGNOSIS — E1122 Type 2 diabetes mellitus with diabetic chronic kidney disease: Secondary | ICD-10-CM | POA: Diagnosis not present

## 2022-11-12 DIAGNOSIS — M4800 Spinal stenosis, site unspecified: Secondary | ICD-10-CM | POA: Diagnosis not present

## 2022-11-12 DIAGNOSIS — Z993 Dependence on wheelchair: Secondary | ICD-10-CM | POA: Diagnosis not present

## 2022-11-12 DIAGNOSIS — E785 Hyperlipidemia, unspecified: Secondary | ICD-10-CM | POA: Diagnosis not present

## 2022-11-12 DIAGNOSIS — J309 Allergic rhinitis, unspecified: Secondary | ICD-10-CM | POA: Diagnosis not present

## 2022-11-12 DIAGNOSIS — K5900 Constipation, unspecified: Secondary | ICD-10-CM | POA: Diagnosis not present

## 2022-11-12 DIAGNOSIS — M069 Rheumatoid arthritis, unspecified: Secondary | ICD-10-CM | POA: Diagnosis not present

## 2022-11-12 DIAGNOSIS — G3184 Mild cognitive impairment, so stated: Secondary | ICD-10-CM | POA: Diagnosis not present

## 2022-11-12 DIAGNOSIS — G4700 Insomnia, unspecified: Secondary | ICD-10-CM | POA: Diagnosis not present

## 2022-11-12 DIAGNOSIS — E1151 Type 2 diabetes mellitus with diabetic peripheral angiopathy without gangrene: Secondary | ICD-10-CM | POA: Diagnosis not present

## 2022-11-12 DIAGNOSIS — Z7982 Long term (current) use of aspirin: Secondary | ICD-10-CM | POA: Diagnosis not present

## 2022-11-12 DIAGNOSIS — Z7952 Long term (current) use of systemic steroids: Secondary | ICD-10-CM | POA: Diagnosis not present

## 2022-11-12 DIAGNOSIS — E559 Vitamin D deficiency, unspecified: Secondary | ICD-10-CM | POA: Diagnosis not present

## 2022-11-12 DIAGNOSIS — D631 Anemia in chronic kidney disease: Secondary | ICD-10-CM | POA: Diagnosis not present

## 2022-11-12 DIAGNOSIS — G894 Chronic pain syndrome: Secondary | ICD-10-CM | POA: Diagnosis not present

## 2022-11-13 DIAGNOSIS — N183 Chronic kidney disease, stage 3 unspecified: Secondary | ICD-10-CM | POA: Diagnosis not present

## 2022-11-13 DIAGNOSIS — Z7952 Long term (current) use of systemic steroids: Secondary | ICD-10-CM | POA: Diagnosis not present

## 2022-11-13 DIAGNOSIS — J309 Allergic rhinitis, unspecified: Secondary | ICD-10-CM | POA: Diagnosis not present

## 2022-11-13 DIAGNOSIS — E1151 Type 2 diabetes mellitus with diabetic peripheral angiopathy without gangrene: Secondary | ICD-10-CM | POA: Diagnosis not present

## 2022-11-13 DIAGNOSIS — S80822D Blister (nonthermal), left lower leg, subsequent encounter: Secondary | ICD-10-CM | POA: Diagnosis not present

## 2022-11-13 DIAGNOSIS — Z8551 Personal history of malignant neoplasm of bladder: Secondary | ICD-10-CM | POA: Diagnosis not present

## 2022-11-13 DIAGNOSIS — E559 Vitamin D deficiency, unspecified: Secondary | ICD-10-CM | POA: Diagnosis not present

## 2022-11-13 DIAGNOSIS — S81801D Unspecified open wound, right lower leg, subsequent encounter: Secondary | ICD-10-CM | POA: Diagnosis not present

## 2022-11-13 DIAGNOSIS — Z993 Dependence on wheelchair: Secondary | ICD-10-CM | POA: Diagnosis not present

## 2022-11-13 DIAGNOSIS — D631 Anemia in chronic kidney disease: Secondary | ICD-10-CM | POA: Diagnosis not present

## 2022-11-13 DIAGNOSIS — E785 Hyperlipidemia, unspecified: Secondary | ICD-10-CM | POA: Diagnosis not present

## 2022-11-13 DIAGNOSIS — I129 Hypertensive chronic kidney disease with stage 1 through stage 4 chronic kidney disease, or unspecified chronic kidney disease: Secondary | ICD-10-CM | POA: Diagnosis not present

## 2022-11-13 DIAGNOSIS — Z7982 Long term (current) use of aspirin: Secondary | ICD-10-CM | POA: Diagnosis not present

## 2022-11-13 DIAGNOSIS — G894 Chronic pain syndrome: Secondary | ICD-10-CM | POA: Diagnosis not present

## 2022-11-13 DIAGNOSIS — G3184 Mild cognitive impairment, so stated: Secondary | ICD-10-CM | POA: Diagnosis not present

## 2022-11-13 DIAGNOSIS — K5900 Constipation, unspecified: Secondary | ICD-10-CM | POA: Diagnosis not present

## 2022-11-13 DIAGNOSIS — M4800 Spinal stenosis, site unspecified: Secondary | ICD-10-CM | POA: Diagnosis not present

## 2022-11-13 DIAGNOSIS — E1122 Type 2 diabetes mellitus with diabetic chronic kidney disease: Secondary | ICD-10-CM | POA: Diagnosis not present

## 2022-11-13 DIAGNOSIS — G4700 Insomnia, unspecified: Secondary | ICD-10-CM | POA: Diagnosis not present

## 2022-11-13 DIAGNOSIS — M069 Rheumatoid arthritis, unspecified: Secondary | ICD-10-CM | POA: Diagnosis not present

## 2022-11-17 DIAGNOSIS — N183 Chronic kidney disease, stage 3 unspecified: Secondary | ICD-10-CM | POA: Diagnosis not present

## 2022-11-17 DIAGNOSIS — S80822D Blister (nonthermal), left lower leg, subsequent encounter: Secondary | ICD-10-CM | POA: Diagnosis not present

## 2022-11-17 DIAGNOSIS — I129 Hypertensive chronic kidney disease with stage 1 through stage 4 chronic kidney disease, or unspecified chronic kidney disease: Secondary | ICD-10-CM | POA: Diagnosis not present

## 2022-11-17 DIAGNOSIS — S81801D Unspecified open wound, right lower leg, subsequent encounter: Secondary | ICD-10-CM | POA: Diagnosis not present

## 2022-11-17 DIAGNOSIS — E1122 Type 2 diabetes mellitus with diabetic chronic kidney disease: Secondary | ICD-10-CM | POA: Diagnosis not present

## 2022-11-17 DIAGNOSIS — K5900 Constipation, unspecified: Secondary | ICD-10-CM | POA: Diagnosis not present

## 2022-11-17 DIAGNOSIS — M069 Rheumatoid arthritis, unspecified: Secondary | ICD-10-CM | POA: Diagnosis not present

## 2022-11-17 DIAGNOSIS — J309 Allergic rhinitis, unspecified: Secondary | ICD-10-CM | POA: Diagnosis not present

## 2022-11-17 DIAGNOSIS — E785 Hyperlipidemia, unspecified: Secondary | ICD-10-CM | POA: Diagnosis not present

## 2022-11-17 DIAGNOSIS — E1151 Type 2 diabetes mellitus with diabetic peripheral angiopathy without gangrene: Secondary | ICD-10-CM | POA: Diagnosis not present

## 2022-11-17 DIAGNOSIS — E559 Vitamin D deficiency, unspecified: Secondary | ICD-10-CM | POA: Diagnosis not present

## 2022-11-17 DIAGNOSIS — G894 Chronic pain syndrome: Secondary | ICD-10-CM | POA: Diagnosis not present

## 2022-11-17 DIAGNOSIS — Z8551 Personal history of malignant neoplasm of bladder: Secondary | ICD-10-CM | POA: Diagnosis not present

## 2022-11-17 DIAGNOSIS — G3184 Mild cognitive impairment, so stated: Secondary | ICD-10-CM | POA: Diagnosis not present

## 2022-11-17 DIAGNOSIS — Z7982 Long term (current) use of aspirin: Secondary | ICD-10-CM | POA: Diagnosis not present

## 2022-11-17 DIAGNOSIS — G4700 Insomnia, unspecified: Secondary | ICD-10-CM | POA: Diagnosis not present

## 2022-11-17 DIAGNOSIS — M4800 Spinal stenosis, site unspecified: Secondary | ICD-10-CM | POA: Diagnosis not present

## 2022-11-17 DIAGNOSIS — Z993 Dependence on wheelchair: Secondary | ICD-10-CM | POA: Diagnosis not present

## 2022-11-17 DIAGNOSIS — Z7952 Long term (current) use of systemic steroids: Secondary | ICD-10-CM | POA: Diagnosis not present

## 2022-11-17 DIAGNOSIS — D631 Anemia in chronic kidney disease: Secondary | ICD-10-CM | POA: Diagnosis not present

## 2022-11-21 DIAGNOSIS — E1151 Type 2 diabetes mellitus with diabetic peripheral angiopathy without gangrene: Secondary | ICD-10-CM | POA: Diagnosis not present

## 2022-11-21 DIAGNOSIS — N183 Chronic kidney disease, stage 3 unspecified: Secondary | ICD-10-CM | POA: Diagnosis not present

## 2022-11-21 DIAGNOSIS — Z7952 Long term (current) use of systemic steroids: Secondary | ICD-10-CM | POA: Diagnosis not present

## 2022-11-21 DIAGNOSIS — I129 Hypertensive chronic kidney disease with stage 1 through stage 4 chronic kidney disease, or unspecified chronic kidney disease: Secondary | ICD-10-CM | POA: Diagnosis not present

## 2022-11-21 DIAGNOSIS — E1122 Type 2 diabetes mellitus with diabetic chronic kidney disease: Secondary | ICD-10-CM | POA: Diagnosis not present

## 2022-11-21 DIAGNOSIS — G3184 Mild cognitive impairment, so stated: Secondary | ICD-10-CM | POA: Diagnosis not present

## 2022-11-21 DIAGNOSIS — G894 Chronic pain syndrome: Secondary | ICD-10-CM | POA: Diagnosis not present

## 2022-11-21 DIAGNOSIS — S81801D Unspecified open wound, right lower leg, subsequent encounter: Secondary | ICD-10-CM | POA: Diagnosis not present

## 2022-11-21 DIAGNOSIS — J309 Allergic rhinitis, unspecified: Secondary | ICD-10-CM | POA: Diagnosis not present

## 2022-11-21 DIAGNOSIS — Z993 Dependence on wheelchair: Secondary | ICD-10-CM | POA: Diagnosis not present

## 2022-11-21 DIAGNOSIS — D631 Anemia in chronic kidney disease: Secondary | ICD-10-CM | POA: Diagnosis not present

## 2022-11-21 DIAGNOSIS — G4700 Insomnia, unspecified: Secondary | ICD-10-CM | POA: Diagnosis not present

## 2022-11-21 DIAGNOSIS — Z7982 Long term (current) use of aspirin: Secondary | ICD-10-CM | POA: Diagnosis not present

## 2022-11-21 DIAGNOSIS — M4800 Spinal stenosis, site unspecified: Secondary | ICD-10-CM | POA: Diagnosis not present

## 2022-11-21 DIAGNOSIS — S80822D Blister (nonthermal), left lower leg, subsequent encounter: Secondary | ICD-10-CM | POA: Diagnosis not present

## 2022-11-21 DIAGNOSIS — K5900 Constipation, unspecified: Secondary | ICD-10-CM | POA: Diagnosis not present

## 2022-11-21 DIAGNOSIS — E785 Hyperlipidemia, unspecified: Secondary | ICD-10-CM | POA: Diagnosis not present

## 2022-11-21 DIAGNOSIS — E559 Vitamin D deficiency, unspecified: Secondary | ICD-10-CM | POA: Diagnosis not present

## 2022-11-21 DIAGNOSIS — M069 Rheumatoid arthritis, unspecified: Secondary | ICD-10-CM | POA: Diagnosis not present

## 2022-11-21 DIAGNOSIS — Z8551 Personal history of malignant neoplasm of bladder: Secondary | ICD-10-CM | POA: Diagnosis not present

## 2022-11-24 DIAGNOSIS — N183 Chronic kidney disease, stage 3 unspecified: Secondary | ICD-10-CM | POA: Diagnosis not present

## 2022-11-24 DIAGNOSIS — M4800 Spinal stenosis, site unspecified: Secondary | ICD-10-CM | POA: Diagnosis not present

## 2022-11-24 DIAGNOSIS — I129 Hypertensive chronic kidney disease with stage 1 through stage 4 chronic kidney disease, or unspecified chronic kidney disease: Secondary | ICD-10-CM | POA: Diagnosis not present

## 2022-11-24 DIAGNOSIS — G894 Chronic pain syndrome: Secondary | ICD-10-CM | POA: Diagnosis not present

## 2022-11-24 DIAGNOSIS — S81801D Unspecified open wound, right lower leg, subsequent encounter: Secondary | ICD-10-CM | POA: Diagnosis not present

## 2022-11-24 DIAGNOSIS — Z7982 Long term (current) use of aspirin: Secondary | ICD-10-CM | POA: Diagnosis not present

## 2022-11-24 DIAGNOSIS — Z8551 Personal history of malignant neoplasm of bladder: Secondary | ICD-10-CM | POA: Diagnosis not present

## 2022-11-24 DIAGNOSIS — D631 Anemia in chronic kidney disease: Secondary | ICD-10-CM | POA: Diagnosis not present

## 2022-11-24 DIAGNOSIS — J309 Allergic rhinitis, unspecified: Secondary | ICD-10-CM | POA: Diagnosis not present

## 2022-11-24 DIAGNOSIS — G3184 Mild cognitive impairment, so stated: Secondary | ICD-10-CM | POA: Diagnosis not present

## 2022-11-24 DIAGNOSIS — E785 Hyperlipidemia, unspecified: Secondary | ICD-10-CM | POA: Diagnosis not present

## 2022-11-24 DIAGNOSIS — E1151 Type 2 diabetes mellitus with diabetic peripheral angiopathy without gangrene: Secondary | ICD-10-CM | POA: Diagnosis not present

## 2022-11-24 DIAGNOSIS — G4700 Insomnia, unspecified: Secondary | ICD-10-CM | POA: Diagnosis not present

## 2022-11-24 DIAGNOSIS — E1122 Type 2 diabetes mellitus with diabetic chronic kidney disease: Secondary | ICD-10-CM | POA: Diagnosis not present

## 2022-11-24 DIAGNOSIS — E559 Vitamin D deficiency, unspecified: Secondary | ICD-10-CM | POA: Diagnosis not present

## 2022-11-24 DIAGNOSIS — S80822D Blister (nonthermal), left lower leg, subsequent encounter: Secondary | ICD-10-CM | POA: Diagnosis not present

## 2022-11-24 DIAGNOSIS — Z7952 Long term (current) use of systemic steroids: Secondary | ICD-10-CM | POA: Diagnosis not present

## 2022-11-24 DIAGNOSIS — Z993 Dependence on wheelchair: Secondary | ICD-10-CM | POA: Diagnosis not present

## 2022-11-24 DIAGNOSIS — K5900 Constipation, unspecified: Secondary | ICD-10-CM | POA: Diagnosis not present

## 2022-11-24 DIAGNOSIS — M069 Rheumatoid arthritis, unspecified: Secondary | ICD-10-CM | POA: Diagnosis not present

## 2022-11-25 DIAGNOSIS — E1122 Type 2 diabetes mellitus with diabetic chronic kidney disease: Secondary | ICD-10-CM | POA: Diagnosis not present

## 2022-11-25 DIAGNOSIS — E559 Vitamin D deficiency, unspecified: Secondary | ICD-10-CM | POA: Diagnosis not present

## 2022-11-25 DIAGNOSIS — N183 Chronic kidney disease, stage 3 unspecified: Secondary | ICD-10-CM | POA: Diagnosis not present

## 2022-11-25 DIAGNOSIS — M069 Rheumatoid arthritis, unspecified: Secondary | ICD-10-CM | POA: Diagnosis not present

## 2022-11-25 DIAGNOSIS — E785 Hyperlipidemia, unspecified: Secondary | ICD-10-CM | POA: Diagnosis not present

## 2022-11-25 DIAGNOSIS — I129 Hypertensive chronic kidney disease with stage 1 through stage 4 chronic kidney disease, or unspecified chronic kidney disease: Secondary | ICD-10-CM | POA: Diagnosis not present

## 2022-11-25 DIAGNOSIS — Z8551 Personal history of malignant neoplasm of bladder: Secondary | ICD-10-CM | POA: Diagnosis not present

## 2022-11-25 DIAGNOSIS — M4800 Spinal stenosis, site unspecified: Secondary | ICD-10-CM | POA: Diagnosis not present

## 2022-11-25 DIAGNOSIS — Z7982 Long term (current) use of aspirin: Secondary | ICD-10-CM | POA: Diagnosis not present

## 2022-11-25 DIAGNOSIS — S80822D Blister (nonthermal), left lower leg, subsequent encounter: Secondary | ICD-10-CM | POA: Diagnosis not present

## 2022-11-25 DIAGNOSIS — S81801D Unspecified open wound, right lower leg, subsequent encounter: Secondary | ICD-10-CM | POA: Diagnosis not present

## 2022-11-25 DIAGNOSIS — K5900 Constipation, unspecified: Secondary | ICD-10-CM | POA: Diagnosis not present

## 2022-11-25 DIAGNOSIS — Z993 Dependence on wheelchair: Secondary | ICD-10-CM | POA: Diagnosis not present

## 2022-11-25 DIAGNOSIS — E1151 Type 2 diabetes mellitus with diabetic peripheral angiopathy without gangrene: Secondary | ICD-10-CM | POA: Diagnosis not present

## 2022-11-25 DIAGNOSIS — Z7952 Long term (current) use of systemic steroids: Secondary | ICD-10-CM | POA: Diagnosis not present

## 2022-11-25 DIAGNOSIS — G3184 Mild cognitive impairment, so stated: Secondary | ICD-10-CM | POA: Diagnosis not present

## 2022-11-25 DIAGNOSIS — G4700 Insomnia, unspecified: Secondary | ICD-10-CM | POA: Diagnosis not present

## 2022-11-25 DIAGNOSIS — J309 Allergic rhinitis, unspecified: Secondary | ICD-10-CM | POA: Diagnosis not present

## 2022-11-25 DIAGNOSIS — D631 Anemia in chronic kidney disease: Secondary | ICD-10-CM | POA: Diagnosis not present

## 2022-11-25 DIAGNOSIS — G894 Chronic pain syndrome: Secondary | ICD-10-CM | POA: Diagnosis not present

## 2022-11-26 DIAGNOSIS — N1832 Chronic kidney disease, stage 3b: Secondary | ICD-10-CM | POA: Diagnosis not present

## 2022-11-26 DIAGNOSIS — S3729XD Other injury of bladder, subsequent encounter: Secondary | ICD-10-CM | POA: Diagnosis not present

## 2022-11-26 DIAGNOSIS — K5900 Constipation, unspecified: Secondary | ICD-10-CM | POA: Diagnosis not present

## 2022-11-26 DIAGNOSIS — I129 Hypertensive chronic kidney disease with stage 1 through stage 4 chronic kidney disease, or unspecified chronic kidney disease: Secondary | ICD-10-CM | POA: Diagnosis not present

## 2022-11-27 DIAGNOSIS — C679 Malignant neoplasm of bladder, unspecified: Secondary | ICD-10-CM | POA: Diagnosis not present

## 2022-11-28 DIAGNOSIS — S81802D Unspecified open wound, left lower leg, subsequent encounter: Secondary | ICD-10-CM | POA: Diagnosis not present

## 2022-11-28 DIAGNOSIS — Z466 Encounter for fitting and adjustment of urinary device: Secondary | ICD-10-CM | POA: Diagnosis not present

## 2022-11-28 DIAGNOSIS — I129 Hypertensive chronic kidney disease with stage 1 through stage 4 chronic kidney disease, or unspecified chronic kidney disease: Secondary | ICD-10-CM | POA: Diagnosis not present

## 2022-12-01 DIAGNOSIS — E785 Hyperlipidemia, unspecified: Secondary | ICD-10-CM | POA: Diagnosis not present

## 2022-12-01 DIAGNOSIS — M4800 Spinal stenosis, site unspecified: Secondary | ICD-10-CM | POA: Diagnosis not present

## 2022-12-01 DIAGNOSIS — K5900 Constipation, unspecified: Secondary | ICD-10-CM | POA: Diagnosis not present

## 2022-12-01 DIAGNOSIS — Z993 Dependence on wheelchair: Secondary | ICD-10-CM | POA: Diagnosis not present

## 2022-12-01 DIAGNOSIS — Z7952 Long term (current) use of systemic steroids: Secondary | ICD-10-CM | POA: Diagnosis not present

## 2022-12-01 DIAGNOSIS — S81802D Unspecified open wound, left lower leg, subsequent encounter: Secondary | ICD-10-CM | POA: Diagnosis not present

## 2022-12-01 DIAGNOSIS — D631 Anemia in chronic kidney disease: Secondary | ICD-10-CM | POA: Diagnosis not present

## 2022-12-01 DIAGNOSIS — E1122 Type 2 diabetes mellitus with diabetic chronic kidney disease: Secondary | ICD-10-CM | POA: Diagnosis not present

## 2022-12-01 DIAGNOSIS — G3184 Mild cognitive impairment, so stated: Secondary | ICD-10-CM | POA: Diagnosis not present

## 2022-12-01 DIAGNOSIS — G4700 Insomnia, unspecified: Secondary | ICD-10-CM | POA: Diagnosis not present

## 2022-12-01 DIAGNOSIS — Z7982 Long term (current) use of aspirin: Secondary | ICD-10-CM | POA: Diagnosis not present

## 2022-12-01 DIAGNOSIS — I129 Hypertensive chronic kidney disease with stage 1 through stage 4 chronic kidney disease, or unspecified chronic kidney disease: Secondary | ICD-10-CM | POA: Diagnosis not present

## 2022-12-01 DIAGNOSIS — E1151 Type 2 diabetes mellitus with diabetic peripheral angiopathy without gangrene: Secondary | ICD-10-CM | POA: Diagnosis not present

## 2022-12-01 DIAGNOSIS — J309 Allergic rhinitis, unspecified: Secondary | ICD-10-CM | POA: Diagnosis not present

## 2022-12-01 DIAGNOSIS — Z466 Encounter for fitting and adjustment of urinary device: Secondary | ICD-10-CM | POA: Diagnosis not present

## 2022-12-01 DIAGNOSIS — M069 Rheumatoid arthritis, unspecified: Secondary | ICD-10-CM | POA: Diagnosis not present

## 2022-12-01 DIAGNOSIS — E039 Hypothyroidism, unspecified: Secondary | ICD-10-CM | POA: Diagnosis not present

## 2022-12-01 DIAGNOSIS — E559 Vitamin D deficiency, unspecified: Secondary | ICD-10-CM | POA: Diagnosis not present

## 2022-12-01 DIAGNOSIS — G894 Chronic pain syndrome: Secondary | ICD-10-CM | POA: Diagnosis not present

## 2022-12-01 DIAGNOSIS — N1831 Chronic kidney disease, stage 3a: Secondary | ICD-10-CM | POA: Diagnosis not present

## 2022-12-02 DIAGNOSIS — C671 Malignant neoplasm of dome of bladder: Secondary | ICD-10-CM | POA: Diagnosis not present

## 2022-12-04 DIAGNOSIS — Z466 Encounter for fitting and adjustment of urinary device: Secondary | ICD-10-CM | POA: Diagnosis not present

## 2022-12-04 DIAGNOSIS — E039 Hypothyroidism, unspecified: Secondary | ICD-10-CM | POA: Diagnosis not present

## 2022-12-04 DIAGNOSIS — E785 Hyperlipidemia, unspecified: Secondary | ICD-10-CM | POA: Diagnosis not present

## 2022-12-04 DIAGNOSIS — Z7952 Long term (current) use of systemic steroids: Secondary | ICD-10-CM | POA: Diagnosis not present

## 2022-12-04 DIAGNOSIS — K5900 Constipation, unspecified: Secondary | ICD-10-CM | POA: Diagnosis not present

## 2022-12-04 DIAGNOSIS — G4700 Insomnia, unspecified: Secondary | ICD-10-CM | POA: Diagnosis not present

## 2022-12-04 DIAGNOSIS — E559 Vitamin D deficiency, unspecified: Secondary | ICD-10-CM | POA: Diagnosis not present

## 2022-12-04 DIAGNOSIS — E1122 Type 2 diabetes mellitus with diabetic chronic kidney disease: Secondary | ICD-10-CM | POA: Diagnosis not present

## 2022-12-04 DIAGNOSIS — G3184 Mild cognitive impairment, so stated: Secondary | ICD-10-CM | POA: Diagnosis not present

## 2022-12-04 DIAGNOSIS — J309 Allergic rhinitis, unspecified: Secondary | ICD-10-CM | POA: Diagnosis not present

## 2022-12-04 DIAGNOSIS — S81802D Unspecified open wound, left lower leg, subsequent encounter: Secondary | ICD-10-CM | POA: Diagnosis not present

## 2022-12-04 DIAGNOSIS — M069 Rheumatoid arthritis, unspecified: Secondary | ICD-10-CM | POA: Diagnosis not present

## 2022-12-04 DIAGNOSIS — Z993 Dependence on wheelchair: Secondary | ICD-10-CM | POA: Diagnosis not present

## 2022-12-04 DIAGNOSIS — D631 Anemia in chronic kidney disease: Secondary | ICD-10-CM | POA: Diagnosis not present

## 2022-12-04 DIAGNOSIS — G894 Chronic pain syndrome: Secondary | ICD-10-CM | POA: Diagnosis not present

## 2022-12-04 DIAGNOSIS — I129 Hypertensive chronic kidney disease with stage 1 through stage 4 chronic kidney disease, or unspecified chronic kidney disease: Secondary | ICD-10-CM | POA: Diagnosis not present

## 2022-12-04 DIAGNOSIS — M4800 Spinal stenosis, site unspecified: Secondary | ICD-10-CM | POA: Diagnosis not present

## 2022-12-04 DIAGNOSIS — N1831 Chronic kidney disease, stage 3a: Secondary | ICD-10-CM | POA: Diagnosis not present

## 2022-12-04 DIAGNOSIS — Z7982 Long term (current) use of aspirin: Secondary | ICD-10-CM | POA: Diagnosis not present

## 2022-12-04 DIAGNOSIS — E1151 Type 2 diabetes mellitus with diabetic peripheral angiopathy without gangrene: Secondary | ICD-10-CM | POA: Diagnosis not present

## 2022-12-15 DIAGNOSIS — E039 Hypothyroidism, unspecified: Secondary | ICD-10-CM | POA: Diagnosis not present

## 2022-12-15 DIAGNOSIS — J309 Allergic rhinitis, unspecified: Secondary | ICD-10-CM | POA: Diagnosis not present

## 2022-12-15 DIAGNOSIS — E785 Hyperlipidemia, unspecified: Secondary | ICD-10-CM | POA: Diagnosis not present

## 2022-12-15 DIAGNOSIS — Z7952 Long term (current) use of systemic steroids: Secondary | ICD-10-CM | POA: Diagnosis not present

## 2022-12-15 DIAGNOSIS — E559 Vitamin D deficiency, unspecified: Secondary | ICD-10-CM | POA: Diagnosis not present

## 2022-12-15 DIAGNOSIS — G4700 Insomnia, unspecified: Secondary | ICD-10-CM | POA: Diagnosis not present

## 2022-12-15 DIAGNOSIS — Z7982 Long term (current) use of aspirin: Secondary | ICD-10-CM | POA: Diagnosis not present

## 2022-12-15 DIAGNOSIS — M4800 Spinal stenosis, site unspecified: Secondary | ICD-10-CM | POA: Diagnosis not present

## 2022-12-15 DIAGNOSIS — M069 Rheumatoid arthritis, unspecified: Secondary | ICD-10-CM | POA: Diagnosis not present

## 2022-12-15 DIAGNOSIS — Z993 Dependence on wheelchair: Secondary | ICD-10-CM | POA: Diagnosis not present

## 2022-12-15 DIAGNOSIS — G3184 Mild cognitive impairment, so stated: Secondary | ICD-10-CM | POA: Diagnosis not present

## 2022-12-15 DIAGNOSIS — N1831 Chronic kidney disease, stage 3a: Secondary | ICD-10-CM | POA: Diagnosis not present

## 2022-12-15 DIAGNOSIS — G894 Chronic pain syndrome: Secondary | ICD-10-CM | POA: Diagnosis not present

## 2022-12-15 DIAGNOSIS — K5900 Constipation, unspecified: Secondary | ICD-10-CM | POA: Diagnosis not present

## 2022-12-15 DIAGNOSIS — Z466 Encounter for fitting and adjustment of urinary device: Secondary | ICD-10-CM | POA: Diagnosis not present

## 2022-12-15 DIAGNOSIS — S81802D Unspecified open wound, left lower leg, subsequent encounter: Secondary | ICD-10-CM | POA: Diagnosis not present

## 2022-12-15 DIAGNOSIS — D631 Anemia in chronic kidney disease: Secondary | ICD-10-CM | POA: Diagnosis not present

## 2022-12-15 DIAGNOSIS — E1122 Type 2 diabetes mellitus with diabetic chronic kidney disease: Secondary | ICD-10-CM | POA: Diagnosis not present

## 2022-12-15 DIAGNOSIS — E1151 Type 2 diabetes mellitus with diabetic peripheral angiopathy without gangrene: Secondary | ICD-10-CM | POA: Diagnosis not present

## 2022-12-15 DIAGNOSIS — I129 Hypertensive chronic kidney disease with stage 1 through stage 4 chronic kidney disease, or unspecified chronic kidney disease: Secondary | ICD-10-CM | POA: Diagnosis not present

## 2022-12-18 DIAGNOSIS — M4800 Spinal stenosis, site unspecified: Secondary | ICD-10-CM | POA: Diagnosis not present

## 2022-12-18 DIAGNOSIS — D631 Anemia in chronic kidney disease: Secondary | ICD-10-CM | POA: Diagnosis not present

## 2022-12-18 DIAGNOSIS — E559 Vitamin D deficiency, unspecified: Secondary | ICD-10-CM | POA: Diagnosis not present

## 2022-12-18 DIAGNOSIS — Z7952 Long term (current) use of systemic steroids: Secondary | ICD-10-CM | POA: Diagnosis not present

## 2022-12-18 DIAGNOSIS — M069 Rheumatoid arthritis, unspecified: Secondary | ICD-10-CM | POA: Diagnosis not present

## 2022-12-18 DIAGNOSIS — K5900 Constipation, unspecified: Secondary | ICD-10-CM | POA: Diagnosis not present

## 2022-12-18 DIAGNOSIS — S81802D Unspecified open wound, left lower leg, subsequent encounter: Secondary | ICD-10-CM | POA: Diagnosis not present

## 2022-12-18 DIAGNOSIS — I129 Hypertensive chronic kidney disease with stage 1 through stage 4 chronic kidney disease, or unspecified chronic kidney disease: Secondary | ICD-10-CM | POA: Diagnosis not present

## 2022-12-18 DIAGNOSIS — Z993 Dependence on wheelchair: Secondary | ICD-10-CM | POA: Diagnosis not present

## 2022-12-18 DIAGNOSIS — E039 Hypothyroidism, unspecified: Secondary | ICD-10-CM | POA: Diagnosis not present

## 2022-12-18 DIAGNOSIS — E1122 Type 2 diabetes mellitus with diabetic chronic kidney disease: Secondary | ICD-10-CM | POA: Diagnosis not present

## 2022-12-18 DIAGNOSIS — G3184 Mild cognitive impairment, so stated: Secondary | ICD-10-CM | POA: Diagnosis not present

## 2022-12-18 DIAGNOSIS — J309 Allergic rhinitis, unspecified: Secondary | ICD-10-CM | POA: Diagnosis not present

## 2022-12-18 DIAGNOSIS — G4700 Insomnia, unspecified: Secondary | ICD-10-CM | POA: Diagnosis not present

## 2022-12-18 DIAGNOSIS — Z466 Encounter for fitting and adjustment of urinary device: Secondary | ICD-10-CM | POA: Diagnosis not present

## 2022-12-18 DIAGNOSIS — E1151 Type 2 diabetes mellitus with diabetic peripheral angiopathy without gangrene: Secondary | ICD-10-CM | POA: Diagnosis not present

## 2022-12-18 DIAGNOSIS — Z7982 Long term (current) use of aspirin: Secondary | ICD-10-CM | POA: Diagnosis not present

## 2022-12-18 DIAGNOSIS — N1831 Chronic kidney disease, stage 3a: Secondary | ICD-10-CM | POA: Diagnosis not present

## 2022-12-18 DIAGNOSIS — G894 Chronic pain syndrome: Secondary | ICD-10-CM | POA: Diagnosis not present

## 2022-12-18 DIAGNOSIS — E785 Hyperlipidemia, unspecified: Secondary | ICD-10-CM | POA: Diagnosis not present

## 2022-12-19 DIAGNOSIS — M069 Rheumatoid arthritis, unspecified: Secondary | ICD-10-CM | POA: Diagnosis not present

## 2022-12-19 DIAGNOSIS — Z7982 Long term (current) use of aspirin: Secondary | ICD-10-CM | POA: Diagnosis not present

## 2022-12-19 DIAGNOSIS — E785 Hyperlipidemia, unspecified: Secondary | ICD-10-CM | POA: Diagnosis not present

## 2022-12-19 DIAGNOSIS — J309 Allergic rhinitis, unspecified: Secondary | ICD-10-CM | POA: Diagnosis not present

## 2022-12-19 DIAGNOSIS — I129 Hypertensive chronic kidney disease with stage 1 through stage 4 chronic kidney disease, or unspecified chronic kidney disease: Secondary | ICD-10-CM | POA: Diagnosis not present

## 2022-12-19 DIAGNOSIS — D631 Anemia in chronic kidney disease: Secondary | ICD-10-CM | POA: Diagnosis not present

## 2022-12-19 DIAGNOSIS — G3184 Mild cognitive impairment, so stated: Secondary | ICD-10-CM | POA: Diagnosis not present

## 2022-12-19 DIAGNOSIS — G4700 Insomnia, unspecified: Secondary | ICD-10-CM | POA: Diagnosis not present

## 2022-12-19 DIAGNOSIS — Z466 Encounter for fitting and adjustment of urinary device: Secondary | ICD-10-CM | POA: Diagnosis not present

## 2022-12-19 DIAGNOSIS — Z993 Dependence on wheelchair: Secondary | ICD-10-CM | POA: Diagnosis not present

## 2022-12-19 DIAGNOSIS — N1831 Chronic kidney disease, stage 3a: Secondary | ICD-10-CM | POA: Diagnosis not present

## 2022-12-19 DIAGNOSIS — E1122 Type 2 diabetes mellitus with diabetic chronic kidney disease: Secondary | ICD-10-CM | POA: Diagnosis not present

## 2022-12-19 DIAGNOSIS — E1151 Type 2 diabetes mellitus with diabetic peripheral angiopathy without gangrene: Secondary | ICD-10-CM | POA: Diagnosis not present

## 2022-12-19 DIAGNOSIS — S81802D Unspecified open wound, left lower leg, subsequent encounter: Secondary | ICD-10-CM | POA: Diagnosis not present

## 2022-12-19 DIAGNOSIS — G894 Chronic pain syndrome: Secondary | ICD-10-CM | POA: Diagnosis not present

## 2022-12-19 DIAGNOSIS — M4800 Spinal stenosis, site unspecified: Secondary | ICD-10-CM | POA: Diagnosis not present

## 2022-12-19 DIAGNOSIS — K5900 Constipation, unspecified: Secondary | ICD-10-CM | POA: Diagnosis not present

## 2022-12-19 DIAGNOSIS — E039 Hypothyroidism, unspecified: Secondary | ICD-10-CM | POA: Diagnosis not present

## 2022-12-19 DIAGNOSIS — E559 Vitamin D deficiency, unspecified: Secondary | ICD-10-CM | POA: Diagnosis not present

## 2022-12-19 DIAGNOSIS — Z7952 Long term (current) use of systemic steroids: Secondary | ICD-10-CM | POA: Diagnosis not present

## 2022-12-23 DIAGNOSIS — E559 Vitamin D deficiency, unspecified: Secondary | ICD-10-CM | POA: Diagnosis not present

## 2022-12-23 DIAGNOSIS — Z7952 Long term (current) use of systemic steroids: Secondary | ICD-10-CM | POA: Diagnosis not present

## 2022-12-23 DIAGNOSIS — Z7982 Long term (current) use of aspirin: Secondary | ICD-10-CM | POA: Diagnosis not present

## 2022-12-23 DIAGNOSIS — G894 Chronic pain syndrome: Secondary | ICD-10-CM | POA: Diagnosis not present

## 2022-12-23 DIAGNOSIS — I129 Hypertensive chronic kidney disease with stage 1 through stage 4 chronic kidney disease, or unspecified chronic kidney disease: Secondary | ICD-10-CM | POA: Diagnosis not present

## 2022-12-23 DIAGNOSIS — N1831 Chronic kidney disease, stage 3a: Secondary | ICD-10-CM | POA: Diagnosis not present

## 2022-12-23 DIAGNOSIS — M4800 Spinal stenosis, site unspecified: Secondary | ICD-10-CM | POA: Diagnosis not present

## 2022-12-23 DIAGNOSIS — G3184 Mild cognitive impairment, so stated: Secondary | ICD-10-CM | POA: Diagnosis not present

## 2022-12-23 DIAGNOSIS — M069 Rheumatoid arthritis, unspecified: Secondary | ICD-10-CM | POA: Diagnosis not present

## 2022-12-23 DIAGNOSIS — Z993 Dependence on wheelchair: Secondary | ICD-10-CM | POA: Diagnosis not present

## 2022-12-23 DIAGNOSIS — G4700 Insomnia, unspecified: Secondary | ICD-10-CM | POA: Diagnosis not present

## 2022-12-23 DIAGNOSIS — E1122 Type 2 diabetes mellitus with diabetic chronic kidney disease: Secondary | ICD-10-CM | POA: Diagnosis not present

## 2022-12-23 DIAGNOSIS — K5900 Constipation, unspecified: Secondary | ICD-10-CM | POA: Diagnosis not present

## 2022-12-23 DIAGNOSIS — D631 Anemia in chronic kidney disease: Secondary | ICD-10-CM | POA: Diagnosis not present

## 2022-12-23 DIAGNOSIS — S81802D Unspecified open wound, left lower leg, subsequent encounter: Secondary | ICD-10-CM | POA: Diagnosis not present

## 2022-12-23 DIAGNOSIS — E1151 Type 2 diabetes mellitus with diabetic peripheral angiopathy without gangrene: Secondary | ICD-10-CM | POA: Diagnosis not present

## 2022-12-23 DIAGNOSIS — E785 Hyperlipidemia, unspecified: Secondary | ICD-10-CM | POA: Diagnosis not present

## 2022-12-23 DIAGNOSIS — Z466 Encounter for fitting and adjustment of urinary device: Secondary | ICD-10-CM | POA: Diagnosis not present

## 2022-12-23 DIAGNOSIS — E039 Hypothyroidism, unspecified: Secondary | ICD-10-CM | POA: Diagnosis not present

## 2022-12-23 DIAGNOSIS — J309 Allergic rhinitis, unspecified: Secondary | ICD-10-CM | POA: Diagnosis not present

## 2022-12-24 DIAGNOSIS — N1832 Chronic kidney disease, stage 3b: Secondary | ICD-10-CM | POA: Diagnosis not present

## 2022-12-24 DIAGNOSIS — I129 Hypertensive chronic kidney disease with stage 1 through stage 4 chronic kidney disease, or unspecified chronic kidney disease: Secondary | ICD-10-CM | POA: Diagnosis not present

## 2022-12-24 DIAGNOSIS — K5900 Constipation, unspecified: Secondary | ICD-10-CM | POA: Diagnosis not present

## 2022-12-26 DIAGNOSIS — G3184 Mild cognitive impairment, so stated: Secondary | ICD-10-CM | POA: Diagnosis not present

## 2022-12-29 DIAGNOSIS — N1831 Chronic kidney disease, stage 3a: Secondary | ICD-10-CM | POA: Diagnosis not present

## 2022-12-29 DIAGNOSIS — E1122 Type 2 diabetes mellitus with diabetic chronic kidney disease: Secondary | ICD-10-CM | POA: Diagnosis not present

## 2022-12-29 DIAGNOSIS — G894 Chronic pain syndrome: Secondary | ICD-10-CM | POA: Diagnosis not present

## 2022-12-29 DIAGNOSIS — S81802D Unspecified open wound, left lower leg, subsequent encounter: Secondary | ICD-10-CM | POA: Diagnosis not present

## 2022-12-29 DIAGNOSIS — E559 Vitamin D deficiency, unspecified: Secondary | ICD-10-CM | POA: Diagnosis not present

## 2022-12-29 DIAGNOSIS — Z7982 Long term (current) use of aspirin: Secondary | ICD-10-CM | POA: Diagnosis not present

## 2022-12-29 DIAGNOSIS — E785 Hyperlipidemia, unspecified: Secondary | ICD-10-CM | POA: Diagnosis not present

## 2022-12-29 DIAGNOSIS — E1151 Type 2 diabetes mellitus with diabetic peripheral angiopathy without gangrene: Secondary | ICD-10-CM | POA: Diagnosis not present

## 2022-12-29 DIAGNOSIS — I129 Hypertensive chronic kidney disease with stage 1 through stage 4 chronic kidney disease, or unspecified chronic kidney disease: Secondary | ICD-10-CM | POA: Diagnosis not present

## 2022-12-29 DIAGNOSIS — D631 Anemia in chronic kidney disease: Secondary | ICD-10-CM | POA: Diagnosis not present

## 2022-12-29 DIAGNOSIS — M4800 Spinal stenosis, site unspecified: Secondary | ICD-10-CM | POA: Diagnosis not present

## 2022-12-29 DIAGNOSIS — Z7952 Long term (current) use of systemic steroids: Secondary | ICD-10-CM | POA: Diagnosis not present

## 2022-12-29 DIAGNOSIS — Z993 Dependence on wheelchair: Secondary | ICD-10-CM | POA: Diagnosis not present

## 2022-12-29 DIAGNOSIS — M069 Rheumatoid arthritis, unspecified: Secondary | ICD-10-CM | POA: Diagnosis not present

## 2022-12-29 DIAGNOSIS — E039 Hypothyroidism, unspecified: Secondary | ICD-10-CM | POA: Diagnosis not present

## 2022-12-29 DIAGNOSIS — J309 Allergic rhinitis, unspecified: Secondary | ICD-10-CM | POA: Diagnosis not present

## 2022-12-29 DIAGNOSIS — K5900 Constipation, unspecified: Secondary | ICD-10-CM | POA: Diagnosis not present

## 2022-12-29 DIAGNOSIS — Z466 Encounter for fitting and adjustment of urinary device: Secondary | ICD-10-CM | POA: Diagnosis not present

## 2022-12-29 DIAGNOSIS — G3184 Mild cognitive impairment, so stated: Secondary | ICD-10-CM | POA: Diagnosis not present

## 2022-12-29 DIAGNOSIS — G4700 Insomnia, unspecified: Secondary | ICD-10-CM | POA: Diagnosis not present

## 2022-12-31 DIAGNOSIS — M069 Rheumatoid arthritis, unspecified: Secondary | ICD-10-CM | POA: Diagnosis not present

## 2022-12-31 DIAGNOSIS — E785 Hyperlipidemia, unspecified: Secondary | ICD-10-CM | POA: Diagnosis not present

## 2022-12-31 DIAGNOSIS — S81802D Unspecified open wound, left lower leg, subsequent encounter: Secondary | ICD-10-CM | POA: Diagnosis not present

## 2022-12-31 DIAGNOSIS — G3184 Mild cognitive impairment, so stated: Secondary | ICD-10-CM | POA: Diagnosis not present

## 2022-12-31 DIAGNOSIS — I129 Hypertensive chronic kidney disease with stage 1 through stage 4 chronic kidney disease, or unspecified chronic kidney disease: Secondary | ICD-10-CM | POA: Diagnosis not present

## 2022-12-31 DIAGNOSIS — Z7982 Long term (current) use of aspirin: Secondary | ICD-10-CM | POA: Diagnosis not present

## 2022-12-31 DIAGNOSIS — N1831 Chronic kidney disease, stage 3a: Secondary | ICD-10-CM | POA: Diagnosis not present

## 2022-12-31 DIAGNOSIS — Z7952 Long term (current) use of systemic steroids: Secondary | ICD-10-CM | POA: Diagnosis not present

## 2022-12-31 DIAGNOSIS — E039 Hypothyroidism, unspecified: Secondary | ICD-10-CM | POA: Diagnosis not present

## 2022-12-31 DIAGNOSIS — K5900 Constipation, unspecified: Secondary | ICD-10-CM | POA: Diagnosis not present

## 2022-12-31 DIAGNOSIS — G4700 Insomnia, unspecified: Secondary | ICD-10-CM | POA: Diagnosis not present

## 2022-12-31 DIAGNOSIS — J309 Allergic rhinitis, unspecified: Secondary | ICD-10-CM | POA: Diagnosis not present

## 2022-12-31 DIAGNOSIS — M4800 Spinal stenosis, site unspecified: Secondary | ICD-10-CM | POA: Diagnosis not present

## 2022-12-31 DIAGNOSIS — E559 Vitamin D deficiency, unspecified: Secondary | ICD-10-CM | POA: Diagnosis not present

## 2022-12-31 DIAGNOSIS — E1122 Type 2 diabetes mellitus with diabetic chronic kidney disease: Secondary | ICD-10-CM | POA: Diagnosis not present

## 2022-12-31 DIAGNOSIS — Z466 Encounter for fitting and adjustment of urinary device: Secondary | ICD-10-CM | POA: Diagnosis not present

## 2022-12-31 DIAGNOSIS — G894 Chronic pain syndrome: Secondary | ICD-10-CM | POA: Diagnosis not present

## 2022-12-31 DIAGNOSIS — Z993 Dependence on wheelchair: Secondary | ICD-10-CM | POA: Diagnosis not present

## 2022-12-31 DIAGNOSIS — D631 Anemia in chronic kidney disease: Secondary | ICD-10-CM | POA: Diagnosis not present

## 2022-12-31 DIAGNOSIS — E1151 Type 2 diabetes mellitus with diabetic peripheral angiopathy without gangrene: Secondary | ICD-10-CM | POA: Diagnosis not present

## 2023-01-02 ENCOUNTER — Encounter (HOSPITAL_COMMUNITY): Payer: Self-pay

## 2023-01-02 ENCOUNTER — Other Ambulatory Visit: Payer: Self-pay

## 2023-01-02 ENCOUNTER — Emergency Department (HOSPITAL_COMMUNITY)
Admission: EM | Admit: 2023-01-02 | Discharge: 2023-01-02 | Disposition: A | Discharge status: Other Acute Inpatient Hospital | Payer: Medicare Other | Attending: Emergency Medicine | Admitting: Emergency Medicine

## 2023-01-02 ENCOUNTER — Emergency Department (HOSPITAL_COMMUNITY): Payer: Medicare Other

## 2023-01-02 DIAGNOSIS — R0781 Pleurodynia: Secondary | ICD-10-CM | POA: Diagnosis not present

## 2023-01-02 DIAGNOSIS — E049 Nontoxic goiter, unspecified: Secondary | ICD-10-CM | POA: Diagnosis not present

## 2023-01-02 DIAGNOSIS — Y92129 Unspecified place in nursing home as the place of occurrence of the external cause: Secondary | ICD-10-CM | POA: Diagnosis not present

## 2023-01-02 DIAGNOSIS — R0789 Other chest pain: Secondary | ICD-10-CM | POA: Diagnosis not present

## 2023-01-02 DIAGNOSIS — Z743 Need for continuous supervision: Secondary | ICD-10-CM | POA: Diagnosis not present

## 2023-01-02 DIAGNOSIS — S0990XA Unspecified injury of head, initial encounter: Secondary | ICD-10-CM

## 2023-01-02 DIAGNOSIS — R5383 Other fatigue: Secondary | ICD-10-CM | POA: Diagnosis not present

## 2023-01-02 DIAGNOSIS — R531 Weakness: Secondary | ICD-10-CM | POA: Diagnosis not present

## 2023-01-02 DIAGNOSIS — J45909 Unspecified asthma, uncomplicated: Secondary | ICD-10-CM | POA: Diagnosis not present

## 2023-01-02 DIAGNOSIS — R079 Chest pain, unspecified: Secondary | ICD-10-CM | POA: Diagnosis not present

## 2023-01-02 DIAGNOSIS — Z85528 Personal history of other malignant neoplasm of kidney: Secondary | ICD-10-CM | POA: Diagnosis not present

## 2023-01-02 DIAGNOSIS — J9811 Atelectasis: Secondary | ICD-10-CM | POA: Diagnosis not present

## 2023-01-02 DIAGNOSIS — S161XXA Strain of muscle, fascia and tendon at neck level, initial encounter: Secondary | ICD-10-CM | POA: Insufficient documentation

## 2023-01-02 DIAGNOSIS — W19XXXA Unspecified fall, initial encounter: Secondary | ICD-10-CM

## 2023-01-02 DIAGNOSIS — E119 Type 2 diabetes mellitus without complications: Secondary | ICD-10-CM | POA: Insufficient documentation

## 2023-01-02 DIAGNOSIS — I6523 Occlusion and stenosis of bilateral carotid arteries: Secondary | ICD-10-CM | POA: Diagnosis not present

## 2023-01-02 DIAGNOSIS — S20219A Contusion of unspecified front wall of thorax, initial encounter: Secondary | ICD-10-CM | POA: Diagnosis not present

## 2023-01-02 DIAGNOSIS — I10 Essential (primary) hypertension: Secondary | ICD-10-CM | POA: Insufficient documentation

## 2023-01-02 DIAGNOSIS — W050XXA Fall from non-moving wheelchair, initial encounter: Secondary | ICD-10-CM | POA: Insufficient documentation

## 2023-01-02 DIAGNOSIS — Z7401 Bed confinement status: Secondary | ICD-10-CM | POA: Diagnosis not present

## 2023-01-02 DIAGNOSIS — M542 Cervicalgia: Secondary | ICD-10-CM | POA: Diagnosis not present

## 2023-01-02 DIAGNOSIS — Z043 Encounter for examination and observation following other accident: Secondary | ICD-10-CM | POA: Diagnosis not present

## 2023-01-02 DIAGNOSIS — R6889 Other general symptoms and signs: Secondary | ICD-10-CM | POA: Diagnosis not present

## 2023-01-02 DIAGNOSIS — S0083XA Contusion of other part of head, initial encounter: Secondary | ICD-10-CM | POA: Insufficient documentation

## 2023-01-02 DIAGNOSIS — E039 Hypothyroidism, unspecified: Secondary | ICD-10-CM | POA: Insufficient documentation

## 2023-01-02 DIAGNOSIS — S199XXA Unspecified injury of neck, initial encounter: Secondary | ICD-10-CM | POA: Diagnosis present

## 2023-01-02 DIAGNOSIS — G319 Degenerative disease of nervous system, unspecified: Secondary | ICD-10-CM | POA: Diagnosis not present

## 2023-01-02 MED ORDER — ACETAMINOPHEN 500 MG PO TABS
1000.0000 mg | ORAL_TABLET | Freq: Once | ORAL | Status: AC
  Administered 2023-01-02: 1000 mg via ORAL
  Filled 2023-01-02: qty 2

## 2023-01-02 NOTE — Discharge Instructions (Signed)
You were seen in the emergency room today after a fall.  Your CT scans and x-rays did not show any acute findings that require further testing or treatment.  You may take Tylenol or topical medications as needed for pain.  Your thyroid gland is slightly enlarged and the radiologist is recommending a nonemergent outpatient ultrasound of your thyroid for reassessment of this.  Your primary care doctor can help to schedule this. Please call to make them aware of your ED visit and schedule the next available follow up appointment.

## 2023-01-02 NOTE — ED Triage Notes (Signed)
Pt had unwitnessed fall from her wheelchair yesterday morning approx. 0500 at her facility [facility]. Per EMS staff concerned today d/t pt complaining of bilateral neck and rib soreness, hematoma to forehead, "more weak than normal and more pale than her normal." Pt reports headache, bilateral neck and rib soreness, and pain in BLE. Pt denies LOC. Pt does take daily ASA. Dr. Jacqulyn Bath notified.

## 2023-01-02 NOTE — ED Provider Notes (Signed)
Emergency Department Provider Note   I have reviewed the triage vital signs and the nursing notes.   HISTORY  Chief Complaint Fall, Head Injury, Chest Pain (Rib pain bilaterally/), and Torticollis   HPI [name] is a 77 y.o. female with PMH reviewed below presents to the emergency department with pain and bruising after a fall.  The fall occurred greater than 24 hours ago.  She is here from her skilled nursing facility.  Staff report that she fell to the ground from her wheelchair at around 5 AM yesterday.  She has developed some bruising to her forehead and has been complaining of some neck pain and rib soreness along with some associated fatigue.  She is not anticoagulated. No new medications. No fever. Staff called for ED evaluation with worsening symptoms.    Past Medical History:  Diagnosis Date   Anemia    Anxiety    Arthritis    rhematoid,osteoarthritis   Asthma    mild   Cancer (HCC)    kidney   Diabetes mellitus without complication (HCC)    Dyspnea    increased exertion   Elevated cholesterol    GERD (gastroesophageal reflux disease)    Hemorrhoids    Hypertension    Hypothyroidism    Joint pain    Nocturia    Pneumonia    Sleeping difficulties     Review of Systems  Constitutional: No fever/chills Cardiovascular: Denies chest pain. Respiratory: Denies shortness of breath. Gastrointestinal: No abdominal pain.  Genitourinary: Negative for dysuria. Musculoskeletal: Positive rib pain and neck pain.  Skin: Negative for rash. Neurological: Positive HA.    ____________________________________________   PHYSICAL EXAM:  VITAL SIGNS: ED Triage Vitals  Enc Vitals Group     BP 01/02/23 0205 103/83     Pulse Rate 01/02/23 0205 88     Resp 01/02/23 0205 18     Temp 01/02/23 0205 98.2 F (36.8 C)     Temp Source 01/02/23 0205 Oral     SpO2 01/02/23 0205 100 %     Weight 01/02/23 0205 170 lb (77.1 kg)     Height 01/02/23 0205 5\' 4"  (1.626  m)   Constitutional: Alert and oriented. Well appearing and in no acute distress. Eyes: Conjunctivae are normal.  Head: Atraumatic. Nose: No congestion/rhinnorhea. Mouth/Throat: Mucous membranes are moist.   Neck: No stridor.   Cardiovascular: Normal rate, regular rhythm. Good peripheral circulation. Grossly normal heart sounds.   Respiratory: Normal respiratory effort.  Gastrointestinal: Soft and nontender. No distention.  Musculoskeletal: No lower extremity tenderness nor edema. No gross deformities of extremities. Neurologic:  Normal speech and language.  Skin:  Skin is warm and dry.  Venous stasis dermatitis with shallow ulcerations. No cellulitis or abscess.  ____________________________________________  RADIOLOGY  CT Head Wo Contrast  Result Date: 01/02/2023 CLINICAL DATA:  Fall EXAM: CT HEAD WITHOUT CONTRAST CT MAXILLOFACIAL WITHOUT CONTRAST CT CERVICAL SPINE WITHOUT CONTRAST TECHNIQUE: Multidetector CT imaging of the head, cervical spine, and maxillofacial structures were performed using the standard protocol without intravenous contrast. Multiplanar CT image reconstructions of the cervical spine and maxillofacial structures were also generated. RADIATION DOSE REDUCTION: This exam was performed according to the departmental dose-optimization program which includes automated exposure control, adjustment of the mA and/or kV according to patient size and/or use of iterative reconstruction technique. COMPARISON:  None Available. FINDINGS: CT HEAD FINDINGS Brain: There is no mass, hemorrhage or extra-axial collection. There is generalized atrophy without lobar predilection. There is hypoattenuation of  the periventricular white matter, most commonly indicating chronic ischemic microangiopathy. Vascular: Atherosclerotic calcification of the internal carotid arteries at the skull base. No abnormal hyperdensity of the major intracranial arteries or dural venous sinuses. Skull: The visualized skull  base, calvarium and extracranial soft tissues are normal. CT MAXILLOFACIAL FINDINGS Osseous: No facial fracture or mandibular dislocation. Orbits: The globes are intact. Normal appearance of the intra- and extraconal fat. Symmetric extraocular muscles and optic nerves. Sinuses: No fluid levels or advanced mucosal thickening. Soft tissues: Normal visualized extracranial soft tissues. CT CERVICAL SPINE FINDINGS Alignment: No static subluxation. Facets are aligned. Occipital condyles and the lateral masses of C1-C2 are aligned. Skull base and vertebrae: No acute fracture.  C3-7 anterior fusion Soft tissues and spinal canal: No prevertebral fluid or swelling. No visible canal hematoma. Disc levels: No advanced spinal canal or neural foraminal stenosis. Upper chest: No pneumothorax, pulmonary nodule or pleural effusion. Other: Heterogeneous and enlarged right lobe of thyroid gland. IMPRESSION: 1. No acute intracranial abnormality. 2. No acute fracture or static subluxation of the cervical spine. 3. No facial fracture. 4. Heterogeneous and enlarged right lobe of the thyroid gland. Nonemergent thyroid ultrasound is recommended. Electronically Signed   By: Deatra Robinson M.D.   On: 01/02/2023 03:36   CT Cervical Spine Wo Contrast  Result Date: 01/02/2023 CLINICAL DATA:  Fall EXAM: CT HEAD WITHOUT CONTRAST CT MAXILLOFACIAL WITHOUT CONTRAST CT CERVICAL SPINE WITHOUT CONTRAST TECHNIQUE: Multidetector CT imaging of the head, cervical spine, and maxillofacial structures were performed using the standard protocol without intravenous contrast. Multiplanar CT image reconstructions of the cervical spine and maxillofacial structures were also generated. RADIATION DOSE REDUCTION: This exam was performed according to the departmental dose-optimization program which includes automated exposure control, adjustment of the mA and/or kV according to patient size and/or use of iterative reconstruction technique. COMPARISON:  None  Available. FINDINGS: CT HEAD FINDINGS Brain: There is no mass, hemorrhage or extra-axial collection. There is generalized atrophy without lobar predilection. There is hypoattenuation of the periventricular white matter, most commonly indicating chronic ischemic microangiopathy. Vascular: Atherosclerotic calcification of the internal carotid arteries at the skull base. No abnormal hyperdensity of the major intracranial arteries or dural venous sinuses. Skull: The visualized skull base, calvarium and extracranial soft tissues are normal. CT MAXILLOFACIAL FINDINGS Osseous: No facial fracture or mandibular dislocation. Orbits: The globes are intact. Normal appearance of the intra- and extraconal fat. Symmetric extraocular muscles and optic nerves. Sinuses: No fluid levels or advanced mucosal thickening. Soft tissues: Normal visualized extracranial soft tissues. CT CERVICAL SPINE FINDINGS Alignment: No static subluxation. Facets are aligned. Occipital condyles and the lateral masses of C1-C2 are aligned. Skull base and vertebrae: No acute fracture.  C3-7 anterior fusion Soft tissues and spinal canal: No prevertebral fluid or swelling. No visible canal hematoma. Disc levels: No advanced spinal canal or neural foraminal stenosis. Upper chest: No pneumothorax, pulmonary nodule or pleural effusion. Other: Heterogeneous and enlarged right lobe of thyroid gland. IMPRESSION: 1. No acute intracranial abnormality. 2. No acute fracture or static subluxation of the cervical spine. 3. No facial fracture. 4. Heterogeneous and enlarged right lobe of the thyroid gland. Nonemergent thyroid ultrasound is recommended. Electronically Signed   By: Deatra Robinson M.D.   On: 01/02/2023 03:36   CT Maxillofacial Wo Contrast  Result Date: 01/02/2023 CLINICAL DATA:  Fall EXAM: CT HEAD WITHOUT CONTRAST CT MAXILLOFACIAL WITHOUT CONTRAST CT CERVICAL SPINE WITHOUT CONTRAST TECHNIQUE: Multidetector CT imaging of the head, cervical spine, and  maxillofacial structures were performed using  the standard protocol without intravenous contrast. Multiplanar CT image reconstructions of the cervical spine and maxillofacial structures were also generated. RADIATION DOSE REDUCTION: This exam was performed according to the departmental dose-optimization program which includes automated exposure control, adjustment of the mA and/or kV according to patient size and/or use of iterative reconstruction technique. COMPARISON:  None Available. FINDINGS: CT HEAD FINDINGS Brain: There is no mass, hemorrhage or extra-axial collection. There is generalized atrophy without lobar predilection. There is hypoattenuation of the periventricular white matter, most commonly indicating chronic ischemic microangiopathy. Vascular: Atherosclerotic calcification of the internal carotid arteries at the skull base. No abnormal hyperdensity of the major intracranial arteries or dural venous sinuses. Skull: The visualized skull base, calvarium and extracranial soft tissues are normal. CT MAXILLOFACIAL FINDINGS Osseous: No facial fracture or mandibular dislocation. Orbits: The globes are intact. Normal appearance of the intra- and extraconal fat. Symmetric extraocular muscles and optic nerves. Sinuses: No fluid levels or advanced mucosal thickening. Soft tissues: Normal visualized extracranial soft tissues. CT CERVICAL SPINE FINDINGS Alignment: No static subluxation. Facets are aligned. Occipital condyles and the lateral masses of C1-C2 are aligned. Skull base and vertebrae: No acute fracture.  C3-7 anterior fusion Soft tissues and spinal canal: No prevertebral fluid or swelling. No visible canal hematoma. Disc levels: No advanced spinal canal or neural foraminal stenosis. Upper chest: No pneumothorax, pulmonary nodule or pleural effusion. Other: Heterogeneous and enlarged right lobe of thyroid gland. IMPRESSION: 1. No acute intracranial abnormality. 2. No acute fracture or static subluxation  of the cervical spine. 3. No facial fracture. 4. Heterogeneous and enlarged right lobe of the thyroid gland. Nonemergent thyroid ultrasound is recommended. Electronically Signed   By: Deatra Robinson M.D.   On: 01/02/2023 03:36   DG Pelvis 1-2 Views  Result Date: 01/02/2023 CLINICAL DATA:  Recent fall with pelvic pain, initial encounter EXAM: PELVIS - 1-VIEW COMPARISON:  5/21/\24 FINDINGS: Pelvic ring is intact. No acute fracture or dislocation is noted. Postsurgical changes are noted the lumbar spine. IMPRESSION: No acute abnormality noted. Electronically Signed   By: Alcide Clever M.D.   On: 01/02/2023 03:24   DG Chest 2 View  Result Date: 01/02/2023 CLINICAL DATA:  Unwitnessed fall with chest pain, initial encounter EXAM: CHEST - 2 VIEW COMPARISON:  11/10/2022 FINDINGS: Cardiac shadow is within normal limits. Bibasilar atelectatic changes are noted. Postsurgical changes are seen IMPRESSION: Mild bibasilar atelectasis. Electronically Signed   By: Alcide Clever M.D.   On: 01/02/2023 03:22    ____________________________________________   PROCEDURES  Procedure(s) performed:   Procedures  None ____________________________________________   INITIAL IMPRESSION / ASSESSMENT AND PLAN / ED COURSE  Pertinent labs & imaging results that were available during my care of the patient were reviewed by me and considered in my medical decision making (see chart for details).   This patient is Presenting for Evaluation of fall, which does require a range of treatment options, and is a complaint that involves a high risk of morbidity and mortality.  The Differential Diagnoses  includes subdural hematoma, epidural hematoma, acute concussion, traumatic subarachnoid hemorrhage, cerebral contusions, etc.  Radiologic Tests Ordered, included CT head, c spine, face, and CXR along with pelvis XR. I independently interpreted the images and agree with radiology interpretation.   Medical Decision Making: Summary:   Patient presents to the emergency department for evaluation after mechanical fall yesterday morning from the wheelchair.  She is awake, alert.  Vital signs are within normal limits.  She has venous stasis dermatitis in the  lower extremities.  I did remove her leg wrappings to investigate for possible underlying infection but do not appreciate this.  Plan for trauma imaging and reassess.  Reevaluation with update and discussion with patient. Discussed imaging results and incidental thyroid finding. Plan for outpatient follow up.    Considered admission but no acute traumatic findings to prompt admit.   Patient's presentation is most consistent with acute presentation with potential threat to life or bodily function.   Disposition: discharge  ____________________________________________  FINAL CLINICAL IMPRESSION(S) / ED DIAGNOSES  Final diagnoses:  Injury of head, initial encounter  Contusion of face, initial encounter  Enlarged thyroid gland  Strain of neck muscle, initial encounter  Chest wall pain  Fall, initial encounter     Note:  This document was prepared using Dragon voice recognition software and may include unintentional dictation errors.  Alona Bene, MD, [facility]    Larance Ratledge, Arlyss Repress, MD 01/02/23 [phone]

## 2023-01-02 NOTE — ED Notes (Signed)
Non-emergent ambulance transport arranged for pt with [facility] at this time.

## 2023-01-05 DIAGNOSIS — Z466 Encounter for fitting and adjustment of urinary device: Secondary | ICD-10-CM | POA: Diagnosis not present

## 2023-01-05 DIAGNOSIS — E559 Vitamin D deficiency, unspecified: Secondary | ICD-10-CM | POA: Diagnosis not present

## 2023-01-05 DIAGNOSIS — I129 Hypertensive chronic kidney disease with stage 1 through stage 4 chronic kidney disease, or unspecified chronic kidney disease: Secondary | ICD-10-CM | POA: Diagnosis not present

## 2023-01-05 DIAGNOSIS — Z7982 Long term (current) use of aspirin: Secondary | ICD-10-CM | POA: Diagnosis not present

## 2023-01-05 DIAGNOSIS — Z7952 Long term (current) use of systemic steroids: Secondary | ICD-10-CM | POA: Diagnosis not present

## 2023-01-05 DIAGNOSIS — J309 Allergic rhinitis, unspecified: Secondary | ICD-10-CM | POA: Diagnosis not present

## 2023-01-05 DIAGNOSIS — D631 Anemia in chronic kidney disease: Secondary | ICD-10-CM | POA: Diagnosis not present

## 2023-01-05 DIAGNOSIS — Z993 Dependence on wheelchair: Secondary | ICD-10-CM | POA: Diagnosis not present

## 2023-01-05 DIAGNOSIS — M069 Rheumatoid arthritis, unspecified: Secondary | ICD-10-CM | POA: Diagnosis not present

## 2023-01-05 DIAGNOSIS — E1122 Type 2 diabetes mellitus with diabetic chronic kidney disease: Secondary | ICD-10-CM | POA: Diagnosis not present

## 2023-01-05 DIAGNOSIS — G3184 Mild cognitive impairment, so stated: Secondary | ICD-10-CM | POA: Diagnosis not present

## 2023-01-05 DIAGNOSIS — G894 Chronic pain syndrome: Secondary | ICD-10-CM | POA: Diagnosis not present

## 2023-01-05 DIAGNOSIS — N1831 Chronic kidney disease, stage 3a: Secondary | ICD-10-CM | POA: Diagnosis not present

## 2023-01-05 DIAGNOSIS — E785 Hyperlipidemia, unspecified: Secondary | ICD-10-CM | POA: Diagnosis not present

## 2023-01-05 DIAGNOSIS — S81802D Unspecified open wound, left lower leg, subsequent encounter: Secondary | ICD-10-CM | POA: Diagnosis not present

## 2023-01-05 DIAGNOSIS — G4700 Insomnia, unspecified: Secondary | ICD-10-CM | POA: Diagnosis not present

## 2023-01-05 DIAGNOSIS — M4800 Spinal stenosis, site unspecified: Secondary | ICD-10-CM | POA: Diagnosis not present

## 2023-01-05 DIAGNOSIS — E039 Hypothyroidism, unspecified: Secondary | ICD-10-CM | POA: Diagnosis not present

## 2023-01-05 DIAGNOSIS — K5900 Constipation, unspecified: Secondary | ICD-10-CM | POA: Diagnosis not present

## 2023-01-05 DIAGNOSIS — E1151 Type 2 diabetes mellitus with diabetic peripheral angiopathy without gangrene: Secondary | ICD-10-CM | POA: Diagnosis not present

## 2023-01-06 ENCOUNTER — Encounter (HOSPITAL_COMMUNITY): Payer: Self-pay

## 2023-01-06 ENCOUNTER — Emergency Department (HOSPITAL_COMMUNITY): Payer: Medicare Other

## 2023-01-06 ENCOUNTER — Emergency Department (HOSPITAL_COMMUNITY)
Admission: EM | Admit: 2023-01-06 | Discharge: 2023-01-06 | Disposition: A | Discharge status: Other Acute Inpatient Hospital | Payer: Medicare Other | Attending: Emergency Medicine | Admitting: Emergency Medicine

## 2023-01-06 ENCOUNTER — Other Ambulatory Visit: Payer: Self-pay

## 2023-01-06 DIAGNOSIS — Z743 Need for continuous supervision: Secondary | ICD-10-CM | POA: Diagnosis not present

## 2023-01-06 DIAGNOSIS — N390 Urinary tract infection, site not specified: Secondary | ICD-10-CM | POA: Diagnosis not present

## 2023-01-06 DIAGNOSIS — Z981 Arthrodesis status: Secondary | ICD-10-CM | POA: Diagnosis not present

## 2023-01-06 DIAGNOSIS — R109 Unspecified abdominal pain: Secondary | ICD-10-CM | POA: Diagnosis not present

## 2023-01-06 DIAGNOSIS — I700 Atherosclerosis of aorta: Secondary | ICD-10-CM | POA: Diagnosis not present

## 2023-01-06 DIAGNOSIS — R9082 White matter disease, unspecified: Secondary | ICD-10-CM | POA: Diagnosis not present

## 2023-01-06 DIAGNOSIS — W06XXXA Fall from bed, initial encounter: Secondary | ICD-10-CM | POA: Diagnosis not present

## 2023-01-06 DIAGNOSIS — W19XXXA Unspecified fall, initial encounter: Secondary | ICD-10-CM | POA: Diagnosis not present

## 2023-01-06 DIAGNOSIS — R220 Localized swelling, mass and lump, head: Secondary | ICD-10-CM | POA: Diagnosis not present

## 2023-01-06 DIAGNOSIS — S0083XA Contusion of other part of head, initial encounter: Secondary | ICD-10-CM | POA: Insufficient documentation

## 2023-01-06 DIAGNOSIS — J9811 Atelectasis: Secondary | ICD-10-CM | POA: Diagnosis not present

## 2023-01-06 DIAGNOSIS — S0990XA Unspecified injury of head, initial encounter: Secondary | ICD-10-CM | POA: Diagnosis present

## 2023-01-06 DIAGNOSIS — N3289 Other specified disorders of bladder: Secondary | ICD-10-CM | POA: Diagnosis not present

## 2023-01-06 DIAGNOSIS — R0989 Other specified symptoms and signs involving the circulatory and respiratory systems: Secondary | ICD-10-CM | POA: Diagnosis not present

## 2023-01-06 DIAGNOSIS — M542 Cervicalgia: Secondary | ICD-10-CM | POA: Diagnosis not present

## 2023-01-06 DIAGNOSIS — Z7982 Long term (current) use of aspirin: Secondary | ICD-10-CM | POA: Diagnosis not present

## 2023-01-06 DIAGNOSIS — M7989 Other specified soft tissue disorders: Secondary | ICD-10-CM | POA: Diagnosis not present

## 2023-01-06 DIAGNOSIS — Z905 Acquired absence of kidney: Secondary | ICD-10-CM | POA: Diagnosis not present

## 2023-01-06 DIAGNOSIS — R443 Hallucinations, unspecified: Secondary | ICD-10-CM | POA: Diagnosis not present

## 2023-01-06 DIAGNOSIS — S0093XA Contusion of unspecified part of head, initial encounter: Secondary | ICD-10-CM

## 2023-01-06 DIAGNOSIS — R6889 Other general symptoms and signs: Secondary | ICD-10-CM | POA: Diagnosis not present

## 2023-01-06 DIAGNOSIS — R519 Headache, unspecified: Secondary | ICD-10-CM | POA: Diagnosis not present

## 2023-01-06 DIAGNOSIS — S300XXA Contusion of lower back and pelvis, initial encounter: Secondary | ICD-10-CM | POA: Insufficient documentation

## 2023-01-06 DIAGNOSIS — S0003XA Contusion of scalp, initial encounter: Secondary | ICD-10-CM | POA: Diagnosis not present

## 2023-01-06 DIAGNOSIS — R410 Disorientation, unspecified: Secondary | ICD-10-CM | POA: Diagnosis not present

## 2023-01-06 DIAGNOSIS — R300 Dysuria: Secondary | ICD-10-CM | POA: Diagnosis not present

## 2023-01-06 LAB — COMPREHENSIVE METABOLIC PANEL
ALT: 34 U/L (ref 0–44)
AST: 30 U/L (ref 15–41)
Albumin: 3 g/dL — ABNORMAL LOW (ref 3.5–5.0)
Alkaline Phosphatase: 99 U/L (ref 38–126)
Anion gap: 10 (ref 5–15)
BUN: 20 mg/dL (ref 8–23)
CO2: 20 mmol/L — ABNORMAL LOW (ref 22–32)
Calcium: 7 mg/dL — ABNORMAL LOW (ref 8.9–10.3)
Chloride: 112 mmol/L — ABNORMAL HIGH (ref 98–111)
Creatinine, Ser: 1.12 mg/dL — ABNORMAL HIGH (ref 0.44–1.00)
GFR, Estimated: 51 mL/min — ABNORMAL LOW (ref >60)
Glucose, Bld: 83 mg/dL (ref 70–99)
Potassium: 3.5 mmol/L (ref 3.5–5.1)
Sodium: 142 mmol/L (ref 135–145)
Total Bilirubin: 0.4 mg/dL (ref 0.3–1.2)
Total Protein: 5.7 g/dL — ABNORMAL LOW (ref 6.5–8.1)

## 2023-01-06 LAB — URINALYSIS, W/ REFLEX TO CULTURE (INFECTION SUSPECTED)
Bilirubin Urine: NEGATIVE
Glucose, UA: NEGATIVE mg/dL
Hgb urine dipstick: NEGATIVE
Ketones, ur: NEGATIVE mg/dL
Nitrite: NEGATIVE
Protein, ur: >=300 mg/dL — AB
RBC / HPF: >50 RBC/hpf (ref 0–5)
Specific Gravity, Urine: 1.014 (ref 1.005–1.030)
WBC, UA: >50 WBC/hpf (ref 0–5)
pH: 7 (ref 5.0–8.0)

## 2023-01-06 LAB — CBC WITH DIFFERENTIAL/PLATELET
Abs Immature Granulocytes: 0.04 10*3/uL (ref 0.00–0.07)
Basophils Absolute: 0.1 10*3/uL (ref 0.0–0.1)
Basophils Relative: 1 %
Eosinophils Absolute: 0.2 10*3/uL (ref 0.0–0.5)
Eosinophils Relative: 3 %
HCT: 32.4 % — ABNORMAL LOW (ref 36.0–46.0)
Hemoglobin: 9.4 g/dL — ABNORMAL LOW (ref 12.0–15.0)
Immature Granulocytes: 1 %
Lymphocytes Relative: 18 %
Lymphs Abs: 1.2 10*3/uL (ref 0.7–4.0)
MCH: 28.6 pg (ref 26.0–34.0)
MCHC: 29 g/dL — ABNORMAL LOW (ref 30.0–36.0)
MCV: 98.5 fL (ref 80.0–100.0)
Monocytes Absolute: 0.6 10*3/uL (ref 0.1–1.0)
Monocytes Relative: 9 %
Neutro Abs: 4.6 10*3/uL (ref 1.7–7.7)
Neutrophils Relative %: 68 %
Platelets: 188 10*3/uL (ref 150–400)
RBC: 3.29 MIL/uL — ABNORMAL LOW (ref 3.87–5.11)
RDW: 17.6 % — ABNORMAL HIGH (ref 11.5–15.5)
WBC: 6.7 10*3/uL (ref 4.0–10.5)
nRBC: 0 % (ref 0.0–0.2)

## 2023-01-06 LAB — LACTIC ACID, PLASMA
Lactic Acid, Venous: 1.4 mmol/L (ref 0.5–1.9)
Lactic Acid, Venous: 1 mmol/L (ref 0.5–1.9)

## 2023-01-06 MED ORDER — SODIUM CHLORIDE 0.9 % IV SOLN
1.0000 g | Freq: Once | INTRAVENOUS | Status: AC
  Administered 2023-01-06: 1 g via INTRAVENOUS
  Filled 2023-01-06: qty 10

## 2023-01-06 MED ORDER — LACTATED RINGERS IV BOLUS
500.0000 mL | Freq: Once | INTRAVENOUS | Status: AC
  Administered 2023-01-06: 500 mL via INTRAVENOUS

## 2023-01-06 MED ORDER — LORAZEPAM 0.5 MG PO TABS
0.5000 mg | ORAL_TABLET | Freq: Once | ORAL | Status: AC
  Administered 2023-01-06: 0.5 mg via ORAL
  Filled 2023-01-06: qty 1

## 2023-01-06 MED ORDER — ACETAMINOPHEN 325 MG PO TABS
650.0000 mg | ORAL_TABLET | Freq: Once | ORAL | Status: AC
  Administered 2023-01-06: 650 mg via ORAL
  Filled 2023-01-06: qty 2

## 2023-01-06 NOTE — ED Notes (Signed)
[facility] called. Pt placed on transport list 

## 2023-01-06 NOTE — ED Provider Notes (Signed)
  Physical Exam  BP (!) 105/43 (BP Location: Left Arm)   Pulse 86   Temp 97.8 F (36.6 C) (Oral)   Resp 18   SpO2 97%   Physical Exam  Procedures  Procedures  ED Course / MDM   Clinical Course as of 01/06/23 1154  Tue Jan 06, 2023  1153 CT cervical spine reviewed interpreted in the presence of acute abnormality noted CT head reviewed and interpreted [place] no acute intracranial abnormality noted [DR]  1154 CT abdomen and pelvis without acute intra-abdominal abnormality asymmetric right buttock soft tissue contusion Mild bladder wall thickening  [DR]  1154 Chest x-Shanequia Kendrick without acute abnormalities noted [DR]    Clinical Course User Index [DR] Margarita Grizzle, MD   Medical Decision Making Amount and/or Complexity of Data Reviewed Labs: ordered. Radiology: ordered.  Risk OTC drugs. Prescription drug management.   77 yo female on steroids for ra here with report of unwitnessed fall.  Facial injury. Complaining of chills and dysuria.  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 Perforation of bladder at that time.   Dysuria Fall with facial trauma, no thinners Chills Seen 5 days ago for fall No fever,n,v,d, cp or dyspnea Patient not ambulatory at baseline and cognitive impairment Lactic acid normal Patient receiving ativan for scans Plan review scans and urine Probable d/c if vital signs stable       Margarita Grizzle, MD 01/06/23 1155

## 2023-01-06 NOTE — ED Triage Notes (Signed)
Pt bib EMS from [facility] for a fall. States she fell out of bed c/o pain on head denies LOC and thinners. Facility states she she is A&O but has been having hallucinations and dysuria for one day. BP 182/96 HR 90 98% RA CBG 114 temp 98.2

## 2023-01-06 NOTE — Discharge Instructions (Addendum)
You were seen and evaluated here for a fall.  You have a urinary tract infection received Rocephin here in the emergency department Please drink plenty of fluids Use acetaminophen as needed for pain Follow-up with your doctor this week

## 2023-01-06 NOTE — ED Provider Notes (Signed)
EMERGENCY DEPARTMENT AT [facility] Provider Note   CSN: [id number] Arrival date & time: 01/06/23  0349     History  Chief Complaint  Patient presents with   Fall    Pt bib EMS from [facility] for a fall. States she fell out of bed c/o pain on head denies LOC and thinners. Facility states she she is A&O but has been having hallucinations and dysuria for one day. BP 182/96 HR 90 98% RA CBG 114 temp 98.2    [name] is a 77 y.o. female.  The history is provided by the patient, the EMS personnel and medical records.  Fall  [name] is a 77 y.o. female who presents to the Emergency Department complaining of fall.  She presents to the emergency department by EMS from [facility] for evaluation following unwitnessed fall.  Patient states she is unsure why she fell but she states that she was feeling very cold this trying to have a blanket pulled up over her and then she found herself on the floor.  She did recently have a fall several days ago and hit her face at that time.  Staff report that she has been experiencing hallucinations and dysuria for the last 24 hours.  Patient complains of mild headache.  She does state that she has been having issues with leg swelling for the last couple weeks but thinks this is overall improving.  No reported fevers, chest pain, difficulty breathing, abdominal pain, nausea, vomiting.   Additional history obtained from patient's sister over the phone after patient's initial ED assessment.  Sister was unaware of hallucinations but states that the patient has been sleeping up in her wheelchair because she is afraid of falling out of the bed.  Home Medications Prior to Admission medications   Medication Sig Start Date End Date Taking? Authorizing Provider  ACCU-CHEK GUIDE test strip Check blood sugar every morning 10/29/17   [provider]  acetaminophen (TYLENOL) 500 MG tablet Take 1,000 mg by  mouth every 6 (six) hours as needed for fever (or pain).    [provider]  aspirin 81 MG chewable tablet Chew 81 mg by mouth daily. 05/24/21   [provider]  Benzocaine-Menthol (SORE THROAT) 15-3.6 MG LOZG Use as directed 1 lozenge in the mouth or throat every 2 (two) hours as needed (sore throat).    [provider]  benztropine (COGENTIN) 0.5 MG tablet Take 0.5 mg by mouth in the morning and at bedtime.    [provider]  Cholecalciferol (VITAMIN D3) 50 MCG (2000 UT) TABS Take 2,000 Units by mouth in the morning.    [provider]  Dextromethorphan-guaiFENesin 5-100 MG/5ML LIQD Take 20 mLs by mouth every 4 (four) hours as needed (cough/congestion). 09/10/22   [provider]  ENULOSE 10 GM/15ML SOLN Take 10 g by mouth daily. 07/25/22   [provider]  escitalopram (LEXAPRO) 10 MG tablet Take 15 mg by mouth at bedtime.    [provider]  ferrous sulfate 325 (65 FE) MG EC tablet Take 325 mg by mouth every Monday, Wednesday, and Friday.    [provider]  furosemide (LASIX) 40 MG tablet Take 0.5 tablets (20 mg total) by mouth daily. Patient not taking: Reported on 11/10/2022 11/03/22   Alwyn Ren, MD  HYDROcodone-acetaminophen (NORCO/VICODIN) 5-325 MG tablet Take 1 tablet by mouth every 4 (four) hours as needed (for pain).    [provider]  hydroxychloroquine (PLAQUENIL) 200 MG tablet Take 600 mg by mouth daily.    [provider]  hydrOXYzine (VISTARIL) 50 MG capsule Take 50 mg by mouth in the morning and at bedtime. 09/17/22   [provider]  ipratropium (ATROVENT) 0.03 % nasal spray Place 2 sprays into both nostrils 2 (two) times daily as needed for rhinitis. 05/16/22   [provider]  LASIX 20 MG tablet Take 20 mg by mouth in the morning.    [provider]  leflunomide (ARAVA) 20 MG tablet Take 20 mg by mouth daily.    [provider]  loperamide  (IMODIUM A-D) 2 MG tablet Take 2 mg by mouth See admin instructions. Take 2 mg by mouth as needed/as directed after each loose stool- MAX OF 3 TABLETS/24 HOURS    [provider]  loratadine (CLARITIN) 10 MG tablet Take 1 tablet (10 mg total) by mouth daily. Patient taking differently: Take 10 mg by mouth in the morning. 08/22/22   Achille Rich, PA-C  melatonin 5 MG TABS Take 5 mg by mouth at bedtime.    [provider]  metoprolol succinate (TOPROL-XL) 25 MG 24 hr tablet Take 25 mg by mouth daily.    [provider]  nystatin cream (MYCOSTATIN) Apply 1 Application topically 2 (two) times daily as needed (for redness- under abdominal folds and/or under the breasts). 07/24/22   [provider]  OLANZapine (ZYPREXA) 5 MG tablet Take 5 mg by mouth daily.    [provider]  polyethylene glycol (MIRALAX / GLYCOLAX) 17 g packet Take 17 g by mouth 2 (two) times daily. Patient taking differently: Take 17 g by mouth in the morning and at bedtime. 12/31/20   Regalado, Belkys A, MD  predniSONE (DELTASONE) 5 MG tablet Take 5 mg by mouth daily with breakfast.    [provider]  trimethoprim-polymyxin b (POLYTRIM) ophthalmic solution Place 1 drop into both eyes every 4 (four) hours. While awake Patient not taking: Reported on 10/01/2022 08/22/22   Achille Rich, PA-C  vitamin C (ASCORBIC ACID) 250 MG tablet Take 250 mg by mouth every Monday, Wednesday, and Friday.    [provider]      Allergies    Iodinated contrast media, Abilify [aripiprazole], Ultram [tramadol hcl], and Zithromax [azithromycin]    Review of Systems   Review of Systems  All other systems reviewed and are negative.   Physical Exam Updated Vital Signs BP (!) 105/43 (BP Location: Left Arm)   Pulse 86   Temp 97.8 F (36.6 C) (Oral)   Resp 18   SpO2 97%  Physical Exam Vitals and nursing note reviewed.  Constitutional:      Appearance: She is well-developed.  HENT:      Head: Normocephalic.     Comments: Ecchymosis to the forehead, nasal bridge and maxillary region bilaterally.  Pupils equal round and reactive, EOMI. Cardiovascular:     Rate and Rhythm: Normal rate and regular rhythm.     Heart sounds: No murmur heard. Pulmonary:     Effort: Pulmonary effort is normal. No respiratory distress.     Breath sounds: Normal breath sounds.  Abdominal:     Palpations: Abdomen is soft.     Tenderness: There is no abdominal tenderness. There is no guarding or rebound.  Musculoskeletal:        General: No tenderness.     Comments: Soft tissue swelling, erythema to bilateral lower extremities, left greater than right.  No  hip tenderness to palpation.  Skin:    General: Skin is warm and dry.  Neurological:     Mental Status: She is alert and oriented to person, place, and time.  Psychiatric:        Behavior: Behavior normal.     ED Results / Procedures / Treatments   Labs (all labs ordered are listed, but only abnormal results are displayed) Labs Reviewed  COMPREHENSIVE METABOLIC PANEL - Abnormal; Notable for the following components:      Result Value   Chloride 112 (*)    CO2 20 (*)    Creatinine, Ser 1.12 (*)    Calcium 7.0 (*)    Total Protein 5.7 (*)    Albumin 3.0 (*)    GFR, Estimated 51 (*)    All other components within normal limits  CBC WITH DIFFERENTIAL/PLATELET - Abnormal; Notable for the following components:   RBC 3.29 (*)    Hemoglobin 9.4 (*)    HCT 32.4 (*)    MCHC 29.0 (*)    RDW 17.6 (*)    All other components within normal limits  URINALYSIS, W/ REFLEX TO CULTURE (INFECTION SUSPECTED) - Abnormal; Notable for the following components:   APPearance TURBID (*)    Protein, ur >=300 (*)    Leukocytes,Ua MODERATE (*)    Bacteria, UA MANY (*)    All other components within normal limits  URINE CULTURE  LACTIC ACID, PLASMA  LACTIC ACID, PLASMA    EKG None  Radiology DG Chest 2 View  Result Date: 01/06/2023 CLINICAL  DATA:  77 year old female status post fall. EXAM: CHEST - 2 VIEW COMPARISON:  Chest radiographs 01/02/2023 and earlier. FINDINGS: Semi upright AP and lateral views at 0509 hours. Chronic cervical and lumbar spinal fusion changes are partially visible. Stable low lung volumes. Mediastinal contours are within normal limits. Visualized tracheal air column is within normal limits. Patchy bibasilar opacity most resembles atelectasis, mildly increased. No superimposed pneumothorax, pulmonary edema, pleural effusion, or consolidation identified. Negative visible bowel gas. No acute osseous abnormality identified. IMPRESSION: Low lung volumes with bibasilar atelectasis. No other acute cardiopulmonary abnormality or acute traumatic injury identified. Electronically Signed   By: Odessa Fleming M.D.   On: 01/06/2023 05:27    Procedures Procedures    Medications Ordered in ED Medications  cefTRIAXone (ROCEPHIN) 1 g in sodium chloride 0.9 % 100 mL IVPB (has no administration in time range)  acetaminophen (TYLENOL) tablet 650 mg (650 mg Oral Given 01/06/23 0448)  LORazepam (ATIVAN) tablet 0.5 mg (0.5 mg Oral Given 01/06/23 0815)  lactated ringers bolus 500 mL (500 mLs Intravenous New Bag/Given 01/06/23 1610)    ED Course/ Medical Decision Making/ A&P                             Medical Decision Making Amount and/or Complexity of Data Reviewed Labs: ordered. Radiology: ordered.  Risk OTC drugs. Prescription drug management.   Patient here for evaluation of injuries following a fall out of her wheelchair.  She does report feeling cold prior to the fall but no reported fevers at the facility.  There was report of hallucinations prior to ED arrival but patient is calm and does not appear to be hallucinating at this time.  She does have dysuria.  UA is concerning for UTI-will start on antibiotics given her symptoms and send cultures.  Labs with stable anemia, renal function.  Patient care transferred pending  imaging.  Final Clinical Impression(s) / ED Diagnoses Final diagnoses:  None    Rx / DC Orders ED Discharge Orders     None         Tilden Fossa, MD 01/06/23 [phone]

## 2023-01-07 DIAGNOSIS — N390 Urinary tract infection, site not specified: Secondary | ICD-10-CM | POA: Diagnosis not present

## 2023-01-07 DIAGNOSIS — E049 Nontoxic goiter, unspecified: Secondary | ICD-10-CM | POA: Diagnosis not present

## 2023-01-07 DIAGNOSIS — R296 Repeated falls: Secondary | ICD-10-CM | POA: Diagnosis not present

## 2023-01-07 DIAGNOSIS — W010XXA Fall on same level from slipping, tripping and stumbling without subsequent striking against object, initial encounter: Secondary | ICD-10-CM | POA: Diagnosis not present

## 2023-01-07 DIAGNOSIS — S0081XA Abrasion of other part of head, initial encounter: Secondary | ICD-10-CM | POA: Diagnosis not present

## 2023-01-07 LAB — URINE CULTURE

## 2023-01-08 DIAGNOSIS — Z7952 Long term (current) use of systemic steroids: Secondary | ICD-10-CM | POA: Diagnosis not present

## 2023-01-08 DIAGNOSIS — E1122 Type 2 diabetes mellitus with diabetic chronic kidney disease: Secondary | ICD-10-CM | POA: Diagnosis not present

## 2023-01-08 DIAGNOSIS — S81802D Unspecified open wound, left lower leg, subsequent encounter: Secondary | ICD-10-CM | POA: Diagnosis not present

## 2023-01-08 DIAGNOSIS — D631 Anemia in chronic kidney disease: Secondary | ICD-10-CM | POA: Diagnosis not present

## 2023-01-08 DIAGNOSIS — E042 Nontoxic multinodular goiter: Secondary | ICD-10-CM | POA: Diagnosis not present

## 2023-01-08 DIAGNOSIS — G3184 Mild cognitive impairment, so stated: Secondary | ICD-10-CM | POA: Diagnosis not present

## 2023-01-08 DIAGNOSIS — Z993 Dependence on wheelchair: Secondary | ICD-10-CM | POA: Diagnosis not present

## 2023-01-08 DIAGNOSIS — Z7982 Long term (current) use of aspirin: Secondary | ICD-10-CM | POA: Diagnosis not present

## 2023-01-08 DIAGNOSIS — M069 Rheumatoid arthritis, unspecified: Secondary | ICD-10-CM | POA: Diagnosis not present

## 2023-01-08 DIAGNOSIS — E890 Postprocedural hypothyroidism: Secondary | ICD-10-CM | POA: Diagnosis not present

## 2023-01-08 DIAGNOSIS — J309 Allergic rhinitis, unspecified: Secondary | ICD-10-CM | POA: Diagnosis not present

## 2023-01-08 DIAGNOSIS — M4800 Spinal stenosis, site unspecified: Secondary | ICD-10-CM | POA: Diagnosis not present

## 2023-01-08 DIAGNOSIS — Z466 Encounter for fitting and adjustment of urinary device: Secondary | ICD-10-CM | POA: Diagnosis not present

## 2023-01-08 DIAGNOSIS — E785 Hyperlipidemia, unspecified: Secondary | ICD-10-CM | POA: Diagnosis not present

## 2023-01-08 DIAGNOSIS — E559 Vitamin D deficiency, unspecified: Secondary | ICD-10-CM | POA: Diagnosis not present

## 2023-01-08 DIAGNOSIS — E1151 Type 2 diabetes mellitus with diabetic peripheral angiopathy without gangrene: Secondary | ICD-10-CM | POA: Diagnosis not present

## 2023-01-08 DIAGNOSIS — N1831 Chronic kidney disease, stage 3a: Secondary | ICD-10-CM | POA: Diagnosis not present

## 2023-01-08 DIAGNOSIS — K5900 Constipation, unspecified: Secondary | ICD-10-CM | POA: Diagnosis not present

## 2023-01-08 DIAGNOSIS — I129 Hypertensive chronic kidney disease with stage 1 through stage 4 chronic kidney disease, or unspecified chronic kidney disease: Secondary | ICD-10-CM | POA: Diagnosis not present

## 2023-01-08 DIAGNOSIS — E039 Hypothyroidism, unspecified: Secondary | ICD-10-CM | POA: Diagnosis not present

## 2023-01-08 DIAGNOSIS — G894 Chronic pain syndrome: Secondary | ICD-10-CM | POA: Diagnosis not present

## 2023-01-08 DIAGNOSIS — G4700 Insomnia, unspecified: Secondary | ICD-10-CM | POA: Diagnosis not present

## 2023-01-08 LAB — URINE CULTURE

## 2023-01-09 DIAGNOSIS — G8222 Paraplegia, incomplete: Secondary | ICD-10-CM | POA: Diagnosis not present

## 2023-01-10 LAB — URINE CULTURE: Culture: >=100000 — AB

## 2023-01-11 ENCOUNTER — Telehealth (HOSPITAL_BASED_OUTPATIENT_CLINIC_OR_DEPARTMENT_OTHER): Payer: Self-pay | Admitting: *Deleted

## 2023-01-11 NOTE — Progress Notes (Signed)
ED Antimicrobial Stewardship Positive Culture Follow Up   KAIDYNCE SPYKER is a 77 y.o. female who presented to [facility] on 01/06/2023 with a chief complaint of an unwitnessed fall and hallucinations.  Also reportedly had chills and dysuria. Received ceftriaxone 1 gram IV x 1 dose in the ED. Chief Complaint  Patient presents with   Fall    Pt bib EMS from [facility] for a fall. States she fell out of bed c/o pain on head denies LOC and thinners. Facility states she she is A&O but has been having hallucinations and dysuria for one day. BP 182/96 HR 90 98% RA CBG 114 temp 98.2    Recent Results (from the past 720 hour(s))  Urine Culture     Status: Abnormal   Collection Time: 01/06/23  7:40 AM   Specimen: Urine, Random  Result Value Ref Range Status   Specimen Description   Final    URINE, RANDOM Performed at [facility], [address], [place], [place] [address]    Special Requests   Final    NONE Reflexed from [id number] Performed at [facility], [address] [place], [place] [address]    Culture (A)  Final    >=100,000 COLONIES/mL PROTEUS MIRABILIS 80,000 COLONIES/mL ENTEROCOCCUS FAECALIS    Report Status 01/10/2023 FINAL  Final   Organism ID, Bacteria PROTEUS MIRABILIS (A)  Final   Organism ID, Bacteria ENTEROCOCCUS FAECALIS (A)  Final      Susceptibility   Enterococcus faecalis - MIC*    AMPICILLIN <=2 SENSITIVE Sensitive     NITROFURANTOIN <=16 SENSITIVE Sensitive     VANCOMYCIN 1 SENSITIVE Sensitive     * 80,000 COLONIES/mL ENTEROCOCCUS FAECALIS   Proteus mirabilis - MIC*    AMPICILLIN <=2 SENSITIVE Sensitive     CEFAZOLIN <=4 SENSITIVE Sensitive     CEFEPIME <=0.12 SENSITIVE Sensitive     CEFTRIAXONE <=0.25 SENSITIVE Sensitive     CIPROFLOXACIN >=4 RESISTANT Resistant     GENTAMICIN <=1 SENSITIVE Sensitive     IMIPENEM 2 SENSITIVE Sensitive     NITROFURANTOIN 128 RESISTANT Resistant     TRIMETH/SULFA >=320 RESISTANT  Resistant     AMPICILLIN/SULBACTAM <=2 SENSITIVE Sensitive     PIP/TAZO <=4 SENSITIVE Sensitive     * >=100,000 COLONIES/mL PROTEUS MIRABILIS    [x]  Patient discharged originally without antimicrobial agent and treatment is now indicated  Please call facility ([facility]) to provide above culture results and let them know that amoxicillin 500 mg twice a day for seven days would cover both isolates present in the urine culture.  New antibiotic recommendation: amoxicillin 500mg  twice a day for seven days.  ED Provider: Barrie Dunker, PA-C   Zipporah Finamore, [place] Barban 01/11/2023, 9:00 AM Clinical Pharmacist [phone]

## 2023-01-11 NOTE — Telephone Encounter (Signed)
Post ED Visit - Positive Culture Follow-up: Successful Patient Follow-Up  Culture assessed and recommendations reviewed by:  []  Enzo Bi, Pharm.D. []  Celedonio Miyamoto, Pharm.D., BCPS AQ-ID []  Garvin Fila, Pharm.D., BCPS []  Georgina Pillion, Pharm.D., BCPS []  [place], [address].D., BCPS, AAHIVP []  Estella Husk, Pharm.D., BCPS, AAHIVP []  Lysle Pearl, PharmD, BCPS []  Phillips Climes, PharmD, BCPS []  Agapito Games, PharmD, BCPS [x]  Winfred Burn, PharmD  Positive urine culture  [x]  Patient discharged without antimicrobial prescription and treatment is now indicated []  Organism is resistant to prescribed ED discharge antimicrobial []  Patient with positive blood cultures  Changes discussed with ED provider: Barrie Dunker, PA-C New antibiotic prescription Amoxicillin 500mg  Twice a day for 7 days  Spoke to [facility] [phone] and faxed report to [phone]  Contacted patient, date 01/11/23, time 0933   [name] 01/11/2023, 9:32 AM

## 2023-01-12 DIAGNOSIS — I129 Hypertensive chronic kidney disease with stage 1 through stage 4 chronic kidney disease, or unspecified chronic kidney disease: Secondary | ICD-10-CM | POA: Diagnosis not present

## 2023-01-12 DIAGNOSIS — K5900 Constipation, unspecified: Secondary | ICD-10-CM | POA: Diagnosis not present

## 2023-01-12 DIAGNOSIS — G894 Chronic pain syndrome: Secondary | ICD-10-CM | POA: Diagnosis not present

## 2023-01-12 DIAGNOSIS — E039 Hypothyroidism, unspecified: Secondary | ICD-10-CM | POA: Diagnosis not present

## 2023-01-12 DIAGNOSIS — M4800 Spinal stenosis, site unspecified: Secondary | ICD-10-CM | POA: Diagnosis not present

## 2023-01-12 DIAGNOSIS — E785 Hyperlipidemia, unspecified: Secondary | ICD-10-CM | POA: Diagnosis not present

## 2023-01-12 DIAGNOSIS — J309 Allergic rhinitis, unspecified: Secondary | ICD-10-CM | POA: Diagnosis not present

## 2023-01-12 DIAGNOSIS — Z7952 Long term (current) use of systemic steroids: Secondary | ICD-10-CM | POA: Diagnosis not present

## 2023-01-12 DIAGNOSIS — Z466 Encounter for fitting and adjustment of urinary device: Secondary | ICD-10-CM | POA: Diagnosis not present

## 2023-01-12 DIAGNOSIS — M069 Rheumatoid arthritis, unspecified: Secondary | ICD-10-CM | POA: Diagnosis not present

## 2023-01-12 DIAGNOSIS — S81802D Unspecified open wound, left lower leg, subsequent encounter: Secondary | ICD-10-CM | POA: Diagnosis not present

## 2023-01-12 DIAGNOSIS — G4700 Insomnia, unspecified: Secondary | ICD-10-CM | POA: Diagnosis not present

## 2023-01-12 DIAGNOSIS — E559 Vitamin D deficiency, unspecified: Secondary | ICD-10-CM | POA: Diagnosis not present

## 2023-01-12 DIAGNOSIS — G3184 Mild cognitive impairment, so stated: Secondary | ICD-10-CM | POA: Diagnosis not present

## 2023-01-12 DIAGNOSIS — N1831 Chronic kidney disease, stage 3a: Secondary | ICD-10-CM | POA: Diagnosis not present

## 2023-01-12 DIAGNOSIS — Z993 Dependence on wheelchair: Secondary | ICD-10-CM | POA: Diagnosis not present

## 2023-01-12 DIAGNOSIS — Z7982 Long term (current) use of aspirin: Secondary | ICD-10-CM | POA: Diagnosis not present

## 2023-01-12 DIAGNOSIS — E1122 Type 2 diabetes mellitus with diabetic chronic kidney disease: Secondary | ICD-10-CM | POA: Diagnosis not present

## 2023-01-12 DIAGNOSIS — D631 Anemia in chronic kidney disease: Secondary | ICD-10-CM | POA: Diagnosis not present

## 2023-01-12 DIAGNOSIS — E1151 Type 2 diabetes mellitus with diabetic peripheral angiopathy without gangrene: Secondary | ICD-10-CM | POA: Diagnosis not present

## 2023-01-13 DIAGNOSIS — S81802D Unspecified open wound, left lower leg, subsequent encounter: Secondary | ICD-10-CM | POA: Diagnosis not present

## 2023-01-13 DIAGNOSIS — Z7952 Long term (current) use of systemic steroids: Secondary | ICD-10-CM | POA: Diagnosis not present

## 2023-01-13 DIAGNOSIS — N1831 Chronic kidney disease, stage 3a: Secondary | ICD-10-CM | POA: Diagnosis not present

## 2023-01-13 DIAGNOSIS — D631 Anemia in chronic kidney disease: Secondary | ICD-10-CM | POA: Diagnosis not present

## 2023-01-13 DIAGNOSIS — M4800 Spinal stenosis, site unspecified: Secondary | ICD-10-CM | POA: Diagnosis not present

## 2023-01-13 DIAGNOSIS — E559 Vitamin D deficiency, unspecified: Secondary | ICD-10-CM | POA: Diagnosis not present

## 2023-01-13 DIAGNOSIS — J309 Allergic rhinitis, unspecified: Secondary | ICD-10-CM | POA: Diagnosis not present

## 2023-01-13 DIAGNOSIS — I129 Hypertensive chronic kidney disease with stage 1 through stage 4 chronic kidney disease, or unspecified chronic kidney disease: Secondary | ICD-10-CM | POA: Diagnosis not present

## 2023-01-13 DIAGNOSIS — Z7982 Long term (current) use of aspirin: Secondary | ICD-10-CM | POA: Diagnosis not present

## 2023-01-13 DIAGNOSIS — M069 Rheumatoid arthritis, unspecified: Secondary | ICD-10-CM | POA: Diagnosis not present

## 2023-01-13 DIAGNOSIS — Z466 Encounter for fitting and adjustment of urinary device: Secondary | ICD-10-CM | POA: Diagnosis not present

## 2023-01-13 DIAGNOSIS — E1122 Type 2 diabetes mellitus with diabetic chronic kidney disease: Secondary | ICD-10-CM | POA: Diagnosis not present

## 2023-01-13 DIAGNOSIS — K5900 Constipation, unspecified: Secondary | ICD-10-CM | POA: Diagnosis not present

## 2023-01-13 DIAGNOSIS — G3184 Mild cognitive impairment, so stated: Secondary | ICD-10-CM | POA: Diagnosis not present

## 2023-01-13 DIAGNOSIS — Z993 Dependence on wheelchair: Secondary | ICD-10-CM | POA: Diagnosis not present

## 2023-01-13 DIAGNOSIS — G894 Chronic pain syndrome: Secondary | ICD-10-CM | POA: Diagnosis not present

## 2023-01-13 DIAGNOSIS — E785 Hyperlipidemia, unspecified: Secondary | ICD-10-CM | POA: Diagnosis not present

## 2023-01-13 DIAGNOSIS — G4700 Insomnia, unspecified: Secondary | ICD-10-CM | POA: Diagnosis not present

## 2023-01-13 DIAGNOSIS — E1151 Type 2 diabetes mellitus with diabetic peripheral angiopathy without gangrene: Secondary | ICD-10-CM | POA: Diagnosis not present

## 2023-01-13 DIAGNOSIS — E039 Hypothyroidism, unspecified: Secondary | ICD-10-CM | POA: Diagnosis not present

## 2023-01-14 ENCOUNTER — Emergency Department (HOSPITAL_COMMUNITY)
Admission: EM | Admit: 2023-01-14 | Discharge: 2023-01-14 | Disposition: A | Discharge status: Home / Self Care | Payer: Medicare Other | Attending: Emergency Medicine | Admitting: Emergency Medicine

## 2023-01-14 ENCOUNTER — Emergency Department (HOSPITAL_COMMUNITY): Payer: Medicare Other

## 2023-01-14 ENCOUNTER — Encounter (HOSPITAL_COMMUNITY): Payer: Self-pay

## 2023-01-14 ENCOUNTER — Other Ambulatory Visit: Payer: Self-pay

## 2023-01-14 DIAGNOSIS — M25559 Pain in unspecified hip: Secondary | ICD-10-CM | POA: Diagnosis not present

## 2023-01-14 DIAGNOSIS — E119 Type 2 diabetes mellitus without complications: Secondary | ICD-10-CM | POA: Diagnosis not present

## 2023-01-14 DIAGNOSIS — R519 Headache, unspecified: Secondary | ICD-10-CM | POA: Diagnosis present

## 2023-01-14 DIAGNOSIS — N390 Urinary tract infection, site not specified: Secondary | ICD-10-CM | POA: Diagnosis not present

## 2023-01-14 DIAGNOSIS — S0990XA Unspecified injury of head, initial encounter: Secondary | ICD-10-CM | POA: Diagnosis not present

## 2023-01-14 DIAGNOSIS — I10 Essential (primary) hypertension: Secondary | ICD-10-CM | POA: Insufficient documentation

## 2023-01-14 DIAGNOSIS — R531 Weakness: Secondary | ICD-10-CM | POA: Diagnosis not present

## 2023-01-14 DIAGNOSIS — Z743 Need for continuous supervision: Secondary | ICD-10-CM | POA: Diagnosis not present

## 2023-01-14 DIAGNOSIS — M25551 Pain in right hip: Secondary | ICD-10-CM | POA: Diagnosis not present

## 2023-01-14 DIAGNOSIS — R0902 Hypoxemia: Secondary | ICD-10-CM | POA: Diagnosis not present

## 2023-01-14 DIAGNOSIS — S0083XA Contusion of other part of head, initial encounter: Secondary | ICD-10-CM | POA: Insufficient documentation

## 2023-01-14 DIAGNOSIS — Z7982 Long term (current) use of aspirin: Secondary | ICD-10-CM | POA: Insufficient documentation

## 2023-01-14 DIAGNOSIS — W19XXXA Unspecified fall, initial encounter: Secondary | ICD-10-CM | POA: Diagnosis not present

## 2023-01-14 DIAGNOSIS — E049 Nontoxic goiter, unspecified: Secondary | ICD-10-CM | POA: Diagnosis not present

## 2023-01-14 DIAGNOSIS — E079 Disorder of thyroid, unspecified: Secondary | ICD-10-CM | POA: Diagnosis not present

## 2023-01-14 DIAGNOSIS — S0081XA Abrasion of other part of head, initial encounter: Secondary | ICD-10-CM | POA: Diagnosis not present

## 2023-01-14 DIAGNOSIS — R220 Localized swelling, mass and lump, head: Secondary | ICD-10-CM | POA: Diagnosis not present

## 2023-01-14 DIAGNOSIS — W010XXA Fall on same level from slipping, tripping and stumbling without subsequent striking against object, initial encounter: Secondary | ICD-10-CM | POA: Diagnosis not present

## 2023-01-14 DIAGNOSIS — Z981 Arthrodesis status: Secondary | ICD-10-CM | POA: Diagnosis not present

## 2023-01-14 DIAGNOSIS — Z79899 Other long term (current) drug therapy: Secondary | ICD-10-CM | POA: Diagnosis not present

## 2023-01-14 LAB — COMPREHENSIVE METABOLIC PANEL
ALT: 20 U/L (ref 0–44)
AST: 22 U/L (ref 15–41)
Albumin: 3 g/dL — ABNORMAL LOW (ref 3.5–5.0)
Alkaline Phosphatase: 94 U/L (ref 38–126)
Anion gap: 9 (ref 5–15)
BUN: 22 mg/dL (ref 8–23)
CO2: 23 mmol/L (ref 22–32)
Calcium: 7.6 mg/dL — ABNORMAL LOW (ref 8.9–10.3)
Chloride: 108 mmol/L (ref 98–111)
Creatinine, Ser: 1.14 mg/dL — ABNORMAL HIGH (ref 0.44–1.00)
GFR, Estimated: 50 mL/min — ABNORMAL LOW (ref >60)
Glucose, Bld: 91 mg/dL (ref 70–99)
Potassium: 3.4 mmol/L — ABNORMAL LOW (ref 3.5–5.1)
Sodium: 140 mmol/L (ref 135–145)
Total Bilirubin: 0.3 mg/dL (ref 0.3–1.2)
Total Protein: 6.1 g/dL — ABNORMAL LOW (ref 6.5–8.1)

## 2023-01-14 LAB — CBC
HCT: 30.2 % — ABNORMAL LOW (ref 36.0–46.0)
Hemoglobin: 9 g/dL — ABNORMAL LOW (ref 12.0–15.0)
MCH: 28.4 pg (ref 26.0–34.0)
MCHC: 29.8 g/dL — ABNORMAL LOW (ref 30.0–36.0)
MCV: 95.3 fL (ref 80.0–100.0)
Platelets: 215 10*3/uL (ref 150–400)
RBC: 3.17 MIL/uL — ABNORMAL LOW (ref 3.87–5.11)
RDW: 17.2 % — ABNORMAL HIGH (ref 11.5–15.5)
WBC: 5.9 10*3/uL (ref 4.0–10.5)
nRBC: 0 % (ref 0.0–0.2)

## 2023-01-14 MED ORDER — ACETAMINOPHEN 500 MG PO TABS
1000.0000 mg | ORAL_TABLET | Freq: Once | ORAL | Status: AC
  Administered 2023-01-14: 1000 mg via ORAL
  Filled 2023-01-14: qty 2

## 2023-01-14 MED ORDER — LORAZEPAM 1 MG PO TABS
1.0000 mg | ORAL_TABLET | Freq: Once | ORAL | Status: AC
  Administered 2023-01-14: 1 mg via ORAL
  Filled 2023-01-14: qty 1

## 2023-01-14 NOTE — Discharge Instructions (Signed)
Please do physical therapy at your independent living facility. Tylenol 1000 mg every 6 hours as needed for pain.

## 2023-01-14 NOTE — ED Provider Notes (Signed)
[facility] AT [facility] Provider Note   CSN: [id number] Arrival date & time: 01/14/23  4098     History  Chief Complaint  Patient presents with   Fall    Pt from [facility] w/ c/o falling out of wheelchair this morning onto tile floor. Pt with pain on forehead R. Side, pt c/o neck pain , - for c-spine for EMS, and right hip pain r/t laying on that side after fall. This is pts 4th fall in the past 2 weeks. - thinners, - LOC     [name] is a 77 y.o. female.   Fall  Patient is a 77 year old female with past medical history significant for scoliosis of the spine, DM2, HTN, HLD, depression, some cognitive disability  Patient presents emergency room today after fall out of wheelchair.  She states that she does not actually recall the events leading up to this.  She states that she more or less woke up on the ground in front of her wheelchair.  She states she has some facial pain and some neck discomfort but otherwise feels well.  Denies any extremity pain.  States that she did not suffer any lacerations and has not had any bleeding.  No nausea or vomiting.  She denies any chest pain or difficulty breathing.  She does take chronic steroids due to her rheumatoid arthritis.  Into the anticoagulation.  Does take aspirin as antiplatelets.     Home Medications Prior to Admission medications   Medication Sig Start Date End Date Taking? Authorizing Provider  ACCU-CHEK GUIDE test strip Check blood sugar every morning 10/29/17   [provider]  acetaminophen (TYLENOL) 500 MG tablet Take 1,000 mg by mouth every 6 (six) hours as needed for fever (or pain).    [provider]  aspirin 81 MG chewable tablet Chew 81 mg by mouth daily. 05/24/21   [provider]  Benzocaine-Menthol (SORE THROAT) 15-3.6 MG LOZG Use as directed 1 lozenge in the mouth or throat every 2 (two) hours as needed (sore throat).    [provider]   benztropine (COGENTIN) 0.5 MG tablet Take 0.5 mg by mouth in the morning and at bedtime.    [provider]  Cholecalciferol (VITAMIN D3) 50 MCG (2000 UT) TABS Take 2,000 Units by mouth in the morning.    [provider]  Dextromethorphan-guaiFENesin 5-100 MG/5ML LIQD Take 20 mLs by mouth every 4 (four) hours as needed (cough/congestion). 09/10/22   [provider]  ENULOSE 10 GM/15ML SOLN Take 10 g by mouth daily. 07/25/22   [provider]  escitalopram (LEXAPRO) 10 MG tablet Take 15 mg by mouth at bedtime.    [provider]  ferrous sulfate 325 (65 FE) MG EC tablet Take 325 mg by mouth every Monday, Wednesday, and Friday.    [provider]  furosemide (LASIX) 40 MG tablet Take 0.5 tablets (20 mg total) by mouth daily. Patient not taking: Reported on 11/10/2022 11/03/22   Alwyn Ren, MD  HYDROcodone-acetaminophen (NORCO/VICODIN) 5-325 MG tablet Take 1 tablet by mouth every 4 (four) hours as needed (for pain).    [provider]  hydroxychloroquine (PLAQUENIL) 200 MG tablet Take 600 mg by mouth daily.    [provider]  hydrOXYzine (VISTARIL) 50 MG capsule Take 50 mg by mouth in the morning and at bedtime. 09/17/22   [provider]  ipratropium (ATROVENT) 0.03 % nasal spray Place 2 sprays into both nostrils  2 (two) times daily as needed for rhinitis. 05/16/22   [provider]  LASIX 20 MG tablet Take 20 mg by mouth in the morning.    [provider]  leflunomide (ARAVA) 20 MG tablet Take 20 mg by mouth daily.    [provider]  loperamide (IMODIUM A-D) 2 MG tablet Take 2 mg by mouth See admin instructions. Take 2 mg by mouth as needed/as directed after each loose stool- MAX OF 3 TABLETS/24 HOURS    [provider]  loratadine (CLARITIN) 10 MG tablet Take 1 tablet (10 mg total) by mouth daily. Patient taking differently: Take 10 mg by mouth in the morning. 08/22/22   Achille Rich, PA-C  melatonin 5 MG TABS Take 5 mg by mouth at bedtime.    [provider]  metoprolol succinate (TOPROL-XL) 25 MG 24 hr tablet Take 25 mg by mouth daily.    [provider]  nystatin cream (MYCOSTATIN) Apply 1 Application topically 2 (two) times daily as needed (for redness- under abdominal folds and/or under the breasts). 07/24/22   [provider]  OLANZapine (ZYPREXA) 5 MG tablet Take 5 mg by mouth daily.    [provider]  polyethylene glycol (MIRALAX / GLYCOLAX) 17 g packet Take 17 g by mouth 2 (two) times daily. Patient taking differently: Take 17 g by mouth in the morning and at bedtime. 12/31/20   Regalado, Belkys A, MD  predniSONE (DELTASONE) 5 MG tablet Take 5 mg by mouth daily with breakfast.    [provider]  trimethoprim-polymyxin b (POLYTRIM) ophthalmic solution Place 1 drop into both eyes every 4 (four) hours. While awake Patient not taking: Reported on 10/01/2022 08/22/22   Achille Rich, PA-C  vitamin C (ASCORBIC ACID) 250 MG tablet Take 250 mg by mouth every Monday, Wednesday, and Friday.    [provider]      Allergies    Iodinated contrast media, Abilify [aripiprazole], Ultram [tramadol hcl], and Zithromax [azithromycin]    Review of Systems   Review of Systems  Physical Exam Updated Vital Signs BP (!) 106/52   Pulse 86   Temp 98.3 F (36.8 C) (Oral)   Resp 20   Ht 5\' 4"  (1.626 m)   Wt 77.1 kg   SpO2 94%   BMI 29.18 kg/m  Physical Exam Vitals and nursing note reviewed.  Constitutional:      General: She is not in acute distress. HENT:     Head: Normocephalic.     Comments: Bruising diffusely across face and forehead    Nose: Nose normal.  Eyes:     General: No scleral icterus.    Comments: EOMI  Cardiovascular:     Rate and Rhythm: Normal rate and regular rhythm.     Pulses: Normal pulses.     Heart sounds: Normal heart sounds.  Pulmonary:     Effort: Pulmonary effort is normal. No  respiratory distress.     Breath sounds: No wheezing.  Abdominal:     Palpations: Abdomen is soft.     Tenderness: There is no abdominal tenderness.  Musculoskeletal:     Cervical back: Normal range of motion.     Right lower leg: No edema.     Left lower leg: No edema.     Comments: No focal bony tenderness of the face, there is some diffuse posterior neck tenderness.  No focal bony midline tenderness.  No T, L-spine tenderness  Upper and lower extremities without bony tenderness.  Some discomfort with manipulation of the pelvis but no focal bony tenderness of the hips or pelvis.  Bilateral legs with slight edema and chronic wounds as pictured below  Skin:    General: Skin is warm and dry.     Capillary Refill: Capillary refill takes less than 2 seconds.  Neurological:     Mental Status: She is alert. Mental status is at baseline.  Psychiatric:        Mood and Affect: Mood normal.        Behavior: Behavior normal.        ED Results / Procedures / Treatments   Labs (all labs ordered are listed, but only abnormal results are displayed) Labs Reviewed  CBC - Abnormal; Notable for the following components:      Result Value   RBC 3.17 (*)    Hemoglobin 9.0 (*)    HCT 30.2 (*)    MCHC 29.8 (*)    RDW 17.2 (*)    All other components within normal limits  COMPREHENSIVE METABOLIC PANEL - Abnormal; Notable for the following components:   Potassium 3.4 (*)    Creatinine, Ser 1.14 (*)    Calcium 7.6 (*)    Total Protein 6.1 (*)    Albumin 3.0 (*)    GFR, Estimated 50 (*)    All other components within normal limits    EKG EKG Interpretation Date/Time:  Wednesday January 14 2023 06:22:20 EDT Ventricular Rate:  78 PR Interval:  148 QRS Duration:  86 QT Interval:  436 QTC Calculation: 497 R Axis:   27  Text Interpretation: Sinus rhythm Abnormal R-wave progression, early transition Borderline prolonged QT interval No significant change since last tracing Confirmed by  Jacalyn Lefevre [phone]) on 01/14/2023 8:42:37 AM  Radiology CT HEAD WO CONTRAST ( )  Result Date: 01/14/2023 CLINICAL DATA:  Fall out of wheelchair. EXAM: CT HEAD WITHOUT CONTRAST CT MAXILLOFACIAL WITHOUT CONTRAST CT CERVICAL SPINE WITHOUT CONTRAST TECHNIQUE: Multidetector CT imaging of the head, cervical spine, and maxillofacial structures were performed using the standard protocol without intravenous contrast. Multiplanar CT image reconstructions of the cervical spine and maxillofacial structures were also generated. RADIATION DOSE REDUCTION: This exam was performed according to the departmental dose-optimization program which includes automated exposure control, adjustment of the mA and/or kV according to patient size and/or use of iterative reconstruction technique. COMPARISON:  CT head, facial bones, and cervical spine 01/06/2023 FINDINGS: Image quality is significantly degraded by motion artifact. CT HEAD FINDINGS Brain: There is no definite evidence of acute intracranial hemorrhage, extra-axial fluid collection, or acute infarct, though as above image quality is significantly degraded by motion artifact. Parenchymal volume is stable. The ventricles are stable in size. There is no midline shift. Vascular: No hyperdense vessel or unexpected calcification. Skull: There is no definite calvarial fracture. Other: There is right frontal scalp swelling. CT MAXILLOFACIAL FINDINGS Osseous: There is no definite acute facial bone fracture, though image quality is markedly degraded by motion artifact. No layering blood is seen in the paranasal sinuses. There is degenerative change of the left temporomandibular joint. Orbits: A left lens implant is noted. The globes are intact. There is no definite acute injury to the globes or orbits. Sinuses: A retention cyst is again noted in the left maxillary sinus. Soft tissues: Unremarkable. CT CERVICAL SPINE FINDINGS Alignment: There is no antero or retrolisthesis. There is no  jumped or perched facet or other evidence of traumatic malalignment. Skull base and vertebrae: Skull base alignment appears maintained, within the  confines of motion degraded images. There is no definite evidence of acute fracture. Postsurgical changes reflecting C3-C4 and C4 through C7 ACDF are again noted. There is no definite evidence of hardware related complication. Soft tissues and spinal canal: No prevertebral fluid or swelling. No definite canal hematoma. Disc levels: Facet arthropathy most advanced on the left at C3-C4 is unchanged. Mild adjacent segment disease at C2-C3 and C7-T1 is stable. Upper chest: The imaged lung apices are clear. Other: The enlarged right thyroid lobe is unchanged. Thyroid ultrasound has previously been recommended. IMPRESSION: 1. Significantly motion degraded study. 2. Within this confine, no definite acute intracranial pathology, acute facial bone fracture, or acute injury in the cervical spine. If there is high clinical concern, repeat imaging is recommended. 3. Right frontal scalp swelling. Electronically Signed   By: Lesia Hausen M.D.   On: 01/14/2023 09:48   CT Cervical Spine Wo Contrast  Result Date: 01/14/2023 CLINICAL DATA:  Fall out of wheelchair. EXAM: CT HEAD WITHOUT CONTRAST CT MAXILLOFACIAL WITHOUT CONTRAST CT CERVICAL SPINE WITHOUT CONTRAST TECHNIQUE: Multidetector CT imaging of the head, cervical spine, and maxillofacial structures were performed using the standard protocol without intravenous contrast. Multiplanar CT image reconstructions of the cervical spine and maxillofacial structures were also generated. RADIATION DOSE REDUCTION: This exam was performed according to the departmental dose-optimization program which includes automated exposure control, adjustment of the mA and/or kV according to patient size and/or use of iterative reconstruction technique. COMPARISON:  CT head, facial bones, and cervical spine 01/06/2023 FINDINGS: Image quality is  significantly degraded by motion artifact. CT HEAD FINDINGS Brain: There is no definite evidence of acute intracranial hemorrhage, extra-axial fluid collection, or acute infarct, though as above image quality is significantly degraded by motion artifact. Parenchymal volume is stable. The ventricles are stable in size. There is no midline shift. Vascular: No hyperdense vessel or unexpected calcification. Skull: There is no definite calvarial fracture. Other: There is right frontal scalp swelling. CT MAXILLOFACIAL FINDINGS Osseous: There is no definite acute facial bone fracture, though image quality is markedly degraded by motion artifact. No layering blood is seen in the paranasal sinuses. There is degenerative change of the left temporomandibular joint. Orbits: A left lens implant is noted. The globes are intact. There is no definite acute injury to the globes or orbits. Sinuses: A retention cyst is again noted in the left maxillary sinus. Soft tissues: Unremarkable. CT CERVICAL SPINE FINDINGS Alignment: There is no antero or retrolisthesis. There is no jumped or perched facet or other evidence of traumatic malalignment. Skull base and vertebrae: Skull base alignment appears maintained, within the confines of motion degraded images. There is no definite evidence of acute fracture. Postsurgical changes reflecting C3-C4 and C4 through C7 ACDF are again noted. There is no definite evidence of hardware related complication. Soft tissues and spinal canal: No prevertebral fluid or swelling. No definite canal hematoma. Disc levels: Facet arthropathy most advanced on the left at C3-C4 is unchanged. Mild adjacent segment disease at C2-C3 and C7-T1 is stable. Upper chest: The imaged lung apices are clear. Other: The enlarged right thyroid lobe is unchanged. Thyroid ultrasound has previously been recommended. IMPRESSION: 1. Significantly motion degraded study. 2. Within this confine, no definite acute intracranial pathology,  acute facial bone fracture, or acute injury in the cervical spine. If there is high clinical concern, repeat imaging is recommended. 3. Right frontal scalp swelling. Electronically Signed   By: Lesia Hausen M.D.   On: 01/14/2023 09:48   CT Maxillofacial Wo  Contrast  Result Date: 01/14/2023 CLINICAL DATA:  Fall out of wheelchair. EXAM: CT HEAD WITHOUT CONTRAST CT MAXILLOFACIAL WITHOUT CONTRAST CT CERVICAL SPINE WITHOUT CONTRAST TECHNIQUE: Multidetector CT imaging of the head, cervical spine, and maxillofacial structures were performed using the standard protocol without intravenous contrast. Multiplanar CT image reconstructions of the cervical spine and maxillofacial structures were also generated. RADIATION DOSE REDUCTION: This exam was performed according to the departmental dose-optimization program which includes automated exposure control, adjustment of the mA and/or kV according to patient size and/or use of iterative reconstruction technique. COMPARISON:  CT head, facial bones, and cervical spine 01/06/2023 FINDINGS: Image quality is significantly degraded by motion artifact. CT HEAD FINDINGS Brain: There is no definite evidence of acute intracranial hemorrhage, extra-axial fluid collection, or acute infarct, though as above image quality is significantly degraded by motion artifact. Parenchymal volume is stable. The ventricles are stable in size. There is no midline shift. Vascular: No hyperdense vessel or unexpected calcification. Skull: There is no definite calvarial fracture. Other: There is right frontal scalp swelling. CT MAXILLOFACIAL FINDINGS Osseous: There is no definite acute facial bone fracture, though image quality is markedly degraded by motion artifact. No layering blood is seen in the paranasal sinuses. There is degenerative change of the left temporomandibular joint. Orbits: A left lens implant is noted. The globes are intact. There is no definite acute injury to the globes or orbits.  Sinuses: A retention cyst is again noted in the left maxillary sinus. Soft tissues: Unremarkable. CT CERVICAL SPINE FINDINGS Alignment: There is no antero or retrolisthesis. There is no jumped or perched facet or other evidence of traumatic malalignment. Skull base and vertebrae: Skull base alignment appears maintained, within the confines of motion degraded images. There is no definite evidence of acute fracture. Postsurgical changes reflecting C3-C4 and C4 through C7 ACDF are again noted. There is no definite evidence of hardware related complication. Soft tissues and spinal canal: No prevertebral fluid or swelling. No definite canal hematoma. Disc levels: Facet arthropathy most advanced on the left at C3-C4 is unchanged. Mild adjacent segment disease at C2-C3 and C7-T1 is stable. Upper chest: The imaged lung apices are clear. Other: The enlarged right thyroid lobe is unchanged. Thyroid ultrasound has previously been recommended. IMPRESSION: 1. Significantly motion degraded study. 2. Within this confine, no definite acute intracranial pathology, acute facial bone fracture, or acute injury in the cervical spine. If there is high clinical concern, repeat imaging is recommended. 3. Right frontal scalp swelling. Electronically Signed   By: Lesia Hausen M.D.   On: 01/14/2023 09:48   DG Pelvis 1-2 Views  Result Date: 01/14/2023 CLINICAL DATA:  Fall, generalized hip pain. no focal ttp, moves legs EXAM: PELVIS - 1-2 VIEW COMPARISON:  None Available. FINDINGS: There is no evidence of pelvic fracture or diastasis. No pelvic bone lesions are seen. Lower lumbar postsurgical changes, partially imaged. IMPRESSION: Negative. Electronically Signed   By: Feliberto Harts M.D.   On: 01/14/2023 09:03    Procedures Procedures    Medications Ordered in ED Medications  acetaminophen (TYLENOL) tablet 1,000 mg (1,000 mg Oral Given 01/14/23 0803)  LORazepam (ATIVAN) tablet 1 mg (1 mg Oral Given 01/14/23 0805)    ED Course/  Medical Decision Making/ A&P Clinical Course as of 01/14/23 1039  Wed Jan 14, 2023  0659 Fall this AM 15 min of laying on ground.  [WF]    Clinical Course User Index [WF] Gailen Shelter, [place]  Medical Decision Making Amount and/or Complexity of Data Reviewed Labs: ordered. Radiology: ordered.  Risk OTC drugs. Prescription drug management.   Patient is a 77 year old female with past medical history significant for scoliosis of the spine, DM2, HTN, HLD, depression, some cognitive disability  Patient presents emergency room today after fall out of wheelchair.  She states that she does not actually recall the events leading up to this.  She states that she more or less woke up on the ground in front of her wheelchair.  She states she has some facial pain and some neck discomfort but otherwise feels well.  Denies any extremity pain.  States that she did not suffer any lacerations and has not had any bleeding.  No nausea or vomiting.  She denies any chest pain or difficulty breathing.  She does take chronic steroids due to her rheumatoid arthritis.  Into the anticoagulation.  Does take aspirin as antiplatelets.  Creatinine at baseline, hemoglobin at baseline.  Labs unremarkable  I ordered CT head, CT C-spine, CT max face, plain x-rays of pelvis 2 view  EKG non-ischemic.   Physical exam notable for diffuse significant bruising across the face but no focal bony tenderness.  Teeth are aligned, no nasal septal hematomas.  Normal vital signs no upper or lower extremity bony tenderness.  Pelvic/hip x-ray without fracture.  CT head, C-spine, max face unremarkable  I discussed this case with my attending physician who cosigned this note including patient's presenting symptoms, physical exam, and planned diagnostics and interventions. Attending physician stated agreement with plan or made changes to plan which were implemented.   Attending physician assessed patient  at bedside.  Will discharge home at this time.  Return precautions discussed.  She will do physical therapy at independent living facility.  Final Clinical Impression(s) / ED Diagnoses Final diagnoses:  Fall, initial encounter  Contusion of face, initial encounter    Rx / DC Orders ED Discharge Orders     None         Gailen Shelter, [place] 01/14/23 1040    Jacalyn Lefevre, MD 01/14/23 [phone]

## 2023-01-16 DIAGNOSIS — Z466 Encounter for fitting and adjustment of urinary device: Secondary | ICD-10-CM | POA: Diagnosis not present

## 2023-01-16 DIAGNOSIS — D631 Anemia in chronic kidney disease: Secondary | ICD-10-CM | POA: Diagnosis not present

## 2023-01-16 DIAGNOSIS — S81802D Unspecified open wound, left lower leg, subsequent encounter: Secondary | ICD-10-CM | POA: Diagnosis not present

## 2023-01-16 DIAGNOSIS — G3184 Mild cognitive impairment, so stated: Secondary | ICD-10-CM | POA: Diagnosis not present

## 2023-01-16 DIAGNOSIS — E039 Hypothyroidism, unspecified: Secondary | ICD-10-CM | POA: Diagnosis not present

## 2023-01-16 DIAGNOSIS — Z7952 Long term (current) use of systemic steroids: Secondary | ICD-10-CM | POA: Diagnosis not present

## 2023-01-16 DIAGNOSIS — E785 Hyperlipidemia, unspecified: Secondary | ICD-10-CM | POA: Diagnosis not present

## 2023-01-16 DIAGNOSIS — G4700 Insomnia, unspecified: Secondary | ICD-10-CM | POA: Diagnosis not present

## 2023-01-16 DIAGNOSIS — E1151 Type 2 diabetes mellitus with diabetic peripheral angiopathy without gangrene: Secondary | ICD-10-CM | POA: Diagnosis not present

## 2023-01-16 DIAGNOSIS — K5900 Constipation, unspecified: Secondary | ICD-10-CM | POA: Diagnosis not present

## 2023-01-16 DIAGNOSIS — G894 Chronic pain syndrome: Secondary | ICD-10-CM | POA: Diagnosis not present

## 2023-01-16 DIAGNOSIS — N1831 Chronic kidney disease, stage 3a: Secondary | ICD-10-CM | POA: Diagnosis not present

## 2023-01-16 DIAGNOSIS — E559 Vitamin D deficiency, unspecified: Secondary | ICD-10-CM | POA: Diagnosis not present

## 2023-01-16 DIAGNOSIS — M069 Rheumatoid arthritis, unspecified: Secondary | ICD-10-CM | POA: Diagnosis not present

## 2023-01-16 DIAGNOSIS — E1122 Type 2 diabetes mellitus with diabetic chronic kidney disease: Secondary | ICD-10-CM | POA: Diagnosis not present

## 2023-01-16 DIAGNOSIS — J309 Allergic rhinitis, unspecified: Secondary | ICD-10-CM | POA: Diagnosis not present

## 2023-01-16 DIAGNOSIS — Z993 Dependence on wheelchair: Secondary | ICD-10-CM | POA: Diagnosis not present

## 2023-01-16 DIAGNOSIS — I129 Hypertensive chronic kidney disease with stage 1 through stage 4 chronic kidney disease, or unspecified chronic kidney disease: Secondary | ICD-10-CM | POA: Diagnosis not present

## 2023-01-16 DIAGNOSIS — Z7982 Long term (current) use of aspirin: Secondary | ICD-10-CM | POA: Diagnosis not present

## 2023-01-16 DIAGNOSIS — M4800 Spinal stenosis, site unspecified: Secondary | ICD-10-CM | POA: Diagnosis not present

## 2023-01-20 DIAGNOSIS — M4800 Spinal stenosis, site unspecified: Secondary | ICD-10-CM | POA: Diagnosis not present

## 2023-01-20 DIAGNOSIS — D631 Anemia in chronic kidney disease: Secondary | ICD-10-CM | POA: Diagnosis not present

## 2023-01-20 DIAGNOSIS — E1151 Type 2 diabetes mellitus with diabetic peripheral angiopathy without gangrene: Secondary | ICD-10-CM | POA: Diagnosis not present

## 2023-01-20 DIAGNOSIS — G3184 Mild cognitive impairment, so stated: Secondary | ICD-10-CM | POA: Diagnosis not present

## 2023-01-20 DIAGNOSIS — G894 Chronic pain syndrome: Secondary | ICD-10-CM | POA: Diagnosis not present

## 2023-01-20 DIAGNOSIS — E1122 Type 2 diabetes mellitus with diabetic chronic kidney disease: Secondary | ICD-10-CM | POA: Diagnosis not present

## 2023-01-20 DIAGNOSIS — Z993 Dependence on wheelchair: Secondary | ICD-10-CM | POA: Diagnosis not present

## 2023-01-20 DIAGNOSIS — Z466 Encounter for fitting and adjustment of urinary device: Secondary | ICD-10-CM | POA: Diagnosis not present

## 2023-01-20 DIAGNOSIS — J309 Allergic rhinitis, unspecified: Secondary | ICD-10-CM | POA: Diagnosis not present

## 2023-01-20 DIAGNOSIS — E785 Hyperlipidemia, unspecified: Secondary | ICD-10-CM | POA: Diagnosis not present

## 2023-01-20 DIAGNOSIS — Z7952 Long term (current) use of systemic steroids: Secondary | ICD-10-CM | POA: Diagnosis not present

## 2023-01-20 DIAGNOSIS — S81802D Unspecified open wound, left lower leg, subsequent encounter: Secondary | ICD-10-CM | POA: Diagnosis not present

## 2023-01-20 DIAGNOSIS — E039 Hypothyroidism, unspecified: Secondary | ICD-10-CM | POA: Diagnosis not present

## 2023-01-20 DIAGNOSIS — G4700 Insomnia, unspecified: Secondary | ICD-10-CM | POA: Diagnosis not present

## 2023-01-20 DIAGNOSIS — N1831 Chronic kidney disease, stage 3a: Secondary | ICD-10-CM | POA: Diagnosis not present

## 2023-01-20 DIAGNOSIS — E559 Vitamin D deficiency, unspecified: Secondary | ICD-10-CM | POA: Diagnosis not present

## 2023-01-20 DIAGNOSIS — Z7982 Long term (current) use of aspirin: Secondary | ICD-10-CM | POA: Diagnosis not present

## 2023-01-20 DIAGNOSIS — K5900 Constipation, unspecified: Secondary | ICD-10-CM | POA: Diagnosis not present

## 2023-01-20 DIAGNOSIS — M069 Rheumatoid arthritis, unspecified: Secondary | ICD-10-CM | POA: Diagnosis not present

## 2023-01-20 DIAGNOSIS — I129 Hypertensive chronic kidney disease with stage 1 through stage 4 chronic kidney disease, or unspecified chronic kidney disease: Secondary | ICD-10-CM | POA: Diagnosis not present

## 2023-01-21 DIAGNOSIS — I129 Hypertensive chronic kidney disease with stage 1 through stage 4 chronic kidney disease, or unspecified chronic kidney disease: Secondary | ICD-10-CM | POA: Diagnosis not present

## 2023-01-21 DIAGNOSIS — J309 Allergic rhinitis, unspecified: Secondary | ICD-10-CM | POA: Diagnosis not present

## 2023-01-21 DIAGNOSIS — S81802D Unspecified open wound, left lower leg, subsequent encounter: Secondary | ICD-10-CM | POA: Diagnosis not present

## 2023-01-21 DIAGNOSIS — E1122 Type 2 diabetes mellitus with diabetic chronic kidney disease: Secondary | ICD-10-CM | POA: Diagnosis not present

## 2023-01-21 DIAGNOSIS — Z7982 Long term (current) use of aspirin: Secondary | ICD-10-CM | POA: Diagnosis not present

## 2023-01-21 DIAGNOSIS — D631 Anemia in chronic kidney disease: Secondary | ICD-10-CM | POA: Diagnosis not present

## 2023-01-21 DIAGNOSIS — Z466 Encounter for fitting and adjustment of urinary device: Secondary | ICD-10-CM | POA: Diagnosis not present

## 2023-01-21 DIAGNOSIS — K5900 Constipation, unspecified: Secondary | ICD-10-CM | POA: Diagnosis not present

## 2023-01-21 DIAGNOSIS — G894 Chronic pain syndrome: Secondary | ICD-10-CM | POA: Diagnosis not present

## 2023-01-21 DIAGNOSIS — E1151 Type 2 diabetes mellitus with diabetic peripheral angiopathy without gangrene: Secondary | ICD-10-CM | POA: Diagnosis not present

## 2023-01-21 DIAGNOSIS — Z993 Dependence on wheelchair: Secondary | ICD-10-CM | POA: Diagnosis not present

## 2023-01-21 DIAGNOSIS — E559 Vitamin D deficiency, unspecified: Secondary | ICD-10-CM | POA: Diagnosis not present

## 2023-01-21 DIAGNOSIS — G4700 Insomnia, unspecified: Secondary | ICD-10-CM | POA: Diagnosis not present

## 2023-01-21 DIAGNOSIS — M4800 Spinal stenosis, site unspecified: Secondary | ICD-10-CM | POA: Diagnosis not present

## 2023-01-21 DIAGNOSIS — E785 Hyperlipidemia, unspecified: Secondary | ICD-10-CM | POA: Diagnosis not present

## 2023-01-21 DIAGNOSIS — E039 Hypothyroidism, unspecified: Secondary | ICD-10-CM | POA: Diagnosis not present

## 2023-01-21 DIAGNOSIS — N1831 Chronic kidney disease, stage 3a: Secondary | ICD-10-CM | POA: Diagnosis not present

## 2023-01-21 DIAGNOSIS — Z7952 Long term (current) use of systemic steroids: Secondary | ICD-10-CM | POA: Diagnosis not present

## 2023-01-21 DIAGNOSIS — M069 Rheumatoid arthritis, unspecified: Secondary | ICD-10-CM | POA: Diagnosis not present

## 2023-01-21 DIAGNOSIS — G3184 Mild cognitive impairment, so stated: Secondary | ICD-10-CM | POA: Diagnosis not present

## 2023-01-23 DIAGNOSIS — G894 Chronic pain syndrome: Secondary | ICD-10-CM | POA: Diagnosis not present

## 2023-01-23 DIAGNOSIS — E559 Vitamin D deficiency, unspecified: Secondary | ICD-10-CM | POA: Diagnosis not present

## 2023-01-23 DIAGNOSIS — Z7982 Long term (current) use of aspirin: Secondary | ICD-10-CM | POA: Diagnosis not present

## 2023-01-23 DIAGNOSIS — D631 Anemia in chronic kidney disease: Secondary | ICD-10-CM | POA: Diagnosis not present

## 2023-01-23 DIAGNOSIS — M069 Rheumatoid arthritis, unspecified: Secondary | ICD-10-CM | POA: Diagnosis not present

## 2023-01-23 DIAGNOSIS — K5900 Constipation, unspecified: Secondary | ICD-10-CM | POA: Diagnosis not present

## 2023-01-23 DIAGNOSIS — Z466 Encounter for fitting and adjustment of urinary device: Secondary | ICD-10-CM | POA: Diagnosis not present

## 2023-01-23 DIAGNOSIS — E039 Hypothyroidism, unspecified: Secondary | ICD-10-CM | POA: Diagnosis not present

## 2023-01-23 DIAGNOSIS — E785 Hyperlipidemia, unspecified: Secondary | ICD-10-CM | POA: Diagnosis not present

## 2023-01-23 DIAGNOSIS — N1831 Chronic kidney disease, stage 3a: Secondary | ICD-10-CM | POA: Diagnosis not present

## 2023-01-23 DIAGNOSIS — Z7952 Long term (current) use of systemic steroids: Secondary | ICD-10-CM | POA: Diagnosis not present

## 2023-01-23 DIAGNOSIS — G3184 Mild cognitive impairment, so stated: Secondary | ICD-10-CM | POA: Diagnosis not present

## 2023-01-23 DIAGNOSIS — G4700 Insomnia, unspecified: Secondary | ICD-10-CM | POA: Diagnosis not present

## 2023-01-23 DIAGNOSIS — E1151 Type 2 diabetes mellitus with diabetic peripheral angiopathy without gangrene: Secondary | ICD-10-CM | POA: Diagnosis not present

## 2023-01-23 DIAGNOSIS — S81802D Unspecified open wound, left lower leg, subsequent encounter: Secondary | ICD-10-CM | POA: Diagnosis not present

## 2023-01-23 DIAGNOSIS — M4800 Spinal stenosis, site unspecified: Secondary | ICD-10-CM | POA: Diagnosis not present

## 2023-01-23 DIAGNOSIS — I129 Hypertensive chronic kidney disease with stage 1 through stage 4 chronic kidney disease, or unspecified chronic kidney disease: Secondary | ICD-10-CM | POA: Diagnosis not present

## 2023-01-23 DIAGNOSIS — Z993 Dependence on wheelchair: Secondary | ICD-10-CM | POA: Diagnosis not present

## 2023-01-23 DIAGNOSIS — J309 Allergic rhinitis, unspecified: Secondary | ICD-10-CM | POA: Diagnosis not present

## 2023-01-23 DIAGNOSIS — E1122 Type 2 diabetes mellitus with diabetic chronic kidney disease: Secondary | ICD-10-CM | POA: Diagnosis not present

## 2023-01-29 DIAGNOSIS — D631 Anemia in chronic kidney disease: Secondary | ICD-10-CM | POA: Diagnosis not present

## 2023-01-29 DIAGNOSIS — N1831 Chronic kidney disease, stage 3a: Secondary | ICD-10-CM | POA: Diagnosis not present

## 2023-01-29 DIAGNOSIS — Z7952 Long term (current) use of systemic steroids: Secondary | ICD-10-CM | POA: Diagnosis not present

## 2023-01-29 DIAGNOSIS — E039 Hypothyroidism, unspecified: Secondary | ICD-10-CM | POA: Diagnosis not present

## 2023-01-29 DIAGNOSIS — M4800 Spinal stenosis, site unspecified: Secondary | ICD-10-CM | POA: Diagnosis not present

## 2023-01-29 DIAGNOSIS — E785 Hyperlipidemia, unspecified: Secondary | ICD-10-CM | POA: Diagnosis not present

## 2023-01-29 DIAGNOSIS — M069 Rheumatoid arthritis, unspecified: Secondary | ICD-10-CM | POA: Diagnosis not present

## 2023-01-29 DIAGNOSIS — G894 Chronic pain syndrome: Secondary | ICD-10-CM | POA: Diagnosis not present

## 2023-01-29 DIAGNOSIS — G3184 Mild cognitive impairment, so stated: Secondary | ICD-10-CM | POA: Diagnosis not present

## 2023-01-29 DIAGNOSIS — E559 Vitamin D deficiency, unspecified: Secondary | ICD-10-CM | POA: Diagnosis not present

## 2023-01-29 DIAGNOSIS — E1122 Type 2 diabetes mellitus with diabetic chronic kidney disease: Secondary | ICD-10-CM | POA: Diagnosis not present

## 2023-01-29 DIAGNOSIS — Z7982 Long term (current) use of aspirin: Secondary | ICD-10-CM | POA: Diagnosis not present

## 2023-01-29 DIAGNOSIS — E1151 Type 2 diabetes mellitus with diabetic peripheral angiopathy without gangrene: Secondary | ICD-10-CM | POA: Diagnosis not present

## 2023-01-29 DIAGNOSIS — G4700 Insomnia, unspecified: Secondary | ICD-10-CM | POA: Diagnosis not present

## 2023-01-29 DIAGNOSIS — Z993 Dependence on wheelchair: Secondary | ICD-10-CM | POA: Diagnosis not present

## 2023-01-29 DIAGNOSIS — S81802D Unspecified open wound, left lower leg, subsequent encounter: Secondary | ICD-10-CM | POA: Diagnosis not present

## 2023-01-29 DIAGNOSIS — K5900 Constipation, unspecified: Secondary | ICD-10-CM | POA: Diagnosis not present

## 2023-01-29 DIAGNOSIS — J309 Allergic rhinitis, unspecified: Secondary | ICD-10-CM | POA: Diagnosis not present

## 2023-01-29 DIAGNOSIS — I129 Hypertensive chronic kidney disease with stage 1 through stage 4 chronic kidney disease, or unspecified chronic kidney disease: Secondary | ICD-10-CM | POA: Diagnosis not present

## 2023-01-29 DIAGNOSIS — Z466 Encounter for fitting and adjustment of urinary device: Secondary | ICD-10-CM | POA: Diagnosis not present

## 2023-02-02 DIAGNOSIS — Z7952 Long term (current) use of systemic steroids: Secondary | ICD-10-CM | POA: Diagnosis not present

## 2023-02-02 DIAGNOSIS — Z993 Dependence on wheelchair: Secondary | ICD-10-CM | POA: Diagnosis not present

## 2023-02-02 DIAGNOSIS — E1122 Type 2 diabetes mellitus with diabetic chronic kidney disease: Secondary | ICD-10-CM | POA: Diagnosis not present

## 2023-02-02 DIAGNOSIS — N1831 Chronic kidney disease, stage 3a: Secondary | ICD-10-CM | POA: Diagnosis not present

## 2023-02-02 DIAGNOSIS — E559 Vitamin D deficiency, unspecified: Secondary | ICD-10-CM | POA: Diagnosis not present

## 2023-02-02 DIAGNOSIS — S81802D Unspecified open wound, left lower leg, subsequent encounter: Secondary | ICD-10-CM | POA: Diagnosis not present

## 2023-02-02 DIAGNOSIS — Z7982 Long term (current) use of aspirin: Secondary | ICD-10-CM | POA: Diagnosis not present

## 2023-02-02 DIAGNOSIS — G8222 Paraplegia, incomplete: Secondary | ICD-10-CM | POA: Diagnosis not present

## 2023-02-02 DIAGNOSIS — H1031 Unspecified acute conjunctivitis, right eye: Secondary | ICD-10-CM | POA: Diagnosis not present

## 2023-02-02 DIAGNOSIS — D631 Anemia in chronic kidney disease: Secondary | ICD-10-CM | POA: Diagnosis not present

## 2023-02-02 DIAGNOSIS — J309 Allergic rhinitis, unspecified: Secondary | ICD-10-CM | POA: Diagnosis not present

## 2023-02-02 DIAGNOSIS — I129 Hypertensive chronic kidney disease with stage 1 through stage 4 chronic kidney disease, or unspecified chronic kidney disease: Secondary | ICD-10-CM | POA: Diagnosis not present

## 2023-02-02 DIAGNOSIS — E1151 Type 2 diabetes mellitus with diabetic peripheral angiopathy without gangrene: Secondary | ICD-10-CM | POA: Diagnosis not present

## 2023-02-02 DIAGNOSIS — E039 Hypothyroidism, unspecified: Secondary | ICD-10-CM | POA: Diagnosis not present

## 2023-02-02 DIAGNOSIS — M069 Rheumatoid arthritis, unspecified: Secondary | ICD-10-CM | POA: Diagnosis not present

## 2023-02-02 DIAGNOSIS — G4700 Insomnia, unspecified: Secondary | ICD-10-CM | POA: Diagnosis not present

## 2023-02-02 DIAGNOSIS — G3184 Mild cognitive impairment, so stated: Secondary | ICD-10-CM | POA: Diagnosis not present

## 2023-02-02 DIAGNOSIS — G894 Chronic pain syndrome: Secondary | ICD-10-CM | POA: Diagnosis not present

## 2023-02-02 DIAGNOSIS — E785 Hyperlipidemia, unspecified: Secondary | ICD-10-CM | POA: Diagnosis not present

## 2023-02-02 DIAGNOSIS — K5900 Constipation, unspecified: Secondary | ICD-10-CM | POA: Diagnosis not present

## 2023-02-02 DIAGNOSIS — M4800 Spinal stenosis, site unspecified: Secondary | ICD-10-CM | POA: Diagnosis not present

## 2023-02-02 DIAGNOSIS — Z466 Encounter for fitting and adjustment of urinary device: Secondary | ICD-10-CM | POA: Diagnosis not present

## 2023-02-02 DIAGNOSIS — B372 Candidiasis of skin and nail: Secondary | ICD-10-CM | POA: Diagnosis not present

## 2023-02-03 DIAGNOSIS — J309 Allergic rhinitis, unspecified: Secondary | ICD-10-CM | POA: Diagnosis not present

## 2023-02-03 DIAGNOSIS — G894 Chronic pain syndrome: Secondary | ICD-10-CM | POA: Diagnosis not present

## 2023-02-03 DIAGNOSIS — E785 Hyperlipidemia, unspecified: Secondary | ICD-10-CM | POA: Diagnosis not present

## 2023-02-03 DIAGNOSIS — Z7952 Long term (current) use of systemic steroids: Secondary | ICD-10-CM | POA: Diagnosis not present

## 2023-02-03 DIAGNOSIS — E559 Vitamin D deficiency, unspecified: Secondary | ICD-10-CM | POA: Diagnosis not present

## 2023-02-03 DIAGNOSIS — S81802D Unspecified open wound, left lower leg, subsequent encounter: Secondary | ICD-10-CM | POA: Diagnosis not present

## 2023-02-03 DIAGNOSIS — N1831 Chronic kidney disease, stage 3a: Secondary | ICD-10-CM | POA: Diagnosis not present

## 2023-02-03 DIAGNOSIS — M069 Rheumatoid arthritis, unspecified: Secondary | ICD-10-CM | POA: Diagnosis not present

## 2023-02-03 DIAGNOSIS — M4800 Spinal stenosis, site unspecified: Secondary | ICD-10-CM | POA: Diagnosis not present

## 2023-02-03 DIAGNOSIS — Z7982 Long term (current) use of aspirin: Secondary | ICD-10-CM | POA: Diagnosis not present

## 2023-02-03 DIAGNOSIS — I129 Hypertensive chronic kidney disease with stage 1 through stage 4 chronic kidney disease, or unspecified chronic kidney disease: Secondary | ICD-10-CM | POA: Diagnosis not present

## 2023-02-03 DIAGNOSIS — E1151 Type 2 diabetes mellitus with diabetic peripheral angiopathy without gangrene: Secondary | ICD-10-CM | POA: Diagnosis not present

## 2023-02-03 DIAGNOSIS — E1122 Type 2 diabetes mellitus with diabetic chronic kidney disease: Secondary | ICD-10-CM | POA: Diagnosis not present

## 2023-02-03 DIAGNOSIS — D631 Anemia in chronic kidney disease: Secondary | ICD-10-CM | POA: Diagnosis not present

## 2023-02-03 DIAGNOSIS — G4700 Insomnia, unspecified: Secondary | ICD-10-CM | POA: Diagnosis not present

## 2023-02-03 DIAGNOSIS — Z993 Dependence on wheelchair: Secondary | ICD-10-CM | POA: Diagnosis not present

## 2023-02-03 DIAGNOSIS — G3184 Mild cognitive impairment, so stated: Secondary | ICD-10-CM | POA: Diagnosis not present

## 2023-02-03 DIAGNOSIS — K5900 Constipation, unspecified: Secondary | ICD-10-CM | POA: Diagnosis not present

## 2023-02-03 DIAGNOSIS — E039 Hypothyroidism, unspecified: Secondary | ICD-10-CM | POA: Diagnosis not present

## 2023-02-03 DIAGNOSIS — Z466 Encounter for fitting and adjustment of urinary device: Secondary | ICD-10-CM | POA: Diagnosis not present

## 2023-02-05 DIAGNOSIS — E785 Hyperlipidemia, unspecified: Secondary | ICD-10-CM | POA: Diagnosis not present

## 2023-02-05 DIAGNOSIS — M069 Rheumatoid arthritis, unspecified: Secondary | ICD-10-CM | POA: Diagnosis not present

## 2023-02-05 DIAGNOSIS — N1831 Chronic kidney disease, stage 3a: Secondary | ICD-10-CM | POA: Diagnosis not present

## 2023-02-05 DIAGNOSIS — E1151 Type 2 diabetes mellitus with diabetic peripheral angiopathy without gangrene: Secondary | ICD-10-CM | POA: Diagnosis not present

## 2023-02-05 DIAGNOSIS — Z7982 Long term (current) use of aspirin: Secondary | ICD-10-CM | POA: Diagnosis not present

## 2023-02-05 DIAGNOSIS — I129 Hypertensive chronic kidney disease with stage 1 through stage 4 chronic kidney disease, or unspecified chronic kidney disease: Secondary | ICD-10-CM | POA: Diagnosis not present

## 2023-02-05 DIAGNOSIS — M4800 Spinal stenosis, site unspecified: Secondary | ICD-10-CM | POA: Diagnosis not present

## 2023-02-05 DIAGNOSIS — Z7952 Long term (current) use of systemic steroids: Secondary | ICD-10-CM | POA: Diagnosis not present

## 2023-02-05 DIAGNOSIS — Z993 Dependence on wheelchair: Secondary | ICD-10-CM | POA: Diagnosis not present

## 2023-02-05 DIAGNOSIS — J309 Allergic rhinitis, unspecified: Secondary | ICD-10-CM | POA: Diagnosis not present

## 2023-02-05 DIAGNOSIS — G4700 Insomnia, unspecified: Secondary | ICD-10-CM | POA: Diagnosis not present

## 2023-02-05 DIAGNOSIS — E039 Hypothyroidism, unspecified: Secondary | ICD-10-CM | POA: Diagnosis not present

## 2023-02-05 DIAGNOSIS — G894 Chronic pain syndrome: Secondary | ICD-10-CM | POA: Diagnosis not present

## 2023-02-05 DIAGNOSIS — K5900 Constipation, unspecified: Secondary | ICD-10-CM | POA: Diagnosis not present

## 2023-02-05 DIAGNOSIS — S81802D Unspecified open wound, left lower leg, subsequent encounter: Secondary | ICD-10-CM | POA: Diagnosis not present

## 2023-02-05 DIAGNOSIS — E559 Vitamin D deficiency, unspecified: Secondary | ICD-10-CM | POA: Diagnosis not present

## 2023-02-05 DIAGNOSIS — G3184 Mild cognitive impairment, so stated: Secondary | ICD-10-CM | POA: Diagnosis not present

## 2023-02-05 DIAGNOSIS — Z466 Encounter for fitting and adjustment of urinary device: Secondary | ICD-10-CM | POA: Diagnosis not present

## 2023-02-05 DIAGNOSIS — D631 Anemia in chronic kidney disease: Secondary | ICD-10-CM | POA: Diagnosis not present

## 2023-02-05 DIAGNOSIS — E1122 Type 2 diabetes mellitus with diabetic chronic kidney disease: Secondary | ICD-10-CM | POA: Diagnosis not present

## 2023-02-06 DIAGNOSIS — G894 Chronic pain syndrome: Secondary | ICD-10-CM | POA: Diagnosis not present

## 2023-02-06 DIAGNOSIS — G4700 Insomnia, unspecified: Secondary | ICD-10-CM | POA: Diagnosis not present

## 2023-02-06 DIAGNOSIS — K5900 Constipation, unspecified: Secondary | ICD-10-CM | POA: Diagnosis not present

## 2023-02-06 DIAGNOSIS — S81802D Unspecified open wound, left lower leg, subsequent encounter: Secondary | ICD-10-CM | POA: Diagnosis not present

## 2023-02-06 DIAGNOSIS — E039 Hypothyroidism, unspecified: Secondary | ICD-10-CM | POA: Diagnosis not present

## 2023-02-06 DIAGNOSIS — N1831 Chronic kidney disease, stage 3a: Secondary | ICD-10-CM | POA: Diagnosis not present

## 2023-02-06 DIAGNOSIS — Z993 Dependence on wheelchair: Secondary | ICD-10-CM | POA: Diagnosis not present

## 2023-02-06 DIAGNOSIS — E785 Hyperlipidemia, unspecified: Secondary | ICD-10-CM | POA: Diagnosis not present

## 2023-02-06 DIAGNOSIS — I129 Hypertensive chronic kidney disease with stage 1 through stage 4 chronic kidney disease, or unspecified chronic kidney disease: Secondary | ICD-10-CM | POA: Diagnosis not present

## 2023-02-06 DIAGNOSIS — M069 Rheumatoid arthritis, unspecified: Secondary | ICD-10-CM | POA: Diagnosis not present

## 2023-02-06 DIAGNOSIS — M4800 Spinal stenosis, site unspecified: Secondary | ICD-10-CM | POA: Diagnosis not present

## 2023-02-06 DIAGNOSIS — G3184 Mild cognitive impairment, so stated: Secondary | ICD-10-CM | POA: Diagnosis not present

## 2023-02-06 DIAGNOSIS — Z7952 Long term (current) use of systemic steroids: Secondary | ICD-10-CM | POA: Diagnosis not present

## 2023-02-06 DIAGNOSIS — E1151 Type 2 diabetes mellitus with diabetic peripheral angiopathy without gangrene: Secondary | ICD-10-CM | POA: Diagnosis not present

## 2023-02-06 DIAGNOSIS — J309 Allergic rhinitis, unspecified: Secondary | ICD-10-CM | POA: Diagnosis not present

## 2023-02-06 DIAGNOSIS — Z7982 Long term (current) use of aspirin: Secondary | ICD-10-CM | POA: Diagnosis not present

## 2023-02-06 DIAGNOSIS — E559 Vitamin D deficiency, unspecified: Secondary | ICD-10-CM | POA: Diagnosis not present

## 2023-02-06 DIAGNOSIS — Z466 Encounter for fitting and adjustment of urinary device: Secondary | ICD-10-CM | POA: Diagnosis not present

## 2023-02-06 DIAGNOSIS — D631 Anemia in chronic kidney disease: Secondary | ICD-10-CM | POA: Diagnosis not present

## 2023-02-06 DIAGNOSIS — E1122 Type 2 diabetes mellitus with diabetic chronic kidney disease: Secondary | ICD-10-CM | POA: Diagnosis not present

## 2023-02-09 DIAGNOSIS — G3184 Mild cognitive impairment, so stated: Secondary | ICD-10-CM | POA: Diagnosis not present

## 2023-02-09 DIAGNOSIS — Z466 Encounter for fitting and adjustment of urinary device: Secondary | ICD-10-CM | POA: Diagnosis not present

## 2023-02-09 DIAGNOSIS — Z993 Dependence on wheelchair: Secondary | ICD-10-CM | POA: Diagnosis not present

## 2023-02-09 DIAGNOSIS — I129 Hypertensive chronic kidney disease with stage 1 through stage 4 chronic kidney disease, or unspecified chronic kidney disease: Secondary | ICD-10-CM | POA: Diagnosis not present

## 2023-02-09 DIAGNOSIS — Z7982 Long term (current) use of aspirin: Secondary | ICD-10-CM | POA: Diagnosis not present

## 2023-02-09 DIAGNOSIS — K5900 Constipation, unspecified: Secondary | ICD-10-CM | POA: Diagnosis not present

## 2023-02-09 DIAGNOSIS — E1151 Type 2 diabetes mellitus with diabetic peripheral angiopathy without gangrene: Secondary | ICD-10-CM | POA: Diagnosis not present

## 2023-02-09 DIAGNOSIS — E785 Hyperlipidemia, unspecified: Secondary | ICD-10-CM | POA: Diagnosis not present

## 2023-02-09 DIAGNOSIS — M069 Rheumatoid arthritis, unspecified: Secondary | ICD-10-CM | POA: Diagnosis not present

## 2023-02-09 DIAGNOSIS — Z7952 Long term (current) use of systemic steroids: Secondary | ICD-10-CM | POA: Diagnosis not present

## 2023-02-09 DIAGNOSIS — G4700 Insomnia, unspecified: Secondary | ICD-10-CM | POA: Diagnosis not present

## 2023-02-09 DIAGNOSIS — S81802D Unspecified open wound, left lower leg, subsequent encounter: Secondary | ICD-10-CM | POA: Diagnosis not present

## 2023-02-09 DIAGNOSIS — G894 Chronic pain syndrome: Secondary | ICD-10-CM | POA: Diagnosis not present

## 2023-02-09 DIAGNOSIS — E559 Vitamin D deficiency, unspecified: Secondary | ICD-10-CM | POA: Diagnosis not present

## 2023-02-09 DIAGNOSIS — M4800 Spinal stenosis, site unspecified: Secondary | ICD-10-CM | POA: Diagnosis not present

## 2023-02-09 DIAGNOSIS — N1831 Chronic kidney disease, stage 3a: Secondary | ICD-10-CM | POA: Diagnosis not present

## 2023-02-09 DIAGNOSIS — J309 Allergic rhinitis, unspecified: Secondary | ICD-10-CM | POA: Diagnosis not present

## 2023-02-09 DIAGNOSIS — E039 Hypothyroidism, unspecified: Secondary | ICD-10-CM | POA: Diagnosis not present

## 2023-02-09 DIAGNOSIS — E1122 Type 2 diabetes mellitus with diabetic chronic kidney disease: Secondary | ICD-10-CM | POA: Diagnosis not present

## 2023-02-09 DIAGNOSIS — D631 Anemia in chronic kidney disease: Secondary | ICD-10-CM | POA: Diagnosis not present

## 2023-02-10 DIAGNOSIS — G4700 Insomnia, unspecified: Secondary | ICD-10-CM | POA: Diagnosis not present

## 2023-02-10 DIAGNOSIS — Z466 Encounter for fitting and adjustment of urinary device: Secondary | ICD-10-CM | POA: Diagnosis not present

## 2023-02-10 DIAGNOSIS — R300 Dysuria: Secondary | ICD-10-CM | POA: Diagnosis not present

## 2023-02-10 DIAGNOSIS — E1122 Type 2 diabetes mellitus with diabetic chronic kidney disease: Secondary | ICD-10-CM | POA: Diagnosis not present

## 2023-02-10 DIAGNOSIS — K5900 Constipation, unspecified: Secondary | ICD-10-CM | POA: Diagnosis not present

## 2023-02-10 DIAGNOSIS — N1831 Chronic kidney disease, stage 3a: Secondary | ICD-10-CM | POA: Diagnosis not present

## 2023-02-10 DIAGNOSIS — M4800 Spinal stenosis, site unspecified: Secondary | ICD-10-CM | POA: Diagnosis not present

## 2023-02-10 DIAGNOSIS — Z993 Dependence on wheelchair: Secondary | ICD-10-CM | POA: Diagnosis not present

## 2023-02-10 DIAGNOSIS — E785 Hyperlipidemia, unspecified: Secondary | ICD-10-CM | POA: Diagnosis not present

## 2023-02-10 DIAGNOSIS — Z7952 Long term (current) use of systemic steroids: Secondary | ICD-10-CM | POA: Diagnosis not present

## 2023-02-10 DIAGNOSIS — G3184 Mild cognitive impairment, so stated: Secondary | ICD-10-CM | POA: Diagnosis not present

## 2023-02-10 DIAGNOSIS — E559 Vitamin D deficiency, unspecified: Secondary | ICD-10-CM | POA: Diagnosis not present

## 2023-02-10 DIAGNOSIS — E1151 Type 2 diabetes mellitus with diabetic peripheral angiopathy without gangrene: Secondary | ICD-10-CM | POA: Diagnosis not present

## 2023-02-10 DIAGNOSIS — Z7982 Long term (current) use of aspirin: Secondary | ICD-10-CM | POA: Diagnosis not present

## 2023-02-10 DIAGNOSIS — J309 Allergic rhinitis, unspecified: Secondary | ICD-10-CM | POA: Diagnosis not present

## 2023-02-10 DIAGNOSIS — D631 Anemia in chronic kidney disease: Secondary | ICD-10-CM | POA: Diagnosis not present

## 2023-02-10 DIAGNOSIS — G894 Chronic pain syndrome: Secondary | ICD-10-CM | POA: Diagnosis not present

## 2023-02-10 DIAGNOSIS — I129 Hypertensive chronic kidney disease with stage 1 through stage 4 chronic kidney disease, or unspecified chronic kidney disease: Secondary | ICD-10-CM | POA: Diagnosis not present

## 2023-02-10 DIAGNOSIS — S81802D Unspecified open wound, left lower leg, subsequent encounter: Secondary | ICD-10-CM | POA: Diagnosis not present

## 2023-02-10 DIAGNOSIS — M069 Rheumatoid arthritis, unspecified: Secondary | ICD-10-CM | POA: Diagnosis not present

## 2023-02-10 DIAGNOSIS — E039 Hypothyroidism, unspecified: Secondary | ICD-10-CM | POA: Diagnosis not present

## 2023-02-10 DIAGNOSIS — R600 Localized edema: Secondary | ICD-10-CM | POA: Diagnosis not present

## 2023-02-11 DIAGNOSIS — M069 Rheumatoid arthritis, unspecified: Secondary | ICD-10-CM | POA: Diagnosis not present

## 2023-02-11 DIAGNOSIS — Z993 Dependence on wheelchair: Secondary | ICD-10-CM | POA: Diagnosis not present

## 2023-02-11 DIAGNOSIS — E559 Vitamin D deficiency, unspecified: Secondary | ICD-10-CM | POA: Diagnosis not present

## 2023-02-11 DIAGNOSIS — K5900 Constipation, unspecified: Secondary | ICD-10-CM | POA: Diagnosis not present

## 2023-02-11 DIAGNOSIS — G4700 Insomnia, unspecified: Secondary | ICD-10-CM | POA: Diagnosis not present

## 2023-02-11 DIAGNOSIS — Z7952 Long term (current) use of systemic steroids: Secondary | ICD-10-CM | POA: Diagnosis not present

## 2023-02-11 DIAGNOSIS — R82998 Other abnormal findings in urine: Secondary | ICD-10-CM | POA: Diagnosis not present

## 2023-02-11 DIAGNOSIS — R300 Dysuria: Secondary | ICD-10-CM | POA: Diagnosis not present

## 2023-02-11 DIAGNOSIS — N390 Urinary tract infection, site not specified: Secondary | ICD-10-CM | POA: Diagnosis not present

## 2023-02-11 DIAGNOSIS — D631 Anemia in chronic kidney disease: Secondary | ICD-10-CM | POA: Diagnosis not present

## 2023-02-11 DIAGNOSIS — E1122 Type 2 diabetes mellitus with diabetic chronic kidney disease: Secondary | ICD-10-CM | POA: Diagnosis not present

## 2023-02-11 DIAGNOSIS — E1151 Type 2 diabetes mellitus with diabetic peripheral angiopathy without gangrene: Secondary | ICD-10-CM | POA: Diagnosis not present

## 2023-02-11 DIAGNOSIS — N1831 Chronic kidney disease, stage 3a: Secondary | ICD-10-CM | POA: Diagnosis not present

## 2023-02-11 DIAGNOSIS — I129 Hypertensive chronic kidney disease with stage 1 through stage 4 chronic kidney disease, or unspecified chronic kidney disease: Secondary | ICD-10-CM | POA: Diagnosis not present

## 2023-02-11 DIAGNOSIS — M4800 Spinal stenosis, site unspecified: Secondary | ICD-10-CM | POA: Diagnosis not present

## 2023-02-11 DIAGNOSIS — S81802D Unspecified open wound, left lower leg, subsequent encounter: Secondary | ICD-10-CM | POA: Diagnosis not present

## 2023-02-11 DIAGNOSIS — E039 Hypothyroidism, unspecified: Secondary | ICD-10-CM | POA: Diagnosis not present

## 2023-02-11 DIAGNOSIS — N1832 Chronic kidney disease, stage 3b: Secondary | ICD-10-CM | POA: Diagnosis not present

## 2023-02-11 DIAGNOSIS — E785 Hyperlipidemia, unspecified: Secondary | ICD-10-CM | POA: Diagnosis not present

## 2023-02-11 DIAGNOSIS — J309 Allergic rhinitis, unspecified: Secondary | ICD-10-CM | POA: Diagnosis not present

## 2023-02-11 DIAGNOSIS — Z7982 Long term (current) use of aspirin: Secondary | ICD-10-CM | POA: Diagnosis not present

## 2023-02-11 DIAGNOSIS — G3184 Mild cognitive impairment, so stated: Secondary | ICD-10-CM | POA: Diagnosis not present

## 2023-02-11 DIAGNOSIS — I10 Essential (primary) hypertension: Secondary | ICD-10-CM | POA: Diagnosis not present

## 2023-02-11 DIAGNOSIS — Z466 Encounter for fitting and adjustment of urinary device: Secondary | ICD-10-CM | POA: Diagnosis not present

## 2023-02-11 DIAGNOSIS — G894 Chronic pain syndrome: Secondary | ICD-10-CM | POA: Diagnosis not present

## 2023-02-12 DIAGNOSIS — G4700 Insomnia, unspecified: Secondary | ICD-10-CM | POA: Diagnosis not present

## 2023-02-12 DIAGNOSIS — G894 Chronic pain syndrome: Secondary | ICD-10-CM | POA: Diagnosis not present

## 2023-02-12 DIAGNOSIS — Z993 Dependence on wheelchair: Secondary | ICD-10-CM | POA: Diagnosis not present

## 2023-02-12 DIAGNOSIS — S81802D Unspecified open wound, left lower leg, subsequent encounter: Secondary | ICD-10-CM | POA: Diagnosis not present

## 2023-02-12 DIAGNOSIS — G3184 Mild cognitive impairment, so stated: Secondary | ICD-10-CM | POA: Diagnosis not present

## 2023-02-12 DIAGNOSIS — Z7952 Long term (current) use of systemic steroids: Secondary | ICD-10-CM | POA: Diagnosis not present

## 2023-02-12 DIAGNOSIS — D631 Anemia in chronic kidney disease: Secondary | ICD-10-CM | POA: Diagnosis not present

## 2023-02-12 DIAGNOSIS — J309 Allergic rhinitis, unspecified: Secondary | ICD-10-CM | POA: Diagnosis not present

## 2023-02-12 DIAGNOSIS — E559 Vitamin D deficiency, unspecified: Secondary | ICD-10-CM | POA: Diagnosis not present

## 2023-02-12 DIAGNOSIS — M069 Rheumatoid arthritis, unspecified: Secondary | ICD-10-CM | POA: Diagnosis not present

## 2023-02-12 DIAGNOSIS — N1831 Chronic kidney disease, stage 3a: Secondary | ICD-10-CM | POA: Diagnosis not present

## 2023-02-12 DIAGNOSIS — M4800 Spinal stenosis, site unspecified: Secondary | ICD-10-CM | POA: Diagnosis not present

## 2023-02-12 DIAGNOSIS — E1122 Type 2 diabetes mellitus with diabetic chronic kidney disease: Secondary | ICD-10-CM | POA: Diagnosis not present

## 2023-02-12 DIAGNOSIS — E039 Hypothyroidism, unspecified: Secondary | ICD-10-CM | POA: Diagnosis not present

## 2023-02-12 DIAGNOSIS — E785 Hyperlipidemia, unspecified: Secondary | ICD-10-CM | POA: Diagnosis not present

## 2023-02-12 DIAGNOSIS — Z7982 Long term (current) use of aspirin: Secondary | ICD-10-CM | POA: Diagnosis not present

## 2023-02-12 DIAGNOSIS — I129 Hypertensive chronic kidney disease with stage 1 through stage 4 chronic kidney disease, or unspecified chronic kidney disease: Secondary | ICD-10-CM | POA: Diagnosis not present

## 2023-02-12 DIAGNOSIS — K5900 Constipation, unspecified: Secondary | ICD-10-CM | POA: Diagnosis not present

## 2023-02-12 DIAGNOSIS — Z466 Encounter for fitting and adjustment of urinary device: Secondary | ICD-10-CM | POA: Diagnosis not present

## 2023-02-12 DIAGNOSIS — E1151 Type 2 diabetes mellitus with diabetic peripheral angiopathy without gangrene: Secondary | ICD-10-CM | POA: Diagnosis not present

## 2023-02-13 DIAGNOSIS — E1122 Type 2 diabetes mellitus with diabetic chronic kidney disease: Secondary | ICD-10-CM | POA: Diagnosis not present

## 2023-02-13 DIAGNOSIS — E039 Hypothyroidism, unspecified: Secondary | ICD-10-CM | POA: Diagnosis not present

## 2023-02-13 DIAGNOSIS — G3184 Mild cognitive impairment, so stated: Secondary | ICD-10-CM | POA: Diagnosis not present

## 2023-02-13 DIAGNOSIS — J309 Allergic rhinitis, unspecified: Secondary | ICD-10-CM | POA: Diagnosis not present

## 2023-02-13 DIAGNOSIS — M069 Rheumatoid arthritis, unspecified: Secondary | ICD-10-CM | POA: Diagnosis not present

## 2023-02-13 DIAGNOSIS — G894 Chronic pain syndrome: Secondary | ICD-10-CM | POA: Diagnosis not present

## 2023-02-13 DIAGNOSIS — Z993 Dependence on wheelchair: Secondary | ICD-10-CM | POA: Diagnosis not present

## 2023-02-13 DIAGNOSIS — S81802D Unspecified open wound, left lower leg, subsequent encounter: Secondary | ICD-10-CM | POA: Diagnosis not present

## 2023-02-13 DIAGNOSIS — E1151 Type 2 diabetes mellitus with diabetic peripheral angiopathy without gangrene: Secondary | ICD-10-CM | POA: Diagnosis not present

## 2023-02-13 DIAGNOSIS — I129 Hypertensive chronic kidney disease with stage 1 through stage 4 chronic kidney disease, or unspecified chronic kidney disease: Secondary | ICD-10-CM | POA: Diagnosis not present

## 2023-02-13 DIAGNOSIS — K5900 Constipation, unspecified: Secondary | ICD-10-CM | POA: Diagnosis not present

## 2023-02-13 DIAGNOSIS — N1831 Chronic kidney disease, stage 3a: Secondary | ICD-10-CM | POA: Diagnosis not present

## 2023-02-13 DIAGNOSIS — D631 Anemia in chronic kidney disease: Secondary | ICD-10-CM | POA: Diagnosis not present

## 2023-02-13 DIAGNOSIS — E559 Vitamin D deficiency, unspecified: Secondary | ICD-10-CM | POA: Diagnosis not present

## 2023-02-13 DIAGNOSIS — Z7982 Long term (current) use of aspirin: Secondary | ICD-10-CM | POA: Diagnosis not present

## 2023-02-13 DIAGNOSIS — Z466 Encounter for fitting and adjustment of urinary device: Secondary | ICD-10-CM | POA: Diagnosis not present

## 2023-02-13 DIAGNOSIS — M4800 Spinal stenosis, site unspecified: Secondary | ICD-10-CM | POA: Diagnosis not present

## 2023-02-13 DIAGNOSIS — E785 Hyperlipidemia, unspecified: Secondary | ICD-10-CM | POA: Diagnosis not present

## 2023-02-13 DIAGNOSIS — Z7952 Long term (current) use of systemic steroids: Secondary | ICD-10-CM | POA: Diagnosis not present

## 2023-02-13 DIAGNOSIS — G4700 Insomnia, unspecified: Secondary | ICD-10-CM | POA: Diagnosis not present

## 2023-02-17 DIAGNOSIS — M4800 Spinal stenosis, site unspecified: Secondary | ICD-10-CM | POA: Diagnosis not present

## 2023-02-17 DIAGNOSIS — E1151 Type 2 diabetes mellitus with diabetic peripheral angiopathy without gangrene: Secondary | ICD-10-CM | POA: Diagnosis not present

## 2023-02-17 DIAGNOSIS — D631 Anemia in chronic kidney disease: Secondary | ICD-10-CM | POA: Diagnosis not present

## 2023-02-17 DIAGNOSIS — G3184 Mild cognitive impairment, so stated: Secondary | ICD-10-CM | POA: Diagnosis not present

## 2023-02-17 DIAGNOSIS — Z993 Dependence on wheelchair: Secondary | ICD-10-CM | POA: Diagnosis not present

## 2023-02-17 DIAGNOSIS — N1831 Chronic kidney disease, stage 3a: Secondary | ICD-10-CM | POA: Diagnosis not present

## 2023-02-17 DIAGNOSIS — E559 Vitamin D deficiency, unspecified: Secondary | ICD-10-CM | POA: Diagnosis not present

## 2023-02-17 DIAGNOSIS — E785 Hyperlipidemia, unspecified: Secondary | ICD-10-CM | POA: Diagnosis not present

## 2023-02-17 DIAGNOSIS — I129 Hypertensive chronic kidney disease with stage 1 through stage 4 chronic kidney disease, or unspecified chronic kidney disease: Secondary | ICD-10-CM | POA: Diagnosis not present

## 2023-02-17 DIAGNOSIS — S81802D Unspecified open wound, left lower leg, subsequent encounter: Secondary | ICD-10-CM | POA: Diagnosis not present

## 2023-02-17 DIAGNOSIS — Z7952 Long term (current) use of systemic steroids: Secondary | ICD-10-CM | POA: Diagnosis not present

## 2023-02-17 DIAGNOSIS — E1122 Type 2 diabetes mellitus with diabetic chronic kidney disease: Secondary | ICD-10-CM | POA: Diagnosis not present

## 2023-02-17 DIAGNOSIS — Z466 Encounter for fitting and adjustment of urinary device: Secondary | ICD-10-CM | POA: Diagnosis not present

## 2023-02-17 DIAGNOSIS — G894 Chronic pain syndrome: Secondary | ICD-10-CM | POA: Diagnosis not present

## 2023-02-17 DIAGNOSIS — J309 Allergic rhinitis, unspecified: Secondary | ICD-10-CM | POA: Diagnosis not present

## 2023-02-17 DIAGNOSIS — G4700 Insomnia, unspecified: Secondary | ICD-10-CM | POA: Diagnosis not present

## 2023-02-17 DIAGNOSIS — E039 Hypothyroidism, unspecified: Secondary | ICD-10-CM | POA: Diagnosis not present

## 2023-02-17 DIAGNOSIS — Z7982 Long term (current) use of aspirin: Secondary | ICD-10-CM | POA: Diagnosis not present

## 2023-02-17 DIAGNOSIS — K5900 Constipation, unspecified: Secondary | ICD-10-CM | POA: Diagnosis not present

## 2023-02-17 DIAGNOSIS — M069 Rheumatoid arthritis, unspecified: Secondary | ICD-10-CM | POA: Diagnosis not present

## 2023-02-20 DIAGNOSIS — Z993 Dependence on wheelchair: Secondary | ICD-10-CM | POA: Diagnosis not present

## 2023-02-20 DIAGNOSIS — Z7952 Long term (current) use of systemic steroids: Secondary | ICD-10-CM | POA: Diagnosis not present

## 2023-02-20 DIAGNOSIS — E785 Hyperlipidemia, unspecified: Secondary | ICD-10-CM | POA: Diagnosis not present

## 2023-02-20 DIAGNOSIS — I129 Hypertensive chronic kidney disease with stage 1 through stage 4 chronic kidney disease, or unspecified chronic kidney disease: Secondary | ICD-10-CM | POA: Diagnosis not present

## 2023-02-20 DIAGNOSIS — K5900 Constipation, unspecified: Secondary | ICD-10-CM | POA: Diagnosis not present

## 2023-02-20 DIAGNOSIS — Z466 Encounter for fitting and adjustment of urinary device: Secondary | ICD-10-CM | POA: Diagnosis not present

## 2023-02-20 DIAGNOSIS — M4800 Spinal stenosis, site unspecified: Secondary | ICD-10-CM | POA: Diagnosis not present

## 2023-02-20 DIAGNOSIS — E1151 Type 2 diabetes mellitus with diabetic peripheral angiopathy without gangrene: Secondary | ICD-10-CM | POA: Diagnosis not present

## 2023-02-20 DIAGNOSIS — G3184 Mild cognitive impairment, so stated: Secondary | ICD-10-CM | POA: Diagnosis not present

## 2023-02-20 DIAGNOSIS — S81802D Unspecified open wound, left lower leg, subsequent encounter: Secondary | ICD-10-CM | POA: Diagnosis not present

## 2023-02-20 DIAGNOSIS — D631 Anemia in chronic kidney disease: Secondary | ICD-10-CM | POA: Diagnosis not present

## 2023-02-20 DIAGNOSIS — E039 Hypothyroidism, unspecified: Secondary | ICD-10-CM | POA: Diagnosis not present

## 2023-02-20 DIAGNOSIS — J309 Allergic rhinitis, unspecified: Secondary | ICD-10-CM | POA: Diagnosis not present

## 2023-02-20 DIAGNOSIS — N1831 Chronic kidney disease, stage 3a: Secondary | ICD-10-CM | POA: Diagnosis not present

## 2023-02-20 DIAGNOSIS — E559 Vitamin D deficiency, unspecified: Secondary | ICD-10-CM | POA: Diagnosis not present

## 2023-02-20 DIAGNOSIS — E1122 Type 2 diabetes mellitus with diabetic chronic kidney disease: Secondary | ICD-10-CM | POA: Diagnosis not present

## 2023-02-20 DIAGNOSIS — M069 Rheumatoid arthritis, unspecified: Secondary | ICD-10-CM | POA: Diagnosis not present

## 2023-02-20 DIAGNOSIS — G894 Chronic pain syndrome: Secondary | ICD-10-CM | POA: Diagnosis not present

## 2023-02-20 DIAGNOSIS — Z7982 Long term (current) use of aspirin: Secondary | ICD-10-CM | POA: Diagnosis not present

## 2023-02-20 DIAGNOSIS — G4700 Insomnia, unspecified: Secondary | ICD-10-CM | POA: Diagnosis not present

## 2023-02-23 DIAGNOSIS — N1831 Chronic kidney disease, stage 3a: Secondary | ICD-10-CM | POA: Diagnosis not present

## 2023-02-23 DIAGNOSIS — M069 Rheumatoid arthritis, unspecified: Secondary | ICD-10-CM | POA: Diagnosis not present

## 2023-02-23 DIAGNOSIS — G3184 Mild cognitive impairment, so stated: Secondary | ICD-10-CM | POA: Diagnosis not present

## 2023-02-23 DIAGNOSIS — S81802D Unspecified open wound, left lower leg, subsequent encounter: Secondary | ICD-10-CM | POA: Diagnosis not present

## 2023-02-23 DIAGNOSIS — Z7982 Long term (current) use of aspirin: Secondary | ICD-10-CM | POA: Diagnosis not present

## 2023-02-23 DIAGNOSIS — E559 Vitamin D deficiency, unspecified: Secondary | ICD-10-CM | POA: Diagnosis not present

## 2023-02-23 DIAGNOSIS — E1122 Type 2 diabetes mellitus with diabetic chronic kidney disease: Secondary | ICD-10-CM | POA: Diagnosis not present

## 2023-02-23 DIAGNOSIS — I129 Hypertensive chronic kidney disease with stage 1 through stage 4 chronic kidney disease, or unspecified chronic kidney disease: Secondary | ICD-10-CM | POA: Diagnosis not present

## 2023-02-23 DIAGNOSIS — Z993 Dependence on wheelchair: Secondary | ICD-10-CM | POA: Diagnosis not present

## 2023-02-23 DIAGNOSIS — D631 Anemia in chronic kidney disease: Secondary | ICD-10-CM | POA: Diagnosis not present

## 2023-02-23 DIAGNOSIS — K5900 Constipation, unspecified: Secondary | ICD-10-CM | POA: Diagnosis not present

## 2023-02-23 DIAGNOSIS — E785 Hyperlipidemia, unspecified: Secondary | ICD-10-CM | POA: Diagnosis not present

## 2023-02-23 DIAGNOSIS — E039 Hypothyroidism, unspecified: Secondary | ICD-10-CM | POA: Diagnosis not present

## 2023-02-23 DIAGNOSIS — G4700 Insomnia, unspecified: Secondary | ICD-10-CM | POA: Diagnosis not present

## 2023-02-23 DIAGNOSIS — G894 Chronic pain syndrome: Secondary | ICD-10-CM | POA: Diagnosis not present

## 2023-02-23 DIAGNOSIS — J309 Allergic rhinitis, unspecified: Secondary | ICD-10-CM | POA: Diagnosis not present

## 2023-02-23 DIAGNOSIS — Z7952 Long term (current) use of systemic steroids: Secondary | ICD-10-CM | POA: Diagnosis not present

## 2023-02-23 DIAGNOSIS — M4800 Spinal stenosis, site unspecified: Secondary | ICD-10-CM | POA: Diagnosis not present

## 2023-02-23 DIAGNOSIS — E1151 Type 2 diabetes mellitus with diabetic peripheral angiopathy without gangrene: Secondary | ICD-10-CM | POA: Diagnosis not present

## 2023-02-23 DIAGNOSIS — Z466 Encounter for fitting and adjustment of urinary device: Secondary | ICD-10-CM | POA: Diagnosis not present

## 2023-02-24 DIAGNOSIS — Z993 Dependence on wheelchair: Secondary | ICD-10-CM | POA: Diagnosis not present

## 2023-02-24 DIAGNOSIS — I129 Hypertensive chronic kidney disease with stage 1 through stage 4 chronic kidney disease, or unspecified chronic kidney disease: Secondary | ICD-10-CM | POA: Diagnosis not present

## 2023-02-24 DIAGNOSIS — G3184 Mild cognitive impairment, so stated: Secondary | ICD-10-CM | POA: Diagnosis not present

## 2023-02-24 DIAGNOSIS — M4800 Spinal stenosis, site unspecified: Secondary | ICD-10-CM | POA: Diagnosis not present

## 2023-02-24 DIAGNOSIS — D631 Anemia in chronic kidney disease: Secondary | ICD-10-CM | POA: Diagnosis not present

## 2023-02-24 DIAGNOSIS — N1831 Chronic kidney disease, stage 3a: Secondary | ICD-10-CM | POA: Diagnosis not present

## 2023-02-24 DIAGNOSIS — S81802D Unspecified open wound, left lower leg, subsequent encounter: Secondary | ICD-10-CM | POA: Diagnosis not present

## 2023-02-24 DIAGNOSIS — J309 Allergic rhinitis, unspecified: Secondary | ICD-10-CM | POA: Diagnosis not present

## 2023-02-24 DIAGNOSIS — Z7982 Long term (current) use of aspirin: Secondary | ICD-10-CM | POA: Diagnosis not present

## 2023-02-24 DIAGNOSIS — M069 Rheumatoid arthritis, unspecified: Secondary | ICD-10-CM | POA: Diagnosis not present

## 2023-02-24 DIAGNOSIS — K5900 Constipation, unspecified: Secondary | ICD-10-CM | POA: Diagnosis not present

## 2023-02-24 DIAGNOSIS — G4700 Insomnia, unspecified: Secondary | ICD-10-CM | POA: Diagnosis not present

## 2023-02-24 DIAGNOSIS — Z466 Encounter for fitting and adjustment of urinary device: Secondary | ICD-10-CM | POA: Diagnosis not present

## 2023-02-24 DIAGNOSIS — E559 Vitamin D deficiency, unspecified: Secondary | ICD-10-CM | POA: Diagnosis not present

## 2023-02-24 DIAGNOSIS — E1122 Type 2 diabetes mellitus with diabetic chronic kidney disease: Secondary | ICD-10-CM | POA: Diagnosis not present

## 2023-02-24 DIAGNOSIS — E785 Hyperlipidemia, unspecified: Secondary | ICD-10-CM | POA: Diagnosis not present

## 2023-02-24 DIAGNOSIS — E1151 Type 2 diabetes mellitus with diabetic peripheral angiopathy without gangrene: Secondary | ICD-10-CM | POA: Diagnosis not present

## 2023-02-24 DIAGNOSIS — E039 Hypothyroidism, unspecified: Secondary | ICD-10-CM | POA: Diagnosis not present

## 2023-02-24 DIAGNOSIS — G894 Chronic pain syndrome: Secondary | ICD-10-CM | POA: Diagnosis not present

## 2023-02-24 DIAGNOSIS — Z7952 Long term (current) use of systemic steroids: Secondary | ICD-10-CM | POA: Diagnosis not present

## 2023-02-26 DIAGNOSIS — E1151 Type 2 diabetes mellitus with diabetic peripheral angiopathy without gangrene: Secondary | ICD-10-CM | POA: Diagnosis not present

## 2023-02-26 DIAGNOSIS — Z466 Encounter for fitting and adjustment of urinary device: Secondary | ICD-10-CM | POA: Diagnosis not present

## 2023-02-26 DIAGNOSIS — G4700 Insomnia, unspecified: Secondary | ICD-10-CM | POA: Diagnosis not present

## 2023-02-26 DIAGNOSIS — G894 Chronic pain syndrome: Secondary | ICD-10-CM | POA: Diagnosis not present

## 2023-02-26 DIAGNOSIS — I129 Hypertensive chronic kidney disease with stage 1 through stage 4 chronic kidney disease, or unspecified chronic kidney disease: Secondary | ICD-10-CM | POA: Diagnosis not present

## 2023-02-26 DIAGNOSIS — N1831 Chronic kidney disease, stage 3a: Secondary | ICD-10-CM | POA: Diagnosis not present

## 2023-02-26 DIAGNOSIS — M069 Rheumatoid arthritis, unspecified: Secondary | ICD-10-CM | POA: Diagnosis not present

## 2023-02-26 DIAGNOSIS — E559 Vitamin D deficiency, unspecified: Secondary | ICD-10-CM | POA: Diagnosis not present

## 2023-02-26 DIAGNOSIS — Z7982 Long term (current) use of aspirin: Secondary | ICD-10-CM | POA: Diagnosis not present

## 2023-02-26 DIAGNOSIS — K5900 Constipation, unspecified: Secondary | ICD-10-CM | POA: Diagnosis not present

## 2023-02-26 DIAGNOSIS — E785 Hyperlipidemia, unspecified: Secondary | ICD-10-CM | POA: Diagnosis not present

## 2023-02-26 DIAGNOSIS — E039 Hypothyroidism, unspecified: Secondary | ICD-10-CM | POA: Diagnosis not present

## 2023-02-26 DIAGNOSIS — D631 Anemia in chronic kidney disease: Secondary | ICD-10-CM | POA: Diagnosis not present

## 2023-02-26 DIAGNOSIS — Z7952 Long term (current) use of systemic steroids: Secondary | ICD-10-CM | POA: Diagnosis not present

## 2023-02-26 DIAGNOSIS — S81802D Unspecified open wound, left lower leg, subsequent encounter: Secondary | ICD-10-CM | POA: Diagnosis not present

## 2023-02-26 DIAGNOSIS — G3184 Mild cognitive impairment, so stated: Secondary | ICD-10-CM | POA: Diagnosis not present

## 2023-02-26 DIAGNOSIS — M4800 Spinal stenosis, site unspecified: Secondary | ICD-10-CM | POA: Diagnosis not present

## 2023-02-26 DIAGNOSIS — J309 Allergic rhinitis, unspecified: Secondary | ICD-10-CM | POA: Diagnosis not present

## 2023-02-26 DIAGNOSIS — Z993 Dependence on wheelchair: Secondary | ICD-10-CM | POA: Diagnosis not present

## 2023-02-26 DIAGNOSIS — E1122 Type 2 diabetes mellitus with diabetic chronic kidney disease: Secondary | ICD-10-CM | POA: Diagnosis not present

## 2023-03-02 DIAGNOSIS — E039 Hypothyroidism, unspecified: Secondary | ICD-10-CM | POA: Diagnosis not present

## 2023-03-02 DIAGNOSIS — J309 Allergic rhinitis, unspecified: Secondary | ICD-10-CM | POA: Diagnosis not present

## 2023-03-02 DIAGNOSIS — E785 Hyperlipidemia, unspecified: Secondary | ICD-10-CM | POA: Diagnosis not present

## 2023-03-02 DIAGNOSIS — M069 Rheumatoid arthritis, unspecified: Secondary | ICD-10-CM | POA: Diagnosis not present

## 2023-03-02 DIAGNOSIS — G894 Chronic pain syndrome: Secondary | ICD-10-CM | POA: Diagnosis not present

## 2023-03-02 DIAGNOSIS — K5900 Constipation, unspecified: Secondary | ICD-10-CM | POA: Diagnosis not present

## 2023-03-02 DIAGNOSIS — Z466 Encounter for fitting and adjustment of urinary device: Secondary | ICD-10-CM | POA: Diagnosis not present

## 2023-03-02 DIAGNOSIS — E1151 Type 2 diabetes mellitus with diabetic peripheral angiopathy without gangrene: Secondary | ICD-10-CM | POA: Diagnosis not present

## 2023-03-02 DIAGNOSIS — Z993 Dependence on wheelchair: Secondary | ICD-10-CM | POA: Diagnosis not present

## 2023-03-02 DIAGNOSIS — G3184 Mild cognitive impairment, so stated: Secondary | ICD-10-CM | POA: Diagnosis not present

## 2023-03-02 DIAGNOSIS — M4800 Spinal stenosis, site unspecified: Secondary | ICD-10-CM | POA: Diagnosis not present

## 2023-03-02 DIAGNOSIS — D631 Anemia in chronic kidney disease: Secondary | ICD-10-CM | POA: Diagnosis not present

## 2023-03-02 DIAGNOSIS — E559 Vitamin D deficiency, unspecified: Secondary | ICD-10-CM | POA: Diagnosis not present

## 2023-03-02 DIAGNOSIS — S81802D Unspecified open wound, left lower leg, subsequent encounter: Secondary | ICD-10-CM | POA: Diagnosis not present

## 2023-03-02 DIAGNOSIS — I129 Hypertensive chronic kidney disease with stage 1 through stage 4 chronic kidney disease, or unspecified chronic kidney disease: Secondary | ICD-10-CM | POA: Diagnosis not present

## 2023-03-02 DIAGNOSIS — E1122 Type 2 diabetes mellitus with diabetic chronic kidney disease: Secondary | ICD-10-CM | POA: Diagnosis not present

## 2023-03-02 DIAGNOSIS — G4700 Insomnia, unspecified: Secondary | ICD-10-CM | POA: Diagnosis not present

## 2023-03-02 DIAGNOSIS — Z7952 Long term (current) use of systemic steroids: Secondary | ICD-10-CM | POA: Diagnosis not present

## 2023-03-02 DIAGNOSIS — N1831 Chronic kidney disease, stage 3a: Secondary | ICD-10-CM | POA: Diagnosis not present

## 2023-03-02 DIAGNOSIS — Z7982 Long term (current) use of aspirin: Secondary | ICD-10-CM | POA: Diagnosis not present

## 2023-03-03 DIAGNOSIS — G894 Chronic pain syndrome: Secondary | ICD-10-CM | POA: Diagnosis not present

## 2023-03-03 DIAGNOSIS — D631 Anemia in chronic kidney disease: Secondary | ICD-10-CM | POA: Diagnosis not present

## 2023-03-03 DIAGNOSIS — G3184 Mild cognitive impairment, so stated: Secondary | ICD-10-CM | POA: Diagnosis not present

## 2023-03-03 DIAGNOSIS — S81802D Unspecified open wound, left lower leg, subsequent encounter: Secondary | ICD-10-CM | POA: Diagnosis not present

## 2023-03-03 DIAGNOSIS — Z7982 Long term (current) use of aspirin: Secondary | ICD-10-CM | POA: Diagnosis not present

## 2023-03-03 DIAGNOSIS — Z7952 Long term (current) use of systemic steroids: Secondary | ICD-10-CM | POA: Diagnosis not present

## 2023-03-03 DIAGNOSIS — E1122 Type 2 diabetes mellitus with diabetic chronic kidney disease: Secondary | ICD-10-CM | POA: Diagnosis not present

## 2023-03-03 DIAGNOSIS — E1151 Type 2 diabetes mellitus with diabetic peripheral angiopathy without gangrene: Secondary | ICD-10-CM | POA: Diagnosis not present

## 2023-03-03 DIAGNOSIS — Z466 Encounter for fitting and adjustment of urinary device: Secondary | ICD-10-CM | POA: Diagnosis not present

## 2023-03-03 DIAGNOSIS — M4800 Spinal stenosis, site unspecified: Secondary | ICD-10-CM | POA: Diagnosis not present

## 2023-03-03 DIAGNOSIS — Z993 Dependence on wheelchair: Secondary | ICD-10-CM | POA: Diagnosis not present

## 2023-03-03 DIAGNOSIS — J309 Allergic rhinitis, unspecified: Secondary | ICD-10-CM | POA: Diagnosis not present

## 2023-03-03 DIAGNOSIS — E559 Vitamin D deficiency, unspecified: Secondary | ICD-10-CM | POA: Diagnosis not present

## 2023-03-03 DIAGNOSIS — K5900 Constipation, unspecified: Secondary | ICD-10-CM | POA: Diagnosis not present

## 2023-03-03 DIAGNOSIS — G4700 Insomnia, unspecified: Secondary | ICD-10-CM | POA: Diagnosis not present

## 2023-03-03 DIAGNOSIS — M069 Rheumatoid arthritis, unspecified: Secondary | ICD-10-CM | POA: Diagnosis not present

## 2023-03-03 DIAGNOSIS — N1831 Chronic kidney disease, stage 3a: Secondary | ICD-10-CM | POA: Diagnosis not present

## 2023-03-03 DIAGNOSIS — I129 Hypertensive chronic kidney disease with stage 1 through stage 4 chronic kidney disease, or unspecified chronic kidney disease: Secondary | ICD-10-CM | POA: Diagnosis not present

## 2023-03-03 DIAGNOSIS — E039 Hypothyroidism, unspecified: Secondary | ICD-10-CM | POA: Diagnosis not present

## 2023-03-03 DIAGNOSIS — E785 Hyperlipidemia, unspecified: Secondary | ICD-10-CM | POA: Diagnosis not present

## 2023-03-04 DIAGNOSIS — I129 Hypertensive chronic kidney disease with stage 1 through stage 4 chronic kidney disease, or unspecified chronic kidney disease: Secondary | ICD-10-CM | POA: Diagnosis not present

## 2023-03-04 DIAGNOSIS — N1832 Chronic kidney disease, stage 3b: Secondary | ICD-10-CM | POA: Diagnosis not present

## 2023-03-04 DIAGNOSIS — Z79899 Other long term (current) drug therapy: Secondary | ICD-10-CM | POA: Diagnosis not present

## 2023-03-04 DIAGNOSIS — R600 Localized edema: Secondary | ICD-10-CM | POA: Diagnosis not present

## 2023-03-05 DIAGNOSIS — E1122 Type 2 diabetes mellitus with diabetic chronic kidney disease: Secondary | ICD-10-CM | POA: Diagnosis not present

## 2023-03-05 DIAGNOSIS — K5900 Constipation, unspecified: Secondary | ICD-10-CM | POA: Diagnosis not present

## 2023-03-05 DIAGNOSIS — M069 Rheumatoid arthritis, unspecified: Secondary | ICD-10-CM | POA: Diagnosis not present

## 2023-03-05 DIAGNOSIS — E1151 Type 2 diabetes mellitus with diabetic peripheral angiopathy without gangrene: Secondary | ICD-10-CM | POA: Diagnosis not present

## 2023-03-05 DIAGNOSIS — E039 Hypothyroidism, unspecified: Secondary | ICD-10-CM | POA: Diagnosis not present

## 2023-03-05 DIAGNOSIS — Z466 Encounter for fitting and adjustment of urinary device: Secondary | ICD-10-CM | POA: Diagnosis not present

## 2023-03-05 DIAGNOSIS — N1831 Chronic kidney disease, stage 3a: Secondary | ICD-10-CM | POA: Diagnosis not present

## 2023-03-05 DIAGNOSIS — G4700 Insomnia, unspecified: Secondary | ICD-10-CM | POA: Diagnosis not present

## 2023-03-05 DIAGNOSIS — S81802D Unspecified open wound, left lower leg, subsequent encounter: Secondary | ICD-10-CM | POA: Diagnosis not present

## 2023-03-05 DIAGNOSIS — D631 Anemia in chronic kidney disease: Secondary | ICD-10-CM | POA: Diagnosis not present

## 2023-03-05 DIAGNOSIS — Z7982 Long term (current) use of aspirin: Secondary | ICD-10-CM | POA: Diagnosis not present

## 2023-03-05 DIAGNOSIS — E559 Vitamin D deficiency, unspecified: Secondary | ICD-10-CM | POA: Diagnosis not present

## 2023-03-05 DIAGNOSIS — Z7952 Long term (current) use of systemic steroids: Secondary | ICD-10-CM | POA: Diagnosis not present

## 2023-03-05 DIAGNOSIS — J309 Allergic rhinitis, unspecified: Secondary | ICD-10-CM | POA: Diagnosis not present

## 2023-03-05 DIAGNOSIS — Z993 Dependence on wheelchair: Secondary | ICD-10-CM | POA: Diagnosis not present

## 2023-03-05 DIAGNOSIS — I129 Hypertensive chronic kidney disease with stage 1 through stage 4 chronic kidney disease, or unspecified chronic kidney disease: Secondary | ICD-10-CM | POA: Diagnosis not present

## 2023-03-05 DIAGNOSIS — M4800 Spinal stenosis, site unspecified: Secondary | ICD-10-CM | POA: Diagnosis not present

## 2023-03-05 DIAGNOSIS — G3184 Mild cognitive impairment, so stated: Secondary | ICD-10-CM | POA: Diagnosis not present

## 2023-03-05 DIAGNOSIS — E785 Hyperlipidemia, unspecified: Secondary | ICD-10-CM | POA: Diagnosis not present

## 2023-03-05 DIAGNOSIS — G894 Chronic pain syndrome: Secondary | ICD-10-CM | POA: Diagnosis not present

## 2023-03-06 DIAGNOSIS — K5900 Constipation, unspecified: Secondary | ICD-10-CM | POA: Diagnosis not present

## 2023-03-06 DIAGNOSIS — M4800 Spinal stenosis, site unspecified: Secondary | ICD-10-CM | POA: Diagnosis not present

## 2023-03-06 DIAGNOSIS — J309 Allergic rhinitis, unspecified: Secondary | ICD-10-CM | POA: Diagnosis not present

## 2023-03-06 DIAGNOSIS — E785 Hyperlipidemia, unspecified: Secondary | ICD-10-CM | POA: Diagnosis not present

## 2023-03-06 DIAGNOSIS — Z466 Encounter for fitting and adjustment of urinary device: Secondary | ICD-10-CM | POA: Diagnosis not present

## 2023-03-06 DIAGNOSIS — Z993 Dependence on wheelchair: Secondary | ICD-10-CM | POA: Diagnosis not present

## 2023-03-06 DIAGNOSIS — E559 Vitamin D deficiency, unspecified: Secondary | ICD-10-CM | POA: Diagnosis not present

## 2023-03-06 DIAGNOSIS — G894 Chronic pain syndrome: Secondary | ICD-10-CM | POA: Diagnosis not present

## 2023-03-06 DIAGNOSIS — M069 Rheumatoid arthritis, unspecified: Secondary | ICD-10-CM | POA: Diagnosis not present

## 2023-03-06 DIAGNOSIS — S81802D Unspecified open wound, left lower leg, subsequent encounter: Secondary | ICD-10-CM | POA: Diagnosis not present

## 2023-03-06 DIAGNOSIS — I129 Hypertensive chronic kidney disease with stage 1 through stage 4 chronic kidney disease, or unspecified chronic kidney disease: Secondary | ICD-10-CM | POA: Diagnosis not present

## 2023-03-06 DIAGNOSIS — G4700 Insomnia, unspecified: Secondary | ICD-10-CM | POA: Diagnosis not present

## 2023-03-06 DIAGNOSIS — N1831 Chronic kidney disease, stage 3a: Secondary | ICD-10-CM | POA: Diagnosis not present

## 2023-03-06 DIAGNOSIS — E039 Hypothyroidism, unspecified: Secondary | ICD-10-CM | POA: Diagnosis not present

## 2023-03-06 DIAGNOSIS — D631 Anemia in chronic kidney disease: Secondary | ICD-10-CM | POA: Diagnosis not present

## 2023-03-06 DIAGNOSIS — E1151 Type 2 diabetes mellitus with diabetic peripheral angiopathy without gangrene: Secondary | ICD-10-CM | POA: Diagnosis not present

## 2023-03-06 DIAGNOSIS — E1122 Type 2 diabetes mellitus with diabetic chronic kidney disease: Secondary | ICD-10-CM | POA: Diagnosis not present

## 2023-03-06 DIAGNOSIS — G3184 Mild cognitive impairment, so stated: Secondary | ICD-10-CM | POA: Diagnosis not present

## 2023-03-06 DIAGNOSIS — Z7952 Long term (current) use of systemic steroids: Secondary | ICD-10-CM | POA: Diagnosis not present

## 2023-03-06 DIAGNOSIS — Z7982 Long term (current) use of aspirin: Secondary | ICD-10-CM | POA: Diagnosis not present

## 2023-03-09 DIAGNOSIS — M4800 Spinal stenosis, site unspecified: Secondary | ICD-10-CM | POA: Diagnosis not present

## 2023-03-09 DIAGNOSIS — E1151 Type 2 diabetes mellitus with diabetic peripheral angiopathy without gangrene: Secondary | ICD-10-CM | POA: Diagnosis not present

## 2023-03-09 DIAGNOSIS — G3184 Mild cognitive impairment, so stated: Secondary | ICD-10-CM | POA: Diagnosis not present

## 2023-03-09 DIAGNOSIS — I129 Hypertensive chronic kidney disease with stage 1 through stage 4 chronic kidney disease, or unspecified chronic kidney disease: Secondary | ICD-10-CM | POA: Diagnosis not present

## 2023-03-09 DIAGNOSIS — E039 Hypothyroidism, unspecified: Secondary | ICD-10-CM | POA: Diagnosis not present

## 2023-03-09 DIAGNOSIS — M069 Rheumatoid arthritis, unspecified: Secondary | ICD-10-CM | POA: Diagnosis not present

## 2023-03-09 DIAGNOSIS — N1831 Chronic kidney disease, stage 3a: Secondary | ICD-10-CM | POA: Diagnosis not present

## 2023-03-09 DIAGNOSIS — E1122 Type 2 diabetes mellitus with diabetic chronic kidney disease: Secondary | ICD-10-CM | POA: Diagnosis not present

## 2023-03-09 DIAGNOSIS — G4700 Insomnia, unspecified: Secondary | ICD-10-CM | POA: Diagnosis not present

## 2023-03-09 DIAGNOSIS — D631 Anemia in chronic kidney disease: Secondary | ICD-10-CM | POA: Diagnosis not present

## 2023-03-09 DIAGNOSIS — E559 Vitamin D deficiency, unspecified: Secondary | ICD-10-CM | POA: Diagnosis not present

## 2023-03-09 DIAGNOSIS — G894 Chronic pain syndrome: Secondary | ICD-10-CM | POA: Diagnosis not present

## 2023-03-09 DIAGNOSIS — Z993 Dependence on wheelchair: Secondary | ICD-10-CM | POA: Diagnosis not present

## 2023-03-09 DIAGNOSIS — S81802D Unspecified open wound, left lower leg, subsequent encounter: Secondary | ICD-10-CM | POA: Diagnosis not present

## 2023-03-09 DIAGNOSIS — Z7982 Long term (current) use of aspirin: Secondary | ICD-10-CM | POA: Diagnosis not present

## 2023-03-09 DIAGNOSIS — J309 Allergic rhinitis, unspecified: Secondary | ICD-10-CM | POA: Diagnosis not present

## 2023-03-09 DIAGNOSIS — E785 Hyperlipidemia, unspecified: Secondary | ICD-10-CM | POA: Diagnosis not present

## 2023-03-09 DIAGNOSIS — Z466 Encounter for fitting and adjustment of urinary device: Secondary | ICD-10-CM | POA: Diagnosis not present

## 2023-03-09 DIAGNOSIS — K5900 Constipation, unspecified: Secondary | ICD-10-CM | POA: Diagnosis not present

## 2023-03-09 DIAGNOSIS — Z7952 Long term (current) use of systemic steroids: Secondary | ICD-10-CM | POA: Diagnosis not present

## 2023-03-10 DIAGNOSIS — M069 Rheumatoid arthritis, unspecified: Secondary | ICD-10-CM | POA: Diagnosis not present

## 2023-03-10 DIAGNOSIS — G894 Chronic pain syndrome: Secondary | ICD-10-CM | POA: Diagnosis not present

## 2023-03-10 DIAGNOSIS — E559 Vitamin D deficiency, unspecified: Secondary | ICD-10-CM | POA: Diagnosis not present

## 2023-03-10 DIAGNOSIS — E1151 Type 2 diabetes mellitus with diabetic peripheral angiopathy without gangrene: Secondary | ICD-10-CM | POA: Diagnosis not present

## 2023-03-10 DIAGNOSIS — S81802D Unspecified open wound, left lower leg, subsequent encounter: Secondary | ICD-10-CM | POA: Diagnosis not present

## 2023-03-10 DIAGNOSIS — Z466 Encounter for fitting and adjustment of urinary device: Secondary | ICD-10-CM | POA: Diagnosis not present

## 2023-03-10 DIAGNOSIS — G3184 Mild cognitive impairment, so stated: Secondary | ICD-10-CM | POA: Diagnosis not present

## 2023-03-10 DIAGNOSIS — G4700 Insomnia, unspecified: Secondary | ICD-10-CM | POA: Diagnosis not present

## 2023-03-10 DIAGNOSIS — Z993 Dependence on wheelchair: Secondary | ICD-10-CM | POA: Diagnosis not present

## 2023-03-10 DIAGNOSIS — Z7952 Long term (current) use of systemic steroids: Secondary | ICD-10-CM | POA: Diagnosis not present

## 2023-03-10 DIAGNOSIS — I129 Hypertensive chronic kidney disease with stage 1 through stage 4 chronic kidney disease, or unspecified chronic kidney disease: Secondary | ICD-10-CM | POA: Diagnosis not present

## 2023-03-10 DIAGNOSIS — K5900 Constipation, unspecified: Secondary | ICD-10-CM | POA: Diagnosis not present

## 2023-03-10 DIAGNOSIS — D631 Anemia in chronic kidney disease: Secondary | ICD-10-CM | POA: Diagnosis not present

## 2023-03-10 DIAGNOSIS — M4800 Spinal stenosis, site unspecified: Secondary | ICD-10-CM | POA: Diagnosis not present

## 2023-03-10 DIAGNOSIS — J309 Allergic rhinitis, unspecified: Secondary | ICD-10-CM | POA: Diagnosis not present

## 2023-03-10 DIAGNOSIS — N1831 Chronic kidney disease, stage 3a: Secondary | ICD-10-CM | POA: Diagnosis not present

## 2023-03-10 DIAGNOSIS — E1122 Type 2 diabetes mellitus with diabetic chronic kidney disease: Secondary | ICD-10-CM | POA: Diagnosis not present

## 2023-03-10 DIAGNOSIS — E039 Hypothyroidism, unspecified: Secondary | ICD-10-CM | POA: Diagnosis not present

## 2023-03-10 DIAGNOSIS — E785 Hyperlipidemia, unspecified: Secondary | ICD-10-CM | POA: Diagnosis not present

## 2023-03-10 DIAGNOSIS — Z7982 Long term (current) use of aspirin: Secondary | ICD-10-CM | POA: Diagnosis not present

## 2023-03-10 DIAGNOSIS — R600 Localized edema: Secondary | ICD-10-CM | POA: Diagnosis not present

## 2023-03-11 DIAGNOSIS — I10 Essential (primary) hypertension: Secondary | ICD-10-CM | POA: Diagnosis not present

## 2023-03-12 DIAGNOSIS — G3184 Mild cognitive impairment, so stated: Secondary | ICD-10-CM | POA: Diagnosis not present

## 2023-03-12 DIAGNOSIS — E785 Hyperlipidemia, unspecified: Secondary | ICD-10-CM | POA: Diagnosis not present

## 2023-03-12 DIAGNOSIS — I129 Hypertensive chronic kidney disease with stage 1 through stage 4 chronic kidney disease, or unspecified chronic kidney disease: Secondary | ICD-10-CM | POA: Diagnosis not present

## 2023-03-12 DIAGNOSIS — E039 Hypothyroidism, unspecified: Secondary | ICD-10-CM | POA: Diagnosis not present

## 2023-03-12 DIAGNOSIS — D631 Anemia in chronic kidney disease: Secondary | ICD-10-CM | POA: Diagnosis not present

## 2023-03-12 DIAGNOSIS — E559 Vitamin D deficiency, unspecified: Secondary | ICD-10-CM | POA: Diagnosis not present

## 2023-03-12 DIAGNOSIS — M069 Rheumatoid arthritis, unspecified: Secondary | ICD-10-CM | POA: Diagnosis not present

## 2023-03-12 DIAGNOSIS — Z7982 Long term (current) use of aspirin: Secondary | ICD-10-CM | POA: Diagnosis not present

## 2023-03-12 DIAGNOSIS — G4700 Insomnia, unspecified: Secondary | ICD-10-CM | POA: Diagnosis not present

## 2023-03-12 DIAGNOSIS — M4800 Spinal stenosis, site unspecified: Secondary | ICD-10-CM | POA: Diagnosis not present

## 2023-03-12 DIAGNOSIS — N1831 Chronic kidney disease, stage 3a: Secondary | ICD-10-CM | POA: Diagnosis not present

## 2023-03-12 DIAGNOSIS — J309 Allergic rhinitis, unspecified: Secondary | ICD-10-CM | POA: Diagnosis not present

## 2023-03-12 DIAGNOSIS — E1122 Type 2 diabetes mellitus with diabetic chronic kidney disease: Secondary | ICD-10-CM | POA: Diagnosis not present

## 2023-03-12 DIAGNOSIS — E1151 Type 2 diabetes mellitus with diabetic peripheral angiopathy without gangrene: Secondary | ICD-10-CM | POA: Diagnosis not present

## 2023-03-12 DIAGNOSIS — Z993 Dependence on wheelchair: Secondary | ICD-10-CM | POA: Diagnosis not present

## 2023-03-12 DIAGNOSIS — S81802D Unspecified open wound, left lower leg, subsequent encounter: Secondary | ICD-10-CM | POA: Diagnosis not present

## 2023-03-12 DIAGNOSIS — Z466 Encounter for fitting and adjustment of urinary device: Secondary | ICD-10-CM | POA: Diagnosis not present

## 2023-03-12 DIAGNOSIS — Z7952 Long term (current) use of systemic steroids: Secondary | ICD-10-CM | POA: Diagnosis not present

## 2023-03-12 DIAGNOSIS — G894 Chronic pain syndrome: Secondary | ICD-10-CM | POA: Diagnosis not present

## 2023-03-12 DIAGNOSIS — K5900 Constipation, unspecified: Secondary | ICD-10-CM | POA: Diagnosis not present

## 2023-03-17 DIAGNOSIS — E039 Hypothyroidism, unspecified: Secondary | ICD-10-CM | POA: Diagnosis not present

## 2023-03-17 DIAGNOSIS — Z7952 Long term (current) use of systemic steroids: Secondary | ICD-10-CM | POA: Diagnosis not present

## 2023-03-17 DIAGNOSIS — K5900 Constipation, unspecified: Secondary | ICD-10-CM | POA: Diagnosis not present

## 2023-03-17 DIAGNOSIS — G3184 Mild cognitive impairment, so stated: Secondary | ICD-10-CM | POA: Diagnosis not present

## 2023-03-17 DIAGNOSIS — G4700 Insomnia, unspecified: Secondary | ICD-10-CM | POA: Diagnosis not present

## 2023-03-17 DIAGNOSIS — S81802D Unspecified open wound, left lower leg, subsequent encounter: Secondary | ICD-10-CM | POA: Diagnosis not present

## 2023-03-17 DIAGNOSIS — I129 Hypertensive chronic kidney disease with stage 1 through stage 4 chronic kidney disease, or unspecified chronic kidney disease: Secondary | ICD-10-CM | POA: Diagnosis not present

## 2023-03-17 DIAGNOSIS — N1831 Chronic kidney disease, stage 3a: Secondary | ICD-10-CM | POA: Diagnosis not present

## 2023-03-17 DIAGNOSIS — Z993 Dependence on wheelchair: Secondary | ICD-10-CM | POA: Diagnosis not present

## 2023-03-17 DIAGNOSIS — D631 Anemia in chronic kidney disease: Secondary | ICD-10-CM | POA: Diagnosis not present

## 2023-03-17 DIAGNOSIS — E1151 Type 2 diabetes mellitus with diabetic peripheral angiopathy without gangrene: Secondary | ICD-10-CM | POA: Diagnosis not present

## 2023-03-17 DIAGNOSIS — M069 Rheumatoid arthritis, unspecified: Secondary | ICD-10-CM | POA: Diagnosis not present

## 2023-03-17 DIAGNOSIS — E559 Vitamin D deficiency, unspecified: Secondary | ICD-10-CM | POA: Diagnosis not present

## 2023-03-17 DIAGNOSIS — M4800 Spinal stenosis, site unspecified: Secondary | ICD-10-CM | POA: Diagnosis not present

## 2023-03-17 DIAGNOSIS — G894 Chronic pain syndrome: Secondary | ICD-10-CM | POA: Diagnosis not present

## 2023-03-17 DIAGNOSIS — Z7982 Long term (current) use of aspirin: Secondary | ICD-10-CM | POA: Diagnosis not present

## 2023-03-17 DIAGNOSIS — E785 Hyperlipidemia, unspecified: Secondary | ICD-10-CM | POA: Diagnosis not present

## 2023-03-17 DIAGNOSIS — E1122 Type 2 diabetes mellitus with diabetic chronic kidney disease: Secondary | ICD-10-CM | POA: Diagnosis not present

## 2023-03-17 DIAGNOSIS — Z466 Encounter for fitting and adjustment of urinary device: Secondary | ICD-10-CM | POA: Diagnosis not present

## 2023-03-17 DIAGNOSIS — J309 Allergic rhinitis, unspecified: Secondary | ICD-10-CM | POA: Diagnosis not present

## 2023-03-19 DIAGNOSIS — K5900 Constipation, unspecified: Secondary | ICD-10-CM | POA: Diagnosis not present

## 2023-03-19 DIAGNOSIS — G3184 Mild cognitive impairment, so stated: Secondary | ICD-10-CM | POA: Diagnosis not present

## 2023-03-19 DIAGNOSIS — N1831 Chronic kidney disease, stage 3a: Secondary | ICD-10-CM | POA: Diagnosis not present

## 2023-03-19 DIAGNOSIS — E785 Hyperlipidemia, unspecified: Secondary | ICD-10-CM | POA: Diagnosis not present

## 2023-03-19 DIAGNOSIS — Z466 Encounter for fitting and adjustment of urinary device: Secondary | ICD-10-CM | POA: Diagnosis not present

## 2023-03-19 DIAGNOSIS — G894 Chronic pain syndrome: Secondary | ICD-10-CM | POA: Diagnosis not present

## 2023-03-19 DIAGNOSIS — E559 Vitamin D deficiency, unspecified: Secondary | ICD-10-CM | POA: Diagnosis not present

## 2023-03-19 DIAGNOSIS — M4800 Spinal stenosis, site unspecified: Secondary | ICD-10-CM | POA: Diagnosis not present

## 2023-03-19 DIAGNOSIS — M069 Rheumatoid arthritis, unspecified: Secondary | ICD-10-CM | POA: Diagnosis not present

## 2023-03-19 DIAGNOSIS — Z7952 Long term (current) use of systemic steroids: Secondary | ICD-10-CM | POA: Diagnosis not present

## 2023-03-19 DIAGNOSIS — E1151 Type 2 diabetes mellitus with diabetic peripheral angiopathy without gangrene: Secondary | ICD-10-CM | POA: Diagnosis not present

## 2023-03-19 DIAGNOSIS — Z7982 Long term (current) use of aspirin: Secondary | ICD-10-CM | POA: Diagnosis not present

## 2023-03-19 DIAGNOSIS — D631 Anemia in chronic kidney disease: Secondary | ICD-10-CM | POA: Diagnosis not present

## 2023-03-19 DIAGNOSIS — G4700 Insomnia, unspecified: Secondary | ICD-10-CM | POA: Diagnosis not present

## 2023-03-19 DIAGNOSIS — S81802D Unspecified open wound, left lower leg, subsequent encounter: Secondary | ICD-10-CM | POA: Diagnosis not present

## 2023-03-19 DIAGNOSIS — E039 Hypothyroidism, unspecified: Secondary | ICD-10-CM | POA: Diagnosis not present

## 2023-03-19 DIAGNOSIS — Z993 Dependence on wheelchair: Secondary | ICD-10-CM | POA: Diagnosis not present

## 2023-03-19 DIAGNOSIS — I129 Hypertensive chronic kidney disease with stage 1 through stage 4 chronic kidney disease, or unspecified chronic kidney disease: Secondary | ICD-10-CM | POA: Diagnosis not present

## 2023-03-19 DIAGNOSIS — J309 Allergic rhinitis, unspecified: Secondary | ICD-10-CM | POA: Diagnosis not present

## 2023-03-19 DIAGNOSIS — E1122 Type 2 diabetes mellitus with diabetic chronic kidney disease: Secondary | ICD-10-CM | POA: Diagnosis not present

## 2023-03-23 DIAGNOSIS — E559 Vitamin D deficiency, unspecified: Secondary | ICD-10-CM | POA: Diagnosis not present

## 2023-03-23 DIAGNOSIS — Z466 Encounter for fitting and adjustment of urinary device: Secondary | ICD-10-CM | POA: Diagnosis not present

## 2023-03-23 DIAGNOSIS — J309 Allergic rhinitis, unspecified: Secondary | ICD-10-CM | POA: Diagnosis not present

## 2023-03-23 DIAGNOSIS — S81802D Unspecified open wound, left lower leg, subsequent encounter: Secondary | ICD-10-CM | POA: Diagnosis not present

## 2023-03-23 DIAGNOSIS — E1151 Type 2 diabetes mellitus with diabetic peripheral angiopathy without gangrene: Secondary | ICD-10-CM | POA: Diagnosis not present

## 2023-03-23 DIAGNOSIS — K5900 Constipation, unspecified: Secondary | ICD-10-CM | POA: Diagnosis not present

## 2023-03-23 DIAGNOSIS — N1831 Chronic kidney disease, stage 3a: Secondary | ICD-10-CM | POA: Diagnosis not present

## 2023-03-23 DIAGNOSIS — I129 Hypertensive chronic kidney disease with stage 1 through stage 4 chronic kidney disease, or unspecified chronic kidney disease: Secondary | ICD-10-CM | POA: Diagnosis not present

## 2023-03-23 DIAGNOSIS — M069 Rheumatoid arthritis, unspecified: Secondary | ICD-10-CM | POA: Diagnosis not present

## 2023-03-23 DIAGNOSIS — G4700 Insomnia, unspecified: Secondary | ICD-10-CM | POA: Diagnosis not present

## 2023-03-23 DIAGNOSIS — E039 Hypothyroidism, unspecified: Secondary | ICD-10-CM | POA: Diagnosis not present

## 2023-03-23 DIAGNOSIS — G894 Chronic pain syndrome: Secondary | ICD-10-CM | POA: Diagnosis not present

## 2023-03-23 DIAGNOSIS — E785 Hyperlipidemia, unspecified: Secondary | ICD-10-CM | POA: Diagnosis not present

## 2023-03-23 DIAGNOSIS — Z993 Dependence on wheelchair: Secondary | ICD-10-CM | POA: Diagnosis not present

## 2023-03-23 DIAGNOSIS — Z7952 Long term (current) use of systemic steroids: Secondary | ICD-10-CM | POA: Diagnosis not present

## 2023-03-23 DIAGNOSIS — M4800 Spinal stenosis, site unspecified: Secondary | ICD-10-CM | POA: Diagnosis not present

## 2023-03-23 DIAGNOSIS — D631 Anemia in chronic kidney disease: Secondary | ICD-10-CM | POA: Diagnosis not present

## 2023-03-23 DIAGNOSIS — G3184 Mild cognitive impairment, so stated: Secondary | ICD-10-CM | POA: Diagnosis not present

## 2023-03-23 DIAGNOSIS — E1122 Type 2 diabetes mellitus with diabetic chronic kidney disease: Secondary | ICD-10-CM | POA: Diagnosis not present

## 2023-03-23 DIAGNOSIS — Z7982 Long term (current) use of aspirin: Secondary | ICD-10-CM | POA: Diagnosis not present

## 2023-03-24 DIAGNOSIS — Z7952 Long term (current) use of systemic steroids: Secondary | ICD-10-CM | POA: Diagnosis not present

## 2023-03-24 DIAGNOSIS — E785 Hyperlipidemia, unspecified: Secondary | ICD-10-CM | POA: Diagnosis not present

## 2023-03-24 DIAGNOSIS — E1122 Type 2 diabetes mellitus with diabetic chronic kidney disease: Secondary | ICD-10-CM | POA: Diagnosis not present

## 2023-03-24 DIAGNOSIS — J309 Allergic rhinitis, unspecified: Secondary | ICD-10-CM | POA: Diagnosis not present

## 2023-03-24 DIAGNOSIS — Z466 Encounter for fitting and adjustment of urinary device: Secondary | ICD-10-CM | POA: Diagnosis not present

## 2023-03-24 DIAGNOSIS — M4800 Spinal stenosis, site unspecified: Secondary | ICD-10-CM | POA: Diagnosis not present

## 2023-03-24 DIAGNOSIS — N1831 Chronic kidney disease, stage 3a: Secondary | ICD-10-CM | POA: Diagnosis not present

## 2023-03-24 DIAGNOSIS — G3184 Mild cognitive impairment, so stated: Secondary | ICD-10-CM | POA: Diagnosis not present

## 2023-03-24 DIAGNOSIS — K5900 Constipation, unspecified: Secondary | ICD-10-CM | POA: Diagnosis not present

## 2023-03-24 DIAGNOSIS — E1151 Type 2 diabetes mellitus with diabetic peripheral angiopathy without gangrene: Secondary | ICD-10-CM | POA: Diagnosis not present

## 2023-03-24 DIAGNOSIS — I129 Hypertensive chronic kidney disease with stage 1 through stage 4 chronic kidney disease, or unspecified chronic kidney disease: Secondary | ICD-10-CM | POA: Diagnosis not present

## 2023-03-24 DIAGNOSIS — E039 Hypothyroidism, unspecified: Secondary | ICD-10-CM | POA: Diagnosis not present

## 2023-03-24 DIAGNOSIS — G894 Chronic pain syndrome: Secondary | ICD-10-CM | POA: Diagnosis not present

## 2023-03-24 DIAGNOSIS — Z993 Dependence on wheelchair: Secondary | ICD-10-CM | POA: Diagnosis not present

## 2023-03-24 DIAGNOSIS — S81802D Unspecified open wound, left lower leg, subsequent encounter: Secondary | ICD-10-CM | POA: Diagnosis not present

## 2023-03-24 DIAGNOSIS — D631 Anemia in chronic kidney disease: Secondary | ICD-10-CM | POA: Diagnosis not present

## 2023-03-24 DIAGNOSIS — E559 Vitamin D deficiency, unspecified: Secondary | ICD-10-CM | POA: Diagnosis not present

## 2023-03-24 DIAGNOSIS — G4700 Insomnia, unspecified: Secondary | ICD-10-CM | POA: Diagnosis not present

## 2023-03-24 DIAGNOSIS — Z7982 Long term (current) use of aspirin: Secondary | ICD-10-CM | POA: Diagnosis not present

## 2023-03-24 DIAGNOSIS — M069 Rheumatoid arthritis, unspecified: Secondary | ICD-10-CM | POA: Diagnosis not present

## 2023-03-25 DIAGNOSIS — J309 Allergic rhinitis, unspecified: Secondary | ICD-10-CM | POA: Diagnosis not present

## 2023-03-25 DIAGNOSIS — G3184 Mild cognitive impairment, so stated: Secondary | ICD-10-CM | POA: Diagnosis not present

## 2023-03-25 DIAGNOSIS — M4800 Spinal stenosis, site unspecified: Secondary | ICD-10-CM | POA: Diagnosis not present

## 2023-03-25 DIAGNOSIS — E1151 Type 2 diabetes mellitus with diabetic peripheral angiopathy without gangrene: Secondary | ICD-10-CM | POA: Diagnosis not present

## 2023-03-25 DIAGNOSIS — Z7952 Long term (current) use of systemic steroids: Secondary | ICD-10-CM | POA: Diagnosis not present

## 2023-03-25 DIAGNOSIS — I129 Hypertensive chronic kidney disease with stage 1 through stage 4 chronic kidney disease, or unspecified chronic kidney disease: Secondary | ICD-10-CM | POA: Diagnosis not present

## 2023-03-25 DIAGNOSIS — E1122 Type 2 diabetes mellitus with diabetic chronic kidney disease: Secondary | ICD-10-CM | POA: Diagnosis not present

## 2023-03-25 DIAGNOSIS — E039 Hypothyroidism, unspecified: Secondary | ICD-10-CM | POA: Diagnosis not present

## 2023-03-25 DIAGNOSIS — E785 Hyperlipidemia, unspecified: Secondary | ICD-10-CM | POA: Diagnosis not present

## 2023-03-25 DIAGNOSIS — M069 Rheumatoid arthritis, unspecified: Secondary | ICD-10-CM | POA: Diagnosis not present

## 2023-03-25 DIAGNOSIS — E559 Vitamin D deficiency, unspecified: Secondary | ICD-10-CM | POA: Diagnosis not present

## 2023-03-25 DIAGNOSIS — Z466 Encounter for fitting and adjustment of urinary device: Secondary | ICD-10-CM | POA: Diagnosis not present

## 2023-03-25 DIAGNOSIS — G894 Chronic pain syndrome: Secondary | ICD-10-CM | POA: Diagnosis not present

## 2023-03-25 DIAGNOSIS — N1831 Chronic kidney disease, stage 3a: Secondary | ICD-10-CM | POA: Diagnosis not present

## 2023-03-25 DIAGNOSIS — Z993 Dependence on wheelchair: Secondary | ICD-10-CM | POA: Diagnosis not present

## 2023-03-25 DIAGNOSIS — S81802D Unspecified open wound, left lower leg, subsequent encounter: Secondary | ICD-10-CM | POA: Diagnosis not present

## 2023-03-25 DIAGNOSIS — K5900 Constipation, unspecified: Secondary | ICD-10-CM | POA: Diagnosis not present

## 2023-03-25 DIAGNOSIS — Z7982 Long term (current) use of aspirin: Secondary | ICD-10-CM | POA: Diagnosis not present

## 2023-03-25 DIAGNOSIS — D631 Anemia in chronic kidney disease: Secondary | ICD-10-CM | POA: Diagnosis not present

## 2023-03-25 DIAGNOSIS — G4700 Insomnia, unspecified: Secondary | ICD-10-CM | POA: Diagnosis not present

## 2023-03-27 DIAGNOSIS — G3184 Mild cognitive impairment, so stated: Secondary | ICD-10-CM | POA: Diagnosis not present

## 2023-03-31 DIAGNOSIS — I129 Hypertensive chronic kidney disease with stage 1 through stage 4 chronic kidney disease, or unspecified chronic kidney disease: Secondary | ICD-10-CM | POA: Diagnosis not present

## 2023-03-31 DIAGNOSIS — E1151 Type 2 diabetes mellitus with diabetic peripheral angiopathy without gangrene: Secondary | ICD-10-CM | POA: Diagnosis not present

## 2023-03-31 DIAGNOSIS — N1831 Chronic kidney disease, stage 3a: Secondary | ICD-10-CM | POA: Diagnosis not present

## 2023-03-31 DIAGNOSIS — E559 Vitamin D deficiency, unspecified: Secondary | ICD-10-CM | POA: Diagnosis not present

## 2023-03-31 DIAGNOSIS — K5900 Constipation, unspecified: Secondary | ICD-10-CM | POA: Diagnosis not present

## 2023-03-31 DIAGNOSIS — M4800 Spinal stenosis, site unspecified: Secondary | ICD-10-CM | POA: Diagnosis not present

## 2023-03-31 DIAGNOSIS — G4700 Insomnia, unspecified: Secondary | ICD-10-CM | POA: Diagnosis not present

## 2023-03-31 DIAGNOSIS — E039 Hypothyroidism, unspecified: Secondary | ICD-10-CM | POA: Diagnosis not present

## 2023-03-31 DIAGNOSIS — M069 Rheumatoid arthritis, unspecified: Secondary | ICD-10-CM | POA: Diagnosis not present

## 2023-03-31 DIAGNOSIS — Z466 Encounter for fitting and adjustment of urinary device: Secondary | ICD-10-CM | POA: Diagnosis not present

## 2023-03-31 DIAGNOSIS — R1310 Dysphagia, unspecified: Secondary | ICD-10-CM | POA: Diagnosis not present

## 2023-03-31 DIAGNOSIS — J309 Allergic rhinitis, unspecified: Secondary | ICD-10-CM | POA: Diagnosis not present

## 2023-03-31 DIAGNOSIS — E1122 Type 2 diabetes mellitus with diabetic chronic kidney disease: Secondary | ICD-10-CM | POA: Diagnosis not present

## 2023-03-31 DIAGNOSIS — Z7952 Long term (current) use of systemic steroids: Secondary | ICD-10-CM | POA: Diagnosis not present

## 2023-03-31 DIAGNOSIS — G3184 Mild cognitive impairment, so stated: Secondary | ICD-10-CM | POA: Diagnosis not present

## 2023-03-31 DIAGNOSIS — E785 Hyperlipidemia, unspecified: Secondary | ICD-10-CM | POA: Diagnosis not present

## 2023-03-31 DIAGNOSIS — D631 Anemia in chronic kidney disease: Secondary | ICD-10-CM | POA: Diagnosis not present

## 2023-03-31 DIAGNOSIS — G894 Chronic pain syndrome: Secondary | ICD-10-CM | POA: Diagnosis not present

## 2023-03-31 DIAGNOSIS — S81802D Unspecified open wound, left lower leg, subsequent encounter: Secondary | ICD-10-CM | POA: Diagnosis not present

## 2023-04-01 DIAGNOSIS — E559 Vitamin D deficiency, unspecified: Secondary | ICD-10-CM | POA: Diagnosis not present

## 2023-04-01 DIAGNOSIS — I129 Hypertensive chronic kidney disease with stage 1 through stage 4 chronic kidney disease, or unspecified chronic kidney disease: Secondary | ICD-10-CM | POA: Diagnosis not present

## 2023-04-01 DIAGNOSIS — G4700 Insomnia, unspecified: Secondary | ICD-10-CM | POA: Diagnosis not present

## 2023-04-01 DIAGNOSIS — D631 Anemia in chronic kidney disease: Secondary | ICD-10-CM | POA: Diagnosis not present

## 2023-04-01 DIAGNOSIS — E1122 Type 2 diabetes mellitus with diabetic chronic kidney disease: Secondary | ICD-10-CM | POA: Diagnosis not present

## 2023-04-01 DIAGNOSIS — E1151 Type 2 diabetes mellitus with diabetic peripheral angiopathy without gangrene: Secondary | ICD-10-CM | POA: Diagnosis not present

## 2023-04-01 DIAGNOSIS — E785 Hyperlipidemia, unspecified: Secondary | ICD-10-CM | POA: Diagnosis not present

## 2023-04-01 DIAGNOSIS — G894 Chronic pain syndrome: Secondary | ICD-10-CM | POA: Diagnosis not present

## 2023-04-01 DIAGNOSIS — M4800 Spinal stenosis, site unspecified: Secondary | ICD-10-CM | POA: Diagnosis not present

## 2023-04-01 DIAGNOSIS — E039 Hypothyroidism, unspecified: Secondary | ICD-10-CM | POA: Diagnosis not present

## 2023-04-01 DIAGNOSIS — Z7952 Long term (current) use of systemic steroids: Secondary | ICD-10-CM | POA: Diagnosis not present

## 2023-04-01 DIAGNOSIS — N1831 Chronic kidney disease, stage 3a: Secondary | ICD-10-CM | POA: Diagnosis not present

## 2023-04-01 DIAGNOSIS — Z466 Encounter for fitting and adjustment of urinary device: Secondary | ICD-10-CM | POA: Diagnosis not present

## 2023-04-01 DIAGNOSIS — R600 Localized edema: Secondary | ICD-10-CM | POA: Diagnosis not present

## 2023-04-01 DIAGNOSIS — R1310 Dysphagia, unspecified: Secondary | ICD-10-CM | POA: Diagnosis not present

## 2023-04-01 DIAGNOSIS — M069 Rheumatoid arthritis, unspecified: Secondary | ICD-10-CM | POA: Diagnosis not present

## 2023-04-01 DIAGNOSIS — J309 Allergic rhinitis, unspecified: Secondary | ICD-10-CM | POA: Diagnosis not present

## 2023-04-01 DIAGNOSIS — S81802D Unspecified open wound, left lower leg, subsequent encounter: Secondary | ICD-10-CM | POA: Diagnosis not present

## 2023-04-01 DIAGNOSIS — K5900 Constipation, unspecified: Secondary | ICD-10-CM | POA: Diagnosis not present

## 2023-04-01 DIAGNOSIS — G3184 Mild cognitive impairment, so stated: Secondary | ICD-10-CM | POA: Diagnosis not present

## 2023-04-02 DIAGNOSIS — I10 Essential (primary) hypertension: Secondary | ICD-10-CM | POA: Diagnosis not present

## 2023-04-03 DIAGNOSIS — E039 Hypothyroidism, unspecified: Secondary | ICD-10-CM | POA: Diagnosis not present

## 2023-04-03 DIAGNOSIS — I129 Hypertensive chronic kidney disease with stage 1 through stage 4 chronic kidney disease, or unspecified chronic kidney disease: Secondary | ICD-10-CM | POA: Diagnosis not present

## 2023-04-03 DIAGNOSIS — E785 Hyperlipidemia, unspecified: Secondary | ICD-10-CM | POA: Diagnosis not present

## 2023-04-03 DIAGNOSIS — M4800 Spinal stenosis, site unspecified: Secondary | ICD-10-CM | POA: Diagnosis not present

## 2023-04-03 DIAGNOSIS — K5900 Constipation, unspecified: Secondary | ICD-10-CM | POA: Diagnosis not present

## 2023-04-03 DIAGNOSIS — E1122 Type 2 diabetes mellitus with diabetic chronic kidney disease: Secondary | ICD-10-CM | POA: Diagnosis not present

## 2023-04-03 DIAGNOSIS — G4700 Insomnia, unspecified: Secondary | ICD-10-CM | POA: Diagnosis not present

## 2023-04-03 DIAGNOSIS — G3184 Mild cognitive impairment, so stated: Secondary | ICD-10-CM | POA: Diagnosis not present

## 2023-04-03 DIAGNOSIS — S81802D Unspecified open wound, left lower leg, subsequent encounter: Secondary | ICD-10-CM | POA: Diagnosis not present

## 2023-04-03 DIAGNOSIS — J309 Allergic rhinitis, unspecified: Secondary | ICD-10-CM | POA: Diagnosis not present

## 2023-04-03 DIAGNOSIS — Z7952 Long term (current) use of systemic steroids: Secondary | ICD-10-CM | POA: Diagnosis not present

## 2023-04-03 DIAGNOSIS — D631 Anemia in chronic kidney disease: Secondary | ICD-10-CM | POA: Diagnosis not present

## 2023-04-03 DIAGNOSIS — R1310 Dysphagia, unspecified: Secondary | ICD-10-CM | POA: Diagnosis not present

## 2023-04-03 DIAGNOSIS — E559 Vitamin D deficiency, unspecified: Secondary | ICD-10-CM | POA: Diagnosis not present

## 2023-04-03 DIAGNOSIS — E1151 Type 2 diabetes mellitus with diabetic peripheral angiopathy without gangrene: Secondary | ICD-10-CM | POA: Diagnosis not present

## 2023-04-03 DIAGNOSIS — Z466 Encounter for fitting and adjustment of urinary device: Secondary | ICD-10-CM | POA: Diagnosis not present

## 2023-04-03 DIAGNOSIS — N1831 Chronic kidney disease, stage 3a: Secondary | ICD-10-CM | POA: Diagnosis not present

## 2023-04-03 DIAGNOSIS — M069 Rheumatoid arthritis, unspecified: Secondary | ICD-10-CM | POA: Diagnosis not present

## 2023-04-03 DIAGNOSIS — G894 Chronic pain syndrome: Secondary | ICD-10-CM | POA: Diagnosis not present

## 2023-04-07 DIAGNOSIS — G894 Chronic pain syndrome: Secondary | ICD-10-CM | POA: Diagnosis not present

## 2023-04-07 DIAGNOSIS — M4800 Spinal stenosis, site unspecified: Secondary | ICD-10-CM | POA: Diagnosis not present

## 2023-04-07 DIAGNOSIS — R1310 Dysphagia, unspecified: Secondary | ICD-10-CM | POA: Diagnosis not present

## 2023-04-07 DIAGNOSIS — Z7952 Long term (current) use of systemic steroids: Secondary | ICD-10-CM | POA: Diagnosis not present

## 2023-04-07 DIAGNOSIS — E1122 Type 2 diabetes mellitus with diabetic chronic kidney disease: Secondary | ICD-10-CM | POA: Diagnosis not present

## 2023-04-07 DIAGNOSIS — I129 Hypertensive chronic kidney disease with stage 1 through stage 4 chronic kidney disease, or unspecified chronic kidney disease: Secondary | ICD-10-CM | POA: Diagnosis not present

## 2023-04-07 DIAGNOSIS — E785 Hyperlipidemia, unspecified: Secondary | ICD-10-CM | POA: Diagnosis not present

## 2023-04-07 DIAGNOSIS — E039 Hypothyroidism, unspecified: Secondary | ICD-10-CM | POA: Diagnosis not present

## 2023-04-07 DIAGNOSIS — M069 Rheumatoid arthritis, unspecified: Secondary | ICD-10-CM | POA: Diagnosis not present

## 2023-04-07 DIAGNOSIS — S81802D Unspecified open wound, left lower leg, subsequent encounter: Secondary | ICD-10-CM | POA: Diagnosis not present

## 2023-04-07 DIAGNOSIS — E559 Vitamin D deficiency, unspecified: Secondary | ICD-10-CM | POA: Diagnosis not present

## 2023-04-07 DIAGNOSIS — G4700 Insomnia, unspecified: Secondary | ICD-10-CM | POA: Diagnosis not present

## 2023-04-07 DIAGNOSIS — Z466 Encounter for fitting and adjustment of urinary device: Secondary | ICD-10-CM | POA: Diagnosis not present

## 2023-04-07 DIAGNOSIS — N1831 Chronic kidney disease, stage 3a: Secondary | ICD-10-CM | POA: Diagnosis not present

## 2023-04-07 DIAGNOSIS — J309 Allergic rhinitis, unspecified: Secondary | ICD-10-CM | POA: Diagnosis not present

## 2023-04-07 DIAGNOSIS — K5900 Constipation, unspecified: Secondary | ICD-10-CM | POA: Diagnosis not present

## 2023-04-07 DIAGNOSIS — G3184 Mild cognitive impairment, so stated: Secondary | ICD-10-CM | POA: Diagnosis not present

## 2023-04-07 DIAGNOSIS — E1151 Type 2 diabetes mellitus with diabetic peripheral angiopathy without gangrene: Secondary | ICD-10-CM | POA: Diagnosis not present

## 2023-04-07 DIAGNOSIS — D631 Anemia in chronic kidney disease: Secondary | ICD-10-CM | POA: Diagnosis not present

## 2023-04-10 DIAGNOSIS — R1310 Dysphagia, unspecified: Secondary | ICD-10-CM | POA: Diagnosis not present

## 2023-04-10 DIAGNOSIS — E785 Hyperlipidemia, unspecified: Secondary | ICD-10-CM | POA: Diagnosis not present

## 2023-04-10 DIAGNOSIS — E1122 Type 2 diabetes mellitus with diabetic chronic kidney disease: Secondary | ICD-10-CM | POA: Diagnosis not present

## 2023-04-10 DIAGNOSIS — G3184 Mild cognitive impairment, so stated: Secondary | ICD-10-CM | POA: Diagnosis not present

## 2023-04-10 DIAGNOSIS — M4800 Spinal stenosis, site unspecified: Secondary | ICD-10-CM | POA: Diagnosis not present

## 2023-04-10 DIAGNOSIS — G4700 Insomnia, unspecified: Secondary | ICD-10-CM | POA: Diagnosis not present

## 2023-04-10 DIAGNOSIS — K5900 Constipation, unspecified: Secondary | ICD-10-CM | POA: Diagnosis not present

## 2023-04-10 DIAGNOSIS — G894 Chronic pain syndrome: Secondary | ICD-10-CM | POA: Diagnosis not present

## 2023-04-10 DIAGNOSIS — I129 Hypertensive chronic kidney disease with stage 1 through stage 4 chronic kidney disease, or unspecified chronic kidney disease: Secondary | ICD-10-CM | POA: Diagnosis not present

## 2023-04-10 DIAGNOSIS — J309 Allergic rhinitis, unspecified: Secondary | ICD-10-CM | POA: Diagnosis not present

## 2023-04-10 DIAGNOSIS — E559 Vitamin D deficiency, unspecified: Secondary | ICD-10-CM | POA: Diagnosis not present

## 2023-04-10 DIAGNOSIS — E039 Hypothyroidism, unspecified: Secondary | ICD-10-CM | POA: Diagnosis not present

## 2023-04-10 DIAGNOSIS — M069 Rheumatoid arthritis, unspecified: Secondary | ICD-10-CM | POA: Diagnosis not present

## 2023-04-10 DIAGNOSIS — S81802D Unspecified open wound, left lower leg, subsequent encounter: Secondary | ICD-10-CM | POA: Diagnosis not present

## 2023-04-10 DIAGNOSIS — Z466 Encounter for fitting and adjustment of urinary device: Secondary | ICD-10-CM | POA: Diagnosis not present

## 2023-04-10 DIAGNOSIS — D631 Anemia in chronic kidney disease: Secondary | ICD-10-CM | POA: Diagnosis not present

## 2023-04-10 DIAGNOSIS — E1151 Type 2 diabetes mellitus with diabetic peripheral angiopathy without gangrene: Secondary | ICD-10-CM | POA: Diagnosis not present

## 2023-04-10 DIAGNOSIS — N1831 Chronic kidney disease, stage 3a: Secondary | ICD-10-CM | POA: Diagnosis not present

## 2023-04-10 DIAGNOSIS — Z7952 Long term (current) use of systemic steroids: Secondary | ICD-10-CM | POA: Diagnosis not present

## 2023-04-14 DIAGNOSIS — R1310 Dysphagia, unspecified: Secondary | ICD-10-CM | POA: Diagnosis not present

## 2023-04-14 DIAGNOSIS — G3184 Mild cognitive impairment, so stated: Secondary | ICD-10-CM | POA: Diagnosis not present

## 2023-04-14 DIAGNOSIS — E1151 Type 2 diabetes mellitus with diabetic peripheral angiopathy without gangrene: Secondary | ICD-10-CM | POA: Diagnosis not present

## 2023-04-14 DIAGNOSIS — J309 Allergic rhinitis, unspecified: Secondary | ICD-10-CM | POA: Diagnosis not present

## 2023-04-14 DIAGNOSIS — I129 Hypertensive chronic kidney disease with stage 1 through stage 4 chronic kidney disease, or unspecified chronic kidney disease: Secondary | ICD-10-CM | POA: Diagnosis not present

## 2023-04-14 DIAGNOSIS — E1122 Type 2 diabetes mellitus with diabetic chronic kidney disease: Secondary | ICD-10-CM | POA: Diagnosis not present

## 2023-04-14 DIAGNOSIS — Z466 Encounter for fitting and adjustment of urinary device: Secondary | ICD-10-CM | POA: Diagnosis not present

## 2023-04-14 DIAGNOSIS — K5900 Constipation, unspecified: Secondary | ICD-10-CM | POA: Diagnosis not present

## 2023-04-14 DIAGNOSIS — E559 Vitamin D deficiency, unspecified: Secondary | ICD-10-CM | POA: Diagnosis not present

## 2023-04-14 DIAGNOSIS — S81802D Unspecified open wound, left lower leg, subsequent encounter: Secondary | ICD-10-CM | POA: Diagnosis not present

## 2023-04-14 DIAGNOSIS — M069 Rheumatoid arthritis, unspecified: Secondary | ICD-10-CM | POA: Diagnosis not present

## 2023-04-14 DIAGNOSIS — Z7952 Long term (current) use of systemic steroids: Secondary | ICD-10-CM | POA: Diagnosis not present

## 2023-04-14 DIAGNOSIS — G4700 Insomnia, unspecified: Secondary | ICD-10-CM | POA: Diagnosis not present

## 2023-04-14 DIAGNOSIS — N1831 Chronic kidney disease, stage 3a: Secondary | ICD-10-CM | POA: Diagnosis not present

## 2023-04-14 DIAGNOSIS — D631 Anemia in chronic kidney disease: Secondary | ICD-10-CM | POA: Diagnosis not present

## 2023-04-14 DIAGNOSIS — M4800 Spinal stenosis, site unspecified: Secondary | ICD-10-CM | POA: Diagnosis not present

## 2023-04-14 DIAGNOSIS — G894 Chronic pain syndrome: Secondary | ICD-10-CM | POA: Diagnosis not present

## 2023-04-14 DIAGNOSIS — E039 Hypothyroidism, unspecified: Secondary | ICD-10-CM | POA: Diagnosis not present

## 2023-04-14 DIAGNOSIS — E785 Hyperlipidemia, unspecified: Secondary | ICD-10-CM | POA: Diagnosis not present

## 2023-04-21 DIAGNOSIS — D631 Anemia in chronic kidney disease: Secondary | ICD-10-CM | POA: Diagnosis not present

## 2023-04-21 DIAGNOSIS — J309 Allergic rhinitis, unspecified: Secondary | ICD-10-CM | POA: Diagnosis not present

## 2023-04-21 DIAGNOSIS — Z7952 Long term (current) use of systemic steroids: Secondary | ICD-10-CM | POA: Diagnosis not present

## 2023-04-21 DIAGNOSIS — G3184 Mild cognitive impairment, so stated: Secondary | ICD-10-CM | POA: Diagnosis not present

## 2023-04-21 DIAGNOSIS — E1122 Type 2 diabetes mellitus with diabetic chronic kidney disease: Secondary | ICD-10-CM | POA: Diagnosis not present

## 2023-04-21 DIAGNOSIS — M4800 Spinal stenosis, site unspecified: Secondary | ICD-10-CM | POA: Diagnosis not present

## 2023-04-21 DIAGNOSIS — S81802D Unspecified open wound, left lower leg, subsequent encounter: Secondary | ICD-10-CM | POA: Diagnosis not present

## 2023-04-21 DIAGNOSIS — E559 Vitamin D deficiency, unspecified: Secondary | ICD-10-CM | POA: Diagnosis not present

## 2023-04-21 DIAGNOSIS — G894 Chronic pain syndrome: Secondary | ICD-10-CM | POA: Diagnosis not present

## 2023-04-21 DIAGNOSIS — R1310 Dysphagia, unspecified: Secondary | ICD-10-CM | POA: Diagnosis not present

## 2023-04-21 DIAGNOSIS — E785 Hyperlipidemia, unspecified: Secondary | ICD-10-CM | POA: Diagnosis not present

## 2023-04-21 DIAGNOSIS — M069 Rheumatoid arthritis, unspecified: Secondary | ICD-10-CM | POA: Diagnosis not present

## 2023-04-21 DIAGNOSIS — Z466 Encounter for fitting and adjustment of urinary device: Secondary | ICD-10-CM | POA: Diagnosis not present

## 2023-04-21 DIAGNOSIS — I129 Hypertensive chronic kidney disease with stage 1 through stage 4 chronic kidney disease, or unspecified chronic kidney disease: Secondary | ICD-10-CM | POA: Diagnosis not present

## 2023-04-21 DIAGNOSIS — E039 Hypothyroidism, unspecified: Secondary | ICD-10-CM | POA: Diagnosis not present

## 2023-04-21 DIAGNOSIS — N1831 Chronic kidney disease, stage 3a: Secondary | ICD-10-CM | POA: Diagnosis not present

## 2023-04-21 DIAGNOSIS — K5900 Constipation, unspecified: Secondary | ICD-10-CM | POA: Diagnosis not present

## 2023-04-21 DIAGNOSIS — G4700 Insomnia, unspecified: Secondary | ICD-10-CM | POA: Diagnosis not present

## 2023-04-21 DIAGNOSIS — E1151 Type 2 diabetes mellitus with diabetic peripheral angiopathy without gangrene: Secondary | ICD-10-CM | POA: Diagnosis not present

## 2023-04-24 DIAGNOSIS — E1122 Type 2 diabetes mellitus with diabetic chronic kidney disease: Secondary | ICD-10-CM | POA: Diagnosis not present

## 2023-04-24 DIAGNOSIS — E039 Hypothyroidism, unspecified: Secondary | ICD-10-CM | POA: Diagnosis not present

## 2023-04-24 DIAGNOSIS — G894 Chronic pain syndrome: Secondary | ICD-10-CM | POA: Diagnosis not present

## 2023-04-24 DIAGNOSIS — M4800 Spinal stenosis, site unspecified: Secondary | ICD-10-CM | POA: Diagnosis not present

## 2023-04-24 DIAGNOSIS — E785 Hyperlipidemia, unspecified: Secondary | ICD-10-CM | POA: Diagnosis not present

## 2023-04-24 DIAGNOSIS — Z466 Encounter for fitting and adjustment of urinary device: Secondary | ICD-10-CM | POA: Diagnosis not present

## 2023-04-24 DIAGNOSIS — Z7952 Long term (current) use of systemic steroids: Secondary | ICD-10-CM | POA: Diagnosis not present

## 2023-04-24 DIAGNOSIS — G4700 Insomnia, unspecified: Secondary | ICD-10-CM | POA: Diagnosis not present

## 2023-04-24 DIAGNOSIS — G3184 Mild cognitive impairment, so stated: Secondary | ICD-10-CM | POA: Diagnosis not present

## 2023-04-24 DIAGNOSIS — R1310 Dysphagia, unspecified: Secondary | ICD-10-CM | POA: Diagnosis not present

## 2023-04-24 DIAGNOSIS — D631 Anemia in chronic kidney disease: Secondary | ICD-10-CM | POA: Diagnosis not present

## 2023-04-24 DIAGNOSIS — J309 Allergic rhinitis, unspecified: Secondary | ICD-10-CM | POA: Diagnosis not present

## 2023-04-24 DIAGNOSIS — M069 Rheumatoid arthritis, unspecified: Secondary | ICD-10-CM | POA: Diagnosis not present

## 2023-04-24 DIAGNOSIS — K5900 Constipation, unspecified: Secondary | ICD-10-CM | POA: Diagnosis not present

## 2023-04-24 DIAGNOSIS — E559 Vitamin D deficiency, unspecified: Secondary | ICD-10-CM | POA: Diagnosis not present

## 2023-04-24 DIAGNOSIS — E1151 Type 2 diabetes mellitus with diabetic peripheral angiopathy without gangrene: Secondary | ICD-10-CM | POA: Diagnosis not present

## 2023-04-24 DIAGNOSIS — S81802D Unspecified open wound, left lower leg, subsequent encounter: Secondary | ICD-10-CM | POA: Diagnosis not present

## 2023-04-24 DIAGNOSIS — N1831 Chronic kidney disease, stage 3a: Secondary | ICD-10-CM | POA: Diagnosis not present

## 2023-04-24 DIAGNOSIS — I129 Hypertensive chronic kidney disease with stage 1 through stage 4 chronic kidney disease, or unspecified chronic kidney disease: Secondary | ICD-10-CM | POA: Diagnosis not present

## 2023-04-27 DIAGNOSIS — M059 Rheumatoid arthritis with rheumatoid factor, unspecified: Secondary | ICD-10-CM | POA: Diagnosis not present

## 2023-04-27 DIAGNOSIS — G4700 Insomnia, unspecified: Secondary | ICD-10-CM | POA: Diagnosis not present

## 2023-04-27 DIAGNOSIS — J309 Allergic rhinitis, unspecified: Secondary | ICD-10-CM | POA: Diagnosis not present

## 2023-04-27 DIAGNOSIS — N1831 Chronic kidney disease, stage 3a: Secondary | ICD-10-CM | POA: Diagnosis not present

## 2023-04-27 DIAGNOSIS — E785 Hyperlipidemia, unspecified: Secondary | ICD-10-CM | POA: Diagnosis not present

## 2023-04-27 DIAGNOSIS — E039 Hypothyroidism, unspecified: Secondary | ICD-10-CM | POA: Diagnosis not present

## 2023-04-27 DIAGNOSIS — I872 Venous insufficiency (chronic) (peripheral): Secondary | ICD-10-CM | POA: Diagnosis not present

## 2023-04-27 DIAGNOSIS — R1310 Dysphagia, unspecified: Secondary | ICD-10-CM | POA: Diagnosis not present

## 2023-04-27 DIAGNOSIS — M069 Rheumatoid arthritis, unspecified: Secondary | ICD-10-CM | POA: Diagnosis not present

## 2023-04-27 DIAGNOSIS — S81802D Unspecified open wound, left lower leg, subsequent encounter: Secondary | ICD-10-CM | POA: Diagnosis not present

## 2023-04-27 DIAGNOSIS — G3184 Mild cognitive impairment, so stated: Secondary | ICD-10-CM | POA: Diagnosis not present

## 2023-04-27 DIAGNOSIS — E559 Vitamin D deficiency, unspecified: Secondary | ICD-10-CM | POA: Diagnosis not present

## 2023-04-27 DIAGNOSIS — G894 Chronic pain syndrome: Secondary | ICD-10-CM | POA: Diagnosis not present

## 2023-04-27 DIAGNOSIS — E1151 Type 2 diabetes mellitus with diabetic peripheral angiopathy without gangrene: Secondary | ICD-10-CM | POA: Diagnosis not present

## 2023-04-27 DIAGNOSIS — I129 Hypertensive chronic kidney disease with stage 1 through stage 4 chronic kidney disease, or unspecified chronic kidney disease: Secondary | ICD-10-CM | POA: Diagnosis not present

## 2023-04-27 DIAGNOSIS — E1122 Type 2 diabetes mellitus with diabetic chronic kidney disease: Secondary | ICD-10-CM | POA: Diagnosis not present

## 2023-04-27 DIAGNOSIS — D631 Anemia in chronic kidney disease: Secondary | ICD-10-CM | POA: Diagnosis not present

## 2023-04-27 DIAGNOSIS — Z7952 Long term (current) use of systemic steroids: Secondary | ICD-10-CM | POA: Diagnosis not present

## 2023-04-27 DIAGNOSIS — K5900 Constipation, unspecified: Secondary | ICD-10-CM | POA: Diagnosis not present

## 2023-04-27 DIAGNOSIS — Z466 Encounter for fitting and adjustment of urinary device: Secondary | ICD-10-CM | POA: Diagnosis not present

## 2023-04-27 DIAGNOSIS — Z993 Dependence on wheelchair: Secondary | ICD-10-CM | POA: Diagnosis not present

## 2023-04-27 DIAGNOSIS — M4800 Spinal stenosis, site unspecified: Secondary | ICD-10-CM | POA: Diagnosis not present

## 2023-04-27 DIAGNOSIS — R600 Localized edema: Secondary | ICD-10-CM | POA: Diagnosis not present

## 2023-04-28 DIAGNOSIS — E1122 Type 2 diabetes mellitus with diabetic chronic kidney disease: Secondary | ICD-10-CM | POA: Diagnosis not present

## 2023-04-28 DIAGNOSIS — G3184 Mild cognitive impairment, so stated: Secondary | ICD-10-CM | POA: Diagnosis not present

## 2023-04-28 DIAGNOSIS — E039 Hypothyroidism, unspecified: Secondary | ICD-10-CM | POA: Diagnosis not present

## 2023-04-28 DIAGNOSIS — G4700 Insomnia, unspecified: Secondary | ICD-10-CM | POA: Diagnosis not present

## 2023-04-28 DIAGNOSIS — D631 Anemia in chronic kidney disease: Secondary | ICD-10-CM | POA: Diagnosis not present

## 2023-04-28 DIAGNOSIS — M069 Rheumatoid arthritis, unspecified: Secondary | ICD-10-CM | POA: Diagnosis not present

## 2023-04-28 DIAGNOSIS — E1151 Type 2 diabetes mellitus with diabetic peripheral angiopathy without gangrene: Secondary | ICD-10-CM | POA: Diagnosis not present

## 2023-04-28 DIAGNOSIS — J309 Allergic rhinitis, unspecified: Secondary | ICD-10-CM | POA: Diagnosis not present

## 2023-04-28 DIAGNOSIS — K5900 Constipation, unspecified: Secondary | ICD-10-CM | POA: Diagnosis not present

## 2023-04-28 DIAGNOSIS — Z466 Encounter for fitting and adjustment of urinary device: Secondary | ICD-10-CM | POA: Diagnosis not present

## 2023-04-28 DIAGNOSIS — Z7952 Long term (current) use of systemic steroids: Secondary | ICD-10-CM | POA: Diagnosis not present

## 2023-04-28 DIAGNOSIS — N1831 Chronic kidney disease, stage 3a: Secondary | ICD-10-CM | POA: Diagnosis not present

## 2023-04-28 DIAGNOSIS — M4800 Spinal stenosis, site unspecified: Secondary | ICD-10-CM | POA: Diagnosis not present

## 2023-04-28 DIAGNOSIS — R1310 Dysphagia, unspecified: Secondary | ICD-10-CM | POA: Diagnosis not present

## 2023-04-28 DIAGNOSIS — G894 Chronic pain syndrome: Secondary | ICD-10-CM | POA: Diagnosis not present

## 2023-04-28 DIAGNOSIS — I129 Hypertensive chronic kidney disease with stage 1 through stage 4 chronic kidney disease, or unspecified chronic kidney disease: Secondary | ICD-10-CM | POA: Diagnosis not present

## 2023-04-28 DIAGNOSIS — E785 Hyperlipidemia, unspecified: Secondary | ICD-10-CM | POA: Diagnosis not present

## 2023-04-28 DIAGNOSIS — S81802D Unspecified open wound, left lower leg, subsequent encounter: Secondary | ICD-10-CM | POA: Diagnosis not present

## 2023-04-28 DIAGNOSIS — E559 Vitamin D deficiency, unspecified: Secondary | ICD-10-CM | POA: Diagnosis not present

## 2023-04-29 DIAGNOSIS — N1832 Chronic kidney disease, stage 3b: Secondary | ICD-10-CM | POA: Diagnosis not present

## 2023-04-29 DIAGNOSIS — I739 Peripheral vascular disease, unspecified: Secondary | ICD-10-CM | POA: Diagnosis not present

## 2023-04-29 DIAGNOSIS — I129 Hypertensive chronic kidney disease with stage 1 through stage 4 chronic kidney disease, or unspecified chronic kidney disease: Secondary | ICD-10-CM | POA: Diagnosis not present

## 2023-04-29 DIAGNOSIS — F5101 Primary insomnia: Secondary | ICD-10-CM | POA: Diagnosis not present

## 2023-04-30 DIAGNOSIS — M069 Rheumatoid arthritis, unspecified: Secondary | ICD-10-CM | POA: Diagnosis not present

## 2023-04-30 DIAGNOSIS — E785 Hyperlipidemia, unspecified: Secondary | ICD-10-CM | POA: Diagnosis not present

## 2023-04-30 DIAGNOSIS — E559 Vitamin D deficiency, unspecified: Secondary | ICD-10-CM | POA: Diagnosis not present

## 2023-04-30 DIAGNOSIS — N1831 Chronic kidney disease, stage 3a: Secondary | ICD-10-CM | POA: Diagnosis not present

## 2023-04-30 DIAGNOSIS — E039 Hypothyroidism, unspecified: Secondary | ICD-10-CM | POA: Diagnosis not present

## 2023-04-30 DIAGNOSIS — G894 Chronic pain syndrome: Secondary | ICD-10-CM | POA: Diagnosis not present

## 2023-04-30 DIAGNOSIS — G3184 Mild cognitive impairment, so stated: Secondary | ICD-10-CM | POA: Diagnosis not present

## 2023-04-30 DIAGNOSIS — D631 Anemia in chronic kidney disease: Secondary | ICD-10-CM | POA: Diagnosis not present

## 2023-04-30 DIAGNOSIS — R1310 Dysphagia, unspecified: Secondary | ICD-10-CM | POA: Diagnosis not present

## 2023-04-30 DIAGNOSIS — E1151 Type 2 diabetes mellitus with diabetic peripheral angiopathy without gangrene: Secondary | ICD-10-CM | POA: Diagnosis not present

## 2023-04-30 DIAGNOSIS — Z466 Encounter for fitting and adjustment of urinary device: Secondary | ICD-10-CM | POA: Diagnosis not present

## 2023-04-30 DIAGNOSIS — M4800 Spinal stenosis, site unspecified: Secondary | ICD-10-CM | POA: Diagnosis not present

## 2023-04-30 DIAGNOSIS — K5900 Constipation, unspecified: Secondary | ICD-10-CM | POA: Diagnosis not present

## 2023-04-30 DIAGNOSIS — J309 Allergic rhinitis, unspecified: Secondary | ICD-10-CM | POA: Diagnosis not present

## 2023-04-30 DIAGNOSIS — E1122 Type 2 diabetes mellitus with diabetic chronic kidney disease: Secondary | ICD-10-CM | POA: Diagnosis not present

## 2023-04-30 DIAGNOSIS — Z7952 Long term (current) use of systemic steroids: Secondary | ICD-10-CM | POA: Diagnosis not present

## 2023-04-30 DIAGNOSIS — G4700 Insomnia, unspecified: Secondary | ICD-10-CM | POA: Diagnosis not present

## 2023-04-30 DIAGNOSIS — I129 Hypertensive chronic kidney disease with stage 1 through stage 4 chronic kidney disease, or unspecified chronic kidney disease: Secondary | ICD-10-CM | POA: Diagnosis not present

## 2023-04-30 DIAGNOSIS — S81802D Unspecified open wound, left lower leg, subsequent encounter: Secondary | ICD-10-CM | POA: Diagnosis not present

## 2023-05-04 DIAGNOSIS — N1831 Chronic kidney disease, stage 3a: Secondary | ICD-10-CM | POA: Diagnosis not present

## 2023-05-04 DIAGNOSIS — I129 Hypertensive chronic kidney disease with stage 1 through stage 4 chronic kidney disease, or unspecified chronic kidney disease: Secondary | ICD-10-CM | POA: Diagnosis not present

## 2023-05-04 DIAGNOSIS — E1151 Type 2 diabetes mellitus with diabetic peripheral angiopathy without gangrene: Secondary | ICD-10-CM | POA: Diagnosis not present

## 2023-05-04 DIAGNOSIS — S81802D Unspecified open wound, left lower leg, subsequent encounter: Secondary | ICD-10-CM | POA: Diagnosis not present

## 2023-05-04 DIAGNOSIS — G3184 Mild cognitive impairment, so stated: Secondary | ICD-10-CM | POA: Diagnosis not present

## 2023-05-04 DIAGNOSIS — M069 Rheumatoid arthritis, unspecified: Secondary | ICD-10-CM | POA: Diagnosis not present

## 2023-05-04 DIAGNOSIS — G4700 Insomnia, unspecified: Secondary | ICD-10-CM | POA: Diagnosis not present

## 2023-05-04 DIAGNOSIS — E1122 Type 2 diabetes mellitus with diabetic chronic kidney disease: Secondary | ICD-10-CM | POA: Diagnosis not present

## 2023-05-04 DIAGNOSIS — E785 Hyperlipidemia, unspecified: Secondary | ICD-10-CM | POA: Diagnosis not present

## 2023-05-04 DIAGNOSIS — Z466 Encounter for fitting and adjustment of urinary device: Secondary | ICD-10-CM | POA: Diagnosis not present

## 2023-05-04 DIAGNOSIS — Z7952 Long term (current) use of systemic steroids: Secondary | ICD-10-CM | POA: Diagnosis not present

## 2023-05-04 DIAGNOSIS — G894 Chronic pain syndrome: Secondary | ICD-10-CM | POA: Diagnosis not present

## 2023-05-04 DIAGNOSIS — R1310 Dysphagia, unspecified: Secondary | ICD-10-CM | POA: Diagnosis not present

## 2023-05-04 DIAGNOSIS — K5900 Constipation, unspecified: Secondary | ICD-10-CM | POA: Diagnosis not present

## 2023-05-04 DIAGNOSIS — J309 Allergic rhinitis, unspecified: Secondary | ICD-10-CM | POA: Diagnosis not present

## 2023-05-04 DIAGNOSIS — M4800 Spinal stenosis, site unspecified: Secondary | ICD-10-CM | POA: Diagnosis not present

## 2023-05-04 DIAGNOSIS — E559 Vitamin D deficiency, unspecified: Secondary | ICD-10-CM | POA: Diagnosis not present

## 2023-05-04 DIAGNOSIS — D631 Anemia in chronic kidney disease: Secondary | ICD-10-CM | POA: Diagnosis not present

## 2023-05-04 DIAGNOSIS — E039 Hypothyroidism, unspecified: Secondary | ICD-10-CM | POA: Diagnosis not present

## 2023-05-05 DIAGNOSIS — R1310 Dysphagia, unspecified: Secondary | ICD-10-CM | POA: Diagnosis not present

## 2023-05-05 DIAGNOSIS — S81802D Unspecified open wound, left lower leg, subsequent encounter: Secondary | ICD-10-CM | POA: Diagnosis not present

## 2023-05-05 DIAGNOSIS — G4700 Insomnia, unspecified: Secondary | ICD-10-CM | POA: Diagnosis not present

## 2023-05-05 DIAGNOSIS — M4800 Spinal stenosis, site unspecified: Secondary | ICD-10-CM | POA: Diagnosis not present

## 2023-05-05 DIAGNOSIS — E559 Vitamin D deficiency, unspecified: Secondary | ICD-10-CM | POA: Diagnosis not present

## 2023-05-05 DIAGNOSIS — E1122 Type 2 diabetes mellitus with diabetic chronic kidney disease: Secondary | ICD-10-CM | POA: Diagnosis not present

## 2023-05-05 DIAGNOSIS — E785 Hyperlipidemia, unspecified: Secondary | ICD-10-CM | POA: Diagnosis not present

## 2023-05-05 DIAGNOSIS — Z7952 Long term (current) use of systemic steroids: Secondary | ICD-10-CM | POA: Diagnosis not present

## 2023-05-05 DIAGNOSIS — N1831 Chronic kidney disease, stage 3a: Secondary | ICD-10-CM | POA: Diagnosis not present

## 2023-05-05 DIAGNOSIS — M069 Rheumatoid arthritis, unspecified: Secondary | ICD-10-CM | POA: Diagnosis not present

## 2023-05-05 DIAGNOSIS — E039 Hypothyroidism, unspecified: Secondary | ICD-10-CM | POA: Diagnosis not present

## 2023-05-05 DIAGNOSIS — I129 Hypertensive chronic kidney disease with stage 1 through stage 4 chronic kidney disease, or unspecified chronic kidney disease: Secondary | ICD-10-CM | POA: Diagnosis not present

## 2023-05-05 DIAGNOSIS — E1151 Type 2 diabetes mellitus with diabetic peripheral angiopathy without gangrene: Secondary | ICD-10-CM | POA: Diagnosis not present

## 2023-05-05 DIAGNOSIS — G3184 Mild cognitive impairment, so stated: Secondary | ICD-10-CM | POA: Diagnosis not present

## 2023-05-05 DIAGNOSIS — K5900 Constipation, unspecified: Secondary | ICD-10-CM | POA: Diagnosis not present

## 2023-05-05 DIAGNOSIS — J309 Allergic rhinitis, unspecified: Secondary | ICD-10-CM | POA: Diagnosis not present

## 2023-05-05 DIAGNOSIS — Z466 Encounter for fitting and adjustment of urinary device: Secondary | ICD-10-CM | POA: Diagnosis not present

## 2023-05-05 DIAGNOSIS — G894 Chronic pain syndrome: Secondary | ICD-10-CM | POA: Diagnosis not present

## 2023-05-05 DIAGNOSIS — D631 Anemia in chronic kidney disease: Secondary | ICD-10-CM | POA: Diagnosis not present

## 2023-05-08 DIAGNOSIS — D631 Anemia in chronic kidney disease: Secondary | ICD-10-CM | POA: Diagnosis not present

## 2023-05-08 DIAGNOSIS — G4700 Insomnia, unspecified: Secondary | ICD-10-CM | POA: Diagnosis not present

## 2023-05-08 DIAGNOSIS — Z466 Encounter for fitting and adjustment of urinary device: Secondary | ICD-10-CM | POA: Diagnosis not present

## 2023-05-08 DIAGNOSIS — E039 Hypothyroidism, unspecified: Secondary | ICD-10-CM | POA: Diagnosis not present

## 2023-05-08 DIAGNOSIS — G3184 Mild cognitive impairment, so stated: Secondary | ICD-10-CM | POA: Diagnosis not present

## 2023-05-08 DIAGNOSIS — M069 Rheumatoid arthritis, unspecified: Secondary | ICD-10-CM | POA: Diagnosis not present

## 2023-05-08 DIAGNOSIS — K5900 Constipation, unspecified: Secondary | ICD-10-CM | POA: Diagnosis not present

## 2023-05-08 DIAGNOSIS — E559 Vitamin D deficiency, unspecified: Secondary | ICD-10-CM | POA: Diagnosis not present

## 2023-05-08 DIAGNOSIS — G894 Chronic pain syndrome: Secondary | ICD-10-CM | POA: Diagnosis not present

## 2023-05-08 DIAGNOSIS — N1831 Chronic kidney disease, stage 3a: Secondary | ICD-10-CM | POA: Diagnosis not present

## 2023-05-08 DIAGNOSIS — E1122 Type 2 diabetes mellitus with diabetic chronic kidney disease: Secondary | ICD-10-CM | POA: Diagnosis not present

## 2023-05-08 DIAGNOSIS — S81802D Unspecified open wound, left lower leg, subsequent encounter: Secondary | ICD-10-CM | POA: Diagnosis not present

## 2023-05-08 DIAGNOSIS — E1151 Type 2 diabetes mellitus with diabetic peripheral angiopathy without gangrene: Secondary | ICD-10-CM | POA: Diagnosis not present

## 2023-05-08 DIAGNOSIS — E785 Hyperlipidemia, unspecified: Secondary | ICD-10-CM | POA: Diagnosis not present

## 2023-05-08 DIAGNOSIS — R1310 Dysphagia, unspecified: Secondary | ICD-10-CM | POA: Diagnosis not present

## 2023-05-08 DIAGNOSIS — J309 Allergic rhinitis, unspecified: Secondary | ICD-10-CM | POA: Diagnosis not present

## 2023-05-08 DIAGNOSIS — M4800 Spinal stenosis, site unspecified: Secondary | ICD-10-CM | POA: Diagnosis not present

## 2023-05-08 DIAGNOSIS — Z7952 Long term (current) use of systemic steroids: Secondary | ICD-10-CM | POA: Diagnosis not present

## 2023-05-08 DIAGNOSIS — I129 Hypertensive chronic kidney disease with stage 1 through stage 4 chronic kidney disease, or unspecified chronic kidney disease: Secondary | ICD-10-CM | POA: Diagnosis not present

## 2023-05-11 DIAGNOSIS — S81802D Unspecified open wound, left lower leg, subsequent encounter: Secondary | ICD-10-CM | POA: Diagnosis not present

## 2023-05-11 DIAGNOSIS — M4800 Spinal stenosis, site unspecified: Secondary | ICD-10-CM | POA: Diagnosis not present

## 2023-05-11 DIAGNOSIS — E1151 Type 2 diabetes mellitus with diabetic peripheral angiopathy without gangrene: Secondary | ICD-10-CM | POA: Diagnosis not present

## 2023-05-11 DIAGNOSIS — Z7952 Long term (current) use of systemic steroids: Secondary | ICD-10-CM | POA: Diagnosis not present

## 2023-05-11 DIAGNOSIS — N1831 Chronic kidney disease, stage 3a: Secondary | ICD-10-CM | POA: Diagnosis not present

## 2023-05-11 DIAGNOSIS — Z466 Encounter for fitting and adjustment of urinary device: Secondary | ICD-10-CM | POA: Diagnosis not present

## 2023-05-11 DIAGNOSIS — G3184 Mild cognitive impairment, so stated: Secondary | ICD-10-CM | POA: Diagnosis not present

## 2023-05-11 DIAGNOSIS — K5900 Constipation, unspecified: Secondary | ICD-10-CM | POA: Diagnosis not present

## 2023-05-11 DIAGNOSIS — R1310 Dysphagia, unspecified: Secondary | ICD-10-CM | POA: Diagnosis not present

## 2023-05-11 DIAGNOSIS — J309 Allergic rhinitis, unspecified: Secondary | ICD-10-CM | POA: Diagnosis not present

## 2023-05-11 DIAGNOSIS — G4700 Insomnia, unspecified: Secondary | ICD-10-CM | POA: Diagnosis not present

## 2023-05-11 DIAGNOSIS — G894 Chronic pain syndrome: Secondary | ICD-10-CM | POA: Diagnosis not present

## 2023-05-11 DIAGNOSIS — E785 Hyperlipidemia, unspecified: Secondary | ICD-10-CM | POA: Diagnosis not present

## 2023-05-11 DIAGNOSIS — D631 Anemia in chronic kidney disease: Secondary | ICD-10-CM | POA: Diagnosis not present

## 2023-05-11 DIAGNOSIS — E559 Vitamin D deficiency, unspecified: Secondary | ICD-10-CM | POA: Diagnosis not present

## 2023-05-11 DIAGNOSIS — E039 Hypothyroidism, unspecified: Secondary | ICD-10-CM | POA: Diagnosis not present

## 2023-05-11 DIAGNOSIS — M069 Rheumatoid arthritis, unspecified: Secondary | ICD-10-CM | POA: Diagnosis not present

## 2023-05-11 DIAGNOSIS — E1122 Type 2 diabetes mellitus with diabetic chronic kidney disease: Secondary | ICD-10-CM | POA: Diagnosis not present

## 2023-05-11 DIAGNOSIS — I129 Hypertensive chronic kidney disease with stage 1 through stage 4 chronic kidney disease, or unspecified chronic kidney disease: Secondary | ICD-10-CM | POA: Diagnosis not present

## 2023-05-12 DIAGNOSIS — D631 Anemia in chronic kidney disease: Secondary | ICD-10-CM | POA: Diagnosis not present

## 2023-05-12 DIAGNOSIS — I10 Essential (primary) hypertension: Secondary | ICD-10-CM | POA: Diagnosis not present

## 2023-05-12 DIAGNOSIS — E1151 Type 2 diabetes mellitus with diabetic peripheral angiopathy without gangrene: Secondary | ICD-10-CM | POA: Diagnosis not present

## 2023-05-12 DIAGNOSIS — N1831 Chronic kidney disease, stage 3a: Secondary | ICD-10-CM | POA: Diagnosis not present

## 2023-05-12 DIAGNOSIS — K5900 Constipation, unspecified: Secondary | ICD-10-CM | POA: Diagnosis not present

## 2023-05-12 DIAGNOSIS — J309 Allergic rhinitis, unspecified: Secondary | ICD-10-CM | POA: Diagnosis not present

## 2023-05-12 DIAGNOSIS — E785 Hyperlipidemia, unspecified: Secondary | ICD-10-CM | POA: Diagnosis not present

## 2023-05-12 DIAGNOSIS — E1122 Type 2 diabetes mellitus with diabetic chronic kidney disease: Secondary | ICD-10-CM | POA: Diagnosis not present

## 2023-05-12 DIAGNOSIS — Z7952 Long term (current) use of systemic steroids: Secondary | ICD-10-CM | POA: Diagnosis not present

## 2023-05-12 DIAGNOSIS — M4800 Spinal stenosis, site unspecified: Secondary | ICD-10-CM | POA: Diagnosis not present

## 2023-05-12 DIAGNOSIS — Z466 Encounter for fitting and adjustment of urinary device: Secondary | ICD-10-CM | POA: Diagnosis not present

## 2023-05-12 DIAGNOSIS — E039 Hypothyroidism, unspecified: Secondary | ICD-10-CM | POA: Diagnosis not present

## 2023-05-12 DIAGNOSIS — M069 Rheumatoid arthritis, unspecified: Secondary | ICD-10-CM | POA: Diagnosis not present

## 2023-05-12 DIAGNOSIS — G4700 Insomnia, unspecified: Secondary | ICD-10-CM | POA: Diagnosis not present

## 2023-05-12 DIAGNOSIS — G894 Chronic pain syndrome: Secondary | ICD-10-CM | POA: Diagnosis not present

## 2023-05-12 DIAGNOSIS — R1310 Dysphagia, unspecified: Secondary | ICD-10-CM | POA: Diagnosis not present

## 2023-05-12 DIAGNOSIS — S81802D Unspecified open wound, left lower leg, subsequent encounter: Secondary | ICD-10-CM | POA: Diagnosis not present

## 2023-05-12 DIAGNOSIS — E559 Vitamin D deficiency, unspecified: Secondary | ICD-10-CM | POA: Diagnosis not present

## 2023-05-12 DIAGNOSIS — G3184 Mild cognitive impairment, so stated: Secondary | ICD-10-CM | POA: Diagnosis not present

## 2023-05-12 DIAGNOSIS — I129 Hypertensive chronic kidney disease with stage 1 through stage 4 chronic kidney disease, or unspecified chronic kidney disease: Secondary | ICD-10-CM | POA: Diagnosis not present

## 2023-05-13 DIAGNOSIS — M069 Rheumatoid arthritis, unspecified: Secondary | ICD-10-CM | POA: Diagnosis not present

## 2023-05-13 DIAGNOSIS — M4800 Spinal stenosis, site unspecified: Secondary | ICD-10-CM | POA: Diagnosis not present

## 2023-05-13 DIAGNOSIS — G894 Chronic pain syndrome: Secondary | ICD-10-CM | POA: Diagnosis not present

## 2023-05-14 DIAGNOSIS — Z7952 Long term (current) use of systemic steroids: Secondary | ICD-10-CM | POA: Diagnosis not present

## 2023-05-14 DIAGNOSIS — E1122 Type 2 diabetes mellitus with diabetic chronic kidney disease: Secondary | ICD-10-CM | POA: Diagnosis not present

## 2023-05-14 DIAGNOSIS — N1831 Chronic kidney disease, stage 3a: Secondary | ICD-10-CM | POA: Diagnosis not present

## 2023-05-14 DIAGNOSIS — E559 Vitamin D deficiency, unspecified: Secondary | ICD-10-CM | POA: Diagnosis not present

## 2023-05-14 DIAGNOSIS — K5900 Constipation, unspecified: Secondary | ICD-10-CM | POA: Diagnosis not present

## 2023-05-14 DIAGNOSIS — D631 Anemia in chronic kidney disease: Secondary | ICD-10-CM | POA: Diagnosis not present

## 2023-05-14 DIAGNOSIS — G4700 Insomnia, unspecified: Secondary | ICD-10-CM | POA: Diagnosis not present

## 2023-05-14 DIAGNOSIS — S81802D Unspecified open wound, left lower leg, subsequent encounter: Secondary | ICD-10-CM | POA: Diagnosis not present

## 2023-05-14 DIAGNOSIS — Z466 Encounter for fitting and adjustment of urinary device: Secondary | ICD-10-CM | POA: Diagnosis not present

## 2023-05-14 DIAGNOSIS — I129 Hypertensive chronic kidney disease with stage 1 through stage 4 chronic kidney disease, or unspecified chronic kidney disease: Secondary | ICD-10-CM | POA: Diagnosis not present

## 2023-05-14 DIAGNOSIS — M069 Rheumatoid arthritis, unspecified: Secondary | ICD-10-CM | POA: Diagnosis not present

## 2023-05-14 DIAGNOSIS — G3184 Mild cognitive impairment, so stated: Secondary | ICD-10-CM | POA: Diagnosis not present

## 2023-05-14 DIAGNOSIS — G894 Chronic pain syndrome: Secondary | ICD-10-CM | POA: Diagnosis not present

## 2023-05-14 DIAGNOSIS — M4800 Spinal stenosis, site unspecified: Secondary | ICD-10-CM | POA: Diagnosis not present

## 2023-05-14 DIAGNOSIS — E039 Hypothyroidism, unspecified: Secondary | ICD-10-CM | POA: Diagnosis not present

## 2023-05-14 DIAGNOSIS — R1310 Dysphagia, unspecified: Secondary | ICD-10-CM | POA: Diagnosis not present

## 2023-05-14 DIAGNOSIS — J309 Allergic rhinitis, unspecified: Secondary | ICD-10-CM | POA: Diagnosis not present

## 2023-05-14 DIAGNOSIS — E785 Hyperlipidemia, unspecified: Secondary | ICD-10-CM | POA: Diagnosis not present

## 2023-05-14 DIAGNOSIS — E1151 Type 2 diabetes mellitus with diabetic peripheral angiopathy without gangrene: Secondary | ICD-10-CM | POA: Diagnosis not present

## 2023-05-18 DIAGNOSIS — Z7952 Long term (current) use of systemic steroids: Secondary | ICD-10-CM | POA: Diagnosis not present

## 2023-05-18 DIAGNOSIS — R519 Headache, unspecified: Secondary | ICD-10-CM | POA: Insufficient documentation

## 2023-05-18 DIAGNOSIS — R0902 Hypoxemia: Secondary | ICD-10-CM | POA: Diagnosis not present

## 2023-05-18 DIAGNOSIS — R1310 Dysphagia, unspecified: Secondary | ICD-10-CM | POA: Diagnosis not present

## 2023-05-18 DIAGNOSIS — E559 Vitamin D deficiency, unspecified: Secondary | ICD-10-CM | POA: Diagnosis not present

## 2023-05-18 DIAGNOSIS — E1122 Type 2 diabetes mellitus with diabetic chronic kidney disease: Secondary | ICD-10-CM | POA: Diagnosis not present

## 2023-05-18 DIAGNOSIS — J309 Allergic rhinitis, unspecified: Secondary | ICD-10-CM | POA: Diagnosis not present

## 2023-05-18 DIAGNOSIS — N189 Chronic kidney disease, unspecified: Secondary | ICD-10-CM | POA: Insufficient documentation

## 2023-05-18 DIAGNOSIS — W050XXA Fall from non-moving wheelchair, initial encounter: Secondary | ICD-10-CM | POA: Insufficient documentation

## 2023-05-18 DIAGNOSIS — Z79899 Other long term (current) drug therapy: Secondary | ICD-10-CM | POA: Insufficient documentation

## 2023-05-18 DIAGNOSIS — Z7982 Long term (current) use of aspirin: Secondary | ICD-10-CM | POA: Insufficient documentation

## 2023-05-18 DIAGNOSIS — G4700 Insomnia, unspecified: Secondary | ICD-10-CM | POA: Diagnosis not present

## 2023-05-18 DIAGNOSIS — R6889 Other general symptoms and signs: Secondary | ICD-10-CM | POA: Diagnosis not present

## 2023-05-18 DIAGNOSIS — S0990XA Unspecified injury of head, initial encounter: Secondary | ICD-10-CM | POA: Diagnosis not present

## 2023-05-18 DIAGNOSIS — G894 Chronic pain syndrome: Secondary | ICD-10-CM | POA: Diagnosis not present

## 2023-05-18 DIAGNOSIS — Z743 Need for continuous supervision: Secondary | ICD-10-CM | POA: Diagnosis not present

## 2023-05-18 DIAGNOSIS — I129 Hypertensive chronic kidney disease with stage 1 through stage 4 chronic kidney disease, or unspecified chronic kidney disease: Secondary | ICD-10-CM | POA: Diagnosis not present

## 2023-05-18 DIAGNOSIS — I6782 Cerebral ischemia: Secondary | ICD-10-CM | POA: Diagnosis not present

## 2023-05-18 DIAGNOSIS — N1831 Chronic kidney disease, stage 3a: Secondary | ICD-10-CM | POA: Diagnosis not present

## 2023-05-18 DIAGNOSIS — E1151 Type 2 diabetes mellitus with diabetic peripheral angiopathy without gangrene: Secondary | ICD-10-CM | POA: Diagnosis not present

## 2023-05-18 DIAGNOSIS — D631 Anemia in chronic kidney disease: Secondary | ICD-10-CM | POA: Diagnosis not present

## 2023-05-18 DIAGNOSIS — E039 Hypothyroidism, unspecified: Secondary | ICD-10-CM | POA: Diagnosis not present

## 2023-05-18 DIAGNOSIS — K5900 Constipation, unspecified: Secondary | ICD-10-CM | POA: Diagnosis not present

## 2023-05-18 DIAGNOSIS — M25552 Pain in left hip: Secondary | ICD-10-CM | POA: Diagnosis not present

## 2023-05-18 DIAGNOSIS — S81802D Unspecified open wound, left lower leg, subsequent encounter: Secondary | ICD-10-CM | POA: Diagnosis not present

## 2023-05-18 DIAGNOSIS — M4800 Spinal stenosis, site unspecified: Secondary | ICD-10-CM | POA: Diagnosis not present

## 2023-05-18 DIAGNOSIS — G3184 Mild cognitive impairment, so stated: Secondary | ICD-10-CM | POA: Diagnosis not present

## 2023-05-18 DIAGNOSIS — W19XXXA Unspecified fall, initial encounter: Secondary | ICD-10-CM | POA: Diagnosis not present

## 2023-05-18 DIAGNOSIS — E785 Hyperlipidemia, unspecified: Secondary | ICD-10-CM | POA: Diagnosis not present

## 2023-05-18 DIAGNOSIS — Z981 Arthrodesis status: Secondary | ICD-10-CM | POA: Diagnosis not present

## 2023-05-18 DIAGNOSIS — M069 Rheumatoid arthritis, unspecified: Secondary | ICD-10-CM | POA: Diagnosis not present

## 2023-05-18 DIAGNOSIS — Z466 Encounter for fitting and adjustment of urinary device: Secondary | ICD-10-CM | POA: Diagnosis not present

## 2023-05-19 ENCOUNTER — Emergency Department (HOSPITAL_COMMUNITY): Payer: Medicare Other

## 2023-05-19 ENCOUNTER — Emergency Department (HOSPITAL_COMMUNITY)
Admission: EM | Admit: 2023-05-19 | Discharge: 2023-05-19 | Disposition: A | Discharge status: Skilled Nursing Facility | Payer: Medicare Other | Attending: Emergency Medicine | Admitting: Emergency Medicine

## 2023-05-19 DIAGNOSIS — S81802D Unspecified open wound, left lower leg, subsequent encounter: Secondary | ICD-10-CM | POA: Diagnosis not present

## 2023-05-19 DIAGNOSIS — S0990XA Unspecified injury of head, initial encounter: Secondary | ICD-10-CM | POA: Diagnosis not present

## 2023-05-19 DIAGNOSIS — D631 Anemia in chronic kidney disease: Secondary | ICD-10-CM | POA: Diagnosis not present

## 2023-05-19 DIAGNOSIS — Z7952 Long term (current) use of systemic steroids: Secondary | ICD-10-CM | POA: Diagnosis not present

## 2023-05-19 DIAGNOSIS — Z981 Arthrodesis status: Secondary | ICD-10-CM | POA: Diagnosis not present

## 2023-05-19 DIAGNOSIS — E559 Vitamin D deficiency, unspecified: Secondary | ICD-10-CM | POA: Diagnosis not present

## 2023-05-19 DIAGNOSIS — M25552 Pain in left hip: Secondary | ICD-10-CM | POA: Diagnosis not present

## 2023-05-19 DIAGNOSIS — G894 Chronic pain syndrome: Secondary | ICD-10-CM | POA: Diagnosis not present

## 2023-05-19 DIAGNOSIS — E1122 Type 2 diabetes mellitus with diabetic chronic kidney disease: Secondary | ICD-10-CM | POA: Diagnosis not present

## 2023-05-19 DIAGNOSIS — E1151 Type 2 diabetes mellitus with diabetic peripheral angiopathy without gangrene: Secondary | ICD-10-CM | POA: Diagnosis not present

## 2023-05-19 DIAGNOSIS — E039 Hypothyroidism, unspecified: Secondary | ICD-10-CM | POA: Diagnosis not present

## 2023-05-19 DIAGNOSIS — Z466 Encounter for fitting and adjustment of urinary device: Secondary | ICD-10-CM | POA: Diagnosis not present

## 2023-05-19 DIAGNOSIS — N1831 Chronic kidney disease, stage 3a: Secondary | ICD-10-CM | POA: Diagnosis not present

## 2023-05-19 DIAGNOSIS — K5900 Constipation, unspecified: Secondary | ICD-10-CM | POA: Diagnosis not present

## 2023-05-19 DIAGNOSIS — I129 Hypertensive chronic kidney disease with stage 1 through stage 4 chronic kidney disease, or unspecified chronic kidney disease: Secondary | ICD-10-CM | POA: Diagnosis not present

## 2023-05-19 DIAGNOSIS — R531 Weakness: Secondary | ICD-10-CM | POA: Diagnosis not present

## 2023-05-19 DIAGNOSIS — I6782 Cerebral ischemia: Secondary | ICD-10-CM | POA: Diagnosis not present

## 2023-05-19 DIAGNOSIS — Z7401 Bed confinement status: Secondary | ICD-10-CM | POA: Diagnosis not present

## 2023-05-19 DIAGNOSIS — J309 Allergic rhinitis, unspecified: Secondary | ICD-10-CM | POA: Diagnosis not present

## 2023-05-19 DIAGNOSIS — G3184 Mild cognitive impairment, so stated: Secondary | ICD-10-CM | POA: Diagnosis not present

## 2023-05-19 DIAGNOSIS — W19XXXA Unspecified fall, initial encounter: Secondary | ICD-10-CM

## 2023-05-19 DIAGNOSIS — M069 Rheumatoid arthritis, unspecified: Secondary | ICD-10-CM | POA: Diagnosis not present

## 2023-05-19 DIAGNOSIS — G4700 Insomnia, unspecified: Secondary | ICD-10-CM | POA: Diagnosis not present

## 2023-05-19 DIAGNOSIS — R1310 Dysphagia, unspecified: Secondary | ICD-10-CM | POA: Diagnosis not present

## 2023-05-19 DIAGNOSIS — M4800 Spinal stenosis, site unspecified: Secondary | ICD-10-CM | POA: Diagnosis not present

## 2023-05-19 DIAGNOSIS — E785 Hyperlipidemia, unspecified: Secondary | ICD-10-CM | POA: Diagnosis not present

## 2023-05-19 MED ORDER — NYSTATIN 100000 UNIT/GM EX POWD
Freq: Once | CUTANEOUS | Status: AC
  Filled 2023-05-19: qty 15

## 2023-05-19 NOTE — ED Provider Notes (Signed)
[facility] EMERGENCY DEPARTMENT AT [facility] Provider Note   CSN: [id number] Arrival date & time: 05/18/23  2358     History  Chief Complaint  Patient presents with   [name]    [name] is a 77 y.o. female.  The history is provided by the patient and the EMS personnel.  Fall  [name] is a 77 y.o. female who presents to the Emergency Department complaining of fall.  She presents to the emergency department by EMS for evaluation of injuries following a fall.  [facility] is staying at [facility] and is typically in a wheelchair but does stand to transfer.  She states that she was leaning and fell out of her wheelchair and struck the back of her head.  She does have some mild discomfort to the back of her head.  She also reports several days of left hip pain, but does not think she hit it. On review of systems she states that her left foot has been red, it was recently wrapped. No fevers, chest pain, difficulty breathing, nausea, vomiting, abdominal pain.  She has a history of rheumatoid arthritis on chronic steroids, CKD.  She is not on any anticoagulants.  She is not diabetic.  Home Medications Prior to Admission medications   Medication Sig Start Date End Date Taking? Authorizing Provider  ACCU-CHEK GUIDE test strip Check blood sugar every morning 10/29/17   [provider]  acetaminophen (TYLENOL) 500 MG tablet Take 1,000 mg by mouth every 6 (six) hours as needed for fever (or pain).    [provider]  aspirin 81 MG chewable tablet Chew 81 mg by mouth daily. 05/24/21   [provider]  Benzocaine-Menthol (SORE THROAT) 15-3.6 MG LOZG Use as directed 1 lozenge in the mouth or throat every 2 (two) hours as needed (sore throat).    [provider]  benztropine (COGENTIN) 0.5 MG tablet Take 0.5 mg by mouth in the morning and at bedtime.    [provider]  Cholecalciferol (VITAMIN D3) 50 MCG (2000 UT)  TABS Take 2,000 Units by mouth in the morning.    [provider]  Dextromethorphan-guaiFENesin 5-100 MG/5ML LIQD Take 20 mLs by mouth every 4 (four) hours as needed (cough/congestion). 09/10/22   [provider]  ENULOSE 10 GM/15ML SOLN Take 10 g by mouth daily. 07/25/22   [provider]  escitalopram (LEXAPRO) 10 MG tablet Take 15 mg by mouth at bedtime.    [provider]  ferrous sulfate 325 (65 FE) MG EC tablet Take 325 mg by mouth every Monday, Wednesday, and Friday.    [provider]  furosemide (LASIX) 40 MG tablet Take 0.5 tablets (20 mg total) by mouth daily. Patient not taking: Reported on 11/10/2022 11/03/22   Alwyn Ren, MD  HYDROcodone-acetaminophen (NORCO/VICODIN) 5-325 MG tablet Take 1 tablet by mouth every 4 (four) hours as needed (for pain).    [provider]  hydroxychloroquine (PLAQUENIL) 200 MG tablet Take 600 mg by mouth daily.    [provider]  hydrOXYzine (VISTARIL) 50 MG capsule Take 50 mg by mouth in the morning and at bedtime. 09/17/22   [provider]  ipratropium (ATROVENT) 0.03 % nasal spray Place 2 sprays into both nostrils 2 (two) times daily as needed for rhinitis. 05/16/22   [provider]  LASIX 20 MG tablet Take 20 mg by mouth in the morning.    [provider]  leflunomide Ranae Plumber)  20 MG tablet Take 20 mg by mouth daily.    [provider]  loperamide (IMODIUM A-D) 2 MG tablet Take 2 mg by mouth See admin instructions. Take 2 mg by mouth as needed/as directed after each loose stool- MAX OF 3 TABLETS/24 HOURS    [provider]  loratadine (CLARITIN) 10 MG tablet Take 1 tablet (10 mg total) by mouth daily. Patient taking differently: Take 10 mg by mouth in the morning. 08/22/22   Achille Rich, PA-C  melatonin 5 MG TABS Take 5 mg by mouth at bedtime.    [provider]  metoprolol succinate (TOPROL-XL) 25 MG 24 hr tablet Take 25 mg by mouth  daily.    [provider]  nystatin cream (MYCOSTATIN) Apply 1 Application topically 2 (two) times daily as needed (for redness- under abdominal folds and/or under the breasts). 07/24/22   [provider]  OLANZapine (ZYPREXA) 5 MG tablet Take 5 mg by mouth daily.    [provider]  polyethylene glycol (MIRALAX / GLYCOLAX) 17 g packet Take 17 g by mouth 2 (two) times daily. Patient taking differently: Take 17 g by mouth in the morning and at bedtime. 12/31/20   Regalado, Belkys A, MD  predniSONE (DELTASONE) 5 MG tablet Take 5 mg by mouth daily with breakfast.    [provider]  trimethoprim-polymyxin b (POLYTRIM) ophthalmic solution Place 1 drop into both eyes every 4 (four) hours. While awake Patient not taking: Reported on 10/01/2022 08/22/22   Achille Rich, PA-C  vitamin C (ASCORBIC ACID) 250 MG tablet Take 250 mg by mouth every Monday, Wednesday, and Friday.    [provider]      Allergies    Iodinated contrast media, Abilify [aripiprazole], Ultram [tramadol hcl], and Zithromax [azithromycin]    Review of Systems   Review of Systems  All other systems reviewed and are negative.   Physical Exam Updated Vital Signs BP 134/70   Pulse 76   Temp 98.7 F (37.1 C) (Oral)   Resp 18   Ht 5\' 3"  (1.6 m)   Wt 70.3 kg   SpO2 97%   BMI 27.46 kg/m  Physical Exam Vitals and nursing note reviewed.  Constitutional:      Appearance: She is well-developed.  HENT:     Head: Normocephalic and atraumatic.     Comments: No appreciable soft tissue changes to the head but there is some tenderness to palpation over the occipital scalp.  There is an area of red and dry skin to the forehead. Neck:     Comments: No midline cervical spine tenderness Cardiovascular:     Rate and Rhythm: Normal rate and regular rhythm.     Heart sounds: No murmur heard. Pulmonary:     Effort: Pulmonary effort is normal. No respiratory distress.     Breath sounds: Normal  breath sounds.  Abdominal:     Palpations: Abdomen is soft.     Tenderness: There is no abdominal tenderness. There is no guarding or rebound.  Musculoskeletal:     Comments: 2+ DP pulses bilaterally.  There is mild tenderness over the left hip but she is able to range the hip.  There is edema to bilateral lower extremities.  There are chronic venous stasis changes to bilateral lower extremities.  There is a well-demarcated line to the distal midfoot with erythema distal to this.  Patient states there was a dressing at this area, that has recently been removed.  Skin:  General: Skin is warm and dry.  Neurological:     Mental Status: She is alert and oriented to person, place, and time.  Psychiatric:        Behavior: Behavior normal.     ED Results / Procedures / Treatments   Labs (all labs ordered are listed, but only abnormal results are displayed) Labs Reviewed - No data to display  EKG None  Radiology DG Hip Unilat W or Wo Pelvis 2-3 Views Left  Result Date: 05/19/2023 CLINICAL DATA:  Fall, left hip pain EXAM: DG HIP (WITH OR WITHOUT PELVIS) 2-3V LEFT COMPARISON:  None Available. FINDINGS: No acute bony abnormality. Specifically, no fracture, subluxation, or dislocation. Hip joints and SI joints symmetric. Postoperative changes in the lower lumbar spine. IMPRESSION: No acute bony abnormality. Electronically Signed   By: Charlett Nose M.D.   On: 05/19/2023 00:51   CT Head Wo Contrast  Result Date: 05/19/2023 CLINICAL DATA:  Fall, head injury EXAM: CT HEAD WITHOUT CONTRAST CT CERVICAL SPINE WITHOUT CONTRAST TECHNIQUE: Multidetector CT imaging of the head and cervical spine was performed following the standard protocol without intravenous contrast. Multiplanar CT image reconstructions of the cervical spine were also generated. RADIATION DOSE REDUCTION: This exam was performed according to the departmental dose-optimization program which includes automated exposure control, adjustment  of the mA and/or kV according to patient size and/or use of iterative reconstruction technique. COMPARISON:  01/14/2023 FINDINGS: CT HEAD FINDINGS Brain: No evidence of acute infarction, hemorrhage, hydrocephalus, extra-axial collection or mass lesion/mass effect. Subcortical white matter and periventricular small vessel ischemic changes. Vascular: No hyperdense vessel or unexpected calcification. Skull: Normal. Negative for fracture or focal lesion. Sinuses/Orbits: The visualized paranasal sinuses are essentially clear. The mastoid air cells are unopacified. Other: None. CT CERVICAL SPINE FINDINGS Alignment: Normal cervical lordosis. Skull base and vertebrae: No acute fracture. No primary bone lesion or focal pathologic process. Soft tissues and spinal canal: No prevertebral fluid or swelling. No visible canal hematoma. Disc levels: C3-7 ACDF, without evidence of complication. Spinal canal is patent. Upper chest: Visualized lung apices are clear. 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 IMPRESSION: No acute intracranial abnormality. Small vessel ischemic changes. No traumatic injury to the cervical spine. C3-7 ACDF, without evidence of complication. Electronically Signed   By: Charline Bills M.D.   On: 05/19/2023 00:33   CT Cervical Spine Wo Contrast  Result Date: 05/19/2023 CLINICAL DATA:  Fall, head injury EXAM: CT HEAD WITHOUT CONTRAST CT CERVICAL SPINE WITHOUT CONTRAST TECHNIQUE: Multidetector CT imaging of the head and cervical spine was performed following the standard protocol without intravenous contrast. Multiplanar CT image reconstructions of the cervical spine were also generated. RADIATION DOSE REDUCTION: This exam was performed according to the departmental dose-optimization program which includes automated exposure control, adjustment of the mA and/or kV according to patient size and/or use of iterative reconstruction technique. COMPARISON:  01/14/2023 FINDINGS: CT  HEAD FINDINGS Brain: No evidence of acute infarction, hemorrhage, hydrocephalus, extra-axial collection or mass lesion/mass effect. Subcortical white matter and periventricular small vessel ischemic changes. Vascular: No hyperdense vessel or unexpected calcification. Skull: Normal. Negative for fracture or focal lesion. Sinuses/Orbits: The visualized paranasal sinuses are essentially clear. The mastoid air cells are unopacified. Other: None. CT CERVICAL SPINE FINDINGS Alignment: Normal cervical lordosis. Skull base and vertebrae: No acute fracture. No primary bone lesion or focal pathologic process. Soft tissues and spinal canal: No prevertebral fluid or swelling. No visible canal hematoma. Disc levels: C3-7 ACDF, without evidence of complication.  Spinal canal is patent. Upper chest: Visualized lung apices are clear. 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 IMPRESSION: No acute intracranial abnormality. Small vessel ischemic changes. No traumatic injury to the cervical spine. C3-7 ACDF, without evidence of complication. Electronically Signed   By: Charline Bills M.D.   On: 05/19/2023 00:33    Procedures Procedures    Medications Ordered in ED Medications  nystatin (MYCOSTATIN/NYSTOP) topical powder ( Topical Given 05/19/23 0252)    ED Course/ Medical Decision Making/ A&P                                 Medical Decision Making Amount and/or Complexity of Data Reviewed Radiology: ordered.  Risk Prescription drug management.   Patient here for evaluation of injuries following a fall out of her wheelchair.  No syncopal episode.  She does have some mild tenderness to her scalp, images are negative for serious closed head injury, cervical spine injury.  She also does have some tenderness on her left hip, although she states this has been present for several days.  Images are negative for acute fracture.  She is able to stand without pain in the emergency department.   On review of systems she does note some redness to her foot, this appears to be secondary to a recent dressing with a discrete line present where her prior dressing was placed.  Exam is not consistent with DVT, cellulitis.  Plan to discharge back to facility, discussed repeat evaluation by her doctor.  Offered to contact family and patient declines.        Final Clinical Impression(s) / ED Diagnoses Final diagnoses:  Fall, initial encounter    Rx / DC Orders ED Discharge Orders     None         Tilden Fossa, MD 05/19/23 [phone]

## 2023-05-19 NOTE — ED Notes (Signed)
Contacted [facility] for transport 

## 2023-05-19 NOTE — ED Triage Notes (Signed)
Pt from [facility] with c/o fall, while trying to get out of wheelchair to bed. Pt reports hitting head, - thinners, - LOC, EMS found pt in bed on arrival.  Pt c/o swelling to left foot, unrelated but requesting it be assessed.   EMS VS  140/90, 86HR, 100RA, [id number]

## 2023-05-19 NOTE — ED Notes (Addendum)
Contacted [facility] and gave report

## 2023-05-20 DIAGNOSIS — W19XXXA Unspecified fall, initial encounter: Secondary | ICD-10-CM | POA: Diagnosis not present

## 2023-05-20 DIAGNOSIS — G3184 Mild cognitive impairment, so stated: Secondary | ICD-10-CM | POA: Diagnosis not present

## 2023-05-20 DIAGNOSIS — R2681 Unsteadiness on feet: Secondary | ICD-10-CM | POA: Diagnosis not present

## 2023-05-22 DIAGNOSIS — I129 Hypertensive chronic kidney disease with stage 1 through stage 4 chronic kidney disease, or unspecified chronic kidney disease: Secondary | ICD-10-CM | POA: Diagnosis not present

## 2023-05-22 DIAGNOSIS — E039 Hypothyroidism, unspecified: Secondary | ICD-10-CM | POA: Diagnosis not present

## 2023-05-22 DIAGNOSIS — E1151 Type 2 diabetes mellitus with diabetic peripheral angiopathy without gangrene: Secondary | ICD-10-CM | POA: Diagnosis not present

## 2023-05-22 DIAGNOSIS — J309 Allergic rhinitis, unspecified: Secondary | ICD-10-CM | POA: Diagnosis not present

## 2023-05-22 DIAGNOSIS — E559 Vitamin D deficiency, unspecified: Secondary | ICD-10-CM | POA: Diagnosis not present

## 2023-05-22 DIAGNOSIS — Z466 Encounter for fitting and adjustment of urinary device: Secondary | ICD-10-CM | POA: Diagnosis not present

## 2023-05-22 DIAGNOSIS — G4700 Insomnia, unspecified: Secondary | ICD-10-CM | POA: Diagnosis not present

## 2023-05-22 DIAGNOSIS — Z7952 Long term (current) use of systemic steroids: Secondary | ICD-10-CM | POA: Diagnosis not present

## 2023-05-22 DIAGNOSIS — E785 Hyperlipidemia, unspecified: Secondary | ICD-10-CM | POA: Diagnosis not present

## 2023-05-22 DIAGNOSIS — R1310 Dysphagia, unspecified: Secondary | ICD-10-CM | POA: Diagnosis not present

## 2023-05-22 DIAGNOSIS — N1831 Chronic kidney disease, stage 3a: Secondary | ICD-10-CM | POA: Diagnosis not present

## 2023-05-22 DIAGNOSIS — G894 Chronic pain syndrome: Secondary | ICD-10-CM | POA: Diagnosis not present

## 2023-05-22 DIAGNOSIS — E1122 Type 2 diabetes mellitus with diabetic chronic kidney disease: Secondary | ICD-10-CM | POA: Diagnosis not present

## 2023-05-22 DIAGNOSIS — S81802D Unspecified open wound, left lower leg, subsequent encounter: Secondary | ICD-10-CM | POA: Diagnosis not present

## 2023-05-22 DIAGNOSIS — M4800 Spinal stenosis, site unspecified: Secondary | ICD-10-CM | POA: Diagnosis not present

## 2023-05-22 DIAGNOSIS — D631 Anemia in chronic kidney disease: Secondary | ICD-10-CM | POA: Diagnosis not present

## 2023-05-22 DIAGNOSIS — K5900 Constipation, unspecified: Secondary | ICD-10-CM | POA: Diagnosis not present

## 2023-05-22 DIAGNOSIS — G3184 Mild cognitive impairment, so stated: Secondary | ICD-10-CM | POA: Diagnosis not present

## 2023-05-22 DIAGNOSIS — M069 Rheumatoid arthritis, unspecified: Secondary | ICD-10-CM | POA: Diagnosis not present

## 2023-05-25 DIAGNOSIS — R1310 Dysphagia, unspecified: Secondary | ICD-10-CM | POA: Diagnosis not present

## 2023-05-25 DIAGNOSIS — G894 Chronic pain syndrome: Secondary | ICD-10-CM | POA: Diagnosis not present

## 2023-05-25 DIAGNOSIS — Z7952 Long term (current) use of systemic steroids: Secondary | ICD-10-CM | POA: Diagnosis not present

## 2023-05-25 DIAGNOSIS — G3184 Mild cognitive impairment, so stated: Secondary | ICD-10-CM | POA: Diagnosis not present

## 2023-05-25 DIAGNOSIS — E1151 Type 2 diabetes mellitus with diabetic peripheral angiopathy without gangrene: Secondary | ICD-10-CM | POA: Diagnosis not present

## 2023-05-25 DIAGNOSIS — E039 Hypothyroidism, unspecified: Secondary | ICD-10-CM | POA: Diagnosis not present

## 2023-05-25 DIAGNOSIS — K5900 Constipation, unspecified: Secondary | ICD-10-CM | POA: Diagnosis not present

## 2023-05-25 DIAGNOSIS — N1831 Chronic kidney disease, stage 3a: Secondary | ICD-10-CM | POA: Diagnosis not present

## 2023-05-25 DIAGNOSIS — E785 Hyperlipidemia, unspecified: Secondary | ICD-10-CM | POA: Diagnosis not present

## 2023-05-25 DIAGNOSIS — J309 Allergic rhinitis, unspecified: Secondary | ICD-10-CM | POA: Diagnosis not present

## 2023-05-25 DIAGNOSIS — E559 Vitamin D deficiency, unspecified: Secondary | ICD-10-CM | POA: Diagnosis not present

## 2023-05-25 DIAGNOSIS — D631 Anemia in chronic kidney disease: Secondary | ICD-10-CM | POA: Diagnosis not present

## 2023-05-25 DIAGNOSIS — G4700 Insomnia, unspecified: Secondary | ICD-10-CM | POA: Diagnosis not present

## 2023-05-25 DIAGNOSIS — I129 Hypertensive chronic kidney disease with stage 1 through stage 4 chronic kidney disease, or unspecified chronic kidney disease: Secondary | ICD-10-CM | POA: Diagnosis not present

## 2023-05-25 DIAGNOSIS — M4800 Spinal stenosis, site unspecified: Secondary | ICD-10-CM | POA: Diagnosis not present

## 2023-05-25 DIAGNOSIS — S81802D Unspecified open wound, left lower leg, subsequent encounter: Secondary | ICD-10-CM | POA: Diagnosis not present

## 2023-05-25 DIAGNOSIS — M069 Rheumatoid arthritis, unspecified: Secondary | ICD-10-CM | POA: Diagnosis not present

## 2023-05-25 DIAGNOSIS — Z466 Encounter for fitting and adjustment of urinary device: Secondary | ICD-10-CM | POA: Diagnosis not present

## 2023-05-25 DIAGNOSIS — E1122 Type 2 diabetes mellitus with diabetic chronic kidney disease: Secondary | ICD-10-CM | POA: Diagnosis not present

## 2023-05-27 DIAGNOSIS — R1310 Dysphagia, unspecified: Secondary | ICD-10-CM | POA: Diagnosis not present

## 2023-05-27 DIAGNOSIS — S81802D Unspecified open wound, left lower leg, subsequent encounter: Secondary | ICD-10-CM | POA: Diagnosis not present

## 2023-05-28 DIAGNOSIS — M4800 Spinal stenosis, site unspecified: Secondary | ICD-10-CM | POA: Diagnosis not present

## 2023-05-28 DIAGNOSIS — E039 Hypothyroidism, unspecified: Secondary | ICD-10-CM | POA: Diagnosis not present

## 2023-05-28 DIAGNOSIS — Z7952 Long term (current) use of systemic steroids: Secondary | ICD-10-CM | POA: Diagnosis not present

## 2023-05-28 DIAGNOSIS — Z466 Encounter for fitting and adjustment of urinary device: Secondary | ICD-10-CM | POA: Diagnosis not present

## 2023-05-28 DIAGNOSIS — E1151 Type 2 diabetes mellitus with diabetic peripheral angiopathy without gangrene: Secondary | ICD-10-CM | POA: Diagnosis not present

## 2023-05-28 DIAGNOSIS — G894 Chronic pain syndrome: Secondary | ICD-10-CM | POA: Diagnosis not present

## 2023-05-28 DIAGNOSIS — K5900 Constipation, unspecified: Secondary | ICD-10-CM | POA: Diagnosis not present

## 2023-05-28 DIAGNOSIS — J309 Allergic rhinitis, unspecified: Secondary | ICD-10-CM | POA: Diagnosis not present

## 2023-05-28 DIAGNOSIS — E785 Hyperlipidemia, unspecified: Secondary | ICD-10-CM | POA: Diagnosis not present

## 2023-05-28 DIAGNOSIS — G4700 Insomnia, unspecified: Secondary | ICD-10-CM | POA: Diagnosis not present

## 2023-05-28 DIAGNOSIS — D631 Anemia in chronic kidney disease: Secondary | ICD-10-CM | POA: Diagnosis not present

## 2023-05-28 DIAGNOSIS — M069 Rheumatoid arthritis, unspecified: Secondary | ICD-10-CM | POA: Diagnosis not present

## 2023-05-28 DIAGNOSIS — E559 Vitamin D deficiency, unspecified: Secondary | ICD-10-CM | POA: Diagnosis not present

## 2023-05-28 DIAGNOSIS — S81802D Unspecified open wound, left lower leg, subsequent encounter: Secondary | ICD-10-CM | POA: Diagnosis not present

## 2023-05-28 DIAGNOSIS — I129 Hypertensive chronic kidney disease with stage 1 through stage 4 chronic kidney disease, or unspecified chronic kidney disease: Secondary | ICD-10-CM | POA: Diagnosis not present

## 2023-05-28 DIAGNOSIS — N1831 Chronic kidney disease, stage 3a: Secondary | ICD-10-CM | POA: Diagnosis not present

## 2023-05-28 DIAGNOSIS — G3184 Mild cognitive impairment, so stated: Secondary | ICD-10-CM | POA: Diagnosis not present

## 2023-05-28 DIAGNOSIS — E1122 Type 2 diabetes mellitus with diabetic chronic kidney disease: Secondary | ICD-10-CM | POA: Diagnosis not present

## 2023-05-28 DIAGNOSIS — R1310 Dysphagia, unspecified: Secondary | ICD-10-CM | POA: Diagnosis not present

## 2023-06-02 DIAGNOSIS — E1151 Type 2 diabetes mellitus with diabetic peripheral angiopathy without gangrene: Secondary | ICD-10-CM | POA: Diagnosis not present

## 2023-06-02 DIAGNOSIS — N1831 Chronic kidney disease, stage 3a: Secondary | ICD-10-CM | POA: Diagnosis not present

## 2023-06-02 DIAGNOSIS — E785 Hyperlipidemia, unspecified: Secondary | ICD-10-CM | POA: Diagnosis not present

## 2023-06-02 DIAGNOSIS — Z466 Encounter for fitting and adjustment of urinary device: Secondary | ICD-10-CM | POA: Diagnosis not present

## 2023-06-02 DIAGNOSIS — G4700 Insomnia, unspecified: Secondary | ICD-10-CM | POA: Diagnosis not present

## 2023-06-02 DIAGNOSIS — G894 Chronic pain syndrome: Secondary | ICD-10-CM | POA: Diagnosis not present

## 2023-06-02 DIAGNOSIS — D631 Anemia in chronic kidney disease: Secondary | ICD-10-CM | POA: Diagnosis not present

## 2023-06-02 DIAGNOSIS — K5900 Constipation, unspecified: Secondary | ICD-10-CM | POA: Diagnosis not present

## 2023-06-02 DIAGNOSIS — M4800 Spinal stenosis, site unspecified: Secondary | ICD-10-CM | POA: Diagnosis not present

## 2023-06-02 DIAGNOSIS — M069 Rheumatoid arthritis, unspecified: Secondary | ICD-10-CM | POA: Diagnosis not present

## 2023-06-02 DIAGNOSIS — S81802D Unspecified open wound, left lower leg, subsequent encounter: Secondary | ICD-10-CM | POA: Diagnosis not present

## 2023-06-02 DIAGNOSIS — I129 Hypertensive chronic kidney disease with stage 1 through stage 4 chronic kidney disease, or unspecified chronic kidney disease: Secondary | ICD-10-CM | POA: Diagnosis not present

## 2023-06-02 DIAGNOSIS — J309 Allergic rhinitis, unspecified: Secondary | ICD-10-CM | POA: Diagnosis not present

## 2023-06-02 DIAGNOSIS — G3184 Mild cognitive impairment, so stated: Secondary | ICD-10-CM | POA: Diagnosis not present

## 2023-06-02 DIAGNOSIS — E559 Vitamin D deficiency, unspecified: Secondary | ICD-10-CM | POA: Diagnosis not present

## 2023-06-02 DIAGNOSIS — R1310 Dysphagia, unspecified: Secondary | ICD-10-CM | POA: Diagnosis not present

## 2023-06-02 DIAGNOSIS — E039 Hypothyroidism, unspecified: Secondary | ICD-10-CM | POA: Diagnosis not present

## 2023-06-02 DIAGNOSIS — E1122 Type 2 diabetes mellitus with diabetic chronic kidney disease: Secondary | ICD-10-CM | POA: Diagnosis not present

## 2023-06-02 DIAGNOSIS — Z7952 Long term (current) use of systemic steroids: Secondary | ICD-10-CM | POA: Diagnosis not present

## 2023-06-05 DIAGNOSIS — K5900 Constipation, unspecified: Secondary | ICD-10-CM | POA: Diagnosis not present

## 2023-06-05 DIAGNOSIS — Z7952 Long term (current) use of systemic steroids: Secondary | ICD-10-CM | POA: Diagnosis not present

## 2023-06-05 DIAGNOSIS — D631 Anemia in chronic kidney disease: Secondary | ICD-10-CM | POA: Diagnosis not present

## 2023-06-05 DIAGNOSIS — G4700 Insomnia, unspecified: Secondary | ICD-10-CM | POA: Diagnosis not present

## 2023-06-05 DIAGNOSIS — Z466 Encounter for fitting and adjustment of urinary device: Secondary | ICD-10-CM | POA: Diagnosis not present

## 2023-06-05 DIAGNOSIS — G894 Chronic pain syndrome: Secondary | ICD-10-CM | POA: Diagnosis not present

## 2023-06-05 DIAGNOSIS — M4800 Spinal stenosis, site unspecified: Secondary | ICD-10-CM | POA: Diagnosis not present

## 2023-06-05 DIAGNOSIS — I129 Hypertensive chronic kidney disease with stage 1 through stage 4 chronic kidney disease, or unspecified chronic kidney disease: Secondary | ICD-10-CM | POA: Diagnosis not present

## 2023-06-05 DIAGNOSIS — S81802D Unspecified open wound, left lower leg, subsequent encounter: Secondary | ICD-10-CM | POA: Diagnosis not present

## 2023-06-05 DIAGNOSIS — M069 Rheumatoid arthritis, unspecified: Secondary | ICD-10-CM | POA: Diagnosis not present

## 2023-06-05 DIAGNOSIS — R1310 Dysphagia, unspecified: Secondary | ICD-10-CM | POA: Diagnosis not present

## 2023-06-05 DIAGNOSIS — N1831 Chronic kidney disease, stage 3a: Secondary | ICD-10-CM | POA: Diagnosis not present

## 2023-06-05 DIAGNOSIS — E1151 Type 2 diabetes mellitus with diabetic peripheral angiopathy without gangrene: Secondary | ICD-10-CM | POA: Diagnosis not present

## 2023-06-05 DIAGNOSIS — E039 Hypothyroidism, unspecified: Secondary | ICD-10-CM | POA: Diagnosis not present

## 2023-06-05 DIAGNOSIS — G3184 Mild cognitive impairment, so stated: Secondary | ICD-10-CM | POA: Diagnosis not present

## 2023-06-05 DIAGNOSIS — J309 Allergic rhinitis, unspecified: Secondary | ICD-10-CM | POA: Diagnosis not present

## 2023-06-05 DIAGNOSIS — E559 Vitamin D deficiency, unspecified: Secondary | ICD-10-CM | POA: Diagnosis not present

## 2023-06-05 DIAGNOSIS — E785 Hyperlipidemia, unspecified: Secondary | ICD-10-CM | POA: Diagnosis not present

## 2023-06-05 DIAGNOSIS — E1122 Type 2 diabetes mellitus with diabetic chronic kidney disease: Secondary | ICD-10-CM | POA: Diagnosis not present

## 2023-06-09 DIAGNOSIS — I129 Hypertensive chronic kidney disease with stage 1 through stage 4 chronic kidney disease, or unspecified chronic kidney disease: Secondary | ICD-10-CM | POA: Diagnosis not present

## 2023-06-09 DIAGNOSIS — I10 Essential (primary) hypertension: Secondary | ICD-10-CM | POA: Diagnosis not present

## 2023-06-09 DIAGNOSIS — K5900 Constipation, unspecified: Secondary | ICD-10-CM | POA: Diagnosis not present

## 2023-06-09 DIAGNOSIS — R1310 Dysphagia, unspecified: Secondary | ICD-10-CM | POA: Diagnosis not present

## 2023-06-09 DIAGNOSIS — M069 Rheumatoid arthritis, unspecified: Secondary | ICD-10-CM | POA: Diagnosis not present

## 2023-06-09 DIAGNOSIS — D631 Anemia in chronic kidney disease: Secondary | ICD-10-CM | POA: Diagnosis not present

## 2023-06-09 DIAGNOSIS — M4800 Spinal stenosis, site unspecified: Secondary | ICD-10-CM | POA: Diagnosis not present

## 2023-06-09 DIAGNOSIS — N1831 Chronic kidney disease, stage 3a: Secondary | ICD-10-CM | POA: Diagnosis not present

## 2023-06-09 DIAGNOSIS — E039 Hypothyroidism, unspecified: Secondary | ICD-10-CM | POA: Diagnosis not present

## 2023-06-09 DIAGNOSIS — E559 Vitamin D deficiency, unspecified: Secondary | ICD-10-CM | POA: Diagnosis not present

## 2023-06-09 DIAGNOSIS — Z466 Encounter for fitting and adjustment of urinary device: Secondary | ICD-10-CM | POA: Diagnosis not present

## 2023-06-09 DIAGNOSIS — G4700 Insomnia, unspecified: Secondary | ICD-10-CM | POA: Diagnosis not present

## 2023-06-09 DIAGNOSIS — E785 Hyperlipidemia, unspecified: Secondary | ICD-10-CM | POA: Diagnosis not present

## 2023-06-09 DIAGNOSIS — E1151 Type 2 diabetes mellitus with diabetic peripheral angiopathy without gangrene: Secondary | ICD-10-CM | POA: Diagnosis not present

## 2023-06-09 DIAGNOSIS — G3184 Mild cognitive impairment, so stated: Secondary | ICD-10-CM | POA: Diagnosis not present

## 2023-06-09 DIAGNOSIS — Z7952 Long term (current) use of systemic steroids: Secondary | ICD-10-CM | POA: Diagnosis not present

## 2023-06-09 DIAGNOSIS — J309 Allergic rhinitis, unspecified: Secondary | ICD-10-CM | POA: Diagnosis not present

## 2023-06-09 DIAGNOSIS — E1122 Type 2 diabetes mellitus with diabetic chronic kidney disease: Secondary | ICD-10-CM | POA: Diagnosis not present

## 2023-06-09 DIAGNOSIS — S81802D Unspecified open wound, left lower leg, subsequent encounter: Secondary | ICD-10-CM | POA: Diagnosis not present

## 2023-06-09 DIAGNOSIS — G894 Chronic pain syndrome: Secondary | ICD-10-CM | POA: Diagnosis not present

## 2023-06-10 DIAGNOSIS — K5900 Constipation, unspecified: Secondary | ICD-10-CM | POA: Diagnosis not present

## 2023-06-10 DIAGNOSIS — N1832 Chronic kidney disease, stage 3b: Secondary | ICD-10-CM | POA: Diagnosis not present

## 2023-06-10 DIAGNOSIS — I129 Hypertensive chronic kidney disease with stage 1 through stage 4 chronic kidney disease, or unspecified chronic kidney disease: Secondary | ICD-10-CM | POA: Diagnosis not present

## 2023-06-12 DIAGNOSIS — K5900 Constipation, unspecified: Secondary | ICD-10-CM | POA: Diagnosis not present

## 2023-06-12 DIAGNOSIS — G3184 Mild cognitive impairment, so stated: Secondary | ICD-10-CM | POA: Diagnosis not present

## 2023-06-12 DIAGNOSIS — R1310 Dysphagia, unspecified: Secondary | ICD-10-CM | POA: Diagnosis not present

## 2023-06-12 DIAGNOSIS — D631 Anemia in chronic kidney disease: Secondary | ICD-10-CM | POA: Diagnosis not present

## 2023-06-12 DIAGNOSIS — E785 Hyperlipidemia, unspecified: Secondary | ICD-10-CM | POA: Diagnosis not present

## 2023-06-12 DIAGNOSIS — M069 Rheumatoid arthritis, unspecified: Secondary | ICD-10-CM | POA: Diagnosis not present

## 2023-06-12 DIAGNOSIS — E039 Hypothyroidism, unspecified: Secondary | ICD-10-CM | POA: Diagnosis not present

## 2023-06-12 DIAGNOSIS — E559 Vitamin D deficiency, unspecified: Secondary | ICD-10-CM | POA: Diagnosis not present

## 2023-06-12 DIAGNOSIS — E1122 Type 2 diabetes mellitus with diabetic chronic kidney disease: Secondary | ICD-10-CM | POA: Diagnosis not present

## 2023-06-12 DIAGNOSIS — E1151 Type 2 diabetes mellitus with diabetic peripheral angiopathy without gangrene: Secondary | ICD-10-CM | POA: Diagnosis not present

## 2023-06-12 DIAGNOSIS — Z7952 Long term (current) use of systemic steroids: Secondary | ICD-10-CM | POA: Diagnosis not present

## 2023-06-12 DIAGNOSIS — G894 Chronic pain syndrome: Secondary | ICD-10-CM | POA: Diagnosis not present

## 2023-06-12 DIAGNOSIS — I129 Hypertensive chronic kidney disease with stage 1 through stage 4 chronic kidney disease, or unspecified chronic kidney disease: Secondary | ICD-10-CM | POA: Diagnosis not present

## 2023-06-12 DIAGNOSIS — N1831 Chronic kidney disease, stage 3a: Secondary | ICD-10-CM | POA: Diagnosis not present

## 2023-06-12 DIAGNOSIS — J309 Allergic rhinitis, unspecified: Secondary | ICD-10-CM | POA: Diagnosis not present

## 2023-06-12 DIAGNOSIS — G4700 Insomnia, unspecified: Secondary | ICD-10-CM | POA: Diagnosis not present

## 2023-06-12 DIAGNOSIS — S81802D Unspecified open wound, left lower leg, subsequent encounter: Secondary | ICD-10-CM | POA: Diagnosis not present

## 2023-06-12 DIAGNOSIS — M4800 Spinal stenosis, site unspecified: Secondary | ICD-10-CM | POA: Diagnosis not present

## 2023-06-12 DIAGNOSIS — Z466 Encounter for fitting and adjustment of urinary device: Secondary | ICD-10-CM | POA: Diagnosis not present

## 2023-06-15 DIAGNOSIS — E559 Vitamin D deficiency, unspecified: Secondary | ICD-10-CM | POA: Diagnosis not present

## 2023-06-15 DIAGNOSIS — D631 Anemia in chronic kidney disease: Secondary | ICD-10-CM | POA: Diagnosis not present

## 2023-06-15 DIAGNOSIS — G3184 Mild cognitive impairment, so stated: Secondary | ICD-10-CM | POA: Diagnosis not present

## 2023-06-15 DIAGNOSIS — Z7952 Long term (current) use of systemic steroids: Secondary | ICD-10-CM | POA: Diagnosis not present

## 2023-06-15 DIAGNOSIS — G4700 Insomnia, unspecified: Secondary | ICD-10-CM | POA: Diagnosis not present

## 2023-06-15 DIAGNOSIS — S81802D Unspecified open wound, left lower leg, subsequent encounter: Secondary | ICD-10-CM | POA: Diagnosis not present

## 2023-06-15 DIAGNOSIS — G894 Chronic pain syndrome: Secondary | ICD-10-CM | POA: Diagnosis not present

## 2023-06-15 DIAGNOSIS — E039 Hypothyroidism, unspecified: Secondary | ICD-10-CM | POA: Diagnosis not present

## 2023-06-15 DIAGNOSIS — J309 Allergic rhinitis, unspecified: Secondary | ICD-10-CM | POA: Diagnosis not present

## 2023-06-15 DIAGNOSIS — E1151 Type 2 diabetes mellitus with diabetic peripheral angiopathy without gangrene: Secondary | ICD-10-CM | POA: Diagnosis not present

## 2023-06-15 DIAGNOSIS — E785 Hyperlipidemia, unspecified: Secondary | ICD-10-CM | POA: Diagnosis not present

## 2023-06-15 DIAGNOSIS — N1831 Chronic kidney disease, stage 3a: Secondary | ICD-10-CM | POA: Diagnosis not present

## 2023-06-15 DIAGNOSIS — K5900 Constipation, unspecified: Secondary | ICD-10-CM | POA: Diagnosis not present

## 2023-06-15 DIAGNOSIS — I129 Hypertensive chronic kidney disease with stage 1 through stage 4 chronic kidney disease, or unspecified chronic kidney disease: Secondary | ICD-10-CM | POA: Diagnosis not present

## 2023-06-15 DIAGNOSIS — E1122 Type 2 diabetes mellitus with diabetic chronic kidney disease: Secondary | ICD-10-CM | POA: Diagnosis not present

## 2023-06-15 DIAGNOSIS — R1310 Dysphagia, unspecified: Secondary | ICD-10-CM | POA: Diagnosis not present

## 2023-06-15 DIAGNOSIS — M069 Rheumatoid arthritis, unspecified: Secondary | ICD-10-CM | POA: Diagnosis not present

## 2023-06-15 DIAGNOSIS — M4800 Spinal stenosis, site unspecified: Secondary | ICD-10-CM | POA: Diagnosis not present

## 2023-06-15 DIAGNOSIS — Z466 Encounter for fitting and adjustment of urinary device: Secondary | ICD-10-CM | POA: Diagnosis not present

## 2023-06-17 DIAGNOSIS — H6123 Impacted cerumen, bilateral: Secondary | ICD-10-CM | POA: Diagnosis not present

## 2023-06-17 DIAGNOSIS — M4800 Spinal stenosis, site unspecified: Secondary | ICD-10-CM | POA: Diagnosis not present

## 2023-06-17 DIAGNOSIS — G894 Chronic pain syndrome: Secondary | ICD-10-CM | POA: Diagnosis not present

## 2023-06-17 DIAGNOSIS — M5090 Cervical disc disorder, unspecified, unspecified cervical region: Secondary | ICD-10-CM | POA: Diagnosis not present

## 2023-06-18 DIAGNOSIS — G3184 Mild cognitive impairment, so stated: Secondary | ICD-10-CM | POA: Diagnosis not present

## 2023-06-18 DIAGNOSIS — E785 Hyperlipidemia, unspecified: Secondary | ICD-10-CM | POA: Diagnosis not present

## 2023-06-18 DIAGNOSIS — I129 Hypertensive chronic kidney disease with stage 1 through stage 4 chronic kidney disease, or unspecified chronic kidney disease: Secondary | ICD-10-CM | POA: Diagnosis not present

## 2023-06-18 DIAGNOSIS — R1310 Dysphagia, unspecified: Secondary | ICD-10-CM | POA: Diagnosis not present

## 2023-06-18 DIAGNOSIS — E1122 Type 2 diabetes mellitus with diabetic chronic kidney disease: Secondary | ICD-10-CM | POA: Diagnosis not present

## 2023-06-18 DIAGNOSIS — Z7952 Long term (current) use of systemic steroids: Secondary | ICD-10-CM | POA: Diagnosis not present

## 2023-06-18 DIAGNOSIS — E1151 Type 2 diabetes mellitus with diabetic peripheral angiopathy without gangrene: Secondary | ICD-10-CM | POA: Diagnosis not present

## 2023-06-18 DIAGNOSIS — D631 Anemia in chronic kidney disease: Secondary | ICD-10-CM | POA: Diagnosis not present

## 2023-06-18 DIAGNOSIS — M4800 Spinal stenosis, site unspecified: Secondary | ICD-10-CM | POA: Diagnosis not present

## 2023-06-18 DIAGNOSIS — E039 Hypothyroidism, unspecified: Secondary | ICD-10-CM | POA: Diagnosis not present

## 2023-06-18 DIAGNOSIS — G894 Chronic pain syndrome: Secondary | ICD-10-CM | POA: Diagnosis not present

## 2023-06-18 DIAGNOSIS — J309 Allergic rhinitis, unspecified: Secondary | ICD-10-CM | POA: Diagnosis not present

## 2023-06-18 DIAGNOSIS — S81802D Unspecified open wound, left lower leg, subsequent encounter: Secondary | ICD-10-CM | POA: Diagnosis not present

## 2023-06-18 DIAGNOSIS — Z466 Encounter for fitting and adjustment of urinary device: Secondary | ICD-10-CM | POA: Diagnosis not present

## 2023-06-18 DIAGNOSIS — M069 Rheumatoid arthritis, unspecified: Secondary | ICD-10-CM | POA: Diagnosis not present

## 2023-06-18 DIAGNOSIS — N1831 Chronic kidney disease, stage 3a: Secondary | ICD-10-CM | POA: Diagnosis not present

## 2023-06-18 DIAGNOSIS — G4700 Insomnia, unspecified: Secondary | ICD-10-CM | POA: Diagnosis not present

## 2023-06-18 DIAGNOSIS — K5900 Constipation, unspecified: Secondary | ICD-10-CM | POA: Diagnosis not present

## 2023-06-18 DIAGNOSIS — E559 Vitamin D deficiency, unspecified: Secondary | ICD-10-CM | POA: Diagnosis not present

## 2023-06-22 DIAGNOSIS — I129 Hypertensive chronic kidney disease with stage 1 through stage 4 chronic kidney disease, or unspecified chronic kidney disease: Secondary | ICD-10-CM | POA: Diagnosis not present

## 2023-06-22 DIAGNOSIS — R1310 Dysphagia, unspecified: Secondary | ICD-10-CM | POA: Diagnosis not present

## 2023-06-22 DIAGNOSIS — K5900 Constipation, unspecified: Secondary | ICD-10-CM | POA: Diagnosis not present

## 2023-06-22 DIAGNOSIS — D631 Anemia in chronic kidney disease: Secondary | ICD-10-CM | POA: Diagnosis not present

## 2023-06-22 DIAGNOSIS — G3184 Mild cognitive impairment, so stated: Secondary | ICD-10-CM | POA: Diagnosis not present

## 2023-06-22 DIAGNOSIS — G894 Chronic pain syndrome: Secondary | ICD-10-CM | POA: Diagnosis not present

## 2023-06-22 DIAGNOSIS — Z7952 Long term (current) use of systemic steroids: Secondary | ICD-10-CM | POA: Diagnosis not present

## 2023-06-22 DIAGNOSIS — S81802D Unspecified open wound, left lower leg, subsequent encounter: Secondary | ICD-10-CM | POA: Diagnosis not present

## 2023-06-22 DIAGNOSIS — M069 Rheumatoid arthritis, unspecified: Secondary | ICD-10-CM | POA: Diagnosis not present

## 2023-06-22 DIAGNOSIS — Z466 Encounter for fitting and adjustment of urinary device: Secondary | ICD-10-CM | POA: Diagnosis not present

## 2023-06-22 DIAGNOSIS — M4800 Spinal stenosis, site unspecified: Secondary | ICD-10-CM | POA: Diagnosis not present

## 2023-06-22 DIAGNOSIS — J309 Allergic rhinitis, unspecified: Secondary | ICD-10-CM | POA: Diagnosis not present

## 2023-06-22 DIAGNOSIS — E1122 Type 2 diabetes mellitus with diabetic chronic kidney disease: Secondary | ICD-10-CM | POA: Diagnosis not present

## 2023-06-22 DIAGNOSIS — N1831 Chronic kidney disease, stage 3a: Secondary | ICD-10-CM | POA: Diagnosis not present

## 2023-06-22 DIAGNOSIS — G4700 Insomnia, unspecified: Secondary | ICD-10-CM | POA: Diagnosis not present

## 2023-06-22 DIAGNOSIS — E785 Hyperlipidemia, unspecified: Secondary | ICD-10-CM | POA: Diagnosis not present

## 2023-06-22 DIAGNOSIS — E039 Hypothyroidism, unspecified: Secondary | ICD-10-CM | POA: Diagnosis not present

## 2023-06-22 DIAGNOSIS — E1151 Type 2 diabetes mellitus with diabetic peripheral angiopathy without gangrene: Secondary | ICD-10-CM | POA: Diagnosis not present

## 2023-06-22 DIAGNOSIS — E559 Vitamin D deficiency, unspecified: Secondary | ICD-10-CM | POA: Diagnosis not present

## 2023-06-26 DIAGNOSIS — G3184 Mild cognitive impairment, so stated: Secondary | ICD-10-CM | POA: Diagnosis not present

## 2023-06-30 DIAGNOSIS — J309 Allergic rhinitis, unspecified: Secondary | ICD-10-CM | POA: Diagnosis not present

## 2023-06-30 DIAGNOSIS — G3184 Mild cognitive impairment, so stated: Secondary | ICD-10-CM | POA: Diagnosis not present

## 2023-06-30 DIAGNOSIS — Z7952 Long term (current) use of systemic steroids: Secondary | ICD-10-CM | POA: Diagnosis not present

## 2023-06-30 DIAGNOSIS — E1122 Type 2 diabetes mellitus with diabetic chronic kidney disease: Secondary | ICD-10-CM | POA: Diagnosis not present

## 2023-06-30 DIAGNOSIS — E785 Hyperlipidemia, unspecified: Secondary | ICD-10-CM | POA: Diagnosis not present

## 2023-06-30 DIAGNOSIS — E559 Vitamin D deficiency, unspecified: Secondary | ICD-10-CM | POA: Diagnosis not present

## 2023-06-30 DIAGNOSIS — D631 Anemia in chronic kidney disease: Secondary | ICD-10-CM | POA: Diagnosis not present

## 2023-06-30 DIAGNOSIS — R1310 Dysphagia, unspecified: Secondary | ICD-10-CM | POA: Diagnosis not present

## 2023-06-30 DIAGNOSIS — E039 Hypothyroidism, unspecified: Secondary | ICD-10-CM | POA: Diagnosis not present

## 2023-06-30 DIAGNOSIS — E1151 Type 2 diabetes mellitus with diabetic peripheral angiopathy without gangrene: Secondary | ICD-10-CM | POA: Diagnosis not present

## 2023-06-30 DIAGNOSIS — M4800 Spinal stenosis, site unspecified: Secondary | ICD-10-CM | POA: Diagnosis not present

## 2023-06-30 DIAGNOSIS — Z466 Encounter for fitting and adjustment of urinary device: Secondary | ICD-10-CM | POA: Diagnosis not present

## 2023-06-30 DIAGNOSIS — I129 Hypertensive chronic kidney disease with stage 1 through stage 4 chronic kidney disease, or unspecified chronic kidney disease: Secondary | ICD-10-CM | POA: Diagnosis not present

## 2023-06-30 DIAGNOSIS — G894 Chronic pain syndrome: Secondary | ICD-10-CM | POA: Diagnosis not present

## 2023-06-30 DIAGNOSIS — M069 Rheumatoid arthritis, unspecified: Secondary | ICD-10-CM | POA: Diagnosis not present

## 2023-06-30 DIAGNOSIS — K5900 Constipation, unspecified: Secondary | ICD-10-CM | POA: Diagnosis not present

## 2023-06-30 DIAGNOSIS — S81802D Unspecified open wound, left lower leg, subsequent encounter: Secondary | ICD-10-CM | POA: Diagnosis not present

## 2023-06-30 DIAGNOSIS — G4700 Insomnia, unspecified: Secondary | ICD-10-CM | POA: Diagnosis not present

## 2023-06-30 DIAGNOSIS — N1831 Chronic kidney disease, stage 3a: Secondary | ICD-10-CM | POA: Diagnosis not present

## 2023-07-01 DIAGNOSIS — G4700 Insomnia, unspecified: Secondary | ICD-10-CM | POA: Diagnosis not present

## 2023-07-01 DIAGNOSIS — N1831 Chronic kidney disease, stage 3a: Secondary | ICD-10-CM | POA: Diagnosis not present

## 2023-07-01 DIAGNOSIS — J309 Allergic rhinitis, unspecified: Secondary | ICD-10-CM | POA: Diagnosis not present

## 2023-07-01 DIAGNOSIS — I129 Hypertensive chronic kidney disease with stage 1 through stage 4 chronic kidney disease, or unspecified chronic kidney disease: Secondary | ICD-10-CM | POA: Diagnosis not present

## 2023-07-06 DIAGNOSIS — E785 Hyperlipidemia, unspecified: Secondary | ICD-10-CM | POA: Diagnosis not present

## 2023-07-06 DIAGNOSIS — I10 Essential (primary) hypertension: Secondary | ICD-10-CM | POA: Diagnosis not present

## 2023-07-06 DIAGNOSIS — Z7952 Long term (current) use of systemic steroids: Secondary | ICD-10-CM | POA: Diagnosis not present

## 2023-07-06 DIAGNOSIS — J309 Allergic rhinitis, unspecified: Secondary | ICD-10-CM | POA: Diagnosis not present

## 2023-07-06 DIAGNOSIS — K5900 Constipation, unspecified: Secondary | ICD-10-CM | POA: Diagnosis not present

## 2023-07-06 DIAGNOSIS — G3184 Mild cognitive impairment, so stated: Secondary | ICD-10-CM | POA: Diagnosis not present

## 2023-07-06 DIAGNOSIS — M4800 Spinal stenosis, site unspecified: Secondary | ICD-10-CM | POA: Diagnosis not present

## 2023-07-06 DIAGNOSIS — N1831 Chronic kidney disease, stage 3a: Secondary | ICD-10-CM | POA: Diagnosis not present

## 2023-07-06 DIAGNOSIS — Z466 Encounter for fitting and adjustment of urinary device: Secondary | ICD-10-CM | POA: Diagnosis not present

## 2023-07-06 DIAGNOSIS — R1310 Dysphagia, unspecified: Secondary | ICD-10-CM | POA: Diagnosis not present

## 2023-07-06 DIAGNOSIS — I129 Hypertensive chronic kidney disease with stage 1 through stage 4 chronic kidney disease, or unspecified chronic kidney disease: Secondary | ICD-10-CM | POA: Diagnosis not present

## 2023-07-06 DIAGNOSIS — E1151 Type 2 diabetes mellitus with diabetic peripheral angiopathy without gangrene: Secondary | ICD-10-CM | POA: Diagnosis not present

## 2023-07-06 DIAGNOSIS — M069 Rheumatoid arthritis, unspecified: Secondary | ICD-10-CM | POA: Diagnosis not present

## 2023-07-06 DIAGNOSIS — E039 Hypothyroidism, unspecified: Secondary | ICD-10-CM | POA: Diagnosis not present

## 2023-07-06 DIAGNOSIS — E1122 Type 2 diabetes mellitus with diabetic chronic kidney disease: Secondary | ICD-10-CM | POA: Diagnosis not present

## 2023-07-06 DIAGNOSIS — G4700 Insomnia, unspecified: Secondary | ICD-10-CM | POA: Diagnosis not present

## 2023-07-06 DIAGNOSIS — S81802D Unspecified open wound, left lower leg, subsequent encounter: Secondary | ICD-10-CM | POA: Diagnosis not present

## 2023-07-06 DIAGNOSIS — G894 Chronic pain syndrome: Secondary | ICD-10-CM | POA: Diagnosis not present

## 2023-07-06 DIAGNOSIS — D631 Anemia in chronic kidney disease: Secondary | ICD-10-CM | POA: Diagnosis not present

## 2023-07-06 DIAGNOSIS — E559 Vitamin D deficiency, unspecified: Secondary | ICD-10-CM | POA: Diagnosis not present

## 2023-07-13 DIAGNOSIS — E1151 Type 2 diabetes mellitus with diabetic peripheral angiopathy without gangrene: Secondary | ICD-10-CM | POA: Diagnosis not present

## 2023-07-13 DIAGNOSIS — G4700 Insomnia, unspecified: Secondary | ICD-10-CM | POA: Diagnosis not present

## 2023-07-13 DIAGNOSIS — M069 Rheumatoid arthritis, unspecified: Secondary | ICD-10-CM | POA: Diagnosis not present

## 2023-07-13 DIAGNOSIS — R1310 Dysphagia, unspecified: Secondary | ICD-10-CM | POA: Diagnosis not present

## 2023-07-13 DIAGNOSIS — Z466 Encounter for fitting and adjustment of urinary device: Secondary | ICD-10-CM | POA: Diagnosis not present

## 2023-07-13 DIAGNOSIS — K5900 Constipation, unspecified: Secondary | ICD-10-CM | POA: Diagnosis not present

## 2023-07-13 DIAGNOSIS — S81802D Unspecified open wound, left lower leg, subsequent encounter: Secondary | ICD-10-CM | POA: Diagnosis not present

## 2023-07-13 DIAGNOSIS — G894 Chronic pain syndrome: Secondary | ICD-10-CM | POA: Diagnosis not present

## 2023-07-13 DIAGNOSIS — G3184 Mild cognitive impairment, so stated: Secondary | ICD-10-CM | POA: Diagnosis not present

## 2023-07-13 DIAGNOSIS — E785 Hyperlipidemia, unspecified: Secondary | ICD-10-CM | POA: Diagnosis not present

## 2023-07-13 DIAGNOSIS — E1122 Type 2 diabetes mellitus with diabetic chronic kidney disease: Secondary | ICD-10-CM | POA: Diagnosis not present

## 2023-07-13 DIAGNOSIS — Z7952 Long term (current) use of systemic steroids: Secondary | ICD-10-CM | POA: Diagnosis not present

## 2023-07-13 DIAGNOSIS — I129 Hypertensive chronic kidney disease with stage 1 through stage 4 chronic kidney disease, or unspecified chronic kidney disease: Secondary | ICD-10-CM | POA: Diagnosis not present

## 2023-07-13 DIAGNOSIS — J309 Allergic rhinitis, unspecified: Secondary | ICD-10-CM | POA: Diagnosis not present

## 2023-07-13 DIAGNOSIS — E559 Vitamin D deficiency, unspecified: Secondary | ICD-10-CM | POA: Diagnosis not present

## 2023-07-13 DIAGNOSIS — N1831 Chronic kidney disease, stage 3a: Secondary | ICD-10-CM | POA: Diagnosis not present

## 2023-07-13 DIAGNOSIS — M4800 Spinal stenosis, site unspecified: Secondary | ICD-10-CM | POA: Diagnosis not present

## 2023-07-13 DIAGNOSIS — E039 Hypothyroidism, unspecified: Secondary | ICD-10-CM | POA: Diagnosis not present

## 2023-07-13 DIAGNOSIS — D631 Anemia in chronic kidney disease: Secondary | ICD-10-CM | POA: Diagnosis not present

## 2023-07-15 DIAGNOSIS — G2581 Restless legs syndrome: Secondary | ICD-10-CM

## 2023-07-15 HISTORY — DX: Restless legs syndrome: G25.81

## 2023-07-20 DIAGNOSIS — B372 Candidiasis of skin and nail: Secondary | ICD-10-CM | POA: Diagnosis not present

## 2023-07-20 DIAGNOSIS — Z993 Dependence on wheelchair: Secondary | ICD-10-CM | POA: Diagnosis not present

## 2023-07-20 DIAGNOSIS — I872 Venous insufficiency (chronic) (peripheral): Secondary | ICD-10-CM | POA: Diagnosis not present

## 2023-07-21 DIAGNOSIS — R1310 Dysphagia, unspecified: Secondary | ICD-10-CM | POA: Diagnosis not present

## 2023-07-21 DIAGNOSIS — G894 Chronic pain syndrome: Secondary | ICD-10-CM | POA: Diagnosis not present

## 2023-07-21 DIAGNOSIS — K5900 Constipation, unspecified: Secondary | ICD-10-CM | POA: Diagnosis not present

## 2023-07-21 DIAGNOSIS — Z7982 Long term (current) use of aspirin: Secondary | ICD-10-CM | POA: Diagnosis not present

## 2023-07-21 DIAGNOSIS — E1122 Type 2 diabetes mellitus with diabetic chronic kidney disease: Secondary | ICD-10-CM | POA: Diagnosis not present

## 2023-07-21 DIAGNOSIS — G4700 Insomnia, unspecified: Secondary | ICD-10-CM | POA: Diagnosis not present

## 2023-07-21 DIAGNOSIS — Z79899 Other long term (current) drug therapy: Secondary | ICD-10-CM | POA: Diagnosis not present

## 2023-07-21 DIAGNOSIS — G3184 Mild cognitive impairment, so stated: Secondary | ICD-10-CM | POA: Diagnosis not present

## 2023-07-21 DIAGNOSIS — E1151 Type 2 diabetes mellitus with diabetic peripheral angiopathy without gangrene: Secondary | ICD-10-CM | POA: Diagnosis not present

## 2023-07-21 DIAGNOSIS — D631 Anemia in chronic kidney disease: Secondary | ICD-10-CM | POA: Diagnosis not present

## 2023-07-21 DIAGNOSIS — J309 Allergic rhinitis, unspecified: Secondary | ICD-10-CM | POA: Diagnosis not present

## 2023-07-21 DIAGNOSIS — E039 Hypothyroidism, unspecified: Secondary | ICD-10-CM | POA: Diagnosis not present

## 2023-07-21 DIAGNOSIS — I129 Hypertensive chronic kidney disease with stage 1 through stage 4 chronic kidney disease, or unspecified chronic kidney disease: Secondary | ICD-10-CM | POA: Diagnosis not present

## 2023-07-21 DIAGNOSIS — M069 Rheumatoid arthritis, unspecified: Secondary | ICD-10-CM | POA: Diagnosis not present

## 2023-07-21 DIAGNOSIS — N1831 Chronic kidney disease, stage 3a: Secondary | ICD-10-CM | POA: Diagnosis not present

## 2023-07-21 DIAGNOSIS — E785 Hyperlipidemia, unspecified: Secondary | ICD-10-CM | POA: Diagnosis not present

## 2023-07-21 DIAGNOSIS — M4800 Spinal stenosis, site unspecified: Secondary | ICD-10-CM | POA: Diagnosis not present

## 2023-07-21 DIAGNOSIS — E559 Vitamin D deficiency, unspecified: Secondary | ICD-10-CM | POA: Diagnosis not present

## 2023-07-21 DIAGNOSIS — Z7952 Long term (current) use of systemic steroids: Secondary | ICD-10-CM | POA: Diagnosis not present

## 2023-07-22 DIAGNOSIS — I129 Hypertensive chronic kidney disease with stage 1 through stage 4 chronic kidney disease, or unspecified chronic kidney disease: Secondary | ICD-10-CM | POA: Diagnosis not present

## 2023-07-22 DIAGNOSIS — N1831 Chronic kidney disease, stage 3a: Secondary | ICD-10-CM | POA: Diagnosis not present

## 2023-07-22 DIAGNOSIS — G4700 Insomnia, unspecified: Secondary | ICD-10-CM | POA: Diagnosis not present

## 2023-07-22 DIAGNOSIS — J309 Allergic rhinitis, unspecified: Secondary | ICD-10-CM | POA: Diagnosis not present

## 2023-07-23 DIAGNOSIS — G3184 Mild cognitive impairment, so stated: Secondary | ICD-10-CM | POA: Diagnosis not present

## 2023-07-24 DIAGNOSIS — G4700 Insomnia, unspecified: Secondary | ICD-10-CM | POA: Diagnosis not present

## 2023-07-24 DIAGNOSIS — R1310 Dysphagia, unspecified: Secondary | ICD-10-CM | POA: Diagnosis not present

## 2023-07-24 DIAGNOSIS — E039 Hypothyroidism, unspecified: Secondary | ICD-10-CM | POA: Diagnosis not present

## 2023-07-24 DIAGNOSIS — Z79899 Other long term (current) drug therapy: Secondary | ICD-10-CM | POA: Diagnosis not present

## 2023-07-24 DIAGNOSIS — J309 Allergic rhinitis, unspecified: Secondary | ICD-10-CM | POA: Diagnosis not present

## 2023-07-24 DIAGNOSIS — Z7952 Long term (current) use of systemic steroids: Secondary | ICD-10-CM | POA: Diagnosis not present

## 2023-07-24 DIAGNOSIS — E559 Vitamin D deficiency, unspecified: Secondary | ICD-10-CM | POA: Diagnosis not present

## 2023-07-24 DIAGNOSIS — G3184 Mild cognitive impairment, so stated: Secondary | ICD-10-CM | POA: Diagnosis not present

## 2023-07-24 DIAGNOSIS — N1831 Chronic kidney disease, stage 3a: Secondary | ICD-10-CM | POA: Diagnosis not present

## 2023-07-24 DIAGNOSIS — M069 Rheumatoid arthritis, unspecified: Secondary | ICD-10-CM | POA: Diagnosis not present

## 2023-07-24 DIAGNOSIS — M4800 Spinal stenosis, site unspecified: Secondary | ICD-10-CM | POA: Diagnosis not present

## 2023-07-24 DIAGNOSIS — G894 Chronic pain syndrome: Secondary | ICD-10-CM | POA: Diagnosis not present

## 2023-07-24 DIAGNOSIS — Z7982 Long term (current) use of aspirin: Secondary | ICD-10-CM | POA: Diagnosis not present

## 2023-07-24 DIAGNOSIS — E1151 Type 2 diabetes mellitus with diabetic peripheral angiopathy without gangrene: Secondary | ICD-10-CM | POA: Diagnosis not present

## 2023-07-24 DIAGNOSIS — K5900 Constipation, unspecified: Secondary | ICD-10-CM | POA: Diagnosis not present

## 2023-07-24 DIAGNOSIS — E785 Hyperlipidemia, unspecified: Secondary | ICD-10-CM | POA: Diagnosis not present

## 2023-07-24 DIAGNOSIS — D631 Anemia in chronic kidney disease: Secondary | ICD-10-CM | POA: Diagnosis not present

## 2023-07-24 DIAGNOSIS — I129 Hypertensive chronic kidney disease with stage 1 through stage 4 chronic kidney disease, or unspecified chronic kidney disease: Secondary | ICD-10-CM | POA: Diagnosis not present

## 2023-07-24 DIAGNOSIS — E1122 Type 2 diabetes mellitus with diabetic chronic kidney disease: Secondary | ICD-10-CM | POA: Diagnosis not present

## 2023-07-28 DIAGNOSIS — Z79899 Other long term (current) drug therapy: Secondary | ICD-10-CM | POA: Diagnosis not present

## 2023-07-28 DIAGNOSIS — G4700 Insomnia, unspecified: Secondary | ICD-10-CM | POA: Diagnosis not present

## 2023-07-28 DIAGNOSIS — M4800 Spinal stenosis, site unspecified: Secondary | ICD-10-CM | POA: Diagnosis not present

## 2023-07-28 DIAGNOSIS — K5900 Constipation, unspecified: Secondary | ICD-10-CM | POA: Diagnosis not present

## 2023-07-28 DIAGNOSIS — G3184 Mild cognitive impairment, so stated: Secondary | ICD-10-CM | POA: Diagnosis not present

## 2023-07-28 DIAGNOSIS — R1310 Dysphagia, unspecified: Secondary | ICD-10-CM | POA: Diagnosis not present

## 2023-07-28 DIAGNOSIS — N1831 Chronic kidney disease, stage 3a: Secondary | ICD-10-CM | POA: Diagnosis not present

## 2023-07-28 DIAGNOSIS — Z7952 Long term (current) use of systemic steroids: Secondary | ICD-10-CM | POA: Diagnosis not present

## 2023-07-28 DIAGNOSIS — E559 Vitamin D deficiency, unspecified: Secondary | ICD-10-CM | POA: Diagnosis not present

## 2023-07-28 DIAGNOSIS — D631 Anemia in chronic kidney disease: Secondary | ICD-10-CM | POA: Diagnosis not present

## 2023-07-28 DIAGNOSIS — Z7982 Long term (current) use of aspirin: Secondary | ICD-10-CM | POA: Diagnosis not present

## 2023-07-28 DIAGNOSIS — E1122 Type 2 diabetes mellitus with diabetic chronic kidney disease: Secondary | ICD-10-CM | POA: Diagnosis not present

## 2023-07-28 DIAGNOSIS — I129 Hypertensive chronic kidney disease with stage 1 through stage 4 chronic kidney disease, or unspecified chronic kidney disease: Secondary | ICD-10-CM | POA: Diagnosis not present

## 2023-07-28 DIAGNOSIS — J309 Allergic rhinitis, unspecified: Secondary | ICD-10-CM | POA: Diagnosis not present

## 2023-07-28 DIAGNOSIS — E1151 Type 2 diabetes mellitus with diabetic peripheral angiopathy without gangrene: Secondary | ICD-10-CM | POA: Diagnosis not present

## 2023-07-28 DIAGNOSIS — E039 Hypothyroidism, unspecified: Secondary | ICD-10-CM | POA: Diagnosis not present

## 2023-07-28 DIAGNOSIS — G894 Chronic pain syndrome: Secondary | ICD-10-CM | POA: Diagnosis not present

## 2023-07-28 DIAGNOSIS — M069 Rheumatoid arthritis, unspecified: Secondary | ICD-10-CM | POA: Diagnosis not present

## 2023-07-28 DIAGNOSIS — E785 Hyperlipidemia, unspecified: Secondary | ICD-10-CM | POA: Diagnosis not present

## 2023-07-29 DIAGNOSIS — G894 Chronic pain syndrome: Secondary | ICD-10-CM | POA: Diagnosis not present

## 2023-07-29 DIAGNOSIS — M4800 Spinal stenosis, site unspecified: Secondary | ICD-10-CM | POA: Diagnosis not present

## 2023-07-29 DIAGNOSIS — M5090 Cervical disc disorder, unspecified, unspecified cervical region: Secondary | ICD-10-CM | POA: Diagnosis not present

## 2023-07-29 IMAGING — MR MR THORACIC SPINE W/O CM
6 of 8 series · 32 of 48 positions shown · non-contrast
Comparison: None.

CLINICAL DATA: Upper and lower extremity weakness

EXAM:
MRI CERVICAL, THORACIC AND LUMBAR SPINE WITHOUT CONTRAST
TECHNIQUE: Multiplanar and multiecho pulse sequences of the cervical spine, to
include the craniocervical junction and cervicothoracic junction,
and thoracic and lumbar spine, were obtained without intravenous
contrast.

[Series 2: T1 · sagittal · 4.0mm · 1.72mm/px · 1 of 5 slices shown (1 of 3)]
[im 1/5]
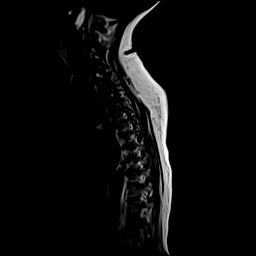

[Series 3: STIR · sagittal · 3.0mm · 1.00mm/px · 3 of 15 slices shown]
[im 1/15]
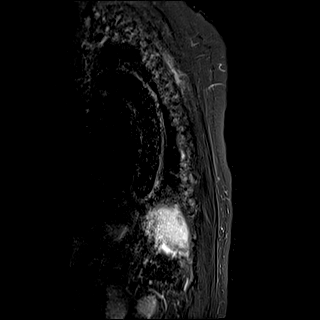
[im 3/15]
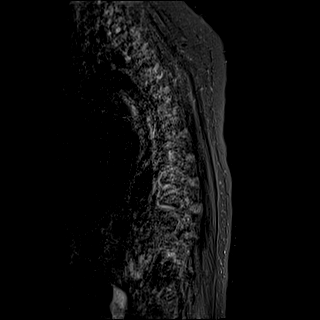
[im 6/15]
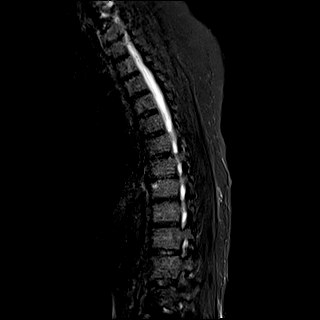

[Series 4: T1 · sagittal · 3.0mm · 1.00mm/px · 7 of 15 slices shown (2 of 3)]
[im 1/15]
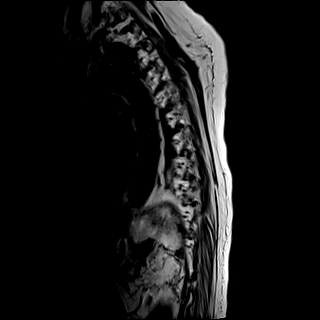
[im 3/15]
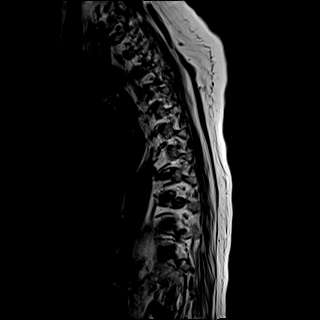
[im 5/15]
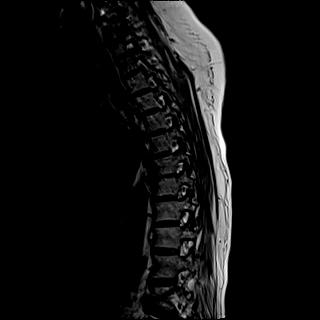
[im 8/15]
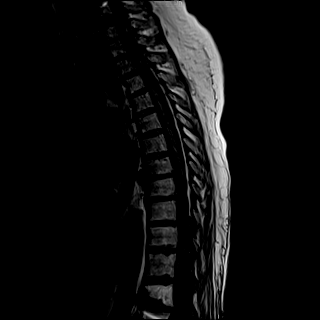
[im 10/15]
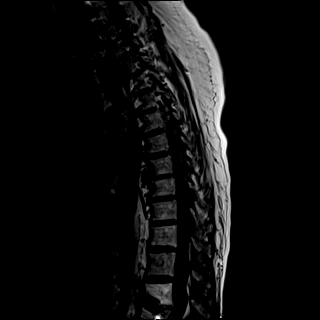
[im 12/15]
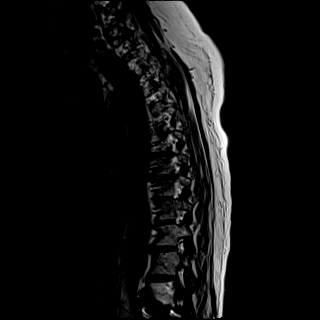
[im 15/15]
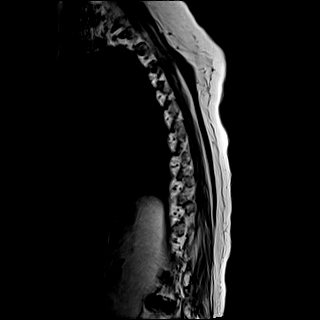

[Series 5: T2 · sagittal · 3.0mm · 0.83mm/px · 7 of 15 slices shown (1 of 2)]
[im 1/15]
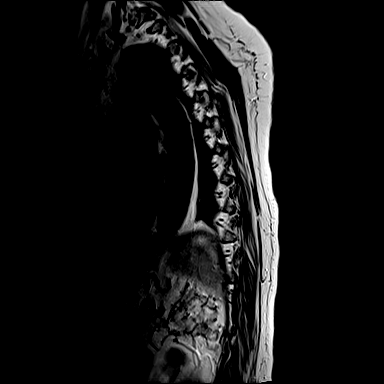
[im 3/15]
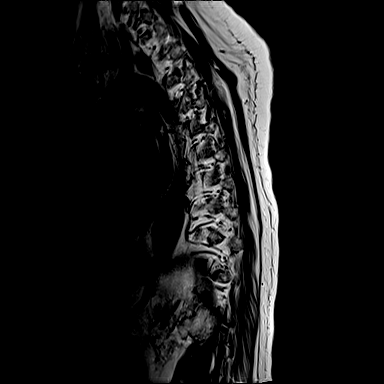
[im 5/15]
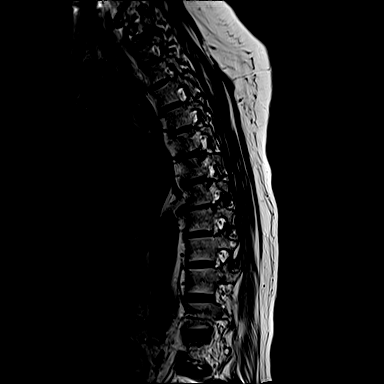
[im 8/15]
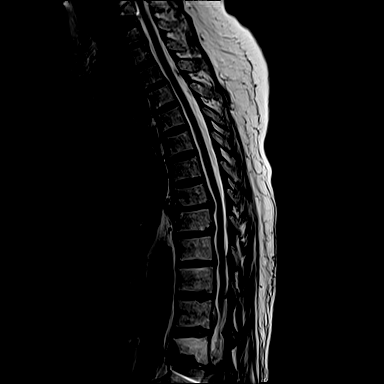
[im 10/15]
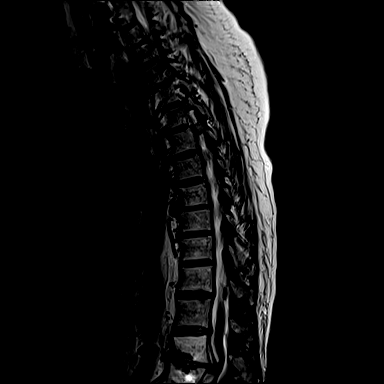
[im 12/15]
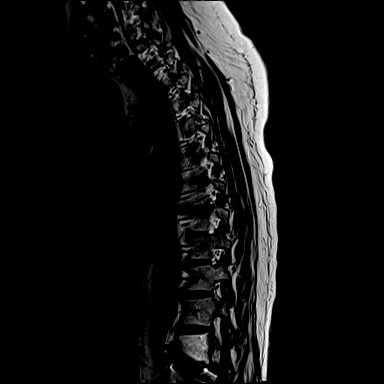
[im 15/15]
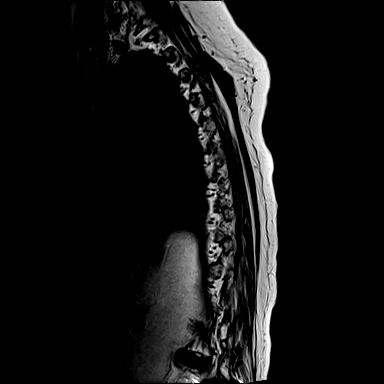

[Series 6: T2 · axial · 4.0mm · 0.78mm/px · z∈[-212,-31]mm · 7 of 15 slices shown (2 of 2)]
[im 1/15]
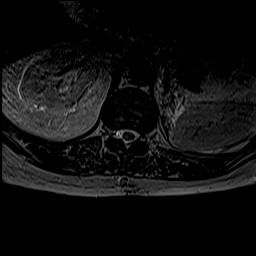
[im 3/15]
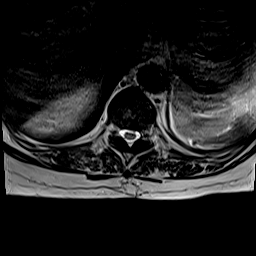
[im 5/15]
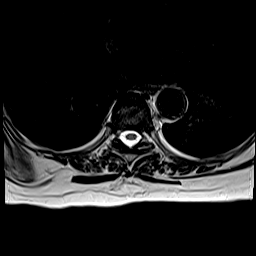
[im 8/15]
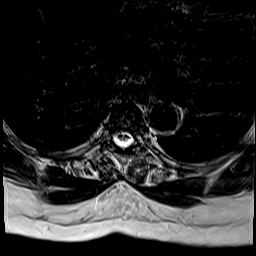
[im 10/15]
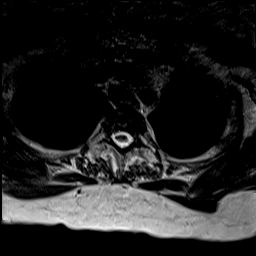
[im 12/15]
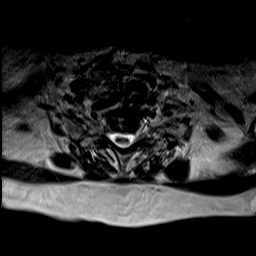
[im 15/15]
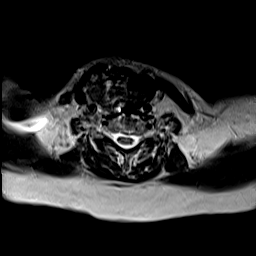

[Series 8: T1 · axial · 4.0mm · 0.39mm/px · z∈[-212,-31]mm · 7 of 15 slices shown (3 of 3)]
[im 1/15]
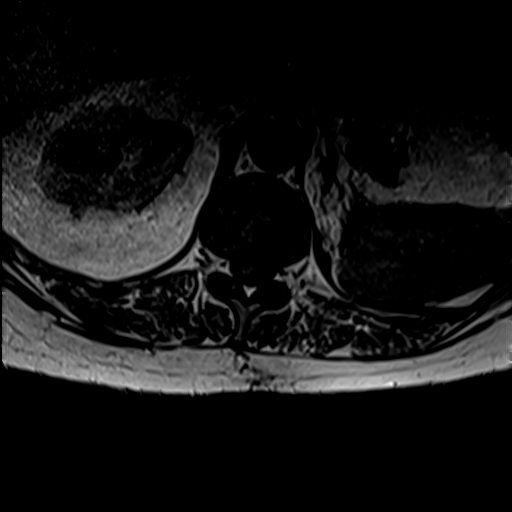
[im 3/15]
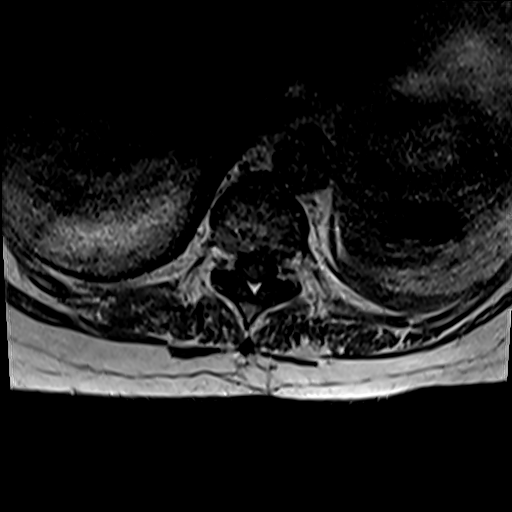
[im 5/15]
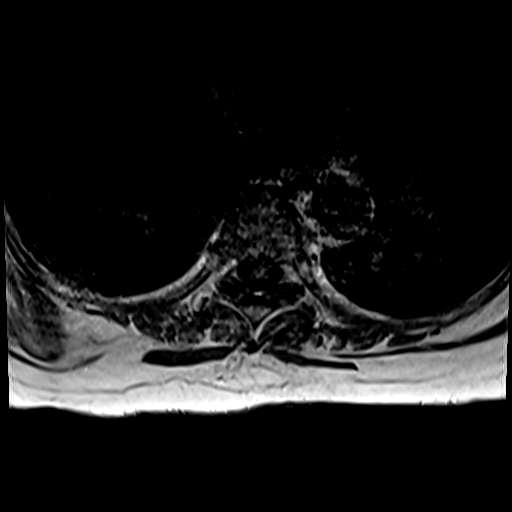
[im 8/15]
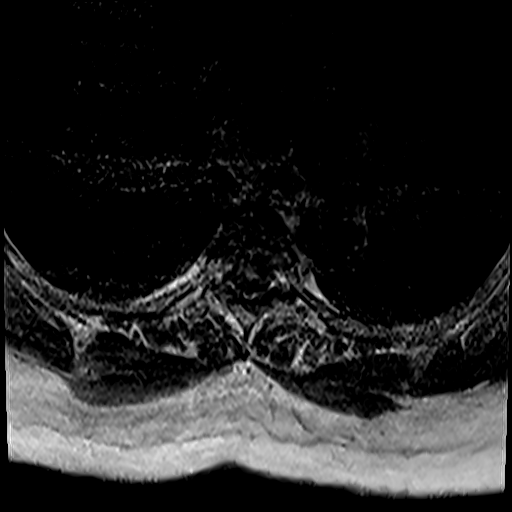
[im 10/15]
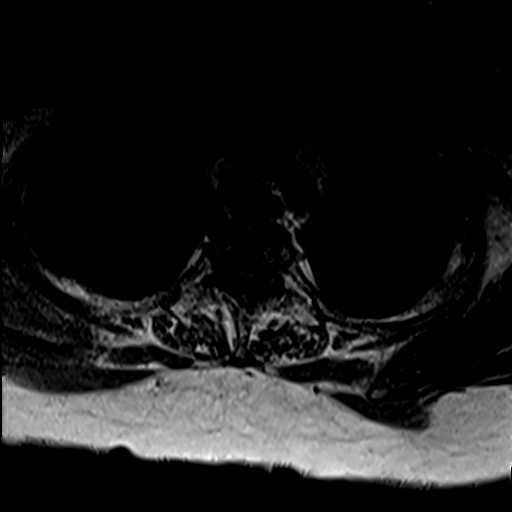
[im 12/15]
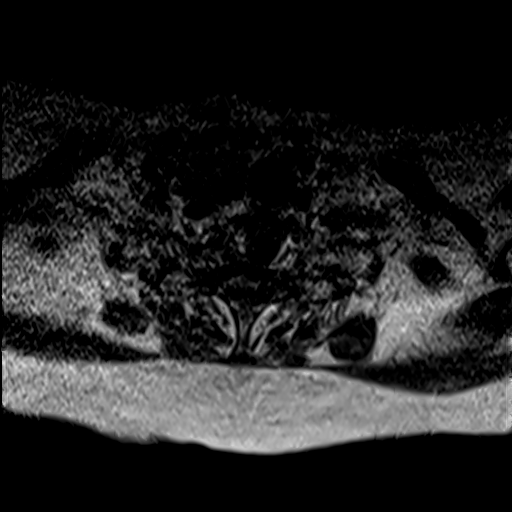
[im 15/15]
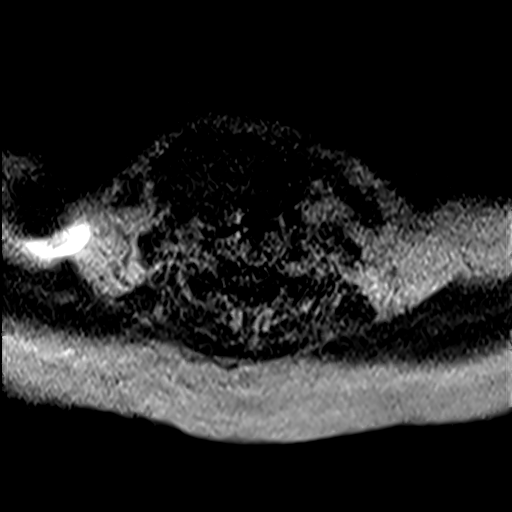

[32 of 48 positions shown; findings below may reference images not displayed]

FINDINGS: MRI CERVICAL SPINE FINDINGS

Alignment: Physiologic.

Vertebrae: C4-7 ACDF

Cord: Mildly increased T2-weighted signal within the spinal cord at
the C3 level

Posterior Fossa, vertebral arteries, paraspinal tissues: Negative

Disc levels:

At C3-4, there is a large central disc extrusion with superior
migration causing severe spinal canal stenosis and compressing the
spinal cord. There is no other cervical spinal canal stenosis or
advance neural foraminal stenosis.

MRI THORACIC SPINE FINDINGS

Alignment:  Physiologic.

Vertebrae: No fracture, evidence of discitis, or bone lesion.

Cord:  Normal signal and morphology.

Paraspinal and other soft tissues: Negative.

Disc levels:

T8-9 small central disc protrusion without stenosis.

The other thoracic levels are unremarkable.

MRI LUMBAR SPINE FINDINGS

Segmentation:  Standard.

Alignment:  Physiologic.

Vertebrae: L2-5 PLIF with posterior decompression. No acute
abnormality. Chronic height loss of L1.

Conus medullaris and cauda equina: Conus extends to the L1 level.

Paraspinal and other soft tissues: Left kidney is absent.

Disc levels:

T12-L1: Small left subarticular disc protrusion without spinal canal
or neural foraminal stenosis.

L1-L2: Large right subarticular disc protrusion causing severe
narrowing of the right lateral recess and severe central spinal
canal stenosis. This has worsened compared to 11/02/2018. No neural
foraminal stenosis.

L2-L3: Posterior fusion and decompression. No spinal canal stenosis.
No neural foraminal stenosis.

L3-L4: Posterior fusion and decompression. No spinal canal stenosis.
No neural foraminal stenosis.

L4-L5: Posterior fusion decompression. No spinal canal stenosis. No
neural foraminal stenosis.

L5-S1: Large central disc extrusion with inferior migration causing
mass effect on the left S1 nerve root in the lateral recess. No
central spinal canal stenosis. Severe bilateral neural foraminal
stenosis.

Visualized sacrum: Normal.
IMPRESSION: 1. Large central disc extrusion at C3-4 causing severe spinal canal
stenosis and compressing the spinal cord. Mildly increased
T2-weighted signal within the spinal cord consistent with
compressive myelopathy.
2. Large right subarticular disc protrusion at L1-L2 causing severe
right lateral recess and severe central spinal canal stenosis. This
has worsened compared to [DATE].
3. Large central disc extrusion at L5-S1 with inferior migration
causing mass effect on the left S1 nerve root in the lateral recess.
Severe bilateral L5-S1 neural foraminal stenosis.

Critical Value/emergent results were called by telephone at the time
of interpretation on 06/14/2021 at [DATE] to Dr. ARAB, Who
verbally acknowledged these results.

## 2023-07-30 DIAGNOSIS — J309 Allergic rhinitis, unspecified: Secondary | ICD-10-CM | POA: Diagnosis not present

## 2023-07-30 DIAGNOSIS — G4700 Insomnia, unspecified: Secondary | ICD-10-CM | POA: Diagnosis not present

## 2023-07-30 DIAGNOSIS — R1310 Dysphagia, unspecified: Secondary | ICD-10-CM | POA: Diagnosis not present

## 2023-07-30 DIAGNOSIS — G3184 Mild cognitive impairment, so stated: Secondary | ICD-10-CM | POA: Diagnosis not present

## 2023-07-30 DIAGNOSIS — E039 Hypothyroidism, unspecified: Secondary | ICD-10-CM | POA: Diagnosis not present

## 2023-07-30 DIAGNOSIS — M4800 Spinal stenosis, site unspecified: Secondary | ICD-10-CM | POA: Diagnosis not present

## 2023-07-30 DIAGNOSIS — K5900 Constipation, unspecified: Secondary | ICD-10-CM | POA: Diagnosis not present

## 2023-07-30 DIAGNOSIS — E785 Hyperlipidemia, unspecified: Secondary | ICD-10-CM | POA: Diagnosis not present

## 2023-07-30 DIAGNOSIS — G894 Chronic pain syndrome: Secondary | ICD-10-CM | POA: Diagnosis not present

## 2023-07-30 DIAGNOSIS — M069 Rheumatoid arthritis, unspecified: Secondary | ICD-10-CM | POA: Diagnosis not present

## 2023-07-30 DIAGNOSIS — E559 Vitamin D deficiency, unspecified: Secondary | ICD-10-CM | POA: Diagnosis not present

## 2023-07-30 DIAGNOSIS — N1831 Chronic kidney disease, stage 3a: Secondary | ICD-10-CM | POA: Diagnosis not present

## 2023-07-30 DIAGNOSIS — E1151 Type 2 diabetes mellitus with diabetic peripheral angiopathy without gangrene: Secondary | ICD-10-CM | POA: Diagnosis not present

## 2023-07-30 DIAGNOSIS — Z79899 Other long term (current) drug therapy: Secondary | ICD-10-CM | POA: Diagnosis not present

## 2023-07-30 DIAGNOSIS — I129 Hypertensive chronic kidney disease with stage 1 through stage 4 chronic kidney disease, or unspecified chronic kidney disease: Secondary | ICD-10-CM | POA: Diagnosis not present

## 2023-07-30 DIAGNOSIS — Z7982 Long term (current) use of aspirin: Secondary | ICD-10-CM | POA: Diagnosis not present

## 2023-07-30 DIAGNOSIS — Z7952 Long term (current) use of systemic steroids: Secondary | ICD-10-CM | POA: Diagnosis not present

## 2023-07-30 DIAGNOSIS — E1122 Type 2 diabetes mellitus with diabetic chronic kidney disease: Secondary | ICD-10-CM | POA: Diagnosis not present

## 2023-07-30 DIAGNOSIS — D631 Anemia in chronic kidney disease: Secondary | ICD-10-CM | POA: Diagnosis not present

## 2023-07-30 IMAGING — RF DG CERVICAL SPINE 1V
1 series · 1 of 1 positions shown · non-contrast
Comparison: None.

CLINICAL DATA: ACDF

EXAM:
DG CERVICAL SPINE - 1 VIEW

[Series 1: unknown protocol · left · 0.14mm/px · 1 of 1 slices shown]
[im 1/1]
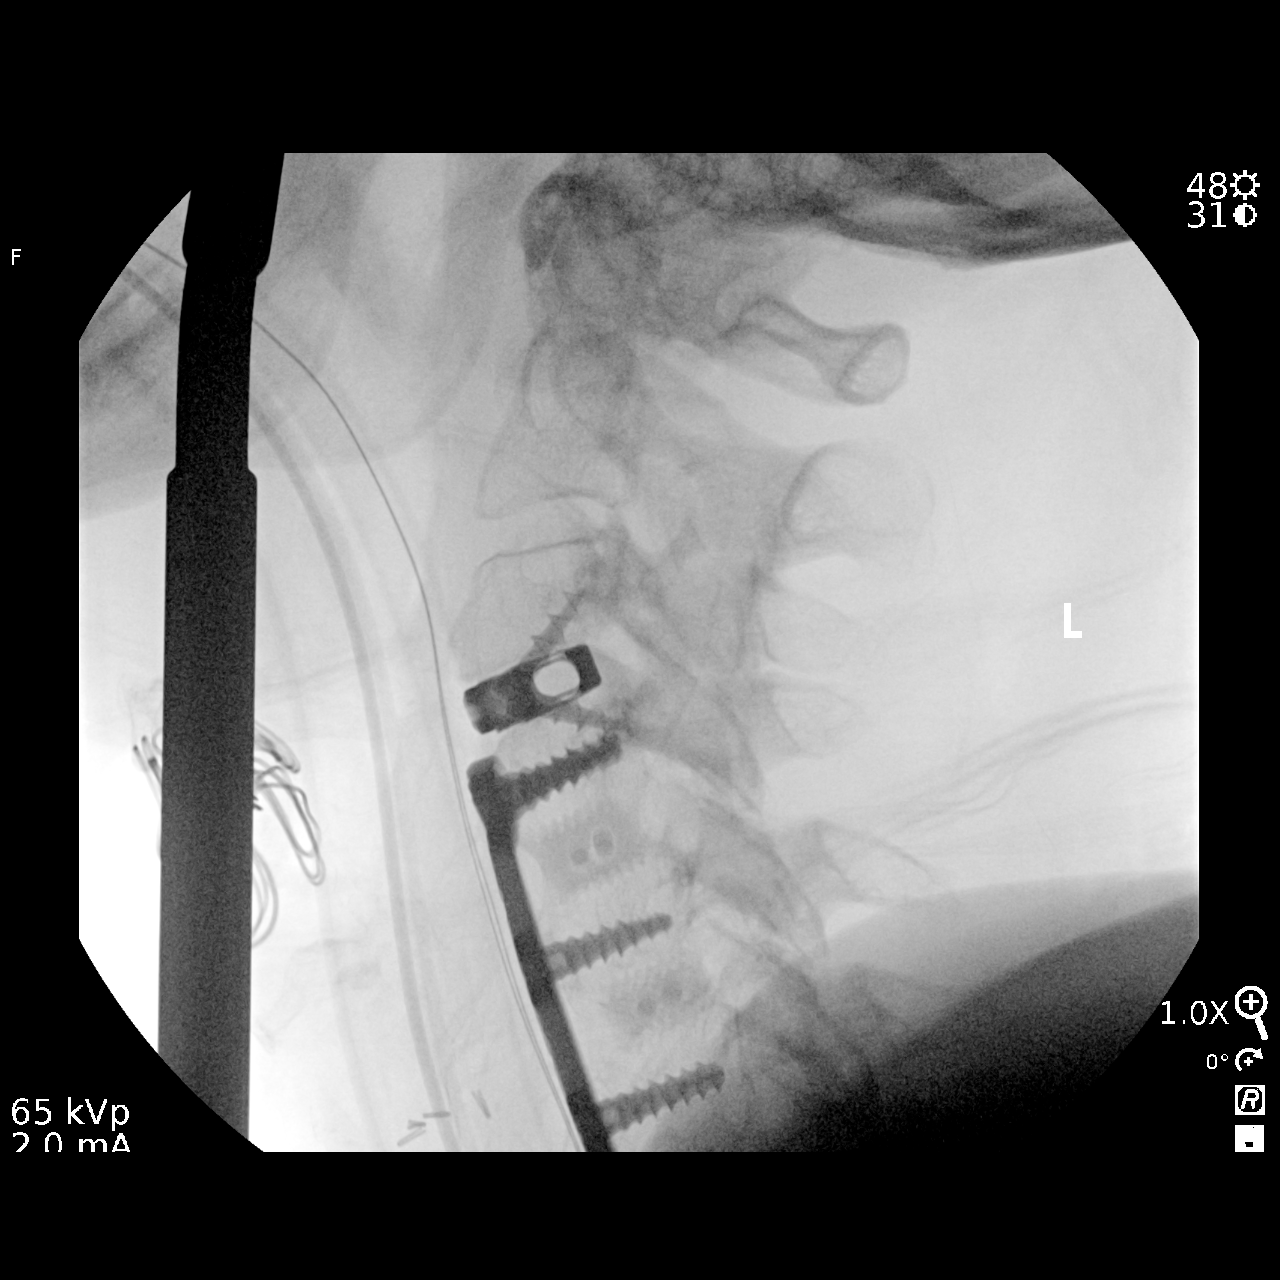

[1 of 1 positions shown; findings below may reference images not displayed]

FINDINGS: Post ACDF at C3-C4. Partially imaged prior ACDF extending inferiorly
from C4.
IMPRESSION: C3-C4 ACDF.

## 2023-08-04 DIAGNOSIS — M4800 Spinal stenosis, site unspecified: Secondary | ICD-10-CM | POA: Diagnosis not present

## 2023-08-04 DIAGNOSIS — R1310 Dysphagia, unspecified: Secondary | ICD-10-CM | POA: Diagnosis not present

## 2023-08-04 DIAGNOSIS — M069 Rheumatoid arthritis, unspecified: Secondary | ICD-10-CM | POA: Diagnosis not present

## 2023-08-04 DIAGNOSIS — E785 Hyperlipidemia, unspecified: Secondary | ICD-10-CM | POA: Diagnosis not present

## 2023-08-04 DIAGNOSIS — D631 Anemia in chronic kidney disease: Secondary | ICD-10-CM | POA: Diagnosis not present

## 2023-08-04 DIAGNOSIS — J309 Allergic rhinitis, unspecified: Secondary | ICD-10-CM | POA: Diagnosis not present

## 2023-08-04 DIAGNOSIS — I129 Hypertensive chronic kidney disease with stage 1 through stage 4 chronic kidney disease, or unspecified chronic kidney disease: Secondary | ICD-10-CM | POA: Diagnosis not present

## 2023-08-04 DIAGNOSIS — K5900 Constipation, unspecified: Secondary | ICD-10-CM | POA: Diagnosis not present

## 2023-08-04 DIAGNOSIS — G3184 Mild cognitive impairment, so stated: Secondary | ICD-10-CM | POA: Diagnosis not present

## 2023-08-04 DIAGNOSIS — M059 Rheumatoid arthritis with rheumatoid factor, unspecified: Secondary | ICD-10-CM | POA: Diagnosis not present

## 2023-08-04 DIAGNOSIS — G4700 Insomnia, unspecified: Secondary | ICD-10-CM | POA: Diagnosis not present

## 2023-08-04 DIAGNOSIS — G894 Chronic pain syndrome: Secondary | ICD-10-CM | POA: Diagnosis not present

## 2023-08-04 DIAGNOSIS — E039 Hypothyroidism, unspecified: Secondary | ICD-10-CM | POA: Diagnosis not present

## 2023-08-04 DIAGNOSIS — Z79899 Other long term (current) drug therapy: Secondary | ICD-10-CM | POA: Diagnosis not present

## 2023-08-04 DIAGNOSIS — N1831 Chronic kidney disease, stage 3a: Secondary | ICD-10-CM | POA: Diagnosis not present

## 2023-08-04 DIAGNOSIS — E1122 Type 2 diabetes mellitus with diabetic chronic kidney disease: Secondary | ICD-10-CM | POA: Diagnosis not present

## 2023-08-04 DIAGNOSIS — Z7982 Long term (current) use of aspirin: Secondary | ICD-10-CM | POA: Diagnosis not present

## 2023-08-04 DIAGNOSIS — E1151 Type 2 diabetes mellitus with diabetic peripheral angiopathy without gangrene: Secondary | ICD-10-CM | POA: Diagnosis not present

## 2023-08-04 DIAGNOSIS — E559 Vitamin D deficiency, unspecified: Secondary | ICD-10-CM | POA: Diagnosis not present

## 2023-08-04 DIAGNOSIS — Z7952 Long term (current) use of systemic steroids: Secondary | ICD-10-CM | POA: Diagnosis not present

## 2023-08-06 DIAGNOSIS — N1831 Chronic kidney disease, stage 3a: Secondary | ICD-10-CM | POA: Diagnosis not present

## 2023-08-06 DIAGNOSIS — J309 Allergic rhinitis, unspecified: Secondary | ICD-10-CM | POA: Diagnosis not present

## 2023-08-06 DIAGNOSIS — E559 Vitamin D deficiency, unspecified: Secondary | ICD-10-CM | POA: Diagnosis not present

## 2023-08-06 DIAGNOSIS — D631 Anemia in chronic kidney disease: Secondary | ICD-10-CM | POA: Diagnosis not present

## 2023-08-06 DIAGNOSIS — R1310 Dysphagia, unspecified: Secondary | ICD-10-CM | POA: Diagnosis not present

## 2023-08-06 DIAGNOSIS — Z79899 Other long term (current) drug therapy: Secondary | ICD-10-CM | POA: Diagnosis not present

## 2023-08-06 DIAGNOSIS — M069 Rheumatoid arthritis, unspecified: Secondary | ICD-10-CM | POA: Diagnosis not present

## 2023-08-06 DIAGNOSIS — I129 Hypertensive chronic kidney disease with stage 1 through stage 4 chronic kidney disease, or unspecified chronic kidney disease: Secondary | ICD-10-CM | POA: Diagnosis not present

## 2023-08-06 DIAGNOSIS — G894 Chronic pain syndrome: Secondary | ICD-10-CM | POA: Diagnosis not present

## 2023-08-06 DIAGNOSIS — G4700 Insomnia, unspecified: Secondary | ICD-10-CM | POA: Diagnosis not present

## 2023-08-06 DIAGNOSIS — E785 Hyperlipidemia, unspecified: Secondary | ICD-10-CM | POA: Diagnosis not present

## 2023-08-06 DIAGNOSIS — E1122 Type 2 diabetes mellitus with diabetic chronic kidney disease: Secondary | ICD-10-CM | POA: Diagnosis not present

## 2023-08-06 DIAGNOSIS — K5900 Constipation, unspecified: Secondary | ICD-10-CM | POA: Diagnosis not present

## 2023-08-06 DIAGNOSIS — M4800 Spinal stenosis, site unspecified: Secondary | ICD-10-CM | POA: Diagnosis not present

## 2023-08-06 DIAGNOSIS — Z7982 Long term (current) use of aspirin: Secondary | ICD-10-CM | POA: Diagnosis not present

## 2023-08-06 DIAGNOSIS — E039 Hypothyroidism, unspecified: Secondary | ICD-10-CM | POA: Diagnosis not present

## 2023-08-06 DIAGNOSIS — E1151 Type 2 diabetes mellitus with diabetic peripheral angiopathy without gangrene: Secondary | ICD-10-CM | POA: Diagnosis not present

## 2023-08-06 DIAGNOSIS — Z7952 Long term (current) use of systemic steroids: Secondary | ICD-10-CM | POA: Diagnosis not present

## 2023-08-06 DIAGNOSIS — G3184 Mild cognitive impairment, so stated: Secondary | ICD-10-CM | POA: Diagnosis not present

## 2023-08-10 DIAGNOSIS — Z79899 Other long term (current) drug therapy: Secondary | ICD-10-CM | POA: Diagnosis not present

## 2023-08-10 DIAGNOSIS — M069 Rheumatoid arthritis, unspecified: Secondary | ICD-10-CM | POA: Diagnosis not present

## 2023-08-10 DIAGNOSIS — E785 Hyperlipidemia, unspecified: Secondary | ICD-10-CM | POA: Diagnosis not present

## 2023-08-10 DIAGNOSIS — E1122 Type 2 diabetes mellitus with diabetic chronic kidney disease: Secondary | ICD-10-CM | POA: Diagnosis not present

## 2023-08-10 DIAGNOSIS — G3184 Mild cognitive impairment, so stated: Secondary | ICD-10-CM | POA: Diagnosis not present

## 2023-08-10 DIAGNOSIS — Z7952 Long term (current) use of systemic steroids: Secondary | ICD-10-CM | POA: Diagnosis not present

## 2023-08-10 DIAGNOSIS — R1310 Dysphagia, unspecified: Secondary | ICD-10-CM | POA: Diagnosis not present

## 2023-08-10 DIAGNOSIS — E039 Hypothyroidism, unspecified: Secondary | ICD-10-CM | POA: Diagnosis not present

## 2023-08-10 DIAGNOSIS — E559 Vitamin D deficiency, unspecified: Secondary | ICD-10-CM | POA: Diagnosis not present

## 2023-08-10 DIAGNOSIS — D631 Anemia in chronic kidney disease: Secondary | ICD-10-CM | POA: Diagnosis not present

## 2023-08-10 DIAGNOSIS — G894 Chronic pain syndrome: Secondary | ICD-10-CM | POA: Diagnosis not present

## 2023-08-10 DIAGNOSIS — N1831 Chronic kidney disease, stage 3a: Secondary | ICD-10-CM | POA: Diagnosis not present

## 2023-08-10 DIAGNOSIS — I129 Hypertensive chronic kidney disease with stage 1 through stage 4 chronic kidney disease, or unspecified chronic kidney disease: Secondary | ICD-10-CM | POA: Diagnosis not present

## 2023-08-10 DIAGNOSIS — K5900 Constipation, unspecified: Secondary | ICD-10-CM | POA: Diagnosis not present

## 2023-08-10 DIAGNOSIS — J309 Allergic rhinitis, unspecified: Secondary | ICD-10-CM | POA: Diagnosis not present

## 2023-08-10 DIAGNOSIS — E1151 Type 2 diabetes mellitus with diabetic peripheral angiopathy without gangrene: Secondary | ICD-10-CM | POA: Diagnosis not present

## 2023-08-10 DIAGNOSIS — Z7982 Long term (current) use of aspirin: Secondary | ICD-10-CM | POA: Diagnosis not present

## 2023-08-10 DIAGNOSIS — G4700 Insomnia, unspecified: Secondary | ICD-10-CM | POA: Diagnosis not present

## 2023-08-10 DIAGNOSIS — M4800 Spinal stenosis, site unspecified: Secondary | ICD-10-CM | POA: Diagnosis not present

## 2023-08-13 DIAGNOSIS — N1831 Chronic kidney disease, stage 3a: Secondary | ICD-10-CM | POA: Diagnosis not present

## 2023-08-13 DIAGNOSIS — E1122 Type 2 diabetes mellitus with diabetic chronic kidney disease: Secondary | ICD-10-CM | POA: Diagnosis not present

## 2023-08-13 DIAGNOSIS — I129 Hypertensive chronic kidney disease with stage 1 through stage 4 chronic kidney disease, or unspecified chronic kidney disease: Secondary | ICD-10-CM | POA: Diagnosis not present

## 2023-08-17 DIAGNOSIS — G3184 Mild cognitive impairment, so stated: Secondary | ICD-10-CM | POA: Diagnosis not present

## 2023-08-17 DIAGNOSIS — G4700 Insomnia, unspecified: Secondary | ICD-10-CM | POA: Diagnosis not present

## 2023-08-17 DIAGNOSIS — E559 Vitamin D deficiency, unspecified: Secondary | ICD-10-CM | POA: Diagnosis not present

## 2023-08-17 DIAGNOSIS — Z7982 Long term (current) use of aspirin: Secondary | ICD-10-CM | POA: Diagnosis not present

## 2023-08-17 DIAGNOSIS — G894 Chronic pain syndrome: Secondary | ICD-10-CM | POA: Diagnosis not present

## 2023-08-17 DIAGNOSIS — E1122 Type 2 diabetes mellitus with diabetic chronic kidney disease: Secondary | ICD-10-CM | POA: Diagnosis not present

## 2023-08-17 DIAGNOSIS — Z79899 Other long term (current) drug therapy: Secondary | ICD-10-CM | POA: Diagnosis not present

## 2023-08-17 DIAGNOSIS — N1831 Chronic kidney disease, stage 3a: Secondary | ICD-10-CM | POA: Diagnosis not present

## 2023-08-17 DIAGNOSIS — I129 Hypertensive chronic kidney disease with stage 1 through stage 4 chronic kidney disease, or unspecified chronic kidney disease: Secondary | ICD-10-CM | POA: Diagnosis not present

## 2023-08-17 DIAGNOSIS — M4800 Spinal stenosis, site unspecified: Secondary | ICD-10-CM | POA: Diagnosis not present

## 2023-08-17 DIAGNOSIS — E785 Hyperlipidemia, unspecified: Secondary | ICD-10-CM | POA: Diagnosis not present

## 2023-08-17 DIAGNOSIS — K5900 Constipation, unspecified: Secondary | ICD-10-CM | POA: Diagnosis not present

## 2023-08-17 DIAGNOSIS — D631 Anemia in chronic kidney disease: Secondary | ICD-10-CM | POA: Diagnosis not present

## 2023-08-17 DIAGNOSIS — R1310 Dysphagia, unspecified: Secondary | ICD-10-CM | POA: Diagnosis not present

## 2023-08-17 DIAGNOSIS — J309 Allergic rhinitis, unspecified: Secondary | ICD-10-CM | POA: Diagnosis not present

## 2023-08-17 DIAGNOSIS — E039 Hypothyroidism, unspecified: Secondary | ICD-10-CM | POA: Diagnosis not present

## 2023-08-17 DIAGNOSIS — E1151 Type 2 diabetes mellitus with diabetic peripheral angiopathy without gangrene: Secondary | ICD-10-CM | POA: Diagnosis not present

## 2023-08-17 DIAGNOSIS — Z7952 Long term (current) use of systemic steroids: Secondary | ICD-10-CM | POA: Diagnosis not present

## 2023-08-17 DIAGNOSIS — M069 Rheumatoid arthritis, unspecified: Secondary | ICD-10-CM | POA: Diagnosis not present

## 2023-08-19 DIAGNOSIS — G3184 Mild cognitive impairment, so stated: Secondary | ICD-10-CM | POA: Diagnosis not present

## 2023-08-24 DIAGNOSIS — G894 Chronic pain syndrome: Secondary | ICD-10-CM | POA: Diagnosis not present

## 2023-08-24 DIAGNOSIS — G4700 Insomnia, unspecified: Secondary | ICD-10-CM | POA: Diagnosis not present

## 2023-08-24 DIAGNOSIS — J309 Allergic rhinitis, unspecified: Secondary | ICD-10-CM | POA: Diagnosis not present

## 2023-08-24 DIAGNOSIS — R1310 Dysphagia, unspecified: Secondary | ICD-10-CM | POA: Diagnosis not present

## 2023-08-24 DIAGNOSIS — N1831 Chronic kidney disease, stage 3a: Secondary | ICD-10-CM | POA: Diagnosis not present

## 2023-08-24 DIAGNOSIS — M4800 Spinal stenosis, site unspecified: Secondary | ICD-10-CM | POA: Diagnosis not present

## 2023-08-24 DIAGNOSIS — D631 Anemia in chronic kidney disease: Secondary | ICD-10-CM | POA: Diagnosis not present

## 2023-08-24 DIAGNOSIS — M069 Rheumatoid arthritis, unspecified: Secondary | ICD-10-CM | POA: Diagnosis not present

## 2023-08-24 DIAGNOSIS — Z7982 Long term (current) use of aspirin: Secondary | ICD-10-CM | POA: Diagnosis not present

## 2023-08-24 DIAGNOSIS — E1122 Type 2 diabetes mellitus with diabetic chronic kidney disease: Secondary | ICD-10-CM | POA: Diagnosis not present

## 2023-08-24 DIAGNOSIS — Z7952 Long term (current) use of systemic steroids: Secondary | ICD-10-CM | POA: Diagnosis not present

## 2023-08-24 DIAGNOSIS — I129 Hypertensive chronic kidney disease with stage 1 through stage 4 chronic kidney disease, or unspecified chronic kidney disease: Secondary | ICD-10-CM | POA: Diagnosis not present

## 2023-08-24 DIAGNOSIS — E785 Hyperlipidemia, unspecified: Secondary | ICD-10-CM | POA: Diagnosis not present

## 2023-08-24 DIAGNOSIS — K5900 Constipation, unspecified: Secondary | ICD-10-CM | POA: Diagnosis not present

## 2023-08-24 DIAGNOSIS — E039 Hypothyroidism, unspecified: Secondary | ICD-10-CM | POA: Diagnosis not present

## 2023-08-24 DIAGNOSIS — E559 Vitamin D deficiency, unspecified: Secondary | ICD-10-CM | POA: Diagnosis not present

## 2023-08-24 DIAGNOSIS — G3184 Mild cognitive impairment, so stated: Secondary | ICD-10-CM | POA: Diagnosis not present

## 2023-08-24 DIAGNOSIS — E1151 Type 2 diabetes mellitus with diabetic peripheral angiopathy without gangrene: Secondary | ICD-10-CM | POA: Diagnosis not present

## 2023-08-24 DIAGNOSIS — Z79899 Other long term (current) drug therapy: Secondary | ICD-10-CM | POA: Diagnosis not present

## 2023-08-26 DIAGNOSIS — I129 Hypertensive chronic kidney disease with stage 1 through stage 4 chronic kidney disease, or unspecified chronic kidney disease: Secondary | ICD-10-CM | POA: Diagnosis not present

## 2023-08-26 DIAGNOSIS — J309 Allergic rhinitis, unspecified: Secondary | ICD-10-CM | POA: Diagnosis not present

## 2023-08-26 DIAGNOSIS — N1831 Chronic kidney disease, stage 3a: Secondary | ICD-10-CM | POA: Diagnosis not present

## 2023-08-26 DIAGNOSIS — G4700 Insomnia, unspecified: Secondary | ICD-10-CM | POA: Diagnosis not present

## 2023-09-02 DIAGNOSIS — M4800 Spinal stenosis, site unspecified: Secondary | ICD-10-CM | POA: Diagnosis not present

## 2023-09-02 DIAGNOSIS — E1122 Type 2 diabetes mellitus with diabetic chronic kidney disease: Secondary | ICD-10-CM | POA: Diagnosis not present

## 2023-09-02 DIAGNOSIS — G894 Chronic pain syndrome: Secondary | ICD-10-CM | POA: Diagnosis not present

## 2023-09-02 DIAGNOSIS — N1831 Chronic kidney disease, stage 3a: Secondary | ICD-10-CM | POA: Diagnosis not present

## 2023-09-02 DIAGNOSIS — M5090 Cervical disc disorder, unspecified, unspecified cervical region: Secondary | ICD-10-CM | POA: Diagnosis not present

## 2023-09-08 DIAGNOSIS — G3184 Mild cognitive impairment, so stated: Secondary | ICD-10-CM | POA: Diagnosis not present

## 2023-09-08 DIAGNOSIS — Z79899 Other long term (current) drug therapy: Secondary | ICD-10-CM | POA: Diagnosis not present

## 2023-09-08 DIAGNOSIS — M4800 Spinal stenosis, site unspecified: Secondary | ICD-10-CM | POA: Diagnosis not present

## 2023-09-08 DIAGNOSIS — Z7952 Long term (current) use of systemic steroids: Secondary | ICD-10-CM | POA: Diagnosis not present

## 2023-09-08 DIAGNOSIS — J309 Allergic rhinitis, unspecified: Secondary | ICD-10-CM | POA: Diagnosis not present

## 2023-09-08 DIAGNOSIS — Z7982 Long term (current) use of aspirin: Secondary | ICD-10-CM | POA: Diagnosis not present

## 2023-09-08 DIAGNOSIS — E039 Hypothyroidism, unspecified: Secondary | ICD-10-CM | POA: Diagnosis not present

## 2023-09-08 DIAGNOSIS — E559 Vitamin D deficiency, unspecified: Secondary | ICD-10-CM | POA: Diagnosis not present

## 2023-09-08 DIAGNOSIS — K5900 Constipation, unspecified: Secondary | ICD-10-CM | POA: Diagnosis not present

## 2023-09-08 DIAGNOSIS — R1310 Dysphagia, unspecified: Secondary | ICD-10-CM | POA: Diagnosis not present

## 2023-09-08 DIAGNOSIS — G4700 Insomnia, unspecified: Secondary | ICD-10-CM | POA: Diagnosis not present

## 2023-09-08 DIAGNOSIS — G894 Chronic pain syndrome: Secondary | ICD-10-CM | POA: Diagnosis not present

## 2023-09-08 DIAGNOSIS — M069 Rheumatoid arthritis, unspecified: Secondary | ICD-10-CM | POA: Diagnosis not present

## 2023-09-08 DIAGNOSIS — D631 Anemia in chronic kidney disease: Secondary | ICD-10-CM | POA: Diagnosis not present

## 2023-09-08 DIAGNOSIS — N1831 Chronic kidney disease, stage 3a: Secondary | ICD-10-CM | POA: Diagnosis not present

## 2023-09-08 DIAGNOSIS — I129 Hypertensive chronic kidney disease with stage 1 through stage 4 chronic kidney disease, or unspecified chronic kidney disease: Secondary | ICD-10-CM | POA: Diagnosis not present

## 2023-09-08 DIAGNOSIS — E1122 Type 2 diabetes mellitus with diabetic chronic kidney disease: Secondary | ICD-10-CM | POA: Diagnosis not present

## 2023-09-08 DIAGNOSIS — E785 Hyperlipidemia, unspecified: Secondary | ICD-10-CM | POA: Diagnosis not present

## 2023-09-08 DIAGNOSIS — E1151 Type 2 diabetes mellitus with diabetic peripheral angiopathy without gangrene: Secondary | ICD-10-CM | POA: Diagnosis not present

## 2023-09-15 DIAGNOSIS — K5900 Constipation, unspecified: Secondary | ICD-10-CM | POA: Diagnosis not present

## 2023-09-15 DIAGNOSIS — M4800 Spinal stenosis, site unspecified: Secondary | ICD-10-CM | POA: Diagnosis not present

## 2023-09-15 DIAGNOSIS — Z7982 Long term (current) use of aspirin: Secondary | ICD-10-CM | POA: Diagnosis not present

## 2023-09-15 DIAGNOSIS — E1122 Type 2 diabetes mellitus with diabetic chronic kidney disease: Secondary | ICD-10-CM | POA: Diagnosis not present

## 2023-09-15 DIAGNOSIS — D631 Anemia in chronic kidney disease: Secondary | ICD-10-CM | POA: Diagnosis not present

## 2023-09-15 DIAGNOSIS — G4700 Insomnia, unspecified: Secondary | ICD-10-CM | POA: Diagnosis not present

## 2023-09-15 DIAGNOSIS — N1831 Chronic kidney disease, stage 3a: Secondary | ICD-10-CM | POA: Diagnosis not present

## 2023-09-15 DIAGNOSIS — R1310 Dysphagia, unspecified: Secondary | ICD-10-CM | POA: Diagnosis not present

## 2023-09-15 DIAGNOSIS — E785 Hyperlipidemia, unspecified: Secondary | ICD-10-CM | POA: Diagnosis not present

## 2023-09-15 DIAGNOSIS — G894 Chronic pain syndrome: Secondary | ICD-10-CM | POA: Diagnosis not present

## 2023-09-15 DIAGNOSIS — Z7952 Long term (current) use of systemic steroids: Secondary | ICD-10-CM | POA: Diagnosis not present

## 2023-09-15 DIAGNOSIS — E559 Vitamin D deficiency, unspecified: Secondary | ICD-10-CM | POA: Diagnosis not present

## 2023-09-15 DIAGNOSIS — I129 Hypertensive chronic kidney disease with stage 1 through stage 4 chronic kidney disease, or unspecified chronic kidney disease: Secondary | ICD-10-CM | POA: Diagnosis not present

## 2023-09-15 DIAGNOSIS — M069 Rheumatoid arthritis, unspecified: Secondary | ICD-10-CM | POA: Diagnosis not present

## 2023-09-15 DIAGNOSIS — G3184 Mild cognitive impairment, so stated: Secondary | ICD-10-CM | POA: Diagnosis not present

## 2023-09-15 DIAGNOSIS — J309 Allergic rhinitis, unspecified: Secondary | ICD-10-CM | POA: Diagnosis not present

## 2023-09-15 DIAGNOSIS — E039 Hypothyroidism, unspecified: Secondary | ICD-10-CM | POA: Diagnosis not present

## 2023-09-15 DIAGNOSIS — E1151 Type 2 diabetes mellitus with diabetic peripheral angiopathy without gangrene: Secondary | ICD-10-CM | POA: Diagnosis not present

## 2023-09-15 DIAGNOSIS — Z79899 Other long term (current) drug therapy: Secondary | ICD-10-CM | POA: Diagnosis not present

## 2023-09-23 DIAGNOSIS — J309 Allergic rhinitis, unspecified: Secondary | ICD-10-CM | POA: Diagnosis not present

## 2023-09-23 DIAGNOSIS — N1831 Chronic kidney disease, stage 3a: Secondary | ICD-10-CM | POA: Diagnosis not present

## 2023-09-23 DIAGNOSIS — I129 Hypertensive chronic kidney disease with stage 1 through stage 4 chronic kidney disease, or unspecified chronic kidney disease: Secondary | ICD-10-CM | POA: Diagnosis not present

## 2023-09-23 DIAGNOSIS — G4700 Insomnia, unspecified: Secondary | ICD-10-CM | POA: Diagnosis not present

## 2023-09-25 DIAGNOSIS — G3184 Mild cognitive impairment, so stated: Secondary | ICD-10-CM | POA: Diagnosis not present

## 2023-09-26 DIAGNOSIS — Z85528 Personal history of other malignant neoplasm of kidney: Secondary | ICD-10-CM | POA: Diagnosis not present

## 2023-09-26 DIAGNOSIS — J309 Allergic rhinitis, unspecified: Secondary | ICD-10-CM | POA: Diagnosis not present

## 2023-09-26 DIAGNOSIS — Z87440 Personal history of urinary (tract) infections: Secondary | ICD-10-CM | POA: Diagnosis not present

## 2023-09-26 DIAGNOSIS — M069 Rheumatoid arthritis, unspecified: Secondary | ICD-10-CM | POA: Diagnosis not present

## 2023-09-26 DIAGNOSIS — Z7982 Long term (current) use of aspirin: Secondary | ICD-10-CM | POA: Diagnosis not present

## 2023-09-26 DIAGNOSIS — I129 Hypertensive chronic kidney disease with stage 1 through stage 4 chronic kidney disease, or unspecified chronic kidney disease: Secondary | ICD-10-CM | POA: Diagnosis not present

## 2023-09-26 DIAGNOSIS — E1122 Type 2 diabetes mellitus with diabetic chronic kidney disease: Secondary | ICD-10-CM | POA: Diagnosis not present

## 2023-09-26 DIAGNOSIS — G4700 Insomnia, unspecified: Secondary | ICD-10-CM | POA: Diagnosis not present

## 2023-09-26 DIAGNOSIS — S81811D Laceration without foreign body, right lower leg, subsequent encounter: Secondary | ICD-10-CM | POA: Diagnosis not present

## 2023-09-26 DIAGNOSIS — Z556 Problems related to health literacy: Secondary | ICD-10-CM | POA: Diagnosis not present

## 2023-09-26 DIAGNOSIS — N1831 Chronic kidney disease, stage 3a: Secondary | ICD-10-CM | POA: Diagnosis not present

## 2023-09-26 DIAGNOSIS — S91302D Unspecified open wound, left foot, subsequent encounter: Secondary | ICD-10-CM | POA: Diagnosis not present

## 2023-09-26 DIAGNOSIS — E785 Hyperlipidemia, unspecified: Secondary | ICD-10-CM | POA: Diagnosis not present

## 2023-09-26 DIAGNOSIS — D631 Anemia in chronic kidney disease: Secondary | ICD-10-CM | POA: Diagnosis not present

## 2023-09-26 DIAGNOSIS — E559 Vitamin D deficiency, unspecified: Secondary | ICD-10-CM | POA: Diagnosis not present

## 2023-09-26 DIAGNOSIS — Z7952 Long term (current) use of systemic steroids: Secondary | ICD-10-CM | POA: Diagnosis not present

## 2023-09-26 DIAGNOSIS — M4800 Spinal stenosis, site unspecified: Secondary | ICD-10-CM | POA: Diagnosis not present

## 2023-09-26 DIAGNOSIS — Z9181 History of falling: Secondary | ICD-10-CM | POA: Diagnosis not present

## 2023-09-26 DIAGNOSIS — E1151 Type 2 diabetes mellitus with diabetic peripheral angiopathy without gangrene: Secondary | ICD-10-CM | POA: Diagnosis not present

## 2023-09-26 DIAGNOSIS — G3184 Mild cognitive impairment, so stated: Secondary | ICD-10-CM | POA: Diagnosis not present

## 2023-10-02 DIAGNOSIS — G4700 Insomnia, unspecified: Secondary | ICD-10-CM | POA: Diagnosis not present

## 2023-10-02 DIAGNOSIS — N1831 Chronic kidney disease, stage 3a: Secondary | ICD-10-CM | POA: Diagnosis not present

## 2023-10-02 DIAGNOSIS — D631 Anemia in chronic kidney disease: Secondary | ICD-10-CM | POA: Diagnosis not present

## 2023-10-02 DIAGNOSIS — S81811D Laceration without foreign body, right lower leg, subsequent encounter: Secondary | ICD-10-CM | POA: Diagnosis not present

## 2023-10-02 DIAGNOSIS — S91302D Unspecified open wound, left foot, subsequent encounter: Secondary | ICD-10-CM | POA: Diagnosis not present

## 2023-10-02 DIAGNOSIS — E1122 Type 2 diabetes mellitus with diabetic chronic kidney disease: Secondary | ICD-10-CM | POA: Diagnosis not present

## 2023-10-02 DIAGNOSIS — M069 Rheumatoid arthritis, unspecified: Secondary | ICD-10-CM | POA: Diagnosis not present

## 2023-10-02 DIAGNOSIS — G3184 Mild cognitive impairment, so stated: Secondary | ICD-10-CM | POA: Diagnosis not present

## 2023-10-02 DIAGNOSIS — Z556 Problems related to health literacy: Secondary | ICD-10-CM | POA: Diagnosis not present

## 2023-10-02 DIAGNOSIS — Z87440 Personal history of urinary (tract) infections: Secondary | ICD-10-CM | POA: Diagnosis not present

## 2023-10-02 DIAGNOSIS — Z9181 History of falling: Secondary | ICD-10-CM | POA: Diagnosis not present

## 2023-10-02 DIAGNOSIS — M4800 Spinal stenosis, site unspecified: Secondary | ICD-10-CM | POA: Diagnosis not present

## 2023-10-02 DIAGNOSIS — E1151 Type 2 diabetes mellitus with diabetic peripheral angiopathy without gangrene: Secondary | ICD-10-CM | POA: Diagnosis not present

## 2023-10-02 DIAGNOSIS — Z7982 Long term (current) use of aspirin: Secondary | ICD-10-CM | POA: Diagnosis not present

## 2023-10-02 DIAGNOSIS — E785 Hyperlipidemia, unspecified: Secondary | ICD-10-CM | POA: Diagnosis not present

## 2023-10-02 DIAGNOSIS — J309 Allergic rhinitis, unspecified: Secondary | ICD-10-CM | POA: Diagnosis not present

## 2023-10-02 DIAGNOSIS — Z85528 Personal history of other malignant neoplasm of kidney: Secondary | ICD-10-CM | POA: Diagnosis not present

## 2023-10-02 DIAGNOSIS — Z7952 Long term (current) use of systemic steroids: Secondary | ICD-10-CM | POA: Diagnosis not present

## 2023-10-02 DIAGNOSIS — E559 Vitamin D deficiency, unspecified: Secondary | ICD-10-CM | POA: Diagnosis not present

## 2023-10-02 DIAGNOSIS — I129 Hypertensive chronic kidney disease with stage 1 through stage 4 chronic kidney disease, or unspecified chronic kidney disease: Secondary | ICD-10-CM | POA: Diagnosis not present

## 2023-10-06 DIAGNOSIS — Z7952 Long term (current) use of systemic steroids: Secondary | ICD-10-CM | POA: Diagnosis not present

## 2023-10-06 DIAGNOSIS — I129 Hypertensive chronic kidney disease with stage 1 through stage 4 chronic kidney disease, or unspecified chronic kidney disease: Secondary | ICD-10-CM | POA: Diagnosis not present

## 2023-10-06 DIAGNOSIS — N1831 Chronic kidney disease, stage 3a: Secondary | ICD-10-CM | POA: Diagnosis not present

## 2023-10-06 DIAGNOSIS — J309 Allergic rhinitis, unspecified: Secondary | ICD-10-CM | POA: Diagnosis not present

## 2023-10-06 DIAGNOSIS — D631 Anemia in chronic kidney disease: Secondary | ICD-10-CM | POA: Diagnosis not present

## 2023-10-06 DIAGNOSIS — E1122 Type 2 diabetes mellitus with diabetic chronic kidney disease: Secondary | ICD-10-CM | POA: Diagnosis not present

## 2023-10-06 DIAGNOSIS — Z85528 Personal history of other malignant neoplasm of kidney: Secondary | ICD-10-CM | POA: Diagnosis not present

## 2023-10-06 DIAGNOSIS — S91302D Unspecified open wound, left foot, subsequent encounter: Secondary | ICD-10-CM | POA: Diagnosis not present

## 2023-10-06 DIAGNOSIS — M4800 Spinal stenosis, site unspecified: Secondary | ICD-10-CM | POA: Diagnosis not present

## 2023-10-06 DIAGNOSIS — Z9181 History of falling: Secondary | ICD-10-CM | POA: Diagnosis not present

## 2023-10-06 DIAGNOSIS — E785 Hyperlipidemia, unspecified: Secondary | ICD-10-CM | POA: Diagnosis not present

## 2023-10-06 DIAGNOSIS — E1151 Type 2 diabetes mellitus with diabetic peripheral angiopathy without gangrene: Secondary | ICD-10-CM | POA: Diagnosis not present

## 2023-10-06 DIAGNOSIS — Z7982 Long term (current) use of aspirin: Secondary | ICD-10-CM | POA: Diagnosis not present

## 2023-10-06 DIAGNOSIS — Z556 Problems related to health literacy: Secondary | ICD-10-CM | POA: Diagnosis not present

## 2023-10-06 DIAGNOSIS — S81811D Laceration without foreign body, right lower leg, subsequent encounter: Secondary | ICD-10-CM | POA: Diagnosis not present

## 2023-10-06 DIAGNOSIS — M069 Rheumatoid arthritis, unspecified: Secondary | ICD-10-CM | POA: Diagnosis not present

## 2023-10-06 DIAGNOSIS — Z87440 Personal history of urinary (tract) infections: Secondary | ICD-10-CM | POA: Diagnosis not present

## 2023-10-06 DIAGNOSIS — G4700 Insomnia, unspecified: Secondary | ICD-10-CM | POA: Diagnosis not present

## 2023-10-06 DIAGNOSIS — G3184 Mild cognitive impairment, so stated: Secondary | ICD-10-CM | POA: Diagnosis not present

## 2023-10-06 DIAGNOSIS — E559 Vitamin D deficiency, unspecified: Secondary | ICD-10-CM | POA: Diagnosis not present

## 2023-10-09 DIAGNOSIS — Z7952 Long term (current) use of systemic steroids: Secondary | ICD-10-CM | POA: Diagnosis not present

## 2023-10-09 DIAGNOSIS — I129 Hypertensive chronic kidney disease with stage 1 through stage 4 chronic kidney disease, or unspecified chronic kidney disease: Secondary | ICD-10-CM | POA: Diagnosis not present

## 2023-10-09 DIAGNOSIS — E1122 Type 2 diabetes mellitus with diabetic chronic kidney disease: Secondary | ICD-10-CM | POA: Diagnosis not present

## 2023-10-09 DIAGNOSIS — Z7982 Long term (current) use of aspirin: Secondary | ICD-10-CM | POA: Diagnosis not present

## 2023-10-09 DIAGNOSIS — E785 Hyperlipidemia, unspecified: Secondary | ICD-10-CM | POA: Diagnosis not present

## 2023-10-09 DIAGNOSIS — E1151 Type 2 diabetes mellitus with diabetic peripheral angiopathy without gangrene: Secondary | ICD-10-CM | POA: Diagnosis not present

## 2023-10-09 DIAGNOSIS — D631 Anemia in chronic kidney disease: Secondary | ICD-10-CM | POA: Diagnosis not present

## 2023-10-09 DIAGNOSIS — M4800 Spinal stenosis, site unspecified: Secondary | ICD-10-CM | POA: Diagnosis not present

## 2023-10-09 DIAGNOSIS — S81811D Laceration without foreign body, right lower leg, subsequent encounter: Secondary | ICD-10-CM | POA: Diagnosis not present

## 2023-10-09 DIAGNOSIS — E559 Vitamin D deficiency, unspecified: Secondary | ICD-10-CM | POA: Diagnosis not present

## 2023-10-09 DIAGNOSIS — S91302D Unspecified open wound, left foot, subsequent encounter: Secondary | ICD-10-CM | POA: Diagnosis not present

## 2023-10-09 DIAGNOSIS — M069 Rheumatoid arthritis, unspecified: Secondary | ICD-10-CM | POA: Diagnosis not present

## 2023-10-09 DIAGNOSIS — Z87440 Personal history of urinary (tract) infections: Secondary | ICD-10-CM | POA: Diagnosis not present

## 2023-10-09 DIAGNOSIS — G4700 Insomnia, unspecified: Secondary | ICD-10-CM | POA: Diagnosis not present

## 2023-10-09 DIAGNOSIS — Z9181 History of falling: Secondary | ICD-10-CM | POA: Diagnosis not present

## 2023-10-09 DIAGNOSIS — G3184 Mild cognitive impairment, so stated: Secondary | ICD-10-CM | POA: Diagnosis not present

## 2023-10-09 DIAGNOSIS — Z85528 Personal history of other malignant neoplasm of kidney: Secondary | ICD-10-CM | POA: Diagnosis not present

## 2023-10-09 DIAGNOSIS — J309 Allergic rhinitis, unspecified: Secondary | ICD-10-CM | POA: Diagnosis not present

## 2023-10-09 DIAGNOSIS — Z556 Problems related to health literacy: Secondary | ICD-10-CM | POA: Diagnosis not present

## 2023-10-09 DIAGNOSIS — N1831 Chronic kidney disease, stage 3a: Secondary | ICD-10-CM | POA: Diagnosis not present

## 2023-10-15 DIAGNOSIS — E785 Hyperlipidemia, unspecified: Secondary | ICD-10-CM | POA: Diagnosis not present

## 2023-10-15 DIAGNOSIS — E1122 Type 2 diabetes mellitus with diabetic chronic kidney disease: Secondary | ICD-10-CM | POA: Diagnosis not present

## 2023-10-15 DIAGNOSIS — Z7952 Long term (current) use of systemic steroids: Secondary | ICD-10-CM | POA: Diagnosis not present

## 2023-10-15 DIAGNOSIS — D631 Anemia in chronic kidney disease: Secondary | ICD-10-CM | POA: Diagnosis not present

## 2023-10-15 DIAGNOSIS — M069 Rheumatoid arthritis, unspecified: Secondary | ICD-10-CM | POA: Diagnosis not present

## 2023-10-15 DIAGNOSIS — Z7982 Long term (current) use of aspirin: Secondary | ICD-10-CM | POA: Diagnosis not present

## 2023-10-15 DIAGNOSIS — J309 Allergic rhinitis, unspecified: Secondary | ICD-10-CM | POA: Diagnosis not present

## 2023-10-15 DIAGNOSIS — Z556 Problems related to health literacy: Secondary | ICD-10-CM | POA: Diagnosis not present

## 2023-10-15 DIAGNOSIS — E559 Vitamin D deficiency, unspecified: Secondary | ICD-10-CM | POA: Diagnosis not present

## 2023-10-15 DIAGNOSIS — G3184 Mild cognitive impairment, so stated: Secondary | ICD-10-CM | POA: Diagnosis not present

## 2023-10-15 DIAGNOSIS — S81811D Laceration without foreign body, right lower leg, subsequent encounter: Secondary | ICD-10-CM | POA: Diagnosis not present

## 2023-10-15 DIAGNOSIS — G4700 Insomnia, unspecified: Secondary | ICD-10-CM | POA: Diagnosis not present

## 2023-10-15 DIAGNOSIS — Z85528 Personal history of other malignant neoplasm of kidney: Secondary | ICD-10-CM | POA: Diagnosis not present

## 2023-10-15 DIAGNOSIS — E1151 Type 2 diabetes mellitus with diabetic peripheral angiopathy without gangrene: Secondary | ICD-10-CM | POA: Diagnosis not present

## 2023-10-15 DIAGNOSIS — M4800 Spinal stenosis, site unspecified: Secondary | ICD-10-CM | POA: Diagnosis not present

## 2023-10-15 DIAGNOSIS — N1831 Chronic kidney disease, stage 3a: Secondary | ICD-10-CM | POA: Diagnosis not present

## 2023-10-15 DIAGNOSIS — I129 Hypertensive chronic kidney disease with stage 1 through stage 4 chronic kidney disease, or unspecified chronic kidney disease: Secondary | ICD-10-CM | POA: Diagnosis not present

## 2023-10-15 DIAGNOSIS — Z87440 Personal history of urinary (tract) infections: Secondary | ICD-10-CM | POA: Diagnosis not present

## 2023-10-15 DIAGNOSIS — Z9181 History of falling: Secondary | ICD-10-CM | POA: Diagnosis not present

## 2023-10-15 DIAGNOSIS — S91302D Unspecified open wound, left foot, subsequent encounter: Secondary | ICD-10-CM | POA: Diagnosis not present

## 2023-10-19 DIAGNOSIS — M4800 Spinal stenosis, site unspecified: Secondary | ICD-10-CM | POA: Diagnosis not present

## 2023-10-19 DIAGNOSIS — N1831 Chronic kidney disease, stage 3a: Secondary | ICD-10-CM | POA: Diagnosis not present

## 2023-10-19 DIAGNOSIS — Z556 Problems related to health literacy: Secondary | ICD-10-CM | POA: Diagnosis not present

## 2023-10-19 DIAGNOSIS — S91302D Unspecified open wound, left foot, subsequent encounter: Secondary | ICD-10-CM | POA: Diagnosis not present

## 2023-10-19 DIAGNOSIS — M069 Rheumatoid arthritis, unspecified: Secondary | ICD-10-CM | POA: Diagnosis not present

## 2023-10-19 DIAGNOSIS — Z9181 History of falling: Secondary | ICD-10-CM | POA: Diagnosis not present

## 2023-10-19 DIAGNOSIS — Z85528 Personal history of other malignant neoplasm of kidney: Secondary | ICD-10-CM | POA: Diagnosis not present

## 2023-10-19 DIAGNOSIS — I129 Hypertensive chronic kidney disease with stage 1 through stage 4 chronic kidney disease, or unspecified chronic kidney disease: Secondary | ICD-10-CM | POA: Diagnosis not present

## 2023-10-19 DIAGNOSIS — G4700 Insomnia, unspecified: Secondary | ICD-10-CM | POA: Diagnosis not present

## 2023-10-19 DIAGNOSIS — E1122 Type 2 diabetes mellitus with diabetic chronic kidney disease: Secondary | ICD-10-CM | POA: Diagnosis not present

## 2023-10-19 DIAGNOSIS — J309 Allergic rhinitis, unspecified: Secondary | ICD-10-CM | POA: Diagnosis not present

## 2023-10-19 DIAGNOSIS — Z7952 Long term (current) use of systemic steroids: Secondary | ICD-10-CM | POA: Diagnosis not present

## 2023-10-19 DIAGNOSIS — E559 Vitamin D deficiency, unspecified: Secondary | ICD-10-CM | POA: Diagnosis not present

## 2023-10-19 DIAGNOSIS — E1151 Type 2 diabetes mellitus with diabetic peripheral angiopathy without gangrene: Secondary | ICD-10-CM | POA: Diagnosis not present

## 2023-10-19 DIAGNOSIS — Z87440 Personal history of urinary (tract) infections: Secondary | ICD-10-CM | POA: Diagnosis not present

## 2023-10-19 DIAGNOSIS — Z7982 Long term (current) use of aspirin: Secondary | ICD-10-CM | POA: Diagnosis not present

## 2023-10-19 DIAGNOSIS — G3184 Mild cognitive impairment, so stated: Secondary | ICD-10-CM | POA: Diagnosis not present

## 2023-10-19 DIAGNOSIS — D631 Anemia in chronic kidney disease: Secondary | ICD-10-CM | POA: Diagnosis not present

## 2023-10-19 DIAGNOSIS — S81811D Laceration without foreign body, right lower leg, subsequent encounter: Secondary | ICD-10-CM | POA: Diagnosis not present

## 2023-10-19 DIAGNOSIS — E785 Hyperlipidemia, unspecified: Secondary | ICD-10-CM | POA: Diagnosis not present

## 2023-10-21 DIAGNOSIS — G3184 Mild cognitive impairment, so stated: Secondary | ICD-10-CM | POA: Diagnosis not present

## 2023-10-23 DIAGNOSIS — E1151 Type 2 diabetes mellitus with diabetic peripheral angiopathy without gangrene: Secondary | ICD-10-CM | POA: Diagnosis not present

## 2023-10-23 DIAGNOSIS — Z7982 Long term (current) use of aspirin: Secondary | ICD-10-CM | POA: Diagnosis not present

## 2023-10-23 DIAGNOSIS — N1831 Chronic kidney disease, stage 3a: Secondary | ICD-10-CM | POA: Diagnosis not present

## 2023-10-23 DIAGNOSIS — E1122 Type 2 diabetes mellitus with diabetic chronic kidney disease: Secondary | ICD-10-CM | POA: Diagnosis not present

## 2023-10-23 DIAGNOSIS — J309 Allergic rhinitis, unspecified: Secondary | ICD-10-CM | POA: Diagnosis not present

## 2023-10-23 DIAGNOSIS — Z556 Problems related to health literacy: Secondary | ICD-10-CM | POA: Diagnosis not present

## 2023-10-23 DIAGNOSIS — S81811D Laceration without foreign body, right lower leg, subsequent encounter: Secondary | ICD-10-CM | POA: Diagnosis not present

## 2023-10-23 DIAGNOSIS — E559 Vitamin D deficiency, unspecified: Secondary | ICD-10-CM | POA: Diagnosis not present

## 2023-10-23 DIAGNOSIS — S91302D Unspecified open wound, left foot, subsequent encounter: Secondary | ICD-10-CM | POA: Diagnosis not present

## 2023-10-23 DIAGNOSIS — Z85528 Personal history of other malignant neoplasm of kidney: Secondary | ICD-10-CM | POA: Diagnosis not present

## 2023-10-23 DIAGNOSIS — Z7952 Long term (current) use of systemic steroids: Secondary | ICD-10-CM | POA: Diagnosis not present

## 2023-10-23 DIAGNOSIS — D631 Anemia in chronic kidney disease: Secondary | ICD-10-CM | POA: Diagnosis not present

## 2023-10-23 DIAGNOSIS — E785 Hyperlipidemia, unspecified: Secondary | ICD-10-CM | POA: Diagnosis not present

## 2023-10-23 DIAGNOSIS — G3184 Mild cognitive impairment, so stated: Secondary | ICD-10-CM | POA: Diagnosis not present

## 2023-10-23 DIAGNOSIS — Z9181 History of falling: Secondary | ICD-10-CM | POA: Diagnosis not present

## 2023-10-23 DIAGNOSIS — M4800 Spinal stenosis, site unspecified: Secondary | ICD-10-CM | POA: Diagnosis not present

## 2023-10-23 DIAGNOSIS — G4700 Insomnia, unspecified: Secondary | ICD-10-CM | POA: Diagnosis not present

## 2023-10-23 DIAGNOSIS — Z87440 Personal history of urinary (tract) infections: Secondary | ICD-10-CM | POA: Diagnosis not present

## 2023-10-23 DIAGNOSIS — I129 Hypertensive chronic kidney disease with stage 1 through stage 4 chronic kidney disease, or unspecified chronic kidney disease: Secondary | ICD-10-CM | POA: Diagnosis not present

## 2023-10-23 DIAGNOSIS — M069 Rheumatoid arthritis, unspecified: Secondary | ICD-10-CM | POA: Diagnosis not present

## 2023-10-26 DIAGNOSIS — R600 Localized edema: Secondary | ICD-10-CM | POA: Diagnosis not present

## 2023-10-26 DIAGNOSIS — I129 Hypertensive chronic kidney disease with stage 1 through stage 4 chronic kidney disease, or unspecified chronic kidney disease: Secondary | ICD-10-CM | POA: Diagnosis not present

## 2023-10-26 DIAGNOSIS — M069 Rheumatoid arthritis, unspecified: Secondary | ICD-10-CM | POA: Diagnosis not present

## 2023-10-26 DIAGNOSIS — I872 Venous insufficiency (chronic) (peripheral): Secondary | ICD-10-CM | POA: Diagnosis not present

## 2023-10-26 DIAGNOSIS — Z993 Dependence on wheelchair: Secondary | ICD-10-CM | POA: Diagnosis not present

## 2023-10-26 DIAGNOSIS — N1831 Chronic kidney disease, stage 3a: Secondary | ICD-10-CM | POA: Diagnosis not present

## 2023-10-27 DIAGNOSIS — S90931A Unspecified superficial injury of right great toe, initial encounter: Secondary | ICD-10-CM | POA: Diagnosis not present

## 2023-10-29 DIAGNOSIS — Z7952 Long term (current) use of systemic steroids: Secondary | ICD-10-CM | POA: Diagnosis not present

## 2023-10-29 DIAGNOSIS — Z9181 History of falling: Secondary | ICD-10-CM | POA: Diagnosis not present

## 2023-10-29 DIAGNOSIS — E1151 Type 2 diabetes mellitus with diabetic peripheral angiopathy without gangrene: Secondary | ICD-10-CM | POA: Diagnosis not present

## 2023-10-29 DIAGNOSIS — G4700 Insomnia, unspecified: Secondary | ICD-10-CM | POA: Diagnosis not present

## 2023-10-29 DIAGNOSIS — S81811D Laceration without foreign body, right lower leg, subsequent encounter: Secondary | ICD-10-CM | POA: Diagnosis not present

## 2023-10-29 DIAGNOSIS — M069 Rheumatoid arthritis, unspecified: Secondary | ICD-10-CM | POA: Diagnosis not present

## 2023-10-29 DIAGNOSIS — E559 Vitamin D deficiency, unspecified: Secondary | ICD-10-CM | POA: Diagnosis not present

## 2023-10-29 DIAGNOSIS — Z7982 Long term (current) use of aspirin: Secondary | ICD-10-CM | POA: Diagnosis not present

## 2023-10-29 DIAGNOSIS — S91302D Unspecified open wound, left foot, subsequent encounter: Secondary | ICD-10-CM | POA: Diagnosis not present

## 2023-10-29 DIAGNOSIS — E1122 Type 2 diabetes mellitus with diabetic chronic kidney disease: Secondary | ICD-10-CM | POA: Diagnosis not present

## 2023-10-29 DIAGNOSIS — Z85528 Personal history of other malignant neoplasm of kidney: Secondary | ICD-10-CM | POA: Diagnosis not present

## 2023-10-29 DIAGNOSIS — N1831 Chronic kidney disease, stage 3a: Secondary | ICD-10-CM | POA: Diagnosis not present

## 2023-10-29 DIAGNOSIS — E785 Hyperlipidemia, unspecified: Secondary | ICD-10-CM | POA: Diagnosis not present

## 2023-10-29 DIAGNOSIS — D631 Anemia in chronic kidney disease: Secondary | ICD-10-CM | POA: Diagnosis not present

## 2023-10-29 DIAGNOSIS — Z556 Problems related to health literacy: Secondary | ICD-10-CM | POA: Diagnosis not present

## 2023-10-29 DIAGNOSIS — M4800 Spinal stenosis, site unspecified: Secondary | ICD-10-CM | POA: Diagnosis not present

## 2023-10-29 DIAGNOSIS — I129 Hypertensive chronic kidney disease with stage 1 through stage 4 chronic kidney disease, or unspecified chronic kidney disease: Secondary | ICD-10-CM | POA: Diagnosis not present

## 2023-10-29 DIAGNOSIS — Z87440 Personal history of urinary (tract) infections: Secondary | ICD-10-CM | POA: Diagnosis not present

## 2023-10-29 DIAGNOSIS — G3184 Mild cognitive impairment, so stated: Secondary | ICD-10-CM | POA: Diagnosis not present

## 2023-10-29 DIAGNOSIS — J309 Allergic rhinitis, unspecified: Secondary | ICD-10-CM | POA: Diagnosis not present

## 2023-10-30 DIAGNOSIS — Z9181 History of falling: Secondary | ICD-10-CM | POA: Diagnosis not present

## 2023-10-30 DIAGNOSIS — M069 Rheumatoid arthritis, unspecified: Secondary | ICD-10-CM | POA: Diagnosis not present

## 2023-10-30 DIAGNOSIS — J309 Allergic rhinitis, unspecified: Secondary | ICD-10-CM | POA: Diagnosis not present

## 2023-10-30 DIAGNOSIS — E785 Hyperlipidemia, unspecified: Secondary | ICD-10-CM | POA: Diagnosis not present

## 2023-10-30 DIAGNOSIS — E1122 Type 2 diabetes mellitus with diabetic chronic kidney disease: Secondary | ICD-10-CM | POA: Diagnosis not present

## 2023-10-30 DIAGNOSIS — M4800 Spinal stenosis, site unspecified: Secondary | ICD-10-CM | POA: Diagnosis not present

## 2023-10-30 DIAGNOSIS — S91302D Unspecified open wound, left foot, subsequent encounter: Secondary | ICD-10-CM | POA: Diagnosis not present

## 2023-10-30 DIAGNOSIS — Z556 Problems related to health literacy: Secondary | ICD-10-CM | POA: Diagnosis not present

## 2023-10-30 DIAGNOSIS — Z85528 Personal history of other malignant neoplasm of kidney: Secondary | ICD-10-CM | POA: Diagnosis not present

## 2023-10-30 DIAGNOSIS — N1831 Chronic kidney disease, stage 3a: Secondary | ICD-10-CM | POA: Diagnosis not present

## 2023-10-30 DIAGNOSIS — Z7952 Long term (current) use of systemic steroids: Secondary | ICD-10-CM | POA: Diagnosis not present

## 2023-10-30 DIAGNOSIS — G3184 Mild cognitive impairment, so stated: Secondary | ICD-10-CM | POA: Diagnosis not present

## 2023-10-30 DIAGNOSIS — Z7982 Long term (current) use of aspirin: Secondary | ICD-10-CM | POA: Diagnosis not present

## 2023-10-30 DIAGNOSIS — I129 Hypertensive chronic kidney disease with stage 1 through stage 4 chronic kidney disease, or unspecified chronic kidney disease: Secondary | ICD-10-CM | POA: Diagnosis not present

## 2023-10-30 DIAGNOSIS — G4700 Insomnia, unspecified: Secondary | ICD-10-CM | POA: Diagnosis not present

## 2023-10-30 DIAGNOSIS — S81811D Laceration without foreign body, right lower leg, subsequent encounter: Secondary | ICD-10-CM | POA: Diagnosis not present

## 2023-10-30 DIAGNOSIS — E559 Vitamin D deficiency, unspecified: Secondary | ICD-10-CM | POA: Diagnosis not present

## 2023-10-30 DIAGNOSIS — E1151 Type 2 diabetes mellitus with diabetic peripheral angiopathy without gangrene: Secondary | ICD-10-CM | POA: Diagnosis not present

## 2023-10-30 DIAGNOSIS — Z87440 Personal history of urinary (tract) infections: Secondary | ICD-10-CM | POA: Diagnosis not present

## 2023-10-30 DIAGNOSIS — D631 Anemia in chronic kidney disease: Secondary | ICD-10-CM | POA: Diagnosis not present

## 2023-11-02 DIAGNOSIS — G4700 Insomnia, unspecified: Secondary | ICD-10-CM | POA: Diagnosis not present

## 2023-11-02 DIAGNOSIS — I129 Hypertensive chronic kidney disease with stage 1 through stage 4 chronic kidney disease, or unspecified chronic kidney disease: Secondary | ICD-10-CM | POA: Diagnosis not present

## 2023-11-02 DIAGNOSIS — Z7982 Long term (current) use of aspirin: Secondary | ICD-10-CM | POA: Diagnosis not present

## 2023-11-02 DIAGNOSIS — E1122 Type 2 diabetes mellitus with diabetic chronic kidney disease: Secondary | ICD-10-CM | POA: Diagnosis not present

## 2023-11-02 DIAGNOSIS — Z87440 Personal history of urinary (tract) infections: Secondary | ICD-10-CM | POA: Diagnosis not present

## 2023-11-02 DIAGNOSIS — Z556 Problems related to health literacy: Secondary | ICD-10-CM | POA: Diagnosis not present

## 2023-11-02 DIAGNOSIS — E1151 Type 2 diabetes mellitus with diabetic peripheral angiopathy without gangrene: Secondary | ICD-10-CM | POA: Diagnosis not present

## 2023-11-02 DIAGNOSIS — M4800 Spinal stenosis, site unspecified: Secondary | ICD-10-CM | POA: Diagnosis not present

## 2023-11-02 DIAGNOSIS — N1831 Chronic kidney disease, stage 3a: Secondary | ICD-10-CM | POA: Diagnosis not present

## 2023-11-02 DIAGNOSIS — E559 Vitamin D deficiency, unspecified: Secondary | ICD-10-CM | POA: Diagnosis not present

## 2023-11-02 DIAGNOSIS — Z7952 Long term (current) use of systemic steroids: Secondary | ICD-10-CM | POA: Diagnosis not present

## 2023-11-02 DIAGNOSIS — S91302D Unspecified open wound, left foot, subsequent encounter: Secondary | ICD-10-CM | POA: Diagnosis not present

## 2023-11-02 DIAGNOSIS — Z85528 Personal history of other malignant neoplasm of kidney: Secondary | ICD-10-CM | POA: Diagnosis not present

## 2023-11-02 DIAGNOSIS — S81811D Laceration without foreign body, right lower leg, subsequent encounter: Secondary | ICD-10-CM | POA: Diagnosis not present

## 2023-11-02 DIAGNOSIS — D631 Anemia in chronic kidney disease: Secondary | ICD-10-CM | POA: Diagnosis not present

## 2023-11-02 DIAGNOSIS — J309 Allergic rhinitis, unspecified: Secondary | ICD-10-CM | POA: Diagnosis not present

## 2023-11-02 DIAGNOSIS — G3184 Mild cognitive impairment, so stated: Secondary | ICD-10-CM | POA: Diagnosis not present

## 2023-11-02 DIAGNOSIS — Z9181 History of falling: Secondary | ICD-10-CM | POA: Diagnosis not present

## 2023-11-02 DIAGNOSIS — E785 Hyperlipidemia, unspecified: Secondary | ICD-10-CM | POA: Diagnosis not present

## 2023-11-02 DIAGNOSIS — M069 Rheumatoid arthritis, unspecified: Secondary | ICD-10-CM | POA: Diagnosis not present

## 2023-11-06 DIAGNOSIS — S91302D Unspecified open wound, left foot, subsequent encounter: Secondary | ICD-10-CM | POA: Diagnosis not present

## 2023-11-06 DIAGNOSIS — D631 Anemia in chronic kidney disease: Secondary | ICD-10-CM | POA: Diagnosis not present

## 2023-11-06 DIAGNOSIS — Z85528 Personal history of other malignant neoplasm of kidney: Secondary | ICD-10-CM | POA: Diagnosis not present

## 2023-11-06 DIAGNOSIS — J309 Allergic rhinitis, unspecified: Secondary | ICD-10-CM | POA: Diagnosis not present

## 2023-11-06 DIAGNOSIS — Z7952 Long term (current) use of systemic steroids: Secondary | ICD-10-CM | POA: Diagnosis not present

## 2023-11-06 DIAGNOSIS — G3184 Mild cognitive impairment, so stated: Secondary | ICD-10-CM | POA: Diagnosis not present

## 2023-11-06 DIAGNOSIS — Z87440 Personal history of urinary (tract) infections: Secondary | ICD-10-CM | POA: Diagnosis not present

## 2023-11-06 DIAGNOSIS — E1151 Type 2 diabetes mellitus with diabetic peripheral angiopathy without gangrene: Secondary | ICD-10-CM | POA: Diagnosis not present

## 2023-11-06 DIAGNOSIS — M4800 Spinal stenosis, site unspecified: Secondary | ICD-10-CM | POA: Diagnosis not present

## 2023-11-06 DIAGNOSIS — I129 Hypertensive chronic kidney disease with stage 1 through stage 4 chronic kidney disease, or unspecified chronic kidney disease: Secondary | ICD-10-CM | POA: Diagnosis not present

## 2023-11-06 DIAGNOSIS — M069 Rheumatoid arthritis, unspecified: Secondary | ICD-10-CM | POA: Diagnosis not present

## 2023-11-06 DIAGNOSIS — Z7982 Long term (current) use of aspirin: Secondary | ICD-10-CM | POA: Diagnosis not present

## 2023-11-06 DIAGNOSIS — E559 Vitamin D deficiency, unspecified: Secondary | ICD-10-CM | POA: Diagnosis not present

## 2023-11-06 DIAGNOSIS — G4700 Insomnia, unspecified: Secondary | ICD-10-CM | POA: Diagnosis not present

## 2023-11-06 DIAGNOSIS — Z9181 History of falling: Secondary | ICD-10-CM | POA: Diagnosis not present

## 2023-11-06 DIAGNOSIS — E1122 Type 2 diabetes mellitus with diabetic chronic kidney disease: Secondary | ICD-10-CM | POA: Diagnosis not present

## 2023-11-06 DIAGNOSIS — N1831 Chronic kidney disease, stage 3a: Secondary | ICD-10-CM | POA: Diagnosis not present

## 2023-11-06 DIAGNOSIS — Z556 Problems related to health literacy: Secondary | ICD-10-CM | POA: Diagnosis not present

## 2023-11-06 DIAGNOSIS — E785 Hyperlipidemia, unspecified: Secondary | ICD-10-CM | POA: Diagnosis not present

## 2023-11-06 DIAGNOSIS — S81811D Laceration without foreign body, right lower leg, subsequent encounter: Secondary | ICD-10-CM | POA: Diagnosis not present

## 2023-11-10 DIAGNOSIS — M069 Rheumatoid arthritis, unspecified: Secondary | ICD-10-CM | POA: Diagnosis not present

## 2023-11-10 DIAGNOSIS — I129 Hypertensive chronic kidney disease with stage 1 through stage 4 chronic kidney disease, or unspecified chronic kidney disease: Secondary | ICD-10-CM | POA: Diagnosis not present

## 2023-11-10 DIAGNOSIS — D631 Anemia in chronic kidney disease: Secondary | ICD-10-CM | POA: Diagnosis not present

## 2023-11-10 DIAGNOSIS — S91302D Unspecified open wound, left foot, subsequent encounter: Secondary | ICD-10-CM | POA: Diagnosis not present

## 2023-11-10 DIAGNOSIS — S81811D Laceration without foreign body, right lower leg, subsequent encounter: Secondary | ICD-10-CM | POA: Diagnosis not present

## 2023-11-10 DIAGNOSIS — M79675 Pain in left toe(s): Secondary | ICD-10-CM | POA: Diagnosis not present

## 2023-11-10 DIAGNOSIS — M4800 Spinal stenosis, site unspecified: Secondary | ICD-10-CM | POA: Diagnosis not present

## 2023-11-10 DIAGNOSIS — B351 Tinea unguium: Secondary | ICD-10-CM | POA: Diagnosis not present

## 2023-11-10 DIAGNOSIS — Z9181 History of falling: Secondary | ICD-10-CM | POA: Diagnosis not present

## 2023-11-10 DIAGNOSIS — J309 Allergic rhinitis, unspecified: Secondary | ICD-10-CM | POA: Diagnosis not present

## 2023-11-10 DIAGNOSIS — E1122 Type 2 diabetes mellitus with diabetic chronic kidney disease: Secondary | ICD-10-CM | POA: Diagnosis not present

## 2023-11-10 DIAGNOSIS — Z87440 Personal history of urinary (tract) infections: Secondary | ICD-10-CM | POA: Diagnosis not present

## 2023-11-10 DIAGNOSIS — M2041 Other hammer toe(s) (acquired), right foot: Secondary | ICD-10-CM | POA: Diagnosis not present

## 2023-11-10 DIAGNOSIS — N189 Chronic kidney disease, unspecified: Secondary | ICD-10-CM | POA: Diagnosis not present

## 2023-11-10 DIAGNOSIS — N1831 Chronic kidney disease, stage 3a: Secondary | ICD-10-CM | POA: Diagnosis not present

## 2023-11-10 DIAGNOSIS — E1151 Type 2 diabetes mellitus with diabetic peripheral angiopathy without gangrene: Secondary | ICD-10-CM | POA: Diagnosis not present

## 2023-11-10 DIAGNOSIS — G3184 Mild cognitive impairment, so stated: Secondary | ICD-10-CM | POA: Diagnosis not present

## 2023-11-10 DIAGNOSIS — Z7982 Long term (current) use of aspirin: Secondary | ICD-10-CM | POA: Diagnosis not present

## 2023-11-10 DIAGNOSIS — Z7952 Long term (current) use of systemic steroids: Secondary | ICD-10-CM | POA: Diagnosis not present

## 2023-11-10 DIAGNOSIS — Z85528 Personal history of other malignant neoplasm of kidney: Secondary | ICD-10-CM | POA: Diagnosis not present

## 2023-11-10 DIAGNOSIS — E785 Hyperlipidemia, unspecified: Secondary | ICD-10-CM | POA: Diagnosis not present

## 2023-11-10 DIAGNOSIS — Z556 Problems related to health literacy: Secondary | ICD-10-CM | POA: Diagnosis not present

## 2023-11-10 DIAGNOSIS — G4700 Insomnia, unspecified: Secondary | ICD-10-CM | POA: Diagnosis not present

## 2023-11-10 DIAGNOSIS — E559 Vitamin D deficiency, unspecified: Secondary | ICD-10-CM | POA: Diagnosis not present

## 2023-11-13 DIAGNOSIS — Z7952 Long term (current) use of systemic steroids: Secondary | ICD-10-CM | POA: Diagnosis not present

## 2023-11-13 DIAGNOSIS — S91302D Unspecified open wound, left foot, subsequent encounter: Secondary | ICD-10-CM | POA: Diagnosis not present

## 2023-11-13 DIAGNOSIS — M4800 Spinal stenosis, site unspecified: Secondary | ICD-10-CM | POA: Diagnosis not present

## 2023-11-13 DIAGNOSIS — Z85528 Personal history of other malignant neoplasm of kidney: Secondary | ICD-10-CM | POA: Diagnosis not present

## 2023-11-13 DIAGNOSIS — Z556 Problems related to health literacy: Secondary | ICD-10-CM | POA: Diagnosis not present

## 2023-11-13 DIAGNOSIS — M069 Rheumatoid arthritis, unspecified: Secondary | ICD-10-CM | POA: Diagnosis not present

## 2023-11-13 DIAGNOSIS — Z87440 Personal history of urinary (tract) infections: Secondary | ICD-10-CM | POA: Diagnosis not present

## 2023-11-13 DIAGNOSIS — G4700 Insomnia, unspecified: Secondary | ICD-10-CM | POA: Diagnosis not present

## 2023-11-13 DIAGNOSIS — J309 Allergic rhinitis, unspecified: Secondary | ICD-10-CM | POA: Diagnosis not present

## 2023-11-13 DIAGNOSIS — S81811D Laceration without foreign body, right lower leg, subsequent encounter: Secondary | ICD-10-CM | POA: Diagnosis not present

## 2023-11-13 DIAGNOSIS — D631 Anemia in chronic kidney disease: Secondary | ICD-10-CM | POA: Diagnosis not present

## 2023-11-13 DIAGNOSIS — Z7982 Long term (current) use of aspirin: Secondary | ICD-10-CM | POA: Diagnosis not present

## 2023-11-13 DIAGNOSIS — G3184 Mild cognitive impairment, so stated: Secondary | ICD-10-CM | POA: Diagnosis not present

## 2023-11-13 DIAGNOSIS — E1151 Type 2 diabetes mellitus with diabetic peripheral angiopathy without gangrene: Secondary | ICD-10-CM | POA: Diagnosis not present

## 2023-11-13 DIAGNOSIS — I129 Hypertensive chronic kidney disease with stage 1 through stage 4 chronic kidney disease, or unspecified chronic kidney disease: Secondary | ICD-10-CM | POA: Diagnosis not present

## 2023-11-13 DIAGNOSIS — E1122 Type 2 diabetes mellitus with diabetic chronic kidney disease: Secondary | ICD-10-CM | POA: Diagnosis not present

## 2023-11-13 DIAGNOSIS — N1831 Chronic kidney disease, stage 3a: Secondary | ICD-10-CM | POA: Diagnosis not present

## 2023-11-13 DIAGNOSIS — E785 Hyperlipidemia, unspecified: Secondary | ICD-10-CM | POA: Diagnosis not present

## 2023-11-13 DIAGNOSIS — Z9181 History of falling: Secondary | ICD-10-CM | POA: Diagnosis not present

## 2023-11-13 DIAGNOSIS — E559 Vitamin D deficiency, unspecified: Secondary | ICD-10-CM | POA: Diagnosis not present

## 2023-11-17 DIAGNOSIS — J309 Allergic rhinitis, unspecified: Secondary | ICD-10-CM | POA: Diagnosis not present

## 2023-11-17 DIAGNOSIS — E785 Hyperlipidemia, unspecified: Secondary | ICD-10-CM | POA: Diagnosis not present

## 2023-11-17 DIAGNOSIS — Z7952 Long term (current) use of systemic steroids: Secondary | ICD-10-CM | POA: Diagnosis not present

## 2023-11-17 DIAGNOSIS — M069 Rheumatoid arthritis, unspecified: Secondary | ICD-10-CM | POA: Diagnosis not present

## 2023-11-17 DIAGNOSIS — N1831 Chronic kidney disease, stage 3a: Secondary | ICD-10-CM | POA: Diagnosis not present

## 2023-11-17 DIAGNOSIS — Z556 Problems related to health literacy: Secondary | ICD-10-CM | POA: Diagnosis not present

## 2023-11-17 DIAGNOSIS — Z7982 Long term (current) use of aspirin: Secondary | ICD-10-CM | POA: Diagnosis not present

## 2023-11-17 DIAGNOSIS — D631 Anemia in chronic kidney disease: Secondary | ICD-10-CM | POA: Diagnosis not present

## 2023-11-17 DIAGNOSIS — E559 Vitamin D deficiency, unspecified: Secondary | ICD-10-CM | POA: Diagnosis not present

## 2023-11-17 DIAGNOSIS — I129 Hypertensive chronic kidney disease with stage 1 through stage 4 chronic kidney disease, or unspecified chronic kidney disease: Secondary | ICD-10-CM | POA: Diagnosis not present

## 2023-11-17 DIAGNOSIS — Z87440 Personal history of urinary (tract) infections: Secondary | ICD-10-CM | POA: Diagnosis not present

## 2023-11-17 DIAGNOSIS — Z85528 Personal history of other malignant neoplasm of kidney: Secondary | ICD-10-CM | POA: Diagnosis not present

## 2023-11-17 DIAGNOSIS — E1151 Type 2 diabetes mellitus with diabetic peripheral angiopathy without gangrene: Secondary | ICD-10-CM | POA: Diagnosis not present

## 2023-11-17 DIAGNOSIS — G3184 Mild cognitive impairment, so stated: Secondary | ICD-10-CM | POA: Diagnosis not present

## 2023-11-17 DIAGNOSIS — M4800 Spinal stenosis, site unspecified: Secondary | ICD-10-CM | POA: Diagnosis not present

## 2023-11-17 DIAGNOSIS — E1122 Type 2 diabetes mellitus with diabetic chronic kidney disease: Secondary | ICD-10-CM | POA: Diagnosis not present

## 2023-11-17 DIAGNOSIS — S81811D Laceration without foreign body, right lower leg, subsequent encounter: Secondary | ICD-10-CM | POA: Diagnosis not present

## 2023-11-17 DIAGNOSIS — S91302D Unspecified open wound, left foot, subsequent encounter: Secondary | ICD-10-CM | POA: Diagnosis not present

## 2023-11-17 DIAGNOSIS — Z9181 History of falling: Secondary | ICD-10-CM | POA: Diagnosis not present

## 2023-11-17 DIAGNOSIS — G4700 Insomnia, unspecified: Secondary | ICD-10-CM | POA: Diagnosis not present

## 2023-11-18 DIAGNOSIS — G4700 Insomnia, unspecified: Secondary | ICD-10-CM | POA: Diagnosis not present

## 2023-11-18 DIAGNOSIS — I129 Hypertensive chronic kidney disease with stage 1 through stage 4 chronic kidney disease, or unspecified chronic kidney disease: Secondary | ICD-10-CM | POA: Diagnosis not present

## 2023-11-18 DIAGNOSIS — M5090 Cervical disc disorder, unspecified, unspecified cervical region: Secondary | ICD-10-CM | POA: Diagnosis not present

## 2023-11-20 DIAGNOSIS — G3184 Mild cognitive impairment, so stated: Secondary | ICD-10-CM | POA: Diagnosis not present

## 2023-11-23 DIAGNOSIS — J309 Allergic rhinitis, unspecified: Secondary | ICD-10-CM | POA: Diagnosis not present

## 2023-11-23 DIAGNOSIS — E785 Hyperlipidemia, unspecified: Secondary | ICD-10-CM | POA: Diagnosis not present

## 2023-11-23 DIAGNOSIS — E1151 Type 2 diabetes mellitus with diabetic peripheral angiopathy without gangrene: Secondary | ICD-10-CM | POA: Diagnosis not present

## 2023-11-23 DIAGNOSIS — Z556 Problems related to health literacy: Secondary | ICD-10-CM | POA: Diagnosis not present

## 2023-11-23 DIAGNOSIS — S81811D Laceration without foreign body, right lower leg, subsequent encounter: Secondary | ICD-10-CM | POA: Diagnosis not present

## 2023-11-23 DIAGNOSIS — Z87440 Personal history of urinary (tract) infections: Secondary | ICD-10-CM | POA: Diagnosis not present

## 2023-11-23 DIAGNOSIS — M069 Rheumatoid arthritis, unspecified: Secondary | ICD-10-CM | POA: Diagnosis not present

## 2023-11-23 DIAGNOSIS — Z7952 Long term (current) use of systemic steroids: Secondary | ICD-10-CM | POA: Diagnosis not present

## 2023-11-23 DIAGNOSIS — Z85528 Personal history of other malignant neoplasm of kidney: Secondary | ICD-10-CM | POA: Diagnosis not present

## 2023-11-23 DIAGNOSIS — Z7982 Long term (current) use of aspirin: Secondary | ICD-10-CM | POA: Diagnosis not present

## 2023-11-23 DIAGNOSIS — G4700 Insomnia, unspecified: Secondary | ICD-10-CM | POA: Diagnosis not present

## 2023-11-23 DIAGNOSIS — E1122 Type 2 diabetes mellitus with diabetic chronic kidney disease: Secondary | ICD-10-CM | POA: Diagnosis not present

## 2023-11-23 DIAGNOSIS — D631 Anemia in chronic kidney disease: Secondary | ICD-10-CM | POA: Diagnosis not present

## 2023-11-23 DIAGNOSIS — E559 Vitamin D deficiency, unspecified: Secondary | ICD-10-CM | POA: Diagnosis not present

## 2023-11-23 DIAGNOSIS — N1831 Chronic kidney disease, stage 3a: Secondary | ICD-10-CM | POA: Diagnosis not present

## 2023-11-23 DIAGNOSIS — S91302D Unspecified open wound, left foot, subsequent encounter: Secondary | ICD-10-CM | POA: Diagnosis not present

## 2023-11-23 DIAGNOSIS — Z9181 History of falling: Secondary | ICD-10-CM | POA: Diagnosis not present

## 2023-11-23 DIAGNOSIS — G3184 Mild cognitive impairment, so stated: Secondary | ICD-10-CM | POA: Diagnosis not present

## 2023-11-23 DIAGNOSIS — I129 Hypertensive chronic kidney disease with stage 1 through stage 4 chronic kidney disease, or unspecified chronic kidney disease: Secondary | ICD-10-CM | POA: Diagnosis not present

## 2023-11-23 DIAGNOSIS — M4800 Spinal stenosis, site unspecified: Secondary | ICD-10-CM | POA: Diagnosis not present

## 2023-11-26 DIAGNOSIS — S91302D Unspecified open wound, left foot, subsequent encounter: Secondary | ICD-10-CM | POA: Diagnosis not present

## 2023-11-26 DIAGNOSIS — S81811D Laceration without foreign body, right lower leg, subsequent encounter: Secondary | ICD-10-CM | POA: Diagnosis not present

## 2023-11-26 DIAGNOSIS — E1151 Type 2 diabetes mellitus with diabetic peripheral angiopathy without gangrene: Secondary | ICD-10-CM | POA: Diagnosis not present

## 2023-12-02 DIAGNOSIS — E1151 Type 2 diabetes mellitus with diabetic peripheral angiopathy without gangrene: Secondary | ICD-10-CM | POA: Diagnosis not present

## 2023-12-04 DIAGNOSIS — G3184 Mild cognitive impairment, so stated: Secondary | ICD-10-CM | POA: Diagnosis not present

## 2023-12-16 DIAGNOSIS — G4700 Insomnia, unspecified: Secondary | ICD-10-CM | POA: Diagnosis not present

## 2023-12-16 DIAGNOSIS — I129 Hypertensive chronic kidney disease with stage 1 through stage 4 chronic kidney disease, or unspecified chronic kidney disease: Secondary | ICD-10-CM | POA: Diagnosis not present

## 2023-12-16 DIAGNOSIS — G894 Chronic pain syndrome: Secondary | ICD-10-CM | POA: Diagnosis not present

## 2023-12-18 DIAGNOSIS — G3184 Mild cognitive impairment, so stated: Secondary | ICD-10-CM | POA: Diagnosis not present

## 2023-12-31 DIAGNOSIS — M069 Rheumatoid arthritis, unspecified: Secondary | ICD-10-CM | POA: Diagnosis not present

## 2024-01-01 DIAGNOSIS — G3184 Mild cognitive impairment, so stated: Secondary | ICD-10-CM | POA: Diagnosis not present

## 2024-01-07 DIAGNOSIS — S00201A Unspecified superficial injury of right eyelid and periocular area, initial encounter: Secondary | ICD-10-CM | POA: Diagnosis not present

## 2024-01-13 DIAGNOSIS — G4700 Insomnia, unspecified: Secondary | ICD-10-CM | POA: Diagnosis not present

## 2024-01-13 DIAGNOSIS — K5900 Constipation, unspecified: Secondary | ICD-10-CM | POA: Diagnosis not present

## 2024-01-13 DIAGNOSIS — I872 Venous insufficiency (chronic) (peripheral): Secondary | ICD-10-CM | POA: Diagnosis not present

## 2024-01-25 DIAGNOSIS — I872 Venous insufficiency (chronic) (peripheral): Secondary | ICD-10-CM | POA: Diagnosis not present

## 2024-01-25 DIAGNOSIS — Z993 Dependence on wheelchair: Secondary | ICD-10-CM | POA: Diagnosis not present

## 2024-01-25 DIAGNOSIS — M069 Rheumatoid arthritis, unspecified: Secondary | ICD-10-CM | POA: Diagnosis not present

## 2024-01-27 ENCOUNTER — Emergency Department (HOSPITAL_COMMUNITY)

## 2024-01-27 ENCOUNTER — Other Ambulatory Visit: Payer: Self-pay

## 2024-01-27 ENCOUNTER — Encounter (HOSPITAL_COMMUNITY): Payer: Self-pay | Admitting: Family Medicine

## 2024-01-27 ENCOUNTER — Emergency Department (HOSPITAL_COMMUNITY)
Admission: EM | Admit: 2024-01-27 | Discharge: 2024-01-27 | Disposition: A | Discharge status: Skilled Nursing Facility | Source: Home / Self Care | Attending: Emergency Medicine | Admitting: Emergency Medicine

## 2024-01-27 ENCOUNTER — Observation Stay (HOSPITAL_COMMUNITY)
Admission: EM | Admit: 2024-01-27 | Discharge: 2024-02-01 | Disposition: A | Discharge status: Home / Self Care | Source: Skilled Nursing Facility | Attending: Emergency Medicine | Admitting: Emergency Medicine

## 2024-01-27 DIAGNOSIS — N1831 Chronic kidney disease, stage 3a: Secondary | ICD-10-CM

## 2024-01-27 DIAGNOSIS — J45909 Unspecified asthma, uncomplicated: Secondary | ICD-10-CM | POA: Diagnosis not present

## 2024-01-27 DIAGNOSIS — I129 Hypertensive chronic kidney disease with stage 1 through stage 4 chronic kidney disease, or unspecified chronic kidney disease: Secondary | ICD-10-CM | POA: Diagnosis not present

## 2024-01-27 DIAGNOSIS — R0989 Other specified symptoms and signs involving the circulatory and respiratory systems: Secondary | ICD-10-CM | POA: Diagnosis not present

## 2024-01-27 DIAGNOSIS — R4182 Altered mental status, unspecified: Secondary | ICD-10-CM | POA: Insufficient documentation

## 2024-01-27 DIAGNOSIS — R944 Abnormal results of kidney function studies: Secondary | ICD-10-CM | POA: Insufficient documentation

## 2024-01-27 DIAGNOSIS — Z79899 Other long term (current) drug therapy: Secondary | ICD-10-CM | POA: Diagnosis not present

## 2024-01-27 DIAGNOSIS — L03115 Cellulitis of right lower limb: Secondary | ICD-10-CM | POA: Insufficient documentation

## 2024-01-27 DIAGNOSIS — I87303 Chronic venous hypertension (idiopathic) without complications of bilateral lower extremity: Secondary | ICD-10-CM | POA: Diagnosis not present

## 2024-01-27 DIAGNOSIS — E119 Type 2 diabetes mellitus without complications: Secondary | ICD-10-CM | POA: Insufficient documentation

## 2024-01-27 DIAGNOSIS — R404 Transient alteration of awareness: Secondary | ICD-10-CM | POA: Diagnosis not present

## 2024-01-27 DIAGNOSIS — N183 Chronic kidney disease, stage 3 unspecified: Secondary | ICD-10-CM | POA: Diagnosis present

## 2024-01-27 DIAGNOSIS — F32A Depression, unspecified: Secondary | ICD-10-CM | POA: Diagnosis not present

## 2024-01-27 DIAGNOSIS — E1122 Type 2 diabetes mellitus with diabetic chronic kidney disease: Secondary | ICD-10-CM | POA: Diagnosis not present

## 2024-01-27 DIAGNOSIS — R600 Localized edema: Secondary | ICD-10-CM | POA: Diagnosis not present

## 2024-01-27 DIAGNOSIS — R0902 Hypoxemia: Principal | ICD-10-CM

## 2024-01-27 DIAGNOSIS — L03116 Cellulitis of left lower limb: Secondary | ICD-10-CM | POA: Insufficient documentation

## 2024-01-27 DIAGNOSIS — F419 Anxiety disorder, unspecified: Secondary | ICD-10-CM | POA: Diagnosis not present

## 2024-01-27 DIAGNOSIS — I10 Essential (primary) hypertension: Secondary | ICD-10-CM | POA: Diagnosis not present

## 2024-01-27 DIAGNOSIS — G3184 Mild cognitive impairment, so stated: Secondary | ICD-10-CM | POA: Diagnosis not present

## 2024-01-27 DIAGNOSIS — R509 Fever, unspecified: Secondary | ICD-10-CM | POA: Insufficient documentation

## 2024-01-27 DIAGNOSIS — R9082 White matter disease, unspecified: Secondary | ICD-10-CM | POA: Diagnosis not present

## 2024-01-27 DIAGNOSIS — Z7982 Long term (current) use of aspirin: Secondary | ICD-10-CM | POA: Insufficient documentation

## 2024-01-27 DIAGNOSIS — I878 Other specified disorders of veins: Secondary | ICD-10-CM

## 2024-01-27 DIAGNOSIS — Z743 Need for continuous supervision: Secondary | ICD-10-CM | POA: Diagnosis not present

## 2024-01-27 DIAGNOSIS — Z7401 Bed confinement status: Secondary | ICD-10-CM | POA: Diagnosis not present

## 2024-01-27 DIAGNOSIS — L03119 Cellulitis of unspecified part of limb: Secondary | ICD-10-CM

## 2024-01-27 DIAGNOSIS — R0600 Dyspnea, unspecified: Secondary | ICD-10-CM | POA: Diagnosis not present

## 2024-01-27 DIAGNOSIS — M79605 Pain in left leg: Secondary | ICD-10-CM | POA: Diagnosis not present

## 2024-01-27 DIAGNOSIS — M069 Rheumatoid arthritis, unspecified: Secondary | ICD-10-CM | POA: Diagnosis not present

## 2024-01-27 DIAGNOSIS — R0609 Other forms of dyspnea: Secondary | ICD-10-CM

## 2024-01-27 DIAGNOSIS — R6889 Other general symptoms and signs: Secondary | ICD-10-CM | POA: Diagnosis not present

## 2024-01-27 DIAGNOSIS — D649 Anemia, unspecified: Secondary | ICD-10-CM | POA: Diagnosis not present

## 2024-01-27 DIAGNOSIS — R410 Disorientation, unspecified: Secondary | ICD-10-CM | POA: Diagnosis not present

## 2024-01-27 DIAGNOSIS — R0602 Shortness of breath: Secondary | ICD-10-CM | POA: Diagnosis not present

## 2024-01-27 DIAGNOSIS — R0689 Other abnormalities of breathing: Secondary | ICD-10-CM | POA: Diagnosis not present

## 2024-01-27 DIAGNOSIS — J984 Other disorders of lung: Secondary | ICD-10-CM | POA: Diagnosis not present

## 2024-01-27 DIAGNOSIS — M79604 Pain in right leg: Secondary | ICD-10-CM | POA: Diagnosis not present

## 2024-01-27 LAB — COMPREHENSIVE METABOLIC PANEL WITH GFR
ALT: 39 U/L (ref 0–44)
AST: 47 U/L — ABNORMAL HIGH (ref 15–41)
Albumin: 3.4 g/dL — ABNORMAL LOW (ref 3.5–5.0)
Alkaline Phosphatase: 113 U/L (ref 38–126)
Anion gap: 11 (ref 5–15)
BUN: 21 mg/dL (ref 8–23)
CO2: 24 mmol/L (ref 22–32)
Calcium: 8.2 mg/dL — ABNORMAL LOW (ref 8.9–10.3)
Chloride: 107 mmol/L (ref 98–111)
Creatinine, Ser: 1.34 mg/dL — ABNORMAL HIGH (ref 0.44–1.00)
GFR, Estimated: 41 mL/min — ABNORMAL LOW (ref >60)
Glucose, Bld: 101 mg/dL — ABNORMAL HIGH (ref 70–99)
Potassium: 4.3 mmol/L (ref 3.5–5.1)
Sodium: 142 mmol/L (ref 135–145)
Total Bilirubin: 0.8 mg/dL (ref 0.0–1.2)
Total Protein: 6.9 g/dL (ref 6.5–8.1)

## 2024-01-27 LAB — CBC
HCT: 33 % — ABNORMAL LOW (ref 36.0–46.0)
Hemoglobin: 10.2 g/dL — ABNORMAL LOW (ref 12.0–15.0)
MCH: 29.5 pg (ref 26.0–34.0)
MCHC: 30.9 g/dL (ref 30.0–36.0)
MCV: 95.4 fL (ref 80.0–100.0)
Platelets: 198 K/uL (ref 150–400)
RBC: 3.46 MIL/uL — ABNORMAL LOW (ref 3.87–5.11)
RDW: 14.7 % (ref 11.5–15.5)
WBC: 8.7 K/uL (ref 4.0–10.5)
nRBC: 0 % (ref 0.0–0.2)

## 2024-01-27 LAB — URINALYSIS, ROUTINE W REFLEX MICROSCOPIC
Bilirubin Urine: NEGATIVE
Glucose, UA: NEGATIVE mg/dL
Hgb urine dipstick: NEGATIVE
Ketones, ur: NEGATIVE mg/dL
Leukocytes,Ua: NEGATIVE
Nitrite: NEGATIVE
Protein, ur: NEGATIVE mg/dL
Specific Gravity, Urine: 1.003 — ABNORMAL LOW (ref 1.005–1.030)
pH: 5 (ref 5.0–8.0)

## 2024-01-27 LAB — TROPONIN I (HIGH SENSITIVITY): Troponin I (High Sensitivity): 13 ng/L (ref <18)

## 2024-01-27 LAB — RESP PANEL BY RT-PCR (RSV, FLU A&B, COVID)  RVPGX2
Influenza A by PCR: NEGATIVE
Influenza B by PCR: NEGATIVE
Resp Syncytial Virus by PCR: NEGATIVE
SARS Coronavirus 2 by RT PCR: NEGATIVE

## 2024-01-27 LAB — BRAIN NATRIURETIC PEPTIDE: B Natriuretic Peptide: 122.7 pg/mL — ABNORMAL HIGH (ref 0.0–100.0)

## 2024-01-27 MED ORDER — ONDANSETRON HCL 4 MG PO TABS: 4.0000 mg | ORAL_TABLET | Freq: Four times a day (QID) | ORAL | Status: DC | PRN

## 2024-01-27 MED ORDER — ACETAMINOPHEN 500 MG PO TABS
1000.0000 mg | ORAL_TABLET | Freq: Once | ORAL | Status: AC
  Administered 2024-01-27: 1000 mg via ORAL
  Filled 2024-01-27: qty 2

## 2024-01-27 MED ORDER — OLANZAPINE 5 MG PO TABS
5.0000 mg | ORAL_TABLET | Freq: Every day | ORAL | Status: DC
  Administered 2024-01-28 – 2024-02-01 (×5): 5 mg via ORAL
  Filled 2024-01-27 (×5): qty 1

## 2024-01-27 MED ORDER — ACETAMINOPHEN 650 MG RE SUPP: 650.0000 mg | Freq: Four times a day (QID) | RECTAL | Status: DC | PRN

## 2024-01-27 MED ORDER — ENOXAPARIN SODIUM 40 MG/0.4ML IJ SOSY
40.0000 mg | PREFILLED_SYRINGE | INTRAMUSCULAR | Status: DC
  Administered 2024-01-28 – 2024-02-01 (×5): 40 mg via SUBCUTANEOUS
  Filled 2024-01-27 (×5): qty 0.4

## 2024-01-27 MED ORDER — ONDANSETRON HCL 4 MG/2ML IJ SOLN: 4.0000 mg | Freq: Four times a day (QID) | INTRAMUSCULAR | Status: DC | PRN

## 2024-01-27 MED ORDER — ALPRAZOLAM 0.25 MG PO TABS
0.2500 mg | ORAL_TABLET | Freq: Two times a day (BID) | ORAL | Status: DC | PRN
  Administered 2024-01-28 – 2024-01-30 (×2): 0.25 mg via ORAL
  Filled 2024-01-27 (×3): qty 1

## 2024-01-27 MED ORDER — SODIUM CHLORIDE 0.9 % IV SOLN
2.0000 g | Freq: Once | INTRAVENOUS | Status: AC
  Administered 2024-01-27: 2 g via INTRAVENOUS
  Filled 2024-01-27: qty 20

## 2024-01-27 MED ORDER — CEPHALEXIN 500 MG PO CAPS: 500.0000 mg | ORAL_CAPSULE | Freq: Four times a day (QID) | ORAL | 0 refills | Status: DC

## 2024-01-27 MED ORDER — PREDNISONE 5 MG PO TABS
5.0000 mg | ORAL_TABLET | Freq: Every day | ORAL | Status: DC
  Administered 2024-01-28 – 2024-02-01 (×5): 5 mg via ORAL
  Filled 2024-01-27 (×5): qty 1

## 2024-01-27 MED ORDER — SENNOSIDES-DOCUSATE SODIUM 8.6-50 MG PO TABS: 1.0000 | ORAL_TABLET | Freq: Every evening | ORAL | Status: DC | PRN

## 2024-01-27 MED ORDER — HYDROXYZINE PAMOATE 50 MG PO CAPS: 50.0000 mg | ORAL_CAPSULE | Freq: Two times a day (BID) | ORAL | Status: DC

## 2024-01-27 MED ORDER — METOPROLOL SUCCINATE ER 25 MG PO TB24
25.0000 mg | ORAL_TABLET | Freq: Every day | ORAL | Status: DC
Start: 2024-01-28 — End: 2024-02-01
  Administered 2024-01-28 – 2024-02-01 (×5): 25 mg via ORAL
  Filled 2024-01-27 (×5): qty 1

## 2024-01-27 MED ORDER — CEPHALEXIN 500 MG PO CAPS
500.0000 mg | ORAL_CAPSULE | Freq: Four times a day (QID) | ORAL | Status: DC
  Administered 2024-01-28 – 2024-02-01 (×18): 500 mg via ORAL
  Filled 2024-01-27 (×18): qty 1

## 2024-01-27 MED ORDER — MELATONIN 5 MG PO TABS
5.0000 mg | ORAL_TABLET | Freq: Every day | ORAL | Status: DC
  Administered 2024-01-28 – 2024-01-31 (×5): 5 mg via ORAL
  Filled 2024-01-27 (×5): qty 1

## 2024-01-27 MED ORDER — SODIUM CHLORIDE 0.9% FLUSH
3.0000 mL | Freq: Two times a day (BID) | INTRAVENOUS | Status: DC
  Administered 2024-01-28 – 2024-02-01 (×9): 3 mL via INTRAVENOUS

## 2024-01-27 MED ORDER — ACETAMINOPHEN 325 MG PO TABS
650.0000 mg | ORAL_TABLET | Freq: Four times a day (QID) | ORAL | Status: DC | PRN
  Administered 2024-01-30: 650 mg via ORAL
  Filled 2024-01-27: qty 2

## 2024-01-27 MED ORDER — BENZTROPINE MESYLATE 0.5 MG PO TABS
0.5000 mg | ORAL_TABLET | Freq: Two times a day (BID) | ORAL | Status: DC
  Administered 2024-01-28 – 2024-02-01 (×9): 0.5 mg via ORAL
  Filled 2024-01-27 (×9): qty 1

## 2024-01-27 MED ORDER — FUROSEMIDE 20 MG PO TABS
20.0000 mg | ORAL_TABLET | Freq: Every day | ORAL | Status: DC
  Administered 2024-01-28 – 2024-02-01 (×5): 20 mg via ORAL
  Filled 2024-01-27 (×5): qty 1

## 2024-01-27 MED ORDER — ESCITALOPRAM OXALATE 10 MG PO TABS
15.0000 mg | ORAL_TABLET | Freq: Every day | ORAL | Status: DC
  Administered 2024-01-28 – 2024-02-01 (×5): 15 mg via ORAL
  Filled 2024-01-27 (×5): qty 2

## 2024-01-27 MED ORDER — HYDROXYZINE HCL 50 MG PO TABS
50.0000 mg | ORAL_TABLET | Freq: Two times a day (BID) | ORAL | Status: DC
  Administered 2024-01-28 – 2024-02-01 (×9): 50 mg via ORAL
  Filled 2024-01-27 (×9): qty 1

## 2024-01-27 MED ORDER — FUROSEMIDE 10 MG/ML IJ SOLN
20.0000 mg | Freq: Once | INTRAMUSCULAR | Status: AC
  Administered 2024-01-27: 20 mg via INTRAVENOUS
  Filled 2024-01-27: qty 4

## 2024-01-27 NOTE — Discharge Instructions (Addendum)
 Follow-up with primary care for echo.  Increase your Lasix  due to swelling of lower extremity.  Follow-up with wound care and trial antibiotic for lower extremity rash. Return to ER with worsening symptoms.

## 2024-01-27 NOTE — Hospital Course (Signed)
 [name] is a 78 y.o. female with medical history significant for HTN, CKD stage IIIa, RA on chronic prednisone , chronic anemia, chronic bilateral lower extremity edema with venous stasis, depression/anxiety, mild cognitive impairment who is admitted with fever, concern for ambulatory hypoxia.

## 2024-01-27 NOTE — ED Notes (Signed)
 RN called [facility] to have patient transported back to [facility] at [facility]. RN called facility to give report nurse unavailable at this time.

## 2024-01-27 NOTE — H&P (Signed)
 History and Physical    [name] FMW:[id number] DOB: 06-04-1946 DOA: 01/27/2024  PCP: Loretha Richerd SAUNDERS, MD  [facility] from: [facility]  I have personally briefly reviewed patient's old medical records in [facility]  Chief Complaint: Hypoxia  HPI: [name] is a 78 y.o. female with medical history significant for HTN, CKD stage IIIa, RA on chronic prednisone , chronic anemia, chronic bilateral lower extremity edema with venous stasis, depression/anxiety, mild cognitive impairment who presented to the ED from [facility] for evaluation of hypoxia.  Patient seen in the ED earlier today for evaluation of dyspnea and hypoxia as documented below.  She was discharged back to SNF.  Per report she was again noted to be hypoxic at her SNF with SpO2 84% on room air while at rest and 78% on room air with ambulation.  She was sent back to the ED.  Patient herself states that she has not really had much shortness of breath.  She reports chronic intermittent cough without sputum production.  She reports that her lower extremity changes are not really different than her baseline.  She is awake and alert but oriented to self and year only.  She denies nausea, vomiting, abdominal pain, dysuria, chest pain.  ED Course  Labs/Imaging on admission: I have personally reviewed following labs and imaging studies.  Patient initially presented to ED around noon earlier today.  Initial vitals showed BP 125/68, pulse 86, RR not recorded, temp 97.8 F, SpO2 95% on 2 L O2 via [place].  Labs show WBC 8.7, hemoglobin 10.2, platelets 198, sodium 142, potassium 4.3, bicarb 24, BUN 21, creatinine 1.34, serum glucose 1 1, troponin 13, BNP 122.7.  SARS-CoV-2, influenza, RV PCR negative.  UA negative for UTI.  Formal chest x-ray showed low lung volumes with central pulmonary vascular congestion and bibasilar scarring.  No pleural fluid.  CT head without contrast was negative for acute  intracranial pathology.  Admit small vessel white matter disease was seen.  Patient was given IV Lasix  20 mg and IV ceftriaxone  2 g.  The case was discussed with the hospitalist service who consulted on patient.  Patient's lower extremity edema and skin changes were felt to be longstanding in nature.  Patient did not have any documented hypoxia and on ambulation SPO2 remained 93% or higher.  She was mentating appropriately.  She is felt stable to return back to SNF.  Patient was given a prescription for Keflex .  On return to SNF patient reportedly was satting in the low 80s at the facility.  EMS were called and per EDP SPO2 was as low as 78% with ambulation.  She was placed on 2 L O2 via [place] and brought back to the ER.    On return to ED she had new fever 101.4 F.  Per EDP SPO2 dropped to 90% while on room air and she was placed back on 2 L O2 [place].  The hospitalist service was consulted for admission.  Review of Systems: All systems reviewed and are negative except as documented in history of present illness above.   Past Medical History:  Diagnosis Date   Anemia    Anxiety    Arthritis    rhematoid,osteoarthritis   Asthma    mild   Cancer (HCC)    kidney   Diabetes mellitus without complication (HCC)    Dyspnea    increased exertion   Elevated cholesterol    GERD (gastroesophageal reflux disease)  Hemorrhoids    Hypertension    Hypothyroidism    Joint pain    Nocturia    Pneumonia    Sleeping difficulties     Past Surgical History:  Procedure Laterality Date   ABDOMINAL HYSTERECTOMY     ANTERIOR CERVICAL DECOMP/DISCECTOMY FUSION N/A 06/15/2021   Procedure: ANTERIOR CERVICAL DECOMPRESSION/DISCECTOMY FUSION CERVICAL THREE-FOUR;  Surgeon: Lanis Pupa, MD;  Location: [facility] OR;  Service: Neurosurgery;  Laterality: N/A;   BACK SURGERY     BLADDER REPAIR N/A 10/30/2022   Procedure: BLADDER REPAIR;  Surgeon: Rosalind Zachary NOVAK, MD;  Location: [facility] ORS;  Service: Urology;   Laterality: N/A;   CERVICAL FUSION  2004   CHOLECYSTECTOMY     CYSTOSCOPY N/A 10/07/2022   Procedure: CYSTOSCOPY;  Surgeon: Devere Lonni Righter, MD;  Location: [facility];  Service: Urology;  Laterality: N/A;   EYE SURGERY Left    cataract   PARS PLANA REPAIR OF RETINAL DEATACHMENT     ROBOT ASSITED LAPAROSCOPIC NEPHROURETERECTOMY Left 12/26/2020   Procedure: XI ROBOT ASSITED LAPAROSCOPIC  LEFT NEPHROURETERECTOMY/ STENT PLACEMENT/ TRANSURETHRAL RESECTION OF LEFT URETERAL ORIFICE;EXCISION OF SPLENULE;  Surgeon: Devere Lonni Righter, MD;  Location: [facility];  Service: Urology;  Laterality: Left;   THYROIDECTOMY, PARTIAL      Social History: Social History   Tobacco Use   Smoking status: Never   Smokeless tobacco: Never  Vaping Use   Vaping status: Never Used  Substance Use Topics   Alcohol use: No   Drug use: No   Allergies  Allergen Reactions   Iodinated Contrast Media Anaphylaxis, Hives and Other (See Comments)    Only the retina dye- Allergic, per [facility]   Abilify [Aripiprazole] Other (See Comments)    Allergic, per [facility]   Ultram [Tramadol Hcl] Other (See Comments)    Allergic, per [facility]   Zithromax [Azithromycin] Anxiety and Other (See Comments)    Heightens anxiety and Allergic, per [facility]    Family History  Problem Relation Age of Onset   Diabetes Mother    Breast cancer Mother    Gout Mother    Alzheimer's disease Father    Heart Problems Father    Breast cancer Sister    Alzheimer's disease Sister    Diabetes Sister    Gout Sister    Heart Problems Sister    Alzheimer's disease Brother    Diabetes Brother    Heart Problems Brother      Prior to Admission medications   Medication Sig Start Date End Date Taking? Authorizing Provider  acetaminophen  (TYLENOL ) 500 MG tablet Take 1,000 mg by mouth every 6 (six) hours as needed for fever (or pain).    [provider]  ALPRAZolam  (XANAX ) 0.25 MG tablet Take 0.25 mg by mouth 2 (two) times daily as  needed for anxiety.    [provider]  ascorbic acid (VITAMIN C) 500 MG tablet Take 500 mg by mouth 3 (three) times a week.    [provider]  aspirin  81 MG chewable tablet Chew 81 mg by mouth daily. 05/24/21   [provider]  Benzocaine-Menthol  (SORE THROAT) 15-3.6 MG LOZG Use as directed 1 lozenge in the mouth or throat every 2 (two) hours as needed (sore throat).    [provider]  benztropine  (COGENTIN ) 0.5 MG tablet Take 0.5 mg by mouth in the morning and at bedtime.    [provider]  cephALEXin  (KEFLEX ) 500 MG capsule Take 1 capsule (500 mg total) by mouth 4 (four) times  daily. 01/27/24   Barrett, Warren SAILOR, PA-C  Cholecalciferol  (VITAMIN D ) 50 MCG (2000 UT) CAPS Take 2,000 Units by mouth daily.    [provider]  Dextran 70-Hypromellose (NATURAL BALANCE TEARS OP) Place 1 drop into the right eye as needed (irritation/itching).    [provider]  Dextromethorphan-guaiFENesin 5-100 MG/5ML LIQD Take 20 mLs by mouth every 4 (four) hours as needed (cough/congestion). 09/10/22   [provider]  escitalopram  (LEXAPRO ) 10 MG tablet Take 15 mg by mouth daily.    [provider]  ferrous sulfate  325 (65 FE) MG EC tablet Take 325 mg by mouth 3 (three) times a week.    [provider]  hydroxychloroquine  (PLAQUENIL ) 200 MG tablet Take 600 mg by mouth daily.    [provider]  hydrOXYzine  (VISTARIL ) 50 MG capsule Take 50 mg by mouth in the morning and at bedtime. 09/17/22   [provider]  ipratropium (ATROVENT) 0.03 % nasal spray Place 2 sprays into both nostrils 2 (two) times daily as needed for rhinitis. 05/16/22   [provider]  LASIX  20 MG tablet Take 20 mg by mouth daily.    [provider]  leflunomide  (ARAVA ) 20 MG tablet Take 20 mg by mouth daily.    [provider]  loperamide (IMODIUM A-D) 2 MG tablet Take 2 mg by mouth as needed for diarrhea or loose stools  (after each loose stool- MAX OF 3 TABLETS/24 HOURS).    [provider]  loratadine  (CLARITIN ) 10 MG tablet Take 1 tablet (10 mg total) by mouth daily. 08/22/22   Bernis Ernst, PA-C  melatonin 5 MG TABS Take 5 mg by mouth at bedtime.    [provider]  Menthol -Zinc Oxide (CALMOSEPTINE) 0.44-20.6 % OINT Place 1 Application into the perineum 3 (three) times daily as needed (reason not listed).    [provider]  metoprolol  succinate (TOPROL -XL) 25 MG 24 hr tablet Take 25 mg by mouth daily.    [provider]  Multiple Vitamins-Minerals (CENTRUM SILVER PO) Take 1 tablet by mouth daily.    [provider]  NON FORMULARY Place 1 application  into the right eye See admin instructions. Cool compress- 1 application to right eye as needed (reason not listed)    [provider]  nystatin  cream (MYCOSTATIN ) Apply 1 Application topically 2 (two) times daily as needed (for redness- under abdominal folds and/or under the breasts). 07/24/22   [provider]  nystatin  powder Apply 1 Application topically daily as needed (after bathing for brief change with excessive soiling).    [provider]  OLANZapine  (ZYPREXA ) 5 MG tablet Take 5 mg by mouth daily.    [provider]  polyethylene glycol (MIRALAX  / GLYCOLAX ) 17 g packet Take 17 g by mouth 2 (two) times daily. Patient taking differently: Take 17 g by mouth in the morning and at bedtime. 12/31/20   Regalado, Belkys A, MD  predniSONE  (DELTASONE ) 5 MG tablet Take 5 mg by mouth daily.    [provider]  Psyllium (REGULOID PO) Take 0.4 g by mouth at bedtime.    [provider]  Urea  40 % LOTN Apply 1 Application topically 2 (two) times daily as needed (dryness/itching).    [provider]    Physical Exam: Vitals:   01/27/24 2146 01/27/24 2150 01/27/24 2200 01/28/24 0019  BP: (!) 122/41  (!) 116/90 (!) 113/56  Pulse: 94 (!) 102 97 92  Resp:    (!) 22  Temp:    98.7 F (37.1 C)  TempSrc:    Oral  SpO2: 100% 91% 96% 96%  Weight:    77.1 kg  Height:    5' 4 (1.626 m)   Constitutional: Chronically ill-appearing woman resting in bed.  NAD, calm, comfortable Eyes: EOMI, lids and conjunctivae normal ENMT: Mucous membranes are moist. Posterior pharynx clear of any exudate or lesions.Normal dentition.  Neck: normal, supple, no masses. Respiratory: clear to auscultation bilaterally, no wheezing, no crackles. Normal respiratory effort while on 2 L O2 [place]. No accessory muscle use.  Cardiovascular: Regular rate and rhythm, no murmurs / rubs / gallops.  Minimal bilateral lower extremity edema. Abdomen: no tenderness, no masses palpated. Musculoskeletal: no clubbing / cyanosis. No joint deformity upper and lower extremities. Good ROM, no contractures. Normal muscle tone.  Skin: Chronic bilateral lower extremity skin changes with erythema as pictured below.  No open wounds or discharge. Neurologic: Sensation intact. Strength equal bilaterally. Psychiatric: Alert and oriented to self, year, month but not place or situation.      EKG: Personally reviewed. Sinus rhythm, rate 85, QTc 523, early R wave transition.  Assessment/Plan Principal Problem:   Dyspnea Active Problems:   Rheumatoid arthritis (HCC)   Essential hypertension   Depression   Anxiety   Chronic kidney disease, stage 3a (HCC)   [name] is a 78 y.o. female with medical history significant for HTN, CKD stage IIIa, RA on chronic prednisone , chronic anemia, chronic bilateral lower extremity edema with venous stasis, depression/anxiety, mild cognitive impairment who is admitted with fever, concern for ambulatory hypoxia.  Assessment and Plan: Dyspnea/reported ambulatory hypoxia/fever: Initially seen and discharged from ED after reassuring workup and sent back from SNF with reported ongoing hypoxia, SpO2 84% on RA with rest and 78% on RA with ambulation.  In the ED SPO2  dropped to 90% while at rest on RA, improved on 2 L O2 [place].  CXR with mild central vascular congestion but no overt pulmonary edema, effusion, or consolidation.  New fever 101.4 F on return to ED.  BMP minimally elevated at 122.7.  COVID, influenza, RSV negative.  Has chronic lower extremity edema but overall does not appear significantly volume overloaded. - Check full respiratory viral panel - Follow-up D-dimer - Continue supplemental oxygen as needed, wean as able - Check ambulatory O2 sats in a.m. - Obtain echocardiogram ADDENDUM: D-dimer positive including when adjusted for age.  Will obtain CTA chest.  Contrast allergy noted, will pretreat with IV Solu-Medrol  and Benadryl .  Chronic bilateral lower extremity edema Lower extremity rubor, skin changes, suggestive of venous stasis: Agree appears to be longstanding in nature.  Given new fever we will continue antibiotics she was prescribed earlier. - Continue Keflex  - Continue Lasix  20 mg daily  CKD stage IIIa: Appears to have baseline CKD stage IIIa.  Creatinine mildly increased from previous.  Continue to monitor.  Hypertension: Continue Toprol -XL.  Chronic normocytic anemia: Hemoglobin stable.  Rheumatoid arthritis: Continue chronic prednisone  5 mg daily.  Hold Arava  and Plaquenil  for now.  Depression/anxiety: Resume home Lexapro , Zyprexa , Cogentin , and Xanax  bid prn.  Mild cognitive impairment: Alert and oriented to self, year, month but not place or situation at time of admission.   DVT prophylaxis: enoxaparin  (LOVENOX ) injection 40 mg Start: 01/28/24 1000 Code Status: Full code Family Communication: None present on admission Disposition Plan: From SNF, dispo pending clinical progress Consults called: None Severity of Illness: The appropriate patient status for this patient is OBSERVATION. Observation status  is judged to be reasonable and necessary in order to provide the required intensity of service to ensure the  patient's safety. The patient's presenting symptoms, physical exam findings, and initial radiographic and laboratory data in the context of their medical condition is felt to place them at decreased risk for further clinical deterioration. Furthermore, it is anticipated that the patient will be medically stable for discharge from the hospital within 2 midnights of admission.   Jorie Blanch MD [facility]  If 7PM-7AM, please contact night-coverage [email or web address]  01/28/2024, 12:54 AM

## 2024-01-27 NOTE — ED Notes (Signed)
 Lab called to have troponin added

## 2024-01-27 NOTE — Consult Note (Signed)
 Initial Consultation Note   Patient: [name] FMW:[id number] DOB: January 21, 1946 PCP: Loretha Richerd SAUNDERS, MD DOA: 01/27/2024 DOS: the patient was seen and examined on 01/27/2024 Primary service: Laurice Maude JAYSON, MD  Referring PA: Ami Nian, PA Reason for consult: hypoxia  Assessment and Plan: Chronic bilateral lower extremity edema Lower extremity rubor, skin changes, suggestive of venous stasis. Appears to be longstanding in nature Pulmonary congestion on chest x-ray but lungs are clear.  BNP trivial elevation.  Last echocardiogram with normal LVEF. Patient on Lasix  as an outpatient. No evidence of infection.  Concern for hypoxia per EDP No documented hypoxia in the record. Hypoxia ruled out.  Concern for altered mental status per chart Patient alert and oriented.  Conversant and appropriate on exam.  No evidence of metabolic derangement or infection. No evidence of altered mental status or encephalopathy at this time. 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  Creatinine here 1.34 just slightly above baseline.  Reported history of diabetes mellitus type 2 Random blood sugar 101.  Anion gap within normal limits.  Normocytic anemia Stable.  Suspect patient is at baseline.  Do not see any indication for acute care hospitalization.  No evidence of acute metabolic or toxic encephalopathy, or altered mental status.  No evidence of hypoxia.  Appears to have chronic edema, would suggest increasing Lasix  dose, could consider updating echocardiogram outpatient.  Recommend attention to legs to prevent infection.  TRH will sign off at present, please call us  again when needed.  HPI:  78 year old woman PMH including rheumatoid arthritis, diabetes sent from [facility] to possible emergency department for AMS and pitting edema bilateral lower extremities as well as reported increased confusion.  Initial evaluation raise concern for hypoxia and  hospitalist service was consulted for further recommendations.  History obtained from patient at bedside, she is alert and oriented, she reports chronic lower extremity edema in her legs as well as chronic skin changes.  No shortness of breath or chest pain.  Review of systems is unrevealing.   No hypoxia charted.  Treated with Lasix .  Review of Systems: as above otherwise negative Past Medical History:  Diagnosis Date   Anemia    Anxiety    Arthritis    rhematoid,osteoarthritis   Asthma    mild   Cancer (HCC)    kidney   Diabetes mellitus without complication (HCC)    Dyspnea    increased exertion   Elevated cholesterol    GERD (gastroesophageal reflux disease)    Hemorrhoids    Hypertension    Hypothyroidism    Joint pain    Nocturia    Pneumonia    Sleeping difficulties    Past Surgical History:  Procedure Laterality Date   ABDOMINAL HYSTERECTOMY     ANTERIOR CERVICAL DECOMP/DISCECTOMY FUSION N/A 06/15/2021   Procedure: ANTERIOR CERVICAL DECOMPRESSION/DISCECTOMY FUSION CERVICAL THREE-FOUR;  Surgeon: Lanis Pupa, MD;  Location: [facility];  Service: Neurosurgery;  Laterality: N/A;   BACK SURGERY     BLADDER REPAIR N/A 10/30/2022   Procedure: BLADDER REPAIR;  Surgeon: Rosalind Zachary NOVAK, MD;  Location: [facility];  Service: Urology;  Laterality: N/A;   CERVICAL FUSION  2004   CHOLECYSTECTOMY     CYSTOSCOPY N/A 10/07/2022   Procedure: CYSTOSCOPY;  Surgeon: Devere Lonni Righter, MD;  Location: [facility];  Service: Urology;  Laterality: N/A;   EYE SURGERY Left    cataract   PARS PLANA REPAIR OF RETINAL DEATACHMENT  ROBOT ASSITED LAPAROSCOPIC NEPHROURETERECTOMY Left 12/26/2020   Procedure: XI ROBOT ASSITED LAPAROSCOPIC  LEFT NEPHROURETERECTOMY/ STENT PLACEMENT/ TRANSURETHRAL RESECTION OF LEFT URETERAL ORIFICE;EXCISION OF SPLENULE;  Surgeon: Devere Lonni Righter, MD;  Location: [facility];  Service: Urology;  Laterality: Left;   THYROIDECTOMY, PARTIAL     Social History:   reports that she has never smoked. She has never used smokeless tobacco. She reports that she does not drink alcohol and does not use drugs.  Allergies  Allergen Reactions   Iodinated Contrast Media Anaphylaxis, Hives and Other (See Comments)    Only the retina dye- Allergic, per [facility]   Abilify [Aripiprazole] Other (See Comments)    Allergic, per [facility]   Ultram [Tramadol Hcl] Other (See Comments)    Allergic, per [facility]   Zithromax [Azithromycin] Anxiety and Other (See Comments)    Heightens anxiety and Allergic, per [facility]    Family History  Problem Relation Age of Onset   Diabetes Mother    Breast cancer Mother    Gout Mother    Alzheimer's disease Father    Heart Problems Father    Breast cancer Sister    Alzheimer's disease Sister    Diabetes Sister    Gout Sister    Heart Problems Sister    Alzheimer's disease Brother    Diabetes Brother    Heart Problems Brother     Prior to Admission medications   Medication Sig Start Date End Date Taking? Authorizing Provider  acetaminophen  (TYLENOL ) 500 MG tablet Take 1,000 mg by mouth every 6 (six) hours as needed for fever (or pain).   Yes [provider]  ALPRAZolam  (XANAX ) 0.25 MG tablet Take 0.25 mg by mouth 2 (two) times daily as needed for anxiety.   Yes [provider]  ascorbic acid (VITAMIN C) 500 MG tablet Take 500 mg by mouth 3 (three) times a week.   Yes [provider]  aspirin  81 MG chewable tablet Chew 81 mg by mouth daily. 05/24/21  Yes [provider]  Benzocaine-Menthol  (SORE THROAT) 15-3.6 MG LOZG Use as directed 1 lozenge in the mouth or throat every 2 (two) hours as needed (sore throat).   Yes [provider]  benztropine  (COGENTIN ) 0.5 MG tablet Take 0.5 mg by mouth in the morning and at bedtime.   Yes [provider]  cephALEXin  (KEFLEX ) 500 MG capsule Take 1 capsule (500 mg total) by mouth 4 (four) times daily. 01/27/24  Yes Barrett, Warren SAILOR, PA-C   Cholecalciferol  (VITAMIN D ) 50 MCG (2000 UT) CAPS Take 2,000 Units by mouth daily.   Yes [provider]  Dextran 70-Hypromellose (NATURAL BALANCE TEARS OP) Place 1 drop into the right eye as needed (irritation/itching).   Yes [provider]  Dextromethorphan-guaiFENesin 5-100 MG/5ML LIQD Take 20 mLs by mouth every 4 (four) hours as needed (cough/congestion). 09/10/22  Yes [provider]  escitalopram  (LEXAPRO ) 10 MG tablet Take 15 mg by mouth daily.   Yes [provider]  ferrous sulfate  325 (65 FE) MG EC tablet Take 325 mg by mouth 3 (three) times a week.   Yes [provider]  hydroxychloroquine  (PLAQUENIL ) 200 MG tablet Take 600 mg by mouth daily.   Yes [provider]  hydrOXYzine  (VISTARIL ) 50 MG capsule Take 50 mg by mouth in the morning and at bedtime. 09/17/22  Yes [provider]  ipratropium (ATROVENT) 0.03 % nasal spray Place 2 sprays into both nostrils 2 (two) times daily as needed for rhinitis. 05/16/22  Yes [provider]  LASIX  20 MG tablet Take 20 mg by mouth daily.   Yes [provider]  leflunomide  (ARAVA ) 20 MG tablet Take 20 mg by mouth daily.   Yes [provider]  loperamide (IMODIUM A-D) 2 MG tablet Take 2 mg by mouth as needed for diarrhea or loose stools (after each loose stool- MAX OF 3 TABLETS/24 HOURS).   Yes [provider]  loratadine  (CLARITIN ) 10 MG tablet Take 1 tablet (10 mg total) by mouth daily. 08/22/22  Yes Bernis Ernst, PA-C  melatonin 5 MG TABS Take 5 mg by mouth at bedtime.   Yes [provider]  Menthol -Zinc Oxide (CALMOSEPTINE) 0.44-20.6 % OINT Place 1 Application into the perineum 3 (three) times daily as needed (reason not listed).   Yes [provider]  metoprolol  succinate (TOPROL -XL) 25 MG 24 hr tablet Take 25 mg by mouth daily.   Yes [provider]  Multiple Vitamins-Minerals (CENTRUM SILVER PO) Take 1 tablet by mouth daily.    Yes [provider]  NON FORMULARY Place 1 application  into the right eye See admin instructions. Cool compress- 1 application to right eye as needed (reason not listed)   Yes [provider]  nystatin  cream (MYCOSTATIN ) Apply 1 Application topically 2 (two) times daily as needed (for redness- under abdominal folds and/or under the breasts). 07/24/22  Yes [provider]  nystatin  powder Apply 1 Application topically daily as needed (after bathing for brief change with excessive soiling).   Yes [provider]  OLANZapine  (ZYPREXA ) 5 MG tablet Take 5 mg by mouth daily.   Yes [provider]  polyethylene glycol (MIRALAX  / GLYCOLAX ) 17 g packet Take 17 g by mouth 2 (two) times daily. Patient taking differently: Take 17 g by mouth in the morning and at bedtime. 12/31/20  Yes Regalado, Belkys A, MD  predniSONE  (DELTASONE ) 5 MG tablet Take 5 mg by mouth daily.   Yes [provider]  Psyllium (REGULOID PO) Take 0.4 g by mouth at bedtime.   Yes [provider]  Urea  40 % LOTN Apply 1 Application topically 2 (two) times daily as needed (dryness/itching).   Yes [provider]    Physical Exam: Vitals:   01/27/24 1217 01/27/24 1220 01/27/24 1223  BP:   125/68  Pulse:   86  Temp:   97.8 F (36.6 C)  TempSrc:   Oral  SpO2: 98%  95%  Weight:  74.8 kg   Height:  5' 4 (1.626 m)    Physical Exam Vitals reviewed.  Constitutional:      General: She is not in acute distress.    Appearance: She is not ill-appearing or toxic-appearing.  Cardiovascular:     Rate and Rhythm: Normal rate and regular rhythm.     Heart sounds: No murmur heard.    Comments: Dorsalis pedis pulses 2+ bilaterally Pulmonary:     Effort: Pulmonary effort is normal. No respiratory distress.     Breath sounds: No wheezing, rhonchi or rales.  Abdominal:     General: There is no distension.     Palpations: Abdomen is soft.     Tenderness: There is no  abdominal tenderness. There is no guarding.  Musculoskeletal:     Right lower leg: Edema present.     Left lower leg: Edema present.  Skin:    General: Skin is warm.     Comments: Chronic bilateral lower extremity skin changes, erythema, with some areas perhaps  of previous blister.  By.  No weeping noted.  Pitting edema noted.  No calor.  Neurological:     Mental Status: She is alert and oriented to person, place, and time.     Comments: Oriented to self, location [facility], month, year, with very quick answers.  Psychiatric:        Mood and Affect: Mood normal.        Behavior: Behavior normal.     Data Reviewed:  Creatinine 1.34, baseline 1.1 - 1.2 CXR independently reviewed bibasilar congestion CT head NAD Hgb stable 10.2 Echo 2022 : LVEF 60-65%, normal LV diastolic fxn EKG independent review sinus rhythm, prolonged QT, seen on last test 01/14/2023  Primary team communication: Impression and recommendations conveyed personally requesting PA  Thank you very much for involving us  in the care of your patient.  Author: Toribio Door, MD 01/27/2024 6:11 PM  For on call review [email or web address].

## 2024-01-27 NOTE — Hospital Course (Signed)
  Creatinine 1.34, baseline 1.1 - 1.2 CXR independently reviewed bibasilar congestion CT head NAD Hgb stable 10.2 Echo 2022 : LVEF 60-65%, normal LV diastolic fxn EKG independent review sinus rhythm, prolonged QT, seen on last test 01/14/2023

## 2024-01-27 NOTE — ED Provider Notes (Signed)
 [place] EMERGENCY DEPARTMENT AT [facility] Provider Note   CSN: [id number] Arrival date & time: 01/27/24  1207     Patient presents with: Leg Swelling and Altered Mental Status   [name] is a 78 y.o. female.  With past medical history of hide prior tension hyperlipidemia, diabetes presents to emergency room with complaint of bilateral lower extremity swelling, redness.  She reports this has been ongoing for several months but has worsened over the past few days.  She denies any fever.  She notes bilateral ear discomfort.  Was apparently sent here from her facility due to altered mental status. She is on 2L [place], no O2 at home, reports she has had some shortness of breath over the last few days.     Altered Mental Status      Prior to Admission medications   Medication Sig Start Date End Date Taking? Authorizing Provider  ACCU-CHEK GUIDE test strip Check blood sugar every morning 10/29/17   [provider]  acetaminophen  (TYLENOL ) 500 MG tablet Take 1,000 mg by mouth every 6 (six) hours as needed for fever (or pain).    [provider]  aspirin  81 MG chewable tablet Chew 81 mg by mouth daily. 05/24/21   [provider]  Benzocaine-Menthol  (SORE THROAT) 15-3.6 MG LOZG Use as directed 1 lozenge in the mouth or throat every 2 (two) hours as needed (sore throat).    [provider]  benztropine  (COGENTIN ) 0.5 MG tablet Take 0.5 mg by mouth in the morning and at bedtime.    [provider]  Cholecalciferol  (VITAMIN D3) 50 MCG (2000 UT) TABS Take 2,000 Units by mouth in the morning.    [provider]  Dextromethorphan-guaiFENesin 5-100 MG/5ML LIQD Take 20 mLs by mouth every 4 (four) hours as needed (cough/congestion). 09/10/22   [provider]  ENULOSE 10 GM/15ML SOLN Take 10 g by mouth daily. 07/25/22   [provider]  escitalopram  (LEXAPRO ) 10 MG tablet Take 15 mg by mouth at bedtime.    [provider]  ferrous sulfate  325 (65 FE) MG EC tablet Take 325 mg by mouth every Monday, Wednesday, and Friday.    [provider]  furosemide  (LASIX ) 40 MG tablet Take 0.5 tablets (20 mg total) by mouth daily. Patient not taking: Reported on 11/10/2022 11/03/22   Will Almarie MATSU, MD  HYDROcodone -acetaminophen  (NORCO/VICODIN) 5-325 MG tablet Take 1 tablet by mouth every 4 (four) hours as needed (for pain).    [provider]  hydroxychloroquine  (PLAQUENIL ) 200 MG tablet Take 600 mg by mouth daily.    [provider]  hydrOXYzine  (VISTARIL ) 50 MG capsule Take 50 mg by mouth in the morning and at bedtime. 09/17/22   [provider]  ipratropium (ATROVENT) 0.03 % nasal spray Place 2 sprays into both nostrils 2 (two) times daily as needed for rhinitis. 05/16/22   [provider]  LASIX  20 MG tablet Take 20 mg by mouth in the morning.    [provider]  leflunomide  (ARAVA ) 20 MG tablet Take 20 mg by mouth daily.    [provider]  loperamide (IMODIUM A-D) 2 MG tablet Take 2 mg by mouth See admin instructions. Take 2 mg by mouth as needed/as directed after each loose stool- MAX OF 3 TABLETS/24 HOURS    [provider]  loratadine  (CLARITIN ) 10 MG tablet Take 1 tablet (10 mg total) by mouth daily. Patient taking differently: Take 10 mg by mouth  in the morning. 08/22/22   Bernis Ernst, PA-C  melatonin 5 MG TABS Take 5 mg by mouth at bedtime.    [provider]  metoprolol  succinate (TOPROL -XL) 25 MG 24 hr tablet Take 25 mg by mouth daily.    [provider]  nystatin  cream (MYCOSTATIN ) Apply 1 Application topically 2 (two) times daily as needed (for redness- under abdominal folds and/or under the breasts). 07/24/22   [provider]  OLANZapine  (ZYPREXA ) 5 MG tablet Take 5 mg by mouth daily.    [provider]  polyethylene glycol (MIRALAX  / GLYCOLAX ) 17 g packet Take 17 g by mouth 2 (two) times  daily. Patient taking differently: Take 17 g by mouth in the morning and at bedtime. 12/31/20   Regalado, Belkys A, MD  predniSONE  (DELTASONE ) 5 MG tablet Take 5 mg by mouth daily with breakfast.    [provider]  trimethoprim -polymyxin b  (POLYTRIM ) ophthalmic solution Place 1 drop into both eyes every 4 (four) hours. While awake Patient not taking: Reported on 10/01/2022 08/22/22   Bernis Ernst, PA-C  vitamin C (ASCORBIC ACID) 250 MG tablet Take 250 mg by mouth every Monday, Wednesday, and Friday.    [provider]    Allergies: Iodinated contrast media, Abilify [aripiprazole], Ultram [tramadol hcl], and Zithromax [azithromycin]    Review of Systems  Skin:  Positive for wound.    Updated Vital Signs BP 125/68 (BP Location: Left Arm)   Pulse 86   Temp 97.8 F (36.6 C) (Oral)   Ht 5' 4 (1.626 m)   Wt 74.8 kg   SpO2 95%   BMI 28.32 kg/m   Physical Exam Vitals and nursing note reviewed.  Constitutional:      General: She is not in acute distress.    Appearance: She is not toxic-appearing.  HENT:     Head: Normocephalic and atraumatic.  Eyes:     General: No scleral icterus.    Conjunctiva/sclera: Conjunctivae normal.  Cardiovascular:     Rate and Rhythm: Normal rate and regular rhythm.     Pulses: Normal pulses.     Heart sounds: Normal heart sounds.  Pulmonary:     Effort: Pulmonary effort is normal. No respiratory distress.     Breath sounds: Normal breath sounds.  Abdominal:     General: Abdomen is flat. Bowel sounds are normal.     Palpations: Abdomen is soft.     Tenderness: There is no abdominal tenderness.  Musculoskeletal:     Right lower leg: Edema present.     Left lower leg: Edema present.  Skin:    General: Skin is warm and dry.     Findings: No lesion.  Neurological:     General: No focal deficit present.     Mental Status: She is alert and oriented to person, place, and time. Mental status is at baseline.        (all labs  ordered are listed, but only abnormal results are displayed) Labs Reviewed  COMPREHENSIVE METABOLIC PANEL WITH GFR - Abnormal; Notable for the following components:      Result Value   Glucose, Bld 101 (*)    Creatinine, Ser 1.34 (*)    Calcium  8.2 (*)    Albumin  3.4 (*)    AST 47 (*)    GFR, Estimated 41 (*)    All other components within normal limits  CBC - Abnormal; Notable for the following components:   RBC 3.46 (*)    Hemoglobin 10.2 (*)  HCT 33.0 (*)    All other components within normal limits  BRAIN NATRIURETIC PEPTIDE - Abnormal; Notable for the following components:   B Natriuretic Peptide 122.7 (*)    All other components within normal limits  URINALYSIS, ROUTINE W REFLEX MICROSCOPIC - Abnormal; Notable for the following components:   Color, Urine STRAW (*)    Specific Gravity, Urine 1.003 (*)    All other components within normal limits  RESP PANEL BY RT-PCR (RSV, FLU A&B, COVID)  [id number]  TROPONIN I (HIGH SENSITIVITY)  TROPONIN I (HIGH SENSITIVITY)    EKG: None  Radiology: [facility] Chest Port 1 View Result Date: 01/27/2024 CLINICAL DATA:  Shortness of breath.  Pitting edema in both legs. EXAM: PORTABLE CHEST 1 VIEW COMPARISON:  01/06/2023 and CT chest 11/10/2022. FINDINGS: Patient is rotated. Heart size stable. Lungs are low in volume with central pulmonary vascular congestion and bibasilar scarring. No pleural fluid. IMPRESSION: Low lung volumes with central pulmonary vascular congestion and bibasilar scarring. Electronically Signed   By: Newell Eke M.D.   On: 01/27/2024 13:31     Procedures   Medications Ordered in the ED - No data to display                                  Medical Decision Making Amount and/or Complexity of Data Reviewed Labs: ordered. Radiology: ordered.  Risk Prescription drug management. Decision regarding hospitalization.   This patient presents to the ED for concern of bilateral lower extremity edema, this involves an  extensive number of treatment options, and is a complaint that carries with it a high risk of complications and morbidity.  The differential diagnosis includes DVT, cellulitis, congestive heart failure, electrolyte abnormality, AKI   Co morbidities that complicate the patient evaluation  Dementia   Lab Tests:  I personally interpreted labs.  The pertinent results include:   No leukocytosis.  Hemoglobin stable at 10.2 CMP with creatinine 1.3 which is comparable to prior recent labs. Normal Trop. BNP 122.  No UTI   Imaging Studies ordered:  I ordered imaging studies including CT head, Chest xray  I independently visualized and interpreted imaging which showed no acute findings head CT Chest x-ray shows congestion. I agree with the radiologist interpretation   Cardiac Monitoring: / EKG:  The patient was maintained on a cardiac monitor.  I personally viewed and interpreted the cardiac monitored which showed an underlying rhythm of: sinus    Consultations Obtained:  I requested consultation with the hospitalist,  and discussed lab and imaging findings as well as pertinent plan - they recommend: discharge, lasix , echo OP and wound care.  Dr Jadine evaluated patient and saw patient, consult appreciated.  Please see note.   Problem List / ED Course / Critical interventions / Medication management  Patient presented to emergency room with chief complaint of altered mental status and lower extremity swelling.  Attempted to contact patient's family members and facility multiple times but was unable to get further clarification.  Patient seems to be a good historian and is alert and oriented.  She especially shortness of breath, lower extremity swelling and worsening leg wounds.  She has no leukocytosis and no fever.  Feel leg wounds are most likely secondary to chronic venous insufficiency causing dermatitis.  Will trial 5 days of antibiotics and give referral information for wound care.  BNP 122 with sign of congestion of x-ray, felt patient may benefit  from admission and repeat echo. Also requiring oxygen with EMS, but was removed from oxygen and ambulated with pulse ox without dropping.  Consulted hospitalist for admission who felt patient did not meet admission criteria. Please see notes for further details.  I ordered medication including Lasix , Rocephin  Reevaluation of the patient after these medicines showed that the patient stayed the same I have reviewed the patients home medicines and have made adjustments as needed   Plan F/u w/ PCP in 2-3d to ensure resolution of sx.  Patient was given return precautions. Patient stable for discharge at this time.  Patient educated on sx/dx and verbalized understanding of plan. Return to ER w/ new or worsening sx.       Final diagnoses:  Peripheral edema  Cellulitis of lower extremity, unspecified laterality    ED Discharge Orders          Ordered    cephALEXin  (KEFLEX ) 500 MG capsule  4 times daily        01/27/24 1736               Robecca Fulgham, Warren SAILOR, PA-C 01/27/24 1855    Laurice Maude BROCKS, MD 01/28/24 [phone]

## 2024-01-27 NOTE — ED Notes (Signed)
 Patient temp is 101.4 RN notified

## 2024-01-27 NOTE — ED Provider Notes (Signed)
 [place] [facility]MENT AT [facility] Provider Note   CSN: [id number] Arrival date & time: 01/27/24  2055     Patient presents with: No chief complaint on file.   [name] is a 78 y.o. female.   With past medical history of hide prior tension hyperlipidemia, diabetes presents to emergency room with complaint of bilateral lower extremity swelling and shortness of breath. She is on 2L [place], no O2 at home, reports she has had some shortness of breath over the last few days. Was here earlier on 2L  but ambulated okay months was discharged home.  Went back to the facility and oxygen was found to be in the low 80s upper 70s.  She was placed on 2 L nasal cannula again and brought back to the ER.   HPI     Prior to Admission medications   Medication Sig Start Date End Date Taking? Authorizing Provider  acetaminophen  (TYLENOL ) 500 MG tablet Take 1,000 mg by mouth every 6 (six) hours as needed for fever (or pain).    [provider]  ALPRAZolam  (XANAX ) 0.25 MG tablet Take 0.25 mg by mouth 2 (two) times daily as needed for anxiety.    [provider]  ascorbic acid (VITAMIN C) 500 MG tablet Take 500 mg by mouth 3 (three) times a week.    [provider]  aspirin  81 MG chewable tablet Chew 81 mg by mouth daily. 05/24/21   [provider]  Benzocaine-Menthol  (SORE THROAT) 15-3.6 MG LOZG Use as directed 1 lozenge in the mouth or throat every 2 (two) hours as needed (sore throat).    [provider]  benztropine  (COGENTIN ) 0.5 MG tablet Take 0.5 mg by mouth in the morning and at bedtime.    [provider]  cephALEXin  (KEFLEX ) 500 MG capsule Take 1 capsule (500 mg total) by mouth 4 (four) times daily. 01/27/24   Keya Wynes, Warren SAILOR, PA-C  Cholecalciferol  (VITAMIN D ) 50 MCG (2000 UT) CAPS Take 2,000 Units by mouth daily.    [provider]  Dextran 70-Hypromellose (NATURAL BALANCE TEARS OP) Place 1 drop into the  right eye as needed (irritation/itching).    [provider]  Dextromethorphan-guaiFENesin 5-100 MG/5ML LIQD Take 20 mLs by mouth every 4 (four) hours as needed (cough/congestion). 09/10/22   [provider]  escitalopram  (LEXAPRO ) 10 MG tablet Take 15 mg by mouth daily.    [provider]  ferrous sulfate  325 (65 FE) MG EC tablet Take 325 mg by mouth 3 (three) times a week.    [provider]  hydroxychloroquine  (PLAQUENIL ) 200 MG tablet Take 600 mg by mouth daily.    [provider]  hydrOXYzine  (VISTARIL ) 50 MG capsule Take 50 mg by mouth in the morning and at bedtime. 09/17/22   [provider]  ipratropium (ATROVENT) 0.03 % nasal spray Place 2 sprays into both nostrils 2 (two) times daily as needed for rhinitis. 05/16/22   [provider]  LASIX  20 MG tablet Take 20 mg by mouth daily.    [provider]  leflunomide  (ARAVA ) 20 MG tablet Take 20 mg by mouth daily.    [provider]  loperamide (IMODIUM A-D) 2 MG tablet Take 2 mg by mouth as needed for diarrhea or loose stools (after each loose stool- MAX OF 3 TABLETS/24 HOURS).    [provider]  loratadine  (CLARITIN ) 10 MG tablet Take 1 tablet (10 mg total) by mouth daily. 08/22/22  Bernis Ernst, PA-C  melatonin 5 MG TABS Take 5 mg by mouth at bedtime.    [provider]  Menthol -Zinc Oxide (CALMOSEPTINE) 0.44-20.6 % OINT Place 1 Application into the perineum 3 (three) times daily as needed (reason not listed).    [provider]  metoprolol  succinate (TOPROL -XL) 25 MG 24 hr tablet Take 25 mg by mouth daily.    [provider]  Multiple Vitamins-Minerals (CENTRUM SILVER PO) Take 1 tablet by mouth daily.    [provider]  NON FORMULARY Place 1 application  into the right eye See admin instructions. Cool compress- 1 application to right eye as needed (reason not listed)    [provider]  nystatin  cream  (MYCOSTATIN ) Apply 1 Application topically 2 (two) times daily as needed (for redness- under abdominal folds and/or under the breasts). 07/24/22   [provider]  nystatin  powder Apply 1 Application topically daily as needed (after bathing for brief change with excessive soiling).    [provider]  OLANZapine  (ZYPREXA ) 5 MG tablet Take 5 mg by mouth daily.    [provider]  polyethylene glycol (MIRALAX  / GLYCOLAX ) 17 g packet Take 17 g by mouth 2 (two) times daily. Patient taking differently: Take 17 g by mouth in the morning and at bedtime. 12/31/20   Regalado, Belkys A, MD  predniSONE  (DELTASONE ) 5 MG tablet Take 5 mg by mouth daily.    [provider]  Psyllium (REGULOID PO) Take 0.4 g by mouth at bedtime.    [provider]  Urea  40 % LOTN Apply 1 Application topically 2 (two) times daily as needed (dryness/itching).    [provider]    Allergies: Iodinated contrast media, Abilify [aripiprazole], Ultram [tramadol hcl], and Zithromax [azithromycin]    Review of Systems  Respiratory:  Positive for shortness of breath.     Updated Vital Signs There were no vitals taken for this visit.  Physical Exam Vitals and nursing note reviewed.  Constitutional:      General: She is not in acute distress.    Appearance: She is not toxic-appearing.  HENT:     Head: Normocephalic and atraumatic.  Eyes:     General: No scleral icterus.    Conjunctiva/sclera: Conjunctivae normal.  Cardiovascular:     Rate and Rhythm: Normal rate and regular rhythm.     Pulses: Normal pulses.     Heart sounds: Normal heart sounds.  Pulmonary:     Effort: Pulmonary effort is normal. No respiratory distress.     Breath sounds: Normal breath sounds.  Abdominal:     General: Abdomen is flat. Bowel sounds are normal.     Palpations: Abdomen is soft.     Tenderness: There is no abdominal tenderness.  Musculoskeletal:     Right lower leg: Edema present.      Left lower leg: Edema present.  Skin:    General: Skin is warm and dry.     Findings: No lesion.  Neurological:     General: No focal deficit present.     Mental Status: She is alert and oriented to person, place, and time. Mental status is at baseline.     (all labs ordered are listed, but only abnormal results are displayed) Labs Reviewed - No data to display  EKG: None  Radiology: CT Head Wo Contrast Result Date: 01/27/2024 CLINICAL DATA:  Altered mental status EXAM: CT HEAD WITHOUT CONTRAST TECHNIQUE: Contiguous axial images were obtained from the base of the  skull through the vertex without intravenous contrast. RADIATION DOSE REDUCTION: This exam was performed according to the departmental dose-optimization program which includes automated exposure control, adjustment of the mA and/or kV according to patient size and/or use of iterative reconstruction technique. COMPARISON:  05/19/2023 FINDINGS: Brain: No evidence of acute infarction, hemorrhage, hydrocephalus, extra-axial collection or mass lesion/mass effect. Extensive periventricular and deep white matter hypodensity. Vascular: No hyperdense vessel or unexpected calcification. Skull: Normal. Negative for fracture or focal lesion. Sinuses/Orbits: No acute finding. Other: None. IMPRESSION: No acute intracranial pathology. Advanced small-vessel white matter disease. Electronically Signed   By: Marolyn JONETTA Jaksch M.D.   On: 01/27/2024 15:51   DG Chest Port 1 View Result Date: 01/27/2024 CLINICAL DATA:  Shortness of breath.  Pitting edema in both legs. EXAM: PORTABLE CHEST 1 VIEW COMPARISON:  01/06/2023 and CT chest 11/10/2022. FINDINGS: Patient is rotated. Heart size stable. Lungs are low in volume with central pulmonary vascular congestion and bibasilar scarring. No pleural fluid. IMPRESSION: Low lung volumes with central pulmonary vascular congestion and bibasilar scarring. Electronically Signed   By: Newell Eke M.D.   On: 01/27/2024  13:31     Procedures   Medications Ordered in the ED - No data to display  Clinical Course as of 01/27/24 2211  Wed Jan 27, 2024  2211 Patient was taken off of oxygen.  Desatted to 91%.  She was then placed back on 2 L nasal cannula with improvement of saturation. Dr. Marea Dr. Tobie who agrees to see the patient. [JB]    Clinical Course User Index [JB] Toshika Parrow, Warren SAILOR, PA-C                                 Medical Decision Making Risk OTC drugs. Decision regarding hospitalization.   This patient presents to the ED for concern of SOB, this involves an extensive number of treatment options, and is a complaint that carries with it a high risk of complications and morbidity.  The differential diagnosis includes PE, CHF, COPD, PNA, ACS    Problem List / ED Course / Critical interventions / Medication management  Patient reports emergency room with complaint of low oxygen.  She was satting in the low 80s at facility.  She was placed on 2 L nasal cannula with improvement.  She has had shortness of breath for few days.  Earlier today she was seen in ER for similar complaint found to have bilateral lower extremity edema and mild chest congestion on her chest x-ray.  Since you are not hypoxic the majority of her stay she ambulated with steady gait and no desaturation that she was discharged.  Since she is back with repeat hypoxia I feel this warrants admission and further observation.  Will not repeat labs or imaging as these were done less than 5 hours ago and she is back for same symptoms.   I have reviewed the patients home medicines and have made adjustments as needed        Final diagnoses:  Hypoxia    ED Discharge Orders     None          Shermon Warren SAILOR, PA-C 01/27/24 2211    Ruthe Cornet, DO 01/27/24 2253

## 2024-01-27 NOTE — ED Notes (Signed)
 Lunch tray given patient tolerated well

## 2024-01-27 NOTE — ED Notes (Signed)
 Patient able to ambulate from bed to [facility] and into hall shortly before returning to bed O2 did drop to 93% RA

## 2024-01-27 NOTE — ED Triage Notes (Signed)
 Patient to ED by EMS from [facility] for AMS and weeping pitting edema to both legs. Per facility patient is normally A&Ox2 but is more confused. 140/60 98% 2l via [place] [facility]: 154

## 2024-01-28 ENCOUNTER — Observation Stay (HOSPITAL_COMMUNITY)

## 2024-01-28 ENCOUNTER — Encounter (HOSPITAL_COMMUNITY): Payer: Self-pay | Admitting: Internal Medicine

## 2024-01-28 DIAGNOSIS — N1831 Chronic kidney disease, stage 3a: Secondary | ICD-10-CM

## 2024-01-28 DIAGNOSIS — E049 Nontoxic goiter, unspecified: Secondary | ICD-10-CM | POA: Diagnosis not present

## 2024-01-28 DIAGNOSIS — I878 Other specified disorders of veins: Secondary | ICD-10-CM | POA: Diagnosis not present

## 2024-01-28 DIAGNOSIS — F32A Depression, unspecified: Secondary | ICD-10-CM

## 2024-01-28 DIAGNOSIS — R0609 Other forms of dyspnea: Secondary | ICD-10-CM | POA: Diagnosis not present

## 2024-01-28 DIAGNOSIS — I10 Essential (primary) hypertension: Secondary | ICD-10-CM

## 2024-01-28 DIAGNOSIS — M069 Rheumatoid arthritis, unspecified: Secondary | ICD-10-CM | POA: Diagnosis not present

## 2024-01-28 DIAGNOSIS — F419 Anxiety disorder, unspecified: Secondary | ICD-10-CM

## 2024-01-28 DIAGNOSIS — R509 Fever, unspecified: Secondary | ICD-10-CM | POA: Diagnosis not present

## 2024-01-28 DIAGNOSIS — I700 Atherosclerosis of aorta: Secondary | ICD-10-CM | POA: Diagnosis not present

## 2024-01-28 DIAGNOSIS — R0600 Dyspnea, unspecified: Secondary | ICD-10-CM | POA: Diagnosis not present

## 2024-01-28 DIAGNOSIS — R0602 Shortness of breath: Secondary | ICD-10-CM

## 2024-01-28 DIAGNOSIS — R918 Other nonspecific abnormal finding of lung field: Secondary | ICD-10-CM | POA: Diagnosis not present

## 2024-01-28 LAB — MRSA NEXT GEN BY PCR, NASAL: MRSA by PCR Next Gen: DETECTED — AB

## 2024-01-28 LAB — RESPIRATORY PANEL BY PCR

## 2024-01-28 LAB — ECHOCARDIOGRAM COMPLETE
Area-P 1/2: 4.49 cm2
Calc EF: 69.6 %
Height: 64 in
P 1/2 time: 315 ms
S' Lateral: 2.2 cm
Single Plane A2C EF: 70.5 %
Single Plane A4C EF: 69.1 %
Weight: 2709.01 [oz_av]

## 2024-01-28 LAB — BASIC METABOLIC PANEL WITH GFR
Anion gap: 12 (ref 5–15)
BUN: 25 mg/dL — ABNORMAL HIGH (ref 8–23)
CO2: 25 mmol/L (ref 22–32)
Calcium: 7.8 mg/dL — ABNORMAL LOW (ref 8.9–10.3)
Chloride: 105 mmol/L (ref 98–111)
Creatinine, Ser: 1.52 mg/dL — ABNORMAL HIGH (ref 0.44–1.00)
GFR, Estimated: 35 mL/min — ABNORMAL LOW (ref >60)
Glucose, Bld: 127 mg/dL — ABNORMAL HIGH (ref 70–99)
Potassium: 3.6 mmol/L (ref 3.5–5.1)
Sodium: 142 mmol/L (ref 135–145)

## 2024-01-28 LAB — CBC
HCT: 29.9 % — ABNORMAL LOW (ref 36.0–46.0)
Hemoglobin: 9.3 g/dL — ABNORMAL LOW (ref 12.0–15.0)
MCH: 29.6 pg (ref 26.0–34.0)
MCHC: 31.1 g/dL (ref 30.0–36.0)
MCV: 95.2 fL (ref 80.0–100.0)
Platelets: 185 K/uL (ref 150–400)
RBC: 3.14 MIL/uL — ABNORMAL LOW (ref 3.87–5.11)
RDW: 14.6 % (ref 11.5–15.5)
WBC: 10.8 K/uL — ABNORMAL HIGH (ref 4.0–10.5)
nRBC: 0 % (ref 0.0–0.2)

## 2024-01-28 LAB — D-DIMER, QUANTITATIVE: D-Dimer, Quant: 2.08 ug{FEU}/mL — ABNORMAL HIGH (ref 0.00–0.50)

## 2024-01-28 MED ORDER — CHLORHEXIDINE GLUCONATE CLOTH 2 % EX PADS
6.0000 | MEDICATED_PAD | Freq: Every day | CUTANEOUS | Status: AC
Start: 2024-01-28 — End: 2024-02-02
  Administered 2024-01-28 – 2024-02-01 (×5): 6 via TOPICAL

## 2024-01-28 MED ORDER — MUPIROCIN 2 % EX OINT
1.0000 | TOPICAL_OINTMENT | Freq: Two times a day (BID) | CUTANEOUS | Status: DC
  Administered 2024-01-28 – 2024-02-01 (×9): 1 via NASAL
  Filled 2024-01-28: qty 22

## 2024-01-28 MED ORDER — DIPHENHYDRAMINE HCL 25 MG PO CAPS: 50.0000 mg | ORAL_CAPSULE | Freq: Once | ORAL | Status: AC

## 2024-01-28 MED ORDER — DIPHENHYDRAMINE HCL 50 MG/ML IJ SOLN
50.0000 mg | Freq: Once | INTRAMUSCULAR | Status: AC
  Administered 2024-01-28: 50 mg via INTRAVENOUS
  Filled 2024-01-28: qty 1

## 2024-01-28 MED ORDER — IOHEXOL 350 MG/ML SOLN
60.0000 mL | Freq: Once | INTRAVENOUS | Status: AC | PRN
  Administered 2024-01-28: 60 mL via INTRAVENOUS

## 2024-01-28 MED ORDER — METHYLPREDNISOLONE SODIUM SUCC 40 MG IJ SOLR
40.0000 mg | Freq: Once | INTRAMUSCULAR | Status: AC
  Administered 2024-01-28: 40 mg via INTRAVENOUS
  Filled 2024-01-28: qty 1

## 2024-01-28 NOTE — Progress Notes (Signed)
 PROGRESS NOTE    [name]  FMW:[id number] DOB: 01/17/1946 DOA: 01/27/2024 PCP: Loretha Richerd SAUNDERS, MD    No chief complaint on file.   Brief Narrative:  Patient is 78 year old female with history of hypertension, CKD stage IIIa, RA on chronic prednisone , chronic anemia, chronic bilateral lower extremity edema with venous stasis depression/anxiety/mild cognitive impairment who was admitted with fever, concern for ambulatory hypoxia.   Assessment & Plan:   Principal Problem:   Dyspnea Active Problems:   Rheumatoid arthritis (HCC)   Essential hypertension   Depression   Anxiety   Chronic kidney disease, stage 3a (HCC)   Fever   Venous stasis of both lower extremities  #1 reported ambulatory hypoxia/dyspnea/fever -Patient noted initially seen and discharged from the ED after reassuring workup and sent back to SNF presenting back with reported ongoing hypoxia with sats of 84% on room air with rest and 78% on room air with ambulation. - It is noted that in the ED patient sats dropped to 90% while at rest on room air and improved on 2 L O2. - Chest x-ray done with mild central vascular congestion but no overt pulmonary edema or effusion or consolidation. - Patient noted to have a temp of 101.4 on presentation back to the ED. - BNP minimally elevated at 122.7. - SARS coronavirus PCR negative, influenza A and B PCR negative, RSV PCR negative. - Patient noted with chronic lower extremity edema however did not appear volume overloaded. - Respiratory viral panel negative. - CT angiogram chest negative for PE or infiltrate. - 2D echo ordered and pending -Urinalysis nitrite negative, leukocytes negative. - Fever curve trending down. - Repeat sats down today with sats of 95% on room air at rest, sats of 94% on room air while transferring. - Check blood cultures x 2. - Follow.  2.  Chronic bilateral lower extremity edema/lower extremity rubor, skin changes, suggestive of venous  stasis -Noted to be longstanding in nature. - Due to presentation with fevers with temp of 101.4 on admission patient placed on oral Keflex . - Continue Lasix  20 mg daily. - Follow.  3.  CKD stage IIIa -Stable.  4.  Hypertension -Continue Toprol -XL.  5.  Chronic normocytic anemia -H&H stable.  6.  Rheumatoid arthritis - Continue current chronic prednisone  of 5 mg daily. - Continue to hold Arava  and Plaquenil .  7.  Depression/anxiety -Continue Lexapro , Zyprexa , Cogentin , Xanax  twice daily as needed.  8.  Mild cognitive impairment -Stable. - Outpatient follow-up.   DVT prophylaxis: Lovenox  Code Status: Full Family Communication: Updated patient.  No family at bedside. Disposition: Back to nursing facility once afebrile for 24 to 48 hours, clinical improvement.  Status is: Observation The patient remains OBS appropriate and will d/c before 2 midnights.   Consultants:  None  Procedures:  CT angiogram chest 01/28/2024 CT head 01/27/2024 Chest x-ray 01/27/2024 2D echo 01/28/2024 pending  Antimicrobials:  Anti-infectives (From admission, onward)    Start     Dose/Rate Route Frequency Ordered Stop   01/28/24 1000  cephALEXin  (KEFLEX ) capsule 500 mg        500 mg Oral 4 times daily 01/27/24 2314           Subjective: Patient sitting up on bedside commode.  Denies any chest pain.  No significant shortness of breath.  No abdominal pain.  Asking when she can go back to her facility.  RN at bedside.  Objective: Vitals:   01/28/24 0941 01/28/24 0942 01/28/24 1216 01/28/24 1614  BP:  128/74 (!) 123/57  Pulse:   93 88  Resp:   16   Temp:   98.2 F (36.8 C) 98.3 F (36.8 C)  TempSrc:    Oral  SpO2: 91% 93% 96% 92%  Weight:      Height:        Intake/Output Summary (Last 24 hours) at 01/28/2024 1712 Last data filed at 01/28/2024 1500 Gross per 24 hour  Intake 730 ml  Output --  Net 730 ml   Filed Weights   01/28/24 0019 01/28/24 0500  Weight: 77.1 kg 76.8 kg     Examination:  General exam: Appears calm and comfortable  Respiratory system: Clear to auscultation.  No wheezes, no crackles, no rhonchi.  Fair air movement.  Speaking in full sentences.  Respiratory effort normal. Cardiovascular system: S1 & S2 heard, RRR. No JVD, murmurs, rubs, gallops or clicks. No pedal edema. Gastrointestinal system: Abdomen is nondistended, soft and nontender. No organomegaly or masses felt. Normal bowel sounds heard. Central nervous system: Alert and oriented. No focal neurological deficits. Extremities: Symmetric 5 x 5 power. Skin: Chronic bilateral lower extremity with chronic venous stasis changes  L > R.  Erythematous changes. Psychiatry: Judgement and insight appear normal. Mood & affect appropriate.     Data Reviewed: I have personally reviewed following labs and imaging studies  CBC: Recent Labs  Lab 01/27/24 1248 01/28/24 0009  WBC 8.7 10.8*  HGB 10.2* 9.3*  HCT 33.0* 29.9*  MCV 95.4 95.2  PLT 198 185    Basic Metabolic Panel: Recent Labs  Lab 01/27/24 1248 01/28/24 0009  NA 142 142  K 4.3 3.6  CL 107 105  CO2 24 25  GLUCOSE 101* 127*  BUN 21 25*  CREATININE 1.34* 1.52*  CALCIUM  8.2* 7.8*    GFR: Estimated Creatinine Clearance: 30.6 mL/min (A) (by C-G formula based on SCr of 1.52 mg/dL (H)).  Liver Function Tests: Recent Labs  Lab 01/27/24 1248  AST 47*  ALT 39  ALKPHOS 113  BILITOT 0.8  PROT 6.9  ALBUMIN  3.4*    CBG: No results for input(s): GLUCAP in the last 168 hours.   Recent Results (from the past 240 hours)  Resp panel by RT-PCR (RSV, Flu A&B, Covid) Anterior Nasal Swab     Status: None   Collection Time: 01/27/24 12:49 PM   Specimen: Anterior Nasal Swab  Result Value Ref Range Status   SARS Coronavirus 2 by RT PCR NEGATIVE NEGATIVE Final    Comment: (NOTE) SARS-CoV-2 target nucleic acids are NOT DETECTED.  The SARS-CoV-2 RNA is generally detectable in upper respiratory specimens during the acute  phase of infection. The lowest concentration of SARS-CoV-2 viral copies this assay can detect is 138 copies/mL. A negative result does not preclude SARS-Cov-2 infection and should not be used as the sole basis for treatment or other patient management decisions. A negative result may occur with  improper specimen collection/handling, submission of specimen other than nasopharyngeal swab, presence of viral mutation(s) within the areas targeted by this assay, and inadequate number of viral copies(<138 copies/mL). A negative result must be combined with clinical observations, patient history, and epidemiological information. The expected result is Negative.  Fact Sheet for Patients:  BloggerCourse.com  Fact Sheet for Healthcare Providers:  SeriousBroker.it  This test is no t yet approved or cleared by the [place]  FDA and  has been authorized for detection and/or diagnosis of SARS-CoV-2 by FDA under an Emergency Use Authorization (EUA). This EUA will remain  in effect (meaning this test can be used) for the duration of the COVID-19 declaration under Section 564(b)(1) of the Act, 21 U.S.C.section 360bbb-3(b)(1), unless the authorization is terminated  or revoked sooner.       Influenza A by PCR NEGATIVE NEGATIVE Final   Influenza B by PCR NEGATIVE NEGATIVE Final    Comment: (NOTE) The Xpert Xpress SARS-CoV-2/FLU/RSV plus assay is intended as an aid in the diagnosis of influenza from Nasopharyngeal swab specimens and should not be used as a sole basis for treatment. Nasal washings and aspirates are unacceptable for Xpert Xpress SARS-CoV-2/FLU/RSV testing.  Fact Sheet for Patients: BloggerCourse.com  Fact Sheet for Healthcare Providers: SeriousBroker.it  This test is not yet approved or cleared by the [place]  FDA and has been authorized for detection and/or diagnosis of  SARS-CoV-2 by FDA under an Emergency Use Authorization (EUA). This EUA will remain in effect (meaning this test can be used) for the duration of the COVID-19 declaration under Section 564(b)(1) of the Act, 21 U.S.C. section 360bbb-3(b)(1), unless the authorization is terminated or revoked.     Resp Syncytial Virus by PCR NEGATIVE NEGATIVE Final    Comment: (NOTE) Fact Sheet for Patients: BloggerCourse.com  Fact Sheet for Healthcare Providers: SeriousBroker.it  This test is not yet approved or cleared by the [place]  FDA and has been authorized for detection and/or diagnosis of SARS-CoV-2 by FDA under an Emergency Use Authorization (EUA). This EUA will remain in effect (meaning this test can be used) for the duration of the COVID-19 declaration under Section 564(b)(1) of the Act, 21 U.S.C. section 360bbb-3(b)(1), unless the authorization is terminated or revoked.  Performed at [facility], [address], [place], [place] [address]   Respiratory (~20 pathogens) panel by PCR     Status: None   Collection Time: 01/27/24 12:49 PM   Specimen: Nasopharyngeal Swab; Respiratory  Result Value Ref Range Status   Adenovirus NOT DETECTED NOT DETECTED Final   Coronavirus 229E NOT DETECTED NOT DETECTED Final    Comment: (NOTE) The Coronavirus on the Respiratory Panel, DOES NOT test for the novel  Coronavirus (2019 nCoV)    Coronavirus HKU1 NOT DETECTED NOT DETECTED Final   Coronavirus NL63 NOT DETECTED NOT DETECTED Final   Coronavirus OC43 NOT DETECTED NOT DETECTED Final   Metapneumovirus NOT DETECTED NOT DETECTED Final   Rhinovirus / Enterovirus NOT DETECTED NOT DETECTED Final   Influenza A NOT DETECTED NOT DETECTED Final   Influenza B NOT DETECTED NOT DETECTED Final   Parainfluenza Virus 1 NOT DETECTED NOT DETECTED Final   Parainfluenza Virus 2 NOT DETECTED NOT DETECTED Final   Parainfluenza Virus 3 NOT  DETECTED NOT DETECTED Final   Parainfluenza Virus 4 NOT DETECTED NOT DETECTED Final   Respiratory Syncytial Virus NOT DETECTED NOT DETECTED Final   Bordetella pertussis NOT DETECTED NOT DETECTED Final   Bordetella Parapertussis NOT DETECTED NOT DETECTED Final   Chlamydophila pneumoniae NOT DETECTED NOT DETECTED Final   Mycoplasma pneumoniae NOT DETECTED NOT DETECTED Final    Comment: Performed at [facility], [address] [place], [place] [address]  MRSA Next Gen by PCR, Nasal     Status: Abnormal   Collection Time: 01/28/24  2:37 AM   Specimen: Nasal Mucosa; Nasal Swab  Result Value Ref Range Status   MRSA by PCR Next Gen DETECTED (A) NOT DETECTED Final    Comment: (NOTE) The GeneXpert MRSA Assay (FDA approved for NASAL specimens only), is one component of a  comprehensive MRSA colonization surveillance program. It is not intended to diagnose MRSA infection nor to guide or monitor treatment for MRSA infections. Test performance is not FDA approved in patients less than 88 years old. Performed at [facility], [address], [place], [place] [address]   Culture, blood (Routine X 2) w Reflex to ID Panel     Status: None (Preliminary result)   Collection Time: 01/28/24 10:41 AM   Specimen: BLOOD  Result Value Ref Range Status   Specimen Description   Final    BLOOD SITE NOT SPECIFIED Performed at [facility], [address], [place], [place] [address]    Special Requests   Final    BOTTLES DRAWN AEROBIC ONLY Blood Culture results may not be optimal due to an inadequate volume of blood received in culture bottles Performed at [facility], [address], [place], [place] [address]    Culture PENDING  Incomplete   Report Status PENDING  Incomplete  Culture, blood (Routine X 2) w Reflex to ID Panel     Status: None (Preliminary result)   Collection Time: 01/28/24 10:46 AM   Specimen: BLOOD  Result Value Ref Range Status    Specimen Description   Final    BLOOD SITE NOT SPECIFIED Performed at [facility], [address] [place], [place] [address]    Special Requests   Final    BOTTLES DRAWN AEROBIC ONLY Blood Culture results may not be optimal due to an inadequate volume of blood received in culture bottles Performed at [facility], [address], [place], [place] [address]    Culture PENDING  Incomplete   Report Status PENDING  Incomplete         Radiology Studies: CT Angio Chest Pulmonary Embolism (PE) W or WO Contrast Result Date: 01/28/2024 CLINICAL DATA:  PE suspected, dyspnea and hypoxia * Tracking Code: BO * EXAM: CT ANGIOGRAPHY CHEST WITH CONTRAST TECHNIQUE: Multidetector CT imaging of the chest was performed using the standard protocol during bolus administration of intravenous contrast. Multiplanar CT image reconstructions and MIPs were obtained to evaluate the vascular anatomy. RADIATION DOSE REDUCTION: This exam was performed according to the departmental dose-optimization program which includes automated exposure control, adjustment of the mA and/or kV according to patient size and/or use of iterative reconstruction technique. CONTRAST:  60mL OMNIPAQUE  IOHEXOL  350 MG/ML SOLN COMPARISON:  11/10/2022 FINDINGS: Cardiovascular: Satisfactory opacification of the pulmonary arteries to the segmental level. No evidence of pulmonary embolism. Normal heart size. No pericardial effusion. Aortic atherosclerosis. Mediastinum/Nodes: No enlarged mediastinal, hilar, or axillary lymph nodes. Large, heterogeneous right lobe thyroid  goiter (series 4, image 9). Status post left thyroidectomy. Trachea, and esophagus demonstrate no significant findings. Lungs/Pleura: Dependent bibasilar scarring or atelectasis, unchanged compared to prior examination. No pleural effusion or pneumothorax. Upper Abdomen: No acute abnormality. Musculoskeletal: No chest wall abnormality. No acute osseous findings.  Review of the MIP images confirms the above findings. IMPRESSION: 1. Negative examination for pulmonary embolism. 2. Dependent bibasilar scarring or atelectasis, unchanged compared to prior examination. No new airspace opacity. 3. 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 Presumably there has been previous ultrasound evaluation. Aortic Atherosclerosis (ICD10-I70.0). Electronically Signed   By: Marolyn JONETTA Jaksch M.D.   On: 01/28/2024 07:10   CT Head Wo Contrast Result Date: 01/27/2024 CLINICAL DATA:  Altered mental status EXAM: CT HEAD WITHOUT CONTRAST TECHNIQUE: Contiguous axial images were obtained from the base of the skull through  the vertex without intravenous contrast. RADIATION DOSE REDUCTION: This exam was performed according to the departmental dose-optimization program which includes automated exposure control, adjustment of the mA and/or kV according to patient size and/or use of iterative reconstruction technique. COMPARISON:  05/19/2023 FINDINGS: Brain: No evidence of acute infarction, hemorrhage, hydrocephalus, extra-axial collection or mass lesion/mass effect. Extensive periventricular and deep white matter hypodensity. Vascular: No hyperdense vessel or unexpected calcification. Skull: Normal. Negative for fracture or focal lesion. Sinuses/Orbits: No acute finding. Other: None. IMPRESSION: No acute intracranial pathology. Advanced small-vessel white matter disease. Electronically Signed   By: Marolyn JONETTA Jaksch M.D.   On: 01/27/2024 15:51   DG Chest Port 1 View Result Date: 01/27/2024 CLINICAL DATA:  Shortness of breath.  Pitting edema in both legs. EXAM: PORTABLE CHEST 1 VIEW COMPARISON:  01/06/2023 and CT chest 11/10/2022. FINDINGS: Patient is rotated. Heart size stable. Lungs are low in volume with central pulmonary vascular congestion and bibasilar scarring. No pleural fluid. IMPRESSION: Low lung volumes with central pulmonary vascular congestion and bibasilar  scarring. Electronically Signed   By: Newell Eke M.D.   On: 01/27/2024 13:31        Scheduled Meds:  benztropine   0.5 mg Oral BID   cephALEXin   500 mg Oral QID   Chlorhexidine  Gluconate Cloth  6 each Topical Daily   enoxaparin  (LOVENOX ) injection  40 mg Subcutaneous Q24H   escitalopram   15 mg Oral Daily   furosemide   20 mg Oral Daily   hydrOXYzine   50 mg Oral BID   melatonin  5 mg Oral QHS   metoprolol  succinate  25 mg Oral Daily   mupirocin  ointment  1 Application Nasal BID   OLANZapine   5 mg Oral Daily   predniSONE   5 mg Oral Daily   sodium chloride  flush  3 mL Intravenous Q12H   Continuous Infusions:   LOS: 0 days    Time spent: 40 minutes    Toribio Hummer, MD [facility]   To contact the attending provider between 7A-7P or the covering provider during after hours 7P-7A, please log into the web site [email or web address] and access using universal [place] password for that web site. If you do not have the password, please call the hospital operator.  01/28/2024, 5:12 PM

## 2024-01-28 NOTE — Progress Notes (Signed)
  Echocardiogram 2D Echocardiogram has been performed.  Devora Ellouise SAUNDERS 01/28/2024, 2:46 PM

## 2024-01-28 NOTE — TOC Initial Note (Signed)
 Transition of Care [facility]) - Initial/Assessment Note    Patient Details  Name: [name] MRN: [id number] Date of Birth: Feb 27, 1946  [facility]) CM/SW Contact:    Sonda Manuella Quill, RN Phone Number: 01/28/2024, 11:21 AM  Clinical Narrative:                 TOC for d/c planning; spoke w/ pt in room; pt says she lives at [facility]; she plans to return at d/c; transport by [facility]; pt identified POC Nena Ellen (sister) [phone] (cell) / [phone] (home); she verified PCP/insurance; pt says she has wheelchair, and shower chair; she does not have HH services or home oxygen; pt says she has dentures (upper/lower), and glasses; pt says she is able to feed herself; pt also says she is continent of bowel and bladder; contacted facility [phone]) and spoke w/ Hadassah Charleston, Nursing Director; she verified pt's residency and level of care; Hadassah says pt can return at d/c if level of care does not change; she says d/c summary and FL2 will be needed; TOC is following.  [phone]- LVM for Hadassah to inquire if facility accepts admissions over weekends; awaiting return call. Expected Discharge Plan: Skilled Nursing Facility Barriers to Discharge: Continued Medical Work up   Patient Goals and CMS Choice Patient states their goals for this hospitalization and ongoing recovery are:: return to [facility] CMS Medicare.gov Compare Post Acute Care list provided to:: Patient   [place] ownership interest in [facility].provided to:: Patient    Expected Discharge Plan and Services   Discharge Planning Services: CM Consult   Living arrangements for the past 2 months: Assisted Living Facility                                      Prior Living Arrangements/Services Living arrangements for the past 2 months: Assisted Living Facility Lives with:: Facility Resident Patient language and need for interpreter  reviewed:: Yes Do you feel safe going back to the place where you live?: Yes      Need for Family Participation in Patient Care: Yes (Comment) Care giver support system in place?: Yes (comment) Current home services: DME (wheelchair, shower chair) Criminal Activity/Legal Involvement Pertinent to Current Situation/Hospitalization: No - Comment as needed  Activities of Daily Living   ADL Screening (condition at time of admission) Independently performs ADLs?: No Does the patient have a NEW difficulty with bathing/dressing/toileting/self-feeding that is expected to last >3 days?: No Does the patient have a NEW difficulty with getting in/out of bed, walking, or climbing stairs that is expected to last >3 days?: No Does the patient have a NEW difficulty with communication that is expected to last >3 days?: No Is the patient deaf or have difficulty hearing?: No Does the patient have difficulty seeing, even when wearing glasses/contacts?: No Does the patient have difficulty concentrating, remembering, or making decisions?: No  Permission Sought/Granted Permission sought to share information with : Case Manager Permission granted to share information with : Yes, Verbal Permission Granted  Share Information with NAME: Case Manager     Permission granted to share info w Relationship: Nena Ellen (sister) [phone] (cell) / [phone] (home)     Emotional Assessment Appearance:: Appears stated age Attitude/Demeanor/Rapport: Gracious Affect (typically observed): Accepting Orientation: : Oriented to Self, Oriented to Place, Oriented to  Time, Oriented to Situation  Alcohol / Substance Use: Not Applicable Psych Involvement: No (comment)  Admission diagnosis:  Dyspnea [R06.00] Hypoxia [R09.02] Patient Active Problem List   Diagnosis Date Noted   Dyspnea 01/27/2024   Abdominal pain 10/30/2022   Abdominal fluid collection 10/30/2022   H/O transurethral resection of bladder tumor  (TURBT) 10/30/2022   Bladder injury 10/30/2022   Acute renal failure superimposed on stage 3a chronic kidney disease (HCC) 10/30/2022   Cervical myelopathy (HCC) 06/14/2021   Anxiety    Chronic kidney disease, stage 3a (HCC)    Cervical stenosis of spinal canal    Hyperlipidemia associated with type 2 diabetes mellitus (HCC) 03/14/2021   Acute respiratory failure with hypoxia (HCC) 01/01/2021   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   Cognitive impairment 04/17/2019   Degeneration of lumbar intervertebral disc 10/15/2018   MDD (major depressive disorder), recurrent episode, severe (HCC) 08/10/2018   MDD (major depressive disorder), recurrent, severe, with psychosis (HCC)    Altered mental status 05/24/2018   Ketonuria 05/24/2018   Pain in right hand 09/11/2017   Essential hypertension 03/07/2014   Hyperlipidemia 03/07/2014   Depression 03/07/2014   Spinal stenosis of lumbar region with neurogenic claudication 02/24/2014   Type 2 diabetes mellitus without complication (HCC) 02/24/2014   Rheumatoid arthritis (HCC) 02/24/2014   Scoliosis of lumbar spine 02/13/2014   PCP:  Loretha Richerd SAUNDERS, MD Pharmacy:  No Pharmacies Listed    Social Drivers of Health (SDOH) Social History: SDOH Screenings   Food Insecurity: No Food Insecurity (01/28/2024)  Housing: Low Risk  (01/28/2024)  Transportation Needs: No Transportation Needs (01/28/2024)  Utilities: Not At Risk (01/28/2024)  Alcohol Screen: Low Risk  (08/10/2018)  Social Connections: Socially Isolated (01/28/2024)  Tobacco Use: Low Risk  (01/28/2024)   SDOH Interventions: Food Insecurity Interventions: Intervention Not Indicated, Inpatient TOC Housing Interventions: Intervention Not Indicated, Inpatient TOC Transportation Interventions: Intervention Not Indicated, Inpatient TOC Utilities Interventions: Intervention Not Indicated, Inpatient TOC   Readmission Risk Interventions     No data to display

## 2024-01-28 NOTE — Care Management Obs Status (Signed)
 MEDICARE OBSERVATION STATUS NOTIFICATION   Patient Details  Name: [name] MRN: [id number] Date of Birth: [birth year]   Medicare Observation Status Notification Given:  Yes    Sonda Manuella Quill, RN 01/28/2024, 11:09 AM

## 2024-01-28 NOTE — Progress Notes (Signed)
 SATURATION QUALIFICATIONS: (This note is used to comply with regulatory documentation for home oxygen)  Patient Saturations on Room Air at Rest = 95%  Patient Saturations on Room Air while transferring = 94%

## 2024-01-29 DIAGNOSIS — N1831 Chronic kidney disease, stage 3a: Secondary | ICD-10-CM | POA: Diagnosis not present

## 2024-01-29 DIAGNOSIS — M069 Rheumatoid arthritis, unspecified: Secondary | ICD-10-CM | POA: Diagnosis not present

## 2024-01-29 DIAGNOSIS — I10 Essential (primary) hypertension: Secondary | ICD-10-CM | POA: Diagnosis not present

## 2024-01-29 DIAGNOSIS — I878 Other specified disorders of veins: Secondary | ICD-10-CM | POA: Diagnosis not present

## 2024-01-29 DIAGNOSIS — F419 Anxiety disorder, unspecified: Secondary | ICD-10-CM | POA: Diagnosis not present

## 2024-01-29 DIAGNOSIS — R0609 Other forms of dyspnea: Secondary | ICD-10-CM | POA: Diagnosis not present

## 2024-01-29 DIAGNOSIS — R509 Fever, unspecified: Secondary | ICD-10-CM | POA: Diagnosis not present

## 2024-01-29 LAB — CBC
HCT: 31.1 % — ABNORMAL LOW (ref 36.0–46.0)
Hemoglobin: 9.8 g/dL — ABNORMAL LOW (ref 12.0–15.0)
MCH: 29.6 pg (ref 26.0–34.0)
MCHC: 31.5 g/dL (ref 30.0–36.0)
MCV: 94 fL (ref 80.0–100.0)
Platelets: 207 K/uL (ref 150–400)
RBC: 3.31 MIL/uL — ABNORMAL LOW (ref 3.87–5.11)
RDW: 14.3 % (ref 11.5–15.5)
WBC: 12.3 K/uL — ABNORMAL HIGH (ref 4.0–10.5)
nRBC: 0 % (ref 0.0–0.2)

## 2024-01-29 LAB — BASIC METABOLIC PANEL WITH GFR
Anion gap: 13 (ref 5–15)
BUN: 29 mg/dL — ABNORMAL HIGH (ref 8–23)
CO2: 23 mmol/L (ref 22–32)
Calcium: 7.7 mg/dL — ABNORMAL LOW (ref 8.9–10.3)
Chloride: 103 mmol/L (ref 98–111)
Creatinine, Ser: 1.5 mg/dL — ABNORMAL HIGH (ref 0.44–1.00)
GFR, Estimated: 35 mL/min — ABNORMAL LOW (ref >60)
Glucose, Bld: 128 mg/dL — ABNORMAL HIGH (ref 70–99)
Potassium: 3.5 mmol/L (ref 3.5–5.1)
Sodium: 139 mmol/L (ref 135–145)

## 2024-01-29 NOTE — TOC Progression Note (Signed)
 Transition of Care [facility]) - Progression Note   Patient Details  Name: [name] MRN: [id number] Date of Birth: 04/05/1946  [facility] CM/SW Contact  Duwaine GORMAN Aran, LCSW Phone Number: 01/29/2024, 1:06 PM  Clinical Narrative: CSW followed up with [facility] and the facility will not accept a weekend admission, so the patient will not be able to be admitted back to the facility until Monday. Hospitalist updated.  Expected Discharge Plan: Skilled Nursing Facility Barriers to Discharge: Continued Medical Work up  Expected Discharge Plan and Services Discharge Planning Services: CM Consult Living arrangements for the past 2 months: Assisted Living Facility  Social Determinants of Health (SDOH) Interventions SDOH Screenings   Food Insecurity: No Food Insecurity (01/28/2024)  Housing: Low Risk  (01/28/2024)  Transportation Needs: No Transportation Needs (01/28/2024)  Utilities: Not At Risk (01/28/2024)  Alcohol Screen: Low Risk  (08/10/2018)  Social Connections: Socially Isolated (01/28/2024)  Tobacco Use: Low Risk  (01/28/2024)   Readmission Risk Interventions     No data to display

## 2024-01-29 NOTE — Progress Notes (Signed)
 Mobility Specialist - Progress Note   01/29/24 1130  Mobility  Activity Transferred to/from [facility]  Level of Assistance Minimal assist, patient does 75% or more  Assistive Device Front wheel walker  Range of Motion/Exercises Active Assistive  Activity Response Tolerated well  Mobility Referral Yes  Mobility visit 1 Mobility  Mobility Specialist Start Time (ACUTE ONLY) 1115  Mobility Specialist Stop Time (ACUTE ONLY) 1130  Mobility Specialist Time Calculation (min) (ACUTE ONLY) 15 min   Pt was found in bed and agreeable to mobilize. Able to transfer to [facility] and do x3 STS. At EOS returned to bed with all needs met. Call bell in reach and RN notified.   Erminio Leos,  Mobility Specialist Can be reached via Secure Chat

## 2024-01-29 NOTE — Progress Notes (Signed)
 PROGRESS NOTE    [name]  FMW:[id number] DOB: 1946/06/04 DOA: 01/27/2024 PCP: Loretha Richerd SAUNDERS, MD    No chief complaint on file.   Brief Narrative:  Patient is 78 year old female with history of hypertension, CKD stage IIIa, RA on chronic prednisone , chronic anemia, chronic bilateral lower extremity edema with venous stasis depression/anxiety/mild cognitive impairment who was admitted with fever, concern for ambulatory hypoxia.   Assessment & Plan:   Principal Problem:   Dyspnea Active Problems:   Rheumatoid arthritis (HCC)   Essential hypertension   Depression   Anxiety   Chronic kidney disease, stage 3a (HCC)   Fever   Venous stasis of both lower extremities  #1 reported ambulatory hypoxia/dyspnea/fever -Patient noted initially seen and discharged from the ED after reassuring workup and sent back to SNF presenting back with reported ongoing hypoxia with sats of 84% on room air with rest and 78% on room air with ambulation. - It is noted that in the ED patient sats dropped to 90% while at rest on room air and improved on 2 L O2. - Chest x-ray done with mild central vascular congestion but no overt pulmonary edema or effusion or consolidation. - Patient noted to have a temp of 101.4 on presentation back to the ED. - BNP minimally elevated at 122.7. - SARS coronavirus PCR negative, influenza A and B PCR negative, RSV PCR negative. - Patient noted with chronic lower extremity edema however did not appear volume overloaded. - Respiratory viral panel negative. - CT angiogram chest negative for PE or infiltrate. - 2D echo with a EF of 65 to 70%, and WMA, grade 1 DD.  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  Mild MVR.   -Urinalysis nitrite negative, leukocytes negative. - Fever curve trended down. - Repeat sats with sats of 95% on room air at rest, sats of 94% on room air while transferring. -Blood cultures ordered and pending with no growth to date. - Follow.  2.  Chronic  bilateral lower extremity edema/lower extremity rubor, skin changes, suggestive of venous stasis -Noted to be longstanding in nature. - Due to presentation with fevers with temp of 101.4 on admission patient placed on oral Keflex . - Continue Lasix  20 mg daily. - Follow.  3.  CKD stage IIIa -Stable.  4.  Hypertension - Toprol -XL.   5.  Chronic normocytic anemia -H&H stable.  6.  Rheumatoid arthritis - Continue current chronic prednisone  of 5 mg daily. - Continue to hold Arava  and Plaquenil  and likely resume on discharge.  7.  Depression/anxiety -Continue Lexapro , Zyprexa , Cogentin , Xanax  twice daily as needed.  8.  Mild cognitive impairment -Stable. - Outpatient follow-up.   DVT prophylaxis: Lovenox  Code Status: Full Family Communication: Updated patient.  No family at bedside. Disposition: Back to nursing facility once afebrile for 24 to 48 hours, and if blood cultures remain negative.    Status is: Observation The patient remains OBS appropriate and will d/c before 2 midnights.   Consultants:  None  Procedures:  CT angiogram chest 01/28/2024 CT head 01/27/2024 Chest x-ray 01/27/2024 2D echo 01/28/2024   Antimicrobials:  Anti-infectives (From admission, onward)    Start     Dose/Rate Route Frequency Ordered Stop   01/28/24 1000  cephALEXin  (KEFLEX ) capsule 500 mg        500 mg Oral 4 times daily 01/27/24 2314           Subjective: Patient sitting up in bed eating her lunch.  Denies any chest pain or shortness of breath.  Denies any abdominal pain.  Asking when she is going to be able to go back to her facility.    Objective: Vitals:   01/28/24 2030 01/29/24 0500 01/29/24 0525 01/29/24 1200  BP: (!) 141/66  (!) 136/54 (!) 116/53  Pulse: 95  79 86  Resp: 20  16   Temp: 98.3 F (36.8 C)  98.4 F (36.9 C) 98.6 F (37 C)  TempSrc: Oral  Oral Oral  SpO2: 92%  93% 92%  Weight:  77.5 kg    Height:        Intake/Output Summary (Last 24 hours) at 01/29/2024  1236 Last data filed at 01/29/2024 1100 Gross per 24 hour  Intake 485 ml  Output 400 ml  Net 85 ml   Filed Weights   01/28/24 0019 01/28/24 0500 01/29/24 0500  Weight: 77.1 kg 76.8 kg 77.5 kg    Examination:  General exam: NAD. Respiratory system: Lungs clear to auscultation bilaterally.  No wheezes, no crackles, no rhonchi.  Fair air movement.  Speaking in full sentences.  Cardiovascular system: Regular rate rhythm no murmurs rubs or gallops.  No JVD.  No lower extremity edema. Gastrointestinal system: Abdomen soft, nontender, nondistended, positive bowel sounds.  No rebound.  No guarding. Central nervous system: Alert and oriented. No focal neurological deficits. Extremities: Symmetric 5 x 5 power. Skin: Chronic bilateral lower extremity with chronic venous stasis changes  L > R.  Erythematous changes. Psychiatry: Judgement and insight appear normal. Mood & affect appropriate.     Data Reviewed: I have personally reviewed following labs and imaging studies  CBC: Recent Labs  Lab 01/27/24 1248 01/28/24 0009 01/29/24 0412  WBC 8.7 10.8* 12.3*  HGB 10.2* 9.3* 9.8*  HCT 33.0* 29.9* 31.1*  MCV 95.4 95.2 94.0  PLT 198 185 207    Basic Metabolic Panel: Recent Labs  Lab 01/27/24 1248 01/28/24 0009 01/29/24 0412  NA 142 142 139  K 4.3 3.6 3.5  CL 107 105 103  CO2 24 25 23   GLUCOSE 101* 127* 128*  BUN 21 25* 29*  CREATININE 1.34* 1.52* 1.50*  CALCIUM  8.2* 7.8* 7.7*    GFR: Estimated Creatinine Clearance: 31.1 mL/min (A) (by C-G formula based on SCr of 1.5 mg/dL (H)).  Liver Function Tests: Recent Labs  Lab 01/27/24 1248  AST 47*  ALT 39  ALKPHOS 113  BILITOT 0.8  PROT 6.9  ALBUMIN  3.4*    CBG: No results for input(s): GLUCAP in the last 168 hours.   Recent Results (from the past 240 hours)  Resp panel by RT-PCR (RSV, Flu A&B, Covid) Anterior Nasal Swab     Status: None   Collection Time: 01/27/24 12:49 PM   Specimen: Anterior Nasal Swab  Result  Value Ref Range Status   SARS Coronavirus 2 by RT PCR NEGATIVE NEGATIVE Final    Comment: (NOTE) SARS-CoV-2 target nucleic acids are NOT DETECTED.  The SARS-CoV-2 RNA is generally detectable in upper respiratory specimens during the acute phase of infection. The lowest concentration of SARS-CoV-2 viral copies this assay can detect is 138 copies/mL. A negative result does not preclude SARS-Cov-2 infection and should not be used as the sole basis for treatment or other patient management decisions. A negative result may occur with  improper specimen collection/handling, submission of specimen other than nasopharyngeal swab, presence of viral mutation(s) within the areas targeted by this assay, and inadequate number of viral copies(<138 copies/mL). A negative result must be combined with clinical observations, patient history, and epidemiological  information. The expected result is Negative.  Fact Sheet for Patients:  BloggerCourse.com  Fact Sheet for Healthcare Providers:  SeriousBroker.it  This test is no t yet approved or cleared by the [place]  FDA and  has been authorized for detection and/or diagnosis of SARS-CoV-2 by FDA under an Emergency Use Authorization (EUA). This EUA will remain  in effect (meaning this test can be used) for the duration of the COVID-19 declaration under Section 564(b)(1) of the Act, 21 U.S.C.section 360bbb-3(b)(1), unless the authorization is terminated  or revoked sooner.       Influenza A by PCR NEGATIVE NEGATIVE Final   Influenza B by PCR NEGATIVE NEGATIVE Final    Comment: (NOTE) The Xpert Xpress SARS-CoV-2/FLU/RSV plus assay is intended as an aid in the diagnosis of influenza from Nasopharyngeal swab specimens and should not be used as a sole basis for treatment. Nasal washings and aspirates are unacceptable for Xpert Xpress SARS-CoV-2/FLU/RSV testing.  Fact Sheet for  Patients: BloggerCourse.com  Fact Sheet for Healthcare Providers: SeriousBroker.it  This test is not yet approved or cleared by the [place]  FDA and has been authorized for detection and/or diagnosis of SARS-CoV-2 by FDA under an Emergency Use Authorization (EUA). This EUA will remain in effect (meaning this test can be used) for the duration of the COVID-19 declaration under Section 564(b)(1) of the Act, 21 U.S.C. section 360bbb-3(b)(1), unless the authorization is terminated or revoked.     Resp Syncytial Virus by PCR NEGATIVE NEGATIVE Final    Comment: (NOTE) Fact Sheet for Patients: BloggerCourse.com  Fact Sheet for Healthcare Providers: SeriousBroker.it  This test is not yet approved or cleared by the [place]  FDA and has been authorized for detection and/or diagnosis of SARS-CoV-2 by FDA under an Emergency Use Authorization (EUA). This EUA will remain in effect (meaning this test can be used) for the duration of the COVID-19 declaration under Section 564(b)(1) of the Act, 21 U.S.C. section 360bbb-3(b)(1), unless the authorization is terminated or revoked.  Performed at [facility], [address] [place], [place] [address]   Respiratory (~20 pathogens) panel by PCR     Status: None   Collection Time: 01/27/24 12:49 PM   Specimen: Nasopharyngeal Swab; Respiratory  Result Value Ref Range Status   Adenovirus NOT DETECTED NOT DETECTED Final   Coronavirus 229E NOT DETECTED NOT DETECTED Final    Comment: (NOTE) The Coronavirus on the Respiratory Panel, DOES NOT test for the novel  Coronavirus (2019 nCoV)    Coronavirus HKU1 NOT DETECTED NOT DETECTED Final   Coronavirus NL63 NOT DETECTED NOT DETECTED Final   Coronavirus OC43 NOT DETECTED NOT DETECTED Final   Metapneumovirus NOT DETECTED NOT DETECTED Final   Rhinovirus / Enterovirus NOT  DETECTED NOT DETECTED Final   Influenza A NOT DETECTED NOT DETECTED Final   Influenza B NOT DETECTED NOT DETECTED Final   Parainfluenza Virus 1 NOT DETECTED NOT DETECTED Final   Parainfluenza Virus 2 NOT DETECTED NOT DETECTED Final   Parainfluenza Virus 3 NOT DETECTED NOT DETECTED Final   Parainfluenza Virus 4 NOT DETECTED NOT DETECTED Final   Respiratory Syncytial Virus NOT DETECTED NOT DETECTED Final   Bordetella pertussis NOT DETECTED NOT DETECTED Final   Bordetella Parapertussis NOT DETECTED NOT DETECTED Final   Chlamydophila pneumoniae NOT DETECTED NOT DETECTED Final   Mycoplasma pneumoniae NOT DETECTED NOT DETECTED Final    Comment: Performed at [facility], [address], [place], [place] [address]  MRSA Next Gen by PCR,  Nasal     Status: Abnormal   Collection Time: 01/28/24  2:37 AM   Specimen: Nasal Mucosa; Nasal Swab  Result Value Ref Range Status   MRSA by PCR Next Gen DETECTED (A) NOT DETECTED Final    Comment: (NOTE) The GeneXpert MRSA Assay (FDA approved for NASAL specimens only), is one component of a comprehensive MRSA colonization surveillance program. It is not intended to diagnose MRSA infection nor to guide or monitor treatment for MRSA infections. Test performance is not FDA approved in patients less than 23 years old. Performed at [facility], [address], [place], [place] [address]   Culture, blood (Routine X 2) w Reflex to ID Panel     Status: None (Preliminary result)   Collection Time: 01/28/24 10:41 AM   Specimen: BLOOD  Result Value Ref Range Status   Specimen Description   Final    BLOOD SITE NOT SPECIFIED Performed at [facility], [address] [place], [place] [address]    Special Requests   Final    BOTTLES DRAWN AEROBIC ONLY Blood Culture results may not be optimal due to an inadequate volume of blood received in culture bottles Performed at [facility], [address], [place],  [place] [address]    Culture   Final    NO GROWTH < 24 HOURS Performed at [facility], [address], [place], [place] [address]    Report Status PENDING  Incomplete  Culture, blood (Routine X 2) w Reflex to ID Panel     Status: None (Preliminary result)   Collection Time: 01/28/24 10:46 AM   Specimen: BLOOD  Result Value Ref Range Status   Specimen Description   Final    BLOOD SITE NOT SPECIFIED Performed at [facility], [address] [place], [place] [address]    Special Requests   Final    BOTTLES DRAWN AEROBIC ONLY Blood Culture results may not be optimal due to an inadequate volume of blood received in culture bottles Performed at [facility], [address], [place], [place] [address]    Culture   Final    NO GROWTH < 24 HOURS Performed at [facility], [address] [place], [place] [address]    Report Status PENDING  Incomplete         Radiology Studies: ECHOCARDIOGRAM COMPLETE Result Date: 01/28/2024    ECHOCARDIOGRAM REPORT   Patient Name:   [name] [facility] Date of Exam: 01/28/2024 Medical Rec #:  [id number]          Height:       64.0 in Accession #:    [id number]         Weight:       169.3 lb Date of Birth:  1945-07-19          BSA:          1.823 m Patient Age:    78 years           BP:           127/60 mmHg Patient Gender: F                  HR:           86 bpm. Exam Location:  Inpatient Procedure: 2D Echo, Cardiac Doppler and Color Doppler (Both Spectral and Color            Flow Doppler were utilized during  procedure). Indications:    R06.02 SOB  History:        Patient has prior history of Echocardiogram examinations, most                 recent 12/28/2020. Signs/Symptoms:Altered Mental Status; Risk                 Factors:Hypertension, Diabetes and Dyslipidemia.  Sonographer:    Ellouise Mose RDCS Referring Phys: [id number] VISHAL R PATEL  Sonographer Comments: Patient with restrictive clothing IMPRESSIONS  1. Left ventricular ejection  fraction, by estimation, is 65 to 70%. The left ventricle has normal function. The left ventricle has no regional wall motion abnormalities. Left ventricular diastolic parameters are consistent with Grade I diastolic dysfunction (impaired relaxation).  2. Right ventricular systolic function is normal. The right ventricular size is normal. There is mildly elevated pulmonary artery systolic pressure. The estimated right ventricular systolic pressure is 41.2 mmHg.  3. The mitral valve is normal in structure. Mild mitral valve regurgitation. No evidence of mitral stenosis.  4. The aortic valve is tricuspid. Aortic valve regurgitation is mild to moderate. No aortic stenosis is present.  5. The inferior vena cava is dilated in size with <50% respiratory variability, suggesting right atrial pressure of 15 mmHg. FINDINGS  Left Ventricle: Left ventricular ejection fraction, by estimation, is 65 to 70%. The left ventricle has normal function. The left ventricle has no regional wall motion abnormalities. The left ventricular internal cavity size was normal in size. There is  no left ventricular hypertrophy. Left ventricular diastolic parameters are consistent with Grade I diastolic dysfunction (impaired relaxation). Right Ventricle: The right ventricular size is normal. No increase in right ventricular wall thickness. Right ventricular systolic function is normal. There is mildly elevated pulmonary artery systolic pressure. The tricuspid regurgitant velocity is 2.56  m/s, and with an assumed right atrial pressure of 15 mmHg, the estimated right ventricular systolic pressure is 41.2 mmHg. Left Atrium: Left atrial size was normal in size. Right Atrium: Right atrial size was normal in size. Pericardium: There is no evidence of pericardial effusion. Mitral Valve: The mitral valve is normal in structure. Mild mitral valve regurgitation. No evidence of mitral valve stenosis. Tricuspid Valve: The tricuspid valve is normal in  structure. Tricuspid valve regurgitation is trivial. No evidence of tricuspid stenosis. Aortic Valve: The aortic valve is tricuspid. Aortic valve regurgitation is mild to moderate. Aortic regurgitation PHT measures 315 msec. No aortic stenosis is present. Pulmonic Valve: The pulmonic valve was normal in structure. Pulmonic valve regurgitation is trivial. No evidence of pulmonic stenosis. Aorta: The aortic root and ascending aorta are structurally normal, with no evidence of dilitation. Venous: The inferior vena cava is dilated in size with less than 50% respiratory variability, suggesting right atrial pressure of 15 mmHg. IAS/Shunts: No atrial level shunt detected by color flow Doppler.  LEFT VENTRICLE PLAX 2D LVIDd:         3.80 cm     Diastology LVIDs:         2.20 cm     LV e' medial:    6.74 cm/s LV PW:         1.10 cm     LV E/e' medial:  12.5 LV IVS:        1.00 cm     LV e' lateral:   10.10 cm/s LVOT diam:     2.30 cm     LV E/e' lateral: 8.4 LV SV:  83 LV SV Index:   45 LVOT Area:     4.15 cm  LV Volumes (MOD) LV vol d, MOD A2C: 77.5 ml LV vol d, MOD A4C: 72.8 ml LV vol s, MOD A2C: 22.9 ml LV vol s, MOD A4C: 22.5 ml LV SV MOD A2C:     54.6 ml LV SV MOD A4C:     72.8 ml LV SV MOD BP:      55.4 ml RIGHT VENTRICLE             IVC RV S prime:     13.60 cm/s  IVC diam: 2.40 cm TAPSE (M-mode): 1.9 cm LEFT ATRIUM             Index        RIGHT ATRIUM           Index LA diam:        4.00 cm 2.19 cm/m   RA Area:     11.90 cm LA Vol (A2C):   29.2 ml 16.02 ml/m  RA Volume:   25.80 ml  14.16 ml/m LA Vol (A4C):   23.1 ml 12.67 ml/m LA Biplane Vol: 25.8 ml 14.16 ml/m  AORTIC VALVE LVOT Vmax:   116.00 cm/s LVOT Vmean:  75.800 cm/s LVOT VTI:    0.199 m AI PHT:      315 msec  AORTA Ao Root diam: 2.70 cm Ao Asc diam:  3.10 cm MITRAL VALVE               TRICUSPID VALVE MV Area (PHT): 4.49 cm    TR Peak grad:   26.2 mmHg MV Decel Time: 169 msec    TR Vmax:        256.00 cm/s MV E velocity: 84.50 cm/s MV A  velocity: 93.00 cm/s  SHUNTS MV E/A ratio:  0.91        Systemic VTI:  0.20 m                            Systemic Diam: 2.30 cm Morene Brownie Electronically signed by Morene Brownie Signature Date/Time: 01/28/2024/6:07:32 PM    Final    CT Angio Chest Pulmonary Embolism (PE) W or WO Contrast Result Date: 01/28/2024 CLINICAL DATA:  PE suspected, dyspnea and hypoxia * Tracking Code: BO * EXAM: CT ANGIOGRAPHY CHEST WITH CONTRAST TECHNIQUE: Multidetector CT imaging of the chest was performed using the standard protocol during bolus administration of intravenous contrast. Multiplanar CT image reconstructions and MIPs were obtained to evaluate the vascular anatomy. RADIATION DOSE REDUCTION: This exam was performed according to the departmental dose-optimization program which includes automated exposure control, adjustment of the mA and/or kV according to patient size and/or use of iterative reconstruction technique. CONTRAST:  60mL OMNIPAQUE  IOHEXOL  350 MG/ML SOLN COMPARISON:  11/10/2022 FINDINGS: Cardiovascular: Satisfactory opacification of the pulmonary arteries to the segmental level. No evidence of pulmonary embolism. Normal heart size. No pericardial effusion. Aortic atherosclerosis. Mediastinum/Nodes: No enlarged mediastinal, hilar, or axillary lymph nodes. Large, heterogeneous right lobe thyroid  goiter (series 4, image 9). Status post left thyroidectomy. Trachea, and esophagus demonstrate no significant findings. Lungs/Pleura: Dependent bibasilar scarring or atelectasis, unchanged compared to prior examination. No pleural effusion or pneumothorax. Upper Abdomen: No acute abnormality. Musculoskeletal: No chest wall abnormality. No acute osseous findings. Review of the MIP images confirms the above findings. IMPRESSION: 1. Negative examination for pulmonary embolism. 2. Dependent bibasilar scarring or atelectasis, unchanged compared to prior examination. No  new airspace opacity. 3. Large, heterogeneous right  lobe thyroid  goiter. Status post left thyroidectomy. Presumably there has been previous ultrasound evaluation. Aortic Atherosclerosis (ICD10-I70.0). Electronically Signed   By: Marolyn JONETTA Jaksch M.D.   On: 01/28/2024 07:10   CT Head Wo Contrast Result Date: 01/27/2024 CLINICAL DATA:  Altered mental status EXAM: CT HEAD WITHOUT CONTRAST TECHNIQUE: Contiguous axial images were obtained from the base of the skull through the vertex without intravenous contrast. RADIATION DOSE REDUCTION: This exam was performed according to the departmental dose-optimization program which includes automated exposure control, adjustment of the mA and/or kV according to patient size and/or use of iterative reconstruction technique. COMPARISON:  05/19/2023 FINDINGS: Brain: No evidence of acute infarction, hemorrhage, hydrocephalus, extra-axial collection or mass lesion/mass effect. Extensive periventricular and deep white matter hypodensity. Vascular: No hyperdense vessel or unexpected calcification. Skull: Normal. Negative for fracture or focal lesion. Sinuses/Orbits: No acute finding. Other: None. IMPRESSION: No acute intracranial pathology. Advanced small-vessel white matter disease. Electronically Signed   By: Marolyn JONETTA Jaksch M.D.   On: 01/27/2024 15:51   DG Chest Port 1 View Result Date: 01/27/2024 CLINICAL DATA:  Shortness of breath.  Pitting edema in both legs. EXAM: PORTABLE CHEST 1 VIEW COMPARISON:  01/06/2023 and CT chest 11/10/2022. FINDINGS: Patient is rotated. Heart size stable. Lungs are low in volume with central pulmonary vascular congestion and bibasilar scarring. No pleural fluid. IMPRESSION: Low lung volumes with central pulmonary vascular congestion and bibasilar scarring. Electronically Signed   By: Newell Eke M.D.   On: 01/27/2024 13:31        Scheduled Meds:  benztropine   0.5 mg Oral BID   cephALEXin   500 mg Oral QID   Chlorhexidine  Gluconate Cloth  6 each Topical Daily   enoxaparin  (LOVENOX )  injection  40 mg Subcutaneous Q24H   escitalopram   15 mg Oral Daily   furosemide   20 mg Oral Daily   hydrOXYzine   50 mg Oral BID   melatonin  5 mg Oral QHS   metoprolol  succinate  25 mg Oral Daily   mupirocin  ointment  1 Application Nasal BID   OLANZapine   5 mg Oral Daily   predniSONE   5 mg Oral Daily   sodium chloride  flush  3 mL Intravenous Q12H   Continuous Infusions:   LOS: 0 days    Time spent: 40 minutes    Toribio Hummer, MD [facility]   To contact the attending provider between 7A-7P or the covering provider during after hours 7P-7A, please log into the web site [email or web address] and access using universal [place] password for that web site. If you do not have the password, please call the hospital operator.  01/29/2024, 12:36 PM

## 2024-01-30 DIAGNOSIS — R0609 Other forms of dyspnea: Secondary | ICD-10-CM | POA: Diagnosis not present

## 2024-01-30 DIAGNOSIS — F419 Anxiety disorder, unspecified: Secondary | ICD-10-CM | POA: Diagnosis not present

## 2024-01-30 DIAGNOSIS — M069 Rheumatoid arthritis, unspecified: Secondary | ICD-10-CM | POA: Diagnosis not present

## 2024-01-30 DIAGNOSIS — R509 Fever, unspecified: Secondary | ICD-10-CM | POA: Diagnosis not present

## 2024-01-30 LAB — CBC
HCT: 31.7 % — ABNORMAL LOW (ref 36.0–46.0)
Hemoglobin: 9.6 g/dL — ABNORMAL LOW (ref 12.0–15.0)
MCH: 28.9 pg (ref 26.0–34.0)
MCHC: 30.3 g/dL (ref 30.0–36.0)
MCV: 95.5 fL (ref 80.0–100.0)
Platelets: 203 K/uL (ref 150–400)
RBC: 3.32 MIL/uL — ABNORMAL LOW (ref 3.87–5.11)
RDW: 14.5 % (ref 11.5–15.5)
WBC: 8.2 K/uL (ref 4.0–10.5)
nRBC: 0 % (ref 0.0–0.2)

## 2024-01-30 LAB — BASIC METABOLIC PANEL WITH GFR
Anion gap: 12 (ref 5–15)
BUN: 30 mg/dL — ABNORMAL HIGH (ref 8–23)
CO2: 23 mmol/L (ref 22–32)
Calcium: 7.6 mg/dL — ABNORMAL LOW (ref 8.9–10.3)
Chloride: 106 mmol/L (ref 98–111)
Creatinine, Ser: 1.49 mg/dL — ABNORMAL HIGH (ref 0.44–1.00)
GFR, Estimated: 36 mL/min — ABNORMAL LOW (ref >60)
Glucose, Bld: 94 mg/dL (ref 70–99)
Potassium: 3.9 mmol/L (ref 3.5–5.1)
Sodium: 141 mmol/L (ref 135–145)

## 2024-01-30 NOTE — Progress Notes (Signed)
 PROGRESS NOTE    [name]  FMW:[id number] DOB: May 26, 1946 DOA: 01/27/2024 PCP: Loretha Richerd SAUNDERS, MD    No chief complaint on file.   Brief Narrative:  Patient is 78 year old female with history of hypertension, CKD stage IIIa, RA on chronic prednisone , chronic anemia, chronic bilateral lower extremity edema with venous stasis depression/anxiety/mild cognitive impairment who was admitted with fever, concern for ambulatory hypoxia.   Assessment & Plan:   Principal Problem:   Dyspnea Active Problems:   Rheumatoid arthritis (HCC)   Essential hypertension   Depression   Anxiety   Chronic kidney disease, stage 3a (HCC)   Fever   Venous stasis of both lower extremities  #1 reported ambulatory hypoxia/dyspnea/fever -Patient noted initially seen and discharged from the ED after reassuring workup and sent back to SNF presenting back with reported ongoing hypoxia with sats of 84% on room air with rest and 78% on room air with ambulation. - It is noted that in the ED patient sats dropped to 90% while at rest on room air and improved on 2 L O2. - Chest x-ray done with mild central vascular congestion but no overt pulmonary edema or effusion or consolidation. - Patient noted to have a temp of 101.4 on presentation back to the ED. - BNP minimally elevated at 122.7. - SARS coronavirus PCR negative, influenza A and B PCR negative, RSV PCR negative. - Patient noted with chronic lower extremity edema however did not appear volume overloaded. - Respiratory viral panel negative. - CT angiogram chest negative for PE or infiltrate. - 2D echo with a EF of 65 to 70%, and WMA, grade 1 DD.  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  Mild MVR.   -Urinalysis nitrite negative, leukocytes negative. - Fever curve trended down. -Patient afebrile since admission. - Repeat sats with sats of 95% on room air at rest, sats of 94% on room air while transferring. -Blood cultures ordered and pending with no growth to  date. - Follow.  2.  Chronic bilateral lower extremity edema/lower extremity rubor, skin changes, suggestive of venous stasis -Noted to be longstanding in nature. - Due to presentation with fevers with temp of 101.4 on admission patient placed on oral Keflex . - Continue Lasix  20 mg daily. - Follow.  3.  CKD stage IIIa -Stable.  4.  Hypertension - Continue Toprol -XL.   5.  Chronic normocytic anemia -H&H stable.  6.  Rheumatoid arthritis - Continue current chronic prednisone  of 5 mg daily. - Continue to hold Arava  and Plaquenil  and likely resume on discharge.  7.  Depression/anxiety - Continue Zyprexa , Cogentin , Lexapro .   -Xanax  as needed  8.  Mild cognitive impairment -Stable. - Outpatient follow-up.   DVT prophylaxis: Lovenox  Code Status: Full Family Communication: Updated patient.  No family at bedside. Disposition: Patient medically stable as of 01/30/2024.  Awaiting return back to SNF (per [facility] SN[facility] not accepting admissions over the weekend)    Status is: Observation The patient remains OBS appropriate and will d/c before 2 midnights.   Consultants:  None  Procedures:  CT angiogram chest 01/28/2024 CT head 01/27/2024 Chest x-ray 01/27/2024 2D echo 01/28/2024   Antimicrobials:  Anti-infectives (From admission, onward)    Start     Dose/Rate Route Frequency Ordered Stop   01/28/24 1000  cephALEXin  (KEFLEX ) capsule 500 mg        500 mg Oral 4 times daily 01/27/24 2314           Subjective: Patient sitting up in bed.  Denies  any chest pain or shortness of breath.  No abdominal pain.  Asking when she is going to be able to go back to her facility.  Objective: Vitals:   01/29/24 1955 01/30/24 0500 01/30/24 0523 01/30/24 1044  BP: (!) 128/51  135/69 134/82  Pulse: 86  79 90  Resp: 16  18   Temp: 98.6 F (37 C)  98.6 F (37 C)   TempSrc: Oral  Oral   SpO2: 94%  94%   Weight:  77.4 kg    Height:        Intake/Output Summary (Last 24 hours) at 01/30/2024  1228 Last data filed at 01/30/2024 1100 Gross per 24 hour  Intake 270 ml  Output 2350 ml  Net -2080 ml   Filed Weights   01/28/24 0500 01/29/24 0500 01/30/24 0500  Weight: 76.8 kg 77.5 kg 77.4 kg    Examination:  General exam: NAD. Respiratory system: CTAB.  No wheezes, no crackles, no rhonchi.  Fair air movement.  Speaking in full sentences.   Cardiovascular system: RRR no murmurs rubs or gallops.  No JVD.  No lower extremity edema.  Gastrointestinal system: Abdomen is soft, nontender, nondistended, positive bowel sounds.  No rebound.  No guarding.  Central nervous system: Alert and oriented. No focal neurological deficits. Extremities: Symmetric 5 x 5 power. Skin: Chronic bilateral lower extremity with chronic venous stasis changes  L > R.  Erythematous changes. Psychiatry: Judgement and insight appear normal. Mood & affect appropriate.     Data Reviewed: I have personally reviewed following labs and imaging studies  CBC: Recent Labs  Lab 01/27/24 1248 01/28/24 0009 01/29/24 0412 01/30/24 0429  WBC 8.7 10.8* 12.3* 8.2  HGB 10.2* 9.3* 9.8* 9.6*  HCT 33.0* 29.9* 31.1* 31.7*  MCV 95.4 95.2 94.0 95.5  PLT 198 185 207 203    Basic Metabolic Panel: Recent Labs  Lab 01/27/24 1248 01/28/24 0009 01/29/24 0412 01/30/24 0429  NA 142 142 139 141  K 4.3 3.6 3.5 3.9  CL 107 105 103 106  CO2 24 25 23 23   GLUCOSE 101* 127* 128* 94  BUN 21 25* 29* 30*  CREATININE 1.34* 1.52* 1.50* 1.49*  CALCIUM  8.2* 7.8* 7.7* 7.6*    GFR: Estimated Creatinine Clearance: 31.3 mL/min (A) (by C-G formula based on SCr of 1.49 mg/dL (H)).  Liver Function Tests: Recent Labs  Lab 01/27/24 1248  AST 47*  ALT 39  ALKPHOS 113  BILITOT 0.8  PROT 6.9  ALBUMIN  3.4*    CBG: No results for input(s): GLUCAP in the last 168 hours.   Recent Results (from the past 240 hours)  Resp panel by RT-PCR (RSV, Flu A&B, Covid) Anterior Nasal Swab     Status: None   Collection Time: 01/27/24  12:49 PM   Specimen: Anterior Nasal Swab  Result Value Ref Range Status   SARS Coronavirus 2 by RT PCR NEGATIVE NEGATIVE Final    Comment: (NOTE) SARS-CoV-2 target nucleic acids are NOT DETECTED.  The SARS-CoV-2 RNA is generally detectable in upper respiratory specimens during the acute phase of infection. The lowest concentration of SARS-CoV-2 viral copies this assay can detect is 138 copies/mL. A negative result does not preclude SARS-Cov-2 infection and should not be used as the sole basis for treatment or other patient management decisions. A negative result may occur with  improper specimen collection/handling, submission of specimen other than nasopharyngeal swab, presence of viral mutation(s) within the areas targeted by this assay, and inadequate number of viral  copies(<138 copies/mL). A negative result must be combined with clinical observations, patient history, and epidemiological information. The expected result is Negative.  Fact Sheet for Patients:  BloggerCourse.com  Fact Sheet for Healthcare Providers:  SeriousBroker.it  This test is no t yet approved or cleared by the [place]  FDA and  has been authorized for detection and/or diagnosis of SARS-CoV-2 by FDA under an Emergency Use Authorization (EUA). This EUA will remain  in effect (meaning this test can be used) for the duration of the COVID-19 declaration under Section 564(b)(1) of the Act, 21 U.S.C.section 360bbb-3(b)(1), unless the authorization is terminated  or revoked sooner.       Influenza A by PCR NEGATIVE NEGATIVE Final   Influenza B by PCR NEGATIVE NEGATIVE Final    Comment: (NOTE) The Xpert Xpress SARS-CoV-2/FLU/RSV plus assay is intended as an aid in the diagnosis of influenza from Nasopharyngeal swab specimens and should not be used as a sole basis for treatment. Nasal washings and aspirates are unacceptable for Xpert Xpress  SARS-CoV-2/FLU/RSV testing.  Fact Sheet for Patients: BloggerCourse.com  Fact Sheet for Healthcare Providers: SeriousBroker.it  This test is not yet approved or cleared by the [place]  FDA and has been authorized for detection and/or diagnosis of SARS-CoV-2 by FDA under an Emergency Use Authorization (EUA). This EUA will remain in effect (meaning this test can be used) for the duration of the COVID-19 declaration under Section 564(b)(1) of the Act, 21 U.S.C. section 360bbb-3(b)(1), unless the authorization is terminated or revoked.     Resp Syncytial Virus by PCR NEGATIVE NEGATIVE Final    Comment: (NOTE) Fact Sheet for Patients: BloggerCourse.com  Fact Sheet for Healthcare Providers: SeriousBroker.it  This test is not yet approved or cleared by the [place]  FDA and has been authorized for detection and/or diagnosis of SARS-CoV-2 by FDA under an Emergency Use Authorization (EUA). This EUA will remain in effect (meaning this test can be used) for the duration of the COVID-19 declaration under Section 564(b)(1) of the Act, 21 U.S.C. section 360bbb-3(b)(1), unless the authorization is terminated or revoked.  Performed at [facility], [address], [place], [place] [address]   Respiratory (~20 pathogens) panel by PCR     Status: None   Collection Time: 01/27/24 12:49 PM   Specimen: Nasopharyngeal Swab; Respiratory  Result Value Ref Range Status   Adenovirus NOT DETECTED NOT DETECTED Final   Coronavirus 229E NOT DETECTED NOT DETECTED Final    Comment: (NOTE) The Coronavirus on the Respiratory Panel, DOES NOT test for the novel  Coronavirus (2019 nCoV)    Coronavirus HKU1 NOT DETECTED NOT DETECTED Final   Coronavirus NL63 NOT DETECTED NOT DETECTED Final   Coronavirus OC43 NOT DETECTED NOT DETECTED Final   Metapneumovirus NOT DETECTED NOT  DETECTED Final   Rhinovirus / Enterovirus NOT DETECTED NOT DETECTED Final   Influenza A NOT DETECTED NOT DETECTED Final   Influenza B NOT DETECTED NOT DETECTED Final   Parainfluenza Virus 1 NOT DETECTED NOT DETECTED Final   Parainfluenza Virus 2 NOT DETECTED NOT DETECTED Final   Parainfluenza Virus 3 NOT DETECTED NOT DETECTED Final   Parainfluenza Virus 4 NOT DETECTED NOT DETECTED Final   Respiratory Syncytial Virus NOT DETECTED NOT DETECTED Final   Bordetella pertussis NOT DETECTED NOT DETECTED Final   Bordetella Parapertussis NOT DETECTED NOT DETECTED Final   Chlamydophila pneumoniae NOT DETECTED NOT DETECTED Final   Mycoplasma pneumoniae NOT DETECTED NOT DETECTED Final    Comment: Performed at [facility], [address], [place], [place] [address]  MRSA Next Gen by PCR, Nasal     Status: Abnormal   Collection Time: 01/28/24  2:37 AM   Specimen: Nasal Mucosa; Nasal Swab  Result Value Ref Range Status   MRSA by PCR Next Gen DETECTED (A) NOT DETECTED Final    Comment: (NOTE) The GeneXpert MRSA Assay (FDA approved for NASAL specimens only), is one component of a comprehensive MRSA colonization surveillance program. It is not intended to diagnose MRSA infection nor to guide or monitor treatment for MRSA infections. Test performance is not FDA approved in patients less than 77 years old. Performed at [facility], [address], [place], [place] [address]   Culture, blood (Routine X 2) w Reflex to ID Panel     Status: None (Preliminary result)   Collection Time: 01/28/24 10:41 AM   Specimen: BLOOD  Result Value Ref Range Status   Specimen Description   Final    BLOOD SITE NOT SPECIFIED Performed at [facility], [address], [place], [place] [address]    Special Requests   Final    BOTTLES DRAWN AEROBIC ONLY Blood Culture results may not be optimal due to an inadequate volume of blood received in culture bottles Performed at [facility], [address], [place], [place] [address]    Culture   Final    NO GROWTH 2 DAYS Performed at [facility], [address], [place], [place] [address]    Report Status PENDING  Incomplete  Culture, blood (Routine X 2) w Reflex to ID Panel     Status: None (Preliminary result)   Collection Time: 01/28/24 10:46 AM   Specimen: BLOOD  Result Value Ref Range Status   Specimen Description   Final    BLOOD SITE NOT SPECIFIED Performed at [facility], [address], [place], [place] [address]    Special Requests   Final    BOTTLES DRAWN AEROBIC ONLY Blood Culture results may not be optimal due to an inadequate volume of blood received in culture bottles Performed at [facility], [address] [place], [place] [address]    Culture   Final    NO GROWTH 2 DAYS Performed at [facility], [address] [place], [place] [address]    Report Status PENDING  Incomplete         Radiology Studies: ECHOCARDIOGRAM COMPLETE Result Date: 01/28/2024    ECHOCARDIOGRAM REPORT   Patient Name:   MEHER KUCINSKI [facility] Date of Exam: 01/28/2024 Medical Rec #:  [id number]          Height:       64.0 in Accession #:    [id number]         Weight:       169.3 lb Date of Birth:  1946/02/23          BSA:          1.823 m Patient Age:    78 years           BP:           127/60 mmHg Patient Gender: F                  HR:           86 bpm. Exam Location:  Inpatient Procedure: 2D Echo, Cardiac Doppler and Color Doppler (Both Spectral and Color  Flow Doppler were utilized during procedure). Indications:    R06.02 SOB  History:        Patient has prior history of Echocardiogram examinations, most                 recent 12/28/2020. Signs/Symptoms:Altered Mental Status; Risk                 Factors:Hypertension, Diabetes and Dyslipidemia.  Sonographer:    Ellouise Mose RDCS Referring Phys: [id number] VISHAL R PATEL  Sonographer Comments: Patient with restrictive  clothing IMPRESSIONS  1. Left ventricular ejection fraction, by estimation, is 65 to 70%. The left ventricle has normal function. The left ventricle has no regional wall motion abnormalities. Left ventricular diastolic parameters are consistent with Grade I diastolic dysfunction (impaired relaxation).  2. Right ventricular systolic function is normal. The right ventricular size is normal. There is mildly elevated pulmonary artery systolic pressure. The estimated right ventricular systolic pressure is 41.2 mmHg.  3. The mitral valve is normal in structure. Mild mitral valve regurgitation. No evidence of mitral stenosis.  4. The aortic valve is tricuspid. Aortic valve regurgitation is mild to moderate. No aortic stenosis is present.  5. The inferior vena cava is dilated in size with <50% respiratory variability, suggesting right atrial pressure of 15 mmHg. FINDINGS  Left Ventricle: Left ventricular ejection fraction, by estimation, is 65 to 70%. The left ventricle has normal function. The left ventricle has no regional wall motion abnormalities. The left ventricular internal cavity size was normal in size. There is  no left ventricular hypertrophy. Left ventricular diastolic parameters are consistent with Grade I diastolic dysfunction (impaired relaxation). Right Ventricle: The right ventricular size is normal. No increase in right ventricular wall thickness. Right ventricular systolic function is normal. There is mildly elevated pulmonary artery systolic pressure. The tricuspid regurgitant velocity is 2.56  m/s, and with an assumed right atrial pressure of 15 mmHg, the estimated right ventricular systolic pressure is 41.2 mmHg. Left Atrium: Left atrial size was normal in size. Right Atrium: Right atrial size was normal in size. Pericardium: There is no evidence of pericardial effusion. Mitral Valve: The mitral valve is normal in structure. Mild mitral valve regurgitation. No evidence of mitral valve stenosis.  Tricuspid Valve: The tricuspid valve is normal in structure. Tricuspid valve regurgitation is trivial. No evidence of tricuspid stenosis. Aortic Valve: The aortic valve is tricuspid. Aortic valve regurgitation is mild to moderate. Aortic regurgitation PHT measures 315 msec. No aortic stenosis is present. Pulmonic Valve: The pulmonic valve was normal in structure. Pulmonic valve regurgitation is trivial. No evidence of pulmonic stenosis. Aorta: The aortic root and ascending aorta are structurally normal, with no evidence of dilitation. Venous: The inferior vena cava is dilated in size with less than 50% respiratory variability, suggesting right atrial pressure of 15 mmHg. IAS/Shunts: No atrial level shunt detected by color flow Doppler.  LEFT VENTRICLE PLAX 2D LVIDd:         3.80 cm     Diastology LVIDs:         2.20 cm     LV e' medial:    6.74 cm/s LV PW:         1.10 cm     LV E/e' medial:  12.5 LV IVS:        1.00 cm     LV e' lateral:   10.10 cm/s LVOT diam:     2.30 cm     LV E/e' lateral: 8.4 LV SV:  83 LV SV Index:   45 LVOT Area:     4.15 cm  LV Volumes (MOD) LV vol d, MOD A2C: 77.5 ml LV vol d, MOD A4C: 72.8 ml LV vol s, MOD A2C: 22.9 ml LV vol s, MOD A4C: 22.5 ml LV SV MOD A2C:     54.6 ml LV SV MOD A4C:     72.8 ml LV SV MOD BP:      55.4 ml RIGHT VENTRICLE             IVC RV S prime:     13.60 cm/s  IVC diam: 2.40 cm TAPSE (M-mode): 1.9 cm LEFT ATRIUM             Index        RIGHT ATRIUM           Index LA diam:        4.00 cm 2.19 cm/m   RA Area:     11.90 cm LA Vol (A2C):   29.2 ml 16.02 ml/m  RA Volume:   25.80 ml  14.16 ml/m LA Vol (A4C):   23.1 ml 12.67 ml/m LA Biplane Vol: 25.8 ml 14.16 ml/m  AORTIC VALVE LVOT Vmax:   116.00 cm/s LVOT Vmean:  75.800 cm/s LVOT VTI:    0.199 m AI PHT:      315 msec  AORTA Ao Root diam: 2.70 cm Ao Asc diam:  3.10 cm MITRAL VALVE               TRICUSPID VALVE MV Area (PHT): 4.49 cm    TR Peak grad:   26.2 mmHg MV Decel Time: 169 msec    TR Vmax:         256.00 cm/s MV E velocity: 84.50 cm/s MV A velocity: 93.00 cm/s  SHUNTS MV E/A ratio:  0.91        Systemic VTI:  0.20 m                            Systemic Diam: 2.30 cm Morene Brownie Electronically signed by Morene Brownie Signature Date/Time: 01/28/2024/6:07:32 PM    Final         Scheduled Meds:  benztropine   0.5 mg Oral BID   cephALEXin   500 mg Oral QID   Chlorhexidine  Gluconate Cloth  6 each Topical Daily   enoxaparin  (LOVENOX ) injection  40 mg Subcutaneous Q24H   escitalopram   15 mg Oral Daily   furosemide   20 mg Oral Daily   hydrOXYzine   50 mg Oral BID   melatonin  5 mg Oral QHS   metoprolol  succinate  25 mg Oral Daily   mupirocin  ointment  1 Application Nasal BID   OLANZapine   5 mg Oral Daily   predniSONE   5 mg Oral Daily   sodium chloride  flush  3 mL Intravenous Q12H   Continuous Infusions:   LOS: 0 days    Time spent: 40 minutes    Toribio Hummer, MD [facility]   To contact the attending provider between 7A-7P or the covering provider during after hours 7P-7A, please log into the web site [email or web address] and access using universal [place] password for that web site. If you do not have the password, please call the hospital operator.  01/30/2024, 12:28 PM

## 2024-01-30 NOTE — Consult Note (Signed)
 WOC Nurse Consult Note: patient with history of venous insufficiency  Reason for Consult: BLE wounds  Wound type: full thickness covered in dry hyperkeratotic skin  Pressure Injury POA: NA not related to pressure  Measurement: widespread to B lower legs  Wound bed: dry hyperkeratotic skin as above  Drainage (amount, consistency, odor)  Periwound: edema, erythema, changes consistent with venous stasis  Dressing procedure/placement/frequency: Cleanse B lower legs (intact skin and ulcerations) with vashe wound cleanser Soila [phone]) do not rinse and allow to air dry. Apply Xeroform gauze ([place] [phone]) to patches of dry scaly hyperkeratotic skin as well any open wound beds daily.  Cover with ABD pad and secure with Kerlix roll gauze beginning right above toes to right above knees daily.  May apply Ace bandage wrapped in same fashion as Kerlix for light compression.    POC discussed with bedside nurse. WOC team will not follow. Re-consult if further needs arise.   Patient would benefit from referral to vascular or wound care center for ongoing management of venous insufficiency if not already followed as outpatient.    Thank you,    Estes Lehner MSN, RN-BC, CWON:

## 2024-01-30 NOTE — Progress Notes (Signed)
 Mobility Specialist - Progress Note   01/30/24 0833  Mobility  Activity Stood at bedside  Level of Assistance Minimal assist, patient does 75% or more  Assistive Device Front wheel walker  Range of Motion/Exercises Active Assistive  Activity Response Tolerated well  Mobility Referral Yes  Mobility visit 1 Mobility  Mobility Specialist Start Time (ACUTE ONLY) 0820  Mobility Specialist Stop Time (ACUTE ONLY) [id number]  Mobility Specialist Time Calculation (min) (ACUTE ONLY) 13 min   Pt was found sitting EOB and agreeable to mobilize. Pt did x2 STS and x2 STS with marching in place. Grew fatigued with session. At EOS returned to sit EOB with all needs met. Call bell in reach and RN notified.   Erminio Leos,  Mobility Specialist Can be reached via Secure Chat

## 2024-01-31 ENCOUNTER — Encounter (HOSPITAL_COMMUNITY): Payer: Self-pay | Admitting: Internal Medicine

## 2024-01-31 DIAGNOSIS — F419 Anxiety disorder, unspecified: Secondary | ICD-10-CM | POA: Diagnosis not present

## 2024-01-31 DIAGNOSIS — M069 Rheumatoid arthritis, unspecified: Secondary | ICD-10-CM | POA: Diagnosis not present

## 2024-01-31 DIAGNOSIS — R509 Fever, unspecified: Secondary | ICD-10-CM | POA: Diagnosis not present

## 2024-01-31 DIAGNOSIS — R0609 Other forms of dyspnea: Secondary | ICD-10-CM | POA: Diagnosis not present

## 2024-01-31 NOTE — Progress Notes (Signed)
 PROGRESS NOTE    [name]  FMW:[id number] DOB: [birth year] DOA: 01/27/2024 PCP: Loretha Richerd SAUNDERS, MD    No chief complaint on file.   Brief Narrative:  Patient is 78 year old female with history of hypertension, CKD stage IIIa, RA on chronic prednisone , chronic anemia, chronic bilateral lower extremity edema with venous stasis depression/anxiety/mild cognitive impairment who was admitted with fever, concern for ambulatory hypoxia.   Assessment & Plan:   Principal Problem:   Dyspnea Active Problems:   Rheumatoid arthritis (HCC)   Essential hypertension   Depression   Anxiety   Chronic kidney disease, stage 3a (HCC)   Fever   Venous stasis of both lower extremities  #1 reported ambulatory hypoxia/dyspnea/fever -Patient noted initially seen and discharged from the ED after reassuring workup and sent back to SNF presenting back with reported ongoing hypoxia with sats of 84% on room air with rest and 78% on room air with ambulation. - It is noted that in the ED patient sats dropped to 90% while at rest on room air and improved on 2 L O2. - Chest x-ray done with mild central vascular congestion but no overt pulmonary edema or effusion or consolidation. - Patient noted to have a temp of 101.4 on presentation back to the ED. - BNP minimally elevated at 122.7. - SARS coronavirus PCR negative, influenza A and B PCR negative, RSV PCR negative. - Patient noted with chronic lower extremity edema however did not appear volume overloaded. - Respiratory viral panel negative. - CT angiogram chest negative for PE or infiltrate. - 2D echo with a EF of 65 to 70%, and WMA, grade 1 DD.  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  Mild MVR.   -Urinalysis nitrite negative, leukocytes negative. - Fever curve trended down. -Patient afebrile since admission. - Repeat sats with sats of 95% on room air at rest, sats of 94% on room air while transferring. -Blood cultures ordered and pending with no growth to  date. - Follow.  2.  Chronic bilateral lower extremity edema/lower extremity rubor, skin changes, suggestive of venous stasis -Noted to be longstanding in nature. - Due to presentation with fevers with temp of 101.4 on admission patient placed on oral Keflex  which we will continue and treat for 5 to 7 days. - Continue Lasix  20 mg daily. - Follow.  3.  CKD stage IIIa -Stable.  4.  Hypertension - Toprol -XL.    5.  Chronic normocytic anemia -H&H stable.  6.  Rheumatoid arthritis - Continue current chronic prednisone  of 5 mg daily. - Continue to hold Arava  and Plaquenil  and likely resume on discharge.  7.  Depression/anxiety - Continue Cogentin , Lexapro , Zyprexa .   - Xanax  as needed.   8.  Mild cognitive impairment -Stable. - Outpatient follow-up.   DVT prophylaxis: Lovenox  Code Status: Full Family Communication: Updated patient.  No family at bedside. Disposition: Patient medically stable as of 01/30/2024.  Awaiting return back to SNF (per [facility] not accepting admissions over the weekend)    Status is: Observation The patient remains OBS appropriate and will d/c before 2 midnights.   Consultants:  None  Procedures:  CT angiogram chest 01/28/2024 CT head 01/27/2024 Chest x-ray 01/27/2024 2D echo 01/28/2024   Antimicrobials:  Anti-infectives (From admission, onward)    Start     Dose/Rate Route Frequency Ordered Stop   01/28/24 1000  cephALEXin  (KEFLEX ) capsule 500 mg        500 mg Oral 4 times daily 01/27/24 2314  Subjective: Patient sitting up at the side of the bed eating her lunch.  Denies any chest pain or shortness of breath.  Denies any abdominal pain.  Overall feels well.   Objective: Vitals:   01/30/24 2019 01/31/24 0500 01/31/24 0600 01/31/24 0951  BP: (!) 140/80  (!) 154/69 (!) 129/48  Pulse: 83  67 81  Resp: 16  16   Temp: 98.1 F (36.7 C)  98.2 F (36.8 C)   TempSrc: Oral  Oral   SpO2: 95%  96%   Weight:  77.3 kg    Height:         Intake/Output Summary (Last 24 hours) at 01/31/2024 1234 Last data filed at 01/31/2024 1226 Gross per 24 hour  Intake 960 ml  Output 3050 ml  Net -2090 ml   Filed Weights   01/29/24 0500 01/30/24 0500 01/31/24 0500  Weight: 77.5 kg 77.4 kg 77.3 kg    Examination:  General exam: NAD. Respiratory system: Lungs clear to auscultation bilaterally.  No wheezes, no crackles, no rhonchi.  Fair air movement.  Speaking in full sentences.  Cardiovascular system: Regular rate rhythm no murmurs rubs or gallops.  No JVD.  No lower extremity edema.   Gastrointestinal system: Abdomen is soft, nontender, nondistended, positive bowel sounds.  No rebound.  No guarding.  Central nervous system: Alert and oriented. No focal neurological deficits. Extremities: Symmetric 5 x 5 power. Skin: Chronic bilateral lower extremity with chronic venous stasis changes  L > R.  Erythematous changes. Psychiatry: Judgement and insight appear normal. Mood & affect appropriate.     Data Reviewed: I have personally reviewed following labs and imaging studies  CBC: Recent Labs  Lab 01/27/24 1248 01/28/24 0009 01/29/24 0412 01/30/24 0429  WBC 8.7 10.8* 12.3* 8.2  HGB 10.2* 9.3* 9.8* 9.6*  HCT 33.0* 29.9* 31.1* 31.7*  MCV 95.4 95.2 94.0 95.5  PLT 198 185 207 203    Basic Metabolic Panel: Recent Labs  Lab 01/27/24 1248 01/28/24 0009 01/29/24 0412 01/30/24 0429  NA 142 142 139 141  K 4.3 3.6 3.5 3.9  CL 107 105 103 106  CO2 24 25 23 23   GLUCOSE 101* 127* 128* 94  BUN 21 25* 29* 30*  CREATININE 1.34* 1.52* 1.50* 1.49*  CALCIUM  8.2* 7.8* 7.7* 7.6*    GFR: Estimated Creatinine Clearance: 31.3 mL/min (A) (by C-G formula based on SCr of 1.49 mg/dL (H)).  Liver Function Tests: Recent Labs  Lab 01/27/24 1248  AST 47*  ALT 39  ALKPHOS 113  BILITOT 0.8  PROT 6.9  ALBUMIN  3.4*    CBG: No results for input(s): GLUCAP in the last 168 hours.   Recent Results (from the past 240 hours)  Resp  panel by RT-PCR (RSV, Flu A&B, Covid) Anterior Nasal Swab     Status: None   Collection Time: 01/27/24 12:49 PM   Specimen: Anterior Nasal Swab  Result Value Ref Range Status   SARS Coronavirus 2 by RT PCR NEGATIVE NEGATIVE Final    Comment: (NOTE) SARS-CoV-2 target nucleic acids are NOT DETECTED.  The SARS-CoV-2 RNA is generally detectable in upper respiratory specimens during the acute phase of infection. The lowest concentration of SARS-CoV-2 viral copies this assay can detect is 138 copies/mL. A negative result does not preclude SARS-Cov-2 infection and should not be used as the sole basis for treatment or other patient management decisions. A negative result may occur with  improper specimen collection/handling, submission of specimen other than nasopharyngeal swab, presence of  viral mutation(s) within the areas targeted by this assay, and inadequate number of viral copies(<138 copies/mL). A negative result must be combined with clinical observations, patient history, and epidemiological information. The expected result is Negative.  Fact Sheet for Patients:  BloggerCourse.com  Fact Sheet for Healthcare Providers:  SeriousBroker.it  This test is no t yet approved or cleared by the [place]  FDA and  has been authorized for detection and/or diagnosis of SARS-CoV-2 by FDA under an Emergency Use Authorization (EUA). This EUA will remain  in effect (meaning this test can be used) for the duration of the COVID-19 declaration under Section 564(b)(1) of the Act, 21 U.S.C.section 360bbb-3(b)(1), unless the authorization is terminated  or revoked sooner.       Influenza A by PCR NEGATIVE NEGATIVE Final   Influenza B by PCR NEGATIVE NEGATIVE Final    Comment: (NOTE) The Xpert Xpress SARS-CoV-2/FLU/RSV plus assay is intended as an aid in the diagnosis of influenza from Nasopharyngeal swab specimens and should not be used as a  sole basis for treatment. Nasal washings and aspirates are unacceptable for Xpert Xpress SARS-CoV-2/FLU/RSV testing.  Fact Sheet for Patients: BloggerCourse.com  Fact Sheet for Healthcare Providers: SeriousBroker.it  This test is not yet approved or cleared by the [place]  FDA and has been authorized for detection and/or diagnosis of SARS-CoV-2 by FDA under an Emergency Use Authorization (EUA). This EUA will remain in effect (meaning this test can be used) for the duration of the COVID-19 declaration under Section 564(b)(1) of the Act, 21 U.S.C. section 360bbb-3(b)(1), unless the authorization is terminated or revoked.     Resp Syncytial Virus by PCR NEGATIVE NEGATIVE Final    Comment: (NOTE) Fact Sheet for Patients: BloggerCourse.com  Fact Sheet for Healthcare Providers: SeriousBroker.it  This test is not yet approved or cleared by the [place]  FDA and has been authorized for detection and/or diagnosis of SARS-CoV-2 by FDA under an Emergency Use Authorization (EUA). This EUA will remain in effect (meaning this test can be used) for the duration of the COVID-19 declaration under Section 564(b)(1) of the Act, 21 U.S.C. section 360bbb-3(b)(1), unless the authorization is terminated or revoked.  Performed at [facility], [address], [place], [place] [address]   Respiratory (~20 pathogens) panel by PCR     Status: None   Collection Time: 01/27/24 12:49 PM   Specimen: Nasopharyngeal Swab; Respiratory  Result Value Ref Range Status   Adenovirus NOT DETECTED NOT DETECTED Final   Coronavirus 229E NOT DETECTED NOT DETECTED Final    Comment: (NOTE) The Coronavirus on the Respiratory Panel, DOES NOT test for the novel  Coronavirus (2019 nCoV)    Coronavirus HKU1 NOT DETECTED NOT DETECTED Final   Coronavirus NL63 NOT DETECTED NOT DETECTED Final    Coronavirus OC43 NOT DETECTED NOT DETECTED Final   Metapneumovirus NOT DETECTED NOT DETECTED Final   Rhinovirus / Enterovirus NOT DETECTED NOT DETECTED Final   Influenza A NOT DETECTED NOT DETECTED Final   Influenza B NOT DETECTED NOT DETECTED Final   Parainfluenza Virus 1 NOT DETECTED NOT DETECTED Final   Parainfluenza Virus 2 NOT DETECTED NOT DETECTED Final   Parainfluenza Virus 3 NOT DETECTED NOT DETECTED Final   Parainfluenza Virus 4 NOT DETECTED NOT DETECTED Final   Respiratory Syncytial Virus NOT DETECTED NOT DETECTED Final   Bordetella pertussis NOT DETECTED NOT DETECTED Final   Bordetella Parapertussis NOT DETECTED NOT DETECTED Final   Chlamydophila pneumoniae NOT DETECTED NOT DETECTED Final   Mycoplasma  pneumoniae NOT DETECTED NOT DETECTED Final    Comment: Performed at [facility], [address] [place], [place] [address]  MRSA Next Gen by PCR, Nasal     Status: Abnormal   Collection Time: 01/28/24  2:37 AM   Specimen: Nasal Mucosa; Nasal Swab  Result Value Ref Range Status   MRSA by PCR Next Gen DETECTED (A) NOT DETECTED Final    Comment: (NOTE) The GeneXpert MRSA Assay (FDA approved for NASAL specimens only), is one component of a comprehensive MRSA colonization surveillance program. It is not intended to diagnose MRSA infection nor to guide or monitor treatment for MRSA infections. Test performance is not FDA approved in patients less than 70 years old. Performed at [facility], [address], [place], [place] [address]   Culture, blood (Routine X 2) w Reflex to ID Panel     Status: None (Preliminary result)   Collection Time: 01/28/24 10:41 AM   Specimen: BLOOD  Result Value Ref Range Status   Specimen Description   Final    BLOOD SITE NOT SPECIFIED Performed at [facility], [address], [place], [place] [address]    Special Requests   Final    BOTTLES DRAWN AEROBIC ONLY Blood Culture results may not be optimal due to an  inadequate volume of blood received in culture bottles Performed at [facility], [address] [place], [place] [address]    Culture   Final    NO GROWTH 3 DAYS Performed at [facility], [address] [place], [place] [address]    Report Status PENDING  Incomplete  Culture, blood (Routine X 2) w Reflex to ID Panel     Status: None (Preliminary result)   Collection Time: 01/28/24 10:46 AM   Specimen: BLOOD  Result Value Ref Range Status   Specimen Description   Final    BLOOD SITE NOT SPECIFIED Performed at [facility], [address] [place], [place] [address]    Special Requests   Final    BOTTLES DRAWN AEROBIC ONLY Blood Culture results may not be optimal due to an inadequate volume of blood received in culture bottles Performed at [facility], [address], [place], [place] [address]    Culture   Final    NO GROWTH 3 DAYS Performed at [facility], [address], [place], [place] [address]    Report Status PENDING  Incomplete         Radiology Studies: No results found.       Scheduled Meds:  benztropine   0.5 mg Oral BID   cephALEXin   500 mg Oral QID   Chlorhexidine  Gluconate Cloth  6 each Topical Daily   enoxaparin  (LOVENOX ) injection  40 mg Subcutaneous Q24H   escitalopram   15 mg Oral Daily   furosemide   20 mg Oral Daily   hydrOXYzine   50 mg Oral BID   melatonin  5 mg Oral QHS   metoprolol  succinate  25 mg Oral Daily   mupirocin  ointment  1 Application Nasal BID   OLANZapine   5 mg Oral Daily   predniSONE   5 mg Oral Daily   sodium chloride  flush  3 mL Intravenous Q12H   Continuous Infusions:   LOS: 0 days    Time spent: 35 minutes    Toribio Hummer, MD [facility]   To contact the attending provider between 7A-7P or the covering provider during after hours 7P-7A,  please log into the web site [email or web address] and access using universal [place] password for that web site.  If you do not have the password, please call the hospital operator.  01/31/2024, 12:34 PM

## 2024-01-31 NOTE — Progress Notes (Signed)
 Mobility Specialist - Progress Note   01/31/24 0926  Mobility  Activity Stood at bedside  Level of Assistance Minimal assist, patient does 75% or more  Assistive Device Front wheel walker  Range of Motion/Exercises Active  Activity Response Tolerated well  Mobility Referral Yes  Mobility visit 1 Mobility  Mobility Specialist Start Time (ACUTE ONLY) [id number]  Mobility Specialist Stop Time (ACUTE ONLY) [id number]  Mobility Specialist Time Calculation (min) (ACUTE ONLY) 10 min   Pt was found sitting EOB and agreeable to mobilize. Pt abke to do x3STS with marching. Grew fatigued and at EOS returned to sit EOB. All needs met. Call bell in reach and brother in room.  [name],  Mobility Specialist Can be reached via Secure Chat

## 2024-01-31 NOTE — Progress Notes (Signed)
 Mobility Specialist - Progress Note   01/31/24 1557  Mobility  Activity Transferred to/from [facility];Transferred from bed to chair  Level of Assistance Moderate assist, patient does 50-74%  Assistive Device Front wheel walker  Range of Motion/Exercises Active  Activity Response Tolerated well  Mobility Referral Yes  Mobility visit 1 Mobility  Mobility Specialist Start Time (ACUTE ONLY) 1545  Mobility Specialist Stop Time (ACUTE ONLY) 1557  Mobility Specialist Time Calculation (min) (ACUTE ONLY) 12 min   Pt was found in bed and agreeable to transfer to recliner chair. Was left with all needs met. Call bell in reach and chair alarm on. RN notified.  Erminio Leos,  Mobility Specialist Can be reached via Secure Chat

## 2024-02-01 DIAGNOSIS — N1831 Chronic kidney disease, stage 3a: Secondary | ICD-10-CM | POA: Diagnosis not present

## 2024-02-01 DIAGNOSIS — I878 Other specified disorders of veins: Secondary | ICD-10-CM | POA: Diagnosis not present

## 2024-02-01 DIAGNOSIS — I10 Essential (primary) hypertension: Secondary | ICD-10-CM | POA: Diagnosis not present

## 2024-02-01 DIAGNOSIS — F419 Anxiety disorder, unspecified: Secondary | ICD-10-CM | POA: Diagnosis not present

## 2024-02-01 DIAGNOSIS — R0609 Other forms of dyspnea: Secondary | ICD-10-CM | POA: Diagnosis not present

## 2024-02-01 DIAGNOSIS — Z7401 Bed confinement status: Secondary | ICD-10-CM | POA: Diagnosis not present

## 2024-02-01 DIAGNOSIS — Z743 Need for continuous supervision: Secondary | ICD-10-CM | POA: Diagnosis not present

## 2024-02-01 DIAGNOSIS — R509 Fever, unspecified: Secondary | ICD-10-CM | POA: Diagnosis not present

## 2024-02-01 DIAGNOSIS — R531 Weakness: Secondary | ICD-10-CM | POA: Diagnosis not present

## 2024-02-01 MED ORDER — CEPHALEXIN 500 MG PO CAPS
500.0000 mg | ORAL_CAPSULE | Freq: Four times a day (QID) | ORAL | Status: AC
Start: 2024-02-01 — End: 2024-02-04

## 2024-02-01 MED ORDER — ALPRAZOLAM 0.25 MG PO TABS: 0.2500 mg | ORAL_TABLET | Freq: Two times a day (BID) | ORAL | 0 refills | Status: DC | PRN

## 2024-02-01 MED ORDER — LEFLUNOMIDE 20 MG PO TABS: 20.0000 mg | ORAL_TABLET | Freq: Every day | ORAL | Status: AC

## 2024-02-01 MED ORDER — HYDROXYCHLOROQUINE SULFATE 200 MG PO TABS: 600.0000 mg | ORAL_TABLET | Freq: Every day | ORAL | Status: AC

## 2024-02-01 NOTE — Plan of Care (Signed)

## 2024-02-01 NOTE — [place] FL2 (Signed)
 [place]  MEDICAID FL2 LEVEL OF CARE FORM     IDENTIFICATION  Patient Name: [name] Birthdate: Apr 29, 1946 Sex: female Admission Date (Current Location): 01/27/2024  [facility] and [place] Number:  Producer, television/film/video and Address:  [facility],  [address] [place], [place] [address]      Provider Number: [id number]  Attending Physician Name and Address:  Sebastian Toribio GAILS, MD  Relative Name and Phone Number:  Nena Ellen (sister) Ph: [phone]    Current Level of Care: Hospital Recommended Level of Care: Assisted Living Facility Prior Approval Number:    Date Approved/Denied:   PASRR Number:    Discharge Plan: Other (Comment) ([facility])    Current Diagnoses: Patient Active Problem List   Diagnosis Date Noted   Fever 01/28/2024   Venous stasis of both lower extremities 01/28/2024   Dyspnea 01/27/2024   Abdominal pain 10/30/2022   Abdominal fluid collection 10/30/2022   H/O transurethral resection of bladder tumor (TURBT) 10/30/2022   Bladder injury 10/30/2022   Acute renal failure superimposed on stage 3a chronic kidney disease (HCC) 10/30/2022   Cervical myelopathy (HCC) 06/14/2021   Anxiety    Chronic kidney disease, stage 3a (HCC)    Cervical stenosis of spinal canal    Hyperlipidemia associated with type 2 diabetes mellitus (HCC) 03/14/2021   Acute respiratory failure with hypoxia (HCC) 01/01/2021   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   Cognitive impairment 04/17/2019   Degeneration of lumbar intervertebral disc 10/15/2018   MDD (major depressive disorder), recurrent episode, severe (HCC) 08/10/2018   MDD (major depressive disorder), recurrent, severe, with psychosis (HCC)    Altered mental status 05/24/2018   Ketonuria 05/24/2018   Pain in right hand 09/11/2017   Essential hypertension 03/07/2014   Hyperlipidemia 03/07/2014   Depression 03/07/2014   Spinal stenosis of lumbar region with neurogenic claudication 02/24/2014   Type 2  diabetes mellitus without complication (HCC) 02/24/2014   Rheumatoid arthritis (HCC) 02/24/2014   Scoliosis of lumbar spine 02/13/2014    Orientation RESPIRATION BLADDER Height & Weight     Self, Time, Situation, Place  Normal Continent Weight: 157 lb 3 oz (71.3 kg) Height:  5' 4 (162.6 cm)  BEHAVIORAL SYMPTOMS/MOOD NEUROLOGICAL BOWEL NUTRITION STATUS      Continent Diet (Heart healthy diet)  AMBULATORY STATUS COMMUNICATION OF NEEDS Skin   Independent Verbally Other (Comment) (non pressure wound left and right leg.)                       Personal Care Assistance Level of Assistance  Bathing, Feeding, Dressing Bathing Assistance: Limited assistance Feeding assistance: Independent Dressing Assistance: Limited assistance     Functional Limitations Info  Sight, Hearing, Speech Sight Info: Adequate Hearing Info: Adequate Speech Info: Adequate    SPECIAL CARE FACTORS FREQUENCY                       Contractures Contractures Info: Not present    Additional Factors Info  Code Status, Allergies Code Status Info: Full Allergies Info: Iodinated Contrast Media, Abilify (Aripiprazole), Ultram (Tramadol Hcl), Zithromax (Azithromycin)           Current Medications (02/01/2024):  This is the current hospital active medication list Current Facility-Administered Medications  Medication Dose Route Frequency Provider Last Rate Last Admin   acetaminophen  (TYLENOL ) tablet 650 mg  650 mg Oral Q6H PRN Patel, Vishal R, MD   650 mg at 01/30/24 1051   Or   acetaminophen  (  TYLENOL ) suppository 650 mg  650 mg Rectal Q6H PRN Patel, Vishal R, MD       ALPRAZolam  (XANAX ) tablet 0.25 mg  0.25 mg Oral BID PRN Patel, Vishal R, MD   0.25 mg at 01/30/24 1310   benztropine  (COGENTIN ) tablet 0.5 mg  0.5 mg Oral BID Patel, Vishal R, MD   0.5 mg at 02/01/24 0915   cephALEXin  (KEFLEX ) capsule 500 mg  500 mg Oral QID Patel, Vishal R, MD   500 mg at 02/01/24 0915   enoxaparin  (LOVENOX ) injection  40 mg  40 mg Subcutaneous Q24H Patel, Vishal R, MD   40 mg at 02/01/24 0915   escitalopram  (LEXAPRO ) tablet 15 mg  15 mg Oral Daily Patel, Vishal R, MD   15 mg at 02/01/24 0915   furosemide  (LASIX ) tablet 20 mg  20 mg Oral Daily Patel, Vishal R, MD   20 mg at 02/01/24 0915   hydrOXYzine  (ATARAX ) tablet 50 mg  50 mg Oral BID Patel, Vishal R, MD   50 mg at 02/01/24 0915   melatonin tablet 5 mg  5 mg Oral QHS Patel, Vishal R, MD   5 mg at 01/31/24 2252   metoprolol  succinate (TOPROL -XL) 24 hr tablet 25 mg  25 mg Oral Daily Patel, Vishal R, MD   25 mg at 02/01/24 0915   mupirocin  ointment (BACTROBAN ) 2 % 1 Application  1 Application Nasal BID Sebastian Toribio GAILS, MD   1 Application at 02/01/24 0915   OLANZapine  (ZYPREXA ) tablet 5 mg  5 mg Oral Daily Patel, Vishal R, MD   5 mg at 02/01/24 9173   ondansetron  (ZOFRAN ) tablet 4 mg  4 mg Oral Q6H PRN Patel, Vishal R, MD       Or   ondansetron  (ZOFRAN ) injection 4 mg  4 mg Intravenous Q6H PRN Patel, Vishal R, MD       predniSONE  (DELTASONE ) tablet 5 mg  5 mg Oral Daily Patel, Vishal R, MD   5 mg at 02/01/24 0915   senna-docusate (Senokot-S) tablet 1 tablet  1 tablet Oral QHS PRN Patel, Vishal R, MD       sodium chloride  flush (NS) 0.9 % injection 3 mL  3 mL Intravenous Q12H Patel, Vishal R, MD   3 mL at 02/01/24 9083     Discharge Medications: Please see discharge summary for a list of discharge medications.  Relevant Imaging Results:  Relevant Lab Results:   Additional Information SSN: [id number]  Tawni HERO Lazarius Rivkin, LCSW

## 2024-02-01 NOTE — Progress Notes (Signed)
 Report called and given to [facility].

## 2024-02-01 NOTE — Discharge Summary (Signed)
 Physician Discharge Summary  [name] FMW:[id number] DOB: Nov 25, 1945 DOA: 01/27/2024  PCP: Loretha Richerd SAUNDERS, MD  Admit date: 01/27/2024 Discharge date: 02/01/2024  Time spent: 60 minutes  Recommendations for Outpatient Follow-up:  Follow-up with Rankins, Richerd SAUNDERS, MD in 2 weeks.  On follow-up patient need a basic metabolic profile done to follow-up on electrolytes and renal function. Follow-up with MD at skilled nursing facility.   Discharge Diagnoses:  Principal Problem:   Dyspnea Active Problems:   Rheumatoid arthritis (HCC)   Essential hypertension   Depression   Anxiety   Chronic kidney disease, stage 3a (HCC)   Fever   Venous stasis of both lower extremities   Discharge Condition: Stable and improved.  Diet recommendation: Heart healthy  Filed Weights   01/30/24 0500 01/31/24 0500 02/01/24 0500  Weight: 77.4 kg 77.3 kg 71.3 kg    History of present illness:  HPI per Dr. Tobie Montie JAYSON Cough is a 78 y.o. female with medical history significant for HTN, CKD stage IIIa, RA on chronic prednisone , chronic anemia, chronic bilateral lower extremity edema with venous stasis, depression/anxiety, mild cognitive impairment who presented to the ED from [facility] for evaluation of hypoxia.   Patient seen in the ED earlier today for evaluation of dyspnea and hypoxia as documented below.  She was discharged back to SNF.  Per report she was again noted to be hypoxic at her SNF with SpO2 84% on room air while at rest and 78% on room air with ambulation.  She was sent back to the ED.   Patient herself states that she has not really had much shortness of breath.  She reports chronic intermittent cough without sputum production.  She reports that her lower extremity changes are not really different than her baseline.   She is awake and alert but oriented to self and year only.  She denies nausea, vomiting, abdominal pain, dysuria, chest pain.   ED Course   Labs/Imaging on admission: I have personally reviewed following labs and imaging studies.   Patient initially presented to ED around noon earlier today.  Initial vitals showed BP 125/68, pulse 86, RR not recorded, temp 97.8 F, SpO2 95% on 2 L O2 via [place].   Labs show WBC 8.7, hemoglobin 10.2, platelets 198, sodium 142, potassium 4.3, bicarb 24, BUN 21, creatinine 1.34, serum glucose 1 1, troponin 13, BNP 122.7.   SARS-CoV-2, influenza, RV PCR negative.  UA negative for UTI.   Formal chest x-ray showed low lung volumes with central pulmonary vascular congestion and bibasilar scarring.  No pleural fluid.   CT head without contrast was negative for acute intracranial pathology.  Admit small vessel white matter disease was seen.   Patient was given IV Lasix  20 mg and IV ceftriaxone  2 g.  The case was discussed with the hospitalist service who consulted on patient.  Patient's lower extremity edema and skin changes were felt to be longstanding in nature.  Patient did not have any documented hypoxia and on ambulation SPO2 remained 93% or higher.  She was mentating appropriately.  She is felt stable to return back to SNF.  Patient was given a prescription for Keflex .   On return to SNF patient reportedly was satting in the low 80s at the facility.  EMS were called and per EDP SPO2 was as low as 78% with ambulation.  She was placed on 2 L O2 via [place] and brought back to the ER.     On return to  ED she had new fever 101.4 F.  Per EDP SPO2 dropped to 90% while on room air and she was placed back on 2 L O2 [place].  The hospitalist service was consulted for admission.   Hospital Course:  #1 reported ambulatory hypoxia/dyspnea/fever -Patient noted initially seen and discharged from the ED after reassuring workup and sent back to SNF presenting back with reported ongoing hypoxia with sats of 84% on room air with rest and 78% on room air with ambulation. - It is noted that in the ED patient sats dropped to 90% while  at rest on room air and improved on 2 L O2. - Chest x-ray done with mild central vascular congestion but no overt pulmonary edema or effusion or consolidation. - Patient noted to have a temp of 101.4 on presentation back to the ED. - BNP minimally elevated at 122.7. - SARS coronavirus PCR negative, influenza A and B PCR negative, RSV PCR negative. - Patient noted with chronic lower extremity edema however did not appear volume overloaded. - Respiratory viral panel negative. - CT angiogram chest negative for PE or infiltrate. - 2D echo with a EF of 65 to 70%, and WMA, grade 1 DD.  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  Mild MVR.   -Urinalysis nitrite negative, leukocytes negative. - Fever curve trended down and patient remained afebrile for the rest of the hospitalization. - Repeat sats with sats of 95% on room air at rest, sats of 94% on room air while transferring. -Blood cultures ordered with no growth to date.   - Patient be discharged in stable and improved condition with outpatient follow-up with PCP.    2.  Chronic bilateral lower extremity edema/lower extremity rubor, skin changes, suggestive of venous stasis -Noted to be longstanding in nature. - Due to presentation with fevers with temp of 101.4 on admission patient placed on oral Keflex  which was continued during the hospitalization and patient was discharged on 3 more days of Keflex  to complete a 7-day course of antibiotic treatment.   - Patient also maintained on home regimen Lasix .   - Patient remained stable, remained afebrile throughout the hospitalization and will be discharged in stable and improved condition.    3.  CKD stage IIIa -Stable.   4.  Hypertension - Patient maintained on home regimen Toprol -XL.     5.  Chronic normocytic anemia -H&H stable.   6.  Rheumatoid arthritis - Patient maintained on home regimen chronic prednisone  5 mg daily. - Patient is on Arava  and Plaquenil  were held as patient on antibiotics with concerns for  an infection and will be resumed after completion of antibiotics in 3 to 4 days.   - Outpatient follow-up.    7.  Depression/anxiety - Patient was maintained on home regimen Cogentin , Lexapro , Zyprexa .   - Xanax  as needed.    8.  Mild cognitive impairment -Stable. - Outpatient follow-up.  Procedures: CT angiogram chest 01/28/2024 CT head 01/27/2024 Chest x-ray 01/27/2024 2D echo 01/28/2024   Consultations: None  Discharge Exam: Vitals:   02/01/24 0546 02/01/24 0915  BP: (!) 169/70 (!) 125/57  Pulse: 72 88  Resp: 18   Temp: 97.9 F (36.6 C)   SpO2: 94%     General: NAD Cardiovascular: RRR no murmurs rubs or gallops.  No JVD.  No lower extremity edema. Respiratory: Clear to auscultation bilaterally.  No wheezes, no crackles, no rhonchi.  Fair air movement.  Speaking in full sentences.  Discharge Instructions   Discharge Instructions  Diet - low sodium heart healthy   Complete by: As directed    Discharge wound care:   Complete by: As directed    Wound care  Daily      Comments: Cleanse B lower legs (intact skin and ulcerations) with vashe wound cleanser Soila [phone]) do not rinse and allow to air dry. Apply Xeroform gauze (Lawson [phone]) to patches of dry scaly hyperkeratotic skin as well any open wound beds daily.  Cover with ABD pad and secure with Kerlix roll gauze beginning right above toes to right above knees daily.  May apply Ace bandage wrapped in same fashion as Kerlix for light compression.   Increase activity slowly   Complete by: As directed       Allergies as of 02/01/2024       Reactions   Iodinated Contrast Media Anaphylaxis, Hives, Other (See Comments)   Only the retina dye- Allergic, per [facility]   Abilify [aripiprazole] Other (See Comments)   Allergic, per [facility]   Ultram [tramadol Hcl] Other (See Comments)   Allergic, per [facility]   Zithromax [azithromycin] Anxiety, Other (See Comments)   Heightens anxiety and Allergic, per [facility]         Medication List     TAKE these medications    acetaminophen  500 MG tablet Commonly known as: TYLENOL  Take 1,000 mg by mouth every 6 (six) hours as needed for fever (or pain).   ALPRAZolam  0.25 MG tablet Commonly known as: XANAX  Take 1 tablet (0.25 mg total) by mouth 2 (two) times daily as needed for anxiety.   ascorbic acid 500 MG tablet Commonly known as: VITAMIN C Take 500 mg by mouth 3 (three) times a week.   aspirin  81 MG chewable tablet Chew 81 mg by mouth daily.   benztropine  0.5 MG tablet Commonly known as: COGENTIN  Take 0.5 mg by mouth in the morning and at bedtime.   Calmoseptine 0.44-20.6 % Oint Generic drug: Menthol -Zinc Oxide Place 1 Application into the perineum 3 (three) times daily as needed (reason not listed).   CENTRUM SILVER PO Take 1 tablet by mouth daily.   cephALEXin  500 MG capsule Commonly known as: KEFLEX  Take 1 capsule (500 mg total) by mouth 4 (four) times daily for 3 days.   Dextromethorphan-guaiFENesin 5-100 MG/5ML Liqd Take 20 mLs by mouth every 4 (four) hours as needed (cough/congestion).   escitalopram  10 MG tablet Commonly known as: LEXAPRO  Take 15 mg by mouth daily.   ferrous sulfate  325 (65 FE) MG EC tablet Take 325 mg by mouth 3 (three) times a week.   hydroxychloroquine  200 MG tablet Commonly known as: PLAQUENIL  Take 3 tablets (600 mg total) by mouth daily. Start taking on: February 05, 2024 What changed: These instructions start on February 05, 2024. If you are unsure what to do until then, ask your doctor or other care provider.   hydrOXYzine  50 MG capsule Commonly known as: VISTARIL  Take 50 mg by mouth in the morning and at bedtime.   ipratropium 0.03 % nasal spray Commonly known as: ATROVENT Place 2 sprays into both nostrils 2 (two) times daily as needed for rhinitis.   Lasix  20 MG tablet Generic drug: furosemide  Take 20 mg by mouth daily.   leflunomide  20 MG tablet Commonly known as: ARAVA  Take 1 tablet (20 mg total)  by mouth daily. Start taking on: February 05, 2024 What changed: These instructions start on February 05, 2024. If you are unsure what to do until then, ask your doctor or other  care provider.   loperamide 2 MG tablet Commonly known as: IMODIUM A-D Take 2 mg by mouth as needed for diarrhea or loose stools (after each loose stool- MAX OF 3 TABLETS/24 HOURS).   loratadine  10 MG tablet Commonly known as: CLARITIN  Take 1 tablet (10 mg total) by mouth daily.   melatonin 5 MG Tabs Take 5 mg by mouth at bedtime.   metoprolol  succinate 25 MG 24 hr tablet Commonly known as: TOPROL -XL Take 25 mg by mouth daily.   NATURAL BALANCE TEARS OP Place 1 drop into the right eye as needed (irritation/itching).   NON FORMULARY Place 1 application  into the right eye See admin instructions. Cool compress- 1 application to right eye as needed (reason not listed)   nystatin  powder Apply 1 Application topically daily as needed (after bathing for brief change with excessive soiling).   nystatin  cream Commonly known as: MYCOSTATIN  Apply 1 Application topically 2 (two) times daily as needed (for redness- under abdominal folds and/or under the breasts).   OLANZapine  5 MG tablet Commonly known as: ZYPREXA  Take 5 mg by mouth daily.   polyethylene glycol 17 g packet Commonly known as: MIRALAX  / GLYCOLAX  Take 17 g by mouth 2 (two) times daily. What changed: when to take this   predniSONE  5 MG tablet Commonly known as: DELTASONE  Take 5 mg by mouth daily.   REGULOID PO Take 0.4 g by mouth at bedtime.   Sore Throat 15-3.6 MG Lozg Generic drug: Benzocaine-Menthol  Use as directed 1 lozenge in the mouth or throat every 2 (two) hours as needed (sore throat).   Urea  40 % Lotn Apply 1 Application topically 2 (two) times daily as needed (dryness/itching).   Vitamin D  50 MCG (2000 UT) Caps Take 2,000 Units by mouth daily.               Discharge Care Instructions  (From admission, onward)            Start     Ordered   02/01/24 0000  Discharge wound care:       Comments: Wound care  Daily      Comments: Cleanse B lower legs (intact skin and ulcerations) with vashe wound cleanser Soila [phone]) do not rinse and allow to air dry. Apply Xeroform gauze ([place] [phone]) to patches of dry scaly hyperkeratotic skin as well any open wound beds daily.  Cover with ABD pad and secure with Kerlix roll gauze beginning right above toes to right above knees daily.  May apply Ace bandage wrapped in same fashion as Kerlix for light compression.   02/01/24 1158           Allergies  Allergen Reactions   Iodinated Contrast Media Anaphylaxis, Hives and Other (See Comments)    Only the retina dye- Allergic, per [facility]   Abilify [Aripiprazole] Other (See Comments)    Allergic, per [facility]   Ultram [Tramadol Hcl] Other (See Comments)    Allergic, per [facility]   Zithromax [Azithromycin] Anxiety and Other (See Comments)    Heightens anxiety and Allergic, per [facility]    Follow-up Information     Rankins, Richerd SAUNDERS, MD. Schedule an appointment as soon as possible for a visit in 2 week(s).   Specialty: Family Medicine Contact information: [address] [place] [address] [phone]         MD at SNF Follow up.                   The  results of significant diagnostics from this hospitalization (including imaging, microbiology, ancillary and laboratory) are listed below for reference.    Significant Diagnostic Studies: ECHOCARDIOGRAM COMPLETE Result Date: 01/28/2024    ECHOCARDIOGRAM REPORT   Patient Name:   LAYANI FORONDA [facility] Date of Exam: 01/28/2024 Medical Rec #:  [id number]          Height:       64.0 in Accession #:    [id number]         Weight:       169.3 lb Date of Birth:  [birth year]          BSA:          1.823 m Patient Age:    78 years           BP:           127/60 mmHg Patient Gender: F                  HR:           86 bpm. Exam Location:  Inpatient Procedure: 2D Echo,  Cardiac Doppler and Color Doppler (Both Spectral and Color            Flow Doppler were utilized during procedure). Indications:    R06.02 SOB  History:        Patient has prior history of Echocardiogram examinations, most                 recent 12/28/2020. Signs/Symptoms:Altered Mental Status; Risk                 Factors:Hypertension, Diabetes and Dyslipidemia.  Sonographer:    Ellouise Mose RDCS Referring Phys: [id number] VISHAL R PATEL  Sonographer Comments: Patient with restrictive clothing IMPRESSIONS  1. Left ventricular ejection fraction, by estimation, is 65 to 70%. The left ventricle has normal function. The left ventricle has no regional wall motion abnormalities. Left ventricular diastolic parameters are consistent with Grade I diastolic dysfunction (impaired relaxation).  2. Right ventricular systolic function is normal. The right ventricular size is normal. There is mildly elevated pulmonary artery systolic pressure. The estimated right ventricular systolic pressure is 41.2 mmHg.  3. The mitral valve is normal in structure. Mild mitral valve regurgitation. No evidence of mitral stenosis.  4. The aortic valve is tricuspid. Aortic valve regurgitation is mild to moderate. No aortic stenosis is present.  5. The inferior vena cava is dilated in size with <50% respiratory variability, suggesting right atrial pressure of 15 mmHg. FINDINGS  Left Ventricle: Left ventricular ejection fraction, by estimation, is 65 to 70%. The left ventricle has normal function. The left ventricle has no regional wall motion abnormalities. The left ventricular internal cavity size was normal in size. There is  no left ventricular hypertrophy. Left ventricular diastolic parameters are consistent with Grade I diastolic dysfunction (impaired relaxation). Right Ventricle: The right ventricular size is normal. No increase in right ventricular wall thickness. Right ventricular systolic function is normal. There is mildly elevated pulmonary  artery systolic pressure. The tricuspid regurgitant velocity is 2.56  m/s, and with an assumed right atrial pressure of 15 mmHg, the estimated right ventricular systolic pressure is 41.2 mmHg. Left Atrium: Left atrial size was normal in size. Right Atrium: Right atrial size was normal in size. Pericardium: There is no evidence of pericardial effusion. Mitral Valve: The mitral valve is normal in structure. Mild mitral valve regurgitation. No evidence of mitral valve stenosis. Tricuspid Valve: The tricuspid valve is normal  in structure. Tricuspid valve regurgitation is trivial. No evidence of tricuspid stenosis. Aortic Valve: The aortic valve is tricuspid. Aortic valve regurgitation is mild to moderate. Aortic regurgitation PHT measures 315 msec. No aortic stenosis is present. Pulmonic Valve: The pulmonic valve was normal in structure. Pulmonic valve regurgitation is trivial. No evidence of pulmonic stenosis. Aorta: The aortic root and ascending aorta are structurally normal, with no evidence of dilitation. Venous: The inferior vena cava is dilated in size with less than 50% respiratory variability, suggesting right atrial pressure of 15 mmHg. IAS/Shunts: No atrial level shunt detected by color flow Doppler.  LEFT VENTRICLE PLAX 2D LVIDd:         3.80 cm     Diastology LVIDs:         2.20 cm     LV e' medial:    6.74 cm/s LV PW:         1.10 cm     LV E/e' medial:  12.5 LV IVS:        1.00 cm     LV e' lateral:   10.10 cm/s LVOT diam:     2.30 cm     LV E/e' lateral: 8.4 LV SV:         83 LV SV Index:   45 LVOT Area:     4.15 cm  LV Volumes (MOD) LV vol d, MOD A2C: 77.5 ml LV vol d, MOD A4C: 72.8 ml LV vol s, MOD A2C: 22.9 ml LV vol s, MOD A4C: 22.5 ml LV SV MOD A2C:     54.6 ml LV SV MOD A4C:     72.8 ml LV SV MOD BP:      55.4 ml RIGHT VENTRICLE             IVC RV S prime:     13.60 cm/s  IVC diam: 2.40 cm TAPSE (M-mode): 1.9 cm LEFT ATRIUM             Index        RIGHT ATRIUM           Index LA diam:        4.00  cm 2.19 cm/m   RA Area:     11.90 cm LA Vol (A2C):   29.2 ml 16.02 ml/m  RA Volume:   25.80 ml  14.16 ml/m LA Vol (A4C):   23.1 ml 12.67 ml/m LA Biplane Vol: 25.8 ml 14.16 ml/m  AORTIC VALVE LVOT Vmax:   116.00 cm/s LVOT Vmean:  75.800 cm/s LVOT VTI:    0.199 m AI PHT:      315 msec  AORTA Ao Root diam: 2.70 cm Ao Asc diam:  3.10 cm MITRAL VALVE               TRICUSPID VALVE MV Area (PHT): 4.49 cm    TR Peak grad:   26.2 mmHg MV Decel Time: 169 msec    TR Vmax:        256.00 cm/s MV E velocity: 84.50 cm/s MV A velocity: 93.00 cm/s  SHUNTS MV E/A ratio:  0.91        Systemic VTI:  0.20 m                            Systemic Diam: 2.30 cm Morene Brownie Electronically signed by Morene Brownie Signature Date/Time: 01/28/2024/6:07:32 PM    Final    CT Angio Chest Pulmonary Embolism (PE)  W or WO Contrast Result Date: 01/28/2024 CLINICAL DATA:  PE suspected, dyspnea and hypoxia * Tracking Code: BO * EXAM: CT ANGIOGRAPHY CHEST WITH CONTRAST TECHNIQUE: Multidetector CT imaging of the chest was performed using the standard protocol during bolus administration of intravenous contrast. Multiplanar CT image reconstructions and MIPs were obtained to evaluate the vascular anatomy. RADIATION DOSE REDUCTION: This exam was performed according to the departmental dose-optimization program which includes automated exposure control, adjustment of the mA and/or kV according to patient size and/or use of iterative reconstruction technique. CONTRAST:  60mL OMNIPAQUE  IOHEXOL  350 MG/ML SOLN COMPARISON:  11/10/2022 FINDINGS: Cardiovascular: Satisfactory opacification of the pulmonary arteries to the segmental level. No evidence of pulmonary embolism. Normal heart size. No pericardial effusion. Aortic atherosclerosis. Mediastinum/Nodes: No enlarged mediastinal, hilar, or axillary lymph nodes. Large, heterogeneous right lobe thyroid  goiter (series 4, image 9). Status post left thyroidectomy. Trachea, and esophagus demonstrate no  significant findings. Lungs/Pleura: Dependent bibasilar scarring or atelectasis, unchanged compared to prior examination. No pleural effusion or pneumothorax. Upper Abdomen: No acute abnormality. Musculoskeletal: No chest wall abnormality. No acute osseous findings. Review of the MIP images confirms the above findings. IMPRESSION: 1. Negative examination for pulmonary embolism. 2. Dependent bibasilar scarring or atelectasis, unchanged compared to prior examination. No new airspace opacity. 3. 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 Presumably there has been previous ultrasound evaluation. Aortic Atherosclerosis (ICD10-I70.0). Electronically Signed   By: Marolyn JONETTA Jaksch M.D.   On: 01/28/2024 07:10   CT Head Wo Contrast Result Date: 01/27/2024 CLINICAL DATA:  Altered mental status EXAM: CT HEAD WITHOUT CONTRAST TECHNIQUE: Contiguous axial images were obtained from the base of the skull through the vertex without intravenous contrast. RADIATION DOSE REDUCTION: This exam was performed according to the departmental dose-optimization program which includes automated exposure control, adjustment of the mA and/or kV according to patient size and/or use of iterative reconstruction technique. COMPARISON:  05/19/2023 FINDINGS: Brain: No evidence of acute infarction, hemorrhage, hydrocephalus, extra-axial collection or mass lesion/mass effect. Extensive periventricular and deep white matter hypodensity. Vascular: No hyperdense vessel or unexpected calcification. Skull: Normal. Negative for fracture or focal lesion. Sinuses/Orbits: No acute finding. Other: None. IMPRESSION: No acute intracranial pathology. Advanced small-vessel white matter disease. Electronically Signed   By: Marolyn JONETTA Jaksch M.D.   On: 01/27/2024 15:51   DG Chest Port 1 View Result Date: 01/27/2024 CLINICAL DATA:  Shortness of breath.  Pitting edema in both legs. EXAM: PORTABLE CHEST 1 VIEW COMPARISON:  01/06/2023 and  CT chest 11/10/2022. FINDINGS: Patient is rotated. Heart size stable. Lungs are low in volume with central pulmonary vascular congestion and bibasilar scarring. No pleural fluid. IMPRESSION: Low lung volumes with central pulmonary vascular congestion and bibasilar scarring. Electronically Signed   By: Newell Eke M.D.   On: 01/27/2024 13:31    Microbiology: Recent Results (from the past 240 hours)  Resp panel by RT-PCR (RSV, Flu A&B, Covid) Anterior Nasal Swab     Status: None   Collection Time: 01/27/24 12:49 PM   Specimen: Anterior Nasal Swab  Result Value Ref Range Status   SARS Coronavirus 2 by RT PCR NEGATIVE NEGATIVE Final    Comment: (NOTE) SARS-CoV-2 target nucleic acids are NOT DETECTED.  The SARS-CoV-2 RNA is generally detectable in upper respiratory specimens during the acute phase of infection. The lowest concentration of SARS-CoV-2 viral copies this assay can detect is 138 copies/mL. A negative result does not preclude SARS-Cov-2 infection and should not be used as the sole basis  for treatment or other patient management decisions. A negative result may occur with  improper specimen collection/handling, submission of specimen other than nasopharyngeal swab, presence of viral mutation(s) within the areas targeted by this assay, and inadequate number of viral copies(<138 copies/mL). A negative result must be combined with clinical observations, patient history, and epidemiological information. The expected result is Negative.  Fact Sheet for Patients:  BloggerCourse.com  Fact Sheet for Healthcare Providers:  SeriousBroker.it  This test is no t yet approved or cleared by the [place]  FDA and  has been authorized for detection and/or diagnosis of SARS-CoV-2 by FDA under an Emergency Use Authorization (EUA). This EUA will remain  in effect (meaning this test can be used) for the duration of the COVID-19 declaration  under Section 564(b)(1) of the Act, 21 U.S.C.section 360bbb-3(b)(1), unless the authorization is terminated  or revoked sooner.       Influenza A by PCR NEGATIVE NEGATIVE Final   Influenza B by PCR NEGATIVE NEGATIVE Final    Comment: (NOTE) The Xpert Xpress SARS-CoV-2/FLU/RSV plus assay is intended as an aid in the diagnosis of influenza from Nasopharyngeal swab specimens and should not be used as a sole basis for treatment. Nasal washings and aspirates are unacceptable for Xpert Xpress SARS-CoV-2/FLU/RSV testing.  Fact Sheet for Patients: BloggerCourse.com  Fact Sheet for Healthcare Providers: SeriousBroker.it  This test is not yet approved or cleared by the [place]  FDA and has been authorized for detection and/or diagnosis of SARS-CoV-2 by FDA under an Emergency Use Authorization (EUA). This EUA will remain in effect (meaning this test can be used) for the duration of the COVID-19 declaration under Section 564(b)(1) of the Act, 21 U.S.C. section 360bbb-3(b)(1), unless the authorization is terminated or revoked.     Resp Syncytial Virus by PCR NEGATIVE NEGATIVE Final    Comment: (NOTE) Fact Sheet for Patients: BloggerCourse.com  Fact Sheet for Healthcare Providers: SeriousBroker.it  This test is not yet approved or cleared by the [place]  FDA and has been authorized for detection and/or diagnosis of SARS-CoV-2 by FDA under an Emergency Use Authorization (EUA). This EUA will remain in effect (meaning this test can be used) for the duration of the COVID-19 declaration under Section 564(b)(1) of the Act, 21 U.S.C. section 360bbb-3(b)(1), unless the authorization is terminated or revoked.  Performed at [facility], [address], [place], [place] [address]   Respiratory (~20 pathogens) panel by PCR     Status: None   Collection Time: 01/27/24  12:49 PM   Specimen: Nasopharyngeal Swab; Respiratory  Result Value Ref Range Status   Adenovirus NOT DETECTED NOT DETECTED Final   Coronavirus 229E NOT DETECTED NOT DETECTED Final    Comment: (NOTE) The Coronavirus on the Respiratory Panel, DOES NOT test for the novel  Coronavirus (2019 nCoV)    Coronavirus HKU1 NOT DETECTED NOT DETECTED Final   Coronavirus NL63 NOT DETECTED NOT DETECTED Final   Coronavirus OC43 NOT DETECTED NOT DETECTED Final   Metapneumovirus NOT DETECTED NOT DETECTED Final   Rhinovirus / Enterovirus NOT DETECTED NOT DETECTED Final   Influenza A NOT DETECTED NOT DETECTED Final   Influenza B NOT DETECTED NOT DETECTED Final   Parainfluenza Virus 1 NOT DETECTED NOT DETECTED Final   Parainfluenza Virus 2 NOT DETECTED NOT DETECTED Final   Parainfluenza Virus 3 NOT DETECTED NOT DETECTED Final   Parainfluenza Virus 4 NOT DETECTED NOT DETECTED Final   Respiratory Syncytial Virus NOT DETECTED NOT DETECTED Final   Bordetella pertussis  NOT DETECTED NOT DETECTED Final   Bordetella Parapertussis NOT DETECTED NOT DETECTED Final   Chlamydophila pneumoniae NOT DETECTED NOT DETECTED Final   Mycoplasma pneumoniae NOT DETECTED NOT DETECTED Final    Comment: Performed at [facility], [address] [place], [place] [address]  MRSA Next Gen by PCR, Nasal     Status: Abnormal   Collection Time: 01/28/24  2:37 AM   Specimen: Nasal Mucosa; Nasal Swab  Result Value Ref Range Status   MRSA by PCR Next Gen DETECTED (A) NOT DETECTED Final    Comment: (NOTE) The GeneXpert MRSA Assay (FDA approved for NASAL specimens only), is one component of a comprehensive MRSA colonization surveillance program. It is not intended to diagnose MRSA infection nor to guide or monitor treatment for MRSA infections. Test performance is not FDA approved in patients less than 66 years old. Performed at [facility], [address], [place], [place] [address]   Culture, blood  (Routine X 2) w Reflex to ID Panel     Status: None (Preliminary result)   Collection Time: 01/28/24 10:41 AM   Specimen: BLOOD  Result Value Ref Range Status   Specimen Description   Final    BLOOD SITE NOT SPECIFIED Performed at [facility], [address] [place], [place] [address]    Special Requests   Final    BOTTLES DRAWN AEROBIC ONLY Blood Culture results may not be optimal due to an inadequate volume of blood received in culture bottles Performed at [facility], [address], [place], [place] [address]    Culture   Final    NO GROWTH 4 DAYS Performed at [facility], [address] [place], [place] [address]    Report Status PENDING  Incomplete  Culture, blood (Routine X 2) w Reflex to ID Panel     Status: None (Preliminary result)   Collection Time: 01/28/24 10:46 AM   Specimen: BLOOD  Result Value Ref Range Status   Specimen Description   Final    BLOOD SITE NOT SPECIFIED Performed at [facility], [address], [place], [place] [address]    Special Requests   Final    BOTTLES DRAWN AEROBIC ONLY Blood Culture results may not be optimal due to an inadequate volume of blood received in culture bottles Performed at [facility], [address] [place], [place] [address]    Culture   Final    NO GROWTH 4 DAYS Performed at [facility], [address] [place], [place] [address]    Report Status PENDING  Incomplete     Labs: Basic Metabolic Panel: Recent Labs  Lab 01/27/24 1248 01/28/24 0009 01/29/24 0412 01/30/24 0429  NA 142 142 139 141  K 4.3 3.6 3.5 3.9  CL 107 105 103 106  CO2 24 25 23 23   GLUCOSE 101* 127* 128* 94  BUN 21 25* 29* 30*  CREATININE 1.34* 1.52* 1.50* 1.49*  CALCIUM  8.2* 7.8* 7.7* 7.6*   Liver Function Tests: Recent Labs  Lab 01/27/24 1248  AST 47*  ALT 39  ALKPHOS 113  BILITOT 0.8  PROT 6.9  ALBUMIN  3.4*   No results for input(s): LIPASE, AMYLASE in the  last 168 hours. No results for input(s): AMMONIA in the last 168 hours. CBC: Recent Labs  Lab 01/27/24 1248 01/28/24 0009 01/29/24 0412 01/30/24 0429  WBC 8.7 10.8* 12.3* 8.2  HGB 10.2* 9.3* 9.8* 9.6*  HCT 33.0* 29.9* 31.1* 31.7*  MCV 95.4 95.2 94.0 95.5  PLT 198 185 207 203   Cardiac Enzymes: No results for input(s): CKTOTAL, CKMB, CKMBINDEX, TROPONINI in the last 168 hours. BNP: BNP (last 3 results) Recent Labs    01/27/24 1249  BNP 122.7*    ProBNP (last 3 results) No results for input(s): PROBNP in the last 8760 hours.  CBG: No results for input(s): GLUCAP in the last 168 hours.     Signed:  Toribio Hummer MD.  [facility] 02/01/2024, 12:06 PM

## 2024-02-01 NOTE — Progress Notes (Signed)
 Patient discharged to [facility] via [facility].

## 2024-02-01 NOTE — TOC Progression Note (Addendum)
 Transition of Care [facility]) - Progression Note    Patient Details  Name: [name] MRN: [id number] Date of Birth: [birth year]  Transition of Care [facility]) CM/SW Contact  Tawni CHRISTELLA Eva, LCSW Phone Number: 02/01/2024, 10:37 AM  Clinical Narrative:     CSW spoke to Earnie, the admissions director at [facility]. She stated that she will need to assess the pt before the pt can return to the facility. She also mentioned that she will come to see the pt today. Care Management to  follow    Adden  11:30am CSW spoke with Earnie she has assess pt and determined pt can return to facility once stable for d/c. MD made aware. Care management to follow.   12:13pm Pt to d/c back to [facility], RN to call report to [phone] . [facility] called , Care Management sign off.    Expected Discharge Plan: Skilled Nursing Facility Barriers to Discharge: Continued Medical Work up  Expected Discharge Plan and Services   Discharge Planning Services: CM Consult   Living arrangements for the past 2 months: Assisted Living Facility                                       Social Determinants of Health (SDOH) Interventions SDOH Screenings   Food Insecurity: No Food Insecurity (01/28/2024)  Housing: Low Risk  (01/28/2024)  Transportation Needs: No Transportation Needs (01/28/2024)  Utilities: Not At Risk (01/28/2024)  Alcohol Screen: Low Risk  (08/10/2018)  Social Connections: Socially Isolated (01/28/2024)  Tobacco Use: Low Risk  (01/28/2024)    Readmission Risk Interventions     No data to display

## 2024-02-02 LAB — CULTURE, BLOOD (ROUTINE X 2)
Culture: NO GROWTH
Culture: NO GROWTH

## 2024-02-03 DIAGNOSIS — L0390 Cellulitis, unspecified: Secondary | ICD-10-CM | POA: Diagnosis not present

## 2024-02-03 DIAGNOSIS — M79605 Pain in left leg: Secondary | ICD-10-CM | POA: Diagnosis not present

## 2024-02-03 DIAGNOSIS — M79604 Pain in right leg: Secondary | ICD-10-CM | POA: Diagnosis not present

## 2024-02-11 ENCOUNTER — Other Ambulatory Visit: Payer: Self-pay

## 2024-02-11 ENCOUNTER — Encounter (HOSPITAL_COMMUNITY): Payer: Self-pay

## 2024-02-11 ENCOUNTER — Inpatient Hospital Stay (HOSPITAL_COMMUNITY)
Admission: EM | Admit: 2024-02-11 | Discharge: 2024-02-15 | DRG: 603 | Disposition: A | Discharge status: Skilled Nursing Facility | Source: Skilled Nursing Facility | Attending: Internal Medicine | Admitting: Internal Medicine

## 2024-02-11 DIAGNOSIS — E785 Hyperlipidemia, unspecified: Secondary | ICD-10-CM | POA: Diagnosis present

## 2024-02-11 DIAGNOSIS — Z981 Arthrodesis status: Secondary | ICD-10-CM

## 2024-02-11 DIAGNOSIS — N1831 Chronic kidney disease, stage 3a: Secondary | ICD-10-CM | POA: Diagnosis present

## 2024-02-11 DIAGNOSIS — I5032 Chronic diastolic (congestive) heart failure: Secondary | ICD-10-CM | POA: Diagnosis present

## 2024-02-11 DIAGNOSIS — G959 Disease of spinal cord, unspecified: Secondary | ICD-10-CM | POA: Diagnosis not present

## 2024-02-11 DIAGNOSIS — I10 Essential (primary) hypertension: Secondary | ICD-10-CM | POA: Diagnosis not present

## 2024-02-11 DIAGNOSIS — B372 Candidiasis of skin and nail: Secondary | ICD-10-CM | POA: Diagnosis present

## 2024-02-11 DIAGNOSIS — L0390 Cellulitis, unspecified: Secondary | ICD-10-CM | POA: Diagnosis not present

## 2024-02-11 DIAGNOSIS — L03119 Cellulitis of unspecified part of limb: Secondary | ICD-10-CM

## 2024-02-11 DIAGNOSIS — L03116 Cellulitis of left lower limb: Principal | ICD-10-CM | POA: Diagnosis present

## 2024-02-11 DIAGNOSIS — Z993 Dependence on wheelchair: Secondary | ICD-10-CM | POA: Diagnosis not present

## 2024-02-11 DIAGNOSIS — G3184 Mild cognitive impairment, so stated: Secondary | ICD-10-CM | POA: Diagnosis not present

## 2024-02-11 DIAGNOSIS — E1122 Type 2 diabetes mellitus with diabetic chronic kidney disease: Secondary | ICD-10-CM | POA: Diagnosis not present

## 2024-02-11 DIAGNOSIS — R5381 Other malaise: Secondary | ICD-10-CM | POA: Diagnosis not present

## 2024-02-11 DIAGNOSIS — R531 Weakness: Secondary | ICD-10-CM | POA: Diagnosis present

## 2024-02-11 DIAGNOSIS — E119 Type 2 diabetes mellitus without complications: Secondary | ICD-10-CM | POA: Diagnosis not present

## 2024-02-11 DIAGNOSIS — Z7982 Long term (current) use of aspirin: Secondary | ICD-10-CM

## 2024-02-11 DIAGNOSIS — Z79899 Other long term (current) drug therapy: Secondary | ICD-10-CM | POA: Diagnosis not present

## 2024-02-11 DIAGNOSIS — B379 Candidiasis, unspecified: Secondary | ICD-10-CM | POA: Diagnosis present

## 2024-02-11 DIAGNOSIS — F32A Depression, unspecified: Secondary | ICD-10-CM | POA: Diagnosis present

## 2024-02-11 DIAGNOSIS — I872 Venous insufficiency (chronic) (peripheral): Secondary | ICD-10-CM | POA: Diagnosis present

## 2024-02-11 DIAGNOSIS — I878 Other specified disorders of veins: Secondary | ICD-10-CM | POA: Diagnosis present

## 2024-02-11 DIAGNOSIS — R4189 Other symptoms and signs involving cognitive functions and awareness: Secondary | ICD-10-CM | POA: Diagnosis not present

## 2024-02-11 DIAGNOSIS — Z7952 Long term (current) use of systemic steroids: Secondary | ICD-10-CM

## 2024-02-11 DIAGNOSIS — K219 Gastro-esophageal reflux disease without esophagitis: Secondary | ICD-10-CM | POA: Diagnosis present

## 2024-02-11 DIAGNOSIS — G2581 Restless legs syndrome: Secondary | ICD-10-CM | POA: Diagnosis not present

## 2024-02-11 DIAGNOSIS — I890 Lymphedema, not elsewhere classified: Secondary | ICD-10-CM | POA: Diagnosis present

## 2024-02-11 DIAGNOSIS — Z820 Family history of epilepsy and other diseases of the nervous system: Secondary | ICD-10-CM

## 2024-02-11 DIAGNOSIS — I130 Hypertensive heart and chronic kidney disease with heart failure and stage 1 through stage 4 chronic kidney disease, or unspecified chronic kidney disease: Secondary | ICD-10-CM | POA: Diagnosis not present

## 2024-02-11 DIAGNOSIS — N183 Chronic kidney disease, stage 3 unspecified: Secondary | ICD-10-CM | POA: Diagnosis present

## 2024-02-11 DIAGNOSIS — Z91041 Radiographic dye allergy status: Secondary | ICD-10-CM

## 2024-02-11 DIAGNOSIS — D638 Anemia in other chronic diseases classified elsewhere: Secondary | ICD-10-CM | POA: Diagnosis not present

## 2024-02-11 DIAGNOSIS — M069 Rheumatoid arthritis, unspecified: Secondary | ICD-10-CM | POA: Diagnosis present

## 2024-02-11 DIAGNOSIS — M79604 Pain in right leg: Secondary | ICD-10-CM | POA: Diagnosis not present

## 2024-02-11 DIAGNOSIS — E039 Hypothyroidism, unspecified: Secondary | ICD-10-CM | POA: Diagnosis present

## 2024-02-11 DIAGNOSIS — M79605 Pain in left leg: Secondary | ICD-10-CM | POA: Diagnosis not present

## 2024-02-11 DIAGNOSIS — L03115 Cellulitis of right lower limb: Secondary | ICD-10-CM | POA: Diagnosis present

## 2024-02-11 DIAGNOSIS — F419 Anxiety disorder, unspecified: Secondary | ICD-10-CM | POA: Diagnosis present

## 2024-02-11 DIAGNOSIS — E7800 Pure hypercholesterolemia, unspecified: Secondary | ICD-10-CM | POA: Diagnosis present

## 2024-02-11 DIAGNOSIS — Z888 Allergy status to other drugs, medicaments and biological substances status: Secondary | ICD-10-CM

## 2024-02-11 DIAGNOSIS — Z85528 Personal history of other malignant neoplasm of kidney: Secondary | ICD-10-CM

## 2024-02-11 DIAGNOSIS — Z833 Family history of diabetes mellitus: Secondary | ICD-10-CM

## 2024-02-11 LAB — RETICULOCYTES
Immature Retic Fract: 16.6 % — ABNORMAL HIGH (ref 2.3–15.9)
RBC.: 3.45 MIL/uL — ABNORMAL LOW (ref 3.87–5.11)
Retic Count, Absolute: 80.7 K/uL (ref 19.0–186.0)
Retic Ct Pct: 2.3 % (ref 0.4–3.1)

## 2024-02-11 LAB — CBC WITH DIFFERENTIAL/PLATELET
Abs Immature Granulocytes: 0.03 K/uL (ref 0.00–0.07)
Basophils Absolute: 0.1 K/uL (ref 0.0–0.1)
Basophils Relative: 1 %
Eosinophils Absolute: 0.2 K/uL (ref 0.0–0.5)
Eosinophils Relative: 2 %
HCT: 32.7 % — ABNORMAL LOW (ref 36.0–46.0)
Hemoglobin: 9.8 g/dL — ABNORMAL LOW (ref 12.0–15.0)
Immature Granulocytes: 0 %
Lymphocytes Relative: 7 %
Lymphs Abs: 0.7 K/uL (ref 0.7–4.0)
MCH: 28.4 pg (ref 26.0–34.0)
MCHC: 30 g/dL (ref 30.0–36.0)
MCV: 94.8 fL (ref 80.0–100.0)
Monocytes Absolute: 0.8 K/uL (ref 0.1–1.0)
Monocytes Relative: 7 %
Neutro Abs: 9.1 K/uL — ABNORMAL HIGH (ref 1.7–7.7)
Neutrophils Relative %: 83 %
Platelets: 243 K/uL (ref 150–400)
RBC: 3.45 MIL/uL — ABNORMAL LOW (ref 3.87–5.11)
RDW: 14.9 % (ref 11.5–15.5)
WBC: 10.9 K/uL — ABNORMAL HIGH (ref 4.0–10.5)
nRBC: 0 % (ref 0.0–0.2)

## 2024-02-11 LAB — COMPREHENSIVE METABOLIC PANEL WITH GFR
ALT: 32 U/L (ref 0–44)
AST: 29 U/L (ref 15–41)
Albumin: 3.2 g/dL — ABNORMAL LOW (ref 3.5–5.0)
Alkaline Phosphatase: 116 U/L (ref 38–126)
Anion gap: 12 (ref 5–15)
BUN: 26 mg/dL — ABNORMAL HIGH (ref 8–23)
CO2: 25 mmol/L (ref 22–32)
Calcium: 7.9 mg/dL — ABNORMAL LOW (ref 8.9–10.3)
Chloride: 106 mmol/L (ref 98–111)
Creatinine, Ser: 1.34 mg/dL — ABNORMAL HIGH (ref 0.44–1.00)
GFR, Estimated: 41 mL/min — ABNORMAL LOW (ref >60)
Glucose, Bld: 139 mg/dL — ABNORMAL HIGH (ref 70–99)
Potassium: 4.4 mmol/L (ref 3.5–5.1)
Sodium: 143 mmol/L (ref 135–145)
Total Bilirubin: 0.3 mg/dL (ref 0.0–1.2)
Total Protein: 6.8 g/dL (ref 6.5–8.1)

## 2024-02-11 LAB — PHOSPHORUS: Phosphorus: 4.3 mg/dL (ref 2.5–4.6)

## 2024-02-11 LAB — FERRITIN: Ferritin: 28 ng/mL (ref 11–307)

## 2024-02-11 LAB — GLUCOSE, CAPILLARY: Glucose-Capillary: 106 mg/dL — ABNORMAL HIGH (ref 70–99)

## 2024-02-11 LAB — TROPONIN I (HIGH SENSITIVITY): Troponin I (High Sensitivity): 8 ng/L (ref <18)

## 2024-02-11 LAB — IRON AND TIBC
Iron: 27 ug/dL — ABNORMAL LOW (ref 28–170)
Saturation Ratios: 8 % — ABNORMAL LOW (ref 10.4–31.8)
TIBC: 323 ug/dL (ref 250–450)
UIBC: 296 ug/dL

## 2024-02-11 LAB — BRAIN NATRIURETIC PEPTIDE: B Natriuretic Peptide: 42 pg/mL (ref 0.0–100.0)

## 2024-02-11 LAB — MAGNESIUM: Magnesium: 2.4 mg/dL (ref 1.7–2.4)

## 2024-02-11 LAB — CK: Total CK: 211 U/L (ref 38–234)

## 2024-02-11 MED ORDER — ONDANSETRON HCL 4 MG PO TABS
4.0000 mg | ORAL_TABLET | Freq: Four times a day (QID) | ORAL | Status: DC | PRN
Start: 2024-02-11 — End: 2024-02-15

## 2024-02-11 MED ORDER — METOPROLOL SUCCINATE ER 25 MG PO TB24
25.0000 mg | ORAL_TABLET | Freq: Every day | ORAL | Status: DC
  Administered 2024-02-12 – 2024-02-15 (×2): 25 mg via ORAL
  Filled 2024-02-11 (×4): qty 1

## 2024-02-11 MED ORDER — INSULIN ASPART 100 UNIT/ML IJ SOLN
0.0000 [IU] | INTRAMUSCULAR | Status: DC
  Administered 2024-02-12: 1 [IU] via SUBCUTANEOUS
  Filled 2024-02-11: qty 0.09

## 2024-02-11 MED ORDER — POLYETHYLENE GLYCOL 3350 17 G PO PACK: 17.0000 g | PACK | Freq: Every day | ORAL | Status: DC | PRN

## 2024-02-11 MED ORDER — ASPIRIN 81 MG PO CHEW
81.0000 mg | CHEWABLE_TABLET | Freq: Every day | ORAL | Status: DC
  Administered 2024-02-12 – 2024-02-15 (×4): 81 mg via ORAL
  Filled 2024-02-11 (×4): qty 1

## 2024-02-11 MED ORDER — OLANZAPINE 5 MG PO TABS
5.0000 mg | ORAL_TABLET | Freq: Every day | ORAL | Status: DC
  Administered 2024-02-12 – 2024-02-15 (×4): 5 mg via ORAL
  Filled 2024-02-11 (×4): qty 1

## 2024-02-11 MED ORDER — HYDROCODONE-ACETAMINOPHEN 5-325 MG PO TABS
1.0000 | ORAL_TABLET | ORAL | Status: DC | PRN
  Administered 2024-02-12: 1 via ORAL
  Administered 2024-02-13: 2 via ORAL
  Administered 2024-02-14 (×2): 1 via ORAL
  Filled 2024-02-11 (×3): qty 1
  Filled 2024-02-11: qty 2

## 2024-02-11 MED ORDER — ACETAMINOPHEN 650 MG RE SUPP: 650.0000 mg | Freq: Four times a day (QID) | RECTAL | Status: DC | PRN

## 2024-02-11 MED ORDER — ONDANSETRON HCL 4 MG/2ML IJ SOLN: 4.0000 mg | Freq: Four times a day (QID) | INTRAMUSCULAR | Status: DC | PRN

## 2024-02-11 MED ORDER — ESCITALOPRAM OXALATE 10 MG PO TABS
15.0000 mg | ORAL_TABLET | Freq: Every day | ORAL | Status: DC
  Administered 2024-02-12 – 2024-02-15 (×4): 15 mg via ORAL
  Filled 2024-02-11 (×4): qty 2

## 2024-02-11 MED ORDER — CEFAZOLIN SODIUM-DEXTROSE 2-4 GM/100ML-% IV SOLN
2.0000 g | Freq: Two times a day (BID) | INTRAVENOUS | Status: DC
  Filled 2024-02-11: qty 100

## 2024-02-11 MED ORDER — SODIUM CHLORIDE 0.9 % IV SOLN: 250.0000 mL | INTRAVENOUS | Status: AC | PRN

## 2024-02-11 MED ORDER — SODIUM CHLORIDE 0.9% FLUSH
3.0000 mL | Freq: Two times a day (BID) | INTRAVENOUS | Status: DC
  Administered 2024-02-12 – 2024-02-15 (×8): 3 mL via INTRAVENOUS

## 2024-02-11 MED ORDER — BENZTROPINE MESYLATE 0.5 MG PO TABS
0.5000 mg | ORAL_TABLET | Freq: Two times a day (BID) | ORAL | Status: DC
Start: 2024-02-11 — End: 2024-02-15
  Administered 2024-02-11 – 2024-02-15 (×8): 0.5 mg via ORAL
  Filled 2024-02-11 (×8): qty 1

## 2024-02-11 MED ORDER — PREDNISONE 5 MG PO TABS
5.0000 mg | ORAL_TABLET | Freq: Every day | ORAL | Status: DC
  Administered 2024-02-12 – 2024-02-15 (×4): 5 mg via ORAL
  Filled 2024-02-11 (×4): qty 1

## 2024-02-11 MED ORDER — ALBUTEROL SULFATE (2.5 MG/3ML) 0.083% IN NEBU: 2.5000 mg | INHALATION_SOLUTION | RESPIRATORY_TRACT | Status: DC | PRN

## 2024-02-11 MED ORDER — SODIUM CHLORIDE 0.9 % IV SOLN
2.0000 g | Freq: Once | INTRAVENOUS | Status: AC
  Administered 2024-02-11: 2 g via INTRAVENOUS
  Filled 2024-02-11: qty 20

## 2024-02-11 MED ORDER — HYDROXYCHLOROQUINE SULFATE 200 MG PO TABS
600.0000 mg | ORAL_TABLET | Freq: Every day | ORAL | Status: DC
  Administered 2024-02-12 – 2024-02-15 (×4): 600 mg via ORAL
  Filled 2024-02-11 (×4): qty 3

## 2024-02-11 MED ORDER — ACETAMINOPHEN 325 MG PO TABS: 650.0000 mg | ORAL_TABLET | Freq: Four times a day (QID) | ORAL | Status: DC | PRN

## 2024-02-11 MED ORDER — FUROSEMIDE 20 MG PO TABS: 20.0000 mg | ORAL_TABLET | Freq: Every day | ORAL | Status: DC

## 2024-02-11 MED ORDER — LEFLUNOMIDE 10 MG PO TABS
20.0000 mg | ORAL_TABLET | Freq: Every day | ORAL | Status: DC
  Administered 2024-02-12 – 2024-02-15 (×4): 20 mg via ORAL
  Filled 2024-02-11 (×4): qty 2

## 2024-02-11 MED ORDER — ALPRAZOLAM 0.25 MG PO TABS
0.2500 mg | ORAL_TABLET | Freq: Two times a day (BID) | ORAL | Status: DC | PRN
  Administered 2024-02-13 – 2024-02-14 (×2): 0.25 mg via ORAL
  Filled 2024-02-11 (×2): qty 1

## 2024-02-11 MED ORDER — FENTANYL CITRATE PF 50 MCG/ML IJ SOSY: 12.5000 ug | PREFILLED_SYRINGE | INTRAMUSCULAR | Status: DC | PRN

## 2024-02-11 MED ORDER — NYSTATIN 100000 UNIT/GM EX POWD
Freq: Three times a day (TID) | CUTANEOUS | Status: DC
  Filled 2024-02-11: qty 15

## 2024-02-11 MED ORDER — SODIUM CHLORIDE 0.9% FLUSH: 3.0000 mL | INTRAVENOUS | Status: DC | PRN

## 2024-02-11 MED ORDER — CEFAZOLIN SODIUM-DEXTROSE 2-4 GM/100ML-% IV SOLN: 2.0000 g | Freq: Three times a day (TID) | INTRAVENOUS | Status: DC

## 2024-02-11 NOTE — Assessment & Plan Note (Signed)
 Chronic wheelchair-bound

## 2024-02-11 NOTE — Assessment & Plan Note (Signed)
 Chronic stable if blood pressure allows continue Toprol  25 mg daily

## 2024-02-11 NOTE — ED Triage Notes (Signed)
 PT arrives via EMS from [place] at [facility]. Pt has bilateral lower leg swelling and redness. PT completed Cephalexin  for cellulitis, but staff at facility report that the redness and swelling have not resolved.   PT is fully A&O. Non-ambulatory at baseline, but can briefly stand with X2 assist.

## 2024-02-11 NOTE — ED Notes (Signed)
 EDP at [facility]

## 2024-02-11 NOTE — Subjective & Objective (Signed)
 Has stasis dermatitis, both legs are very red, hot inflamed BNP wnl trop wnl WBC 10.89  She was on outpt Keflex  started  7/21 No fever On chronic prednisone  for RA

## 2024-02-11 NOTE — ED Provider Notes (Signed)
 [facility] Provider Note  CSN: [id number] Arrival date & time: 02/11/24 1635  Chief Complaint(s) Cellulitis  HPI [name] is a 78 y.o. female with PMH CKD 3, chronic bilateral lower extremity edema with venous stasis, HTN who presents emerged apartment for evaluation of lower extremity erythema pain and swelling.  Was recently admitted for fluid overload and hypoxia and was diuresed successfully.  Reportedly did have significant erythema of her lower extremities and venous stasis was noticed at time of hospital discharge and thus she was placed on a 7-day outpatient course of Keflex .  Has been taking his medications and recently completed the regimen but erythema and pain has worsened in the lower extremities.  She denies chest pain, shortness of breath, abdominal pain with nausea, vomiting or other systemic symptoms.   Past Medical History Past Medical History:  Diagnosis Date   Anemia    Anxiety    Arthritis    rhematoid,osteoarthritis   Asthma    mild   Cancer (HCC)    kidney   Diabetes mellitus without complication (HCC)    Dyspnea    increased exertion   Elevated cholesterol    GERD (gastroesophageal reflux disease)    Hemorrhoids    Hypertension    Hypothyroidism    Joint pain    Nocturia    Pneumonia    Restless leg syndrome 2025   Patient stated that she has had restless leg for about 10 years.  She stated that Neurontin  does not work for her.   Sleeping difficulties    Patient Active Problem List   Diagnosis Date Noted   Cellulitis of both lower extremities 02/12/2024   Cellulitis 02/11/2024   Candida infection 02/11/2024   Fever 01/28/2024   Venous stasis of both lower extremities 01/28/2024   Dyspnea 01/27/2024   Abdominal pain 10/30/2022   Abdominal fluid collection 10/30/2022   H/O transurethral resection of bladder tumor (TURBT) 10/30/2022   Bladder injury 10/30/2022   Acute renal failure superimposed on stage 3a chronic  kidney disease (HCC) 10/30/2022   Cervical myelopathy (HCC) 06/14/2021   Anxiety    CKD (chronic kidney disease), stage III (HCC)    Cervical stenosis of spinal canal    Hyperlipidemia associated with type 2 diabetes mellitus (HCC) 03/14/2021   Acute respiratory failure with hypoxia (HCC) 01/01/2021   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   Cognitive impairment 04/17/2019   Degeneration of lumbar intervertebral disc 10/15/2018   MDD (major depressive disorder), recurrent episode, severe (HCC) 08/10/2018   MDD (major depressive disorder), recurrent, severe, with psychosis (HCC)    Altered mental status 05/24/2018   Ketonuria 05/24/2018   Pain in right hand 09/11/2017   Essential hypertension 03/07/2014   Hyperlipidemia 03/07/2014   Depression 03/07/2014   Spinal stenosis of lumbar region with neurogenic claudication 02/24/2014   Type 2 diabetes mellitus without complication (HCC) 02/24/2014   Rheumatoid arthritis (HCC) 02/24/2014   Scoliosis of lumbar spine 02/13/2014   Home Medication(s) Prior to Admission medications   Medication Sig Start Date End Date Taking? Authorizing Provider  acetaminophen  (TYLENOL ) 500 MG tablet Take 1,000 mg by mouth every 6 (six) hours as needed for fever (or pain).   Yes [provider]  ALPRAZolam  (XANAX ) 0.25 MG tablet Take 1 tablet (0.25 mg total) by mouth 2 (two) times daily as needed for anxiety. 02/01/24  Yes Sebastian Toribio GAILS, MD  ascorbic acid (VITAMIN C) 500 MG tablet Take 500 mg by mouth every Monday, Wednesday, and Friday.  Yes [provider]  aspirin  81 MG chewable tablet Chew 81 mg by mouth daily. 05/24/21  Yes [provider]  Benzocaine-Menthol  (SORE THROAT) 15-3.6 MG LOZG Use as directed 1 lozenge in the mouth or throat every 2 (two) hours as needed (sore throat).   Yes [provider]  benztropine  (COGENTIN ) 0.5 MG tablet Take 0.5 mg by mouth in the morning and at bedtime.   Yes [provider]   Cholecalciferol  (VITAMIN D3) 50 MCG (2000 UT) TABS Take 2,000 Units by mouth daily.   Yes [provider]  Dextran 70-Hypromellose (NATURAL BALANCE TEARS OP) Place 1 drop into the right eye as needed (irritation/itching).   Yes [provider]  Dextromethorphan-guaiFENesin 5-100 MG/5ML LIQD Take 20 mLs by mouth every 4 (four) hours as needed (cough/congestion). 09/10/22  Yes [provider]  escitalopram  (LEXAPRO ) 10 MG tablet Take 15 mg by mouth daily.   Yes [provider]  ferrous sulfate  325 (65 FE) MG EC tablet Take 325 mg by mouth every Monday, Wednesday, and Friday.   Yes [provider]  hydroxychloroquine  (PLAQUENIL ) 200 MG tablet Take 3 tablets (600 mg total) by mouth daily. 02/05/24  Yes Sebastian Toribio GAILS, MD  hydrOXYzine  (ATARAX ) 50 MG tablet Take 50 mg by mouth in the morning and at bedtime.   Yes [provider]  ipratropium (ATROVENT) 0.03 % nasal spray Place 2 sprays into both nostrils 2 (two) times daily as needed for rhinitis. 05/16/22  Yes [provider]  LASIX  20 MG tablet Take 40 mg by mouth in the morning.   Yes [provider]  leflunomide  (ARAVA ) 20 MG tablet Take 1 tablet (20 mg total) by mouth daily. 02/05/24  Yes Sebastian Toribio GAILS, MD  loperamide (IMODIUM A-D) 2 MG tablet Take 2 mg by mouth as needed for diarrhea or loose stools (after each loose stool- MAX OF 3 TABLETS/24 HOURS).   Yes [provider]  loratadine  (CLARITIN ) 10 MG tablet Take 1 tablet (10 mg total) by mouth daily. 08/22/22  Yes Bernis Ernst, PA-C  melatonin 5 MG TABS Take 5 mg by mouth at bedtime.   Yes [provider]  Menthol -Zinc Oxide (CALMOSEPTINE) 0.44-20.6 % OINT Place 1 Application into the perineum 3 (three) times daily as needed (reason not listed).   Yes [provider]  metoprolol  succinate (TOPROL -XL) 25 MG 24 hr tablet Take 25 mg by mouth daily.   Yes [provider]  Multiple  Vitamins-Minerals (CENTRUM SILVER PO) Take 1 tablet by mouth daily with breakfast.   Yes [provider]  NON FORMULARY Place 1 application  into the right eye See admin instructions. Cool compress- 1 application to right eye as needed (reason not listed)   Yes [provider]  nystatin  cream (MYCOSTATIN ) Apply 1 Application topically 2 (two) times daily as needed (for redness- under abdominal folds and/or under the breasts). 07/24/22  Yes [provider]  nystatin  powder Apply 1 Application topically daily as needed (after bathing for brief change with excessive soiling).   Yes [provider]  OLANZapine  (ZYPREXA ) 5 MG tablet Take 5 mg by mouth daily.   Yes [provider]  polyethylene glycol (MIRALAX  / GLYCOLAX ) 17 g packet Take 17 g by mouth 2 (two) times daily. Patient taking differently: Take 17 g by mouth in the morning and at bedtime. 12/31/20  Yes Regalado, Belkys A, MD  predniSONE  (DELTASONE ) 5 MG tablet Take 5 mg by mouth daily.   Yes  [provider]  Psyllium (REGULOID PO) Take 0.4 g by mouth at bedtime.   Yes [provider]                                                                                                                                    Past Surgical History Past Surgical History:  Procedure Laterality Date   ABDOMINAL HYSTERECTOMY     ANTERIOR CERVICAL DECOMP/DISCECTOMY FUSION N/A 06/15/2021   Procedure: ANTERIOR CERVICAL DECOMPRESSION/DISCECTOMY FUSION CERVICAL THREE-FOUR;  Surgeon: Lanis Pupa, MD;  Location: [facility];  Service: Neurosurgery;  Laterality: N/A;   BACK SURGERY     BLADDER REPAIR N/A 10/30/2022   Procedure: BLADDER REPAIR;  Surgeon: Rosalind Zachary NOVAK, MD;  Location: [facility];  Service: Urology;  Laterality: N/A;   CERVICAL FUSION  2004   CHOLECYSTECTOMY     CYSTOSCOPY N/A 10/07/2022   Procedure: CYSTOSCOPY;  Surgeon: Devere Lonni Righter, MD;  Location: [facility];  Service: Urology;   Laterality: N/A;   EYE SURGERY Left    cataract   PARS PLANA REPAIR OF RETINAL DEATACHMENT     ROBOT ASSITED LAPAROSCOPIC NEPHROURETERECTOMY Left 12/26/2020   Procedure: XI ROBOT ASSITED LAPAROSCOPIC  LEFT NEPHROURETERECTOMY/ STENT PLACEMENT/ TRANSURETHRAL RESECTION OF LEFT URETERAL ORIFICE;EXCISION OF SPLENULE;  Surgeon: Devere Lonni Righter, MD;  Location: [facility];  Service: Urology;  Laterality: Left;   THYROIDECTOMY, PARTIAL     Family History Family History  Problem Relation Age of Onset   Diabetes Mother    Breast cancer Mother    Gout Mother    Alzheimer's disease Father    Heart Problems Father    Breast cancer Sister    Alzheimer's disease Sister    Diabetes Sister    Gout Sister    Heart Problems Sister    Alzheimer's disease Brother    Diabetes Brother    Heart Problems Brother     Social History Social History   Tobacco Use   Smoking status: Never   Smokeless tobacco: Never  Vaping Use   Vaping status: Never Used  Substance Use Topics   Alcohol use: No   Drug use: No   Allergies Iodinated contrast media, Abilify [aripiprazole], Ultram [tramadol hcl], and Zithromax [azithromycin]  Review of Systems Review of Systems  Skin:  Positive for rash.    Physical Exam Vital Signs  I have reviewed the triage vital signs BP (!) 123/51 (BP Location: Left Arm)   Pulse (!) 103   Temp 98.9 F (37.2 C) (Oral)   Resp 20   Ht 5' 4 (1.626 m)   Wt 77.1 kg   SpO2 93%   BMI 29.18 kg/m   Physical Exam Vitals and nursing note reviewed.  Constitutional:      General: She is not in acute distress.    Appearance: She is well-developed.  HENT:     Head: Normocephalic and atraumatic.  Eyes:  Conjunctiva/sclera: Conjunctivae normal.  Cardiovascular:     Rate and Rhythm: Normal rate and regular rhythm.     Heart sounds: No murmur heard. Pulmonary:     Effort: Pulmonary effort is normal. No respiratory distress.     Breath sounds: Normal breath sounds.   Abdominal:     Palpations: Abdomen is soft.     Tenderness: There is no abdominal tenderness.  Musculoskeletal:        General: No swelling.     Cervical back: Neck supple.     Right lower leg: Edema present.     Left lower leg: Edema present.  Skin:    General: Skin is warm and dry.     Capillary Refill: Capillary refill takes less than 2 seconds.     Findings: Erythema and rash present.  Neurological:     Mental Status: She is alert.  Psychiatric:        Mood and Affect: Mood normal.     ED Results and Treatments Labs (all labs ordered are listed, but only abnormal results are displayed) Labs Reviewed  MRSA NEXT GEN BY PCR, NASAL - Abnormal; Notable for the following components:      Result Value   MRSA by PCR Next Gen DETECTED (*)    All other components within normal limits  COMPREHENSIVE METABOLIC PANEL WITH GFR - Abnormal; Notable for the following components:   Glucose, Bld 139 (*)    BUN 26 (*)    Creatinine, Ser 1.34 (*)    Calcium  7.9 (*)    Albumin  3.2 (*)    GFR, Estimated 41 (*)    All other components within normal limits  CBC WITH DIFFERENTIAL/PLATELET - Abnormal; Notable for the following components:   WBC 10.9 (*)    RBC 3.45 (*)    Hemoglobin 9.8 (*)    HCT 32.7 (*)    Neutro Abs 9.1 (*)    All other components within normal limits  IRON AND TIBC - Abnormal; Notable for the following components:   Iron 27 (*)    Saturation Ratios 8 (*)    All other components within normal limits  RETICULOCYTES - Abnormal; Notable for the following components:   RBC. 3.45 (*)    Immature Retic Fract 16.6 (*)    All other components within normal limits  PREALBUMIN - Abnormal; Notable for the following components:   Prealbumin 15 (*)    All other components within normal limits  GLUCOSE, CAPILLARY - Abnormal; Notable for the following components:   Glucose-Capillary 106 (*)    All other components within normal limits  COMPREHENSIVE METABOLIC PANEL WITH GFR -  Abnormal; Notable for the following components:   Creatinine, Ser 1.30 (*)    Calcium  7.2 (*)    Total Protein 5.4 (*)    Albumin  2.6 (*)    GFR, Estimated 42 (*)    All other components within normal limits  CBC - Abnormal; Notable for the following components:   RBC 3.06 (*)    Hemoglobin 8.9 (*)    HCT 29.2 (*)    All other components within normal limits  GLUCOSE, CAPILLARY - Abnormal; Notable for the following components:   Glucose-Capillary 170 (*)    All other components within normal limits  GLUCOSE, CAPILLARY - Abnormal; Notable for the following components:   Glucose-Capillary 117 (*)    All other components within normal limits  BRAIN NATRIURETIC PEPTIDE  CK  MAGNESIUM   PHOSPHORUS  VITAMIN B12  FOLATE  FERRITIN  MAGNESIUM   PHOSPHORUS  GLUCOSE, CAPILLARY  HEMOGLOBIN A1C  TROPONIN I (HIGH SENSITIVITY)                                                                                                                          Radiology No results found.  Pertinent labs & imaging results that were available during my care of the patient were reviewed by me and considered in my medical decision making (see MDM for details).  Medications Ordered in ED Medications  aspirin  chewable tablet 81 mg (81 mg Oral Given 02/12/24 0936)  leflunomide  (ARAVA ) tablet 20 mg (20 mg Oral Given 02/12/24 0936)  hydroxychloroquine  (PLAQUENIL ) tablet 600 mg (600 mg Oral Given 02/12/24 0936)  metoprolol  succinate (TOPROL -XL) 24 hr tablet 25 mg (25 mg Oral Given 02/12/24 0935)  ALPRAZolam  (XANAX ) tablet 0.25 mg (has no administration in time range)  escitalopram  (LEXAPRO ) tablet 15 mg (15 mg Oral Given 02/12/24 0936)  OLANZapine  (ZYPREXA ) tablet 5 mg (5 mg Oral Given 02/12/24 0936)  predniSONE  (DELTASONE ) tablet 5 mg (5 mg Oral Given 02/12/24 0936)  polyethylene glycol (MIRALAX  / GLYCOLAX ) packet 17 g (has no administration in time range)  benztropine  (COGENTIN ) tablet 0.5 mg (0.5 mg Oral Given 02/12/24  0937)  acetaminophen  (TYLENOL ) tablet 650 mg (has no administration in time range)    Or  acetaminophen  (TYLENOL ) suppository 650 mg (has no administration in time range)  HYDROcodone -acetaminophen  (NORCO/VICODIN) 5-325 MG per tablet 1-2 tablet (has no administration in time range)  ondansetron  (ZOFRAN ) tablet 4 mg (has no administration in time range)    Or  ondansetron  (ZOFRAN ) injection 4 mg (has no administration in time range)  sodium chloride  flush (NS) 0.9 % injection 3 mL (3 mLs Intravenous Given 02/12/24 0937)  sodium chloride  flush (NS) 0.9 % injection 3 mL (has no administration in time range)  0.9 %  sodium chloride  infusion (has no administration in time range)  fentaNYL  (SUBLIMAZE ) injection 12.5-50 mcg (has no administration in time range)  albuterol  (PROVENTIL ) (2.5 MG/3ML) 0.083% nebulizer solution 2.5 mg (has no administration in time range)  insulin  aspart (novoLOG ) injection 0-9 Units ( Subcutaneous Not Given 02/12/24 1126)  nystatin  (MYCOSTATIN /NYSTOP ) topical powder ( Topical Given 02/12/24 0937)  ceFAZolin  (ANCEF ) IVPB 2g/100 mL premix (2 g Intravenous New Bag/Given 02/12/24 0614)  furosemide  (LASIX ) injection 40 mg (40 mg Intravenous Given 02/12/24 0936)  ferrous sulfate  tablet 325 mg (has no administration in time range)  loratadine  (CLARITIN ) tablet 10 mg (has no administration in time range)  cefTRIAXone  (ROCEPHIN ) 2 g in sodium chloride  0.9 % 100 mL IVPB (0 g Intravenous Stopped 02/11/24 1839)  Procedures Procedures  (including critical care time)  Medical Decision Making / ED Course   This patient presents to the ED for concern of rash, this involves an extensive number of treatment options, and is a complaint that carries with it a high risk of complications and morbidity.  The differential diagnosis includes venous stasis dermatitis,  cellulitis, DVT, erysipelas  MDM: Patient seen emergency room for evaluation of lower extremity erythema and pain.  Physical exam with well-demarcated erythematous and inflamed lesions over bilateral lower extremities likely consistent with venous stasis dermatitis but cellulitis does remain a possibility.  Laboratory evaluation with a leukocytosis to 10.9 which may be secondary to her chronic steroid use, hemoglobin 9.8, BUN 26, creatinine 1.34, BNP and troponin are normal.  Patient started on ceftriaxone  for cellulitis coverage and will require hospital admission.  Low suspicion the patient is fluid overloaded today and she is currently not hypoxic or short of breath.   Additional history obtained:  -External records from outside source obtained and reviewed including: Chart review including previous notes, labs, imaging, consultation notes   Lab Tests: -I ordered, reviewed, and interpreted labs.   The pertinent results include:   Labs Reviewed  MRSA NEXT GEN BY PCR, NASAL - Abnormal; Notable for the following components:      Result Value   MRSA by PCR Next Gen DETECTED (*)    All other components within normal limits  COMPREHENSIVE METABOLIC PANEL WITH GFR - Abnormal; Notable for the following components:   Glucose, Bld 139 (*)    BUN 26 (*)    Creatinine, Ser 1.34 (*)    Calcium  7.9 (*)    Albumin  3.2 (*)    GFR, Estimated 41 (*)    All other components within normal limits  CBC WITH DIFFERENTIAL/PLATELET - Abnormal; Notable for the following components:   WBC 10.9 (*)    RBC 3.45 (*)    Hemoglobin 9.8 (*)    HCT 32.7 (*)    Neutro Abs 9.1 (*)    All other components within normal limits  IRON AND TIBC - Abnormal; Notable for the following components:   Iron 27 (*)    Saturation Ratios 8 (*)    All other components within normal limits  RETICULOCYTES - Abnormal; Notable for the following components:   RBC. 3.45 (*)    Immature Retic Fract 16.6 (*)    All other components  within normal limits  PREALBUMIN - Abnormal; Notable for the following components:   Prealbumin 15 (*)    All other components within normal limits  GLUCOSE, CAPILLARY - Abnormal; Notable for the following components:   Glucose-Capillary 106 (*)    All other components within normal limits  COMPREHENSIVE METABOLIC PANEL WITH GFR - Abnormal; Notable for the following components:   Creatinine, Ser 1.30 (*)    Calcium  7.2 (*)    Total Protein 5.4 (*)    Albumin  2.6 (*)    GFR, Estimated 42 (*)    All other components within normal limits  CBC - Abnormal; Notable for the following components:   RBC 3.06 (*)    Hemoglobin 8.9 (*)    HCT 29.2 (*)    All other components within normal limits  GLUCOSE, CAPILLARY - Abnormal; Notable for the following components:   Glucose-Capillary 170 (*)    All other components within normal limits  GLUCOSE, CAPILLARY - Abnormal; Notable for the following components:   Glucose-Capillary 117 (*)    All other components within normal  limits  BRAIN NATRIURETIC PEPTIDE  CK  MAGNESIUM   PHOSPHORUS  VITAMIN B12  FOLATE  FERRITIN  MAGNESIUM   PHOSPHORUS  GLUCOSE, CAPILLARY  HEMOGLOBIN A1C  TROPONIN I (HIGH SENSITIVITY)     Medicines ordered and prescription drug management: Meds ordered this encounter  Medications   cefTRIAXone  (ROCEPHIN ) 2 g in sodium chloride  0.9 % 100 mL IVPB    Antibiotic Indication::   Cellulitis   DISCONTD: ceFAZolin  (ANCEF ) IVPB 2g/100 mL premix    Antibiotic Indication::   Cellulitis   aspirin  chewable tablet 81 mg   leflunomide  (ARAVA ) tablet 20 mg   hydroxychloroquine  (PLAQUENIL ) tablet 600 mg   DISCONTD: furosemide  (LASIX ) tablet 20 mg   metoprolol  succinate (TOPROL -XL) 24 hr tablet 25 mg   ALPRAZolam  (XANAX ) tablet 0.25 mg   escitalopram  (LEXAPRO ) tablet 15 mg   OLANZapine  (ZYPREXA ) tablet 5 mg   predniSONE  (DELTASONE ) tablet 5 mg   polyethylene glycol (MIRALAX  / GLYCOLAX ) packet 17 g   benztropine  (COGENTIN )  tablet 0.5 mg   OR Linked Order Group    acetaminophen  (TYLENOL ) tablet 650 mg    acetaminophen  (TYLENOL ) suppository 650 mg   HYDROcodone -acetaminophen  (NORCO/VICODIN) 5-325 MG per tablet 1-2 tablet   OR Linked Order Group    ondansetron  (ZOFRAN ) tablet 4 mg    ondansetron  (ZOFRAN ) injection 4 mg   sodium chloride  flush (NS) 0.9 % injection 3 mL   sodium chloride  flush (NS) 0.9 % injection 3 mL   0.9 %  sodium chloride  infusion   fentaNYL  (SUBLIMAZE ) injection 12.5-50 mcg   albuterol  (PROVENTIL ) (2.5 MG/3ML) 0.083% nebulizer solution 2.5 mg   DISCONTD: ceFAZolin  (ANCEF ) IVPB 2g/100 mL premix    Antibiotic Indication::   Cellulitis   insulin  aspart (novoLOG ) injection 0-9 Units    Correction coverage::   Sensitive (thin, NPO, renal)    CBG < 70::   Implement Hypoglycemia Standing Orders and refer to Hypoglycemia Standing Orders sidebar report    CBG 70 - 120::   0 units    CBG 121 - 150::   1 unit    CBG 151 - 200::   2 units    CBG 201 - 250::   3 units    CBG 251 - 300::   5 units    CBG 301 - 350::   7 units    CBG 351 - 400:   9 units    CBG > 400:   call MD and obtain STAT lab verification   nystatin  (MYCOSTATIN /NYSTOP ) topical powder   ceFAZolin  (ANCEF ) IVPB 2g/100 mL premix    Antibiotic Indication::   Cellulitis   furosemide  (LASIX ) injection 40 mg   ferrous sulfate  tablet 325 mg   loratadine  (CLARITIN ) tablet 10 mg    -I have reviewed the patients home medicines and have made adjustments as needed  Critical interventions none    Cardiac Monitoring: The patient was maintained on a cardiac monitor.  I personally viewed and interpreted the cardiac monitored which showed an underlying rhythm of: NSr  Social Determinants of Health:  Factors impacting patients care include: none   Reevaluation: After the interventions noted above, I reevaluated the patient and found that they have :stayed the same  Co morbidities that complicate the patient evaluation  Past  Medical History:  Diagnosis Date   Anemia    Anxiety    Arthritis    rhematoid,osteoarthritis   Asthma    mild   Cancer (HCC)    kidney   Diabetes mellitus without complication (HCC)  Dyspnea    increased exertion   Elevated cholesterol    GERD (gastroesophageal reflux disease)    Hemorrhoids    Hypertension    Hypothyroidism    Joint pain    Nocturia    Pneumonia    Restless leg syndrome 2025   Patient stated that she has had restless leg for about 10 years.  She stated that Neurontin  does not work for her.   Sleeping difficulties       Dispostion: I considered admission for this patient, and patient will require hospital admission for failure of outpatient antibiotics and venous stasis dermatitis versus cellulitis     Final Clinical Impression(s) / ED Diagnoses Final diagnoses:  Cellulitis, unspecified cellulitis site  Venous stasis     @PCDICTATION @    Albertina Dixon, MD 02/12/24 1315

## 2024-02-11 NOTE — Assessment & Plan Note (Signed)
 Pannus candidal infection Nystatin  powder

## 2024-02-11 NOTE — Assessment & Plan Note (Signed)
-  chronic avoid nephrotoxic medications such as NSAIDs, Vanco Zosyn combo,  avoid hypotension, continue to follow renal function

## 2024-02-11 NOTE — Assessment & Plan Note (Signed)
 Resulting in cellulitis

## 2024-02-11 NOTE — Assessment & Plan Note (Addendum)
 Order hemoglobin A1c Order sliding scale

## 2024-02-11 NOTE — Assessment & Plan Note (Signed)
Monitor for sun downing 

## 2024-02-11 NOTE — Assessment & Plan Note (Addendum)
 Due to venous stasis Continue ancef  IV Check MRSA

## 2024-02-11 NOTE — Assessment & Plan Note (Signed)
 Continue Plaquenil  and prednisone  at home doses

## 2024-02-11 NOTE — H&P (Signed)
 [name] FMW:[id number] DOB: 1946/03/08 DOA: 02/11/2024     PCP: Patient, No Pcp Per    Patient arrived to ER on 02/11/24 at 1635 Referred by Attending Kommor, Lum, MD   [facility]    [facility]   Chief Complaint:   Chief Complaint  Patient presents with   Cellulitis    HPI: [name] is a 78 y.o. female with medical history significant of HTN, CKD stage IIIa, RA on chronic prednisone , chronic anemia, chronic bilateral lower extremity edema with venous stasis, depression/anxiety, mild cognitive impairment      Presented with bilateral leg redness and swelling Has stasis dermatitis, both legs are very red, hot inflamed BNP wnl trop wnl WBC 10.89  She was on outpt Keflex  started  7/21 No fever On chronic prednisone  for RA    Recent admission for hypoxic respiratory failure  Denies significant ETOH intake   Does not smoke       Regarding pertinent Chronic problems:   Rheumatoid arthritis on Plaquenil  and prednisone     HTN on Toprol    chronic CHF diastolic - last echo  Recent Results (from the past 56199 hours)  ECHOCARDIOGRAM COMPLETE   Collection Time: 01/28/24  2:46 PM  Result Value   Weight 2,709.01   Height 64   BP 128/74   Single Plane A2C EF 70.5   Single Plane A4C EF 69.1   Calc EF 69.6   S' Lateral 2.20   P 1/2 time 315   Area-P 1/2 4.49   Est EF 65 - 70%   Narrative      ECHOCARDIOGRAM REPORT     IMPRESSIONS    1. Left ventricular ejection fraction, by estimation, is 65 to 70%. The left ventricle has normal function. The left ventricle has no regional wall motion abnormalities. Left ventricular diastolic parameters are consistent with Grade I diastolic  dysfunction (impaired relaxation).  2. Right ventricular systolic function is normal. The right ventricular size is normal. There is mildly elevated pulmonary artery systolic pressure. The estimated right ventricular systolic pressure is 41.2  mmHg.  3. The mitral valve is normal in structure. Mild mitral valve regurgitation. No evidence of mitral stenosis.  4. The aortic valve is tricuspid. Aortic valve regurgitation is mild to moderate. No aortic stenosis is present.  5. The inferior vena cava is dilated in size with <50% respiratory variability, suggesting right atrial pressure of 15 mmHg.             CKD stage IIIb baseline Cr 1.5 Estimated Creatinine Clearance: 34.8 mL/min (A) (by C-G formula based on SCr of 1.34 mg/dL (H)).  Lab Results  Component Value Date   CREATININE 1.34 (H) 02/11/2024   CREATININE 1.49 (H) 01/30/2024   CREATININE 1.50 (H) 01/29/2024   Lab Results  Component Value Date   NA 143 02/11/2024   CL 106 02/11/2024   K 4.4 02/11/2024   CO2 25 02/11/2024   BUN 26 (H) 02/11/2024   CREATININE 1.34 (H) 02/11/2024   GFRNONAA 41 (L) 02/11/2024   CALCIUM  7.9 (L) 02/11/2024   PHOS 2.6 12/31/2020   ALBUMIN  3.2 (L) 02/11/2024   GLUCOSE 139 (H) 02/11/2024       Dementia      Chronic anemia - baseline hg Hemoglobin & Hematocrit  Recent Labs    01/29/24 0412 01/30/24 0429 02/11/24 1727  HGB 9.8* 9.6* 9.8*     While in ER:   Worrisome for bilateral  cellulitis     Lab Orders         Comprehensive metabolic panel         CBC with Differential         Brain natriuretic peptide       Following Medications were ordered in ER: Medications  cefTRIAXone  (ROCEPHIN ) 2 g in sodium chloride  0.9 % 100 mL IVPB (0 g Intravenous Stopped 02/11/24 1839)    ___     ED Triage Vitals  Encounter Vitals Group     BP 02/11/24 1650 112/62     Girls Systolic BP Percentile --      Girls Diastolic BP Percentile --      Boys Systolic BP Percentile --      Boys Diastolic BP Percentile --      Pulse Rate 02/11/24 1650 91     Resp 02/11/24 1650 18     Temp 02/11/24 1655 98.5 F (36.9 C)     Temp Source 02/11/24 1655 Oral     SpO2 02/11/24 1650 100 %     Weight 02/11/24 1650 170 lb (77.1 kg)     Height  02/11/24 1650 5' 4 (1.626 m)     Head Circumference --      Peak Flow --      Pain Score --      Pain Loc --      Pain Education --      Exclude from Growth Chart --   UFJK(75)@     _________________________________________ Significant initial  Findings: Abnormal Labs Reviewed  COMPREHENSIVE METABOLIC PANEL WITH GFR - Abnormal; Notable for the following components:      Result Value   Glucose, Bld 139 (*)    BUN 26 (*)    Creatinine, Ser 1.34 (*)    Calcium  7.9 (*)    Albumin  3.2 (*)    GFR, Estimated 41 (*)    All other components within normal limits  CBC WITH DIFFERENTIAL/PLATELET - Abnormal; Notable for the following components:   WBC 10.9 (*)    RBC 3.45 (*)    Hemoglobin 9.8 (*)    HCT 32.7 (*)    Neutro Abs 9.1 (*)    All other components within normal limits      _________________________ Troponin   Cardiac Panel (last 3 results) Recent Labs    02/11/24 1727  TROPONINIHS 8     BNP (last 3 results) Recent Labs    01/27/24 1249 02/11/24 1727  BNP 122.7* 42.0      The recent clinical data is shown below. Vitals:   02/11/24 1650 02/11/24 1655 02/11/24 1800  BP: 112/62  110/66  Pulse: 91 90 94  Resp: 18  18  Temp:  98.5 F (36.9 C) 98.7 F (37.1 C)  TempSrc:  Oral   SpO2: 100%  97%  Weight: 77.1 kg    Height: 5' 4 (1.626 m)       WBC     Component Value Date/Time   WBC 10.9 (H) 02/11/2024 1727   LYMPHSABS 0.7 02/11/2024 1727   MONOABS 0.8 02/11/2024 1727   EOSABS 0.2 02/11/2024 1727   BASOSABS 0.1 02/11/2024 1727       Results for orders placed or performed during the hospital encounter of 01/27/24  Respiratory (~20 pathogens) panel by PCR     Status: None   Collection Time: 01/27/24 12:49 PM   Specimen: Nasopharyngeal Swab; Respiratory  Result Value Ref Range Status   Adenovirus NOT DETECTED NOT  DETECTED Final   Coronavirus 229E NOT DETECTED NOT DETECTED Final    Comment: (NOTE) The Coronavirus on the Respiratory Panel, DOES NOT  test for the novel  Coronavirus (2019 nCoV)    Coronavirus HKU1 NOT DETECTED NOT DETECTED Final   Coronavirus NL63 NOT DETECTED NOT DETECTED Final   Coronavirus OC43 NOT DETECTED NOT DETECTED Final   Metapneumovirus NOT DETECTED NOT DETECTED Final   Rhinovirus / Enterovirus NOT DETECTED NOT DETECTED Final   Influenza A NOT DETECTED NOT DETECTED Final   Influenza B NOT DETECTED NOT DETECTED Final   Parainfluenza Virus 1 NOT DETECTED NOT DETECTED Final   Parainfluenza Virus 2 NOT DETECTED NOT DETECTED Final   Parainfluenza Virus 3 NOT DETECTED NOT DETECTED Final   Parainfluenza Virus 4 NOT DETECTED NOT DETECTED Final   Respiratory Syncytial Virus NOT DETECTED NOT DETECTED Final   Bordetella pertussis NOT DETECTED NOT DETECTED Final   Bordetella Parapertussis NOT DETECTED NOT DETECTED Final   Chlamydophila pneumoniae NOT DETECTED NOT DETECTED Final   Mycoplasma pneumoniae NOT DETECTED NOT DETECTED Final    Comment: Performed at [facility], [address], [place], [place] [address]  MRSA Next Gen by PCR, Nasal     Status: Abnormal   Collection Time: 01/28/24  2:37 AM   Specimen: Nasal Mucosa; Nasal Swab  Result Value Ref Range Status   MRSA by PCR Next Gen DETECTED (A) NOT DETECTED Final    Comment: (NOTE) The GeneXpert MRSA Assay (FDA approved for NASAL specimens only), is one component of a comprehensive MRSA colonization surveillance program. It is not intended to diagnose MRSA infection nor to guide or monitor treatment for MRSA infections. Test performance is not FDA approved in patients less than 41 years old. Performed at [facility], [address] [place], [place] [address]   Culture, blood (Routine X 2) w Reflex to ID Panel     Status: None   Collection Time: 01/28/24 10:41 AM   Specimen: BLOOD  Result Value Ref Range Status   Specimen Description   Final    BLOOD SITE NOT SPECIFIED Performed at [facility], [address]  [place], [place] [address]    Special Requests   Final    BOTTLES DRAWN AEROBIC ONLY Blood Culture results may not be optimal due to an inadequate volume of blood received in culture bottles Performed at [facility], [address], [place], [place] [address]    Culture   Final    NO GROWTH 5 DAYS Performed at [facility], [address], [place], [place] [address]    Report Status 02/02/2024 FINAL  Final  Culture, blood (Routine X 2) w Reflex to ID Panel     Status: None   Collection Time: 01/28/24 10:46 AM   Specimen: BLOOD  Result Value Ref Range Status   Specimen Description   Final    BLOOD SITE NOT SPECIFIED Performed at [facility], [address], [place], [place] [address]    Special Requests   Final    BOTTLES DRAWN AEROBIC ONLY Blood Culture results may not be optimal due to an inadequate volume of blood received in culture bottles Performed at [facility], [address] [place], [place] [address]    Culture   Final    NO GROWTH 5 DAYS Performed at [facility], [address] [place], [place] [address]    Report Status 02/02/2024 FINAL  Final    ABX started Antibiotics Given (last 72 hours)     Date/Time Action Medication Dose Rate   02/11/24 1802 New Bag/Given   cefTRIAXone  (ROCEPHIN ) 2 g in sodium chloride  0.9 % 100 mL IVPB 2 g 200 mL/hr         __________________________________________________________ Recent Labs  Lab 02/11/24 1727  NA 143  K 4.4  CO2 25  GLUCOSE 139*  BUN 26*  CREATININE 1.34*  CALCIUM  7.9*    Cr    stable,  Lab Results  Component Value Date   CREATININE 1.34 (H) 02/11/2024   CREATININE 1.49 (H) 01/30/2024   CREATININE 1.50 (H) 01/29/2024    Recent Labs  Lab 02/11/24 1727  AST 29  ALT 32  ALKPHOS 116  BILITOT 0.3  PROT 6.8  ALBUMIN  3.2*   Lab Results  Component Value Date   CALCIUM  7.9 (L) 02/11/2024   PHOS 2.6 12/31/2020       Plt: Lab Results   Component Value Date   PLT 243 02/11/2024       Recent Labs  Lab 02/11/24 1727  WBC 10.9*  NEUTROABS 9.1*  HGB 9.8*  HCT 32.7*  MCV 94.8  PLT 243    HG/HCT   stable,      Component Value Date/Time   HGB 9.8 (L) 02/11/2024 1727   HCT 32.7 (L) 02/11/2024 1727   HCT 34.7 05/25/2018 1125   MCV 94.8 02/11/2024 1727     _______________________________________________ Hospitalist was called for admission for   Cellulitis,  Venous stasis     The following Work up has been ordered so far:  Orders Placed This Encounter  Procedures   Comprehensive metabolic panel   CBC with Differential   Brain natriuretic peptide   Consult to hospitalist     OTHER Significant initial  Findings:  labs showing:     DM  labs:  HbA1C: No results for input(s): HGBA1C in the last 8760 hours.     CBG (last 3)  No results for input(s): GLUCAP in the last 72 hours.        Cultures:    Component Value Date/Time   SDES  01/28/2024 1046    BLOOD SITE NOT SPECIFIED Performed at [facility], [address], [place], [place] [address]    SPECREQUEST  01/28/2024 1046    BOTTLES DRAWN AEROBIC ONLY Blood Culture results may not be optimal due to an inadequate volume of blood received in culture bottles Performed at [facility], [address] [place], [place] [address]    CULT  01/28/2024 1046    NO GROWTH 5 DAYS Performed at [facility], [address] [place], [place] [address]    REPTSTATUS 02/02/2024 FINAL 01/28/2024 1046     Radiological Exams on Admission: No results found. _______________________________________________________________________________________________________ Latest  Blood pressure 110/66, pulse 94, temperature 98.7 F (37.1 C), resp. rate 18, height 5' 4 (1.626 m), weight 77.1 kg, SpO2 97%.   Vitals  labs and radiology finding personally reviewed  Review of Systems:    Pertinent positives include:   fatigue  Bilateral lower extremity swelling   Constitutional:  No weight loss, night sweats, Fevers, chills,, weight loss  HEENT:  No headaches, Difficulty swallowing,Tooth/dental problems,Sore throat,  No sneezing, itching, ear ache, nasal congestion, post nasal drip,  Cardio-vascular:  No chest pain, Orthopnea, PND, anasarca, dizziness, palpitations.no  GI:  No heartburn, indigestion, abdominal pain, nausea, vomiting, diarrhea, change in bowel habits, loss  of appetite, melena, blood in stool, hematemesis Resp:  no shortness of breath at rest. No dyspnea on exertion, No excess mucus, no productive cough, No non-productive cough, No coughing up of blood.No change in color of mucus.No wheezing. Skin:  no rash or lesions. No jaundice GU:  no dysuria, change in color of urine, no urgency or frequency. No straining to urinate.  No flank pain.  Musculoskeletal:  No joint pain or no joint swelling. No decreased range of motion. No back pain.  Psych:  No change in mood or affect. No depression or anxiety. No memory loss.  Neuro: no localizing neurological complaints, no tingling, no weakness, no double vision, no gait abnormality, no slurred speech, no confusion  All systems reviewed and apart from HOPI all are negative _______________________________________________________________________________________________ Past Medical History:   Past Medical History:  Diagnosis Date   Anemia    Anxiety    Arthritis    rhematoid,osteoarthritis   Asthma    mild   Cancer (HCC)    kidney   Diabetes mellitus without complication (HCC)    Dyspnea    increased exertion   Elevated cholesterol    GERD (gastroesophageal reflux disease)    Hemorrhoids    Hypertension    Hypothyroidism    Joint pain    Nocturia    Pneumonia    Restless leg syndrome 2025   Patient stated that she has had restless leg for about 10 years.  She stated that Neurontin  does not work for her.   Sleeping difficulties        Past Surgical History:  Procedure Laterality Date   ABDOMINAL HYSTERECTOMY     ANTERIOR CERVICAL DECOMP/DISCECTOMY FUSION N/A 06/15/2021   Procedure: ANTERIOR CERVICAL DECOMPRESSION/DISCECTOMY FUSION CERVICAL THREE-FOUR;  Surgeon: Lanis Pupa, MD;  Location: [facility] OR;  Service: Neurosurgery;  Laterality: N/A;   BACK SURGERY     BLADDER REPAIR N/A 10/30/2022   Procedure: BLADDER REPAIR;  Surgeon: Rosalind Zachary NOVAK, MD;  Location: [facility] ORS;  Service: Urology;  Laterality: N/A;   CERVICAL FUSION  2004   CHOLECYSTECTOMY     CYSTOSCOPY N/A 10/07/2022   Procedure: CYSTOSCOPY;  Surgeon: Devere Lonni Righter, MD;  Location: [facility] ORS;  Service: Urology;  Laterality: N/A;   EYE SURGERY Left    cataract   PARS PLANA REPAIR OF RETINAL DEATACHMENT     ROBOT ASSITED LAPAROSCOPIC NEPHROURETERECTOMY Left 12/26/2020   Procedure: XI ROBOT ASSITED LAPAROSCOPIC  LEFT NEPHROURETERECTOMY/ STENT PLACEMENT/ TRANSURETHRAL RESECTION OF LEFT URETERAL ORIFICE;EXCISION OF SPLENULE;  Surgeon: Devere Lonni Righter, MD;  Location: [facility] ORS;  Service: Urology;  Laterality: Left;   THYROIDECTOMY, PARTIAL      Social History:  Ambulatory  wheelchair bound     reports that she has never smoked. She has never used smokeless tobacco. She reports that she does not drink alcohol and does not use drugs.     Family History:  Family History  Problem Relation Age of Onset   Diabetes Mother    Breast cancer Mother    Gout Mother    Alzheimer's disease Father    Heart Problems Father    Breast cancer Sister    Alzheimer's disease Sister    Diabetes Sister    Gout Sister    Heart Problems Sister    Alzheimer's disease Brother    Diabetes Brother    Heart Problems Brother    ______________________________________________________________________________________________ Allergies: Allergies  Allergen Reactions   Iodinated Contrast Media Anaphylaxis, Hives and Other (See Comments)  Only the retina dye-  Allergic, per [facility]   Abilify [Aripiprazole] Other (See Comments)    Allergic, per [facility]   Ultram [Tramadol Hcl] Other (See Comments)    Allergic, per [facility]   Zithromax [Azithromycin] Anxiety and Other (See Comments)    Heightens anxiety and Allergic, per [facility]     Prior to Admission medications   Medication Sig Start Date End Date Taking? Authorizing Provider  acetaminophen  (TYLENOL ) 500 MG tablet Take 1,000 mg by mouth every 6 (six) hours as needed for fever (or pain).    [provider]  ALPRAZolam  (XANAX ) 0.25 MG tablet Take 1 tablet (0.25 mg total) by mouth 2 (two) times daily as needed for anxiety. 02/01/24   Sebastian Toribio GAILS, MD  ascorbic acid (VITAMIN C) 500 MG tablet Take 500 mg by mouth 3 (three) times a week.    [provider]  aspirin  81 MG chewable tablet Chew 81 mg by mouth daily. 05/24/21   [provider]  Benzocaine-Menthol  (SORE THROAT) 15-3.6 MG LOZG Use as directed 1 lozenge in the mouth or throat every 2 (two) hours as needed (sore throat).    [provider]  benztropine  (COGENTIN ) 0.5 MG tablet Take 0.5 mg by mouth in the morning and at bedtime.    [provider]  Cholecalciferol  (VITAMIN D ) 50 MCG (2000 UT) CAPS Take 2,000 Units by mouth daily.    [provider]  Dextran 70-Hypromellose (NATURAL BALANCE TEARS OP) Place 1 drop into the right eye as needed (irritation/itching).    [provider]  Dextromethorphan-guaiFENesin 5-100 MG/5ML LIQD Take 20 mLs by mouth every 4 (four) hours as needed (cough/congestion). 09/10/22   [provider]  escitalopram  (LEXAPRO ) 10 MG tablet Take 15 mg by mouth daily.    [provider]  ferrous sulfate  325 (65 FE) MG EC tablet Take 325 mg by mouth 3 (three) times a week.    [provider]  hydroxychloroquine  (PLAQUENIL ) 200 MG tablet Take 3 tablets (600 mg total) by mouth daily. 02/05/24   Sebastian Toribio GAILS, MD  hydrOXYzine  (VISTARIL ) 50 MG  capsule Take 50 mg by mouth in the morning and at bedtime. 09/17/22   [provider]  ipratropium (ATROVENT) 0.03 % nasal spray Place 2 sprays into both nostrils 2 (two) times daily as needed for rhinitis. 05/16/22   [provider]  LASIX  20 MG tablet Take 20 mg by mouth daily.    [provider]  leflunomide  (ARAVA ) 20 MG tablet Take 1 tablet (20 mg total) by mouth daily. 02/05/24   Sebastian Toribio GAILS, MD  loperamide (IMODIUM A-D) 2 MG tablet Take 2 mg by mouth as needed for diarrhea or loose stools (after each loose stool- MAX OF 3 TABLETS/24 HOURS).    [provider]  loratadine  (CLARITIN ) 10 MG tablet Take 1 tablet (10 mg total) by mouth daily. 08/22/22   Bernis Ernst, PA-C  melatonin 5 MG TABS Take 5 mg by mouth at bedtime.    [provider]  Menthol -Zinc Oxide (CALMOSEPTINE) 0.44-20.6 % OINT Place 1 Application into the perineum 3 (three) times daily as needed (reason not listed).    [provider]  metoprolol  succinate (TOPROL -XL) 25 MG 24 hr tablet Take 25 mg by mouth daily.    [provider]  Multiple Vitamins-Minerals (CENTRUM SILVER PO) Take 1 tablet by mouth daily.    [provider]  NON FORMULARY Place 1 application  into the right eye See admin instructions.  Cool compress- 1 application to right eye as needed (reason not listed)    [provider]  nystatin  cream (MYCOSTATIN ) Apply 1 Application topically 2 (two) times daily as needed (for redness- under abdominal folds and/or under the breasts). 07/24/22   [provider]  nystatin  powder Apply 1 Application topically daily as needed (after bathing for brief change with excessive soiling).    [provider]  OLANZapine  (ZYPREXA ) 5 MG tablet Take 5 mg by mouth daily.    [provider]  polyethylene glycol (MIRALAX  / GLYCOLAX ) 17 g packet Take 17 g by mouth 2 (two) times daily. Patient taking differently: Take 17 g by mouth in  the morning and at bedtime. 12/31/20   Regalado, Belkys A, MD  predniSONE  (DELTASONE ) 5 MG tablet Take 5 mg by mouth daily.    [provider]  Psyllium (REGULOID PO) Take 0.4 g by mouth at bedtime.    [provider]  Urea  40 % LOTN Apply 1 Application topically 2 (two) times daily as needed (dryness/itching).    [provider]    ___________________________________________________________________________________________________ Physical Exam:    02/11/2024    6:00 PM 02/11/2024    4:55 PM 02/11/2024    4:50 PM  Vitals with BMI  Height   5' 4  Weight   170 lbs  BMI   29.17  Systolic 110  112  Diastolic 66  62  Pulse 94 90 91     1. General:  in No  Acute distress    Chronically ill   -appearing 2. Psychological: Alert and   Oriented 3. Head/ENT:    Dry Mucous Membranes                          Head Non traumatic, neck supple                         Poor Dentition 4. SKIN:  decreased Skin turgor,  Skin clean Dry and intact redness and scaling of the lower extremities bilaterally Candidal infection of the lower pannus    5. Heart: Regular rate and rhythm no  Murmur, no Rub or gallop 6. Lungs:  , no wheezes or crackles   7. Abdomen: Soft,  non-tender, Non distended   obese  bowel sounds present 8. Lower extremities: no clubbing, cyanosis, bilaterally edema 9. Neurologically generally fatigued 10. MSK: Normal range of motion    Chart has been reviewed  ______________________________________________________________________________________________  Assessment/Plan 78 y.o. female with medical history significant of HTN, CKD stage IIIa, RA on chronic prednisone , chronic anemia, chronic bilateral lower extremity edema with venous stasis, depression/anxiety, mild cognitive impairment    Admitted for   Cellulitis,    Venous stasis    Present on Admission:  CKD (chronic kidney disease), stage III (HCC)  Cognitive impairment  Hyperlipidemia  Venous  stasis of both lower extremities  Cellulitis  Cervical myelopathy (HCC)  Essential hypertension  Rheumatoid arthritis (HCC)  Candida infection     CKD (chronic kidney disease), stage III (HCC)  -chronic avoid nephrotoxic medications such as NSAIDs, Vanco Zosyn combo,  avoid hypotension, continue to follow renal function   Cognitive impairment Monitor for sundowning  Hyperlipidemia Not on statin  Type 2 diabetes mellitus without complication (HCC) Order hemoglobin A1c Order sliding scale  Venous stasis of both lower extremities Resulting in cellulitis  Cellulitis Due to venous stasis Continue ancef  IV Check MRSA  Cervical  myelopathy (HCC) Chronic wheelchair-bound  Essential hypertension Chronic stable if blood pressure allows continue Toprol  25 mg daily  Rheumatoid arthritis (HCC) Continue Plaquenil  and prednisone  at home doses  Candida infection Pannus candidal infection Nystatin  powder   Other plan as per orders.  DVT prophylaxis:  SCD    Code Status:    Code Status: Prior FULL CODE  as per patient   I had personally discussed CODE STATUS with patient  ACP   none    Family Communication:   Family not at  Bedside    Diet diabetic   Disposition Plan:                              Back to current facility when stable                              Following barriers for discharge:                              able to transition to PO antibiotics                             Will need to be able to tolerate PO                                  Consult Orders  (From admission, onward)           Start     Ordered   02/11/24 1822  Consult to hospitalist  Once       Provider:  (Not yet assigned)  Question Answer Comment  Place call to: [facility]   Reason for Consult Admit      02/11/24 1821                              Transition of care consulted                     Consults called:    NONE   Admission status:  ED Disposition      ED Disposition  Admit   Condition  --   Comment  Hospital Area: [facility] [[id number]]  Level of Care: Telemetry [5]  Admit to tele based on following criteria: Other see comments  Comments: hx of chf  May place patient in observation at [facility] or [facility] if equivalent level of care is available:: No  Covid Evaluation: Asymptomatic - no recent exposure (last 10 days) testing not required  Diagnosis: Cellulitis [[id number]]  Admitting Physician: Alexsus Papadopoulos [[id number]]  Attending Physician: Joaquim Tolen [[id number]]  For patients discharging to extended facilities (i.e. SNF, AL, group homes or LTAC) initiate:: Discharge to SNF/Facility Placement COVID-19 Lab Testing Protocol           Obs      Level of care     tele  For 12H    Cattleya Dobratz 02/11/2024, 9:32 PM    [facility]     after 2 AM please page floor coverage   If 7AM-7PM, please contact the day team taking care of the patient using [email or web address]

## 2024-02-11 NOTE — ED Notes (Signed)
 ED TO INPATIENT HANDOFF REPORT  ED Nurse Name and Phone #: Joaquim [phone]  S Name/Age/Gender [name] Cough 78 y.o. female Room/Bed: WA08/WA08  Code Status   Code Status: Full Code  Home/SNF/Other Nursing Home Patient oriented to: self, place, time, and situation Is this baseline? Yes   Triage Complete: Triage complete  Chief Complaint Cellulitis [L03.90]  Triage Note PT arrives via EMS from [facility]. Pt has bilateral lower leg swelling and redness. PT completed Cephalexin  for cellulitis, but staff at facility report that the redness and swelling have not resolved.   PT is fully A&O. Non-ambulatory at baseline, but can briefly stand with X2 assist.     Allergies Allergies  Allergen Reactions   Iodinated Contrast Media Anaphylaxis, Hives and Other (See Comments)    Only the retina dye- Allergic, per [facility]   Abilify [Aripiprazole] Other (See Comments)    Allergic, per [facility]   Ultram [Tramadol Hcl] Other (See Comments)    Allergic, per [facility]   Zithromax [Azithromycin] Anxiety and Other (See Comments)    Heightens anxiety and Allergic, per [facility]    Level of Care/Admitting Diagnosis ED Disposition     ED Disposition  Admit   Condition  --   Comment  Hospital Area: [facility]  HOSPITAL [[id number]]  Level of Care: Telemetry [5]  Admit to tele based on following criteria: Other see comments  Comments: hx of chf  May place patient in observation at [facility] or [facility] if equivalent level of care is available:: No  Covid Evaluation: Asymptomatic - no recent exposure (last 10 days) testing not required  Diagnosis: Cellulitis [807680]  Admitting Physician: DOUTOVA, ANASTASSIA [[id number]]  Attending Physician: DOUTOVA, ANASTASSIA [[id number]]  For patients discharging to extended facilities (i.e. SNF, AL, group homes or LTAC) initiate:: Discharge to SNF/Facility Placement COVID-19 Lab Testing Protocol          B Medical/Surgery  History Past Medical History:  Diagnosis Date   Anemia    Anxiety    Arthritis    rhematoid,osteoarthritis   Asthma    mild   Cancer (HCC)    kidney   Diabetes mellitus without complication (HCC)    Dyspnea    increased exertion   Elevated cholesterol    GERD (gastroesophageal reflux disease)    Hemorrhoids    Hypertension    Hypothyroidism    Joint pain    Nocturia    Pneumonia    Restless leg syndrome 2025   Patient stated that she has had restless leg for about 10 years.  She stated that Neurontin  does not work for her.   Sleeping difficulties    Past Surgical History:  Procedure Laterality Date   ABDOMINAL HYSTERECTOMY     ANTERIOR CERVICAL DECOMP/DISCECTOMY FUSION N/A 06/15/2021   Procedure: ANTERIOR CERVICAL DECOMPRESSION/DISCECTOMY FUSION CERVICAL THREE-FOUR;  Surgeon: Lanis Pupa, MD;  Location: [facility] OR;  Service: Neurosurgery;  Laterality: N/A;   BACK SURGERY     BLADDER REPAIR N/A 10/30/2022   Procedure: BLADDER REPAIR;  Surgeon: Rosalind Zachary NOVAK, MD;  Location: [facility] ORS;  Service: Urology;  Laterality: N/A;   CERVICAL FUSION  2004   CHOLECYSTECTOMY     CYSTOSCOPY N/A 10/07/2022   Procedure: CYSTOSCOPY;  Surgeon: Devere Lonni Righter, MD;  Location: [facility] ORS;  Service: Urology;  Laterality: N/A;   EYE SURGERY Left    cataract   PARS PLANA REPAIR OF RETINAL DEATACHMENT     ROBOT ASSITED LAPAROSCOPIC NEPHROURETERECTOMY Left 12/26/2020  Procedure: XI ROBOT ASSITED LAPAROSCOPIC  LEFT NEPHROURETERECTOMY/ STENT PLACEMENT/ TRANSURETHRAL RESECTION OF LEFT URETERAL ORIFICE;EXCISION OF SPLENULE;  Surgeon: Devere Lonni Righter, MD;  Location: [facility] ORS;  Service: Urology;  Laterality: Left;   THYROIDECTOMY, PARTIAL       A IV Location/Drains/Wounds Patient Lines/Drains/Airways Status     Active Line/Drains/Airways     Name Placement date Placement time Site Days   Peripheral IV 02/11/24 20 G 1 Left Antecubital 02/11/24  1734  Antecubital  less than 1    Wound / Incision (Open or Dehisced) 11/01/22 Non-pressure wound Leg Left;Anterior Full thickness wound with 1 measurable area of epidermal loss with surounding erythemia 11/01/22  0820  Leg  467   Wound / Incision (Open or Dehisced) 11/01/22 Non-pressure wound Leg Left;Posterior;Distal Full thickness wound with 1 area of measurable epidermal loss 11/01/22  0820  Leg  467   Wound / Incision (Open or Dehisced) 11/01/22 Non-pressure wound Leg Right;Anterior;Distal Full thickness wound with 2 measurable areas of epidermal loss with surronding erythemia 11/01/22  0820  Leg  467   Wound / Incision (Open or Dehisced) 11/01/22 Tibial Posterior;Right full thickness wound with 1 measurable are of epidermal loss with surrounding erythemia 11/01/22  0820  Tibial  467            Intake/Output Last 24 hours  Intake/Output Summary (Last 24 hours) at 02/11/2024 2051 Last data filed at 02/11/2024 1839 Gross per 24 hour  Intake 100 ml  Output --  Net 100 ml    Labs/Imaging Results for orders placed or performed during the hospital encounter of 02/11/24 (from the past 48 hours)  Comprehensive metabolic panel     Status: Abnormal   Collection Time: 02/11/24  5:27 PM  Result Value Ref Range   Sodium 143 135 - 145 mmol/L   Potassium 4.4 3.5 - 5.1 mmol/L   Chloride 106 98 - 111 mmol/L   CO2 25 22 - 32 mmol/L   Glucose, Bld 139 (H) 70 - 99 mg/dL    Comment: Glucose reference range applies only to samples taken after fasting for at least 8 hours.   BUN 26 (H) 8 - 23 mg/dL   Creatinine, Ser 8.65 (H) 0.44 - 1.00 mg/dL   Calcium  7.9 (L) 8.9 - 10.3 mg/dL   Total Protein 6.8 6.5 - 8.1 g/dL   Albumin  3.2 (L) 3.5 - 5.0 g/dL   AST 29 15 - 41 U/L   ALT 32 0 - 44 U/L   Alkaline Phosphatase 116 38 - 126 U/L   Total Bilirubin 0.3 0.0 - 1.2 mg/dL   GFR, Estimated 41 (L) >60 mL/min    Comment: (NOTE) Calculated using the CKD-EPI Creatinine Equation (2021)    Anion gap 12 5 - 15    Comment: Performed at [facility], [address] [place], [place] [address]  Troponin I (High Sensitivity)     Status: None   Collection Time: 02/11/24  5:27 PM  Result Value Ref Range   Troponin I (High Sensitivity) 8 <18 ng/L    Comment: (NOTE) Elevated high sensitivity troponin I (hsTnI) values and significant  changes across serial measurements may suggest ACS but many other  chronic and acute conditions are known to elevate hsTnI results.  Refer to the Links section for chest pain algorithms and additional  guidance. Performed at [facility], [address] [place], [place] [address]   CBC with Differential     Status: Abnormal  Collection Time: 02/11/24  5:27 PM  Result Value Ref Range   WBC 10.9 (H) 4.0 - 10.5 K/uL   RBC 3.45 (L) 3.87 - 5.11 MIL/uL   Hemoglobin 9.8 (L) 12.0 - 15.0 g/dL   HCT 67.2 (L) 63.9 - 53.9 %   MCV 94.8 80.0 - 100.0 fL   MCH 28.4 26.0 - 34.0 pg   MCHC 30.0 30.0 - 36.0 g/dL   RDW 85.0 88.4 - 84.4 %   Platelets 243 150 - 400 K/uL   nRBC 0.0 0.0 - 0.2 %   Neutrophils Relative % 83 %   Neutro Abs 9.1 (H) 1.7 - 7.7 K/uL   Lymphocytes Relative 7 %   Lymphs Abs 0.7 0.7 - 4.0 K/uL   Monocytes Relative 7 %   Monocytes Absolute 0.8 0.1 - 1.0 K/uL   Eosinophils Relative 2 %   Eosinophils Absolute 0.2 0.0 - 0.5 K/uL   Basophils Relative 1 %   Basophils Absolute 0.1 0.0 - 0.1 K/uL   Immature Granulocytes 0 %   Abs Immature Granulocytes 0.03 0.00 - 0.07 K/uL    Comment: Performed at [facility], [address] [place], [place] [address]  Brain natriuretic peptide     Status: None   Collection Time: 02/11/24  5:27 PM  Result Value Ref Range   B Natriuretic Peptide 42.0 0.0 - 100.0 pg/mL    Comment: Performed at [facility], [address] [place], [place] [address]   No results found.  Pending Labs Unresulted Labs (From admission, onward)     Start     Ordered   02/12/24 0500  Vitamin B12   (Anemia Panel (PNL))  Tomorrow morning,   R        02/11/24 1947   02/12/24 0500  Folate  (Anemia Panel (PNL))  Tomorrow morning,   R        02/11/24 1947   02/12/24 0500  Prealbumin  Tomorrow morning,   R        02/11/24 1947   02/11/24 1953  MRSA Next Gen by PCR, Nasal  Once,   R        02/11/24 1952   02/11/24 1952  Hemoglobin A1c  Add-on,   AD        02/11/24 1951   02/11/24 1948  CK  Add-on,   AD        02/11/24 1947   02/11/24 1948  Magnesium   Add-on,   AD        02/11/24 1947   02/11/24 1948  Phosphorus  Add-on,   AD        02/11/24 1947   02/11/24 1948  Iron and TIBC  (Anemia Panel (PNL))  Add-on,   AD        02/11/24 1947   02/11/24 1948  Ferritin  (Anemia Panel (PNL))  Add-on,   AD        02/11/24 1947   02/11/24 1948  Reticulocytes  (Anemia Panel (PNL))  Add-on,   AD        02/11/24 1947   Signed and Held  Magnesium   Tomorrow morning,   R        Signed and Held   Signed and Held  Phosphorus  Tomorrow morning,   R        Signed and Held   Signed and Held  Comprehensive metabolic panel  Tomorrow morning,   R       Question:  Release to patient  Answer:  Immediate   Signed and Held   Signed and Held  CBC  Tomorrow morning,   R       Question:  Release to patient  Answer:  Immediate   Signed and Held            Vitals/Pain Today's Vitals   02/11/24 1650 02/11/24 1655 02/11/24 1800  BP: 112/62  110/66  Pulse: 91 90 94  Resp: 18  18  Temp:  98.5 F (36.9 C) 98.7 F (37.1 C)  TempSrc:  Oral   SpO2: 100%  97%  Weight: 77.1 kg    Height: 5' 4 (1.626 m)      Isolation Precautions No active isolations  Medications Medications  ceFAZolin  (ANCEF ) IVPB 2g/100 mL premix (has no administration in time range)  cefTRIAXone  (ROCEPHIN ) 2 g in sodium chloride  0.9 % 100 mL IVPB (0 g Intravenous Stopped 02/11/24 1839)    Mobility non-ambulatory     Focused Assessments    R Recommendations: See Admitting Provider Note  Report given to:   Additional  Notes:

## 2024-02-11 NOTE — ED Notes (Signed)
 PT provided with blanket per request.

## 2024-02-11 NOTE — Assessment & Plan Note (Signed)
 Not on statin

## 2024-02-12 DIAGNOSIS — Z743 Need for continuous supervision: Secondary | ICD-10-CM | POA: Diagnosis not present

## 2024-02-12 DIAGNOSIS — Z7401 Bed confinement status: Secondary | ICD-10-CM | POA: Diagnosis not present

## 2024-02-12 DIAGNOSIS — L03115 Cellulitis of right lower limb: Secondary | ICD-10-CM | POA: Diagnosis present

## 2024-02-12 DIAGNOSIS — I872 Venous insufficiency (chronic) (peripheral): Secondary | ICD-10-CM | POA: Diagnosis present

## 2024-02-12 DIAGNOSIS — Z79899 Other long term (current) drug therapy: Secondary | ICD-10-CM | POA: Diagnosis not present

## 2024-02-12 DIAGNOSIS — I878 Other specified disorders of veins: Secondary | ICD-10-CM | POA: Diagnosis present

## 2024-02-12 DIAGNOSIS — L03116 Cellulitis of left lower limb: Secondary | ICD-10-CM | POA: Diagnosis present

## 2024-02-12 DIAGNOSIS — F419 Anxiety disorder, unspecified: Secondary | ICD-10-CM | POA: Diagnosis present

## 2024-02-12 DIAGNOSIS — G2581 Restless legs syndrome: Secondary | ICD-10-CM | POA: Diagnosis present

## 2024-02-12 DIAGNOSIS — Z981 Arthrodesis status: Secondary | ICD-10-CM | POA: Diagnosis not present

## 2024-02-12 DIAGNOSIS — Z993 Dependence on wheelchair: Secondary | ICD-10-CM | POA: Diagnosis not present

## 2024-02-12 DIAGNOSIS — F32A Depression, unspecified: Secondary | ICD-10-CM | POA: Diagnosis present

## 2024-02-12 DIAGNOSIS — N1831 Chronic kidney disease, stage 3a: Secondary | ICD-10-CM | POA: Diagnosis present

## 2024-02-12 DIAGNOSIS — G959 Disease of spinal cord, unspecified: Secondary | ICD-10-CM | POA: Diagnosis present

## 2024-02-12 DIAGNOSIS — B372 Candidiasis of skin and nail: Secondary | ICD-10-CM | POA: Diagnosis present

## 2024-02-12 DIAGNOSIS — E7800 Pure hypercholesterolemia, unspecified: Secondary | ICD-10-CM | POA: Diagnosis present

## 2024-02-12 DIAGNOSIS — R404 Transient alteration of awareness: Secondary | ICD-10-CM | POA: Diagnosis not present

## 2024-02-12 DIAGNOSIS — I5032 Chronic diastolic (congestive) heart failure: Secondary | ICD-10-CM | POA: Diagnosis present

## 2024-02-12 DIAGNOSIS — D638 Anemia in other chronic diseases classified elsewhere: Secondary | ICD-10-CM | POA: Diagnosis present

## 2024-02-12 DIAGNOSIS — G3184 Mild cognitive impairment, so stated: Secondary | ICD-10-CM | POA: Diagnosis present

## 2024-02-12 DIAGNOSIS — M069 Rheumatoid arthritis, unspecified: Secondary | ICD-10-CM | POA: Diagnosis present

## 2024-02-12 DIAGNOSIS — E1122 Type 2 diabetes mellitus with diabetic chronic kidney disease: Secondary | ICD-10-CM | POA: Diagnosis present

## 2024-02-12 DIAGNOSIS — L0390 Cellulitis, unspecified: Secondary | ICD-10-CM | POA: Diagnosis present

## 2024-02-12 DIAGNOSIS — R531 Weakness: Secondary | ICD-10-CM | POA: Diagnosis present

## 2024-02-12 DIAGNOSIS — I130 Hypertensive heart and chronic kidney disease with heart failure and stage 1 through stage 4 chronic kidney disease, or unspecified chronic kidney disease: Secondary | ICD-10-CM | POA: Diagnosis present

## 2024-02-12 DIAGNOSIS — R5381 Other malaise: Secondary | ICD-10-CM | POA: Diagnosis present

## 2024-02-12 DIAGNOSIS — E039 Hypothyroidism, unspecified: Secondary | ICD-10-CM | POA: Diagnosis present

## 2024-02-12 DIAGNOSIS — Z7982 Long term (current) use of aspirin: Secondary | ICD-10-CM | POA: Diagnosis not present

## 2024-02-12 DIAGNOSIS — L03119 Cellulitis of unspecified part of limb: Secondary | ICD-10-CM | POA: Diagnosis not present

## 2024-02-12 LAB — VITAMIN B12: Vitamin B-12: 252 pg/mL (ref 180–914)

## 2024-02-12 LAB — MAGNESIUM: Magnesium: 2 mg/dL (ref 1.7–2.4)

## 2024-02-12 LAB — COMPREHENSIVE METABOLIC PANEL WITH GFR
ALT: 27 U/L (ref 0–44)
AST: 24 U/L (ref 15–41)
Albumin: 2.6 g/dL — ABNORMAL LOW (ref 3.5–5.0)
Alkaline Phosphatase: 94 U/L (ref 38–126)
Anion gap: 8 (ref 5–15)
BUN: 22 mg/dL (ref 8–23)
CO2: 23 mmol/L (ref 22–32)
Calcium: 7.2 mg/dL — ABNORMAL LOW (ref 8.9–10.3)
Chloride: 110 mmol/L (ref 98–111)
Creatinine, Ser: 1.3 mg/dL — ABNORMAL HIGH (ref 0.44–1.00)
GFR, Estimated: 42 mL/min — ABNORMAL LOW (ref >60)
Glucose, Bld: 88 mg/dL (ref 70–99)
Potassium: 3.8 mmol/L (ref 3.5–5.1)
Sodium: 141 mmol/L (ref 135–145)
Total Bilirubin: 0.4 mg/dL (ref 0.0–1.2)
Total Protein: 5.4 g/dL — ABNORMAL LOW (ref 6.5–8.1)

## 2024-02-12 LAB — CBC
HCT: 29.2 % — ABNORMAL LOW (ref 36.0–46.0)
Hemoglobin: 8.9 g/dL — ABNORMAL LOW (ref 12.0–15.0)
MCH: 29.1 pg (ref 26.0–34.0)
MCHC: 30.5 g/dL (ref 30.0–36.0)
MCV: 95.4 fL (ref 80.0–100.0)
Platelets: 204 K/uL (ref 150–400)
RBC: 3.06 MIL/uL — ABNORMAL LOW (ref 3.87–5.11)
RDW: 14.9 % (ref 11.5–15.5)
WBC: 7.8 K/uL (ref 4.0–10.5)
nRBC: 0 % (ref 0.0–0.2)

## 2024-02-12 LAB — FOLATE: Folate: 36.2 ng/mL (ref >5.9)

## 2024-02-12 LAB — GLUCOSE, CAPILLARY
Glucose-Capillary: 110 mg/dL — ABNORMAL HIGH (ref 70–99)
Glucose-Capillary: 117 mg/dL — ABNORMAL HIGH (ref 70–99)
Glucose-Capillary: 133 mg/dL — ABNORMAL HIGH (ref 70–99)
Glucose-Capillary: 170 mg/dL — ABNORMAL HIGH (ref 70–99)
Glucose-Capillary: 99 mg/dL (ref 70–99)

## 2024-02-12 LAB — PHOSPHORUS: Phosphorus: 3.3 mg/dL (ref 2.5–4.6)

## 2024-02-12 LAB — MRSA NEXT GEN BY PCR, NASAL: MRSA by PCR Next Gen: DETECTED — AB

## 2024-02-12 LAB — PREALBUMIN: Prealbumin: 15 mg/dL — ABNORMAL LOW (ref 18–38)

## 2024-02-12 MED ORDER — LORATADINE 10 MG PO TABS
10.0000 mg | ORAL_TABLET | Freq: Every day | ORAL | Status: DC
  Administered 2024-02-12 – 2024-02-15 (×4): 10 mg via ORAL
  Filled 2024-02-12 (×4): qty 1

## 2024-02-12 MED ORDER — CEFAZOLIN SODIUM-DEXTROSE 2-4 GM/100ML-% IV SOLN
2.0000 g | Freq: Three times a day (TID) | INTRAVENOUS | Status: DC
  Administered 2024-02-12 – 2024-02-15 (×10): 2 g via INTRAVENOUS
  Filled 2024-02-12 (×10): qty 100

## 2024-02-12 MED ORDER — FERROUS SULFATE 325 (65 FE) MG PO TABS
325.0000 mg | ORAL_TABLET | ORAL | Status: DC
  Administered 2024-02-12 – 2024-02-15 (×2): 325 mg via ORAL
  Filled 2024-02-12 (×2): qty 1

## 2024-02-12 MED ORDER — FUROSEMIDE 10 MG/ML IJ SOLN
40.0000 mg | Freq: Every day | INTRAMUSCULAR | Status: DC
  Administered 2024-02-12: 40 mg via INTRAVENOUS
  Filled 2024-02-12 (×2): qty 4

## 2024-02-12 NOTE — Consult Note (Signed)
 WOC Nurse Consult Note:  WOC consult performed remotely utilizing chart review and imaging. Reason for Consult:lower extremity wounds Wound type: intact purulent filled blisters, full thickness wound to L leg in setting of venous insufficiency, chronic leg changes noted  Pressure Injury POA: NA Measurement: see nursing flow sheets Wound bed: red, moist Drainage (amount, consistency, odor) see nursing flow sheets Periwound: erythema Dressing procedure/placement/frequency:  Cleanse wound with NS, pat dry.  Apply Xeroform to wound bed and cover with Silicone foam dressing. Wrap legs with Kerlix, covered with Ace wrap from the base of the toes to right below the knee. Change daily.  WOC team will not follow at this time. Please re-consult if new needs arise.  Thank you,  Doyal Polite, RN, MSN, [facility] WOC Team

## 2024-02-12 NOTE — Plan of Care (Signed)
  Problem: Clinical Measurements: Goal: Respiratory complications will improve Outcome: Progressing   Problem: Activity: Goal: Risk for activity intolerance will decrease Outcome: Progressing   Problem: Nutrition: Goal: Adequate nutrition will be maintained Outcome: Progressing   Problem: Pain Managment: Goal: General experience of comfort will improve and/or be controlled Outcome: Progressing   Problem: Safety: Goal: Ability to remain free from injury will improve Outcome: Progressing   Problem: Clinical Measurements: Goal: Ability to avoid or minimize complications of infection will improve Outcome: Progressing

## 2024-02-12 NOTE — Evaluation (Signed)
 Physical Therapy Evaluation Patient Details Name: [name] MRN: [id number] DOB: [birth year] Today's Date: 02/12/2024  History of Present Illness  78 yo female presents to therapy following hospital admission on 02/11/2024 due to B LE edema, calor and rubor with dx of B LE cellulitis. Pt PMH includes but is limited to: HTN, CKD III, RA, anemia, B LE edema with venous stasis, depression, anxiety, bladder perforation s/p exp lap, resection of papillary bladder tumor and mild cognitive impairment.  Clinical Impression    Pt admitted with above diagnosis.  Pt currently with functional limitations due to the deficits listed below (see PT Problem List). Pt in bed when PT arrived. Nursing staff completing B LE dressing changes. Pt required encouragement for participation with therapy. Once seated EOB with S pt indicated need to void bladder, mod A for squat pivot transfer bed <> BSC, pt then agreeable to sitting in recliner and transfer as above bed to recliner with mod A. Pt left seated in recliner and all needs in place. Pt will benefit from acute skilled PT to increase their independence and safety with mobility to allow discharge.         If plan is discharge home, recommend the following: A lot of help with walking and/or transfers;A lot of help with bathing/dressing/bathroom;Assistance with cooking/housework;Assist for transportation   Can travel by private vehicle        Equipment Recommendations None recommended by PT  Recommendations for Other Services       Functional Status Assessment Patient has had a recent decline in their functional status and demonstrates the ability to make significant improvements in function in a reasonable and predictable amount of time.     Precautions / Restrictions Precautions Precautions: Fall Restrictions Weight Bearing Restrictions Per Provider Order: No      Mobility  Bed Mobility Overal bed mobility: Needs Assistance Bed Mobility: Supine  to Sit     Supine to sit: Supervision, HOB elevated, Used rails     General bed mobility comments: min cues    Transfers Overall transfer level: Needs assistance Equipment used: None (pt declined RW) Transfers: Bed to chair/wheelchair/BSC       Squat pivot transfers: Mod assist     General transfer comment: mod A and cues with pt reporting she likes assist from under the arm only for transfers bed <> BSC and then bed to recliner    Ambulation/Gait               General Gait Details: NT pt declined  Stairs            Wheelchair Mobility     Tilt Bed    Modified Rankin (Stroke Patients Only)       Balance Overall balance assessment: Needs assistance Sitting-balance support: Feet supported Sitting balance-Leahy Scale: Fair     Standing balance support: Single extremity supported, During functional activity Standing balance-Leahy Scale: Poor                               Pertinent Vitals/Pain Pain Assessment Pain Assessment: Faces Faces Pain Scale: Hurts even more Pain Location: B distal LE Pain Descriptors / Indicators: Aching, Burning, Constant, Discomfort Pain Intervention(s): Limited activity within patient's tolerance, Monitored during session, Premedicated before session, Repositioned    Home Living Family/patient expects to be discharged to:: Assisted living  Additional Comments: [facility]    Prior Function Prior Level of Function : Needs assist       Physical Assist : ADLs (physical);Mobility (physical) Mobility (physical): Transfers;Gait ADLs (physical): IADLs;Toileting;Dressing;Bathing Mobility Comments: pt reports she is able to transfer from bed <> wc and wc<> commode, pt is able to self propel wc with B LE and wc is primary means of facilitiy mobiltiy, pt reports able to amb short distances with RW when working with therapy at ALF. ADLs Comments: pt requires A for bathing  tasks, dressing, meals, medication management and IADLs.     Extremity/Trunk Assessment        Lower Extremity Assessment Lower Extremity Assessment: Generalized weakness    Cervical / Trunk Assessment Cervical / Trunk Assessment: Kyphotic  Communication   Communication Communication: No apparent difficulties    Cognition Arousal: Alert Behavior During Therapy: WFL for tasks assessed/performed   PT - Cognitive impairments: No apparent impairments                         Following commands: Intact       Cueing       General Comments General comments (skin integrity, edema, etc.): Nursing staff completing B LE dressings as PT arrived    Exercises     Assessment/Plan    PT Assessment Patient needs continued PT services  PT Problem List Decreased strength;Decreased range of motion;Decreased activity tolerance;Decreased balance;Decreased mobility;Decreased coordination;Pain       PT Treatment Interventions DME instruction;Gait training;Functional mobility training;Therapeutic activities;Therapeutic exercise;Balance training;Neuromuscular re-education;Patient/family education    PT Goals (Current goals can be found in the Care Plan section)  Acute Rehab PT Goals Patient Stated Goal: to figure out where I am living PT Goal Formulation: With patient Time For Goal Achievement: 02/26/24 Potential to Achieve Goals: Good    Frequency Min 2X/week     Co-evaluation               AM-PAC PT 6 Clicks Mobility  Outcome Measure Help needed turning from your back to your side while in a flat bed without using bedrails?: None Help needed moving from lying on your back to sitting on the side of a flat bed without using bedrails?: A Little Help needed moving to and from a bed to a chair (including a wheelchair)?: A Little Help needed standing up from a chair using your arms (e.g., wheelchair or bedside chair)?: A Little Help needed to walk in hospital  room?: Total Help needed climbing 3-5 steps with a railing? : Total 6 Click Score: 15    End of Session Equipment Utilized During Treatment: Gait belt Activity Tolerance: Patient limited by fatigue;Patient limited by pain Patient left: in chair;with chair alarm set;with call bell/phone within reach Nurse Communication: Mobility status PT Visit Diagnosis: Unsteadiness on feet (R26.81);Other abnormalities of gait and mobility (R26.89);Muscle weakness (generalized) (M62.81);Difficulty in walking, not elsewhere classified (R26.2);Pain Pain - Right/Left:  (B) Pain - part of body: Leg;Ankle and joints of foot    Time: 8446-8378 PT Time Calculation (min) (ACUTE ONLY): 28 min   Charges:   PT Evaluation $PT Eval Low Complexity: 1 Low PT Treatments $Therapeutic Activity: 8-22 mins PT General Charges $$ ACUTE PT VISIT: 1 Visit         [place], PT Acute Rehab   [place] VEAR Drone 02/12/2024, 7:15 PM

## 2024-02-12 NOTE — Progress Notes (Signed)
 PROGRESS NOTE    [name]  FMW:[id number] DOB: 01-Sep-1945 DOA: 02/11/2024 PCP: Patient, No Pcp Per    Brief Narrative:   [name] is a 78 y.o. female with past medical history significant for HTN, CKD stage IIIa, RA on chronic prednisone , anemia of chronic medical disease, chronic bilateral lower extremity edema with venous stasis, depression/anxiety, mild cognitive impairment who presented to [facility] ED on 02/11/[facility]24 [facility] with concerns for progressive lower extremity edema/cellulitis nonresponsive to oral antibiotic therapy outpatient.  Patient reports issues to her legs going on for some time.  Denies fever, no chills.  At baseline wheelchair dependent but able to assist with transfers.  In the ED, temperature 98.5 F, HR 90, RR 18, BP 112/62, SpO2 100% on room air.  WBC 10.9, hemoglobin 9.8, platelet count 243.  Sodium 143, potassium 4.4, chloride 106, CO2 25, glucose 139, BUN 26, creat 1.34.  AST 29, ALT 32, total bilirubin 0.3.  TRH consulted for admission for further evaluation management of bilateral lower extremity cellulitis not responsive to oral antibiotic treatment outpatient.  Assessment & Plan:   Bilateral lower extremity cellulitis with lymphedema/venous stasis Patient presenting with progressive lower extremity edema, erythema despite outpatient oral antibiotic use.  Likely complicated by her chronic lymphedema/venous stasis.  Most recent TTE 01/28/2024 notable for grade 1 diastolic dysfunction, LVEF 65 to 70% with IVC dilated.  Patient is afebrile with elevated WBC count of 10.9. -- WBC 10.9>7.8 -- Ancef  2 g IV every 8 hours -- Lasix  40 mg IV every 24 hours for fluid mobilization -- Lower extremity elevation -- Wound RN consult for assessment, wound care orders -- Plan Unna boots following wound assessment by wound RN -- CBC in a.m.  Candidiasis, pannus --Nystatin  topical 3 times daily  HTN Chronic diastolic congestive heart failure,  compensated TTE 01/28/2024 with LVEF 65-70%, grade 1 diastolic dysfunction, IVC dilated. -- Metoprolol  succinate 25 mg p.o. daily -- Lasix  40 mg IV every 24 hours -- Strict I's and O's and daily weights -- BMP daily  CKD stage IIIa Baseline creatinine 1.4-1.5. -- Cr 1.34>1.30; stable  Rheumatoid arthritis -- Leflunomide  20 mg p.o. daily -- Prednisone  5 mg p.o. daily  Anemia of chronic medical disease -- Hemoglobin 8.9, stable  Anxiety/depression -- Zyprexa  5 mg p.o. daily -- Cogentin  0.5 mg p.o. twice daily -- Xanax  0.25 mg p.o. twice daily as needed anxiety -- Lexapro  50 mg p.o. daily  Cognitive impairment, mild --Delirium precautions --Get up during the day --Encourage a familiar face to remain present throughout the day --Keep blinds open and lights on during daylight hours --Minimize the use of opioids/benzodiazepines  Weakness/debility/deconditioning: -- PT/OT evaluation -- TOC consult   DVT prophylaxis: SCDs Start: 02/11/24 2234    Code Status: Full Code Family Communication: No family present at bedside this morning  Disposition Plan:  Level of care: Med-Surg Status is: Inpatient Remains inpatient appropriate because: IV antibiotics    Consultants:  None  Procedures:  None  Antimicrobials:  Ceftriaxone  7/31 -7/31 Cefazolin  7/31>>   Subjective: Patient seen examined bedside, lying in bed.  RN present at bedside.  Reports need to get back to [facility] because I have to move to a new place.  Remains on IV antibiotics.  Discussed with RN regarding wound consult.  Patient with no other specific complaints or concerns at this time.  Denies headache, vision changes, no chest pain, no dizziness, no palpitations, no shortness of breath, no abdominal pain, no fever/chills/night sweats, no  nausea/vomiting/diarrhea.  No acute events overnight per nursing staff.  Objective: Vitals:   02/11/24 1800 02/11/24 2139 02/12/24 0606 02/12/24 0925  BP: 110/66 (!)  157/76 (!) 110/46 (!) 123/51  Pulse: 94 (!) 105 97 (!) 103  Resp: 18 18 18 20   Temp: 98.7 F (37.1 C) 98.7 F (37.1 C) 99.3 F (37.4 C) 98.9 F (37.2 C)  TempSrc:  Oral Oral Oral  SpO2: 97% 98% 92% 93%  Weight:      Height:        Intake/Output Summary (Last 24 hours) at 02/12/2024 1240 Last data filed at 02/12/2024 1010 Gross per 24 hour  Intake 810.12 ml  Output --  Net 810.12 ml   Filed Weights   02/11/24 1650  Weight: 77.1 kg    Examination:  Physical Exam: GEN: NAD, alert and oriented x 3, chronically ill in appearance HEENT: NCAT, PERRL, EOMI, sclera clear, MMM PULM: CTAB w/o wheezes/crackles, normal respiratory effort, on room air CV: RRR w/o M/G/R GI: abd soft, NTND, + BS MSK: + Bilateral lower extremity peripheral edema with surrounding erythema, no fluctuance or purulent discharge, slight TTP bilaterally NEURO: CN II-XII intact, no focal deficits, sensation to light touch intact PSYCH: normal mood/affect Integumentary: Bilateral lower extremity wounds/erythema as depicted below, candidal rash noted to pannus/abdomen's; otherwise no other concerning rashes/lesions/wounds noted on exposed skin surfaces       Data Reviewed: I have personally reviewed following labs and imaging studies  CBC: Recent Labs  Lab 02/11/24 1727 02/12/24 0555  WBC 10.9* 7.8  NEUTROABS 9.1*  --   HGB 9.8* 8.9*  HCT 32.7* 29.2*  MCV 94.8 95.4  PLT 243 204   Basic Metabolic Panel: Recent Labs  Lab 02/11/24 1727 02/12/24 0555  NA 143 141  K 4.4 3.8  CL 106 110  CO2 25 23  GLUCOSE 139* 88  BUN 26* 22  CREATININE 1.34* 1.30*  CALCIUM  7.9* 7.2*  MG 2.4 2.0  PHOS 4.3 3.3   GFR: Estimated Creatinine Clearance: 35.9 mL/min (A) (by C-G formula based on SCr of 1.3 mg/dL (H)). Liver Function Tests: Recent Labs  Lab 02/11/24 1727 02/12/24 0555  AST 29 24  ALT 32 27  ALKPHOS 116 94  BILITOT 0.3 0.4  PROT 6.8 5.4*  ALBUMIN  3.2* 2.6*   No results for input(s):  LIPASE, AMYLASE in the last 168 hours. No results for input(s): AMMONIA in the last 168 hours. Coagulation Profile: No results for input(s): INR, PROTIME in the last 168 hours. Cardiac Enzymes: Recent Labs  Lab 02/11/24 1727  CKTOTAL 211   BNP (last 3 results) No results for input(s): PROBNP in the last 8760 hours. HbA1C: No results for input(s): HGBA1C in the last 72 hours. CBG: Recent Labs  Lab 02/11/24 2201 02/12/24 0000 02/12/24 0719 02/12/24 1116  GLUCAP 106* 170* 99 117*   Lipid Profile: No results for input(s): CHOL, HDL, LDLCALC, TRIG, CHOLHDL, LDLDIRECT in the last 72 hours. Thyroid  Function Tests: No results for input(s): TSH, T4TOTAL, FREET4, T3FREE, THYROIDAB in the last 72 hours. Anemia Panel: Recent Labs    02/11/24 1727 02/11/24 2146 02/12/24 0555  VITAMINB12  --   --  252  FOLATE  --   --  36.2  FERRITIN  --  28  --   TIBC  --  323  --   IRON  --  27*  --   RETICCTPCT 2.3  --   --    Sepsis Labs: No results for input(s): PROCALCITON, LATICACIDVEN  in the last 168 hours.  No results found for this or any previous visit (from the past 240 hours).       Radiology Studies: No results found.      Scheduled Meds:  aspirin   81 mg Oral Daily   benztropine   0.5 mg Oral BID   escitalopram   15 mg Oral Daily   furosemide   40 mg Intravenous Daily   hydroxychloroquine   600 mg Oral Daily   insulin  aspart  0-9 Units Subcutaneous Q4H   leflunomide   20 mg Oral Daily   metoprolol  succinate  25 mg Oral Daily   nystatin    Topical TID   OLANZapine   5 mg Oral Daily   predniSONE   5 mg Oral Daily   sodium chloride  flush  3 mL Intravenous Q12H   Continuous Infusions:  sodium chloride       ceFAZolin  (ANCEF ) IV 2 g (02/12/24 0614)     LOS: 0 days    Time spent: 52 minutes spent on 02/12/2024 caring for this patient face-to-face including chart review, ordering labs/tests, documenting, discussion with nursing staff,  consultants, updating family and interview/physical exam    Camellia PARAS [place], DO [facility] Available via Epic secure chat 7am-7pm After these hours, please refer to coverage provider listed on [email or web address] 02/12/2024, 12:40 PM

## 2024-02-13 DIAGNOSIS — L03119 Cellulitis of unspecified part of limb: Secondary | ICD-10-CM | POA: Diagnosis not present

## 2024-02-13 LAB — CBC
HCT: 31.5 % — ABNORMAL LOW (ref 36.0–46.0)
Hemoglobin: 9.5 g/dL — ABNORMAL LOW (ref 12.0–15.0)
MCH: 28.4 pg (ref 26.0–34.0)
MCHC: 30.2 g/dL (ref 30.0–36.0)
MCV: 94.3 fL (ref 80.0–100.0)
Platelets: 212 K/uL (ref 150–400)
RBC: 3.34 MIL/uL — ABNORMAL LOW (ref 3.87–5.11)
RDW: 14.8 % (ref 11.5–15.5)
WBC: 6.5 K/uL (ref 4.0–10.5)
nRBC: 0 % (ref 0.0–0.2)

## 2024-02-13 LAB — GLUCOSE, CAPILLARY
Glucose-Capillary: 83 mg/dL (ref 70–99)
Glucose-Capillary: 93 mg/dL (ref 70–99)
Glucose-Capillary: 98 mg/dL (ref 70–99)

## 2024-02-13 LAB — BASIC METABOLIC PANEL WITH GFR
Anion gap: 9 (ref 5–15)
BUN: 21 mg/dL (ref 8–23)
CO2: 23 mmol/L (ref 22–32)
Calcium: 7.2 mg/dL — ABNORMAL LOW (ref 8.9–10.3)
Chloride: 107 mmol/L (ref 98–111)
Creatinine, Ser: 1.23 mg/dL — ABNORMAL HIGH (ref 0.44–1.00)
GFR, Estimated: 45 mL/min — ABNORMAL LOW (ref >60)
Glucose, Bld: 87 mg/dL (ref 70–99)
Potassium: 3.5 mmol/L (ref 3.5–5.1)
Sodium: 139 mmol/L (ref 135–145)

## 2024-02-13 LAB — HEMOGLOBIN A1C
Hgb A1c MFr Bld: 6 % — ABNORMAL HIGH (ref 4.8–5.6)
Mean Plasma Glucose: 126 mg/dL

## 2024-02-13 MED ORDER — FUROSEMIDE 10 MG/ML IJ SOLN
20.0000 mg | Freq: Every day | INTRAMUSCULAR | Status: DC
  Administered 2024-02-13 – 2024-02-15 (×3): 20 mg via INTRAVENOUS
  Filled 2024-02-13 (×3): qty 2

## 2024-02-13 NOTE — Progress Notes (Signed)
 PROGRESS NOTE    [name]  FMW:[id number] DOB: 1945/08/10 DOA: 02/11/2024 PCP: Patient, No Pcp Per    Brief Narrative:   [name] is a 78 y.o. female with past medical history significant for HTN, CKD stage IIIa, RA on chronic prednisone , anemia of chronic medical disease, chronic bilateral lower extremity edema with venous stasis, depression/anxiety, mild cognitive impairment who presented to [facility] ED [facility]11[facility] with concerns for progressive lower extremity edema/cellulitis nonresponsive to oral antibiotic therapy outpatient.  Patient reports issues to her legs going on for some time.  Denies fever, no chills.  At baseline wheelchair dependent but able to assist with transfers.  In the ED, temperature 98.5 F, HR 90, RR 18, BP 112/62, SpO2 100% on room air.  WBC 10.9, hemoglobin 9.8, platelet count 243.  Sodium 143, potassium 4.4, chloride 106, CO2 25, glucose 139, BUN 26, creat 1.34.  AST 29, ALT 32, total bilirubin 0.3.  TRH consulted for admission for further evaluation management of bilateral lower extremity cellulitis not responsive to oral antibiotic treatment outpatient.  Assessment & Plan:   Bilateral lower extremity cellulitis with lymphedema/venous stasis Patient presenting with progressive lower extremity edema, erythema despite outpatient oral antibiotic use.  Likely complicated by her chronic lymphedema/venous stasis.  Most recent TTE 01/28/2024 notable for grade 1 diastolic dysfunction, LVEF 65 to 70% with IVC dilated.  Patient is afebrile with elevated WBC count of 10.9. -- WBC 10.9>7.8 -- Ancef  2 g IV every 8 hours -- Lasix  20 mg IV every 24 hours for fluid mobilization -- Cleanse wound with NS, pat dry.  Apply Xeroform to wound bed and cover with Silicone foam dressing. Wrap legs with Kerlix, covered with Ace wrap from the base of the toes to right below the knee. Change daily.   Candidiasis, pannus -- Nystatin  topical 3 times  daily  HTN Chronic diastolic congestive heart failure, compensated TTE 01/28/2024 with LVEF 65-70%, grade 1 diastolic dysfunction, IVC dilated. -- Metoprolol  succinate 25 mg p.o. daily -- Lasix  20 mg IV every 24 hours -- Strict I's and O's and daily weights -- BMP daily  CKD stage IIIa Baseline creatinine 1.4-1.5. -- Cr 1.34>1.30>1.23; stable  Rheumatoid arthritis -- Leflunomide  20 mg p.o. daily -- Prednisone  5 mg p.o. daily  Anemia of chronic medical disease -- Hemoglobin 8.9, stable  Anxiety/depression -- Zyprexa  5 mg p.o. daily -- Cogentin  0.5 mg p.o. twice daily -- Xanax  0.25 mg p.o. twice daily as needed anxiety -- Lexapro  50 mg p.o. daily  Cognitive impairment, mild --Delirium precautions --Get up during the day --Encourage a familiar face to remain present throughout the day --Keep blinds open and lights on during daylight hours --Minimize the use of opioids/benzodiazepines  Weakness/debility/deconditioning: From [facility] -- PT/OT evaluation -- TOC consult   DVT prophylaxis: SCDs Start: 02/11/24 2234    Code Status: Full Code Family Communication: No family present at bedside this morning  Disposition Plan:  Level of care: Med-Surg Status is: Inpatient Remains inpatient appropriate because: IV antibiotics    Consultants:  None  Procedures:  None  Antimicrobials:  Ceftriaxone  7/31 -7/31 Cefazolin  7/31>>   Subjective: Patient seen examined bedside, lying in bed.  No family present.  No specific complaints or concerns this morning.  Discussed with RN, will reduce IV Lasix  due to borderline hypotension.  Seen by wound RN yesterday.  Remains on IV antibiotics.  Denies headache, vision changes, no chest pain, no dizziness, no palpitations, no shortness of  breath, no abdominal pain, no fever/chills/night sweats, no nausea/vomiting/diarrhea.  No acute events overnight per nursing staff.  Objective: Vitals:   02/13/24 0422 02/13/24 0946 02/13/24  1115 02/13/24 1328  BP: (!) 116/48 (!) 102/46 109/69 (!) 106/92  Pulse: 86 89 98 97  Resp: 20 17 (!) 22 18  Temp: 98.4 F (36.9 C)  98.7 F (37.1 C) 99.2 F (37.3 C)  TempSrc: Oral  Oral Oral  SpO2: 96%  92% 93%  Weight:      Height:        Intake/Output Summary (Last 24 hours) at 02/13/2024 1424 Last data filed at 02/12/2024 1507 Gross per 24 hour  Intake 100 ml  Output --  Net 100 ml   Filed Weights   02/11/24 1650  Weight: 77.1 kg    Examination:  Physical Exam: GEN: NAD, alert and oriented x 3, chronically ill in appearance HEENT: NCAT, PERRL, EOMI, sclera clear, MMM PULM: CTAB w/o wheezes/crackles, normal respiratory effort, on room air CV: RRR w/o M/G/R GI: abd soft, NTND, + BS MSK: + Bilateral lower extremity peripheral edema with surrounding erythema, no fluctuance or purulent discharge, slight TTP bilaterally NEURO: CN II-XII intact, no focal deficits, sensation to light touch intact PSYCH: normal mood/affect Integumentary: Bilateral lower extremity wounds/erythema as depicted below, candidal rash noted to pannus/abdomen's; otherwise no other concerning rashes/lesions/wounds noted on exposed skin surfaces       Data Reviewed: I have personally reviewed following labs and imaging studies  CBC: Recent Labs  Lab 02/11/24 1727 02/12/24 0555 02/13/24 0724  WBC 10.9* 7.8 6.5  NEUTROABS 9.1*  --   --   HGB 9.8* 8.9* 9.5*  HCT 32.7* 29.2* 31.5*  MCV 94.8 95.4 94.3  PLT 243 204 212   Basic Metabolic Panel: Recent Labs  Lab 02/11/24 1727 02/12/24 0555 02/13/24 0724  NA 143 141 139  K 4.4 3.8 3.5  CL 106 110 107  CO2 25 23 23   GLUCOSE 139* 88 87  BUN 26* 22 21  CREATININE 1.34* 1.30* 1.23*  CALCIUM  7.9* 7.2* 7.2*  MG 2.4 2.0  --   PHOS 4.3 3.3  --    GFR: Estimated Creatinine Clearance: 37.9 mL/min (A) (by C-G formula based on SCr of 1.23 mg/dL (H)). Liver Function Tests: Recent Labs  Lab 02/11/24 1727 02/12/24 0555  AST 29 24  ALT 32 27   ALKPHOS 116 94  BILITOT 0.3 0.4  PROT 6.8 5.4*  ALBUMIN  3.2* 2.6*   No results for input(s): LIPASE, AMYLASE in the last 168 hours. No results for input(s): AMMONIA in the last 168 hours. Coagulation Profile: No results for input(s): INR, PROTIME in the last 168 hours. Cardiac Enzymes: Recent Labs  Lab 02/11/24 1727  CKTOTAL 211   BNP (last 3 results) No results for input(s): PROBNP in the last 8760 hours. HbA1C: Recent Labs    02/11/24 1727  HGBA1C 6.0*   CBG: Recent Labs  Lab 02/12/24 1606 02/12/24 2029 02/13/24 0021 02/13/24 0420 02/13/24 0811  GLUCAP 110* 133* 83 98 93   Lipid Profile: No results for input(s): CHOL, HDL, LDLCALC, TRIG, CHOLHDL, LDLDIRECT in the last 72 hours. Thyroid  Function Tests: No results for input(s): TSH, T4TOTAL, FREET4, T3FREE, THYROIDAB in the last 72 hours. Anemia Panel: Recent Labs    02/11/24 1727 02/11/24 2146 02/12/24 0555  VITAMINB12  --   --  252  FOLATE  --   --  36.2  FERRITIN  --  28  --   TIBC  --  323  --   IRON  --  27*  --   RETICCTPCT 2.3  --   --    Sepsis Labs: No results for input(s): PROCALCITON, LATICACIDVEN in the last 168 hours.  Recent Results (from the past 240 hours)  MRSA Next Gen by PCR, Nasal     Status: Abnormal   Collection Time: 02/12/24  7:30 AM   Specimen: Nasal Mucosa; Nasal Swab  Result Value Ref Range Status   MRSA by PCR Next Gen DETECTED (A) NOT DETECTED Final    Comment: RESULT CALLED TO, READ BACK BY AND VERIFIED WITH: R. PEAKS ON 02/12/2024 AT 1251 BY SL (NOTE) The GeneXpert MRSA Assay (FDA approved for NASAL specimens only), is one component of a comprehensive MRSA colonization surveillance program. It is not intended to diagnose MRSA infection nor to guide or monitor treatment for MRSA infections. Test performance is not FDA approved in patients less than 78 years old. Performed at [facility], [address], [place], [place] [address]          Radiology Studies: No results found.      Scheduled Meds:  aspirin   81 mg Oral Daily   benztropine   0.5 mg Oral BID   escitalopram   15 mg Oral Daily   ferrous sulfate   325 mg Oral Q M,W,F   furosemide   20 mg Intravenous Daily   hydroxychloroquine   600 mg Oral Daily   leflunomide   20 mg Oral Daily   loratadine   10 mg Oral Daily   metoprolol  succinate  25 mg Oral Daily   nystatin    Topical TID   OLANZapine   5 mg Oral Daily   predniSONE   5 mg Oral Daily   sodium chloride  flush  3 mL Intravenous Q12H   Continuous Infusions:   ceFAZolin  (ANCEF ) IV 2 g (02/13/24 1336)     LOS: 1 day    Time spent: 48 minutes spent on 02/13/2024 caring for this patient face-to-face including chart review, ordering labs/tests, documenting, discussion with nursing staff, consultants, updating family and interview/physical exam    Camellia PARAS [place], DO [facility] Available via Epic secure chat 7am-7pm After these hours, please refer to coverage provider listed on [email or web address] 02/13/2024, 2:24 PM

## 2024-02-13 NOTE — Evaluation (Addendum)
 Occupational Therapy Evaluation Patient Details Name: [name] MRN: [id number] DOB: 01-11-1946 Today's Date: 02/13/2024   History of Present Illness   78 yo female presents to therapy following hospital admission on 02/11/2024 due to B LE edema, calor and rubor with dx of B LE cellulitis. Pt PMH includes but is limited to: HTN, CKD III, RA, anemia, B LE edema with venous stasis, depression, anxiety, bladder perforation s/p exp lap, resection of papillary bladder tumor and mild cognitive impairment.     Clinical Impressions PTA, pt was living at ALF, she reports she was requiring assistance with ADL/IADL and was completing transfers from bed<>w/c, w/c<>commode. Pt currently requires modA for squat pivot transfer from EOB<>BSC. Pt voided on BSC. Pt appeared to have increased anxiety with mobility. Due to decline in current level of function, pt would benefit from acute OT to address established goals to facilitate safe D/C to venue listed below. Recommend d/c to ALF with HHOT follow-up. Will continue to follow acutely.      If plan is discharge home, recommend the following:   A little help with walking and/or transfers;A lot of help with bathing/dressing/bathroom;Assistance with cooking/housework;Help with stairs or ramp for entrance     Functional Status Assessment   Patient has had a recent decline in their functional status and demonstrates the ability to make significant improvements in function in a reasonable and predictable amount of time.     Equipment Recommendations         Recommendations for Other Services         Precautions/Restrictions   Precautions Precautions: Fall Restrictions Weight Bearing Restrictions Per Provider Order: No     Mobility Bed Mobility Overal bed mobility: Needs Assistance Bed Mobility: Supine to Sit     Supine to sit: Supervision, HOB elevated, Used rails     General bed mobility comments: min cues     Transfers Overall transfer level: Needs assistance Equipment used: None (pt declined RW) Transfers: Bed to chair/wheelchair/BSC     Squat pivot transfers: Mod assist       General transfer comment: modA for face to face squat pivot transfer from EOB<>BSC. pt appeared anxious with mobility, rushing through movements despite education from therapist to take time and move slowly.      Balance Overall balance assessment: Needs assistance Sitting-balance support: Feet supported Sitting balance-Leahy Scale: Fair     Standing balance support: Single extremity supported, During functional activity Standing balance-Leahy Scale: Poor Standing balance comment: unable to progress into standing with just single UE supported.                           ADL either performed or assessed with clinical judgement   ADL Overall ADL's : Needs assistance/impaired Eating/Feeding: Set up;Sitting   Grooming: Set up;Sitting Grooming Details (indicate cue type and reason): completed oral care and combed hair while sitting EOB Upper Body Bathing: Contact guard assist;Sitting   Lower Body Bathing: Minimal assistance;Sitting/lateral leans   Upper Body Dressing : Contact guard assist;Sitting   Lower Body Dressing: Moderate assistance;Sit to/from stand   Toilet Transfer: Moderate assistance;Squat-pivot;BSC/3in1 Toilet Transfer Details (indicate cue type and reason): pt voided on bsc Toileting- Clothing Manipulation and Hygiene: Moderate assistance Toileting - Clothing Manipulation Details (indicate cue type and reason): assist for pericare   Tub/Shower Transfer Details (indicate cue type and reason): deferred for safety Functional mobility during ADLs: Moderate assistance (stand pivot transfer)  Vision         Perception         Praxis         Pertinent Vitals/Pain Pain Assessment Pain Assessment: Faces Faces Pain Scale: Hurts even more Breathing: normal Negative  Vocalization: none Facial Expression: sad, frightened, frown Body Language: tense, distressed pacing, fidgeting Consolability: no need to console PAINAD Score: 2 Pain Location: B distal LE Pain Descriptors / Indicators: Grimacing, Guarding Pain Intervention(s): Limited activity within patient's tolerance, Monitored during session     Extremity/Trunk Assessment Upper Extremity Assessment Upper Extremity Assessment: Generalized weakness   Lower Extremity Assessment Lower Extremity Assessment: Generalized weakness   Cervical / Trunk Assessment Cervical / Trunk Assessment: Kyphotic   Communication Communication Communication: No apparent difficulties   Cognition Arousal: Alert Behavior During Therapy: WFL for tasks assessed/performed Cognition: History of cognitive impairments             OT - Cognition Comments: unsure pt's baseline, but hx of mild cognitive impairments per chart                 Following commands: Intact       Cueing  General Comments   Cueing Techniques: Verbal cues;Tactile cues;Visual cues  BLE ace bandage dressing in place   Exercises     Shoulder Instructions      Home Living Family/patient expects to be discharged to:: Assisted living                                 Additional Comments: [facility] [place]      Prior Functioning/Environment Prior Level of Function : Needs assist       Physical Assist : ADLs (physical);Mobility (physical) Mobility (physical): Transfers;Gait ADLs (physical): IADLs;Toileting;Dressing;Bathing Mobility Comments: pt reports she is able to transfer from bed <> wc and wc<> commode, pt is able to self propel wc with B LE and wc is primary means of facilitiy mobiltiy, pt reports able to amb short distances with RW when working with therapy at ALF. ADLs Comments: pt requires A for bathing tasks, dressing, meals, medication management and IADLs.    OT Problem List: Decreased  activity tolerance;Impaired balance (sitting and/or standing);Decreased cognition;Decreased safety awareness   OT Treatment/Interventions: Self-care/ADL training;Energy conservation;DME and/or AE instruction;Patient/family education;Balance training      OT Goals(Current goals can be found in the care plan section)   Acute Rehab OT Goals Patient Stated Goal: pt did not state a specific goal OT Goal Formulation: With patient Time For Goal Achievement: 02/27/24 Potential to Achieve Goals: Fair ADL Goals Pt Will Perform Grooming: with modified independence;sitting Pt Will Perform Lower Body Dressing: sit to/from stand;with contact guard assist Pt Will Transfer to Toilet: with contact guard assist;stand pivot transfer   OT Frequency:  Min 2X/week    Co-evaluation              AM-PAC OT 6 Clicks Daily Activity     Outcome Measure Help from another person eating meals?: A Little Help from another person taking care of personal grooming?: A Little Help from another person toileting, which includes using toliet, bedpan, or urinal?: A Lot Help from another person bathing (including washing, rinsing, drying)?: A Lot Help from another person to put on and taking off regular upper body clothing?: A Little Help from another person to put on and taking off regular lower body clothing?: A Lot 6 Click Score: 15  End of Session Equipment Utilized During Treatment: Other (comment) [facility]) Nurse Communication: Mobility status  Activity Tolerance: Patient tolerated treatment well Patient left: in bed;with call bell/phone within reach;with bed alarm set  OT Visit Diagnosis: Other abnormalities of gait and mobility (R26.89);Muscle weakness (generalized) (M62.81);Other symptoms and signs involving cognitive function                Time: 1315-1326 OT Time Calculation (min): 11 min Charges:  OT General Charges $OT Visit: 1 Visit OT Evaluation $OT Eval Moderate Complexity: 1 Mod  Aubre [place]  OTR/L Acute Rehabilitation Services Office: [phone]   [name] 02/13/2024, 3:33 PM

## 2024-02-14 DIAGNOSIS — L03119 Cellulitis of unspecified part of limb: Secondary | ICD-10-CM | POA: Diagnosis not present

## 2024-02-14 NOTE — Progress Notes (Signed)
   02/14/24 1047  TOC Brief Assessment  Insurance and Status Reviewed  Patient has primary care physician Yes  Home environment has been reviewed [facility]  Prior level of function: mod independent  Prior/Current Home Services No current home services  Social Drivers of Health Review SDOH reviewed no interventions necessary  Readmission risk has been reviewed Yes  Transition of care needs no transition of care needs at this time   [facility] setup w/ [facility]

## 2024-02-14 NOTE — Progress Notes (Signed)
 PROGRESS NOTE    [name]  FMW:[id number] DOB: January 09, 1946 DOA: 02/11/2024 PCP: Patient, No Pcp Per    Brief Narrative:   [name] is a 78 y.o. female with past medical history significant for HTN, CKD stage IIIa, RA on chronic prednisone , anemia of chronic medical disease, chronic bilateral lower extremity edema with venous stasis, depression/anxiety, mild cognitive impairment who presented to [facility] ED on 02/11/2024 from [facility] with concerns for progressive lower extremity edema/cellulitis nonresponsive to oral antibiotic therapy outpatient.  Patient reports issues to her legs going on for some time.  Denies fever, no chills.  At baseline wheelchair dependent but able to assist with transfers.  In the ED, temperature 98.5 F, HR 90, RR 18, BP 112/62, SpO2 100% on room air.  WBC 10.9, hemoglobin 9.8, platelet count 243.  Sodium 143, potassium 4.4, chloride 106, CO2 25, glucose 139, BUN 26, creat 1.34.  AST 29, ALT 32, total bilirubin 0.3.  TRH consulted for admission for further evaluation management of bilateral lower extremity cellulitis not responsive to oral antibiotic treatment outpatient.  Assessment & Plan:   Bilateral lower extremity cellulitis with lymphedema/venous stasis Patient presenting with progressive lower extremity edema, erythema despite outpatient oral antibiotic use.  Likely complicated by her chronic lymphedema/venous stasis.  Most recent TTE 01/28/2024 notable for grade 1 diastolic dysfunction, LVEF 65 to 70% with IVC dilated.  Patient is afebrile with elevated WBC count of 10.9. -- WBC 10.9>7.8 -- Ancef  2 g IV every 8 hours -- Lasix  20 mg IV every 24 hours for fluid mobilization -- Cleanse wound with NS, pat dry.  Apply Xeroform to wound bed and cover with Silicone foam dressing. Wrap legs with Kerlix, covered with Ace wrap from the base of the toes to right below the knee. Change daily.   Candidiasis, pannus -- Nystatin  topical 3 times  daily  HTN Chronic diastolic congestive heart failure, compensated TTE 01/28/2024 with LVEF 65-70%, grade 1 diastolic dysfunction, IVC dilated. -- Metoprolol  succinate 25 mg p.o. daily -- Lasix  20 mg IV every 24 hours -- Strict I's and O's and daily weights -- BMP daily  CKD stage IIIa Baseline creatinine 1.4-1.5. -- Cr 1.34>1.30>1.23; stable  Rheumatoid arthritis -- Leflunomide  20 mg p.o. daily -- Prednisone  5 mg p.o. daily  Anemia of chronic medical disease -- Hemoglobin 8.9, stable  Anxiety/depression -- Zyprexa  5 mg p.o. daily -- Cogentin  0.5 mg p.o. twice daily -- Xanax  0.25 mg p.o. twice daily as needed anxiety -- Lexapro  50 mg p.o. daily  Cognitive impairment, mild --Delirium precautions --Get up during the day --Encourage a familiar face to remain present throughout the day --Keep blinds open and lights on during daylight hours --Minimize the use of opioids/benzodiazepines  Weakness/debility/deconditioning: From [facility] -- PT/OT  recommend home health on discharge -- TOC consult   DVT prophylaxis: SCDs Start: 02/11/24 2234    Code Status: Full Code Family Communication: No family present at bedside this morning  Disposition Plan:  Level of care: Med-Surg Status is: Inpatient Remains inpatient appropriate because: IV antibiotics, anticipate discharge home likely tomorrow with home health    Consultants:  None  Procedures:  None  Antimicrobials:  Ceftriaxone  7/31 -7/31 Cefazolin  7/31>>   Subjective: Patient seen examined bedside, lying in bed.  No family present.  No specific complaints or concerns this morning.  Eating breakfast. Remains on IV antibiotics and Lasix .  Denies headache, vision changes, no chest pain, no dizziness, no palpitations, no shortness of breath,  no abdominal pain, no fever/chills/night sweats, no nausea/vomiting/diarrhea.  No acute events overnight per nursing staff.  Objective: Vitals:   02/13/24 1328 02/13/24  2026 02/14/24 0524 02/14/24 0947  BP: (!) 106/92 (!) 108/57 (!) 151/73 102/69  Pulse: 97 88 100 98  Resp: 18 18 18    Temp: 99.2 F (37.3 C) 98.5 F (36.9 C) 98.8 F (37.1 C)   TempSrc: Oral Oral Oral   SpO2: 93% 95% 91%   Weight:      Height:        Intake/Output Summary (Last 24 hours) at 02/14/2024 1156 Last data filed at 02/14/2024 1049 Gross per 24 hour  Intake --  Output 1600 ml  Net -1600 ml   Filed Weights   02/11/24 1650  Weight: 77.1 kg    Examination:  Physical Exam: GEN: NAD, alert and oriented x 3, chronically ill in appearance HEENT: NCAT, PERRL, EOMI, sclera clear, MMM PULM: CTAB w/o wheezes/crackles, normal respiratory effort, on room air CV: RRR w/o M/G/R GI: abd soft, NTND, + BS MSK: + Bilateral lower extremity peripheral edema with surrounding erythema, no fluctuance or purulent discharge, slight TTP bilaterally NEURO: CN II-XII intact, no focal deficits, sensation to light touch intact PSYCH: normal mood/affect Integumentary: Bilateral lower extremity wounds/erythema as depicted below, candidal rash noted to pannus/abdomen's; otherwise no other concerning rashes/lesions/wounds noted on exposed skin surfaces       Data Reviewed: I have personally reviewed following labs and imaging studies  CBC: Recent Labs  Lab 02/11/24 1727 02/12/24 0555 02/13/24 0724  WBC 10.9* 7.8 6.5  NEUTROABS 9.1*  --   --   HGB 9.8* 8.9* 9.5*  HCT 32.7* 29.2* 31.5*  MCV 94.8 95.4 94.3  PLT 243 204 212   Basic Metabolic Panel: Recent Labs  Lab 02/11/24 1727 02/12/24 0555 02/13/24 0724  NA 143 141 139  K 4.4 3.8 3.5  CL 106 110 107  CO2 25 23 23   GLUCOSE 139* 88 87  BUN 26* 22 21  CREATININE 1.34* 1.30* 1.23*  CALCIUM  7.9* 7.2* 7.2*  MG 2.4 2.0  --   PHOS 4.3 3.3  --    GFR: Estimated Creatinine Clearance: 37.9 mL/min (A) (by C-G formula based on SCr of 1.23 mg/dL (H)). Liver Function Tests: Recent Labs  Lab 02/11/24 1727 02/12/24 0555  AST 29 24   ALT 32 27  ALKPHOS 116 94  BILITOT 0.3 0.4  PROT 6.8 5.4*  ALBUMIN  3.2* 2.6*   No results for input(s): LIPASE, AMYLASE in the last 168 hours. No results for input(s): AMMONIA in the last 168 hours. Coagulation Profile: No results for input(s): INR, PROTIME in the last 168 hours. Cardiac Enzymes: Recent Labs  Lab 02/11/24 1727  CKTOTAL 211   BNP (last 3 results) No results for input(s): PROBNP in the last 8760 hours. HbA1C: Recent Labs    02/11/24 1727  HGBA1C 6.0*   CBG: Recent Labs  Lab 02/12/24 1606 02/12/24 2029 02/13/24 0021 02/13/24 0420 02/13/24 0811  GLUCAP 110* 133* 83 98 93   Lipid Profile: No results for input(s): CHOL, HDL, LDLCALC, TRIG, CHOLHDL, LDLDIRECT in the last 72 hours. Thyroid  Function Tests: No results for input(s): TSH, T4TOTAL, FREET4, T3FREE, THYROIDAB in the last 72 hours. Anemia Panel: Recent Labs    02/11/24 1727 02/11/24 2146 02/12/24 0555  VITAMINB12  --   --  252  FOLATE  --   --  36.2  FERRITIN  --  28  --   TIBC  --  323  --   IRON  --  27*  --   RETICCTPCT 2.3  --   --    Sepsis Labs: No results for input(s): PROCALCITON, LATICACIDVEN in the last 168 hours.  Recent Results (from the past 240 hours)  MRSA Next Gen by PCR, Nasal     Status: Abnormal   Collection Time: 02/12/24  7:30 AM   Specimen: Nasal Mucosa; Nasal Swab  Result Value Ref Range Status   MRSA by PCR Next Gen DETECTED (A) NOT DETECTED Final    Comment: RESULT CALLED TO, READ BACK BY AND VERIFIED WITH: R. PEAKS ON 02/12/2024 AT 1251 BY SL (NOTE) The GeneXpert MRSA Assay (FDA approved for NASAL specimens only), is one component of a comprehensive MRSA colonization surveillance program. It is not intended to diagnose MRSA infection nor to guide or monitor treatment for MRSA infections. Test performance is not FDA approved in patients less than 4 years old. Performed at [facility], [address], [place], [place] [address]          Radiology Studies: No results found.      Scheduled Meds:  aspirin   81 mg Oral Daily   benztropine   0.5 mg Oral BID   escitalopram   15 mg Oral Daily   ferrous sulfate   325 mg Oral Q M,W,F   furosemide   20 mg Intravenous Daily   hydroxychloroquine   600 mg Oral Daily   leflunomide   20 mg Oral Daily   loratadine   10 mg Oral Daily   metoprolol  succinate  25 mg Oral Daily   nystatin    Topical TID   OLANZapine   5 mg Oral Daily   predniSONE   5 mg Oral Daily   sodium chloride  flush  3 mL Intravenous Q12H   Continuous Infusions:   ceFAZolin  (ANCEF ) IV 2 g (02/14/24 0725)     LOS: 2 days    Time spent: 48 minutes spent on 02/14/2024 caring for this patient face-to-face including chart review, ordering labs/tests, documenting, discussion with nursing staff, consultants, updating family and interview/physical exam    Camellia PARAS [place], DO [facility] Available via Epic secure chat 7am-7pm After these hours, please refer to coverage provider listed on [email or web address] 02/14/2024, 11:56 AM

## 2024-02-14 NOTE — Plan of Care (Signed)
  Problem: Coping: Goal: Level of anxiety will decrease Outcome: Progressing   Problem: Elimination: Goal: Will not experience complications related to bowel motility Outcome: Progressing   Problem: Pain Managment: Goal: General experience of comfort will improve and/or be controlled Outcome: Progressing   Problem: Safety: Goal: Ability to remain free from injury will improve Outcome: Progressing

## 2024-02-15 DIAGNOSIS — L03119 Cellulitis of unspecified part of limb: Secondary | ICD-10-CM | POA: Diagnosis not present

## 2024-02-15 LAB — CBC
HCT: 33.7 % — ABNORMAL LOW (ref 36.0–46.0)
Hemoglobin: 10.4 g/dL — ABNORMAL LOW (ref 12.0–15.0)
MCH: 29.2 pg (ref 26.0–34.0)
MCHC: 30.9 g/dL (ref 30.0–36.0)
MCV: 94.7 fL (ref 80.0–100.0)
Platelets: 221 K/uL (ref 150–400)
RBC: 3.56 MIL/uL — ABNORMAL LOW (ref 3.87–5.11)
RDW: 14.6 % (ref 11.5–15.5)
WBC: 8.1 K/uL (ref 4.0–10.5)
nRBC: 0 % (ref 0.0–0.2)

## 2024-02-15 LAB — BASIC METABOLIC PANEL WITH GFR
Anion gap: 13 (ref 5–15)
BUN: 20 mg/dL (ref 8–23)
CO2: 25 mmol/L (ref 22–32)
Calcium: 8 mg/dL — ABNORMAL LOW (ref 8.9–10.3)
Chloride: 101 mmol/L (ref 98–111)
Creatinine, Ser: 1.12 mg/dL — ABNORMAL HIGH (ref 0.44–1.00)
GFR, Estimated: 50 mL/min — ABNORMAL LOW (ref >60)
Glucose, Bld: 89 mg/dL (ref 70–99)
Potassium: 3.7 mmol/L (ref 3.5–5.1)
Sodium: 139 mmol/L (ref 135–145)

## 2024-02-15 LAB — MAGNESIUM: Magnesium: 2.2 mg/dL (ref 1.7–2.4)

## 2024-02-15 MED ORDER — CEFADROXIL 500 MG PO CAPS
1000.0000 mg | ORAL_CAPSULE | Freq: Two times a day (BID) | ORAL | 0 refills | Status: AC
Start: 2024-02-15 — End: 2024-02-25

## 2024-02-15 MED ORDER — ALPRAZOLAM 0.25 MG PO TABS: 0.2500 mg | ORAL_TABLET | Freq: Two times a day (BID) | ORAL | 0 refills | Status: AC | PRN

## 2024-02-15 MED ORDER — LASIX 20 MG PO TABS: 40.0000 mg | ORAL_TABLET | Freq: Every day | ORAL | 0 refills | Status: AC

## 2024-02-15 NOTE — Progress Notes (Signed)
 Patient Alert and oriented to be transported by [facility] to [facility]. Report called and given to Hadassah Read of Nursing in the facility. No further concerns.

## 2024-02-15 NOTE — TOC Transition Note (Addendum)
 Transition of Care [facility]) - Discharge Note   Patient Details  Name: [name] MRN: [id number] Date of Birth: 1945/08/13  [facility] CM/SW Contact:  Toy LITTIE Agar, RN Phone Number:[phone]  02/15/2024, 1:37 PM   Clinical Narrative:    CM celled [facility] to confirm patient is able to return. CM spoke with Hadassah who confirms patient can return. Maria requested that d/c summary be placed in d/c packet.   Nursing please call [phone] to give report.   1456 FL2 has been updated and copy added to d/c packet. Home health previously set up with [facility]. CM attempted to contact sister Nena Ellen with no answer and voicemail has not been set up.  Transportation has been arranged per [facility]. D/c packet is at nurses station in chart.          Patient Goals and CMS Choice            Discharge Placement                       Discharge Plan and Services Additional resources added to the After Visit Summary for                                       Social Drivers of Health (SDOH) Interventions SDOH Screenings   Food Insecurity: No Food Insecurity (02/11/2024)  Housing: Low Risk  (02/11/2024)  Transportation Needs: No Transportation Needs (02/11/2024)  Utilities: Not At Risk (02/11/2024)  Alcohol Screen: Low Risk  (08/10/2018)  Social Connections: Socially Isolated (02/11/2024)  Tobacco Use: Low Risk  (02/11/2024)     Readmission Risk Interventions    02/14/2024   10:45 AM  Readmission Risk Prevention Plan  Transportation Screening Complete  PCP or Specialist Appt within 3-5 Days Complete  HRI or Home Care Consult Complete  Social Work Consult for Recovery Care Planning/Counseling Complete  Palliative Care Screening Not Applicable  Medication Review Oceanographer) Complete

## 2024-02-15 NOTE — Discharge Summary (Signed)
 Physician Discharge Summary  [name] FMW:[id number] DOB: 12-14-1945 DOA: 02/11/2024  PCP: Patient, No Pcp Per  Admit date: 02/11/2024 Discharge date: 02/15/2024  Admitted From: [facility] Disposition:  [facility] with home health  Recommendations for Outpatient Follow-up:  Follow up with PCP in 1-2 weeks Continue antibiotics with cefadroxil  to complete total course of 14 days for bilateral lower extremity cellulitis Increase Lasix  to 40 mg p.o. daily Continue to ensure wound care, slight compression wrapping of her bilateral lower extremities to mobilize fluid as likely contributing factor to her infection; lower extremity elevation  Home Health: PT/OT/RN/home health aide/social work Equipment/Devices: None, has wheelchair at facility  Discharge Condition: Stable CODE STATUS: Full code Diet recommendation: Heart healthy diet  History of present illness:  [name] is a 78 y.o. female with past medical history significant for HTN, CKD stage IIIa, RA on chronic prednisone , anemia of chronic medical disease, chronic bilateral lower extremity edema with venous stasis, depression/anxiety, mild cognitive impairment who presented to [facility] ED on 02[facility]11/[facility]24 [facility] with concerns for progressive lower extremity edema/cellulitis nonresponsive to oral antibiotic therapy outpatient.  Patient reports issues to her legs going on for some time.  Denies fever, no chills.  At baseline wheelchair dependent but able to assist with transfers.   In the ED, temperature 98.5 F, HR 90, RR 18, BP 112/62, SpO2 100% on room air.  WBC 10.9, hemoglobin 9.8, platelet count 243.  Sodium 143, potassium 4.4, chloride 106, CO2 25, glucose 139, BUN 26, creat 1.34.  AST 29, ALT 32, total bilirubin 0.3.  TRH consulted for admission for further evaluation management of bilateral lower extremity cellulitis not responsive to oral antibiotic treatment outpatient.  Hospital  course:  Bilateral lower extremity cellulitis with lymphedema/venous stasis Patient presenting with progressive lower extremity edema, erythema despite outpatient oral antibiotic use.  Likely complicated by her chronic lymphedema/venous stasis.  Most recent TTE 01/28/2024 notable for grade 1 diastolic dysfunction, LVEF 65 to 70% with IVC dilated.  Patient is afebrile with elevated WBC count of 10.9.  Patient was started on IV Ancef  and IV Lasix  for fluid mobilization.  Seen by wound RN with recommendation of cleanse wound with NS, pat dry.  Apply Xeroform to wound bed and cover with Silicone foam dressing. Wrap legs with Kerlix, covered with Ace wrap from the base of the toes to right below the knee, change daily.  Will continue cefadroxil  1 g p.o. twice daily to complete 14-day total course of antibiotics on discharge.  Continue leg elevation.  Lasix  for fluid mobilization.   Candidiasis, pannus Continue continue nystatin    HTN Chronic diastolic congestive heart failure, compensated TTE 01/28/2024 with LVEF 65-70%, grade 1 diastolic dysfunction, IVC dilated.  Continue metoprolol  succinate 25 mg p.o. daily, furosemide  40 mg p.o. daily.  Recommend intermittent monitoring of weights.   CKD stage IIIa Baseline creatinine 1.4-1.5.  Creatinine stable, 1.23 at time of discharge.   Rheumatoid arthritis Continue leflunomide  20 mg p.o. daily, Prednisone  5 mg p.o. daily   Anemia of chronic medical disease Hemoglobin 8.9, stable   Anxiety/depression Continue Zyprexa  5 mg p.o. daily, Cogentin  0.5 mg p.o. twice daily, Xanax  0.25 mg p.o. twice daily as needed anxiety, Lexapro  50 mg p.o. daily   Cognitive impairment, mild Supportive care, delirium precautions   Weakness/debility/deconditioning: From [facility].  Seen by PT and OT with recommendation of home health on discharge.     Discharge Diagnoses:  Principal Problem:   Cellulitis  Active Problems:   Type 2 diabetes mellitus without  complication (HCC)   Rheumatoid arthritis (HCC)   Essential hypertension   Hyperlipidemia   Cognitive impairment   Cervical myelopathy (HCC)   CKD (chronic kidney disease), stage III (HCC)   Venous stasis of both lower extremities   Candida infection   Cellulitis of both lower extremities    Discharge Instructions  Discharge Instructions     Call MD for:  difficulty breathing, headache or visual disturbances   Complete by: As directed    Call MD for:  extreme fatigue   Complete by: As directed    Call MD for:  persistant dizziness or light-headedness   Complete by: As directed    Call MD for:  persistant nausea and vomiting   Complete by: As directed    Call MD for:  severe uncontrolled pain   Complete by: As directed    Call MD for:  temperature >100.4   Complete by: As directed    Diet - low sodium heart healthy   Complete by: As directed    Discharge wound care:   Complete by: As directed    Cleanse wound with NS, pat dry.  Apply Xeroform to wound bed and cover with Silicone foam dressing. Wrap legs with Kerlix, covered with Ace wrap from the base of the toes to right below the knee. Change daily.   Increase activity slowly   Complete by: As directed       Allergies as of 02/15/2024       Reactions   Iodinated Contrast Media Anaphylaxis, Hives, Other (See Comments)   Only the retina dye- Allergic, per [facility]   Abilify [aripiprazole] Other (See Comments)   Allergic, per [facility]   Ultram [tramadol Hcl] Other (See Comments)   Allergic, per [facility]   Zithromax [azithromycin] Anxiety, Other (See Comments)   Heightens anxiety and Allergic, per [facility]        Medication List     TAKE these medications    acetaminophen  500 MG tablet Commonly known as: TYLENOL  Take 1,000 mg by mouth every 6 (six) hours as needed for fever (or pain).   ALPRAZolam  0.25 MG tablet Commonly known as: XANAX  Take 1 tablet (0.25 mg total) by mouth 2 (two) times daily as needed for  anxiety.   ascorbic acid 500 MG tablet Commonly known as: VITAMIN C Take 500 mg by mouth every Monday, Wednesday, and Friday.   aspirin  81 MG chewable tablet Chew 81 mg by mouth daily.   benztropine  0.5 MG tablet Commonly known as: COGENTIN  Take 0.5 mg by mouth in the morning and at bedtime.   Calmoseptine 0.44-20.6 % Oint Generic drug: Menthol -Zinc Oxide Place 1 Application into the perineum 3 (three) times daily as needed (reason not listed).   cefadroxil  500 MG capsule Commonly known as: DURICEF Take 2 capsules (1,000 mg total) by mouth 2 (two) times daily for 10 days.   CENTRUM SILVER PO Take 1 tablet by mouth daily with breakfast.   Dextromethorphan-guaiFENesin 5-100 MG/5ML Liqd Take 20 mLs by mouth every 4 (four) hours as needed (cough/congestion).   escitalopram  10 MG tablet Commonly known as: LEXAPRO  Take 15 mg by mouth daily.   ferrous sulfate  325 (65 FE) MG EC tablet Take 325 mg by mouth every Monday, Wednesday, and Friday.   hydroxychloroquine  200 MG tablet Commonly known as: PLAQUENIL  Take 3 tablets (600 mg total) by mouth daily.   hydrOXYzine  50 MG tablet Commonly known as: ATARAX  Take 50 mg  by mouth in the morning and at bedtime.   ipratropium 0.03 % nasal spray Commonly known as: ATROVENT Place 2 sprays into both nostrils 2 (two) times daily as needed for rhinitis.   Lasix  20 MG tablet Generic drug: furosemide  Take 2 tablets (40 mg total) by mouth daily. What changed: when to take this   leflunomide  20 MG tablet Commonly known as: ARAVA  Take 1 tablet (20 mg total) by mouth daily.   loperamide 2 MG tablet Commonly known as: IMODIUM A-D Take 2 mg by mouth as needed for diarrhea or loose stools (after each loose stool- MAX OF 3 TABLETS/24 HOURS).   loratadine  10 MG tablet Commonly known as: CLARITIN  Take 1 tablet (10 mg total) by mouth daily.   melatonin 5 MG Tabs Take 5 mg by mouth at bedtime.   metoprolol  succinate 25 MG 24 hr  tablet Commonly known as: TOPROL -XL Take 25 mg by mouth daily.   NATURAL BALANCE TEARS OP Place 1 drop into the right eye as needed (irritation/itching).   NON FORMULARY Place 1 application  into the right eye See admin instructions. Cool compress- 1 application to right eye as needed (reason not listed)   nystatin  powder Apply 1 Application topically daily as needed (after bathing for brief change with excessive soiling).   nystatin  cream Commonly known as: MYCOSTATIN  Apply 1 Application topically 2 (two) times daily as needed (for redness- under abdominal folds and/or under the breasts).   OLANZapine  5 MG tablet Commonly known as: ZYPREXA  Take 5 mg by mouth daily.   polyethylene glycol 17 g packet Commonly known as: MIRALAX  / GLYCOLAX  Take 17 g by mouth 2 (two) times daily. What changed: when to take this   predniSONE  5 MG tablet Commonly known as: DELTASONE  Take 5 mg by mouth daily.   REGULOID PO Take 0.4 g by mouth at bedtime.   Sore Throat 15-3.6 MG Lozg Generic drug: Benzocaine-Menthol  Use as directed 1 lozenge in the mouth or throat every 2 (two) hours as needed (sore throat).   Vitamin D3 50 MCG (2000 UT) Tabs Take 2,000 Units by mouth daily.               Discharge Care Instructions  (From admission, onward)           Start     Ordered   02/15/24 0000  Discharge wound care:       Comments: Cleanse wound with NS, pat dry.  Apply Xeroform to wound bed and cover with Silicone foam dressing. Wrap legs with Kerlix, covered with Ace wrap from the base of the toes to right below the knee. Change daily.   02/15/24 0930            Follow-up Information     [facility] Follow up.   Contact information: [address] [place] [address] [phone]                Allergies  Allergen Reactions   Iodinated Contrast Media Anaphylaxis, Hives and Other (See Comments)    Only the retina dye- Allergic, per  [facility]   Abilify [Aripiprazole] Other (See Comments)    Allergic, per [facility]   Ultram [Tramadol Hcl] Other (See Comments)    Allergic, per [facility]   Zithromax [Azithromycin] Anxiety and Other (See Comments)    Heightens anxiety and Allergic, per [facility]    Consultations: None   Procedures/Studies: ECHOCARDIOGRAM COMPLETE Result Date: 01/28/2024    ECHOCARDIOGRAM REPORT  Patient Name:   DESTONY PREVOST [facility] Date of Exam: 01/28/2024 Medical Rec #:  [id number]          Height:       64.0 in Accession #:    [id number]         Weight:       169.3 lb Date of Birth:  [birth year]          BSA:          1.823 m Patient Age:    78 years           BP:           127/60 mmHg Patient Gender: F                  HR:           86 bpm. Exam Location:  Inpatient Procedure: 2D Echo, Cardiac Doppler and Color Doppler (Both Spectral and Color            Flow Doppler were utilized during procedure). Indications:    R06.02 SOB  History:        Patient has prior history of Echocardiogram examinations, most                 recent 12/28/2020. Signs/Symptoms:Altered Mental Status; Risk                 Factors:Hypertension, Diabetes and Dyslipidemia.  Sonographer:    Ellouise Mose RDCS Referring Phys: [id number] VISHAL R PATEL  Sonographer Comments: Patient with restrictive clothing IMPRESSIONS  1. Left ventricular ejection fraction, by estimation, is 65 to 70%. The left ventricle has normal function. The left ventricle has no regional wall motion abnormalities. Left ventricular diastolic parameters are consistent with Grade I diastolic dysfunction (impaired relaxation).  2. Right ventricular systolic function is normal. The right ventricular size is normal. There is mildly elevated pulmonary artery systolic pressure. The estimated right ventricular systolic pressure is 41.2 mmHg.  3. The mitral valve is normal in structure. Mild mitral valve regurgitation. No evidence of mitral stenosis.  4. The aortic valve is tricuspid. Aortic valve regurgitation  is mild to moderate. No aortic stenosis is present.  5. The inferior vena cava is dilated in size with <50% respiratory variability, suggesting right atrial pressure of 15 mmHg. FINDINGS  Left Ventricle: Left ventricular ejection fraction, by estimation, is 65 to 70%. The left ventricle has normal function. The left ventricle has no regional wall motion abnormalities. The left ventricular internal cavity size was normal in size. There is  no left ventricular hypertrophy. Left ventricular diastolic parameters are consistent with Grade I diastolic dysfunction (impaired relaxation). Right Ventricle: The right ventricular size is normal. No increase in right ventricular wall thickness. Right ventricular systolic function is normal. There is mildly elevated pulmonary artery systolic pressure. The tricuspid regurgitant velocity is 2.56  m/s, and with an assumed right atrial pressure of 15 mmHg, the estimated right ventricular systolic pressure is 41.2 mmHg. Left Atrium: Left atrial size was normal in size. Right Atrium: Right atrial size was normal in size. Pericardium: There is no evidence of pericardial effusion. Mitral Valve: The mitral valve is normal in structure. Mild mitral valve regurgitation. No evidence of mitral valve stenosis. Tricuspid Valve: The tricuspid valve is normal in structure. Tricuspid valve regurgitation is trivial. No evidence of tricuspid stenosis. Aortic Valve: The aortic valve is tricuspid. Aortic valve regurgitation is mild to moderate. Aortic regurgitation PHT measures 315 msec. No aortic stenosis is present.  Pulmonic Valve: The pulmonic valve was normal in structure. Pulmonic valve regurgitation is trivial. No evidence of pulmonic stenosis. Aorta: The aortic root and ascending aorta are structurally normal, with no evidence of dilitation. Venous: The inferior vena cava is dilated in size with less than 50% respiratory variability, suggesting right atrial pressure of 15 mmHg. IAS/Shunts: No  atrial level shunt detected by color flow Doppler.  LEFT VENTRICLE PLAX 2D LVIDd:         3.80 cm     Diastology LVIDs:         2.20 cm     LV e' medial:    6.74 cm/s LV PW:         1.10 cm     LV E/e' medial:  12.5 LV IVS:        1.00 cm     LV e' lateral:   10.10 cm/s LVOT diam:     2.30 cm     LV E/e' lateral: 8.4 LV SV:         83 LV SV Index:   45 LVOT Area:     4.15 cm  LV Volumes (MOD) LV vol d, MOD A2C: 77.5 ml LV vol d, MOD A4C: 72.8 ml LV vol s, MOD A2C: 22.9 ml LV vol s, MOD A4C: 22.5 ml LV SV MOD A2C:     54.6 ml LV SV MOD A4C:     72.8 ml LV SV MOD BP:      55.4 ml RIGHT VENTRICLE             IVC RV S prime:     13.60 cm/s  IVC diam: 2.40 cm TAPSE (M-mode): 1.9 cm LEFT ATRIUM             Index        RIGHT ATRIUM           Index LA diam:        4.00 cm 2.19 cm/m   RA Area:     11.90 cm LA Vol (A2C):   29.2 ml 16.02 ml/m  RA Volume:   25.80 ml  14.16 ml/m LA Vol (A4C):   23.1 ml 12.67 ml/m LA Biplane Vol: 25.8 ml 14.16 ml/m  AORTIC VALVE LVOT Vmax:   116.00 cm/s LVOT Vmean:  75.800 cm/s LVOT VTI:    0.199 m AI PHT:      315 msec  AORTA Ao Root diam: 2.70 cm Ao Asc diam:  3.10 cm MITRAL VALVE               TRICUSPID VALVE MV Area (PHT): 4.49 cm    TR Peak grad:   26.2 mmHg MV Decel Time: 169 msec    TR Vmax:        256.00 cm/s MV E velocity: 84.50 cm/s MV A velocity: 93.00 cm/s  SHUNTS MV E/A ratio:  0.91        Systemic VTI:  0.20 m                            Systemic Diam: 2.30 cm Morene Brownie Electronically signed by Morene Brownie Signature Date/Time: 01/28/2024/6:07:32 PM    Final    CT Angio Chest Pulmonary Embolism (PE) W or WO Contrast Result Date: 01/28/2024 CLINICAL DATA:  PE suspected, dyspnea and hypoxia * Tracking Code: BO * EXAM: CT ANGIOGRAPHY CHEST WITH CONTRAST TECHNIQUE: Multidetector CT imaging of the chest was performed using the  standard protocol during bolus administration of intravenous contrast. Multiplanar CT image reconstructions and MIPs were obtained to evaluate  the vascular anatomy. RADIATION DOSE REDUCTION: This exam was performed according to the departmental dose-optimization program which includes automated exposure control, adjustment of the mA and/or kV according to patient size and/or use of iterative reconstruction technique. CONTRAST:  60mL OMNIPAQUE  IOHEXOL  350 MG/ML SOLN COMPARISON:  11/10/2022 FINDINGS: Cardiovascular: Satisfactory opacification of the pulmonary arteries to the segmental level. No evidence of pulmonary embolism. Normal heart size. No pericardial effusion. Aortic atherosclerosis. Mediastinum/Nodes: No enlarged mediastinal, hilar, or axillary lymph nodes. Large, heterogeneous right lobe thyroid  goiter (series 4, image 9). Status post left thyroidectomy. Trachea, and esophagus demonstrate no significant findings. Lungs/Pleura: Dependent bibasilar scarring or atelectasis, unchanged compared to prior examination. No pleural effusion or pneumothorax. Upper Abdomen: No acute abnormality. Musculoskeletal: No chest wall abnormality. No acute osseous findings. Review of the MIP images confirms the above findings. IMPRESSION: 1. Negative examination for pulmonary embolism. 2. Dependent bibasilar scarring or atelectasis, unchanged compared to prior examination. No new airspace opacity. 3. 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 Presumably there has been previous ultrasound evaluation. Aortic Atherosclerosis (ICD10-I70.0). Electronically Signed   By: Marolyn JONETTA Jaksch M.D.   On: 01/28/2024 07:10   CT Head Wo Contrast Result Date: 01/27/2024 CLINICAL DATA:  Altered mental status EXAM: CT HEAD WITHOUT CONTRAST TECHNIQUE: Contiguous axial images were obtained from the base of the skull through the vertex without intravenous contrast. RADIATION DOSE REDUCTION: This exam was performed according to the departmental dose-optimization program which includes automated exposure control, adjustment of the mA and/or kV according  to patient size and/or use of iterative reconstruction technique. COMPARISON:  05/19/2023 FINDINGS: Brain: No evidence of acute infarction, hemorrhage, hydrocephalus, extra-axial collection or mass lesion/mass effect. Extensive periventricular and deep white matter hypodensity. Vascular: No hyperdense vessel or unexpected calcification. Skull: Normal. Negative for fracture or focal lesion. Sinuses/Orbits: No acute finding. Other: None. IMPRESSION: No acute intracranial pathology. Advanced small-vessel white matter disease. Electronically Signed   By: Marolyn JONETTA Jaksch M.D.   On: 01/27/2024 15:51   DG Chest Port 1 View Result Date: 01/27/2024 CLINICAL DATA:  Shortness of breath.  Pitting edema in both legs. EXAM: PORTABLE CHEST 1 VIEW COMPARISON:  01/06/2023 and CT chest 11/10/2022. FINDINGS: Patient is rotated. Heart size stable. Lungs are low in volume with central pulmonary vascular congestion and bibasilar scarring. No pleural fluid. IMPRESSION: Low lung volumes with central pulmonary vascular congestion and bibasilar scarring. Electronically Signed   By: Newell Eke M.D.   On: 01/27/2024 13:31     Subjective: Patient seen examined bedside, lying in bed.  RN present at bedside.  No specific complaints this morning.  Discussed discharging back to ALF with home health and continued antibiotic treatment with oral cefadroxil .  Discussed will need to continue dressing changes, mild compression and recommend leg elevation is much as possible to decrease pooling of fluid in her lower extremities which is likely a large contributing factor to her bilateral cellulitis.  The patient with no other specific complaints, concerns, questions at this time.  Denies headache, no dizziness, no chest pain, no shortness of breath, no abdominal pain, no fever, no nausea cefonicid diarrhea.  No acute events overnight per nursing staff.  Discharge Exam: Vitals:   02/14/24 1939 02/15/24 0538  BP: (!) 135/58 (!) 153/59   Pulse: 88 96  Resp: 18 18  Temp: 98.7 F (37.1 C) 99.2 F (37.3 C)  SpO2:  92% 92%   Vitals:   02/14/24 0947 02/14/24 1327 02/14/24 1939 02/15/24 0538  BP: 102/69 126/61 (!) 135/58 (!) 153/59  Pulse: 98 100 88 96  Resp:  14 18 18   Temp:  99 F (37.2 C) 98.7 F (37.1 C) 99.2 F (37.3 C)  TempSrc:  Oral Oral Oral  SpO2:  90% 92% 92%  Weight:      Height:        Physical Exam: GEN: NAD, alert and oriented x 3, chronically ill in appearance HEENT: NCAT, PERRL, EOMI, sclera clear, MMM PULM: CTAB w/o wheezes/crackles, normal respiratory effort, on room air CV: RRR w/o M/G/R GI: abd soft, NTND, + BS MSK: + Bilateral lower extremity peripheral edema with surrounding erythema, no fluctuance or purulent discharge, slight TTP bilaterally NEURO: CN II-XII intact, no focal deficits, sensation to light touch intact PSYCH: normal mood/affect Integumentary: Bilateral lower extremity wounds/erythema as depicted below at time of admission, candidal rash noted to pannus/abdomen's; otherwise no other concerning rashes/lesions/wounds noted on exposed skin surfaces       The results of significant diagnostics from this hospitalization (including imaging, microbiology, ancillary and laboratory) are listed below for reference.     Microbiology: Recent Results (from the past 240 hours)  MRSA Next Gen by PCR, Nasal     Status: Abnormal   Collection Time: 02/12/24  7:30 AM   Specimen: Nasal Mucosa; Nasal Swab  Result Value Ref Range Status   MRSA by PCR Next Gen DETECTED (A) NOT DETECTED Final    Comment: RESULT CALLED TO, READ BACK BY AND VERIFIED WITH: R. PEAKS ON 02/12/2024 AT 1251 BY SL (NOTE) The GeneXpert MRSA Assay (FDA approved for NASAL specimens only), is one component of a comprehensive MRSA colonization surveillance program. It is not intended to diagnose MRSA infection nor to guide or monitor treatment for MRSA infections. Test performance is not FDA approved in patients  less than 44 years old. Performed at [facility], [address], [place], [place] [address]      Labs: BNP (last 3 results) Recent Labs    01/27/24 1249 02/11/24 1727  BNP 122.7* 42.0   Basic Metabolic Panel: Recent Labs  Lab 02/11/24 1727 02/12/24 0555 02/13/24 0724  NA 143 141 139  K 4.4 3.8 3.5  CL 106 110 107  CO2 25 23 23   GLUCOSE 139* 88 87  BUN 26* 22 21  CREATININE 1.34* 1.30* 1.23*  CALCIUM  7.9* 7.2* 7.2*  MG 2.4 2.0  --   PHOS 4.3 3.3  --    Liver Function Tests: Recent Labs  Lab 02/11/24 1727 02/12/24 0555  AST 29 24  ALT 32 27  ALKPHOS 116 94  BILITOT 0.3 0.4  PROT 6.8 5.4*  ALBUMIN  3.2* 2.6*   No results for input(s): LIPASE, AMYLASE in the last 168 hours. No results for input(s): AMMONIA in the last 168 hours. CBC: Recent Labs  Lab 02/11/24 1727 02/12/24 0555 02/13/24 0724  WBC 10.9* 7.8 6.5  NEUTROABS 9.1*  --   --   HGB 9.8* 8.9* 9.5*  HCT 32.7* 29.2* 31.5*  MCV 94.8 95.4 94.3  PLT 243 204 212   Cardiac Enzymes: Recent Labs  Lab 02/11/24 1727  CKTOTAL 211   BNP: Invalid input(s): POCBNP CBG: Recent Labs  Lab 02/12/24 1606 02/12/24 2029 02/13/24 0021 02/13/24 0420 02/13/24 0811  GLUCAP 110* 133* 83 98 93   D-Dimer No results for input(s): DDIMER in the last 72 hours. Hgb A1c No  results for input(s): HGBA1C in the last 72 hours. Lipid Profile No results for input(s): CHOL, HDL, LDLCALC, TRIG, CHOLHDL, LDLDIRECT in the last 72 hours. Thyroid  function studies No results for input(s): TSH, T4TOTAL, T3FREE, THYROIDAB in the last 72 hours.  Invalid input(s): FREET3 Anemia work up No results for input(s): VITAMINB12, FOLATE, FERRITIN, TIBC, IRON, RETICCTPCT in the last 72 hours. Urinalysis    Component Value Date/Time   COLORURINE STRAW (A) 01/27/2024 1301   APPEARANCEUR CLEAR 01/27/2024 1301   LABSPEC 1.003 (L) 01/27/2024 1301   PHURINE 5.0  01/27/2024 1301   GLUCOSEU NEGATIVE 01/27/2024 1301   HGBUR NEGATIVE 01/27/2024 1301   BILIRUBINUR NEGATIVE 01/27/2024 1301   KETONESUR NEGATIVE 01/27/2024 1301   PROTEINUR NEGATIVE 01/27/2024 1301   UROBILINOGEN 0.2 02/01/2011 1327   NITRITE NEGATIVE 01/27/2024 1301   LEUKOCYTESUR NEGATIVE 01/27/2024 1301   Sepsis Labs Recent Labs  Lab 02/11/24 1727 02/12/24 0555 02/13/24 0724  WBC 10.9* 7.8 6.5   Microbiology Recent Results (from the past 240 hours)  MRSA Next Gen by PCR, Nasal     Status: Abnormal   Collection Time: 02/12/24  7:30 AM   Specimen: Nasal Mucosa; Nasal Swab  Result Value Ref Range Status   MRSA by PCR Next Gen DETECTED (A) NOT DETECTED Final    Comment: RESULT CALLED TO, READ BACK BY AND VERIFIED WITH: R. PEAKS ON 02/12/2024 AT 1251 BY SL (NOTE) The GeneXpert MRSA Assay (FDA approved for NASAL specimens only), is one component of a comprehensive MRSA colonization surveillance program. It is not intended to diagnose MRSA infection nor to guide or monitor treatment for MRSA infections. Test performance is not FDA approved in patients less than 26 years old. Performed at [facility], [address], [place], [place] [address]      Time coordinating discharge: Over 30 minutes  SIGNED:   Camellia PARAS [place], DO  [facility] 02/15/2024, 9:30 AM

## 2024-02-15 NOTE — Plan of Care (Signed)
  Problem: Education: Goal: Knowledge of General Education information will improve Description: Including pain rating scale, medication(s)/side effects and non-pharmacologic comfort measures Outcome: Progressing   Problem: Nutrition: Goal: Adequate nutrition will be maintained Outcome: Progressing   Problem: Elimination: Goal: Will not experience complications related to bowel motility Outcome: Progressing   Problem: Pain Managment: Goal: General experience of comfort will improve and/or be controlled Outcome: Progressing   Problem: Safety: Goal: Ability to remain free from injury will improve Outcome: Progressing

## 2024-02-15 NOTE — NC FL2 (Signed)
 [place]  MEDICAID FL2 LEVEL OF CARE FORM     IDENTIFICATION  Patient Name: [name] Birthdate: January 10, 1946 Sex: female Admission Date (Current Location): 02/11/2024  [facility] and [place] Number:  Producer, television/film/video and Address:  [facility],  [address], [place] [address]      Provider Number: [id number]  Attending Physician Name and Address:  U[place], Eric J, DO  Relative Name and Phone Number:  Nena Ellen (sister) [phone]    Current Level of Care: Hospital Recommended Level of Care: Assisted Living Facility Prior Approval Number:    Date Approved/Denied:   PASRR Number:    Discharge Plan: Other (Comment)    Current Diagnoses: Patient Active Problem List   Diagnosis Date Noted   Cellulitis of both lower extremities 02/12/2024   Cellulitis 02/11/2024   Candida infection 02/11/2024   Fever 01/28/2024   Venous stasis of both lower extremities 01/28/2024   Dyspnea 01/27/2024   Abdominal pain 10/30/2022   Abdominal fluid collection 10/30/2022   H/O transurethral resection of bladder tumor (TURBT) 10/30/2022   Bladder injury 10/30/2022   Acute renal failure superimposed on stage 3a chronic kidney disease (HCC) 10/30/2022   Cervical myelopathy (HCC) 06/14/2021   Anxiety    CKD (chronic kidney disease), stage III (HCC)    Cervical stenosis of spinal canal    Hyperlipidemia associated with type 2 diabetes mellitus (HCC) 03/14/2021   Acute respiratory failure with hypoxia (HCC) 01/01/2021   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   Cognitive impairment 04/17/2019   Degeneration of lumbar intervertebral disc 10/15/2018   MDD (major depressive disorder), recurrent episode, severe (HCC) 08/10/2018   MDD (major depressive disorder), recurrent, severe, with psychosis (HCC)    Altered mental status 05/24/2018   Ketonuria 05/24/2018   Pain in right hand 09/11/2017   Essential hypertension 03/07/2014   Hyperlipidemia 03/07/2014   Depression 03/07/2014    Spinal stenosis of lumbar region with neurogenic claudication 02/24/2014   Type 2 diabetes mellitus without complication (HCC) 02/24/2014   Rheumatoid arthritis (HCC) 02/24/2014   Scoliosis of lumbar spine 02/13/2014    Orientation RESPIRATION BLADDER Height & Weight     Self, Time, Situation, Place  Normal   Weight: 73.9 kg Height:  5' 4 (162.6 cm)  BEHAVIORAL SYMPTOMS/MOOD NEUROLOGICAL BOWEL NUTRITION STATUS     (n/a)   Diet (Heart health /carb modified)  AMBULATORY STATUS COMMUNICATION OF NEEDS Skin   Extensive Assist ([place]) Verbally Other (Comment) (redness noted to bilateral legs skin is flaky and dry)                       Personal Care Assistance Level of Assistance  Bathing, Feeding, Dressing   Feeding assistance: Limited assistance Dressing Assistance: Limited assistance     Functional Limitations Info  Sight, Hearing, Speech Sight Info: Adequate Hearing Info: Adequate Speech Info: Adequate    SPECIAL CARE FACTORS FREQUENCY                       Contractures Contractures Info: Not present    Additional Factors Info  Code Status, Allergies, Psychotropic, Insulin  Sliding Scale, Isolation Precautions, Suctioning Needs Code Status Info: Full Allergies Info: : Iodinated Contrast Media, Abilify (Aripiprazole), Ultram (Tramadol Hcl), Zithromax (Azithromycin) Psychotropic Info: n/a Insulin  Sliding Scale Info: see discharge summary Isolation Precautions Info: see discharge summary Suctioning Needs: n/a   Current Medications (02/15/2024):  This is the current hospital active medication list Current Facility-Administered Medications  Medication  Dose Route Frequency Provider Last Rate Last Admin   acetaminophen  (TYLENOL ) tablet 650 mg  650 mg Oral Q6H PRN Doutova, Anastassia, MD       Or   acetaminophen  (TYLENOL ) suppository 650 mg  650 mg Rectal Q6H PRN Doutova, Anastassia, MD       albuterol  (PROVENTIL ) (2.5 MG/3ML) 0.083% nebulizer solution 2.5 mg   2.5 mg Nebulization Q2H PRN Doutova, Anastassia, MD       ALPRAZolam  (XANAX ) tablet 0.25 mg  0.25 mg Oral BID PRN Doutova, Anastassia, MD   0.25 mg at 02/14/24 2100   aspirin  chewable tablet 81 mg  81 mg Oral Daily Doutova, Anastassia, MD   81 mg at 02/15/24 0842   benztropine  (COGENTIN ) tablet 0.5 mg  0.5 mg Oral BID Doutova, Anastassia, MD   0.5 mg at 02/15/24 0842   ceFAZolin  (ANCEF ) IVPB 2g/100 mL premix  2 g Intravenous Q8H Poindexter, Leann T, RPH 200 mL/hr at 02/15/24 0521 2 g at 02/15/24 9478   escitalopram  (LEXAPRO ) tablet 15 mg  15 mg Oral Daily Doutova, Anastassia, MD   15 mg at 02/15/24 9157   fentaNYL  (SUBLIMAZE ) injection 12.5-50 mcg  12.5-50 mcg Intravenous Q2H PRN Doutova, Anastassia, MD       ferrous sulfate  tablet 325 mg  325 mg Oral Q M,W,F [place], Eric J, DO   325 mg at 02/15/24 9157   furosemide  (LASIX ) injection 20 mg  20 mg Intravenous Daily [place], Eric J, DO   20 mg at 02/15/24 9158   HYDROcodone -acetaminophen  (NORCO/VICODIN) 5-325 MG per tablet 1-2 tablet  1-2 tablet Oral Q4H PRN Doutova, Anastassia, MD   1 tablet at 02/14/24 1510   hydroxychloroquine  (PLAQUENIL ) tablet 600 mg  600 mg Oral Daily Doutova, Anastassia, MD   600 mg at 02/15/24 0841   leflunomide  (ARAVA ) tablet 20 mg  20 mg Oral Daily Doutova, Anastassia, MD   20 mg at 02/15/24 9157   loratadine  (CLARITIN ) tablet 10 mg  10 mg Oral Daily [place], Eric J, DO   10 mg at 02/15/24 9157   metoprolol  succinate (TOPROL -XL) 24 hr tablet 25 mg  25 mg Oral Daily Doutova, Anastassia, MD   25 mg at 02/15/24 9157   nystatin  (MYCOSTATIN /NYSTOP ) topical powder   Topical TID Doutova, Anastassia, MD   Given at 02/15/24 0841   OLANZapine  (ZYPREXA ) tablet 5 mg  5 mg Oral Daily Doutova, Anastassia, MD   5 mg at 02/15/24 0843   ondansetron  (ZOFRAN ) tablet 4 mg  4 mg Oral Q6H PRN Doutova, Anastassia, MD       Or   ondansetron  (ZOFRAN ) injection 4 mg  4 mg Intravenous Q6H PRN Doutova, Anastassia, MD       polyethylene glycol  (MIRALAX  / GLYCOLAX ) packet 17 g  17 g Oral Daily PRN Doutova, Anastassia, MD       predniSONE  (DELTASONE ) tablet 5 mg  5 mg Oral Daily Doutova, Anastassia, MD   5 mg at 02/15/24 0842   sodium chloride  flush (NS) 0.9 % injection 3 mL  3 mL Intravenous Q12H Doutova, Anastassia, MD   3 mL at 02/15/24 0841   sodium chloride  flush (NS) 0.9 % injection 3 mL  3 mL Intravenous PRN Doutova, Anastassia, MD         Discharge Medications: Please see discharge summary for a list of discharge medications.  Relevant Imaging Results:  Relevant Lab Results:   Additional Information SS# [id number]  Toy LITTIE Agar, RN

## 2024-02-17 DIAGNOSIS — M79605 Pain in left leg: Secondary | ICD-10-CM | POA: Diagnosis not present

## 2024-02-17 DIAGNOSIS — L0390 Cellulitis, unspecified: Secondary | ICD-10-CM | POA: Diagnosis not present

## 2024-02-17 DIAGNOSIS — M79604 Pain in right leg: Secondary | ICD-10-CM | POA: Diagnosis not present

## 2024-02-18 DIAGNOSIS — L03115 Cellulitis of right lower limb: Secondary | ICD-10-CM | POA: Diagnosis not present

## 2024-02-18 DIAGNOSIS — I130 Hypertensive heart and chronic kidney disease with heart failure and stage 1 through stage 4 chronic kidney disease, or unspecified chronic kidney disease: Secondary | ICD-10-CM | POA: Diagnosis not present

## 2024-02-18 DIAGNOSIS — G3184 Mild cognitive impairment, so stated: Secondary | ICD-10-CM | POA: Diagnosis not present

## 2024-02-18 DIAGNOSIS — M069 Rheumatoid arthritis, unspecified: Secondary | ICD-10-CM | POA: Diagnosis not present

## 2024-02-18 DIAGNOSIS — J309 Allergic rhinitis, unspecified: Secondary | ICD-10-CM | POA: Diagnosis not present

## 2024-02-18 DIAGNOSIS — K5900 Constipation, unspecified: Secondary | ICD-10-CM | POA: Diagnosis not present

## 2024-02-18 DIAGNOSIS — G4700 Insomnia, unspecified: Secondary | ICD-10-CM | POA: Diagnosis not present

## 2024-02-18 DIAGNOSIS — L03116 Cellulitis of left lower limb: Secondary | ICD-10-CM | POA: Diagnosis not present

## 2024-02-18 DIAGNOSIS — L97812 Non-pressure chronic ulcer of other part of right lower leg with fat layer exposed: Secondary | ICD-10-CM | POA: Diagnosis not present

## 2024-02-18 DIAGNOSIS — I5032 Chronic diastolic (congestive) heart failure: Secondary | ICD-10-CM | POA: Diagnosis not present

## 2024-02-18 DIAGNOSIS — N183 Chronic kidney disease, stage 3 unspecified: Secondary | ICD-10-CM | POA: Diagnosis not present

## 2024-02-18 DIAGNOSIS — E785 Hyperlipidemia, unspecified: Secondary | ICD-10-CM | POA: Diagnosis not present

## 2024-02-18 DIAGNOSIS — E1151 Type 2 diabetes mellitus with diabetic peripheral angiopathy without gangrene: Secondary | ICD-10-CM | POA: Diagnosis not present

## 2024-02-18 DIAGNOSIS — E559 Vitamin D deficiency, unspecified: Secondary | ICD-10-CM | POA: Diagnosis not present

## 2024-02-18 DIAGNOSIS — M4800 Spinal stenosis, site unspecified: Secondary | ICD-10-CM | POA: Diagnosis not present

## 2024-02-18 DIAGNOSIS — D631 Anemia in chronic kidney disease: Secondary | ICD-10-CM | POA: Diagnosis not present

## 2024-02-18 DIAGNOSIS — L97822 Non-pressure chronic ulcer of other part of left lower leg with fat layer exposed: Secondary | ICD-10-CM | POA: Diagnosis not present

## 2024-02-18 DIAGNOSIS — E1122 Type 2 diabetes mellitus with diabetic chronic kidney disease: Secondary | ICD-10-CM | POA: Diagnosis not present

## 2024-02-18 DIAGNOSIS — Z993 Dependence on wheelchair: Secondary | ICD-10-CM | POA: Diagnosis not present

## 2024-02-18 DIAGNOSIS — I872 Venous insufficiency (chronic) (peripheral): Secondary | ICD-10-CM | POA: Diagnosis not present

## 2024-02-23 DIAGNOSIS — N183 Chronic kidney disease, stage 3 unspecified: Secondary | ICD-10-CM | POA: Diagnosis not present

## 2024-02-23 DIAGNOSIS — E1151 Type 2 diabetes mellitus with diabetic peripheral angiopathy without gangrene: Secondary | ICD-10-CM | POA: Diagnosis not present

## 2024-02-23 DIAGNOSIS — L03116 Cellulitis of left lower limb: Secondary | ICD-10-CM | POA: Diagnosis not present

## 2024-02-23 DIAGNOSIS — L97812 Non-pressure chronic ulcer of other part of right lower leg with fat layer exposed: Secondary | ICD-10-CM | POA: Diagnosis not present

## 2024-02-23 DIAGNOSIS — M069 Rheumatoid arthritis, unspecified: Secondary | ICD-10-CM | POA: Diagnosis not present

## 2024-02-23 DIAGNOSIS — L97822 Non-pressure chronic ulcer of other part of left lower leg with fat layer exposed: Secondary | ICD-10-CM | POA: Diagnosis not present

## 2024-02-23 DIAGNOSIS — M4800 Spinal stenosis, site unspecified: Secondary | ICD-10-CM | POA: Diagnosis not present

## 2024-02-23 DIAGNOSIS — G3184 Mild cognitive impairment, so stated: Secondary | ICD-10-CM | POA: Diagnosis not present

## 2024-02-23 DIAGNOSIS — D631 Anemia in chronic kidney disease: Secondary | ICD-10-CM | POA: Diagnosis not present

## 2024-02-23 DIAGNOSIS — I130 Hypertensive heart and chronic kidney disease with heart failure and stage 1 through stage 4 chronic kidney disease, or unspecified chronic kidney disease: Secondary | ICD-10-CM | POA: Diagnosis not present

## 2024-02-23 DIAGNOSIS — I5032 Chronic diastolic (congestive) heart failure: Secondary | ICD-10-CM | POA: Diagnosis not present

## 2024-02-23 DIAGNOSIS — Z993 Dependence on wheelchair: Secondary | ICD-10-CM | POA: Diagnosis not present

## 2024-02-23 DIAGNOSIS — G4700 Insomnia, unspecified: Secondary | ICD-10-CM | POA: Diagnosis not present

## 2024-02-23 DIAGNOSIS — L03115 Cellulitis of right lower limb: Secondary | ICD-10-CM | POA: Diagnosis not present

## 2024-02-23 DIAGNOSIS — E559 Vitamin D deficiency, unspecified: Secondary | ICD-10-CM | POA: Diagnosis not present

## 2024-02-23 DIAGNOSIS — I872 Venous insufficiency (chronic) (peripheral): Secondary | ICD-10-CM | POA: Diagnosis not present

## 2024-02-23 DIAGNOSIS — E1122 Type 2 diabetes mellitus with diabetic chronic kidney disease: Secondary | ICD-10-CM | POA: Diagnosis not present

## 2024-02-23 DIAGNOSIS — E785 Hyperlipidemia, unspecified: Secondary | ICD-10-CM | POA: Diagnosis not present

## 2024-02-23 DIAGNOSIS — J309 Allergic rhinitis, unspecified: Secondary | ICD-10-CM | POA: Diagnosis not present

## 2024-02-23 DIAGNOSIS — K5900 Constipation, unspecified: Secondary | ICD-10-CM | POA: Diagnosis not present

## 2024-02-26 DIAGNOSIS — L03115 Cellulitis of right lower limb: Secondary | ICD-10-CM | POA: Diagnosis not present

## 2024-02-26 DIAGNOSIS — E559 Vitamin D deficiency, unspecified: Secondary | ICD-10-CM | POA: Diagnosis not present

## 2024-02-26 DIAGNOSIS — E785 Hyperlipidemia, unspecified: Secondary | ICD-10-CM | POA: Diagnosis not present

## 2024-02-26 DIAGNOSIS — M4800 Spinal stenosis, site unspecified: Secondary | ICD-10-CM | POA: Diagnosis not present

## 2024-02-26 DIAGNOSIS — L97822 Non-pressure chronic ulcer of other part of left lower leg with fat layer exposed: Secondary | ICD-10-CM | POA: Diagnosis not present

## 2024-02-26 DIAGNOSIS — M069 Rheumatoid arthritis, unspecified: Secondary | ICD-10-CM | POA: Diagnosis not present

## 2024-02-26 DIAGNOSIS — G3184 Mild cognitive impairment, so stated: Secondary | ICD-10-CM | POA: Diagnosis not present

## 2024-02-26 DIAGNOSIS — I5032 Chronic diastolic (congestive) heart failure: Secondary | ICD-10-CM | POA: Diagnosis not present

## 2024-02-26 DIAGNOSIS — L97812 Non-pressure chronic ulcer of other part of right lower leg with fat layer exposed: Secondary | ICD-10-CM | POA: Diagnosis not present

## 2024-02-26 DIAGNOSIS — I130 Hypertensive heart and chronic kidney disease with heart failure and stage 1 through stage 4 chronic kidney disease, or unspecified chronic kidney disease: Secondary | ICD-10-CM | POA: Diagnosis not present

## 2024-02-26 DIAGNOSIS — K5900 Constipation, unspecified: Secondary | ICD-10-CM | POA: Diagnosis not present

## 2024-02-26 DIAGNOSIS — I872 Venous insufficiency (chronic) (peripheral): Secondary | ICD-10-CM | POA: Diagnosis not present

## 2024-02-26 DIAGNOSIS — L03116 Cellulitis of left lower limb: Secondary | ICD-10-CM | POA: Diagnosis not present

## 2024-02-26 DIAGNOSIS — E1122 Type 2 diabetes mellitus with diabetic chronic kidney disease: Secondary | ICD-10-CM | POA: Diagnosis not present

## 2024-02-26 DIAGNOSIS — D631 Anemia in chronic kidney disease: Secondary | ICD-10-CM | POA: Diagnosis not present

## 2024-02-26 DIAGNOSIS — Z993 Dependence on wheelchair: Secondary | ICD-10-CM | POA: Diagnosis not present

## 2024-02-26 DIAGNOSIS — E1151 Type 2 diabetes mellitus with diabetic peripheral angiopathy without gangrene: Secondary | ICD-10-CM | POA: Diagnosis not present

## 2024-02-26 DIAGNOSIS — G4700 Insomnia, unspecified: Secondary | ICD-10-CM | POA: Diagnosis not present

## 2024-02-26 DIAGNOSIS — N183 Chronic kidney disease, stage 3 unspecified: Secondary | ICD-10-CM | POA: Diagnosis not present

## 2024-02-26 DIAGNOSIS — J309 Allergic rhinitis, unspecified: Secondary | ICD-10-CM | POA: Diagnosis not present

## 2024-03-01 ENCOUNTER — Emergency Department (HOSPITAL_COMMUNITY)

## 2024-03-01 ENCOUNTER — Other Ambulatory Visit: Payer: Self-pay

## 2024-03-01 ENCOUNTER — Emergency Department (HOSPITAL_COMMUNITY)
Admission: EM | Admit: 2024-03-01 | Discharge: 2024-03-01 | Disposition: A | Discharge status: Home / Self Care | Source: Skilled Nursing Facility | Attending: Emergency Medicine | Admitting: Emergency Medicine

## 2024-03-01 ENCOUNTER — Encounter (HOSPITAL_COMMUNITY): Payer: Self-pay | Admitting: Radiology

## 2024-03-01 DIAGNOSIS — I5032 Chronic diastolic (congestive) heart failure: Secondary | ICD-10-CM | POA: Diagnosis not present

## 2024-03-01 DIAGNOSIS — Z043 Encounter for examination and observation following other accident: Secondary | ICD-10-CM | POA: Diagnosis not present

## 2024-03-01 DIAGNOSIS — J309 Allergic rhinitis, unspecified: Secondary | ICD-10-CM | POA: Diagnosis not present

## 2024-03-01 DIAGNOSIS — R918 Other nonspecific abnormal finding of lung field: Secondary | ICD-10-CM | POA: Diagnosis not present

## 2024-03-01 DIAGNOSIS — L03116 Cellulitis of left lower limb: Secondary | ICD-10-CM | POA: Diagnosis not present

## 2024-03-01 DIAGNOSIS — I509 Heart failure, unspecified: Secondary | ICD-10-CM | POA: Insufficient documentation

## 2024-03-01 DIAGNOSIS — Z7401 Bed confinement status: Secondary | ICD-10-CM | POA: Diagnosis not present

## 2024-03-01 DIAGNOSIS — Z7982 Long term (current) use of aspirin: Secondary | ICD-10-CM | POA: Diagnosis not present

## 2024-03-01 DIAGNOSIS — W01198A Fall on same level from slipping, tripping and stumbling with subsequent striking against other object, initial encounter: Secondary | ICD-10-CM | POA: Diagnosis not present

## 2024-03-01 DIAGNOSIS — L97812 Non-pressure chronic ulcer of other part of right lower leg with fat layer exposed: Secondary | ICD-10-CM | POA: Diagnosis not present

## 2024-03-01 DIAGNOSIS — N183 Chronic kidney disease, stage 3 unspecified: Secondary | ICD-10-CM | POA: Diagnosis not present

## 2024-03-01 DIAGNOSIS — S40011A Contusion of right shoulder, initial encounter: Secondary | ICD-10-CM | POA: Insufficient documentation

## 2024-03-01 DIAGNOSIS — L97822 Non-pressure chronic ulcer of other part of left lower leg with fat layer exposed: Secondary | ICD-10-CM | POA: Diagnosis not present

## 2024-03-01 DIAGNOSIS — G4700 Insomnia, unspecified: Secondary | ICD-10-CM | POA: Diagnosis not present

## 2024-03-01 DIAGNOSIS — L03115 Cellulitis of right lower limb: Secondary | ICD-10-CM | POA: Diagnosis not present

## 2024-03-01 DIAGNOSIS — M069 Rheumatoid arthritis, unspecified: Secondary | ICD-10-CM | POA: Diagnosis not present

## 2024-03-01 DIAGNOSIS — E1122 Type 2 diabetes mellitus with diabetic chronic kidney disease: Secondary | ICD-10-CM | POA: Diagnosis not present

## 2024-03-01 DIAGNOSIS — R9431 Abnormal electrocardiogram [ECG] [EKG]: Secondary | ICD-10-CM | POA: Insufficient documentation

## 2024-03-01 DIAGNOSIS — D631 Anemia in chronic kidney disease: Secondary | ICD-10-CM | POA: Diagnosis not present

## 2024-03-01 DIAGNOSIS — I130 Hypertensive heart and chronic kidney disease with heart failure and stage 1 through stage 4 chronic kidney disease, or unspecified chronic kidney disease: Secondary | ICD-10-CM | POA: Diagnosis not present

## 2024-03-01 DIAGNOSIS — M25511 Pain in right shoulder: Secondary | ICD-10-CM | POA: Diagnosis not present

## 2024-03-01 DIAGNOSIS — E785 Hyperlipidemia, unspecified: Secondary | ICD-10-CM | POA: Diagnosis not present

## 2024-03-01 DIAGNOSIS — Z993 Dependence on wheelchair: Secondary | ICD-10-CM | POA: Diagnosis not present

## 2024-03-01 DIAGNOSIS — Z743 Need for continuous supervision: Secondary | ICD-10-CM | POA: Diagnosis not present

## 2024-03-01 DIAGNOSIS — W19XXXA Unspecified fall, initial encounter: Secondary | ICD-10-CM

## 2024-03-01 DIAGNOSIS — S0083XA Contusion of other part of head, initial encounter: Secondary | ICD-10-CM | POA: Insufficient documentation

## 2024-03-01 DIAGNOSIS — M4800 Spinal stenosis, site unspecified: Secondary | ICD-10-CM | POA: Diagnosis not present

## 2024-03-01 DIAGNOSIS — I872 Venous insufficiency (chronic) (peripheral): Secondary | ICD-10-CM | POA: Diagnosis not present

## 2024-03-01 DIAGNOSIS — R519 Headache, unspecified: Secondary | ICD-10-CM | POA: Diagnosis present

## 2024-03-01 DIAGNOSIS — G3184 Mild cognitive impairment, so stated: Secondary | ICD-10-CM | POA: Diagnosis not present

## 2024-03-01 DIAGNOSIS — R0989 Other specified symptoms and signs involving the circulatory and respiratory systems: Secondary | ICD-10-CM | POA: Diagnosis not present

## 2024-03-01 DIAGNOSIS — M25519 Pain in unspecified shoulder: Secondary | ICD-10-CM | POA: Diagnosis not present

## 2024-03-01 DIAGNOSIS — K5900 Constipation, unspecified: Secondary | ICD-10-CM | POA: Diagnosis not present

## 2024-03-01 DIAGNOSIS — E559 Vitamin D deficiency, unspecified: Secondary | ICD-10-CM | POA: Diagnosis not present

## 2024-03-01 DIAGNOSIS — S199XXA Unspecified injury of neck, initial encounter: Secondary | ICD-10-CM | POA: Diagnosis not present

## 2024-03-01 DIAGNOSIS — E1151 Type 2 diabetes mellitus with diabetic peripheral angiopathy without gangrene: Secondary | ICD-10-CM | POA: Diagnosis not present

## 2024-03-01 DIAGNOSIS — I10 Essential (primary) hypertension: Secondary | ICD-10-CM | POA: Diagnosis not present

## 2024-03-01 DIAGNOSIS — R58 Hemorrhage, not elsewhere classified: Secondary | ICD-10-CM | POA: Diagnosis not present

## 2024-03-01 LAB — COMPREHENSIVE METABOLIC PANEL WITH GFR
ALT: 22 U/L (ref 0–44)
AST: 36 U/L (ref 15–41)
Albumin: 2.9 g/dL — ABNORMAL LOW (ref 3.5–5.0)
Alkaline Phosphatase: 108 U/L (ref 38–126)
Anion gap: 11 (ref 5–15)
BUN: 20 mg/dL (ref 8–23)
CO2: 27 mmol/L (ref 22–32)
Calcium: 8.3 mg/dL — ABNORMAL LOW (ref 8.9–10.3)
Chloride: 106 mmol/L (ref 98–111)
Creatinine, Ser: 1.21 mg/dL — ABNORMAL HIGH (ref 0.44–1.00)
GFR, Estimated: 46 mL/min — ABNORMAL LOW (ref >60)
Glucose, Bld: 118 mg/dL — ABNORMAL HIGH (ref 70–99)
Potassium: 3.9 mmol/L (ref 3.5–5.1)
Sodium: 144 mmol/L (ref 135–145)
Total Bilirubin: 0.4 mg/dL (ref 0.0–1.2)
Total Protein: 6.2 g/dL — ABNORMAL LOW (ref 6.5–8.1)

## 2024-03-01 LAB — CBC WITH DIFFERENTIAL/PLATELET
Abs Immature Granulocytes: 0.05 K/uL (ref 0.00–0.07)
Basophils Absolute: 0.1 K/uL (ref 0.0–0.1)
Basophils Relative: 1 %
Eosinophils Absolute: 0.3 K/uL (ref 0.0–0.5)
Eosinophils Relative: 3 %
HCT: 32.6 % — ABNORMAL LOW (ref 36.0–46.0)
Hemoglobin: 9.7 g/dL — ABNORMAL LOW (ref 12.0–15.0)
Immature Granulocytes: 1 %
Lymphocytes Relative: 13 %
Lymphs Abs: 1.2 K/uL (ref 0.7–4.0)
MCH: 28 pg (ref 26.0–34.0)
MCHC: 29.8 g/dL — ABNORMAL LOW (ref 30.0–36.0)
MCV: 93.9 fL (ref 80.0–100.0)
Monocytes Absolute: 0.9 K/uL (ref 0.1–1.0)
Monocytes Relative: 10 %
Neutro Abs: 6.6 K/uL (ref 1.7–7.7)
Neutrophils Relative %: 72 %
Platelets: 193 K/uL (ref 150–400)
RBC: 3.47 MIL/uL — ABNORMAL LOW (ref 3.87–5.11)
RDW: 14.4 % (ref 11.5–15.5)
WBC: 9 K/uL (ref 4.0–10.5)
nRBC: 0 % (ref 0.0–0.2)

## 2024-03-01 LAB — TROPONIN I (HIGH SENSITIVITY)
Troponin I (High Sensitivity): 16 ng/L (ref <18)
Troponin I (High Sensitivity): 14 ng/L (ref <18)

## 2024-03-01 LAB — BRAIN NATRIURETIC PEPTIDE: B Natriuretic Peptide: 259.7 pg/mL — ABNORMAL HIGH (ref 0.0–100.0)

## 2024-03-01 LAB — CBG MONITORING, ED: Glucose-Capillary: 164 mg/dL — ABNORMAL HIGH (ref 70–99)

## 2024-03-01 LAB — CK: Total CK: 291 U/L — ABNORMAL HIGH (ref 38–234)

## 2024-03-01 MED ORDER — DIAZEPAM 5 MG/ML IJ SOLN
2.5000 mg | Freq: Once | INTRAMUSCULAR | Status: AC
  Administered 2024-03-01: 2.5 mg via INTRAVENOUS
  Filled 2024-03-01: qty 2

## 2024-03-01 MED ORDER — ALPRAZOLAM 0.25 MG PO TABS
0.2500 mg | ORAL_TABLET | Freq: Once | ORAL | Status: AC
  Administered 2024-03-01: 0.25 mg via ORAL
  Filled 2024-03-01: qty 1

## 2024-03-01 NOTE — ED Triage Notes (Signed)
 Pt arrives from [employer] for fall on Saturday. Pt knows she fell but does not remember specifics. Bruise to right shoulder, skin abrasion to middle of forehead along with swelling to forehead and under both eyes. No bruising under eyes. A&Ox4. Pt is very anxious  136/76 HR 8619 rr 98% RA

## 2024-03-01 NOTE — ED Notes (Signed)
EKG given to Dr. Yao.  

## 2024-03-01 NOTE — Discharge Instructions (Signed)
 [name]'s workup in the ED today was reassuring. Please continue her medications and care as usual. Please ensure support and resources are available to prevent falls.

## 2024-03-01 NOTE — ED Provider Notes (Signed)
 [place] [facility] AT [facility] Provider Note   CSN: [id number] Arrival date & time: 03/01/24  1048     Patient presents with: Fall and Shoulder Pain   BLAKELYN DINGES is a 78 y.o. female.   Does not recall details but remembers falling and hitting head against wall a few days ago  Some headache and R should pain since  Reports using wheelchair at baseline     Prior to Admission medications   Medication Sig Start Date End Date Taking? Authorizing Provider  acetaminophen  (TYLENOL ) 500 MG tablet Take 1,000 mg by mouth every 6 (six) hours as needed for fever (or pain).   Yes [provider]  ALPRAZolam  (XANAX ) 0.25 MG tablet Take 1 tablet (0.25 mg total) by mouth 2 (two) times daily as needed for anxiety. 02/15/24  Yes [place], Camellia PARAS, DO  ascorbic acid (VITAMIN C) 500 MG tablet Take 500 mg by mouth every Monday, Wednesday, and Friday.   Yes [provider]  aspirin  81 MG chewable tablet Chew 81 mg by mouth daily. 05/24/21  Yes [provider]  Benzocaine-Menthol  (SORE THROAT) 15-3.6 MG LOZG Use as directed 1 lozenge in the mouth or throat every 2 (two) hours as needed (sore throat).   Yes [provider]  benztropine  (COGENTIN ) 0.5 MG tablet Take 0.5 mg by mouth in the morning and at bedtime.   Yes [provider]  Cholecalciferol  (VITAMIN D3) 50 MCG (2000 UT) TABS Take 2,000 Units by mouth daily.   Yes [provider]  Dextran 70-Hypromellose (NATURAL BALANCE TEARS OP) Place 1 drop into the right eye as needed (irritation/itching).   Yes [provider]  Dextromethorphan-guaiFENesin 5-100 MG/5ML LIQD Take 20 mLs by mouth every 4 (four) hours as needed (cough/congestion). 09/10/22  Yes [provider]  escitalopram  (LEXAPRO ) 10 MG tablet Take 10 mg by mouth daily.   Yes [provider]  ferrous sulfate  325 (65 FE) MG EC tablet Take 325 mg by mouth every Monday, Wednesday, and  Friday.   Yes [provider]  hydroxychloroquine  (PLAQUENIL ) 200 MG tablet Take 3 tablets (600 mg total) by mouth daily. 02/05/24  Yes Sebastian Toribio GAILS, MD  hydrOXYzine  (ATARAX ) 50 MG tablet Take 50 mg by mouth in the morning and at bedtime.   Yes [provider]  ipratropium (ATROVENT) 0.03 % nasal spray Place 2 sprays into both nostrils 2 (two) times daily as needed for rhinitis. 05/16/22  Yes [provider]  LASIX  20 MG tablet Take 2 tablets (40 mg total) by mouth daily. 02/15/24  Yes [place], Eric J, DO  leflunomide  (ARAVA ) 20 MG tablet Take 1 tablet (20 mg total) by mouth daily. 02/05/24  Yes Sebastian Toribio GAILS, MD  loperamide (IMODIUM A-D) 2 MG tablet Take 2 mg by mouth as needed for diarrhea or loose stools (after each loose stool- MAX OF 3 TABLETS/24 HOURS).   Yes [provider]  loratadine  (CLARITIN ) 10 MG tablet Take 1 tablet (10 mg total) by mouth daily. 08/22/22  Yes Bernis Ernst, PA-C  melatonin 5 MG TABS Take 5 mg by mouth at bedtime.   Yes [provider]  Menthol -Zinc Oxide (CALMOSEPTINE) 0.44-20.6 % OINT Place 1 Application into the perineum 3 (three) times daily as needed (reason not listed).   Yes [provider]  metoprolol  succinate (TOPROL -XL) 25 MG 24 hr tablet Take 25 mg by mouth daily.   Yes [provider]  Multiple Vitamins-Minerals (CENTRUM SILVER PO) Take  1 tablet by mouth daily with breakfast.   Yes [provider]  NON FORMULARY Place 1 application  into the right eye See admin instructions. Cool compress- 1 application to right eye as needed (reason not listed)   Yes [provider]  nystatin  cream (MYCOSTATIN ) Apply 1 Application topically 2 (two) times daily as needed (for redness- under abdominal folds and/or under the breasts). 07/24/22  Yes [provider]  nystatin  powder Apply 1 Application topically daily as needed (after bathing for brief change with excessive soiling).   Yes  [provider]  OLANZapine  (ZYPREXA ) 5 MG tablet Take 5 mg by mouth daily.   Yes [provider]  polyethylene glycol (MIRALAX  / GLYCOLAX ) 17 g packet Take 17 g by mouth 2 (two) times daily. Patient taking differently: Take 17 g by mouth in the morning and at bedtime. 12/31/20  Yes Regalado, Belkys A, MD  predniSONE  (DELTASONE ) 5 MG tablet Take 5 mg by mouth daily.   Yes [provider]  Psyllium (REGULOID PO) Take 0.4 g by mouth at bedtime.   Yes [provider]  cefadroxil  (DURICEF) 500 MG capsule Take 1,000 mg by mouth 2 (two) times daily. Patient not taking: Reported on 03/01/2024    [provider]    Allergies: Iodinated contrast media, Abilify [aripiprazole], Ultram [tramadol hcl], and Zithromax [azithromycin]    Review of Systems  Musculoskeletal:  Positive for arthralgias.  Neurological:  Positive for headaches.    Updated Vital Signs BP 128/67   Pulse 82   Temp 98.3 F (36.8 C) (Oral)   Resp (!) 24   SpO2 96%   Physical Exam Constitutional:      General: She is not in acute distress. HENT:     Head: Normocephalic.     Comments: 2-3 cm hematoma on forehead with surrounding edema and tenderness. No active drainage or bleeding.  Cardiovascular:     Rate and Rhythm: Normal rate and regular rhythm.  Pulmonary:     Effort: Pulmonary effort is normal.  Abdominal:     General: Abdomen is flat.     Palpations: Abdomen is soft.  Musculoskeletal:        General: Tenderness (Of R shoulder) present.  Skin:    General: Skin is warm and dry.     Findings: Bruising (Across R shoulder) present.  Neurological:     General: No focal deficit present.     Mental Status: She is oriented to person, place, and time.     Cranial Nerves: No cranial nerve deficit.     Motor: No weakness.     (all labs ordered are listed, but only abnormal results are displayed) Labs Reviewed  COMPREHENSIVE METABOLIC PANEL WITH GFR - Abnormal; Notable for  the following components:      Result Value   Glucose, Bld 118 (*)    Creatinine, Ser 1.21 (*)    Calcium  8.3 (*)    Total Protein 6.2 (*)    Albumin  2.9 (*)    GFR, Estimated 46 (*)    All other components within normal limits  CBC WITH DIFFERENTIAL/PLATELET - Abnormal; Notable for the following components:   RBC 3.47 (*)    Hemoglobin 9.7 (*)    HCT 32.6 (*)    MCHC 29.8 (*)    All other components within normal limits  CK - Abnormal; Notable for the following components:   Total CK 291 (*)    All other components within normal limits  BRAIN  NATRIURETIC PEPTIDE - Abnormal; Notable for the following components:   B Natriuretic Peptide 259.7 (*)    All other components within normal limits  CBG MONITORING, ED - Abnormal; Notable for the following components:   Glucose-Capillary 164 (*)    All other components within normal limits  TROPONIN I (HIGH SENSITIVITY)  TROPONIN I (HIGH SENSITIVITY)    EKG: EKG Interpretation Date/Time:  Tuesday March 01 2024 11:00:01 EDT Ventricular Rate:  79 PR Interval:  124 QRS Duration:  85 QT Interval:  418 QTC Calculation: 480 R Axis:   9  Text Interpretation: Sinus rhythm Abnormal R-wave progression, early transition Minimal ST depression, anterolateral leads No significant change since last tracing Confirmed by Patt Alm DEL [phone]) on 03/01/2024 12:17:48 PM  Radiology: CT HEAD WO CONTRAST ( ) Result Date: 03/01/2024 CLINICAL DATA:  Head trauma, intracranial venous injury suspected; Neck trauma (Age >= 65y) EXAM: CT HEAD WITHOUT CONTRAST CT CERVICAL SPINE WITHOUT CONTRAST TECHNIQUE: Multidetector CT imaging of the head and cervical spine was performed following the standard protocol without intravenous contrast. Multiplanar CT image reconstructions of the cervical spine were also generated. RADIATION DOSE REDUCTION: This exam was performed according to the departmental dose-optimization program which includes automated exposure control,  adjustment of the mA and/or kV according to patient size and/or use of iterative reconstruction technique. COMPARISON:  None Available. FINDINGS: CT HEAD FINDINGS Brain: no evidence of acute infarction, hemorrhage, hydrocephalus, extra-axial collection or mass lesion/mass effect. Patchy white matter hypodensities are compatible with chronic microvascular ischemic change. Vascular: Calcific atherosclerosis. Skull: No acute fracture.  Right forehead contusion.  Probable Sinuses/Orbits: No acute fracture. Other: No mastoid effusions.  Listen do not undo and scratch CT CERVICAL SPINE FINDINGS Alignment: Normal. Skull base and vertebrae: C3-C7 ACDF. No evidence of fusion at this site of interbody fusion at C3-C4. Bony fusion is solid at C4-C7. No evidence of acute fracture. Vertebral body heights are maintained. Soft tissues and spinal canal: No prevertebral fluid or swelling. No visible canal hematoma. Disc levels:  Similar multilevel degenerative change. Upper chest: Lung apices are clear. Other: Approximately 3.5 cm right thyroid  nodule. IMPRESSION: 1. No evidence of acute abnormality the intracranially or in the cervical spine. 2. Right forehead contusion. 3. Approximately 3.5 cm right thyroid  nodule. If not previously performed, recommend thyroid  ultrasound (ref: J Am Coll Radiol. 2015 Feb;12(2): 143-50). Electronically Signed   By: Gilmore GORMAN Molt M.D.   On: 03/01/2024 14:59   CT Cervical Spine Wo Contrast Result Date: 03/01/2024 CLINICAL DATA:  Head trauma, intracranial venous injury suspected; Neck trauma (Age >= 65y) EXAM: CT HEAD WITHOUT CONTRAST CT CERVICAL SPINE WITHOUT CONTRAST TECHNIQUE: Multidetector CT imaging of the head and cervical spine was performed following the standard protocol without intravenous contrast. Multiplanar CT image reconstructions of the cervical spine were also generated. RADIATION DOSE REDUCTION: This exam was performed according to the departmental dose-optimization program  which includes automated exposure control, adjustment of the mA and/or kV according to patient size and/or use of iterative reconstruction technique. COMPARISON:  None Available. FINDINGS: CT HEAD FINDINGS Brain: no evidence of acute infarction, hemorrhage, hydrocephalus, extra-axial collection or mass lesion/mass effect. Patchy white matter hypodensities are compatible with chronic microvascular ischemic change. Vascular: Calcific atherosclerosis. Skull: No acute fracture.  Right forehead contusion.  Probable Sinuses/Orbits: No acute fracture. Other: No mastoid effusions.  Listen do not undo and scratch CT CERVICAL SPINE FINDINGS Alignment: Normal. Skull base and vertebrae: C3-C7 ACDF. No evidence of fusion at this site of interbody fusion at  C3-C4. Bony fusion is solid at C4-C7. No evidence of acute fracture. Vertebral body heights are maintained. Soft tissues and spinal canal: No prevertebral fluid or swelling. No visible canal hematoma. Disc levels:  Similar multilevel degenerative change. Upper chest: Lung apices are clear. Other: Approximately 3.5 cm right thyroid  nodule. IMPRESSION: 1. No evidence of acute abnormality the intracranially or in the cervical spine. 2. Right forehead contusion. 3. Approximately 3.5 cm right thyroid  nodule. If not previously performed, recommend thyroid  ultrasound (ref: J Am Coll Radiol. 2015 Feb;12(2): 143-50). Electronically Signed   By: Gilmore GORMAN Molt M.D.   On: 03/01/2024 14:59   DG Shoulder Right Port Result Date: 03/01/2024 CLINICAL DATA:  Status post fall EXAM: RIGHT SHOULDER - 1 VIEW COMPARISON:  None Available. FINDINGS: There is no evidence of fracture or dislocation. There is no evidence of arthropathy or other focal bone abnormality. Mild soft tissue swelling and increased density/synovial thickening at the acromioclavicular joint . Status post ACDF.  Visualized lungs is unremarkable. IMPRESSION: No fracture or dislocation. Mild soft tissue swelling overlying the  acromioclavicular joint. Electronically Signed   By: Megan  Zare M.D.   On: 03/01/2024 14:53   DG Chest Port 1 View Result Date: 03/01/2024 CLINICAL DATA:  Fall. EXAM: PORTABLE CHEST 1 VIEW COMPARISON:  01/27/2024. FINDINGS: Low lung volumes. The heart size and mediastinal contours are within normal limits. Retrocardiac opacity, new since the prior exam, could reflect atelectasis or infiltrate. No large pleural effusion. Bibasilar linear atelectasis/scarring. No pneumothorax. No acute osseous abnormality. IMPRESSION: 1. Low lung volumes with increased retrocardiac opacity, which could reflect atelectasis or infiltrate. 2. Bibasilar linear atelectasis/scarring. Electronically Signed   By: Harrietta Sherry M.D.   On: 03/01/2024 12:32    Medications Ordered in the ED  ALPRAZolam  (XANAX ) tablet 0.25 mg (0.25 mg Oral Given 03/01/24 1209)  diazepam  (VALIUM ) injection 2.5 mg (2.5 mg Intravenous Given 03/01/24 1409)    Medical Decision Making Amount and/or Complexity of Data Reviewed Labs: ordered. Radiology: ordered.  Risk Prescription drug management.   ZYANNA LEISINGER is a 78 y.o. female is here following fall.    Lab Tests:  CBC: Hgb 9.7, WBC 9 CMP: Cr 1.21, LFT WNL CK 291 Troponin 16 BNP 259.7  EKG unchanged from prior  Imaging Studies ordered:   CXR: Low lung volumes, bibasilar atelectasis  R shoulder DG: No fracture or dislocation. Mild soft tissue swelling over AC joint.  CT Head: R forehead contusion. No acute intracranial findings.  CT C spine: No acute abnormality.   Medicines ordered:  Xanax  0.25 x1 Valium  2.5 x1   Plan Workup overall reassuring today. CXR, R shoulder DG, and CT head & C-spine were overall negative for acute changes. Workup reassuring against ACS and ADHF. Patient was given Xanax  and Valium  during ED course for anxiety with imaging. Expect forehead contusion and soft tissue R shoulder changes to improve with supportive measures. Patient overall stable  for discharge back to living facility, recommend continued fall prevention measures.   Final diagnoses:  Fall, initial encounter    ED Discharge Orders     None          Diona Perkins, MD 03/01/24 1559    Patt Alm Macho, MD 03/01/24 [phone]

## 2024-03-01 NOTE — ED Notes (Addendum)
 Left message for call back for report

## 2024-03-02 DIAGNOSIS — G3184 Mild cognitive impairment, so stated: Secondary | ICD-10-CM | POA: Diagnosis not present

## 2024-03-02 DIAGNOSIS — E1122 Type 2 diabetes mellitus with diabetic chronic kidney disease: Secondary | ICD-10-CM | POA: Diagnosis not present

## 2024-03-02 DIAGNOSIS — N183 Chronic kidney disease, stage 3 unspecified: Secondary | ICD-10-CM | POA: Diagnosis not present

## 2024-03-02 DIAGNOSIS — E559 Vitamin D deficiency, unspecified: Secondary | ICD-10-CM | POA: Diagnosis not present

## 2024-03-02 DIAGNOSIS — M4800 Spinal stenosis, site unspecified: Secondary | ICD-10-CM | POA: Diagnosis not present

## 2024-03-02 DIAGNOSIS — L03116 Cellulitis of left lower limb: Secondary | ICD-10-CM | POA: Diagnosis not present

## 2024-03-02 DIAGNOSIS — L97812 Non-pressure chronic ulcer of other part of right lower leg with fat layer exposed: Secondary | ICD-10-CM | POA: Diagnosis not present

## 2024-03-02 DIAGNOSIS — I872 Venous insufficiency (chronic) (peripheral): Secondary | ICD-10-CM | POA: Diagnosis not present

## 2024-03-02 DIAGNOSIS — E785 Hyperlipidemia, unspecified: Secondary | ICD-10-CM | POA: Diagnosis not present

## 2024-03-02 DIAGNOSIS — M069 Rheumatoid arthritis, unspecified: Secondary | ICD-10-CM | POA: Diagnosis not present

## 2024-03-02 DIAGNOSIS — I5032 Chronic diastolic (congestive) heart failure: Secondary | ICD-10-CM | POA: Diagnosis not present

## 2024-03-02 DIAGNOSIS — L03115 Cellulitis of right lower limb: Secondary | ICD-10-CM | POA: Diagnosis not present

## 2024-03-02 DIAGNOSIS — K5900 Constipation, unspecified: Secondary | ICD-10-CM | POA: Diagnosis not present

## 2024-03-02 DIAGNOSIS — D631 Anemia in chronic kidney disease: Secondary | ICD-10-CM | POA: Diagnosis not present

## 2024-03-02 DIAGNOSIS — E1151 Type 2 diabetes mellitus with diabetic peripheral angiopathy without gangrene: Secondary | ICD-10-CM | POA: Diagnosis not present

## 2024-03-02 DIAGNOSIS — J309 Allergic rhinitis, unspecified: Secondary | ICD-10-CM | POA: Diagnosis not present

## 2024-03-02 DIAGNOSIS — G4700 Insomnia, unspecified: Secondary | ICD-10-CM | POA: Diagnosis not present

## 2024-03-02 DIAGNOSIS — I130 Hypertensive heart and chronic kidney disease with heart failure and stage 1 through stage 4 chronic kidney disease, or unspecified chronic kidney disease: Secondary | ICD-10-CM | POA: Diagnosis not present

## 2024-03-02 DIAGNOSIS — L97822 Non-pressure chronic ulcer of other part of left lower leg with fat layer exposed: Secondary | ICD-10-CM | POA: Diagnosis not present

## 2024-03-04 DIAGNOSIS — I509 Heart failure, unspecified: Secondary | ICD-10-CM | POA: Diagnosis not present

## 2024-03-04 DIAGNOSIS — I739 Peripheral vascular disease, unspecified: Secondary | ICD-10-CM | POA: Diagnosis not present

## 2024-03-04 DIAGNOSIS — N1831 Chronic kidney disease, stage 3a: Secondary | ICD-10-CM | POA: Diagnosis not present

## 2024-03-04 DIAGNOSIS — I130 Hypertensive heart and chronic kidney disease with heart failure and stage 1 through stage 4 chronic kidney disease, or unspecified chronic kidney disease: Secondary | ICD-10-CM | POA: Diagnosis not present

## 2024-03-07 DIAGNOSIS — Z993 Dependence on wheelchair: Secondary | ICD-10-CM | POA: Diagnosis not present

## 2024-03-07 DIAGNOSIS — N183 Chronic kidney disease, stage 3 unspecified: Secondary | ICD-10-CM | POA: Diagnosis not present

## 2024-03-07 DIAGNOSIS — M4800 Spinal stenosis, site unspecified: Secondary | ICD-10-CM | POA: Diagnosis not present

## 2024-03-07 DIAGNOSIS — G3184 Mild cognitive impairment, so stated: Secondary | ICD-10-CM | POA: Diagnosis not present

## 2024-03-07 DIAGNOSIS — E559 Vitamin D deficiency, unspecified: Secondary | ICD-10-CM | POA: Diagnosis not present

## 2024-03-07 DIAGNOSIS — I5032 Chronic diastolic (congestive) heart failure: Secondary | ICD-10-CM | POA: Diagnosis not present

## 2024-03-07 DIAGNOSIS — M069 Rheumatoid arthritis, unspecified: Secondary | ICD-10-CM | POA: Diagnosis not present

## 2024-03-07 DIAGNOSIS — G4700 Insomnia, unspecified: Secondary | ICD-10-CM | POA: Diagnosis not present

## 2024-03-07 DIAGNOSIS — D631 Anemia in chronic kidney disease: Secondary | ICD-10-CM | POA: Diagnosis not present

## 2024-03-07 DIAGNOSIS — L97822 Non-pressure chronic ulcer of other part of left lower leg with fat layer exposed: Secondary | ICD-10-CM | POA: Diagnosis not present

## 2024-03-07 DIAGNOSIS — L03116 Cellulitis of left lower limb: Secondary | ICD-10-CM | POA: Diagnosis not present

## 2024-03-07 DIAGNOSIS — E1122 Type 2 diabetes mellitus with diabetic chronic kidney disease: Secondary | ICD-10-CM | POA: Diagnosis not present

## 2024-03-07 DIAGNOSIS — I872 Venous insufficiency (chronic) (peripheral): Secondary | ICD-10-CM | POA: Diagnosis not present

## 2024-03-07 DIAGNOSIS — E785 Hyperlipidemia, unspecified: Secondary | ICD-10-CM | POA: Diagnosis not present

## 2024-03-07 DIAGNOSIS — E1151 Type 2 diabetes mellitus with diabetic peripheral angiopathy without gangrene: Secondary | ICD-10-CM | POA: Diagnosis not present

## 2024-03-07 DIAGNOSIS — J309 Allergic rhinitis, unspecified: Secondary | ICD-10-CM | POA: Diagnosis not present

## 2024-03-07 DIAGNOSIS — K5900 Constipation, unspecified: Secondary | ICD-10-CM | POA: Diagnosis not present

## 2024-03-07 DIAGNOSIS — I130 Hypertensive heart and chronic kidney disease with heart failure and stage 1 through stage 4 chronic kidney disease, or unspecified chronic kidney disease: Secondary | ICD-10-CM | POA: Diagnosis not present

## 2024-03-07 DIAGNOSIS — L97812 Non-pressure chronic ulcer of other part of right lower leg with fat layer exposed: Secondary | ICD-10-CM | POA: Diagnosis not present

## 2024-03-08 DIAGNOSIS — I130 Hypertensive heart and chronic kidney disease with heart failure and stage 1 through stage 4 chronic kidney disease, or unspecified chronic kidney disease: Secondary | ICD-10-CM | POA: Diagnosis not present

## 2024-03-08 DIAGNOSIS — L03116 Cellulitis of left lower limb: Secondary | ICD-10-CM | POA: Diagnosis not present

## 2024-03-08 DIAGNOSIS — G3184 Mild cognitive impairment, so stated: Secondary | ICD-10-CM | POA: Diagnosis not present

## 2024-03-08 DIAGNOSIS — E1151 Type 2 diabetes mellitus with diabetic peripheral angiopathy without gangrene: Secondary | ICD-10-CM | POA: Diagnosis not present

## 2024-03-08 DIAGNOSIS — Z1329 Encounter for screening for other suspected endocrine disorder: Secondary | ICD-10-CM | POA: Diagnosis not present

## 2024-03-08 DIAGNOSIS — L97822 Non-pressure chronic ulcer of other part of left lower leg with fat layer exposed: Secondary | ICD-10-CM | POA: Diagnosis not present

## 2024-03-08 DIAGNOSIS — E1122 Type 2 diabetes mellitus with diabetic chronic kidney disease: Secondary | ICD-10-CM | POA: Diagnosis not present

## 2024-03-08 DIAGNOSIS — I5032 Chronic diastolic (congestive) heart failure: Secondary | ICD-10-CM | POA: Diagnosis not present

## 2024-03-08 DIAGNOSIS — I10 Essential (primary) hypertension: Secondary | ICD-10-CM | POA: Diagnosis not present

## 2024-03-08 DIAGNOSIS — D631 Anemia in chronic kidney disease: Secondary | ICD-10-CM | POA: Diagnosis not present

## 2024-03-08 DIAGNOSIS — K5900 Constipation, unspecified: Secondary | ICD-10-CM | POA: Diagnosis not present

## 2024-03-08 DIAGNOSIS — Z131 Encounter for screening for diabetes mellitus: Secondary | ICD-10-CM | POA: Diagnosis not present

## 2024-03-08 DIAGNOSIS — L97812 Non-pressure chronic ulcer of other part of right lower leg with fat layer exposed: Secondary | ICD-10-CM | POA: Diagnosis not present

## 2024-03-08 DIAGNOSIS — E785 Hyperlipidemia, unspecified: Secondary | ICD-10-CM | POA: Diagnosis not present

## 2024-03-08 DIAGNOSIS — E559 Vitamin D deficiency, unspecified: Secondary | ICD-10-CM | POA: Diagnosis not present

## 2024-03-08 DIAGNOSIS — G4700 Insomnia, unspecified: Secondary | ICD-10-CM | POA: Diagnosis not present

## 2024-03-08 DIAGNOSIS — N183 Chronic kidney disease, stage 3 unspecified: Secondary | ICD-10-CM | POA: Diagnosis not present

## 2024-03-08 DIAGNOSIS — L03115 Cellulitis of right lower limb: Secondary | ICD-10-CM | POA: Diagnosis not present

## 2024-03-08 DIAGNOSIS — M4800 Spinal stenosis, site unspecified: Secondary | ICD-10-CM | POA: Diagnosis not present

## 2024-03-08 DIAGNOSIS — Z993 Dependence on wheelchair: Secondary | ICD-10-CM | POA: Diagnosis not present

## 2024-03-08 DIAGNOSIS — J309 Allergic rhinitis, unspecified: Secondary | ICD-10-CM | POA: Diagnosis not present

## 2024-03-08 DIAGNOSIS — M069 Rheumatoid arthritis, unspecified: Secondary | ICD-10-CM | POA: Diagnosis not present

## 2024-03-08 DIAGNOSIS — I872 Venous insufficiency (chronic) (peripheral): Secondary | ICD-10-CM | POA: Diagnosis not present

## 2024-03-10 DIAGNOSIS — E119 Type 2 diabetes mellitus without complications: Secondary | ICD-10-CM | POA: Diagnosis not present

## 2024-03-15 DIAGNOSIS — Z993 Dependence on wheelchair: Secondary | ICD-10-CM | POA: Diagnosis not present

## 2024-03-15 DIAGNOSIS — L03116 Cellulitis of left lower limb: Secondary | ICD-10-CM | POA: Diagnosis not present

## 2024-03-15 DIAGNOSIS — M4800 Spinal stenosis, site unspecified: Secondary | ICD-10-CM | POA: Diagnosis not present

## 2024-03-15 DIAGNOSIS — L97812 Non-pressure chronic ulcer of other part of right lower leg with fat layer exposed: Secondary | ICD-10-CM | POA: Diagnosis not present

## 2024-03-15 DIAGNOSIS — M069 Rheumatoid arthritis, unspecified: Secondary | ICD-10-CM | POA: Diagnosis not present

## 2024-03-15 DIAGNOSIS — G3184 Mild cognitive impairment, so stated: Secondary | ICD-10-CM | POA: Diagnosis not present

## 2024-03-15 DIAGNOSIS — E559 Vitamin D deficiency, unspecified: Secondary | ICD-10-CM | POA: Diagnosis not present

## 2024-03-15 DIAGNOSIS — E1122 Type 2 diabetes mellitus with diabetic chronic kidney disease: Secondary | ICD-10-CM | POA: Diagnosis not present

## 2024-03-15 DIAGNOSIS — E785 Hyperlipidemia, unspecified: Secondary | ICD-10-CM | POA: Diagnosis not present

## 2024-03-15 DIAGNOSIS — L03115 Cellulitis of right lower limb: Secondary | ICD-10-CM | POA: Diagnosis not present

## 2024-03-15 DIAGNOSIS — I130 Hypertensive heart and chronic kidney disease with heart failure and stage 1 through stage 4 chronic kidney disease, or unspecified chronic kidney disease: Secondary | ICD-10-CM | POA: Diagnosis not present

## 2024-03-15 DIAGNOSIS — G4700 Insomnia, unspecified: Secondary | ICD-10-CM | POA: Diagnosis not present

## 2024-03-15 DIAGNOSIS — I872 Venous insufficiency (chronic) (peripheral): Secondary | ICD-10-CM | POA: Diagnosis not present

## 2024-03-15 DIAGNOSIS — D631 Anemia in chronic kidney disease: Secondary | ICD-10-CM | POA: Diagnosis not present

## 2024-03-15 DIAGNOSIS — L97822 Non-pressure chronic ulcer of other part of left lower leg with fat layer exposed: Secondary | ICD-10-CM | POA: Diagnosis not present

## 2024-03-15 DIAGNOSIS — N183 Chronic kidney disease, stage 3 unspecified: Secondary | ICD-10-CM | POA: Diagnosis not present

## 2024-03-15 DIAGNOSIS — K5900 Constipation, unspecified: Secondary | ICD-10-CM | POA: Diagnosis not present

## 2024-03-15 DIAGNOSIS — E1151 Type 2 diabetes mellitus with diabetic peripheral angiopathy without gangrene: Secondary | ICD-10-CM | POA: Diagnosis not present

## 2024-03-15 DIAGNOSIS — J309 Allergic rhinitis, unspecified: Secondary | ICD-10-CM | POA: Diagnosis not present

## 2024-03-15 DIAGNOSIS — I5032 Chronic diastolic (congestive) heart failure: Secondary | ICD-10-CM | POA: Diagnosis not present

## 2024-03-21 NOTE — Progress Notes (Signed)
 BNP ordered since patient has history of heart failure

## 2024-03-22 DIAGNOSIS — L03115 Cellulitis of right lower limb: Secondary | ICD-10-CM | POA: Diagnosis not present

## 2024-03-22 DIAGNOSIS — I130 Hypertensive heart and chronic kidney disease with heart failure and stage 1 through stage 4 chronic kidney disease, or unspecified chronic kidney disease: Secondary | ICD-10-CM | POA: Diagnosis not present

## 2024-03-22 DIAGNOSIS — G4700 Insomnia, unspecified: Secondary | ICD-10-CM | POA: Diagnosis not present

## 2024-03-22 DIAGNOSIS — I5032 Chronic diastolic (congestive) heart failure: Secondary | ICD-10-CM | POA: Diagnosis not present

## 2024-03-22 DIAGNOSIS — L03116 Cellulitis of left lower limb: Secondary | ICD-10-CM | POA: Diagnosis not present

## 2024-03-22 DIAGNOSIS — N183 Chronic kidney disease, stage 3 unspecified: Secondary | ICD-10-CM | POA: Diagnosis not present

## 2024-03-22 DIAGNOSIS — E785 Hyperlipidemia, unspecified: Secondary | ICD-10-CM | POA: Diagnosis not present

## 2024-03-22 DIAGNOSIS — G3184 Mild cognitive impairment, so stated: Secondary | ICD-10-CM | POA: Diagnosis not present

## 2024-03-22 DIAGNOSIS — Z993 Dependence on wheelchair: Secondary | ICD-10-CM | POA: Diagnosis not present

## 2024-03-22 DIAGNOSIS — M069 Rheumatoid arthritis, unspecified: Secondary | ICD-10-CM | POA: Diagnosis not present

## 2024-03-22 DIAGNOSIS — E1122 Type 2 diabetes mellitus with diabetic chronic kidney disease: Secondary | ICD-10-CM | POA: Diagnosis not present

## 2024-03-22 DIAGNOSIS — E1151 Type 2 diabetes mellitus with diabetic peripheral angiopathy without gangrene: Secondary | ICD-10-CM | POA: Diagnosis not present

## 2024-03-22 DIAGNOSIS — L97812 Non-pressure chronic ulcer of other part of right lower leg with fat layer exposed: Secondary | ICD-10-CM | POA: Diagnosis not present

## 2024-03-22 DIAGNOSIS — I872 Venous insufficiency (chronic) (peripheral): Secondary | ICD-10-CM | POA: Diagnosis not present

## 2024-03-22 DIAGNOSIS — L97822 Non-pressure chronic ulcer of other part of left lower leg with fat layer exposed: Secondary | ICD-10-CM | POA: Diagnosis not present

## 2024-03-22 DIAGNOSIS — D631 Anemia in chronic kidney disease: Secondary | ICD-10-CM | POA: Diagnosis not present

## 2024-03-22 DIAGNOSIS — K5900 Constipation, unspecified: Secondary | ICD-10-CM | POA: Diagnosis not present

## 2024-03-22 DIAGNOSIS — J309 Allergic rhinitis, unspecified: Secondary | ICD-10-CM | POA: Diagnosis not present

## 2024-03-22 DIAGNOSIS — M4800 Spinal stenosis, site unspecified: Secondary | ICD-10-CM | POA: Diagnosis not present

## 2024-03-23 DIAGNOSIS — M069 Rheumatoid arthritis, unspecified: Secondary | ICD-10-CM | POA: Diagnosis not present

## 2024-03-28 DIAGNOSIS — D631 Anemia in chronic kidney disease: Secondary | ICD-10-CM | POA: Diagnosis not present

## 2024-03-28 DIAGNOSIS — I5032 Chronic diastolic (congestive) heart failure: Secondary | ICD-10-CM | POA: Diagnosis not present

## 2024-03-28 DIAGNOSIS — G4700 Insomnia, unspecified: Secondary | ICD-10-CM | POA: Diagnosis not present

## 2024-03-28 DIAGNOSIS — E785 Hyperlipidemia, unspecified: Secondary | ICD-10-CM | POA: Diagnosis not present

## 2024-03-28 DIAGNOSIS — N183 Chronic kidney disease, stage 3 unspecified: Secondary | ICD-10-CM | POA: Diagnosis not present

## 2024-03-28 DIAGNOSIS — I872 Venous insufficiency (chronic) (peripheral): Secondary | ICD-10-CM | POA: Diagnosis not present

## 2024-03-28 DIAGNOSIS — L97822 Non-pressure chronic ulcer of other part of left lower leg with fat layer exposed: Secondary | ICD-10-CM | POA: Diagnosis not present

## 2024-03-28 DIAGNOSIS — Z993 Dependence on wheelchair: Secondary | ICD-10-CM | POA: Diagnosis not present

## 2024-03-28 DIAGNOSIS — K5900 Constipation, unspecified: Secondary | ICD-10-CM | POA: Diagnosis not present

## 2024-03-28 DIAGNOSIS — G3184 Mild cognitive impairment, so stated: Secondary | ICD-10-CM | POA: Diagnosis not present

## 2024-03-28 DIAGNOSIS — L03116 Cellulitis of left lower limb: Secondary | ICD-10-CM | POA: Diagnosis not present

## 2024-03-28 DIAGNOSIS — M069 Rheumatoid arthritis, unspecified: Secondary | ICD-10-CM | POA: Diagnosis not present

## 2024-03-28 DIAGNOSIS — E1151 Type 2 diabetes mellitus with diabetic peripheral angiopathy without gangrene: Secondary | ICD-10-CM | POA: Diagnosis not present

## 2024-03-28 DIAGNOSIS — I130 Hypertensive heart and chronic kidney disease with heart failure and stage 1 through stage 4 chronic kidney disease, or unspecified chronic kidney disease: Secondary | ICD-10-CM | POA: Diagnosis not present

## 2024-03-28 DIAGNOSIS — J309 Allergic rhinitis, unspecified: Secondary | ICD-10-CM | POA: Diagnosis not present

## 2024-03-28 DIAGNOSIS — M4800 Spinal stenosis, site unspecified: Secondary | ICD-10-CM | POA: Diagnosis not present

## 2024-03-28 DIAGNOSIS — E559 Vitamin D deficiency, unspecified: Secondary | ICD-10-CM | POA: Diagnosis not present

## 2024-03-28 DIAGNOSIS — E1122 Type 2 diabetes mellitus with diabetic chronic kidney disease: Secondary | ICD-10-CM | POA: Diagnosis not present

## 2024-03-28 DIAGNOSIS — L97812 Non-pressure chronic ulcer of other part of right lower leg with fat layer exposed: Secondary | ICD-10-CM | POA: Diagnosis not present

## 2024-03-30 DIAGNOSIS — D631 Anemia in chronic kidney disease: Secondary | ICD-10-CM | POA: Diagnosis not present

## 2024-03-30 DIAGNOSIS — L03116 Cellulitis of left lower limb: Secondary | ICD-10-CM | POA: Diagnosis not present

## 2024-03-30 DIAGNOSIS — K5900 Constipation, unspecified: Secondary | ICD-10-CM | POA: Diagnosis not present

## 2024-03-30 DIAGNOSIS — I872 Venous insufficiency (chronic) (peripheral): Secondary | ICD-10-CM | POA: Diagnosis not present

## 2024-03-30 DIAGNOSIS — J309 Allergic rhinitis, unspecified: Secondary | ICD-10-CM | POA: Diagnosis not present

## 2024-03-30 DIAGNOSIS — E1151 Type 2 diabetes mellitus with diabetic peripheral angiopathy without gangrene: Secondary | ICD-10-CM | POA: Diagnosis not present

## 2024-03-30 DIAGNOSIS — I130 Hypertensive heart and chronic kidney disease with heart failure and stage 1 through stage 4 chronic kidney disease, or unspecified chronic kidney disease: Secondary | ICD-10-CM | POA: Diagnosis not present

## 2024-03-30 DIAGNOSIS — M069 Rheumatoid arthritis, unspecified: Secondary | ICD-10-CM | POA: Diagnosis not present

## 2024-03-30 DIAGNOSIS — N183 Chronic kidney disease, stage 3 unspecified: Secondary | ICD-10-CM | POA: Diagnosis not present

## 2024-03-30 DIAGNOSIS — G3184 Mild cognitive impairment, so stated: Secondary | ICD-10-CM | POA: Diagnosis not present

## 2024-03-30 DIAGNOSIS — L97812 Non-pressure chronic ulcer of other part of right lower leg with fat layer exposed: Secondary | ICD-10-CM | POA: Diagnosis not present

## 2024-03-30 DIAGNOSIS — E559 Vitamin D deficiency, unspecified: Secondary | ICD-10-CM | POA: Diagnosis not present

## 2024-03-30 DIAGNOSIS — L03115 Cellulitis of right lower limb: Secondary | ICD-10-CM | POA: Diagnosis not present

## 2024-03-30 DIAGNOSIS — M4800 Spinal stenosis, site unspecified: Secondary | ICD-10-CM | POA: Diagnosis not present

## 2024-03-30 DIAGNOSIS — Z993 Dependence on wheelchair: Secondary | ICD-10-CM | POA: Diagnosis not present

## 2024-03-30 DIAGNOSIS — I5032 Chronic diastolic (congestive) heart failure: Secondary | ICD-10-CM | POA: Diagnosis not present

## 2024-03-30 DIAGNOSIS — E785 Hyperlipidemia, unspecified: Secondary | ICD-10-CM | POA: Diagnosis not present

## 2024-03-30 DIAGNOSIS — E1122 Type 2 diabetes mellitus with diabetic chronic kidney disease: Secondary | ICD-10-CM | POA: Diagnosis not present

## 2024-03-30 DIAGNOSIS — G4700 Insomnia, unspecified: Secondary | ICD-10-CM | POA: Diagnosis not present

## 2024-03-30 DIAGNOSIS — L97822 Non-pressure chronic ulcer of other part of left lower leg with fat layer exposed: Secondary | ICD-10-CM | POA: Diagnosis not present

## 2024-04-01 DIAGNOSIS — I10 Essential (primary) hypertension: Secondary | ICD-10-CM | POA: Diagnosis not present

## 2024-04-01 DIAGNOSIS — I872 Venous insufficiency (chronic) (peripheral): Secondary | ICD-10-CM | POA: Diagnosis not present

## 2024-04-01 DIAGNOSIS — N1832 Chronic kidney disease, stage 3b: Secondary | ICD-10-CM | POA: Diagnosis not present

## 2024-04-01 DIAGNOSIS — E119 Type 2 diabetes mellitus without complications: Secondary | ICD-10-CM | POA: Diagnosis not present

## 2024-04-01 DIAGNOSIS — I130 Hypertensive heart and chronic kidney disease with heart failure and stage 1 through stage 4 chronic kidney disease, or unspecified chronic kidney disease: Secondary | ICD-10-CM | POA: Diagnosis not present

## 2024-04-05 DIAGNOSIS — G3184 Mild cognitive impairment, so stated: Secondary | ICD-10-CM | POA: Diagnosis not present

## 2024-04-05 DIAGNOSIS — I5032 Chronic diastolic (congestive) heart failure: Secondary | ICD-10-CM | POA: Diagnosis not present

## 2024-04-05 DIAGNOSIS — K5900 Constipation, unspecified: Secondary | ICD-10-CM | POA: Diagnosis not present

## 2024-04-05 DIAGNOSIS — M069 Rheumatoid arthritis, unspecified: Secondary | ICD-10-CM | POA: Diagnosis not present

## 2024-04-05 DIAGNOSIS — L97822 Non-pressure chronic ulcer of other part of left lower leg with fat layer exposed: Secondary | ICD-10-CM | POA: Diagnosis not present

## 2024-04-05 DIAGNOSIS — D631 Anemia in chronic kidney disease: Secondary | ICD-10-CM | POA: Diagnosis not present

## 2024-04-05 DIAGNOSIS — E785 Hyperlipidemia, unspecified: Secondary | ICD-10-CM | POA: Diagnosis not present

## 2024-04-05 DIAGNOSIS — L03116 Cellulitis of left lower limb: Secondary | ICD-10-CM | POA: Diagnosis not present

## 2024-04-05 DIAGNOSIS — E1151 Type 2 diabetes mellitus with diabetic peripheral angiopathy without gangrene: Secondary | ICD-10-CM | POA: Diagnosis not present

## 2024-04-05 DIAGNOSIS — N183 Chronic kidney disease, stage 3 unspecified: Secondary | ICD-10-CM | POA: Diagnosis not present

## 2024-04-05 DIAGNOSIS — E559 Vitamin D deficiency, unspecified: Secondary | ICD-10-CM | POA: Diagnosis not present

## 2024-04-05 DIAGNOSIS — J309 Allergic rhinitis, unspecified: Secondary | ICD-10-CM | POA: Diagnosis not present

## 2024-04-05 DIAGNOSIS — I872 Venous insufficiency (chronic) (peripheral): Secondary | ICD-10-CM | POA: Diagnosis not present

## 2024-04-05 DIAGNOSIS — I130 Hypertensive heart and chronic kidney disease with heart failure and stage 1 through stage 4 chronic kidney disease, or unspecified chronic kidney disease: Secondary | ICD-10-CM | POA: Diagnosis not present

## 2024-04-05 DIAGNOSIS — L97812 Non-pressure chronic ulcer of other part of right lower leg with fat layer exposed: Secondary | ICD-10-CM | POA: Diagnosis not present

## 2024-04-05 DIAGNOSIS — L03115 Cellulitis of right lower limb: Secondary | ICD-10-CM | POA: Diagnosis not present

## 2024-04-05 DIAGNOSIS — E1122 Type 2 diabetes mellitus with diabetic chronic kidney disease: Secondary | ICD-10-CM | POA: Diagnosis not present

## 2024-04-05 DIAGNOSIS — Z993 Dependence on wheelchair: Secondary | ICD-10-CM | POA: Diagnosis not present

## 2024-04-05 DIAGNOSIS — G4700 Insomnia, unspecified: Secondary | ICD-10-CM | POA: Diagnosis not present

## 2024-04-05 DIAGNOSIS — M4800 Spinal stenosis, site unspecified: Secondary | ICD-10-CM | POA: Diagnosis not present

## 2024-04-07 DIAGNOSIS — M4800 Spinal stenosis, site unspecified: Secondary | ICD-10-CM | POA: Diagnosis not present

## 2024-04-07 DIAGNOSIS — I130 Hypertensive heart and chronic kidney disease with heart failure and stage 1 through stage 4 chronic kidney disease, or unspecified chronic kidney disease: Secondary | ICD-10-CM | POA: Diagnosis not present

## 2024-04-07 DIAGNOSIS — Z993 Dependence on wheelchair: Secondary | ICD-10-CM | POA: Diagnosis not present

## 2024-04-07 DIAGNOSIS — L97812 Non-pressure chronic ulcer of other part of right lower leg with fat layer exposed: Secondary | ICD-10-CM | POA: Diagnosis not present

## 2024-04-07 DIAGNOSIS — D631 Anemia in chronic kidney disease: Secondary | ICD-10-CM | POA: Diagnosis not present

## 2024-04-07 DIAGNOSIS — E785 Hyperlipidemia, unspecified: Secondary | ICD-10-CM | POA: Diagnosis not present

## 2024-04-07 DIAGNOSIS — E1122 Type 2 diabetes mellitus with diabetic chronic kidney disease: Secondary | ICD-10-CM | POA: Diagnosis not present

## 2024-04-07 DIAGNOSIS — N183 Chronic kidney disease, stage 3 unspecified: Secondary | ICD-10-CM | POA: Diagnosis not present

## 2024-04-07 DIAGNOSIS — G4700 Insomnia, unspecified: Secondary | ICD-10-CM | POA: Diagnosis not present

## 2024-04-07 DIAGNOSIS — M069 Rheumatoid arthritis, unspecified: Secondary | ICD-10-CM | POA: Diagnosis not present

## 2024-04-07 DIAGNOSIS — G3184 Mild cognitive impairment, so stated: Secondary | ICD-10-CM | POA: Diagnosis not present

## 2024-04-07 DIAGNOSIS — J309 Allergic rhinitis, unspecified: Secondary | ICD-10-CM | POA: Diagnosis not present

## 2024-04-07 DIAGNOSIS — K5900 Constipation, unspecified: Secondary | ICD-10-CM | POA: Diagnosis not present

## 2024-04-07 DIAGNOSIS — L03115 Cellulitis of right lower limb: Secondary | ICD-10-CM | POA: Diagnosis not present

## 2024-04-07 DIAGNOSIS — I5032 Chronic diastolic (congestive) heart failure: Secondary | ICD-10-CM | POA: Diagnosis not present

## 2024-04-07 DIAGNOSIS — E559 Vitamin D deficiency, unspecified: Secondary | ICD-10-CM | POA: Diagnosis not present

## 2024-04-07 DIAGNOSIS — L03116 Cellulitis of left lower limb: Secondary | ICD-10-CM | POA: Diagnosis not present

## 2024-04-07 DIAGNOSIS — E1151 Type 2 diabetes mellitus with diabetic peripheral angiopathy without gangrene: Secondary | ICD-10-CM | POA: Diagnosis not present

## 2024-04-07 DIAGNOSIS — I872 Venous insufficiency (chronic) (peripheral): Secondary | ICD-10-CM | POA: Diagnosis not present

## 2024-04-07 DIAGNOSIS — L97822 Non-pressure chronic ulcer of other part of left lower leg with fat layer exposed: Secondary | ICD-10-CM | POA: Diagnosis not present

## 2024-04-12 DIAGNOSIS — G4700 Insomnia, unspecified: Secondary | ICD-10-CM | POA: Diagnosis not present

## 2024-04-12 DIAGNOSIS — Z993 Dependence on wheelchair: Secondary | ICD-10-CM | POA: Diagnosis not present

## 2024-04-12 DIAGNOSIS — N183 Chronic kidney disease, stage 3 unspecified: Secondary | ICD-10-CM | POA: Diagnosis not present

## 2024-04-12 DIAGNOSIS — E559 Vitamin D deficiency, unspecified: Secondary | ICD-10-CM | POA: Diagnosis not present

## 2024-04-12 DIAGNOSIS — M069 Rheumatoid arthritis, unspecified: Secondary | ICD-10-CM | POA: Diagnosis not present

## 2024-04-12 DIAGNOSIS — E785 Hyperlipidemia, unspecified: Secondary | ICD-10-CM | POA: Diagnosis not present

## 2024-04-12 DIAGNOSIS — I5032 Chronic diastolic (congestive) heart failure: Secondary | ICD-10-CM | POA: Diagnosis not present

## 2024-04-12 DIAGNOSIS — I872 Venous insufficiency (chronic) (peripheral): Secondary | ICD-10-CM | POA: Diagnosis not present

## 2024-04-12 DIAGNOSIS — I130 Hypertensive heart and chronic kidney disease with heart failure and stage 1 through stage 4 chronic kidney disease, or unspecified chronic kidney disease: Secondary | ICD-10-CM | POA: Diagnosis not present

## 2024-04-12 DIAGNOSIS — E1122 Type 2 diabetes mellitus with diabetic chronic kidney disease: Secondary | ICD-10-CM | POA: Diagnosis not present

## 2024-04-12 DIAGNOSIS — K5900 Constipation, unspecified: Secondary | ICD-10-CM | POA: Diagnosis not present

## 2024-04-12 DIAGNOSIS — L97812 Non-pressure chronic ulcer of other part of right lower leg with fat layer exposed: Secondary | ICD-10-CM | POA: Diagnosis not present

## 2024-04-12 DIAGNOSIS — L03116 Cellulitis of left lower limb: Secondary | ICD-10-CM | POA: Diagnosis not present

## 2024-04-12 DIAGNOSIS — G3184 Mild cognitive impairment, so stated: Secondary | ICD-10-CM | POA: Diagnosis not present

## 2024-04-12 DIAGNOSIS — J309 Allergic rhinitis, unspecified: Secondary | ICD-10-CM | POA: Diagnosis not present

## 2024-04-12 DIAGNOSIS — L03115 Cellulitis of right lower limb: Secondary | ICD-10-CM | POA: Diagnosis not present

## 2024-04-12 DIAGNOSIS — E1151 Type 2 diabetes mellitus with diabetic peripheral angiopathy without gangrene: Secondary | ICD-10-CM | POA: Diagnosis not present

## 2024-04-12 DIAGNOSIS — L97822 Non-pressure chronic ulcer of other part of left lower leg with fat layer exposed: Secondary | ICD-10-CM | POA: Diagnosis not present

## 2024-04-12 DIAGNOSIS — M4800 Spinal stenosis, site unspecified: Secondary | ICD-10-CM | POA: Diagnosis not present

## 2024-04-12 DIAGNOSIS — D631 Anemia in chronic kidney disease: Secondary | ICD-10-CM | POA: Diagnosis not present
